# Patient Record
Sex: Female | Born: 1935
Health system: Southern US, Community
[De-identification: ages and names within clinical notes are randomized; demographics above are authoritative.]

## PROBLEM LIST (undated history)

## (undated) DIAGNOSIS — Z7901 Long term (current) use of anticoagulants: Secondary | ICD-10-CM

## (undated) DIAGNOSIS — L723 Sebaceous cyst: Secondary | ICD-10-CM

## (undated) DIAGNOSIS — M62838 Other muscle spasm: Secondary | ICD-10-CM

## (undated) DIAGNOSIS — R350 Frequency of micturition: Secondary | ICD-10-CM

## (undated) DIAGNOSIS — L989 Disorder of the skin and subcutaneous tissue, unspecified: Secondary | ICD-10-CM

## (undated) DIAGNOSIS — M791 Myalgia, unspecified site: Secondary | ICD-10-CM

## (undated) DIAGNOSIS — E119 Type 2 diabetes mellitus without complications: Secondary | ICD-10-CM

## (undated) DIAGNOSIS — Z5181 Encounter for therapeutic drug level monitoring: Secondary | ICD-10-CM

## (undated) DIAGNOSIS — F419 Anxiety disorder, unspecified: Secondary | ICD-10-CM

## (undated) DIAGNOSIS — K219 Gastro-esophageal reflux disease without esophagitis: Secondary | ICD-10-CM

## (undated) DIAGNOSIS — G4700 Insomnia, unspecified: Secondary | ICD-10-CM

## (undated) DIAGNOSIS — E538 Deficiency of other specified B group vitamins: Secondary | ICD-10-CM

## (undated) DIAGNOSIS — K5900 Constipation, unspecified: Secondary | ICD-10-CM

## (undated) DIAGNOSIS — I82409 Acute embolism and thrombosis of unspecified deep veins of unspecified lower extremity: Secondary | ICD-10-CM

## (undated) DIAGNOSIS — I10 Essential (primary) hypertension: Secondary | ICD-10-CM

## (undated) DIAGNOSIS — E559 Vitamin D deficiency, unspecified: Secondary | ICD-10-CM

## (undated) DIAGNOSIS — E785 Hyperlipidemia, unspecified: Secondary | ICD-10-CM

## (undated) HISTORY — DX: Other muscle spasm: M62.838

## (undated) HISTORY — DX: Sebaceous cyst: L72.3

## (undated) HISTORY — DX: Vitamin D deficiency, unspecified: E55.9

## (undated) HISTORY — DX: Deficiency of other specified B group vitamins: E53.8

## (undated) HISTORY — DX: Essential (primary) hypertension: I10

## (undated) HISTORY — DX: Constipation, unspecified: K59.00

## (undated) HISTORY — DX: Myalgia, unspecified site: M79.10

## (undated) HISTORY — DX: Disorder of the skin and subcutaneous tissue, unspecified: L98.9

## (undated) HISTORY — DX: Gastro-esophageal reflux disease without esophagitis: K21.9

## (undated) HISTORY — DX: Type 2 diabetes mellitus without complications: E11.9

## (undated) HISTORY — DX: Hyperlipidemia, unspecified: E78.5

## (undated) HISTORY — DX: Encounter for therapeutic drug level monitoring: Z51.81

## (undated) HISTORY — PX: ABDOMINAL HYSTERECTOMY: SHX81

## (undated) HISTORY — DX: Frequency of micturition: R35.0

## (undated) HISTORY — DX: Long term (current) use of anticoagulants: Z79.01

## (undated) HISTORY — DX: Acute embolism and thrombosis of unspecified deep veins of unspecified lower extremity: I82.409

## (undated) HISTORY — DX: Anxiety disorder, unspecified: F41.9

## (undated) HISTORY — DX: Insomnia, unspecified: G47.00

---

## 2003-09-22 IMAGING — CR DG CHEST 1V PORT
1 series · 1 of 1 positions shown · non-contrast
Comparison: none

REASON FOR EXAM: Chest pain
COMMENTS:  LMP: Post-Menopausal

PROCEDURE:     DXR - DXR PORTABLE CHEST SINGLE VIEW  - [DATE]  [DATE]
RESULT:          The lungs are clear.  The cardiac silhouette and visualized
bony skeleton are unremarkable.

[view not recorded]
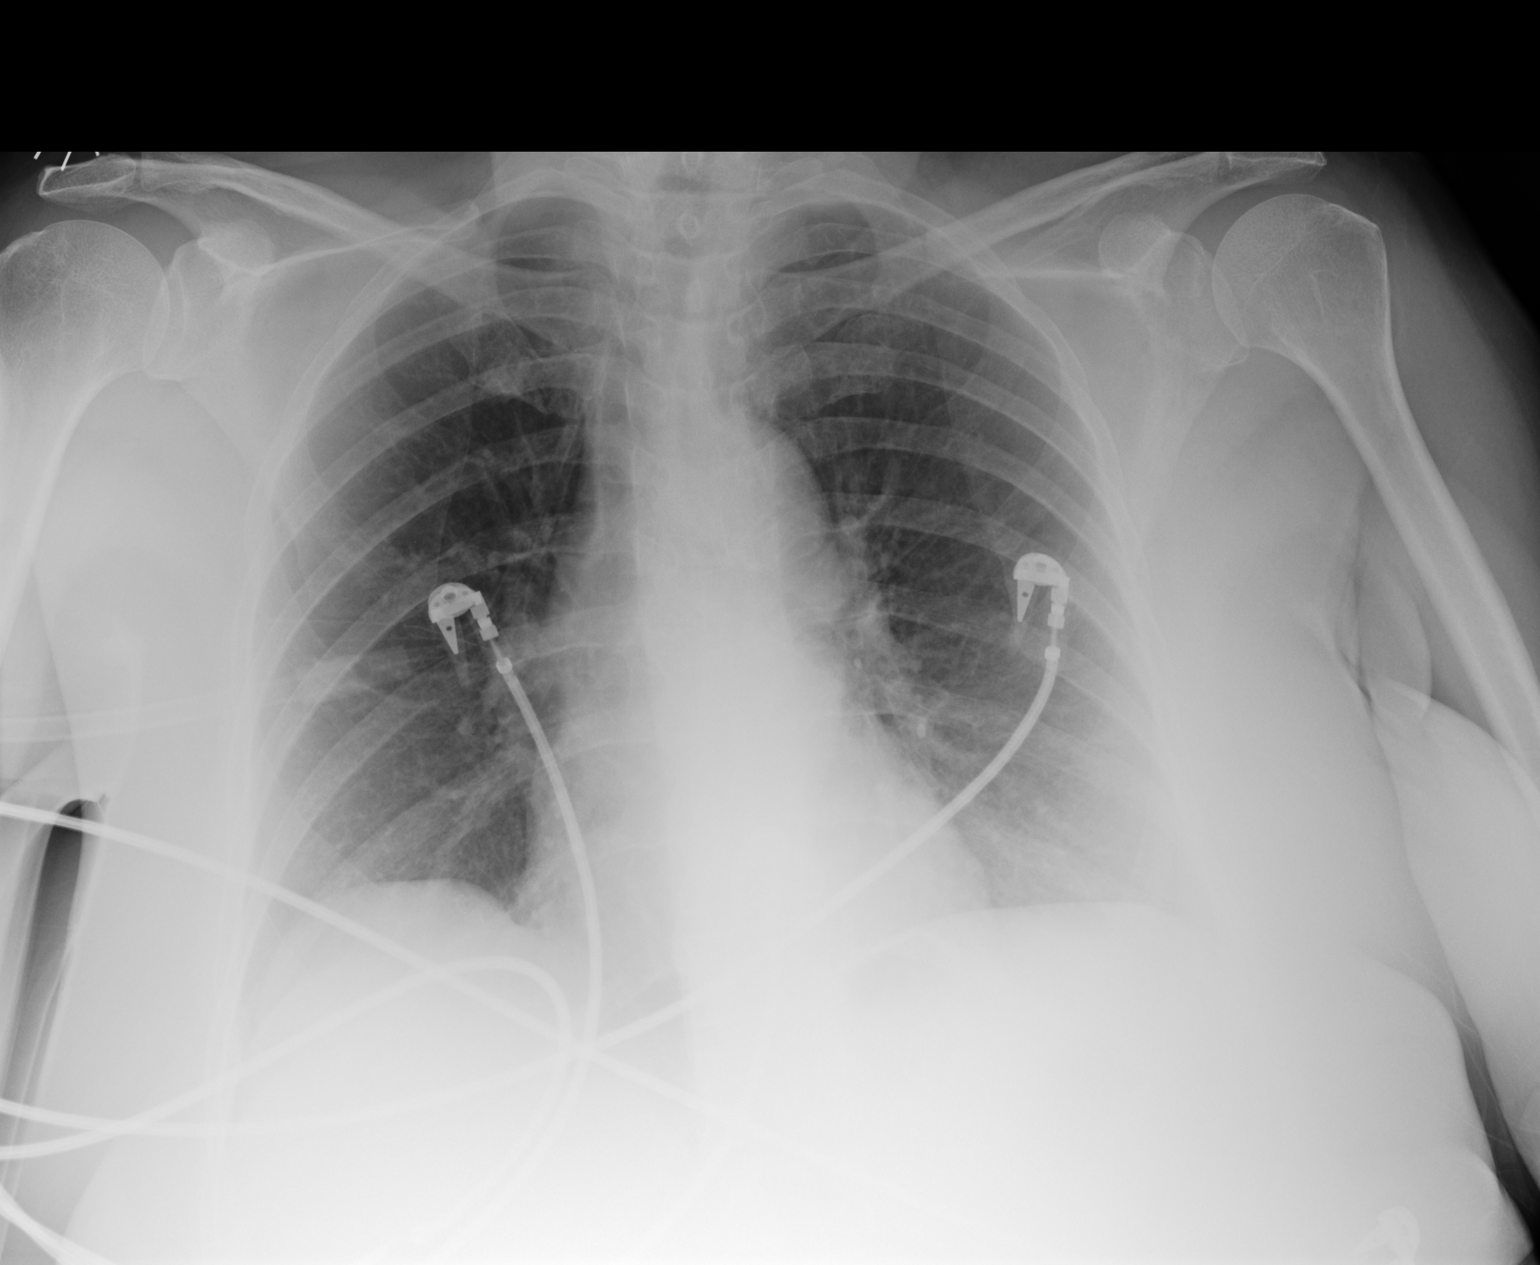

[1 of 1 positions shown; findings below may reference images not displayed]

IMPRESSION: Chest radiograph without evidence of acute
cardiopulmonary disease.

## 2004-03-31 DIAGNOSIS — I82409 Acute embolism and thrombosis of unspecified deep veins of unspecified lower extremity: Secondary | ICD-10-CM

## 2004-03-31 HISTORY — DX: Acute embolism and thrombosis of unspecified deep veins of unspecified lower extremity: I82.409

## 2004-09-02 ENCOUNTER — Ambulatory Visit: Payer: Self-pay | Admitting: Anesthesiology

## 2004-09-28 ENCOUNTER — Ambulatory Visit: Payer: Self-pay | Admitting: Anesthesiology

## 2005-02-11 ENCOUNTER — Ambulatory Visit: Payer: Self-pay | Admitting: Anesthesiology

## 2005-02-28 ENCOUNTER — Ambulatory Visit: Payer: Self-pay | Admitting: Anesthesiology

## 2005-05-31 ENCOUNTER — Other Ambulatory Visit: Payer: Self-pay

## 2005-05-31 ENCOUNTER — Emergency Department: Payer: Self-pay | Admitting: Emergency Medicine

## 2005-05-31 IMAGING — CT CT ABDOMEN AND PELVIS WITHOUT AND WITH CONTRAST
1 of 3 series · 12 of 32 positions shown, 18 images · non-contrast
Comparison: none

REASON FOR EXAM: LEFT-sided pain
COMMENTS:

[Series 4: delay · axial · delayed · 0.87mm/px · z∈[-496,-32]mm · 12 of 70 slices shown, 18 images]
[im 6/70  soft-tissue]
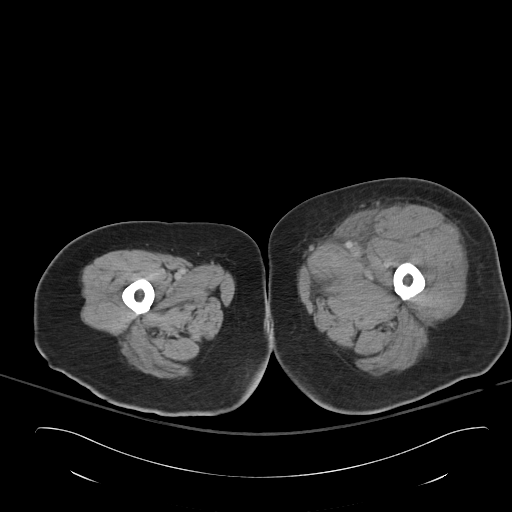
[im 6/70  bone]
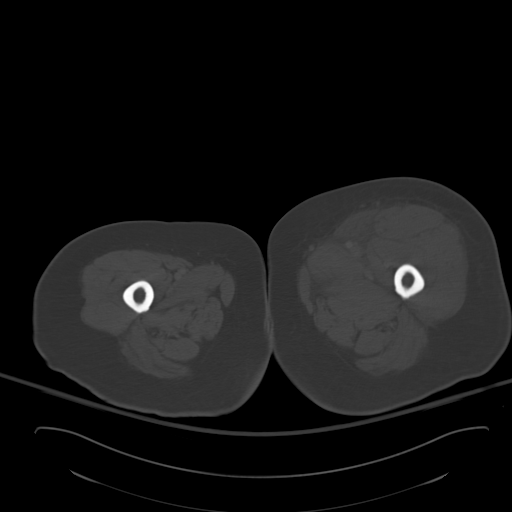
[im 11/70  soft-tissue]
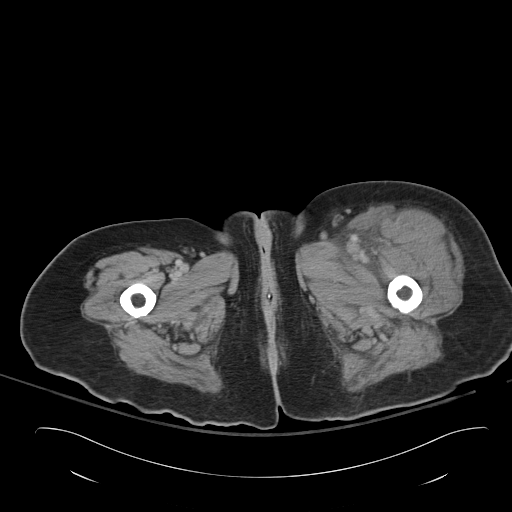
[im 16/70  soft-tissue]
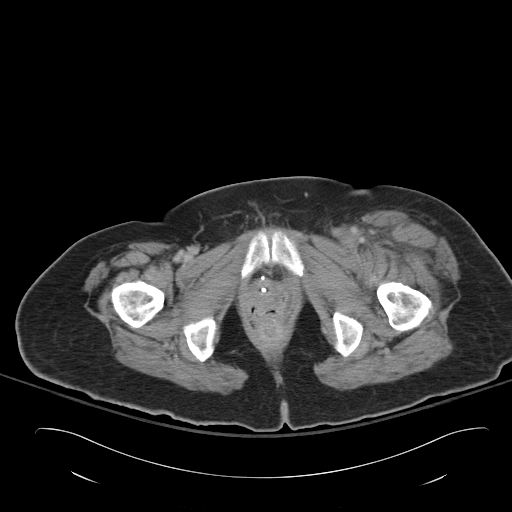
[im 22/70  soft-tissue]
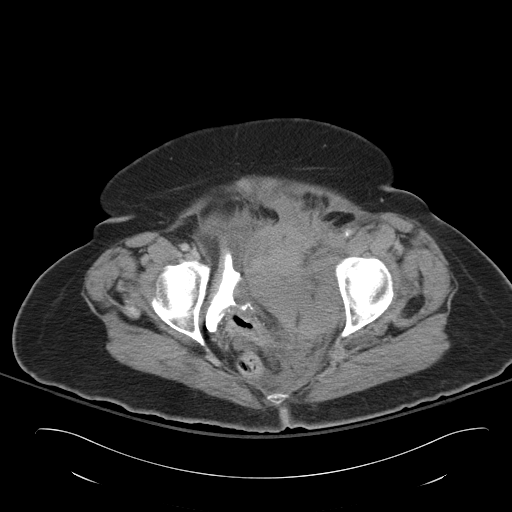
[im 27/70  soft-tissue]
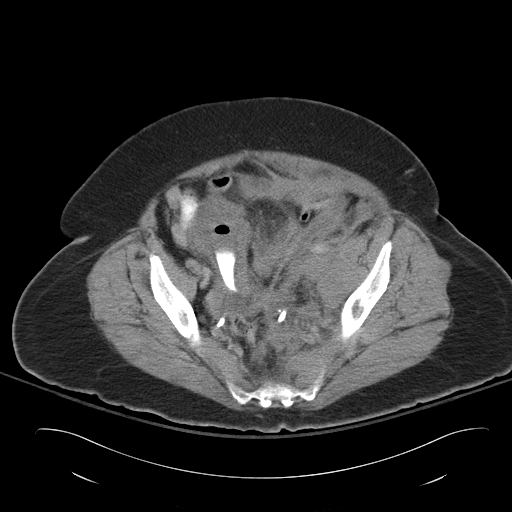
[im 32/70  soft-tissue]
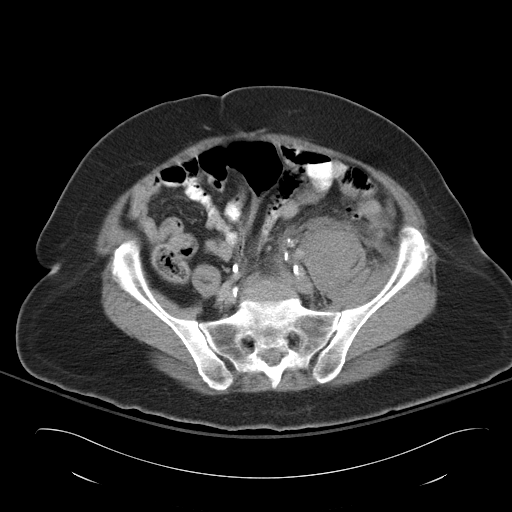
[im 38/70  soft-tissue]
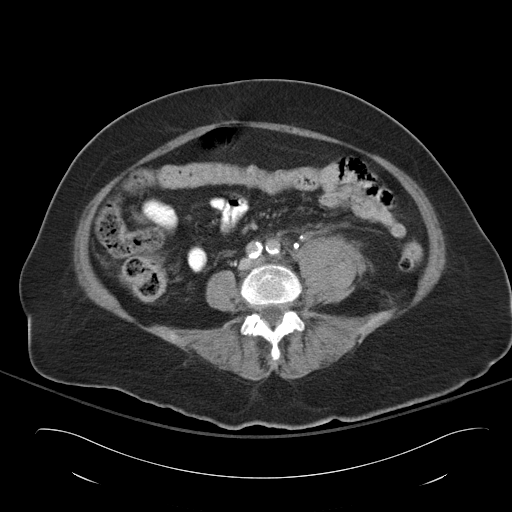
[im 43/70  soft-tissue]
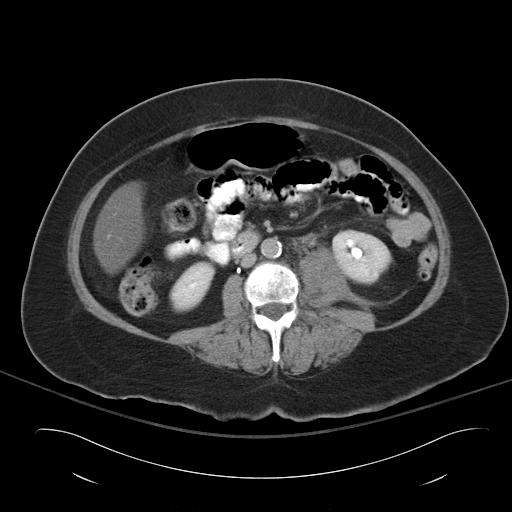
[im 48/70  soft-tissue]
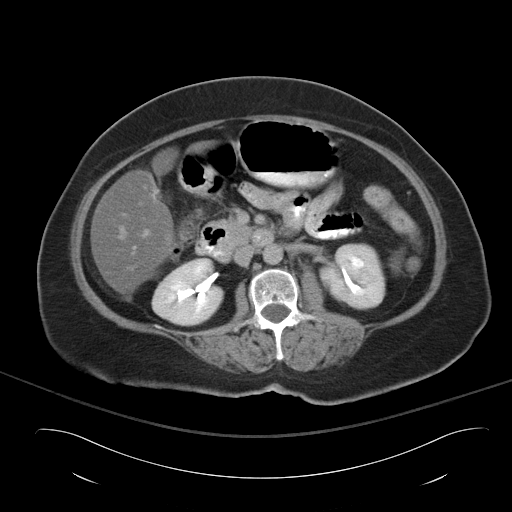
[im 48/70  lung]
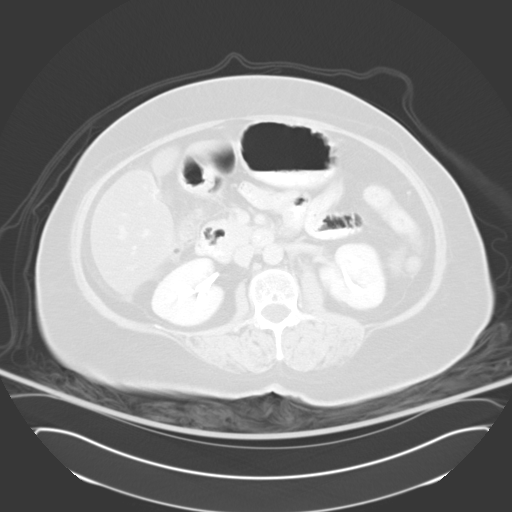
[im 48/70  bone]
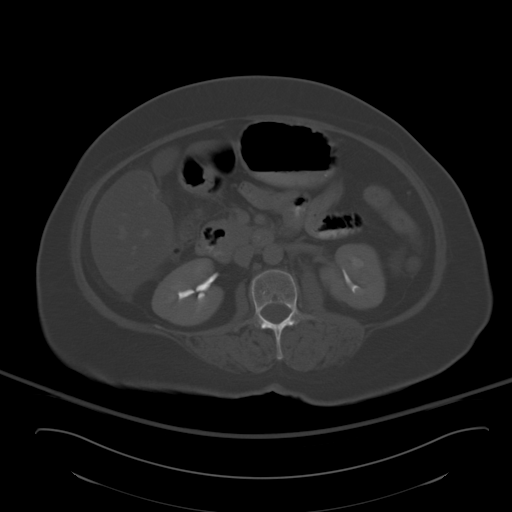
[im 54/70  soft-tissue]
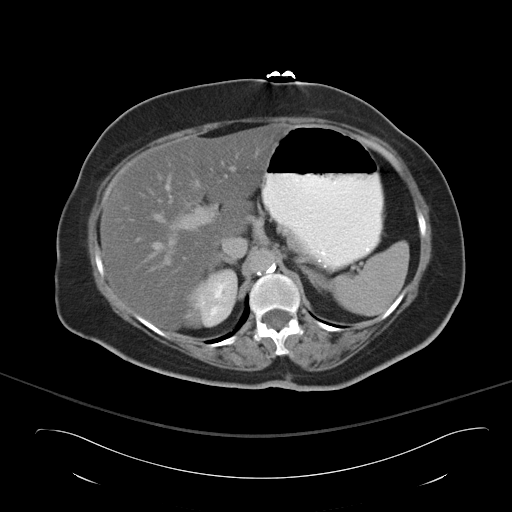
[im 54/70  lung]
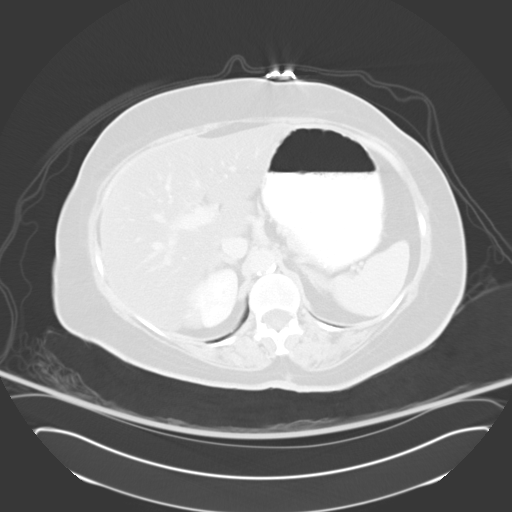
[im 59/70  soft-tissue]
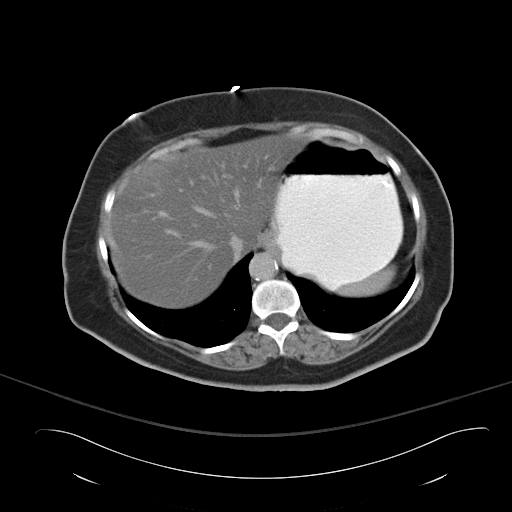
[im 59/70  lung]
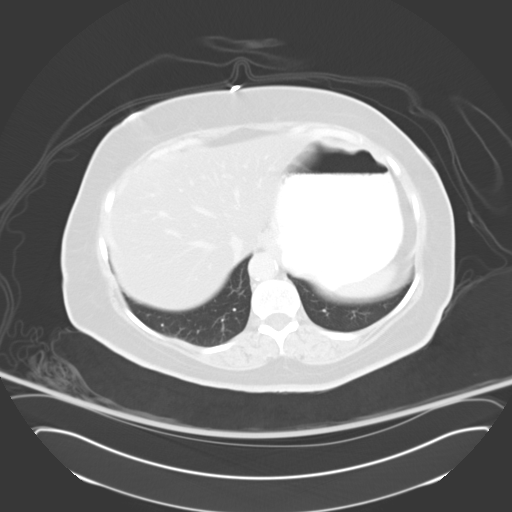
[im 64/70  soft-tissue]
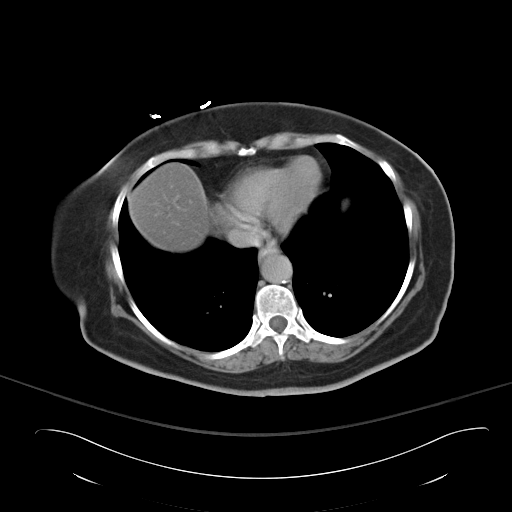
[im 64/70  lung]
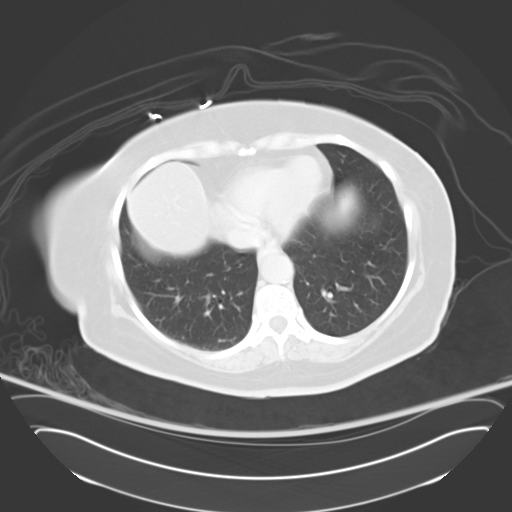

[12 of 32 positions shown; findings below may reference images not displayed]

PROCEDURE:     CT  - CT ABDOMEN / PELVIS  W/WO  - [DATE] [DATE]

RESULT:          Spiral 8 mm sections were obtained from the lung bases
through the upper thighs pre, immediate and delay intravenous administration
of 100 ml of [E5] and oral contrast.

Evaluation of the liver demonstrates a diffuse low-attenuating parenchymal
pattern.  Small, focal punctate areas of air project within the region of
the porta hepatis, extending out into the proximal biliary duct region,
primarily in the LEFT lobe.  These foci have an appearance of refluxed gas
into the bile ductal system.  This gas does not have the appearance of
portal venous gas, though correlation with the patient's clinical status is
recommended.  No liver masses are identified.  The spleen, adrenals,
pancreas, and kidneys are unremarkable.  There is diffuse enlargement of the
LEFT psoas muscle with surrounding stranding in the surrounding mesenteric
fat.  The diffuse enlargement extends into the pelvis and involves the
iliopsoas muscle.  A hyperdense mass is appreciated within the pelvis
measuring approximately 10.2 x 8.7 cm.  This extends into the anterior
pelvic wall on the LEFT.  There also appears to be extension into the LEFT
upper thigh.  Fluid and stranding is appreciated within the surrounding
mesenteric fat of the pelvis.  This mass causes compression and lateral
displacement of the urinary bladder as well as the bowel within the pelvis.
IMPRESSION: 1.     Findings which appear to be consistent with a retroperitoneal
hematoma involving the psoas, iliopsoas, and upper thigh.  The bulk of the
hematoma projects within the pelvis as described above.  Whether this is a
spontaneous hematoma versus hemorrhage within a mass is difficult to
ascertain from this study.  The origin of a mass may come from stromal
elements such as a sarcoma or possibly even from lower intraperitoneal
elements such as bowel, particularly considering the mass effect upon the
urinary bladder with subsequent extension of hemorrhage into the
retroperitoneal regions.  Surgical consultation is recommended.
2.     Findings which appear to be consistent with mass within the
intrahepatic biliary tree.  This does not have the appearance at this time
of portal venous gas, and again correlation with the patient's clinical
status is recommended.  The punctate areas of gas in the region of the porta
hepatis cannot be totally excluded to represent an element of free air as
described above.
3.     Hepatic steatosis.

## 2005-05-31 IMAGING — CT CT ABDOMEN AND PELVIS WITHOUT AND WITH CONTRAST
1 of 3 series · 14 of 32 positions shown, 18 images · non-contrast
Comparison: none

REASON FOR EXAM: LEFT-sided pain
COMMENTS:

[Series 2: stone · axial · 0.84mm/px · z∈[-440,-22]mm · 14 of 153 slices shown, 18 images]
[im 7/153  soft-tissue]
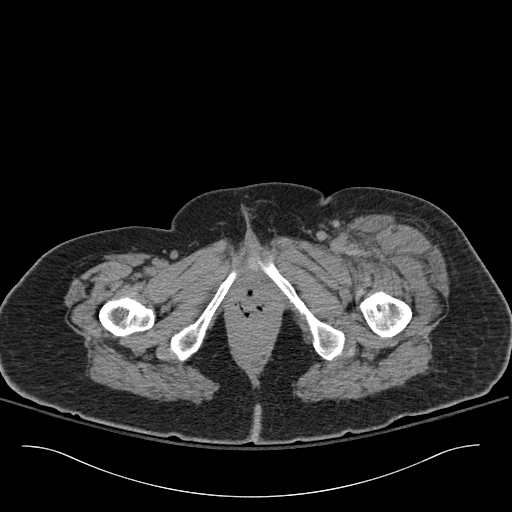
[im 7/153  bone]
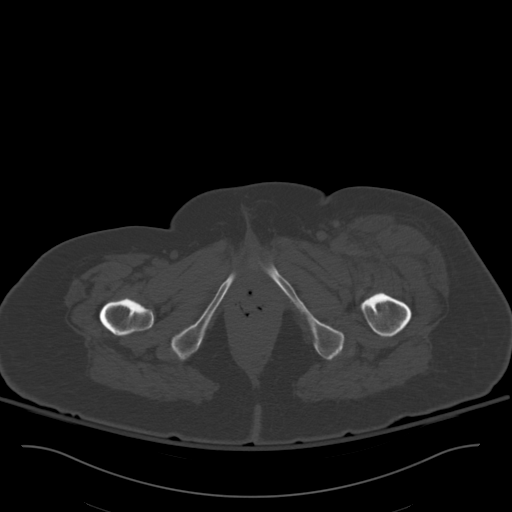
[im 20/153  soft-tissue]
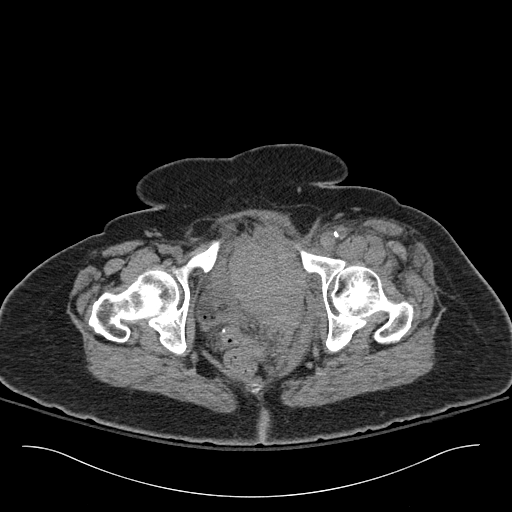
[im 32/153  soft-tissue]
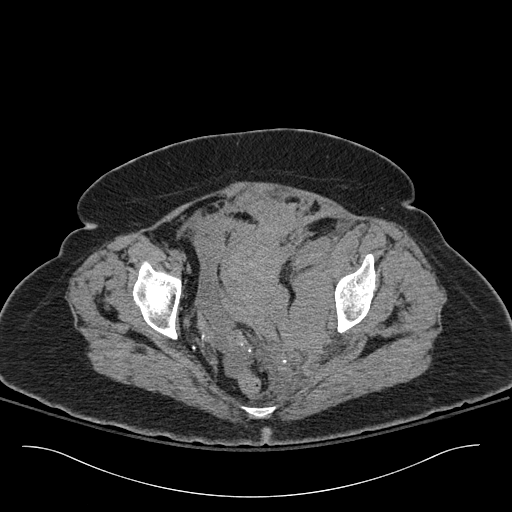
[im 45/153  soft-tissue]
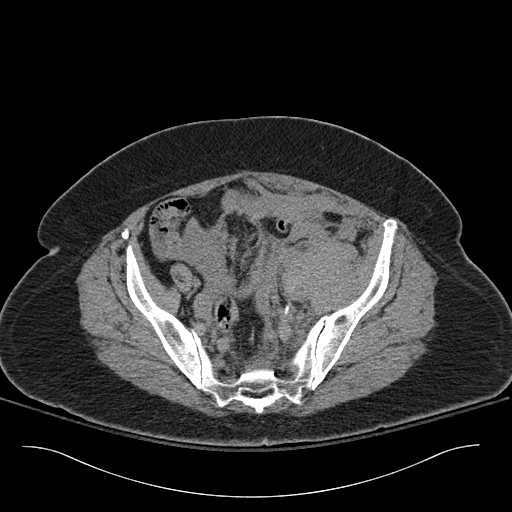
[im 58/153  soft-tissue]
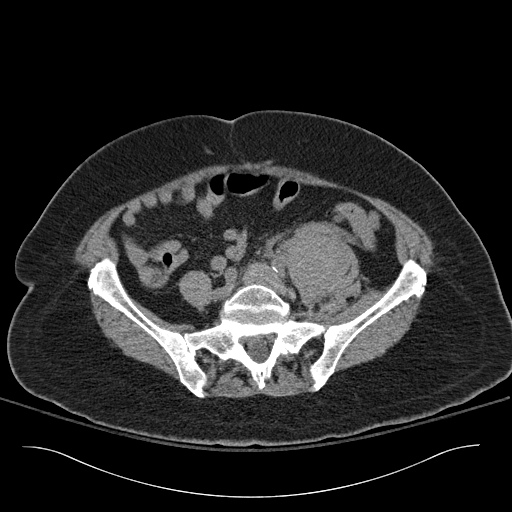
[im 70/153  soft-tissue]
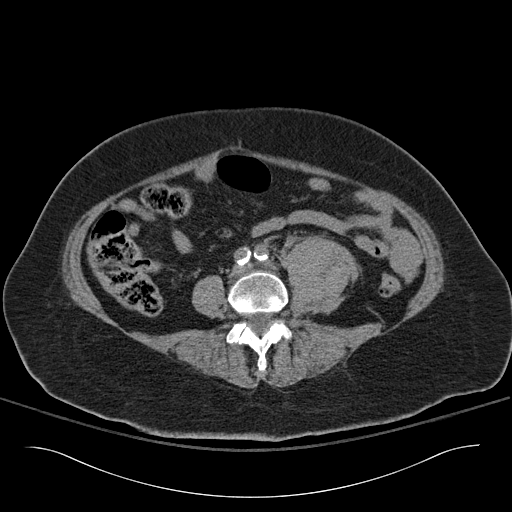
[im 83/153  soft-tissue]
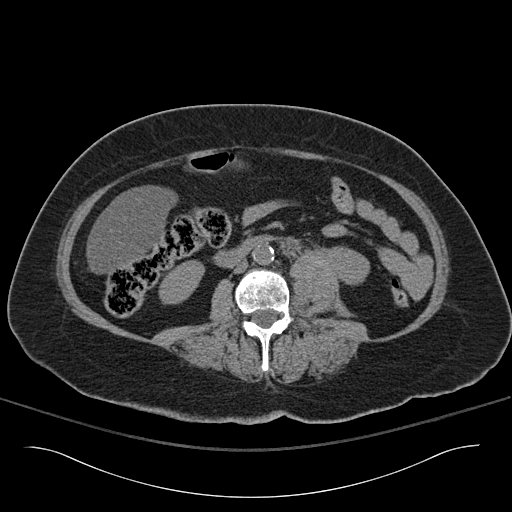
[im 96/153  soft-tissue]
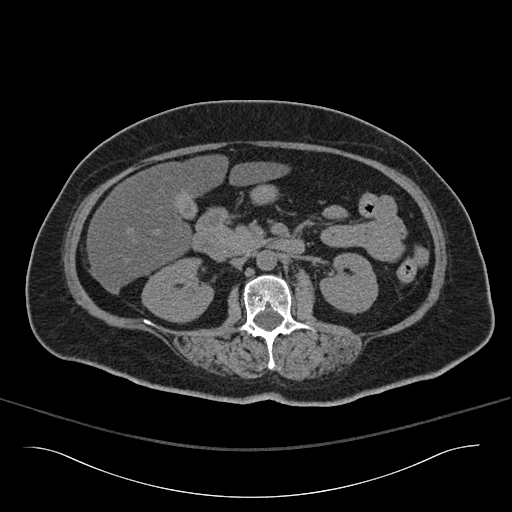
[im 108/153  soft-tissue]
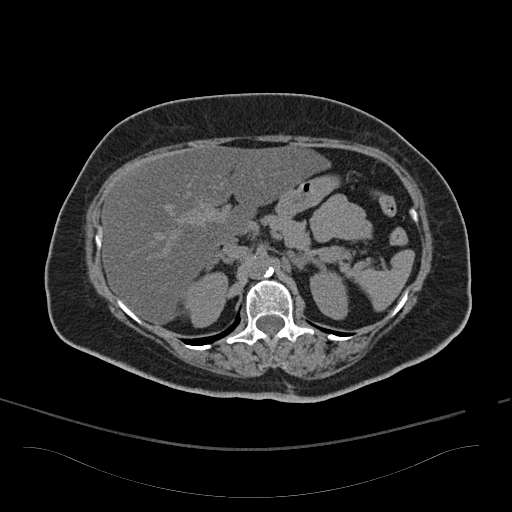
[im 108/153  bone]
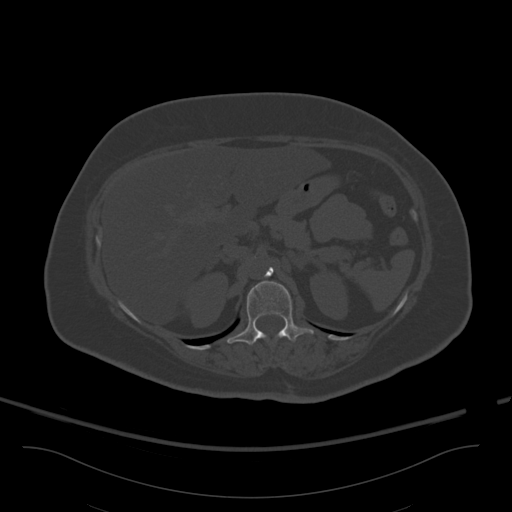
[im 121/153  soft-tissue]
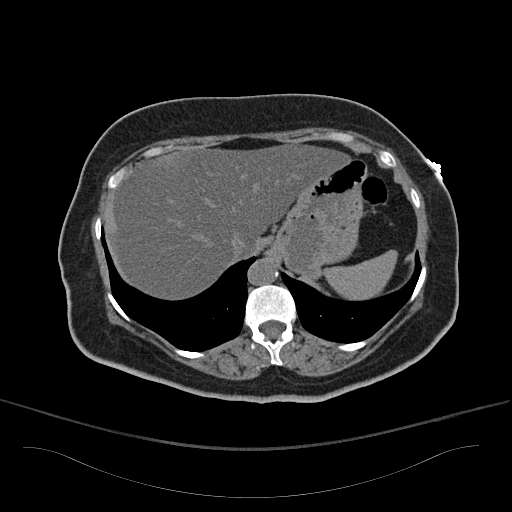
[im 127/153  lung]
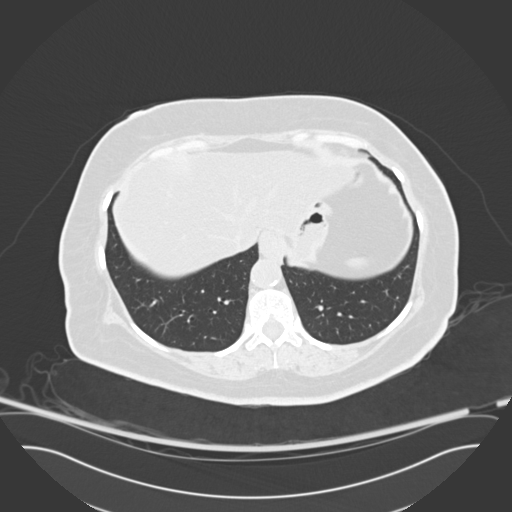
[im 134/153  soft-tissue]
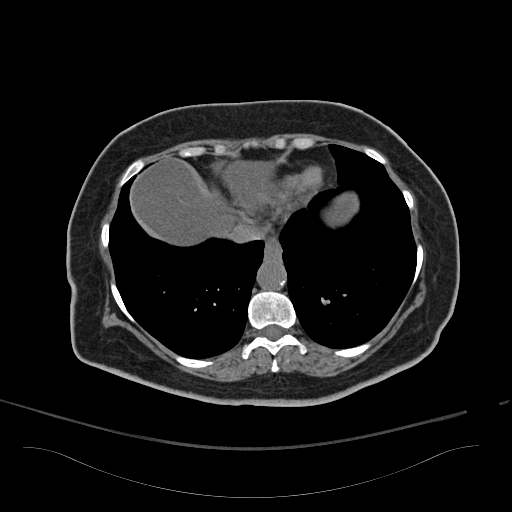
[im 134/153  lung]
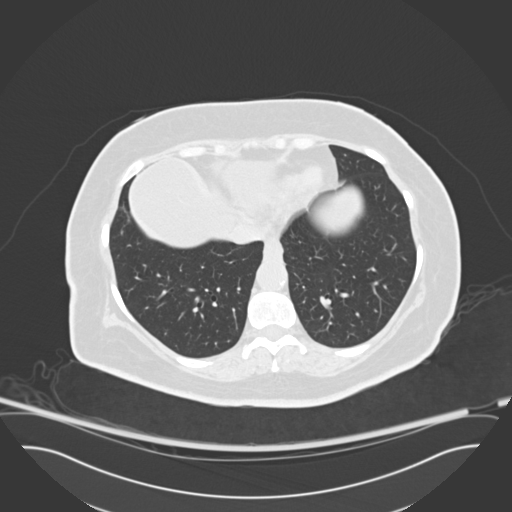
[im 140/153  lung]
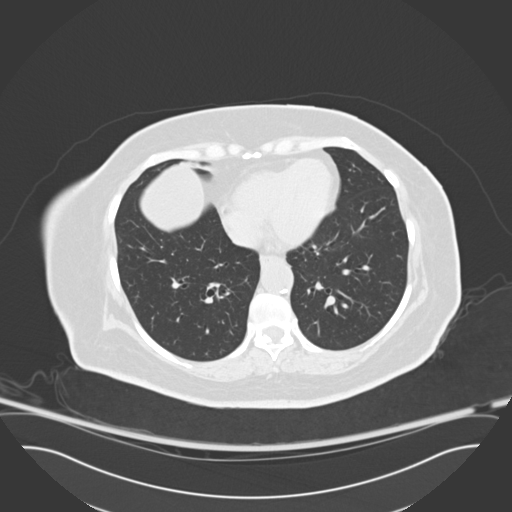
[im 146/153  soft-tissue]
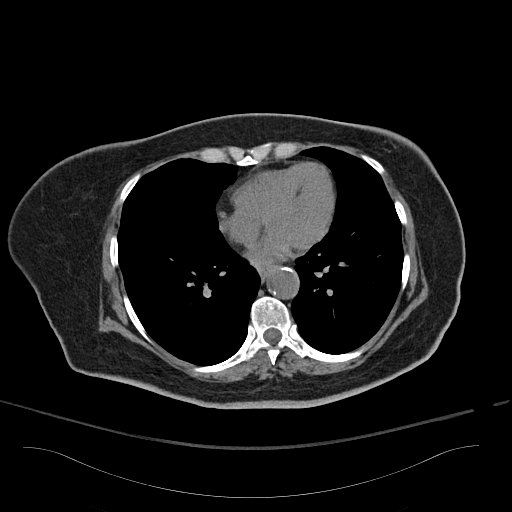
[im 146/153  lung]
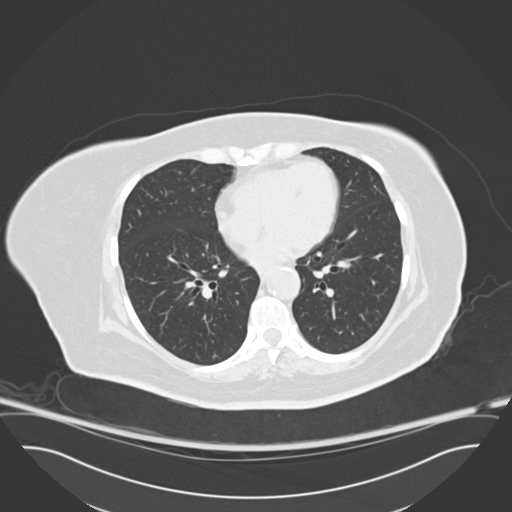

[14 of 32 positions shown; findings below may reference images not displayed]

PROCEDURE:     CT  - CT ABDOMEN / PELVIS  W/WO  - [DATE] [DATE]

RESULT:          Spiral 8 mm sections were obtained from the lung bases
through the upper thighs pre, immediate and delay intravenous administration
of 100 ml of [E5] and oral contrast.

Evaluation of the liver demonstrates a diffuse low-attenuating parenchymal
pattern.  Small, focal punctate areas of air project within the region of
the porta hepatis, extending out into the proximal biliary duct region,
primarily in the LEFT lobe.  These foci have an appearance of refluxed gas
into the bile ductal system.  This gas does not have the appearance of
portal venous gas, though correlation with the patient's clinical status is
recommended.  No liver masses are identified.  The spleen, adrenals,
pancreas, and kidneys are unremarkable.  There is diffuse enlargement of the
LEFT psoas muscle with surrounding stranding in the surrounding mesenteric
fat.  The diffuse enlargement extends into the pelvis and involves the
iliopsoas muscle.  A hyperdense mass is appreciated within the pelvis
measuring approximately 10.2 x 8.7 cm.  This extends into the anterior
pelvic wall on the LEFT.  There also appears to be extension into the LEFT
upper thigh.  Fluid and stranding is appreciated within the surrounding
mesenteric fat of the pelvis.  This mass causes compression and lateral
displacement of the urinary bladder as well as the bowel within the pelvis.
IMPRESSION: 1.     Findings which appear to be consistent with a retroperitoneal
hematoma involving the psoas, iliopsoas, and upper thigh.  The bulk of the
hematoma projects within the pelvis as described above.  Whether this is a
spontaneous hematoma versus hemorrhage within a mass is difficult to
ascertain from this study.  The origin of a mass may come from stromal
elements such as a sarcoma or possibly even from lower intraperitoneal
elements such as bowel, particularly considering the mass effect upon the
urinary bladder with subsequent extension of hemorrhage into the
retroperitoneal regions.  Surgical consultation is recommended.
2.     Findings which appear to be consistent with mass within the
intrahepatic biliary tree.  This does not have the appearance at this time
of portal venous gas, and again correlation with the patient's clinical
status is recommended.  The punctate areas of gas in the region of the porta
hepatis cannot be totally excluded to represent an element of free air as
described above.
3.     Hepatic steatosis.

## 2005-05-31 IMAGING — CR DG HIP COMPLETE 2+V*L*
1 series · 2 of 2 positions shown · non-contrast
Comparison: none

REASON FOR EXAM: Left sided hip pain
COMMENTS:  LMP: Post-Menopausal

PROCEDURE:     DXR - DXR HIP LEFT COMPLETE  - [DATE] [DATE]
RESULT:          There does not appear to be evidence of fracture,
dislocation, or malalignment.  There are mild degenerative changes
appreciated involving the LEFT femoroacetabular joint.

[Series 1: view not recorded · 0.17mm/px · 2 of 2 slices shown]
[im 1/2]
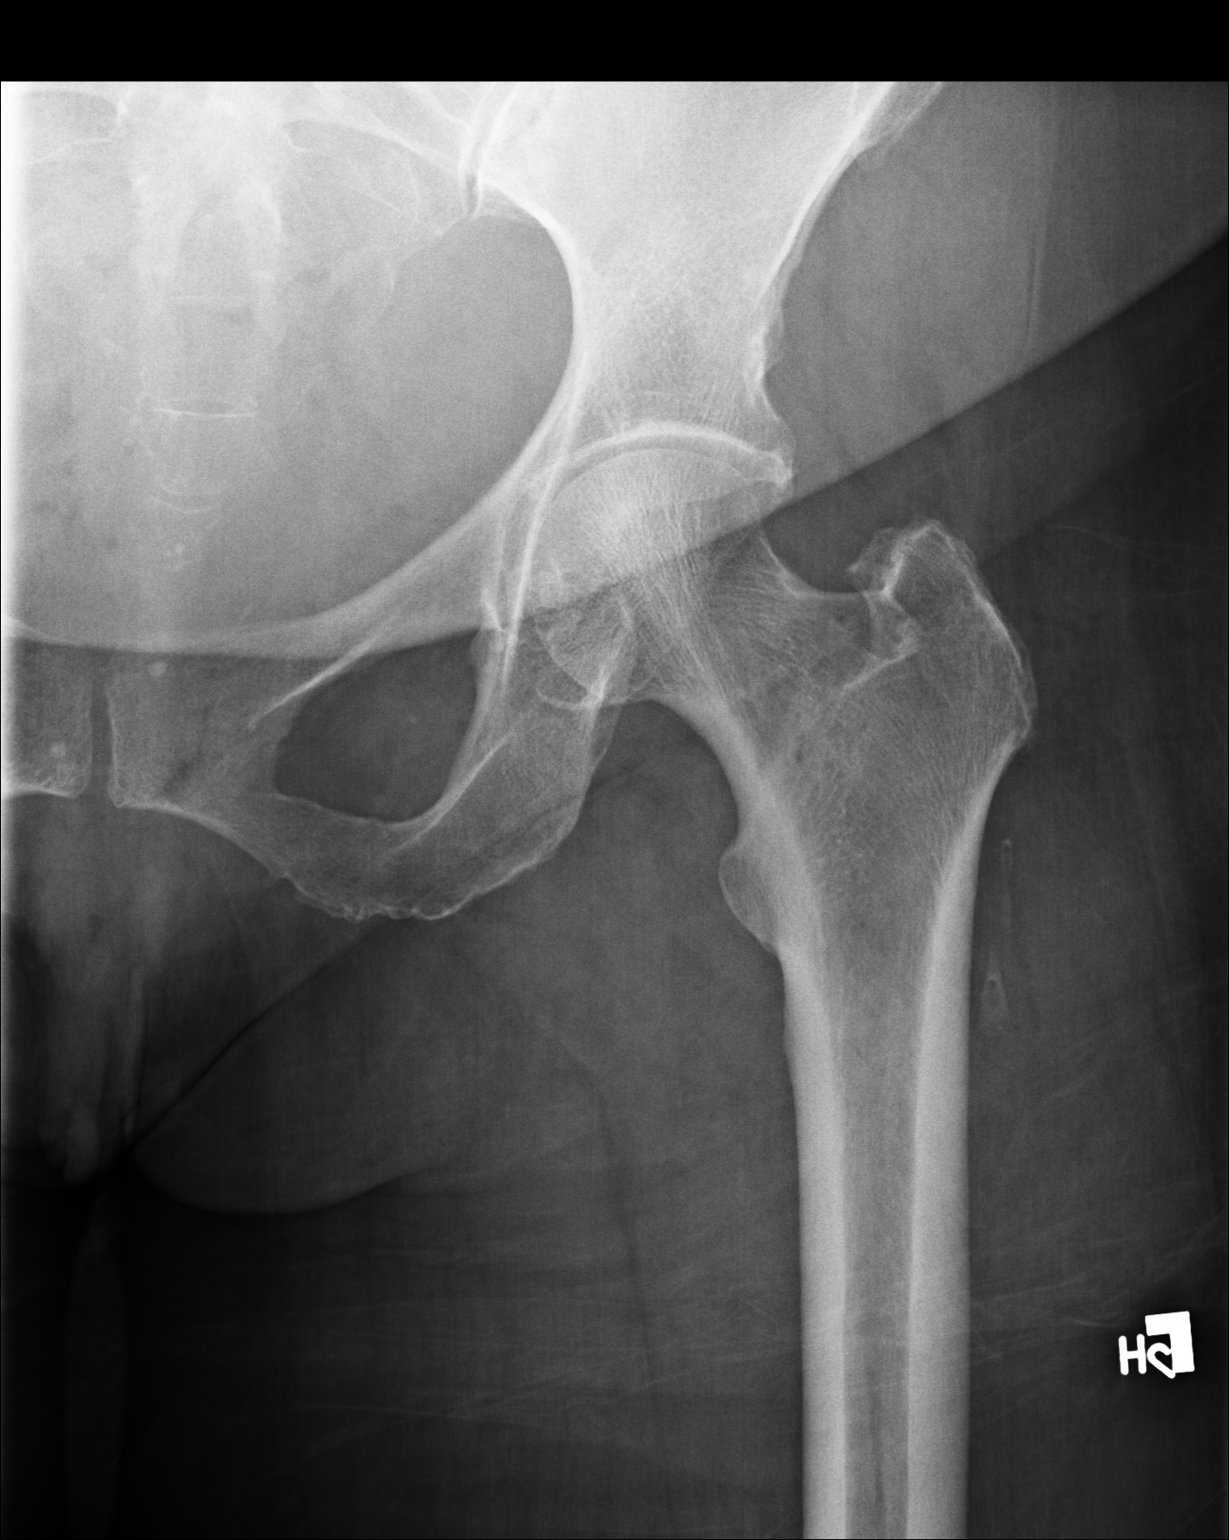
[im 2/2]
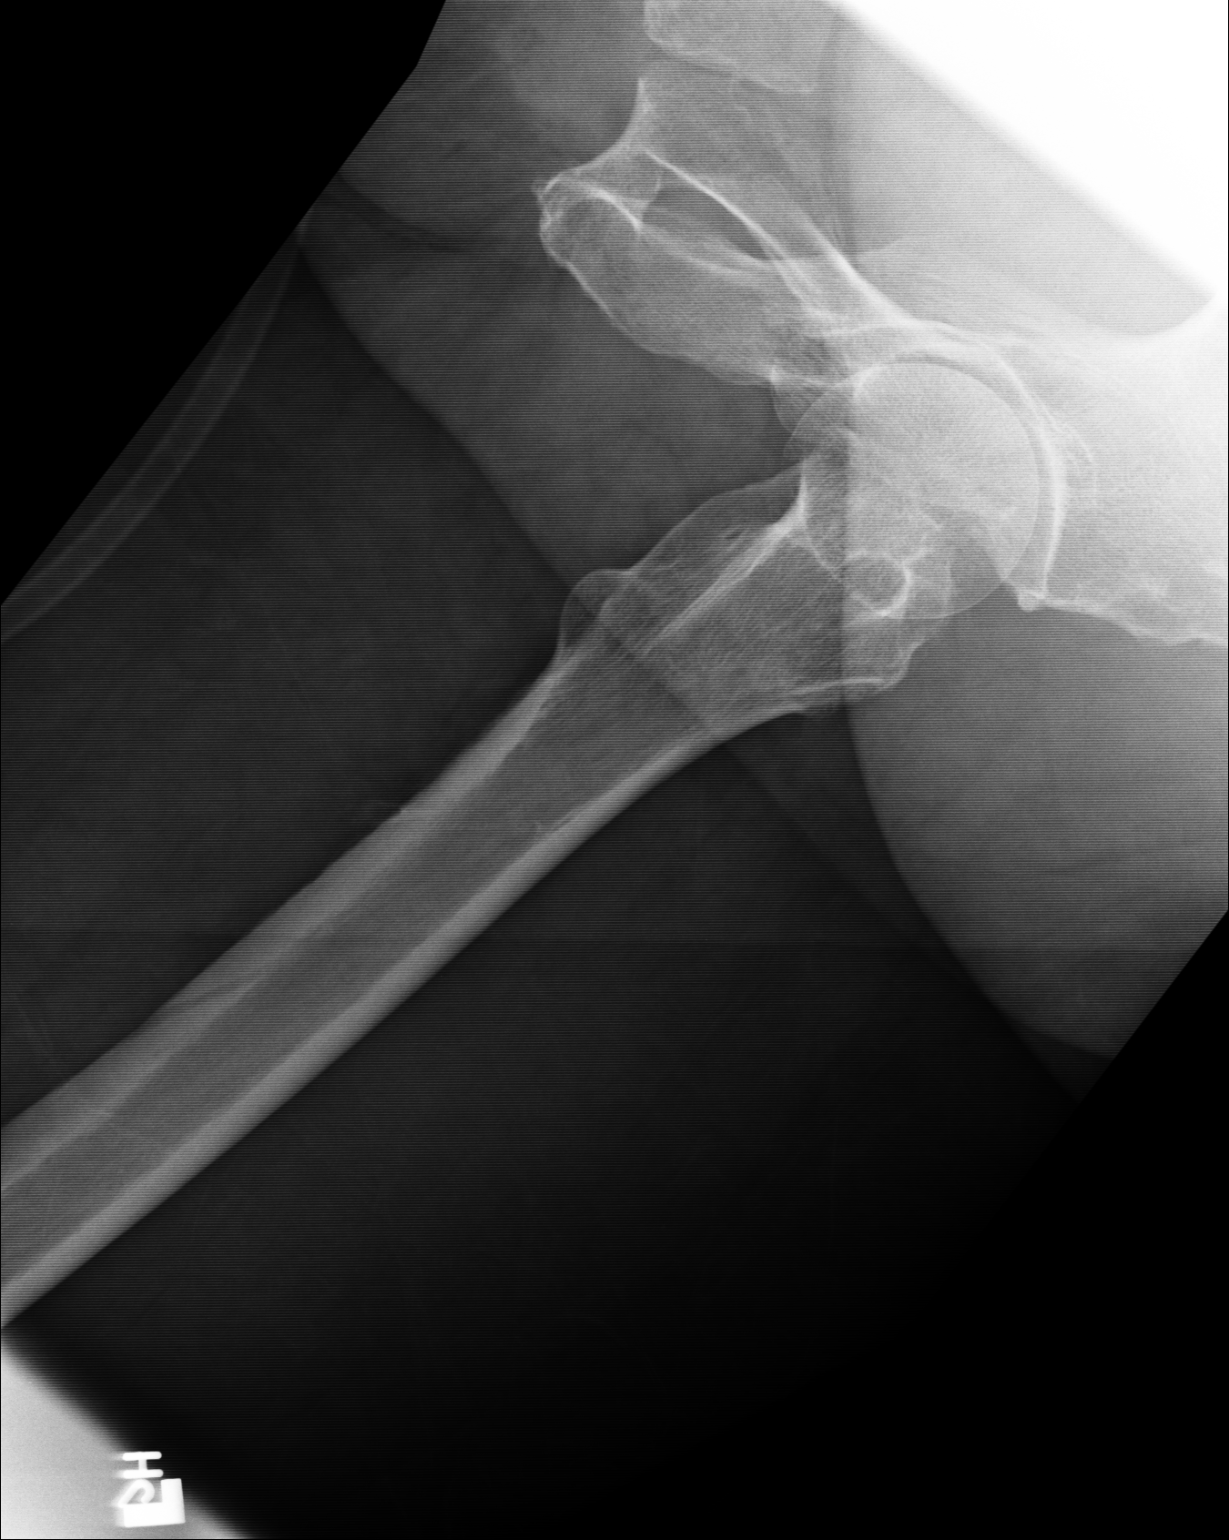

[2 of 2 positions shown; findings below may reference images not displayed]

IMPRESSION: No evidence of acute abnormalities.  If there are
persistent complaints of pain or persistent clinical concern, repeat
evaluation in 7-10 days is recommended, or further evaluation with hip
fracture protocol MRI.

## 2005-05-31 IMAGING — CR PELVIS - 1-2 VIEW
1 series · 1 of 1 positions shown · non-contrast
Comparison: none

REASON FOR EXAM: Pelvis pain
COMMENTS:  LMP: Post-Menopausal

PROCEDURE:     DXR - DXR PELVIS AP ONLY  - [DATE] [DATE]
RESULT:          The bones are osteopenic. Otherwise, there is no evidence
of fracture, dislocation, or malalignment.  Degenerative changes are
appreciated involving the L5-S1 disc space level.

[view not recorded]
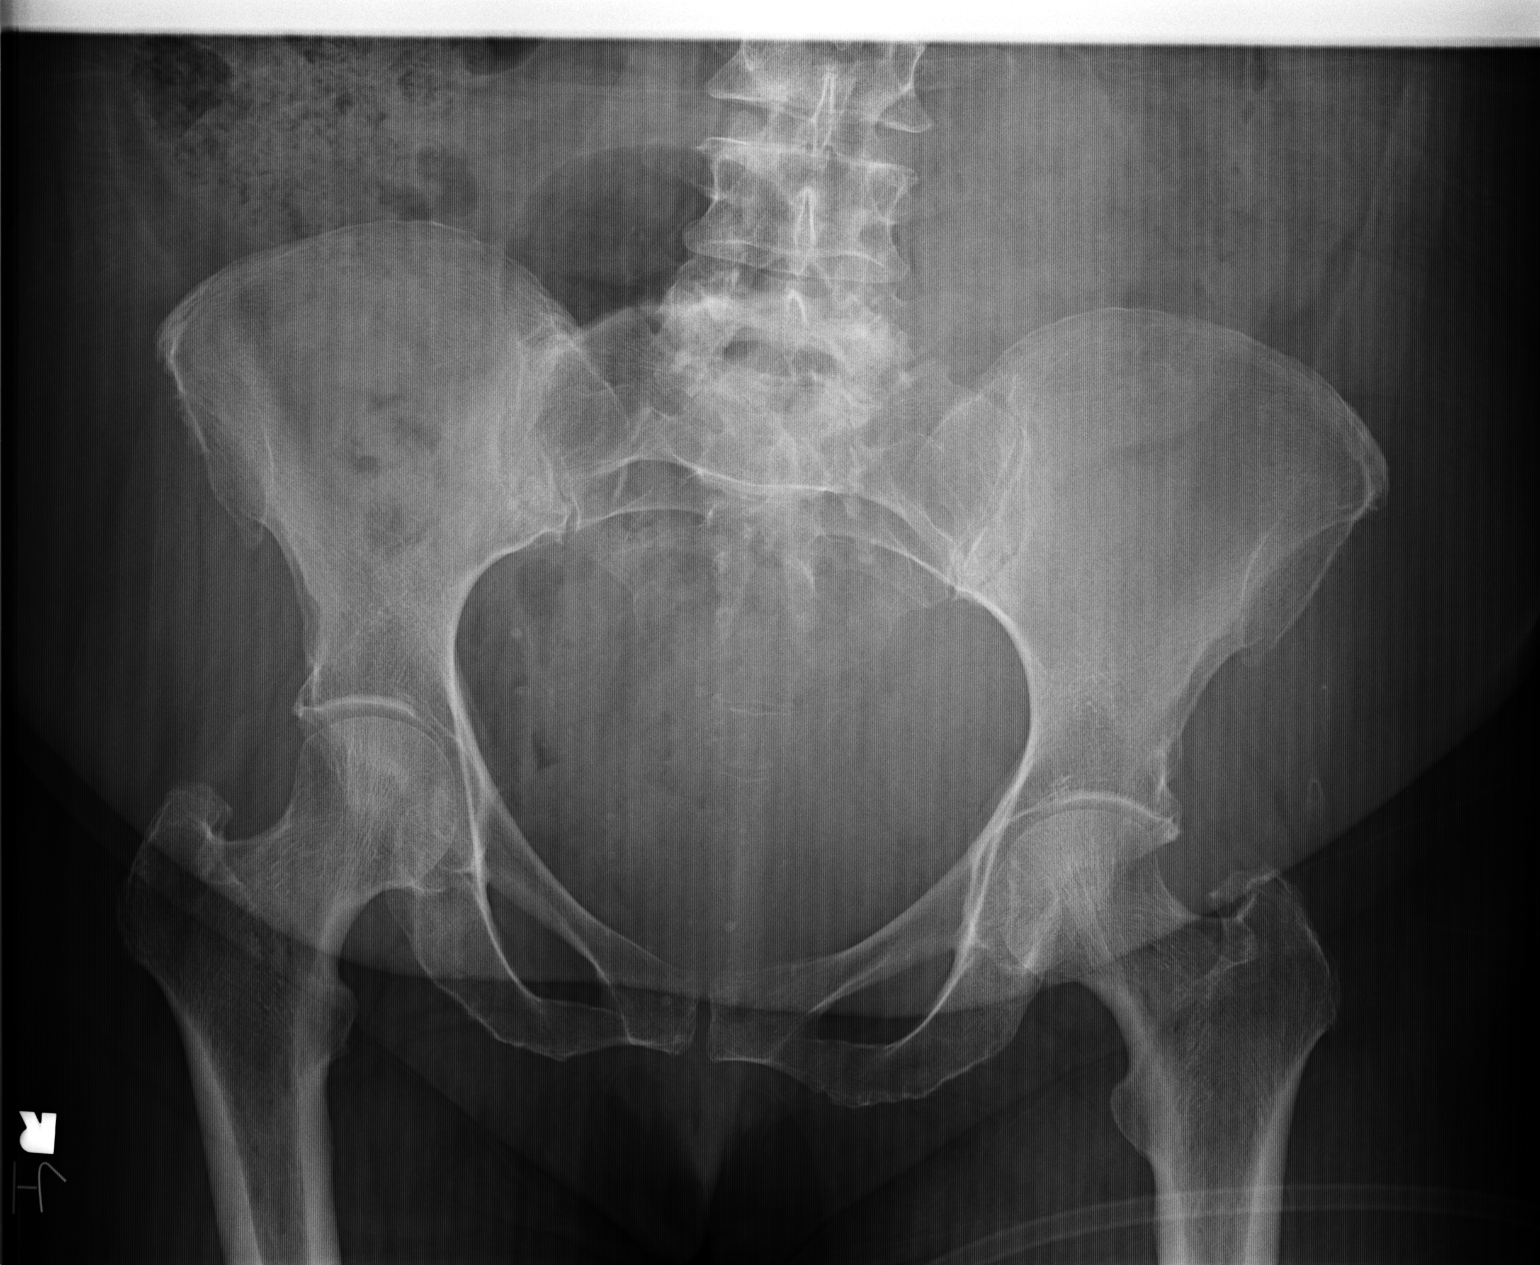

[1 of 1 positions shown; findings below may reference images not displayed]

IMPRESSION: Osteopenia without evidence of acute fracture or
dislocation.  If there are persistent complaints of pain or persistent
clinical concern, repeat evaluation in 7-10 days is recommended if
clinically warranted.

## 2005-05-31 IMAGING — US US EXTREM LOW VENOUS*L*
1 series · 17 of 24 positions shown · non-contrast
Comparison: none

REASON FOR EXAM: LEFT leg swelling
COMMENTS:  LMP: Post-Menopausal

[Series 1: us extrem low venous*left* · 17 of 35 slices shown]
[im 1/35]
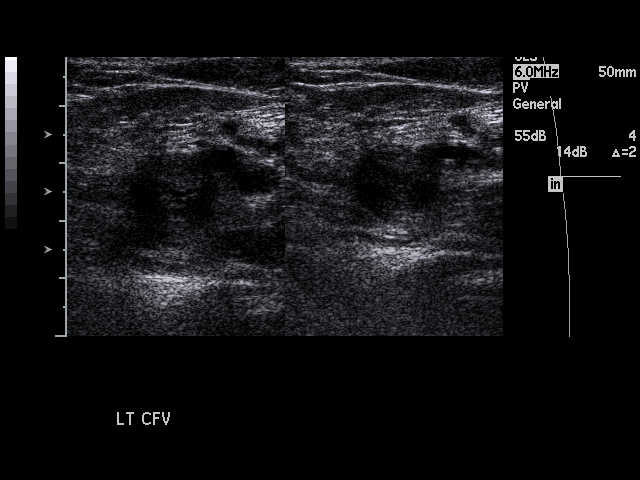
[im 3/35]
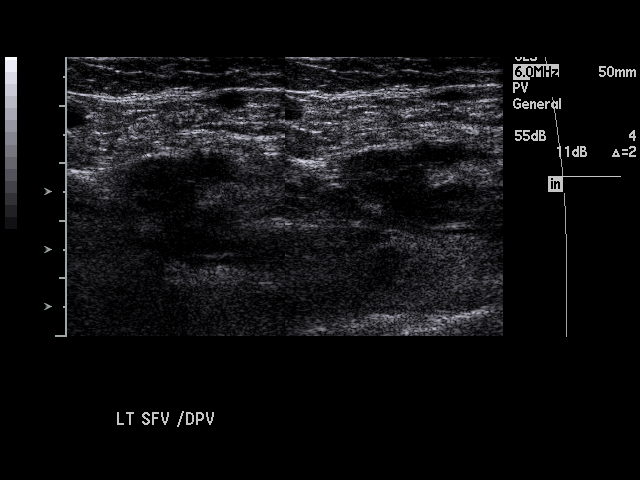
[im 5/35]
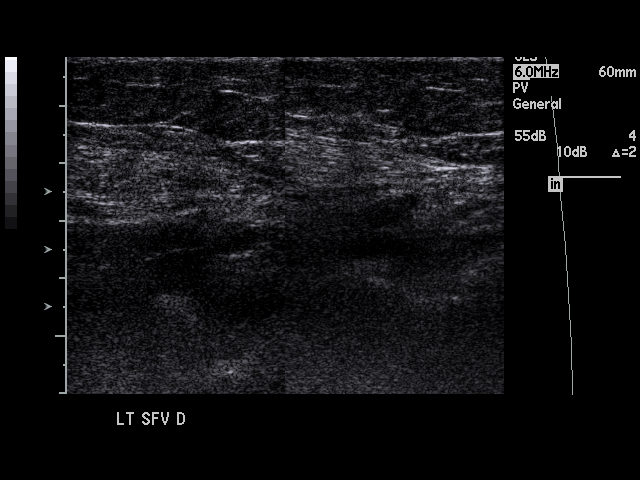
[im 6/35]
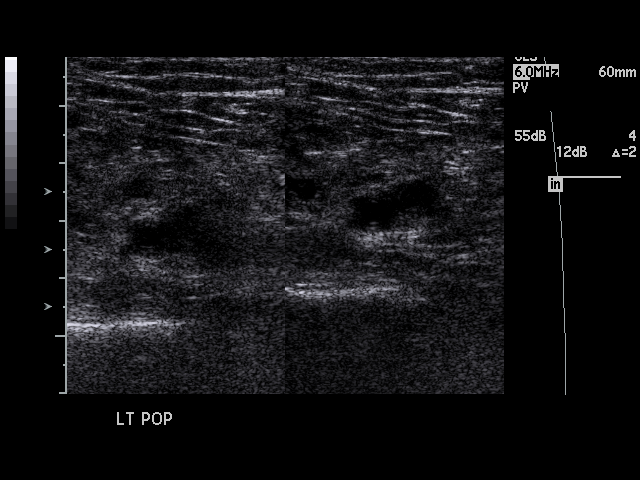
[im 9/35]
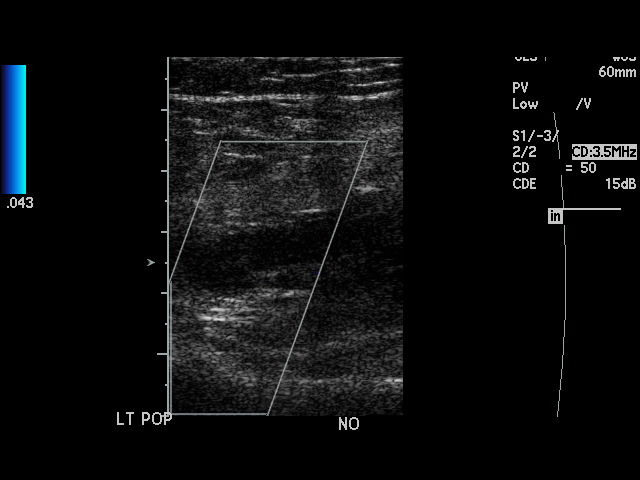
[im 11/35]
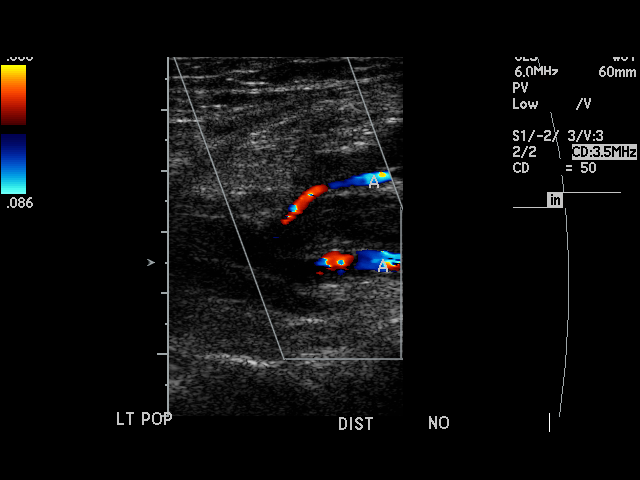
[im 14/35]
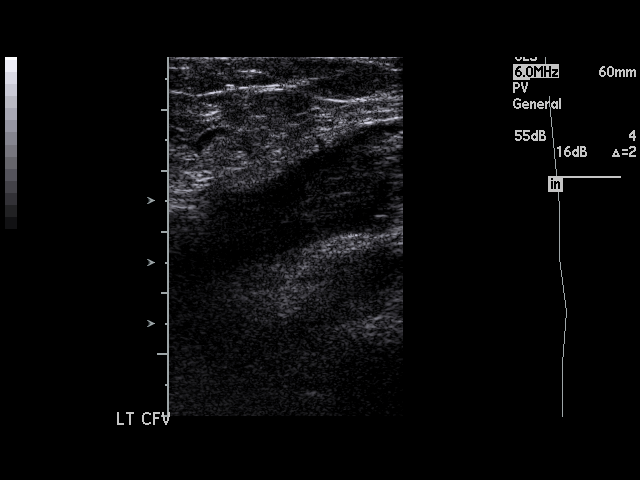
[im 15/35]
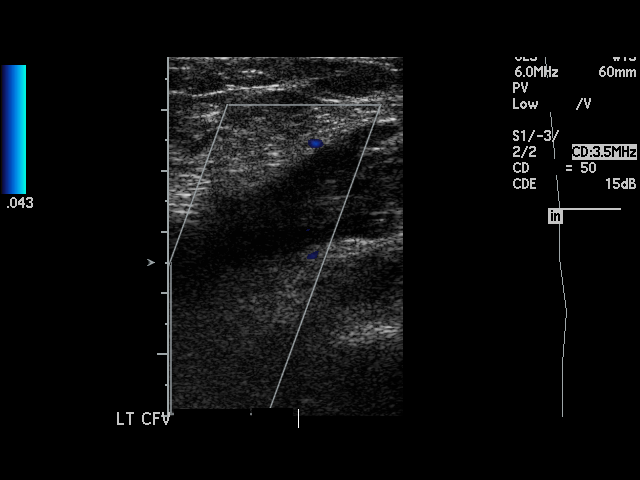
[im 18/35]
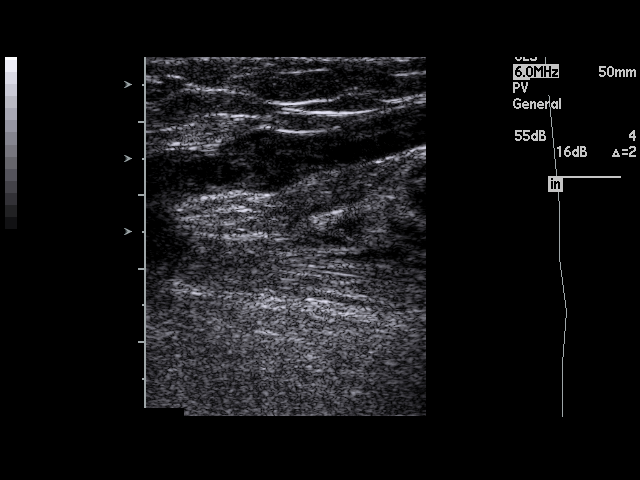
[im 20/35]
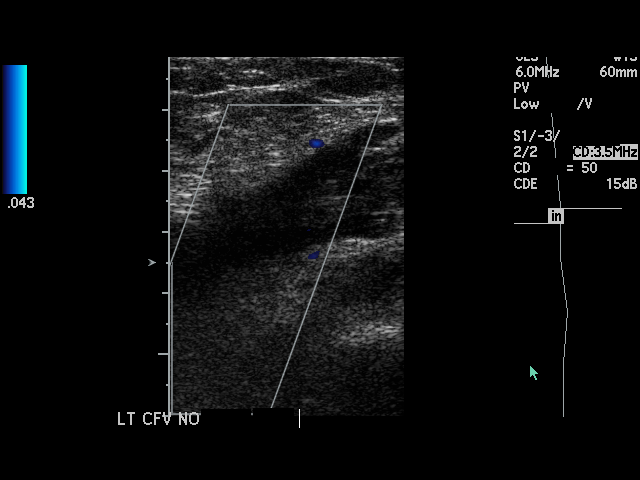
[im 21/35]
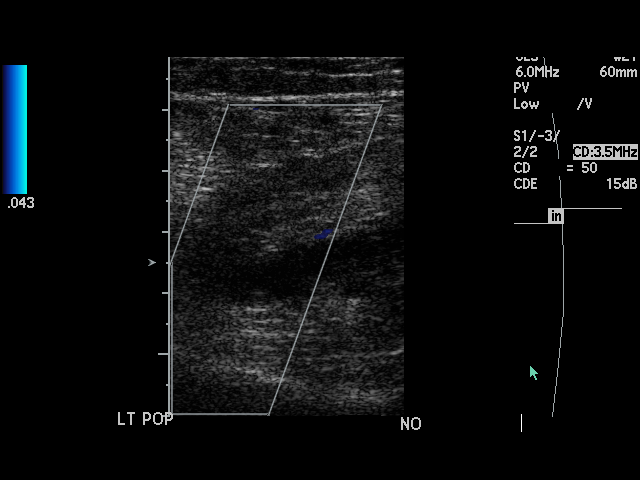
[im 24/35]
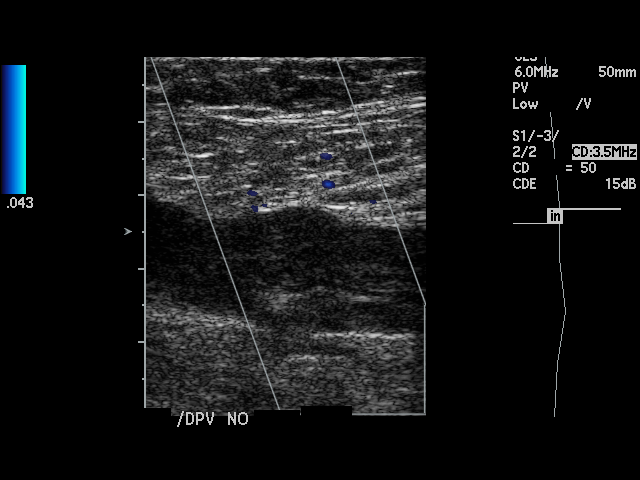
[im 26/35]
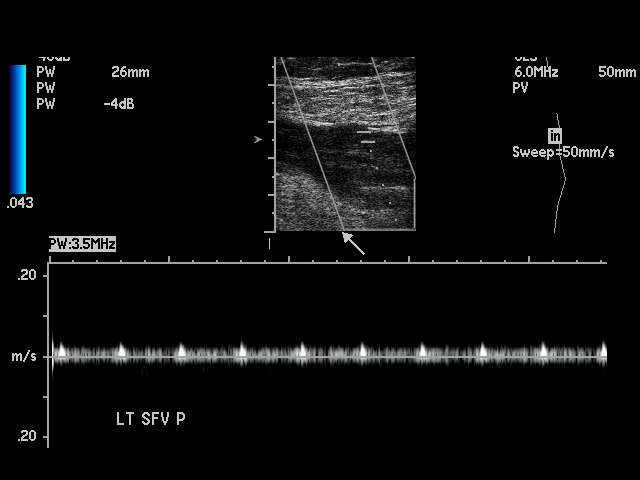
[im 29/35]
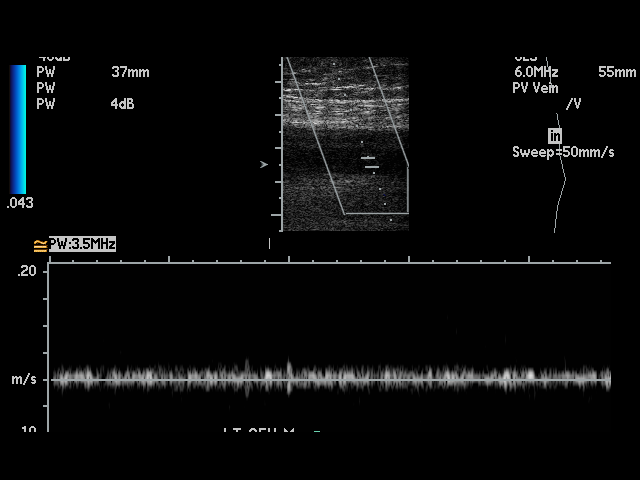
[im 30/35]
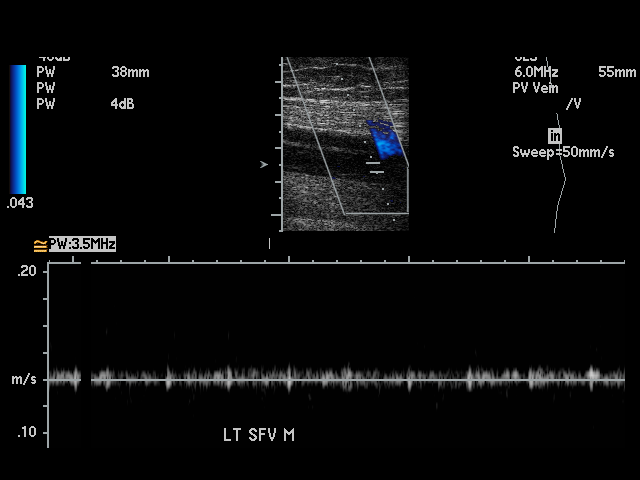
[im 32/35]
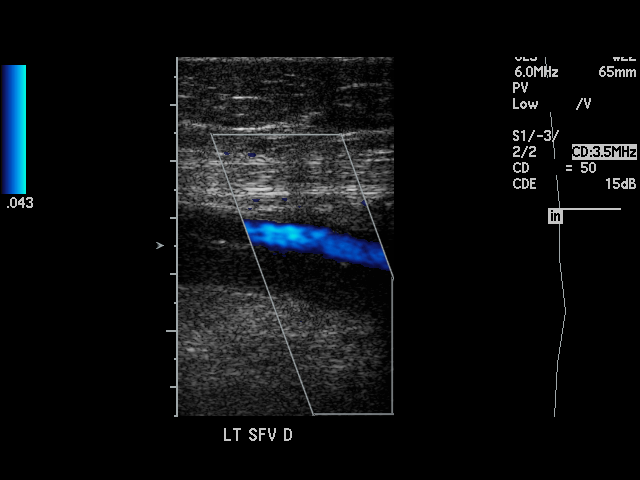
[im 35/35]
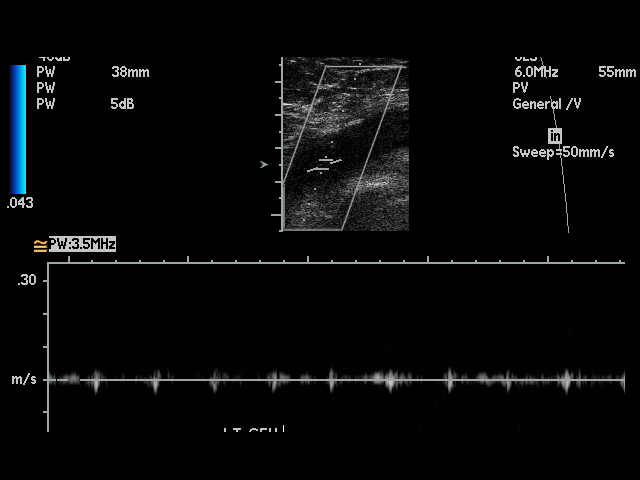

[17 of 24 positions shown; findings below may reference images not displayed]

PROCEDURE:     US  - US DOPPLER LOW EXTR LEFT  - [DATE] [DATE]

RESULT:          Duplex gray-scale, color-flow, and spectral waveform
imaging was performed of the deep venous structures of the LEFT lower
extremity.

Evaluation of the LEFT common femoral vein demonstrates extensive increased
echogenicity within the visualized portions of the vein, as well as evidence
of noncompressibility.  There does not appear to be significant color nor
gray-scale flow within the LEFT common femoral vein and no appreciable
waveforms.  There appears to be extension of the area of increased
echogenicity into the LEFT superficial femoral vein to the level of the
popliteal vein.  Minimal flow is demonstrated within the LEFT superficial
femoral vein, appreciated on the spectral waveform imaging.  There is
noncompressibility within the LEFT superficial femoral vein as well as the
LEFT popliteal vein.  The LEFT popliteal also demonstrates increased
echogenicity without evidence of compressibility.  There is absence of flow
within the LEFT popliteal vein.  The proximal portion of the LEFT greater
saphenous vein also demonstrates increased echogenicity as well as
noncompressibility.
IMPRESSION: 1.     Findings consistent with extensive occlusive thrombus within the LEFT
common femoral vein, extending to the LEFT superficial and LEFT popliteal
vein.  There is evidence of thrombus extending into the proximal portion of
the LEFT greater saphenous vein.
2.     Dr. HING CHUNG, of the emergency department, was informed of these
findings at the time of the initial interpretation.

## 2007-09-15 ENCOUNTER — Emergency Department: Payer: Self-pay | Admitting: Emergency Medicine

## 2010-07-12 ENCOUNTER — Emergency Department: Payer: Self-pay | Admitting: Emergency Medicine

## 2010-07-12 IMAGING — CR DG KNEE COMPLETE 4+V*L*
1 series · 5 of 5 positions shown · non-contrast
Comparison: none

REASON FOR EXAM: injury
COMMENTS:

PROCEDURE:     DXR - DXR KNEE LT COMP WITH OBLIQUES  - [DATE]  [DATE]
RESULT:     Comparison:  None

[Series 1: view not recorded · 0.17mm/px · 5 of 5 slices shown]
[im 1/5]
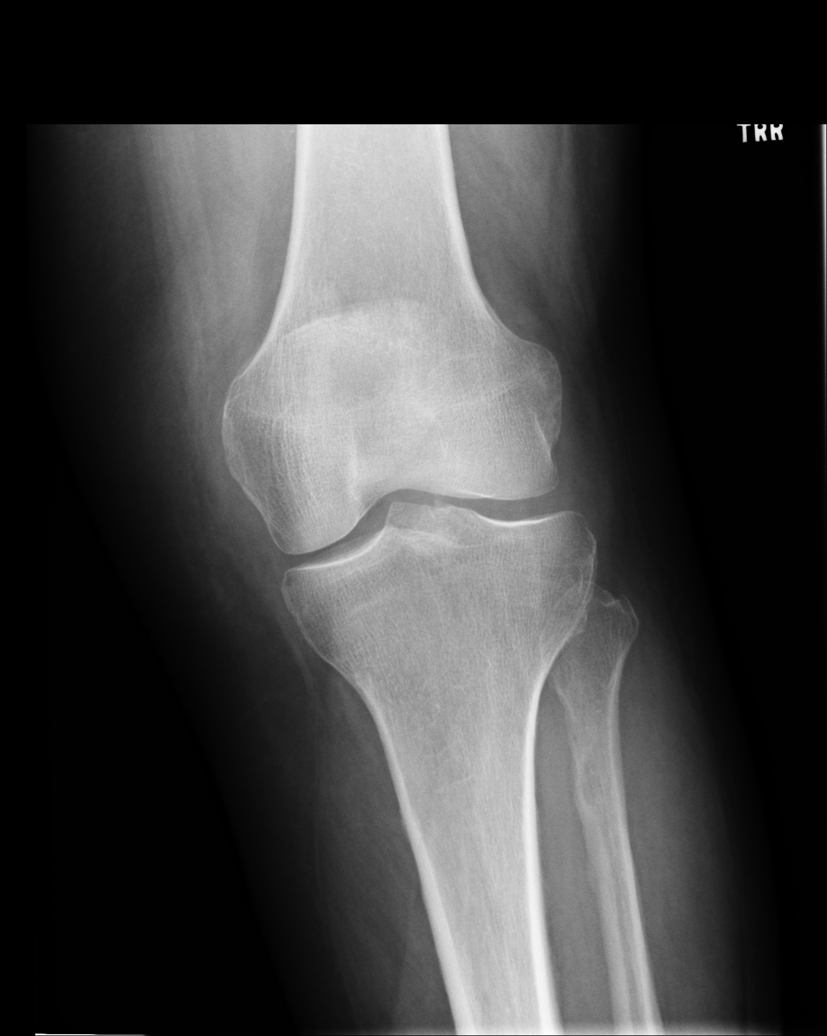
[im 2/5]
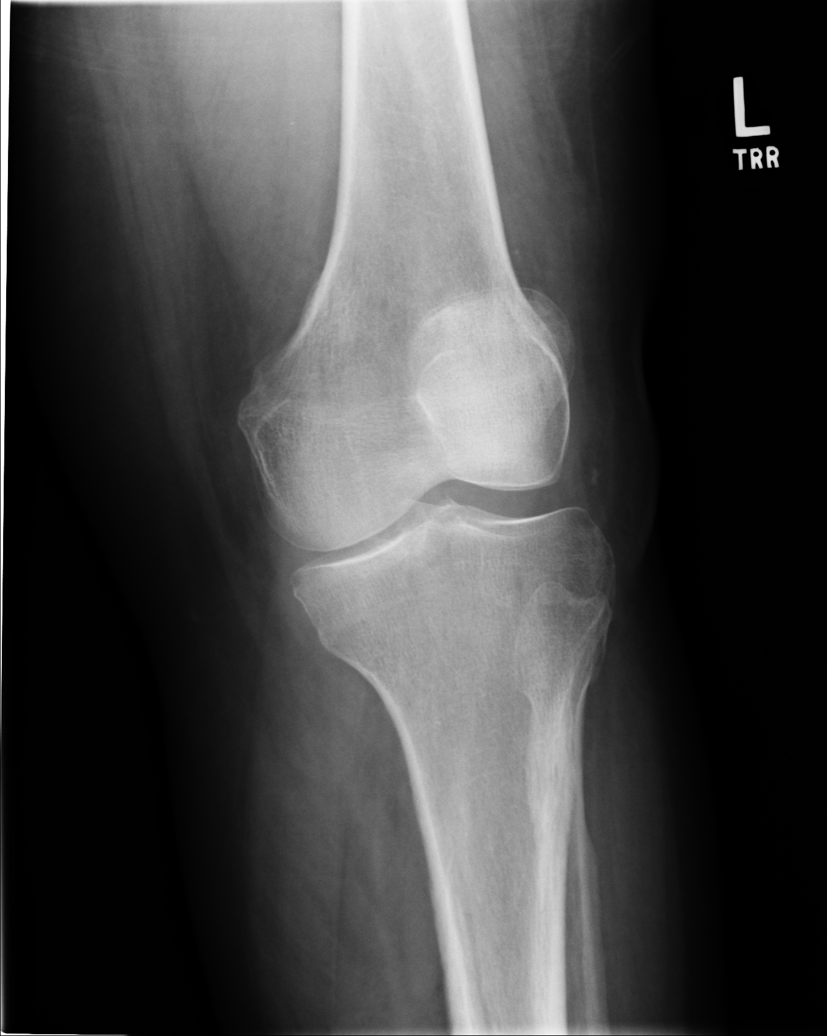
[im 3/5]
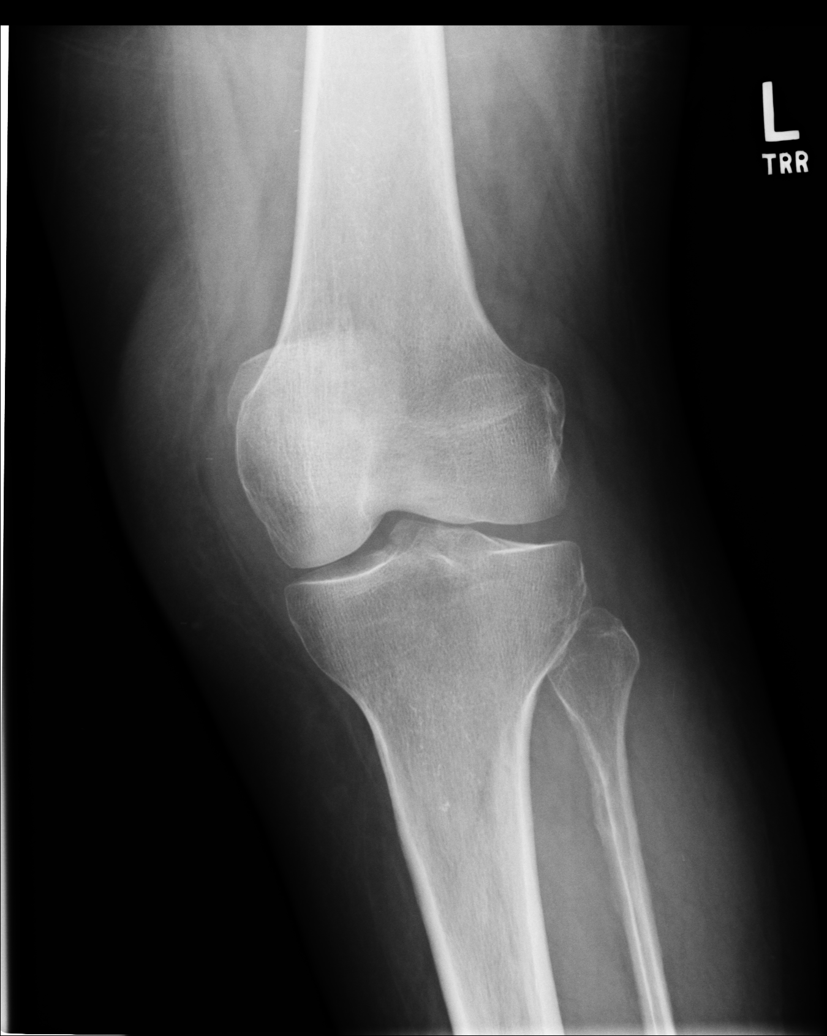
[im 4/5]
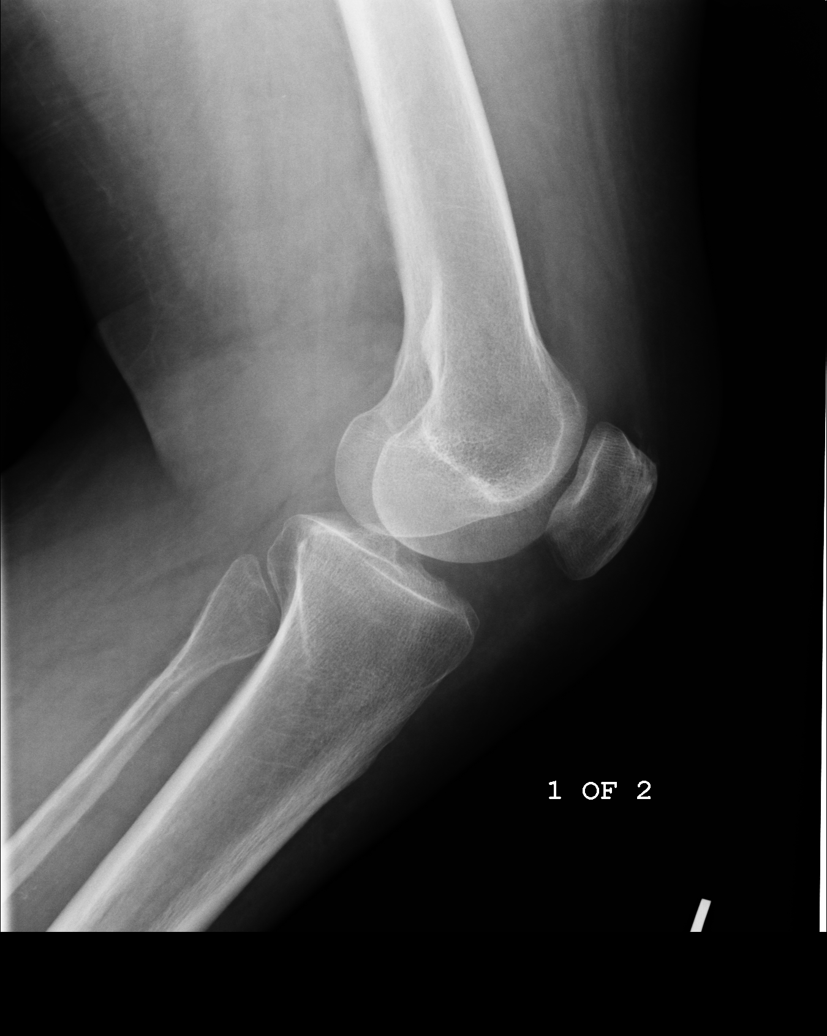
[im 5/5]
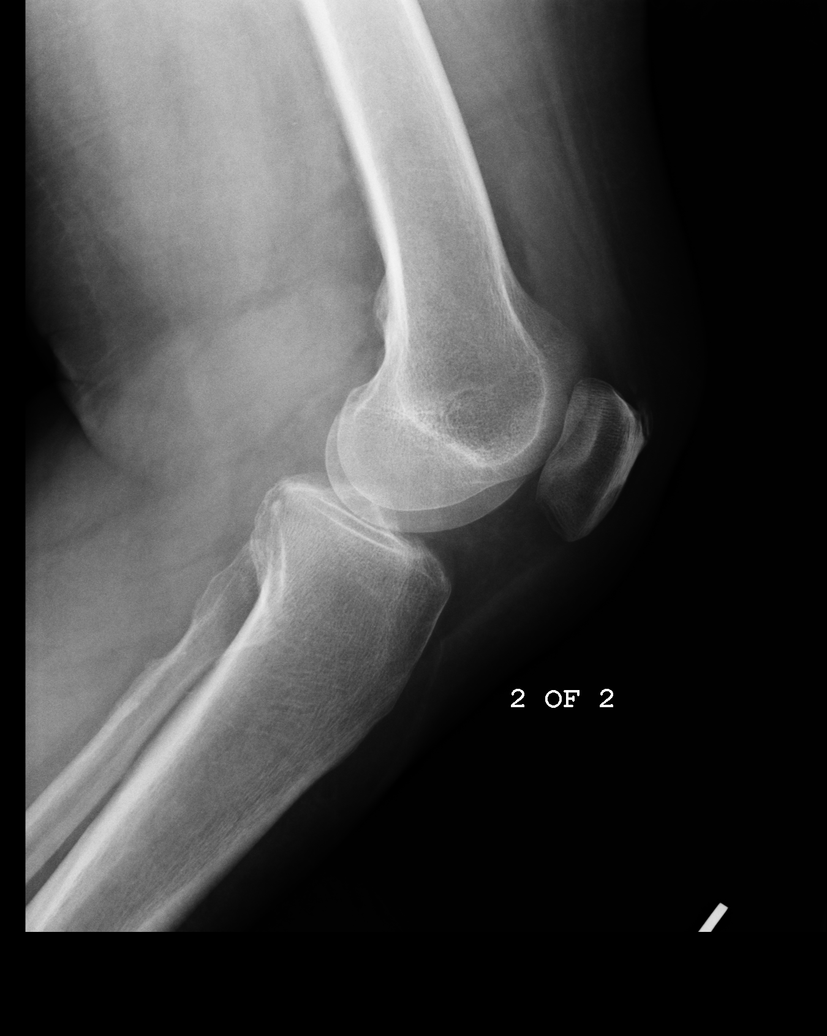

[5 of 5 positions shown; findings below may reference images not displayed]

FINDINGS: 5 views of the left knee demonstrates a nondisplaced fracture through the
tibial spine. There is a small joint effusion.
IMPRESSION: Please see above.

## 2010-08-06 ENCOUNTER — Ambulatory Visit: Payer: Self-pay | Admitting: Sports Medicine

## 2010-08-06 IMAGING — CR DG KNEE COMPLETE 4+V*L*
1 series · 5 of 5 positions shown · non-contrast
Comparison: none

REASON FOR EXAM: left knee pain 4V AP bil standing lat & obliques
COMMENTS:

[Series 1: view not recorded · 0.17mm/px · 5 of 5 slices shown]
[im 1/5]
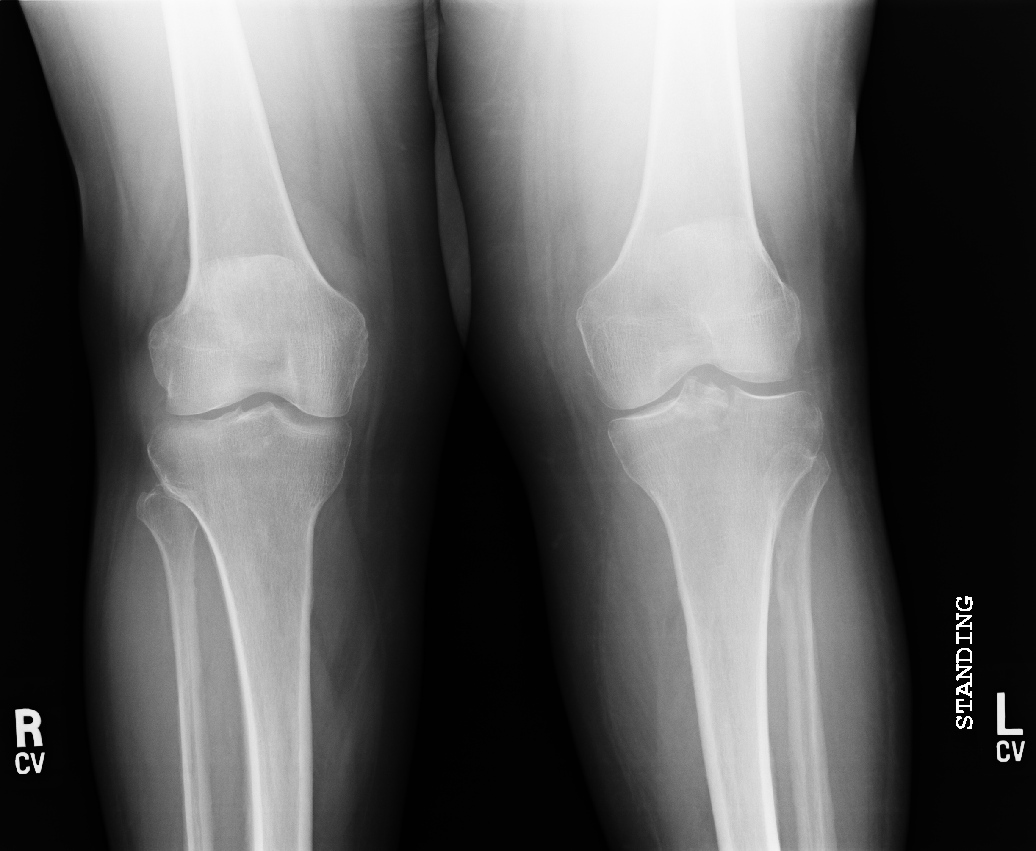
[im 2/5]
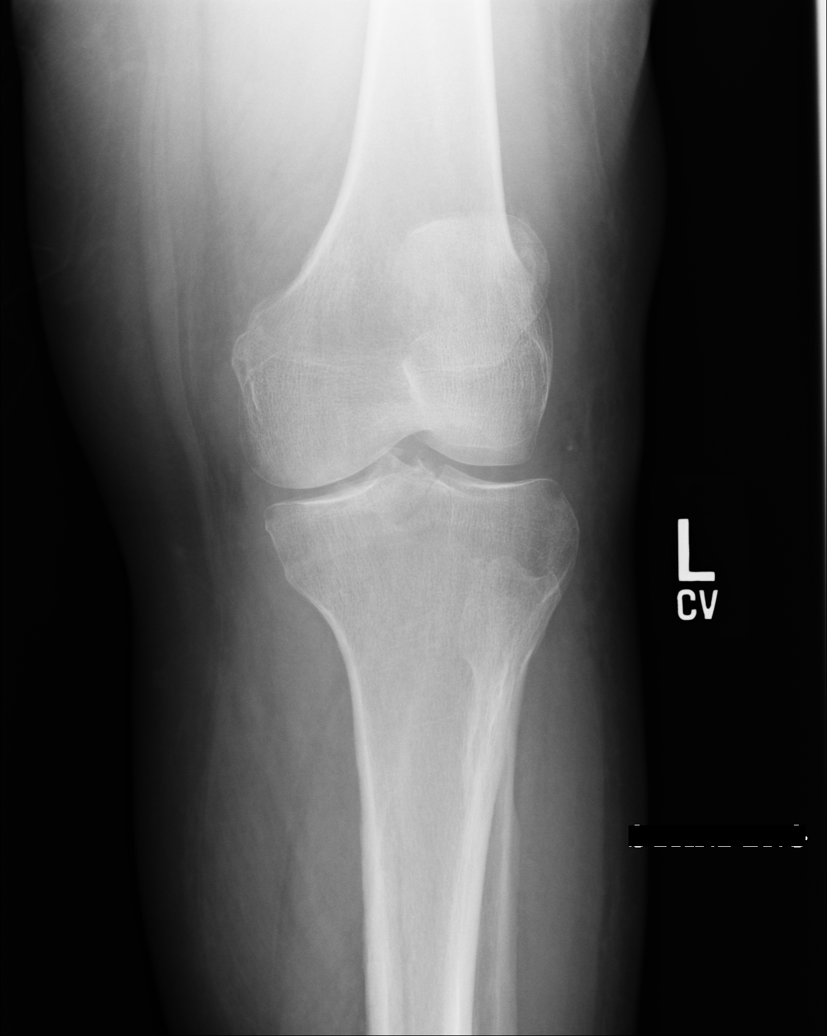
[im 3/5]
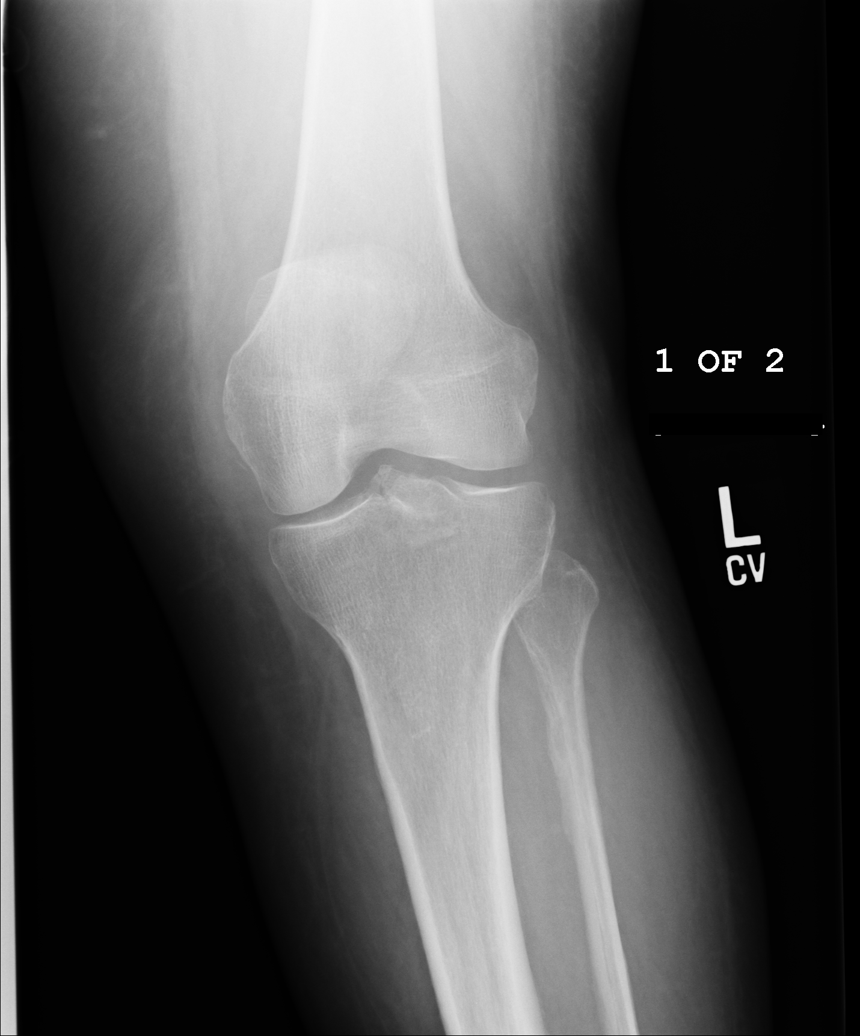
[im 4/5]
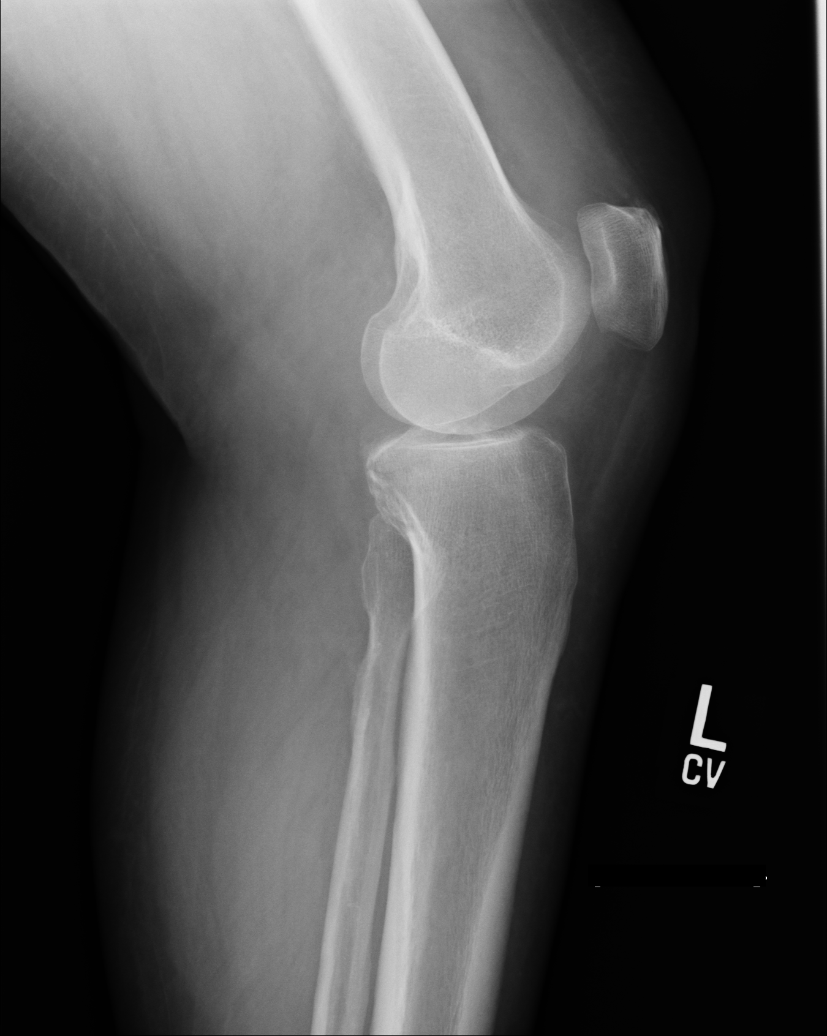
[im 5/5]
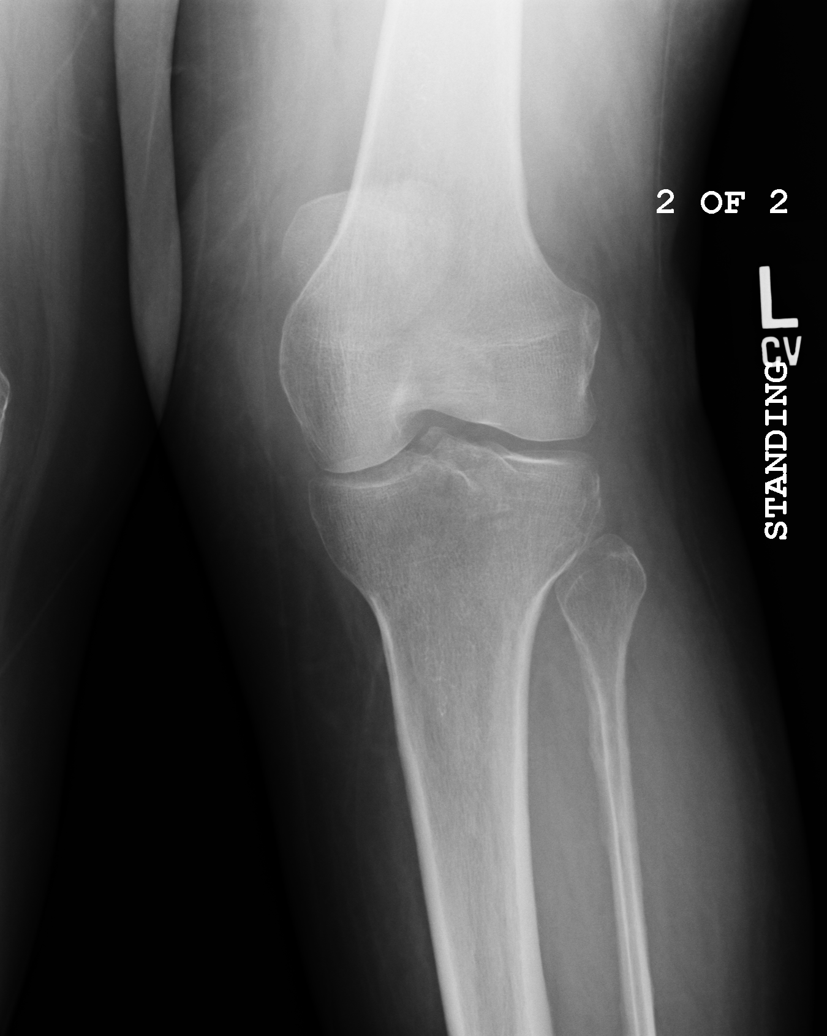

[5 of 5 positions shown; findings below may reference images not displayed]

PROCEDURE:     KDR - KDXR KNEE LT COMP WITH OBLIQUES  - [DATE]  [DATE]

RESULT:     Standing AP view of both knees was obtained. On the left there
is noted a fracture laterally at the end PORCH condylar area. The
intracondylar tubercles may be slightly depressed. This could be further
evaluated by CT. No fracture of the left femur is seen. The patella is
intact.
IMPRESSION: 1. Fracture of the proximal left tibia involving the intracondylar area.
Minimal displacement is noted by routine radiography but additional
evaluation by CT would likely be helpful.

## 2010-08-21 ENCOUNTER — Ambulatory Visit: Payer: Self-pay | Admitting: Sports Medicine

## 2010-08-21 IMAGING — CR DG KNEE COMPLETE 4+V*L*
1 series · 5 of 5 positions shown · non-contrast
Comparison: none

REASON FOR EXAM: knee pain, standing ap and lat, standing obliques
COMMENTS:

[Series 1: view not recorded · 0.17mm/px · 5 of 5 slices shown]
[im 1/5]
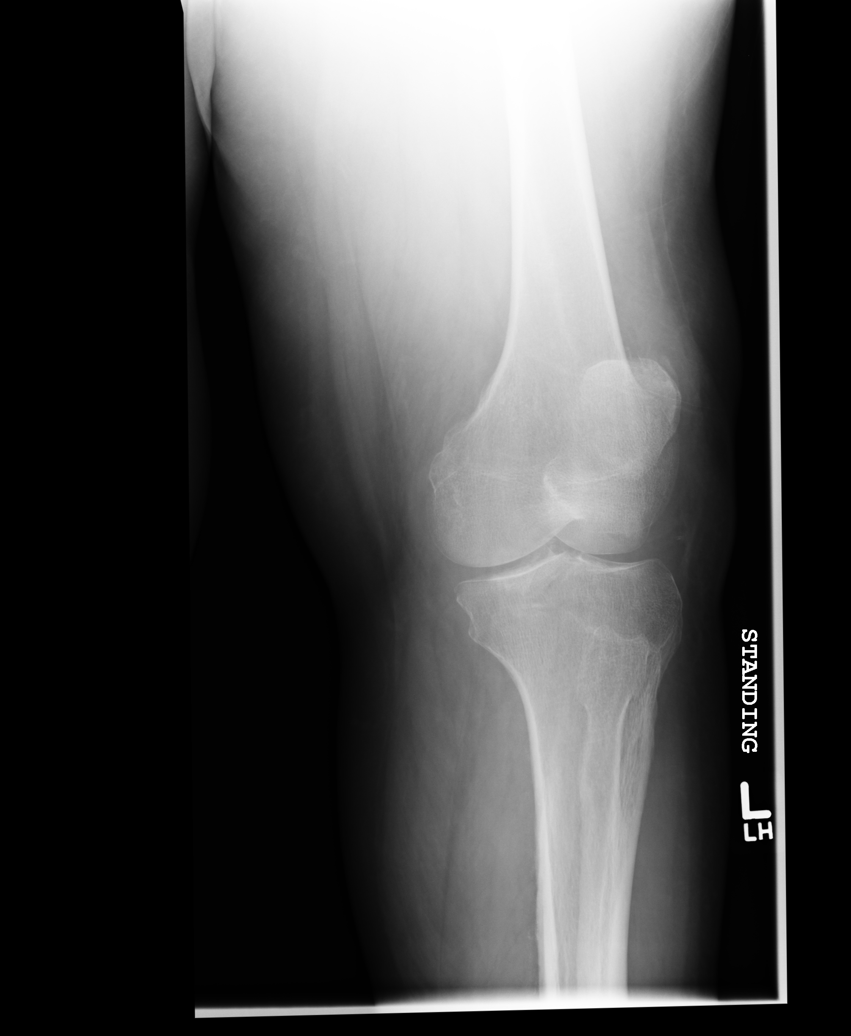
[im 2/5]
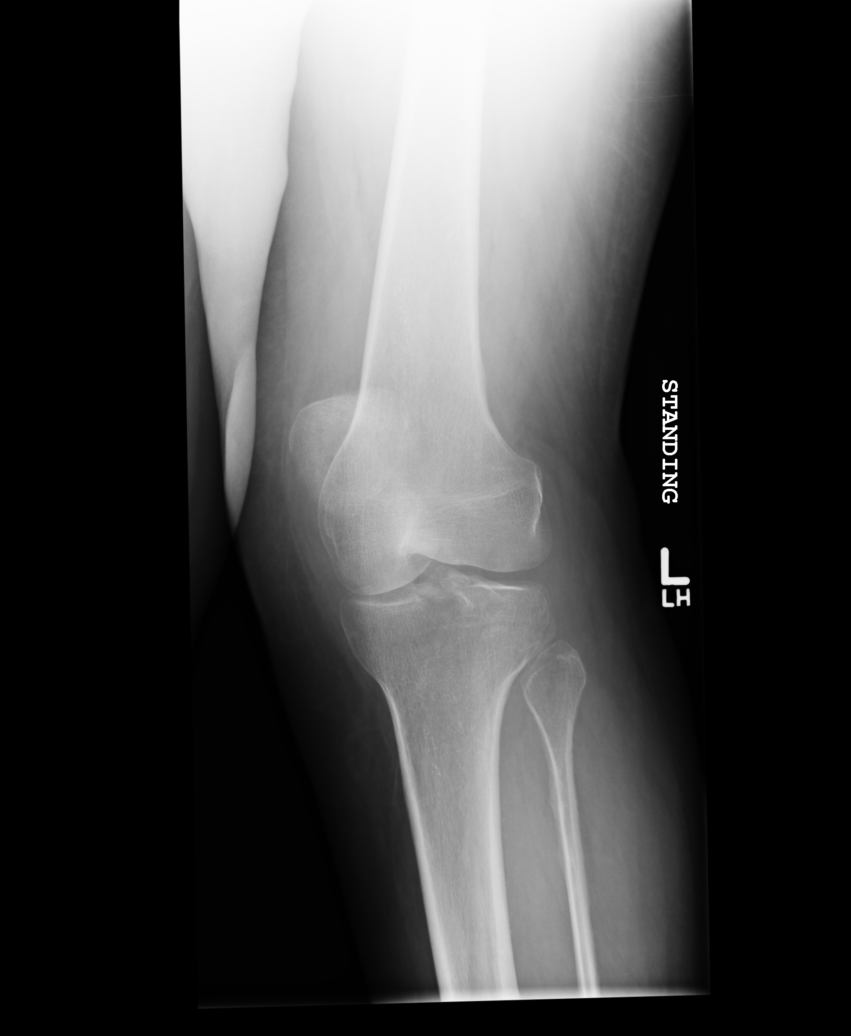
[im 3/5]
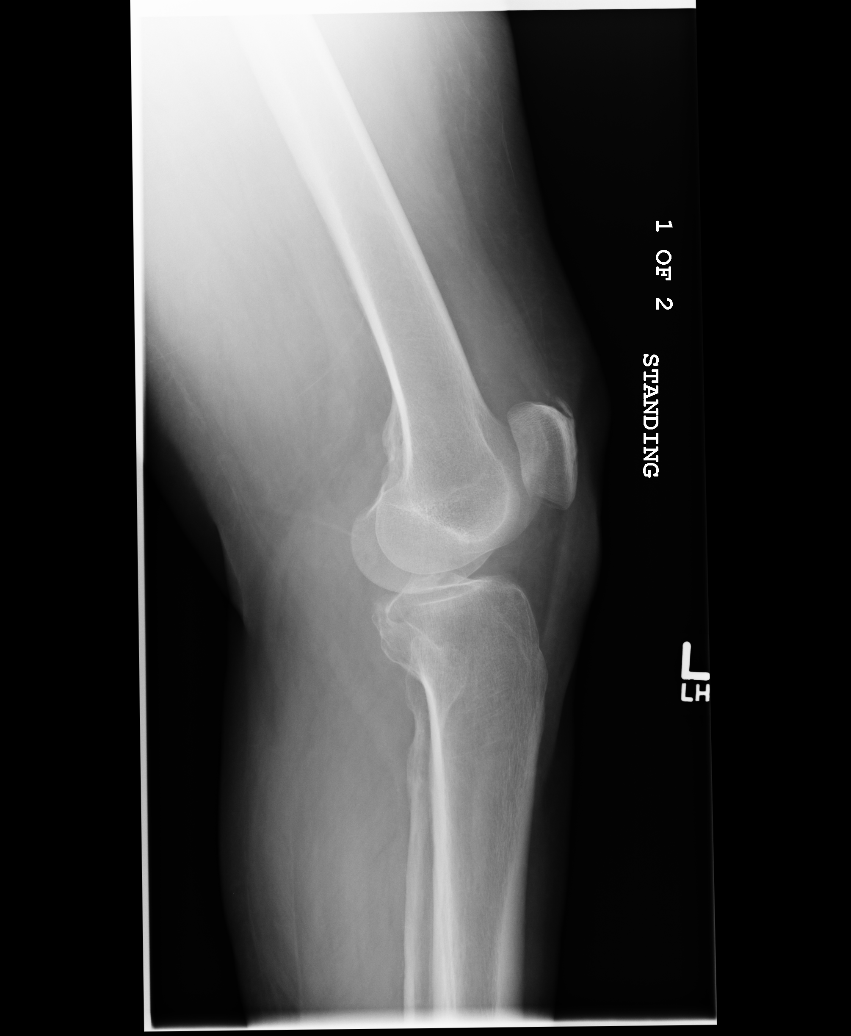
[im 4/5]
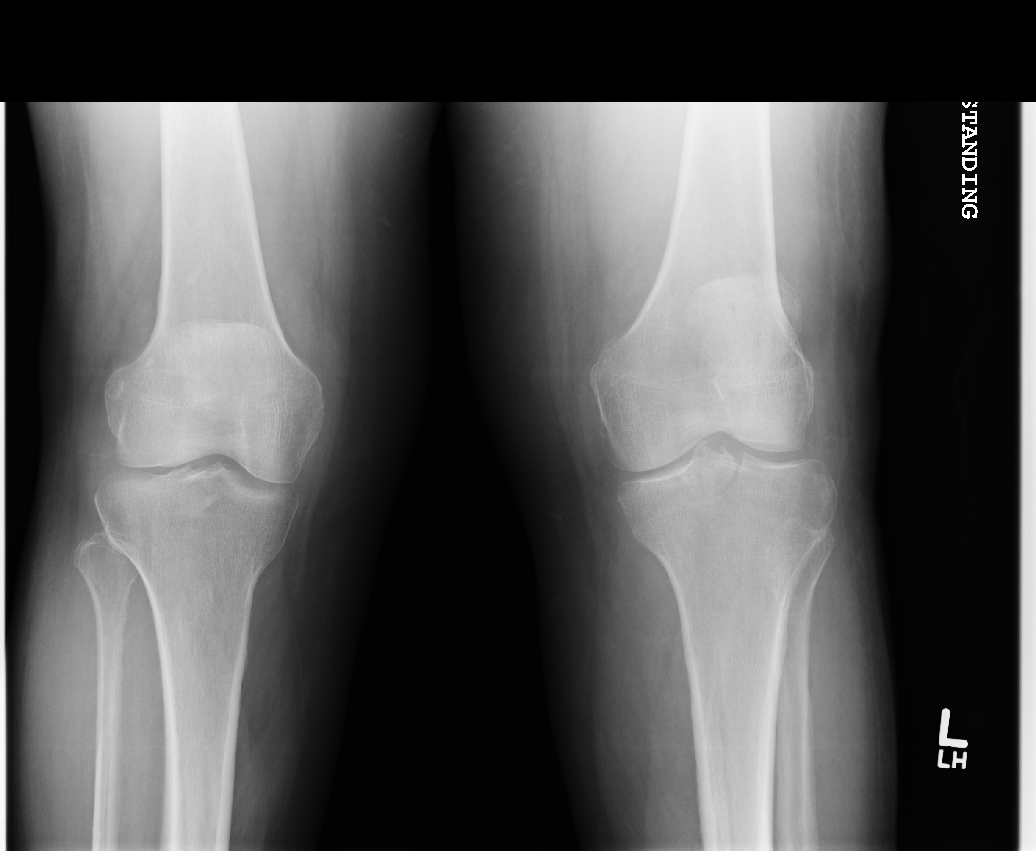
[im 5/5]
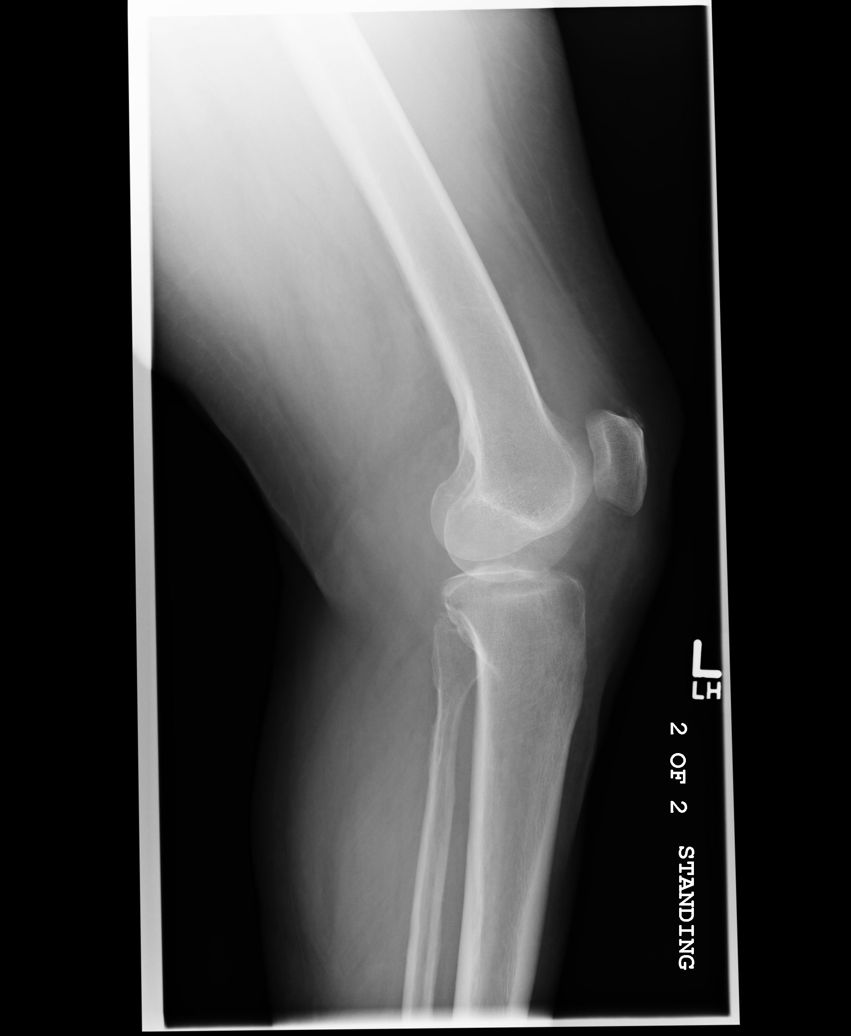

[5 of 5 positions shown; findings below may reference images not displayed]

PROCEDURE:     KDR - KDXR KNEE LT COMP WITH OBLIQUES  - [DATE]  [DATE]

RESULT:     AP, lateral and oblique views of the left knee were obtained
with the patient standing. In addition an AP view of the right knee was
performed. No significant interval changes are seen as compared to the prior
exam of [DATE]. There is again noted an intracondylar fracture of the
proximal left tibia. The fracture fragments remain minimally displaced. No
new fracture about the left knee is identified.
IMPRESSION: 1.     Fracture of the proximal left tibia with bony fracture components
appearing unchanged in position as compared to the prior exam of [DATE].
The fracture could be further evaluated by CT or, if internal derangement is
suspected, then MR would be recommended.

## 2013-09-22 ENCOUNTER — Ambulatory Visit: Payer: Self-pay | Admitting: General Surgery

## 2013-10-06 ENCOUNTER — Ambulatory Visit: Payer: Commercial Managed Care - HMO | Admitting: General Surgery

## 2013-10-26 ENCOUNTER — Ambulatory Visit: Payer: Commercial Managed Care - HMO | Admitting: General Surgery

## 2013-11-03 ENCOUNTER — Encounter: Payer: Self-pay | Admitting: *Deleted

## 2014-01-27 ENCOUNTER — Emergency Department: Payer: Self-pay | Admitting: Emergency Medicine

## 2014-01-27 LAB — CBC WITH DIFFERENTIAL/PLATELET
BASOS ABS: 0 10*3/uL (ref 0.0–0.1)
Basophil %: 0.2 %
EOS ABS: 0 10*3/uL (ref 0.0–0.7)
Eosinophil %: 0.2 %
HCT: 43.7 % (ref 35.0–47.0)
HGB: 14.4 g/dL (ref 12.0–16.0)
LYMPHS ABS: 1 10*3/uL (ref 1.0–3.6)
LYMPHS PCT: 7.1 %
MCH: 28.4 pg (ref 26.0–34.0)
MCHC: 32.9 g/dL (ref 32.0–36.0)
MCV: 86 fL (ref 80–100)
Monocyte #: 0.7 x10 3/mm (ref 0.2–0.9)
Monocyte %: 5 %
NEUTROS ABS: 12 10*3/uL — AB (ref 1.4–6.5)
Neutrophil %: 87.5 %
PLATELETS: 245 10*3/uL (ref 150–440)
RBC: 5.07 10*6/uL (ref 3.80–5.20)
RDW: 14.4 % (ref 11.5–14.5)
WBC: 13.7 10*3/uL — ABNORMAL HIGH (ref 3.6–11.0)

## 2014-01-27 LAB — URINALYSIS, COMPLETE
BILIRUBIN, UR: NEGATIVE
Blood: NEGATIVE
Glucose,UR: >=500 mg/dL (ref 0–75)
Leukocyte Esterase: NEGATIVE
Nitrite: NEGATIVE
Ph: 5 (ref 4.5–8.0)
Protein: 100
Specific Gravity: 1.024 (ref 1.003–1.030)
Squamous Epithelial: 2
WBC UR: 23 /HPF (ref 0–5)

## 2014-01-27 LAB — COMPREHENSIVE METABOLIC PANEL
ANION GAP: 13 (ref 7–16)
Albumin: 3.8 g/dL (ref 3.4–5.0)
Alkaline Phosphatase: 125 U/L — ABNORMAL HIGH
BUN: 10 mg/dL (ref 7–18)
Bilirubin,Total: 1.4 mg/dL — ABNORMAL HIGH (ref 0.2–1.0)
CHLORIDE: 97 mmol/L — AB (ref 98–107)
Calcium, Total: 9.2 mg/dL (ref 8.5–10.1)
Co2: 23 mmol/L (ref 21–32)
Creatinine: 0.66 mg/dL (ref 0.60–1.30)
EGFR (African American): >60
EGFR (Non-African Amer.): >60
Glucose: 241 mg/dL — ABNORMAL HIGH (ref 65–99)
Potassium: 4.1 mmol/L (ref 3.5–5.1)
SGOT(AST): 23 U/L (ref 15–37)
SGPT (ALT): 30 U/L
Sodium: 133 mmol/L — ABNORMAL LOW (ref 136–145)
TOTAL PROTEIN: 7.9 g/dL (ref 6.4–8.2)

## 2014-01-27 LAB — PROTIME-INR
INR: 1.4
PROTHROMBIN TIME: 16.5 s — AB (ref 11.5–14.7)

## 2014-01-27 LAB — TROPONIN I

## 2014-01-27 LAB — LIPASE, BLOOD: Lipase: 42 U/L — ABNORMAL LOW (ref 73–393)

## 2014-01-27 IMAGING — US ABDOMEN ULTRASOUND LIMITED
1 series · 14 of 25 positions shown · non-contrast
Comparison: CT [DATE].

CLINICAL DATA: Right upper quadrant pain.

EXAM:
US ABDOMEN LIMITED - RIGHT UPPER QUADRANT

[Series 1: abdomen ultrasound limited · 0.26mm/px · 14 of 36 slices shown]
[im 1/36]
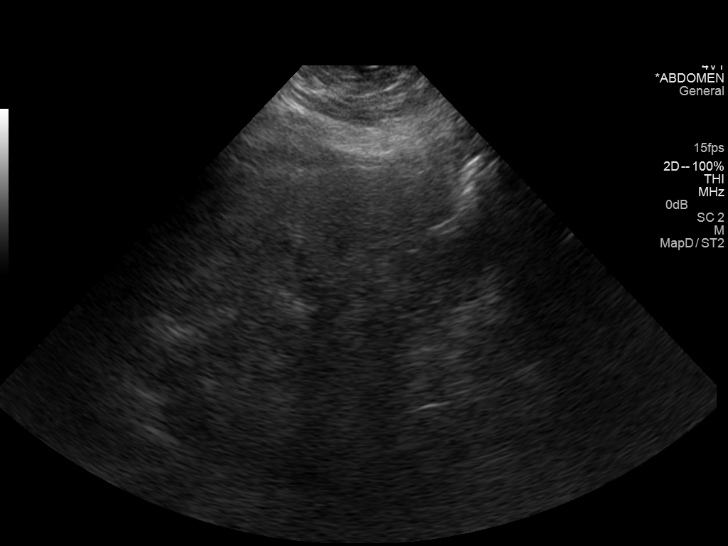
[im 3/36]
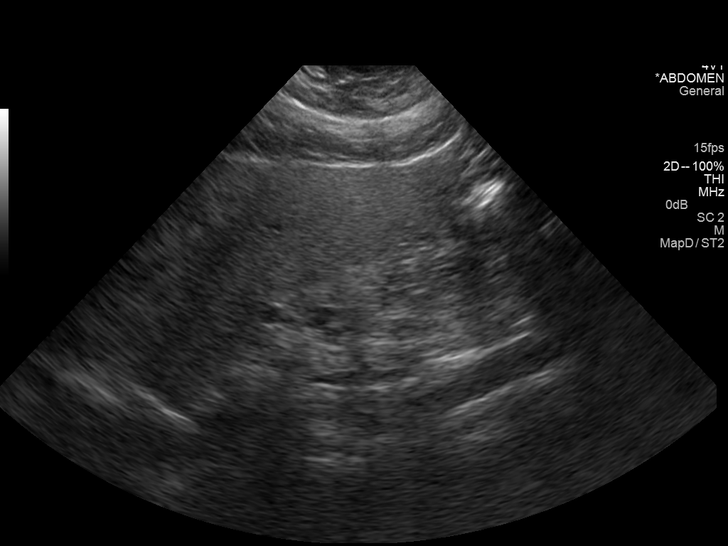
[im 6/36]
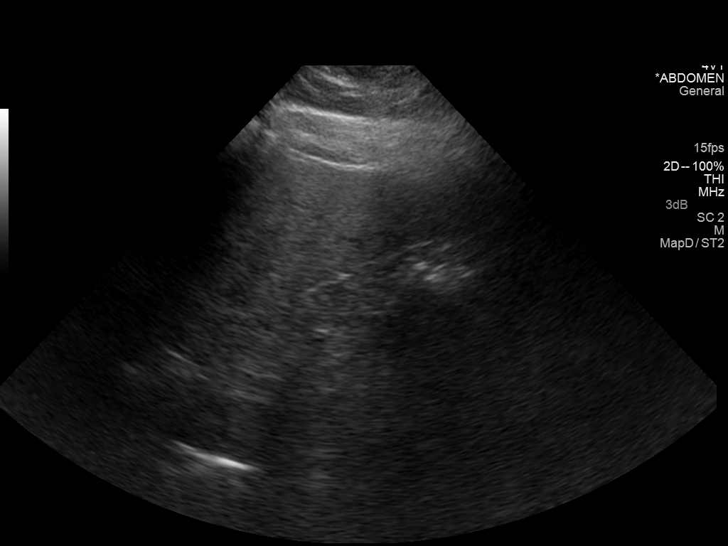
[im 9/36]
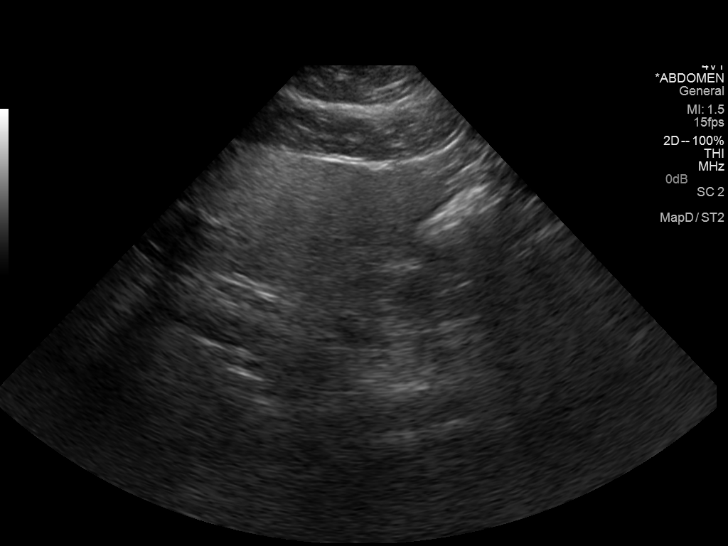
[im 12/36]
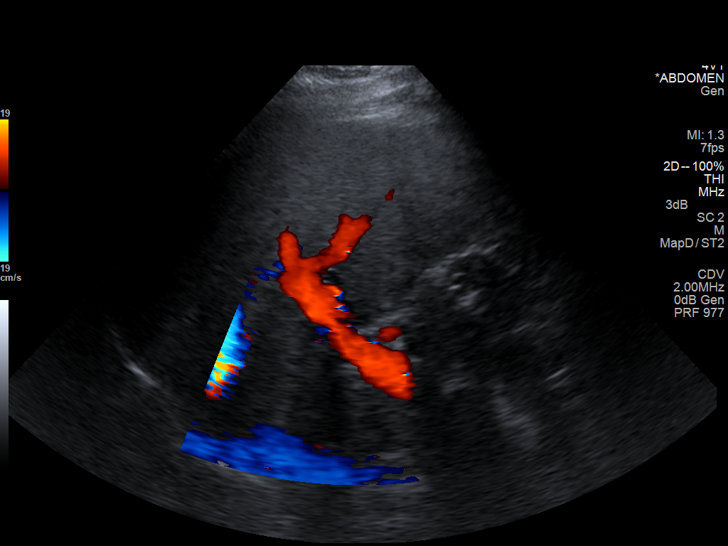
[im 14/36]
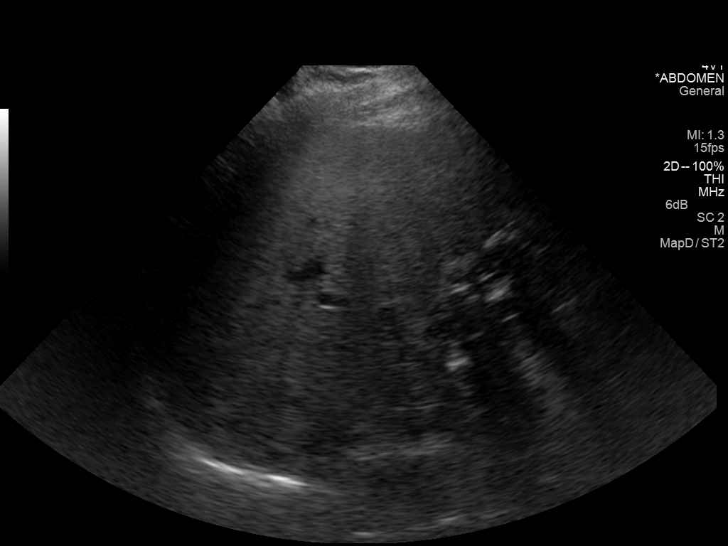
[im 17/36]
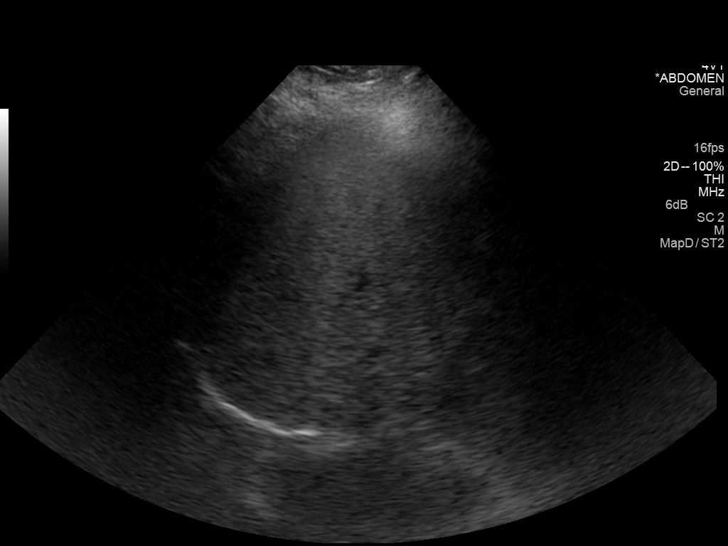
[im 19/36]
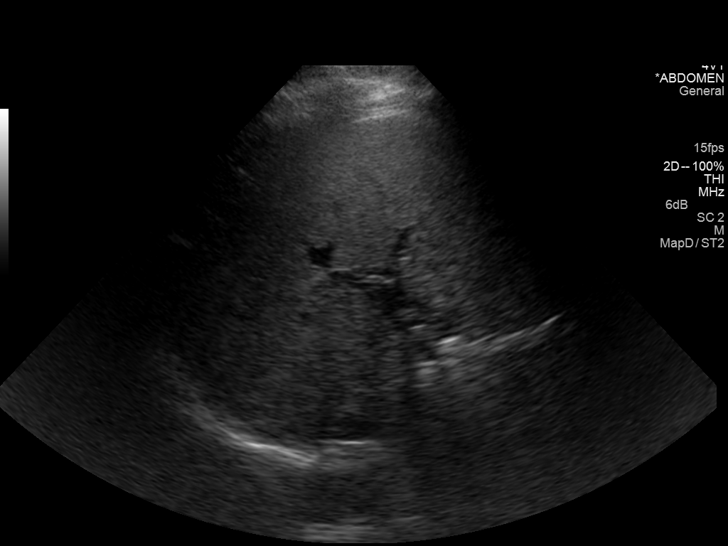
[im 22/36]
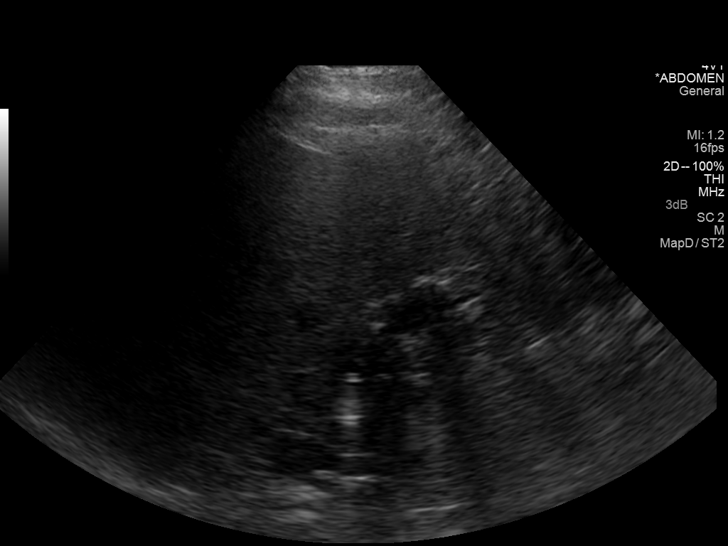
[im 24/36]
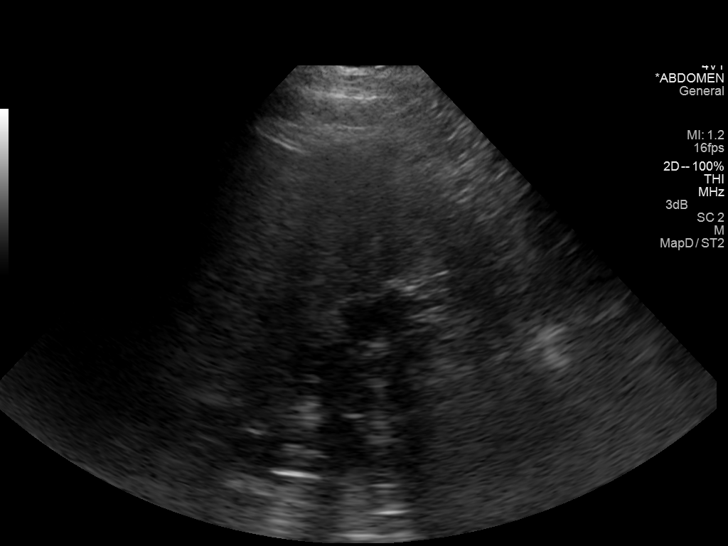
[im 27/36]
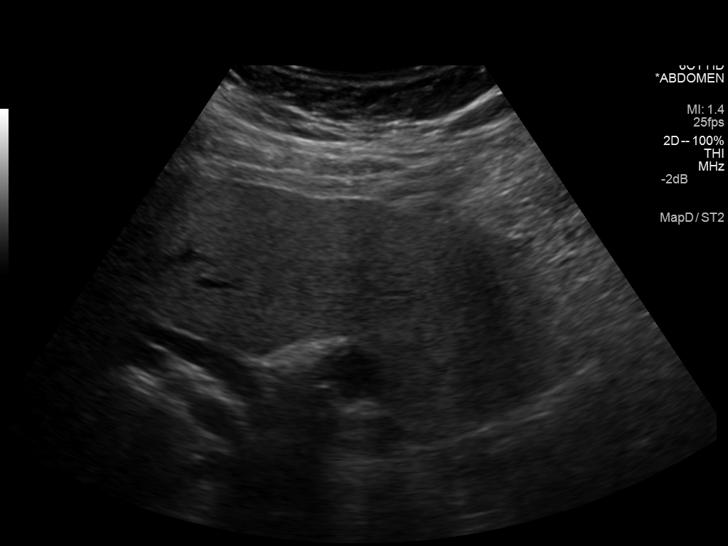
[im 30/36]
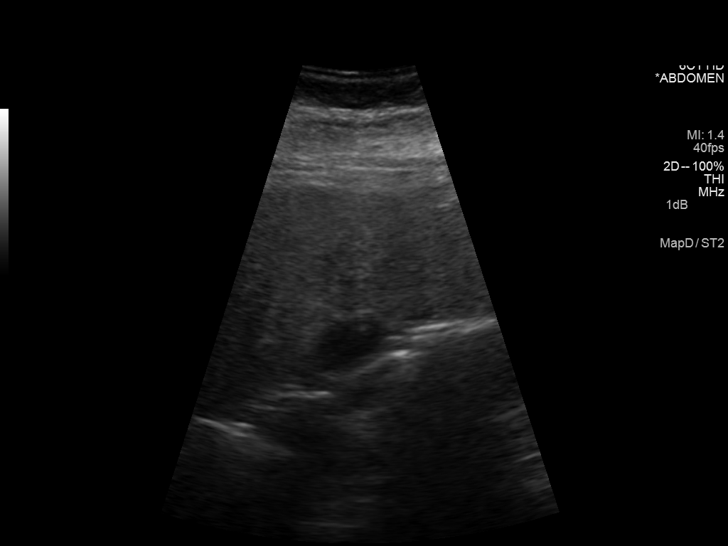
[im 33/36]
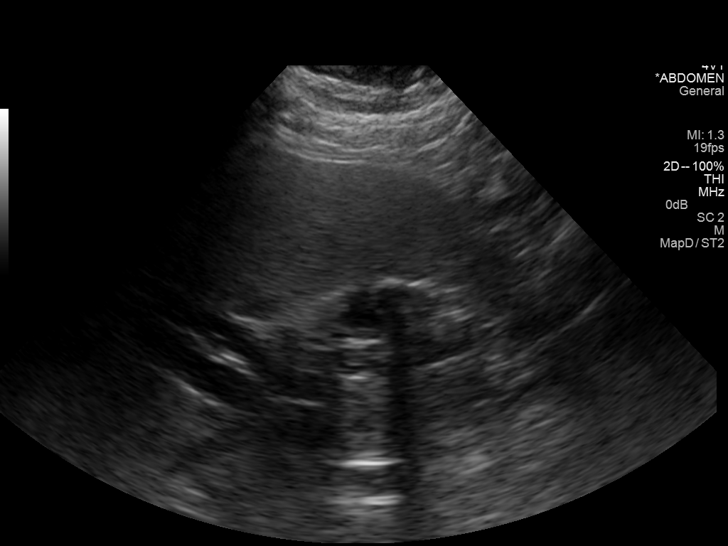
[im 36/36]
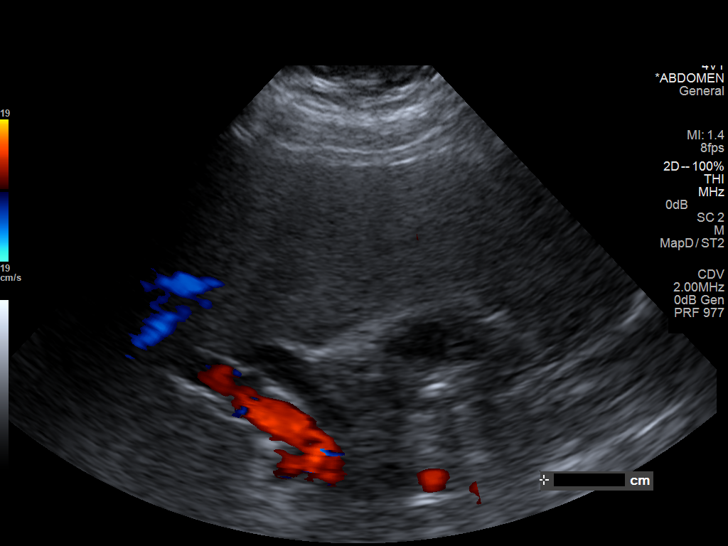

[14 of 25 positions shown; findings below may reference images not displayed]

FINDINGS: Gallbladder:

Gallbladder is contracted and difficult to evaluate. No
pericholecystic fluid collection noted. Negative Murphy sign.

Common bile duct:

Diameter: 7.9 mm

Liver:

Liver is very echogenic suggesting diffuse fatty infiltration.
IMPRESSION: 1. Gallbladder is contracted and difficult to evaluate. Mild
prominence of the common bile duct at 7.9 mm.

2.  Fatty infiltration of the liver.

## 2014-05-04 DIAGNOSIS — R748 Abnormal levels of other serum enzymes: Secondary | ICD-10-CM | POA: Diagnosis not present

## 2014-05-04 DIAGNOSIS — Z7901 Long term (current) use of anticoagulants: Secondary | ICD-10-CM | POA: Diagnosis not present

## 2014-05-04 DIAGNOSIS — E559 Vitamin D deficiency, unspecified: Secondary | ICD-10-CM | POA: Diagnosis not present

## 2014-05-04 DIAGNOSIS — I10 Essential (primary) hypertension: Secondary | ICD-10-CM | POA: Diagnosis not present

## 2014-05-04 DIAGNOSIS — E1165 Type 2 diabetes mellitus with hyperglycemia: Secondary | ICD-10-CM | POA: Diagnosis not present

## 2014-05-04 DIAGNOSIS — E538 Deficiency of other specified B group vitamins: Secondary | ICD-10-CM | POA: Diagnosis not present

## 2014-05-10 DIAGNOSIS — K651 Peritoneal abscess: Secondary | ICD-10-CM | POA: Diagnosis not present

## 2014-06-02 DIAGNOSIS — E1165 Type 2 diabetes mellitus with hyperglycemia: Secondary | ICD-10-CM | POA: Diagnosis not present

## 2014-06-02 DIAGNOSIS — I82409 Acute embolism and thrombosis of unspecified deep veins of unspecified lower extremity: Secondary | ICD-10-CM | POA: Diagnosis not present

## 2014-06-02 DIAGNOSIS — Z7901 Long term (current) use of anticoagulants: Secondary | ICD-10-CM | POA: Diagnosis not present

## 2014-06-21 DIAGNOSIS — I82409 Acute embolism and thrombosis of unspecified deep veins of unspecified lower extremity: Secondary | ICD-10-CM | POA: Diagnosis not present

## 2014-06-21 DIAGNOSIS — I10 Essential (primary) hypertension: Secondary | ICD-10-CM | POA: Diagnosis not present

## 2014-06-21 DIAGNOSIS — E1165 Type 2 diabetes mellitus with hyperglycemia: Secondary | ICD-10-CM | POA: Diagnosis not present

## 2014-06-21 DIAGNOSIS — Z7901 Long term (current) use of anticoagulants: Secondary | ICD-10-CM | POA: Diagnosis not present

## 2014-06-26 DIAGNOSIS — K651 Peritoneal abscess: Secondary | ICD-10-CM | POA: Diagnosis not present

## 2014-06-26 DIAGNOSIS — K5900 Constipation, unspecified: Secondary | ICD-10-CM | POA: Diagnosis not present

## 2014-07-21 DIAGNOSIS — I82409 Acute embolism and thrombosis of unspecified deep veins of unspecified lower extremity: Secondary | ICD-10-CM | POA: Diagnosis not present

## 2014-07-21 DIAGNOSIS — Z7901 Long term (current) use of anticoagulants: Secondary | ICD-10-CM | POA: Diagnosis not present

## 2014-07-21 DIAGNOSIS — K5900 Constipation, unspecified: Secondary | ICD-10-CM | POA: Diagnosis not present

## 2014-07-31 DIAGNOSIS — I82409 Acute embolism and thrombosis of unspecified deep veins of unspecified lower extremity: Secondary | ICD-10-CM | POA: Diagnosis not present

## 2014-07-31 DIAGNOSIS — Z7901 Long term (current) use of anticoagulants: Secondary | ICD-10-CM | POA: Diagnosis not present

## 2014-08-09 DIAGNOSIS — B029 Zoster without complications: Secondary | ICD-10-CM | POA: Diagnosis not present

## 2014-08-14 DIAGNOSIS — B029 Zoster without complications: Secondary | ICD-10-CM | POA: Diagnosis not present

## 2014-08-23 DIAGNOSIS — Z7901 Long term (current) use of anticoagulants: Secondary | ICD-10-CM | POA: Diagnosis not present

## 2014-08-23 DIAGNOSIS — B029 Zoster without complications: Secondary | ICD-10-CM | POA: Diagnosis not present

## 2014-09-05 ENCOUNTER — Telehealth: Payer: Self-pay | Admitting: Family Medicine

## 2014-09-05 NOTE — Telephone Encounter (Signed)
I attempted to contact pt to reschedule appt for 09/06/14. There was no answer. I left a voicemail message. We also needs to get new contact numbers for this pt as only one of the numbers on file is a working number. Thanks.

## 2014-09-06 ENCOUNTER — Ambulatory Visit: Payer: Self-pay | Admitting: Family Medicine

## 2014-09-06 ENCOUNTER — Encounter: Payer: Self-pay | Admitting: Unknown Physician Specialty

## 2014-09-06 ENCOUNTER — Telehealth: Payer: Self-pay | Admitting: Family Medicine

## 2014-09-06 ENCOUNTER — Ambulatory Visit (INDEPENDENT_AMBULATORY_CARE_PROVIDER_SITE_OTHER): Payer: Commercial Managed Care - HMO | Admitting: Unknown Physician Specialty

## 2014-09-06 VITALS — BP 126/76 | HR 118 | Temp 98.3°F | Ht 63.0 in | Wt 204.0 lb

## 2014-09-06 DIAGNOSIS — B029 Zoster without complications: Secondary | ICD-10-CM

## 2014-09-06 DIAGNOSIS — B0229 Other postherpetic nervous system involvement: Secondary | ICD-10-CM | POA: Diagnosis not present

## 2014-09-06 MED ORDER — LIDOCAINE HCL 4 % EX SOLN: CUTANEOUS | Status: DC | PRN

## 2014-09-06 NOTE — Progress Notes (Signed)
   BP 126/76 mmHg  Pulse 118  Temp(Src) 98.3 F (36.8 C) (Oral)  Ht 5\' 3"  (1.6 m)  Wt 204 lb (92.534 kg)  BMI 36.15 kg/m2  SpO2 99%   Subjective:    Patient ID: Daisy Watkins, female    DOB: February 19, 1933, 81 y.o.   MRN: [id number]  HPI: Daisy Watkins is a 81 y.o. female presenting on 09/06/2014 for Herpes Zoster   Diagnosed with shingles on 5/15 upper thorax and down right arm.  Still taking Gabapenting 300 mgs 2-3 times/day.  States it still hurts and "it don't help."  She also didn't take the "pain pill" as that did not help.  She is wondering if there is a topical treatment for it.   States Tylenol helps the best.    Relevant past medical, surgical, family and social history reviewed and updated as indicated. Interim medical history since our last visit reviewed. Allergies and medications reviewed and updated.  Review of Systems  Per HPI unless specifically indicated above     Objective:    BP 126/76 mmHg  Pulse 118  Temp(Src) 98.3 F (36.8 C) (Oral)  Ht 5\' 3"  (1.6 m)  Wt 204 lb (92.534 kg)  BMI 36.15 kg/m2  SpO2 99%  Wt Readings from Last 3 Encounters:  09/06/14 204 lb (92.534 kg)  08/23/14 202 lb (91.627 kg)  08/14/14 202 lb (91.627 kg)    Physical Exam   Scarring from shingles across right breast, under and down right arm    Assessment & Plan:   Problem List Items Addressed This Visit    None    Visit Diagnoses    Shingles    -  Primary    healing, but continues to struggle with pain    Post herpetic neuralgia        Pt does not want to increase her Gabapentin.  Sill try Tylenol 3-4 times/day.  Give rx for Lidoderm patches.  Discussed cost.         Follow up plan:  Will try Lidoderm solution as cost of patches is prohibitive.  Take Tylenol short term as above.

## 2014-09-06 NOTE — Telephone Encounter (Signed)
I attempted to contact the pt to reschedule the appointment for this afternoon. I called all numbers we have on file: I left a voicemail on the cell, the home # is no longer a working number, and the pt is no longer employed at the employer we have on file, the emergency contact information we have on file is also incorrect. I am cancelling the appt for today. Please advise if pt calls.

## 2014-09-06 NOTE — Patient Instructions (Signed)
Shingles Shingles (herpes zoster) is an infection that is caused by the same virus that causes chickenpox (varicella). The infection causes a painful skin rash and fluid-filled blisters, which eventually break open, crust over, and heal. It may occur in any area of the body, but it usually affects only one side of the body or face. The pain of shingles usually lasts about 1 month. However, some people with shingles may develop long-term (chronic) pain in the affected area of the body. Shingles often occurs many years after the person had chickenpox. It is more common:  In people older than 50 years.  In people with weakened immune systems, such as those with HIV, AIDS, or cancer.  In people taking medicines that weaken the immune system, such as transplant medicines.  In people under great stress. CAUSES  Shingles is caused by the varicella zoster virus (VZV), which also causes chickenpox. After a person is infected with the virus, it can remain in the person's body for years in an inactive state (dormant). To cause shingles, the virus reactivates and breaks out as an infection in a nerve root. The virus can be spread from person to person (contagious) through contact with open blisters of the shingles rash. It will only spread to people who have not had chickenpox. When these people are exposed to the virus, they may develop chickenpox. They will not develop shingles. Once the blisters scab over, the person is no longer contagious and cannot spread the virus to others. SIGNS AND SYMPTOMS  Shingles shows up in stages. The initial symptoms may be pain, itching, and tingling in an area of the skin. This pain is usually described as burning, stabbing, or throbbing.In a few days or weeks, a painful red rash will appear in the area where the pain, itching, and tingling were felt. The rash is usually on one side of the body in a band or belt-like pattern. Then, the rash usually turns into fluid-filled  blisters. They will scab over and dry up in approximately 2-3 weeks. Flu-like symptoms may also occur with the initial symptoms, the rash, or the blisters. These may include:  Fever.  Chills.  Headache.  Upset stomach. DIAGNOSIS  Your health care provider will perform a skin exam to diagnose shingles. Skin scrapings or fluid samples may also be taken from the blisters. This sample will be examined under a microscope or sent to a lab for further testing. TREATMENT  There is no specific cure for shingles. Your health care provider will likely prescribe medicines to help you manage the pain, recover faster, and avoid long-term problems. This may include antiviral drugs, anti-inflammatory drugs, and pain medicines. HOME CARE INSTRUCTIONS   Take a cool bath or apply cool compresses to the area of the rash or blisters as directed. This may help with the pain and itching.   Take medicines only as directed by your health care provider.   Rest as directed by your health care provider.  Keep your rash and blisters clean with mild soap and cool water or as directed by your health care provider.  Do not pick your blisters or scratch your rash. Apply an anti-itch cream or numbing creams to the affected area as directed by your health care provider.  Keep your shingles rash covered with a loose bandage (dressing).  Avoid skin contact with:  Babies.   Pregnant women.   Children with eczema.   Elderly people with transplants.   People with chronic illnesses, such as leukemia   or AIDS.   Wear loose-fitting clothing to help ease the pain of material rubbing against the rash.  Keep all follow-up visits as directed by your health care provider.If the area involved is on your face, you may receive a referral for a specialist, such as an eye doctor (ophthalmologist) or an ear, nose, and throat (ENT) doctor. Keeping all follow-up visits will help you avoid eye problems, chronic pain, or  disability.  SEEK IMMEDIATE MEDICAL CARE IF:   You have facial pain, pain around the eye area, or loss of feeling on one side of your face.  You have ear pain or ringing in your ear.  You have loss of taste.  Your pain is not relieved with prescribed medicines.   Your redness or swelling spreads.   You have more pain and swelling.  Your condition is worsening or has changed.   You have a fever. MAKE SURE YOU:  Understand these instructions.  Will watch your condition.  Will get help right away if you are not doing well or get worse. Document Released: 03/17/2005 Document Revised: 08/01/2013 Document Reviewed: 10/30/2011 [facility] Patient Information 2015 [place], [place]. This information is not intended to replace advice given to you by your health care provider. Make sure you discuss any questions you have with your health care provider. Postherpetic Neuralgia Postherpetic neuralgia (PHN) is nerve pain that occurs after a shingles infection. Shingles is a painful rash that appears on one side of the body, usually on your trunk or face. Shingles is caused by the varicella-zoster virus. This is the same virus that causes chickenpox. In people who have had chickenpox, the virus can resurface years later and cause shingles. You may have PHN if you continue to have pain for 3 months after your shingles rash has gone away. PHN appears in the same area where you had the shingles rash. For most people, PHN goes away within 1 year.  Getting a vaccination for shingles can prevent PHN. This vaccine is recommended for people older than 50. It may prevent shingles and may also lower your risk of PHN if you do get shingles. CAUSES PHN is caused by damage to your nerves from the varicella-zoster virus. This damage makes your nerves overly sensitive.  RISK FACTORS Aging is the biggest risk factor for developing PHN. Most people who get PHN are older than 71. Other risk factors  include:  Having very bad pain before your shingles rash starts.  Having a very bad rash.  Having shingles in the nerve that supplies your face and eye (trigeminal nerve). SIGNS AND SYMPTOMS Pain is the main symptom of PHN. The pain is often very bad and may be described as stabbing, burning, or feeling like an electric shock. The pain may come and go or may be there all the time. Pain may be triggered by light touches on the skin or changes in temperature. You may have itching along with the pain. DIAGNOSIS  Your health care provider may diagnose PHN based on your symptoms and your history of shingles. Lab studies and other diagnostic tests are usually not needed. TREATMENT  There is no cure for PHN. Treatment for PHN will focus on pain relief. Over-the-counter pain relievers do not usually relieve PHN pain. You may need to work with a pain specialist. Treatment may include:  Antidepressant medicines to help with pain and improve sleep.  Antiseizure medicines to relieve nerve pain.  Strong pain relievers (opioids).  A numbing patch worn on the skin (  lidocaine patch). HOME CARE INSTRUCTIONS It may take a long time to recover from PHN. Work closely with your health care provider, and have a good support system at home.   Take all medicines as directed by your health care provider.  Wear loose, comfortable clothing.  Cover sensitive areas with a dressing to reduce friction from clothing rubbing on the area.  If cold does not make your pain worse, try applying a cool compress or cooling gel pack to the area.  Talk to your health care provider if you feel depressed or desperate. Living with long-term pain can be depressing. SEEK MEDICAL CARE IF:  Your medicine is not helping.  You are struggling to manage your pain at home. Document Released: 06/07/2002 Document Revised: 08/01/2013 Document Reviewed: 03/08/2013 [facility] Patient Information 2015 [place], [place]. This information is  not intended to replace advice given to you by your health care provider. Make sure you discuss any questions you have with your health care provider.

## 2014-09-12 ENCOUNTER — Telehealth: Payer: Self-pay | Admitting: *Deleted

## 2014-09-12 NOTE — Telephone Encounter (Signed)
Patient called and needs a refill of her Lorazepam. Call patient when RS is ready for pick up.

## 2014-09-12 NOTE — Telephone Encounter (Signed)
Too early She was given 30+2 refills on March 23rd Please contact [facility] Per the [facility], there appears to be an outstanding refill from the March prescription There was no refill provided on the site in May It's too early to pick up anyway

## 2014-09-13 NOTE — Telephone Encounter (Signed)
Left message for patient to call back  

## 2014-09-13 NOTE — Telephone Encounter (Signed)
Patient called back and stated she did realize she had a refill.

## 2014-09-21 ENCOUNTER — Telehealth: Payer: Self-pay

## 2014-09-21 ENCOUNTER — Encounter: Payer: Self-pay | Admitting: Family Medicine

## 2014-09-21 ENCOUNTER — Telehealth: Payer: Self-pay | Admitting: Family Medicine

## 2014-09-21 ENCOUNTER — Ambulatory Visit (INDEPENDENT_AMBULATORY_CARE_PROVIDER_SITE_OTHER): Payer: Commercial Managed Care - HMO | Admitting: Family Medicine

## 2014-09-21 VITALS — BP 162/77 | HR 87 | Temp 97.6°F | Ht 63.5 in | Wt 199.8 lb

## 2014-09-21 DIAGNOSIS — E785 Hyperlipidemia, unspecified: Secondary | ICD-10-CM | POA: Diagnosis not present

## 2014-09-21 DIAGNOSIS — R05 Cough: Secondary | ICD-10-CM

## 2014-09-21 DIAGNOSIS — B0229 Other postherpetic nervous system involvement: Secondary | ICD-10-CM | POA: Diagnosis not present

## 2014-09-21 DIAGNOSIS — Z5181 Encounter for therapeutic drug level monitoring: Secondary | ICD-10-CM

## 2014-09-21 DIAGNOSIS — Z111 Encounter for screening for respiratory tuberculosis: Secondary | ICD-10-CM

## 2014-09-21 DIAGNOSIS — Z7901 Long term (current) use of anticoagulants: Secondary | ICD-10-CM | POA: Diagnosis not present

## 2014-09-21 DIAGNOSIS — R634 Abnormal weight loss: Secondary | ICD-10-CM

## 2014-09-21 DIAGNOSIS — IMO0001 Reserved for inherently not codable concepts without codable children: Secondary | ICD-10-CM

## 2014-09-21 DIAGNOSIS — I4892 Unspecified atrial flutter: Secondary | ICD-10-CM

## 2014-09-21 DIAGNOSIS — R059 Cough, unspecified: Secondary | ICD-10-CM

## 2014-09-21 DIAGNOSIS — Z794 Long term (current) use of insulin: Secondary | ICD-10-CM

## 2014-09-21 DIAGNOSIS — E1165 Type 2 diabetes mellitus with hyperglycemia: Secondary | ICD-10-CM

## 2014-09-21 DIAGNOSIS — I499 Cardiac arrhythmia, unspecified: Secondary | ICD-10-CM | POA: Diagnosis not present

## 2014-09-21 DIAGNOSIS — Z87891 Personal history of nicotine dependence: Secondary | ICD-10-CM

## 2014-09-21 DIAGNOSIS — I4891 Unspecified atrial fibrillation: Secondary | ICD-10-CM

## 2014-09-21 DIAGNOSIS — E782 Mixed hyperlipidemia: Secondary | ICD-10-CM | POA: Insufficient documentation

## 2014-09-21 HISTORY — DX: Long term (current) use of anticoagulants: Z79.01

## 2014-09-21 HISTORY — DX: Encounter for therapeutic drug level monitoring: Z51.81

## 2014-09-21 LAB — CBC WITH DIFFERENTIAL/PLATELET
Hematocrit: 43.1 % (ref 34.0–46.6)
Hemoglobin: 14.6 g/dL (ref 11.1–15.9)
Lymphocytes Absolute: 1.5 10*3/uL (ref 0.7–3.1)
Lymphs: 27 %
MCH: 29.9 pg (ref 26.6–33.0)
MCHC: 33.9 g/dL (ref 31.5–35.7)
MCV: 88 fL (ref 79–97)
MID (Absolute): 0.5 10*3/uL (ref 0.1–1.6)
MID: 9 %
NEUTROS PCT: 64 %
Neutrophils Absolute: 3.6 10*3/uL (ref 1.4–7.0)
PLATELETS: 244 10*3/uL (ref 150–379)
RBC: 4.89 x10E6/uL (ref 3.77–5.28)
RDW: 14.4 % (ref 12.3–15.4)
WBC: 5.6 10*3/uL (ref 3.4–10.8)

## 2014-09-21 LAB — COAGUCHEK XS/INR WAIVED
INR: 2.5 — ABNORMAL HIGH (ref 0.9–1.1)
Prothrombin Time: 30.5 s

## 2014-09-21 MED ORDER — CAPSAICIN 0.025 % EX CREA: TOPICAL_CREAM | CUTANEOUS | Status: DC

## 2014-09-21 NOTE — Assessment & Plan Note (Addendum)
Ongoing pain, now about 6 weeks since diagnosis; oxycodone did not give adequate refli; she does not like how gabapentin made her feel; she has taken that sporadically, does not need to be weaned since she not regular medicine; explained that's not how it was meant to be taken, was supposed to be weaned up and then weaned off; she's only had a single pill recently in last week so just stop; trial of Zostrix, use test patch; refer to pain clinic for management; vesicles have completely dried up, gone, healing lesions; she is not contagious and I told her from a shingles standpoint, I don't have a reason to keep her out of work for THIS problem (others, notwithstanding, though)

## 2014-09-21 NOTE — Progress Notes (Signed)
BP 162/77 mmHg  Pulse 87  Temp(Src) 97.6 F (36.4 C)  Ht 5' 3.5" (1.613 m)  Wt 199 lb 12.8 oz (90.629 kg)  BMI 34.83 kg/m2  SpO2 98%  LMP  (LMP Unknown)   Subjective:    Patient ID: Daisy Watkins, female    DOB: [birth year], 81 y.o.   MRN: [id number]  HPI: Daisy Watkins is a 81 y.o. female  Chief Complaint  Patient presents with  . Herpes Zoster  . Nasal Congestion  . Cough   She first broke out with shingles around Mother's Day Her pain level is not a 10 "but it's there", 8 out of 10 currently She is taking tylenol but no more oxycodone; last dose of oxycodone was probably a week or so ago, just wasn't helping When she lays down at night, really hurts; across the chest and under the armpit and across the back She took one gabapentin yesterday, but she thinks it makes her worse She is losing weight and feels sick to her stomach; thought it was the medicine; she is losing weight she says because she's not hungry She is having a cough and a little phlegm; sinuses are running and draining, something ain't right, has a headache and can't smell; I asked when these started; the cough started a week or more ago; the smell problem started a week or more ago She asked for TB screening which she needs for work Her boss told her to stay out of work, if she hurts, there is something going on; she asked for work note until July 8th She is on chronic anticoagulation; she had a DVT but also reports some sort of filter or procedure done and has been planning to see a vascular doctor for months now to see if okay to stop anticoagulation; her INR is drawn today  Relevant past medical, surgical, family and social history reviewed and updated as indicated. Interim medical history since our last visit reviewed. Allergies and medications reviewed and updated.  Review of Systems  Respiratory: Positive for cough. Negative for shortness of breath.   Cardiovascular: Negative for chest pain, palpitations  and leg swelling.  Gastrointestinal: Negative for abdominal pain.  Hematological: Does not bruise/bleed easily.   Per HPI unless specifically indicated above     Objective:    BP 162/77 mmHg  Pulse 87  Temp(Src) 97.6 F (36.4 C)  Ht 5' 3.5" (1.613 m)  Wt 199 lb 12.8 oz (90.629 kg)  BMI 34.83 kg/m2  SpO2 98%  LMP  (LMP Unknown)  Wt Readings from Last 3 Encounters:  09/21/14 199 lb 12.8 oz (90.629 kg)  09/06/14 204 lb (92.534 kg)  08/23/14 202 lb (91.627 kg)    Recheck BP 162/77 and pulse 87  Physical Exam  Constitutional: She appears well-developed and well-nourished. No distress.  HENT:  Mouth/Throat: No oropharyngeal exudate.  Eyes: No scleral icterus.  Neck: No JVD present.  Cardiovascular: An irregularly irregular rhythm present. Tachycardia present.   Pulmonary/Chest: Effort normal and breath sounds normal. No respiratory distress. She has no wheezes. She has no rales.  Abdominal: Soft. Bowel sounds are normal. She exhibits no distension. There is no tenderness.  Musculoskeletal: She exhibits no edema.  Neurological: She is alert.  Skin: Skin is warm and dry. Rash noted. No bruising noted. She is not diaphoretic. No cyanosis.  Resolving rash over the right side chest, axilla, upper back on the right side; no remaining vesicles  Psychiatric: She has a normal mood  and affect. Her behavior is normal. Judgment and thought content normal.   Diabetic Foot Form - Detailed   Diabetic Foot Exam - detailed  Diabetic Foot exam was performed with the following findings:  Yes 09/21/2014  1:41 PM  Visual Foot Exam completed.:  Yes  Is there a history of foot ulcer?:  No  Can the patient see the bottom of their feet?:  Yes  Are the shoes appropriate in style and fit?:  Yes  Are the toenails long?:  No  Are the toenails thick?:  No  Are the toenails ingrown?:  No    Pulse Foot Exam completed.:  Yes  Right Dorsalis Pedis:  Present Left Dorsalis Pedis:  Present  Sensory Foot  Exam Completed.:  Yes  Semmes-Weinstein Monofilament Test  R Site 1-Great Toe:  Pos L Site 1-Great Toe:  Pos  R Site 4:  Pos L Site 4:  Pos  R Site 5:  Pos L Site 5:  Pos    Comments:  Intact to vibration and monofilament     Lab Results  Component Value Date   INR 2.5* 09/21/2014   INR 1.4 01/27/2014  (several INR values missing as they were in [facility], [facility] which did not cross over to [facility])     Assessment & Plan:   Problem List Items Addressed This Visit      Nervous and Auditory   Postzoster neuralgia - Primary    Ongoing pain, now about 6 weeks since diagnosis; oxycodone did not give adequate refli; she does not like how gabapentin made her feel; she has taken that sporadically, does not need to be weaned since she not regular medicine; explained that's not how it was meant to be taken, was supposed to be weaned up and then weaned off; she's only had a single pill recently in last week so just stop; trial of Zostrix, use test patch; refer to pain clinic for management      Relevant Orders   Ambulatory referral to Pain Clinic     Other   Insulin long-term use    Noted; no adjustments made today, as A1C is pending      Relevant Orders   Hgb A1c w/o eAG   Monitoring for anticoagulant use    INR today was 2.5; no change to coumadin dose      Relevant Orders   Protime-INR   Diabetes mellitus type 2, uncontrolled, without complications    Reviewed last A1C; due for check today; foot exam by MD today      Relevant Orders   Hgb A1c w/o eAG   Hyperlipidemia    Last lipid panel showed she was not to goal; check today on 40 mg of atorvastatin; goal HDL over 50, goal LDL under 70 ideally, but at least under 100      Relevant Orders   Comprehensive metabolic panel   Lipid Panel w/o Chol/HDL Ratio    Other Visit Diagnoses    Cough        may be related to currently afib/flutter; however, previous smoker; will get chest CT; explained concern to patient since  she has had smoking hx    Relevant Orders    CBC With Differential/Platelet    CT Chest W Contrast    History of smoking for more than 10 years        chest CT    Relevant Orders    CT Chest W Contrast    Irregular heart rhythm  Relevant Orders    TSH    EKG 12-Lead (Completed)    Weight loss, abnormal        TSH today, as that could contribute to afib/flutter; hx of smoking, chest CT to r/o malignancy    Relevant Orders    Comprehensive metabolic panel    CBC With Differential/Platelet    TSH    CT Chest W Contrast    Medication monitoring encounter        Relevant Orders    Comprehensive metabolic panel    Atrial fibrillation and flutter        noted on EKG    Relevant Orders    Ambulatory referral to Cardiology    Tuberculosis screening        Relevant Orders    Quantiferon tb gold assay       Quick summary: Patient refused to go to the hospital today She says she'll go see the cardiologist tomorrow but she's hungry now and wants to leave Reasons to go to ER reviewed since she refuses to go now Start zostrix for shingles pain; test patch Multiple labs pending Refer to pain clinic  Follow up plan: Return in about 1 week (around 09/28/2014) for multiple issues.  Orders Placed This Encounter  Procedures  . CT Chest W Contrast    Standing Status: Future     Number of Occurrences:      Standing Expiration Date: 10/21/2014    Scheduling Instructions:     This is NOT low dose for just screening purposes; this is normal chest CT, recent weight loss, cough, hx of smoking; labs drawn September 21, 2014    Order Specific Question:  Reason for Exam (SYMPTOM  OR DIAGNOSIS REQUIRED)    Answer:  cough, hx of smoking    Order Specific Question:  Preferred imaging location?    Answer:  [facility]  . Protime-INR  . Hgb A1c w/o eAG  . Comprehensive metabolic panel    Order Specific Question:  Has the patient fasted?    Answer:  Yes  . Lipid Panel w/o Chol/HDL Ratio     Order Specific Question:  Has the patient fasted?    Answer:  Yes  . CBC With Differential/Platelet  . TSH  . Quantiferon tb gold assay  . CBC With Differential/Platelet  . CoaguChek XS/INR Waived  . Ambulatory referral to Pain Clinic    Referral Priority:  Medium    Referral Type:  Consultation    Referral Reason:  Specialty Services Required    Requested Specialty:  Pain Medicine    Number of Visits Requested:  1  . Ambulatory referral to Cardiology    Referral Priority:  Urgent    Referral Type:  Consultation    Referral Reason:  Specialty Services Required    Requested Specialty:  Cardiology    Number of Visits Requested:  1  . EKG 12-Lead

## 2014-09-21 NOTE — Patient Instructions (Addendum)
We will have you see a cardiologist as soon as they can get you in for your new diagnosis of atrial fibillation / flutter Continue current blood thinner, recheck INR in one month Do not take any more of the gabapentin Okay to return to work on Monday; if you choose to be out until July 8th, that is up to you but not excused by me We'll refer you to the pain clinic We'll get a chest CT with your history of smoking If you develop fast heart rate, feel weak and tired, develop chest pain, etc., call 911+

## 2014-09-21 NOTE — Telephone Encounter (Signed)
I reviewed the message; thank you, Tiffany, for trying to reach her ------------------------ I called the home number; not working but contains the same numbers as her mobile in different order I called the mobile number and reached her She says the home number is wrong She said she got a message from her son that we called but she was not told by her son to go to the ER I talked with her about the events earlier today, trying to get her seen, then trying to reach her, cardiologist's office wants her to go to the ER She does not want to go; I told her that my recommendation and the recommendation of the cardiologist, for her to go to the ER now She still does not want to go, and says that if she feels bad, she'll call the ambulance; she denies chest pain or fluttering ------------------------- [facility] staff, please get her in to see a heart doctor Friday; if there is no one nearby in the [facility] system who can see her, you have my okay to try outside the system to get her seen Friday for new onset atrial fibrillation/flutter Thank you, Dr. Sanda Klein

## 2014-09-21 NOTE — Assessment & Plan Note (Signed)
Noted; no adjustments made today, as A1C is pending

## 2014-09-21 NOTE — Telephone Encounter (Signed)
Pt's PCP office called requesting appt today for new onset afib/flutter. Spoke w/ Tiffany.  Advised her that Dr. Rockey Situ is the only provider in the office today and is double booked this afternoon; we don't have anywhere to squeeze a pt in tomorrow.  Advised her that I will have him review pt's EKG, but he is currently in w/ a new pt.  Discussed w/ her that I do not have a previous EKG to compare any changes to, that pt's HR is 83 and she is on coumadin. Recommended that she advise pt to proceed to ED if she is symptomatic, as they may be able to cardiovert her.  She verbalizes understanding and will call back if we can be of further assistance.

## 2014-09-21 NOTE — Telephone Encounter (Signed)
Attempted to contact pt, but no answer at either number provided. Spoke w/ pt's son Daisy Watkins.  Advised him that Daisy Faith, PA reviewed pt's EKG and that pt is no immediate danger, as she is on coumadin and her HR is WNL. He recommends that pt call back to schedule a new pt appt w/ our office at her convenience, that she does not need to proceed to the ED.  He verbalizes understanding and is appreciative of the call.

## 2014-09-21 NOTE — Assessment & Plan Note (Signed)
INR today was 2.5; no change to coumadin dose

## 2014-09-21 NOTE — Assessment & Plan Note (Signed)
Reviewed last A1C; due for check today; foot exam by MD today

## 2014-09-21 NOTE — Assessment & Plan Note (Signed)
Last lipid panel showed she was not to goal; check today on 40 mg of atorvastatin; goal HDL over 50, goal LDL under 70 ideally, but at least under 100

## 2014-09-23 ENCOUNTER — Other Ambulatory Visit: Payer: Self-pay | Admitting: Family Medicine

## 2014-09-25 ENCOUNTER — Ambulatory Visit
Admission: RE | Admit: 2014-09-25 | Discharge: 2014-09-25 | Disposition: A | Discharge status: Home / Self Care | Payer: Commercial Managed Care - HMO | Source: Ambulatory Visit | Attending: Family Medicine | Admitting: Family Medicine

## 2014-09-25 ENCOUNTER — Ambulatory Visit
Admission: EM | Admit: 2014-09-25 | Discharge: 2014-09-25 | Disposition: A | Discharge status: Home / Self Care | Payer: Commercial Managed Care - HMO | Source: Ambulatory Visit | Attending: Family Medicine | Admitting: Family Medicine

## 2014-09-25 ENCOUNTER — Telehealth: Payer: Self-pay

## 2014-09-25 DIAGNOSIS — Z87891 Personal history of nicotine dependence: Secondary | ICD-10-CM | POA: Insufficient documentation

## 2014-09-25 DIAGNOSIS — R634 Abnormal weight loss: Secondary | ICD-10-CM | POA: Diagnosis not present

## 2014-09-25 DIAGNOSIS — I2510 Atherosclerotic heart disease of native coronary artery without angina pectoris: Secondary | ICD-10-CM | POA: Diagnosis not present

## 2014-09-25 DIAGNOSIS — R05 Cough: Secondary | ICD-10-CM

## 2014-09-25 DIAGNOSIS — K760 Fatty (change of) liver, not elsewhere classified: Secondary | ICD-10-CM | POA: Diagnosis not present

## 2014-09-25 DIAGNOSIS — R059 Cough, unspecified: Secondary | ICD-10-CM

## 2014-09-25 DIAGNOSIS — J984 Other disorders of lung: Secondary | ICD-10-CM | POA: Diagnosis not present

## 2014-09-25 IMAGING — CT CT CHEST W/ CM
2 of 3 series · 15 of 36 positions shown, 18 images · IV contrast (omnipaque)
Comparison: None.

CLINICAL DATA: Three-week history of productive cough

EXAM:
CT CHEST WITH CONTRAST
TECHNIQUE: Multidetector CT imaging of the chest was performed during
intravenous contrast administration.
CONTRAST:  75mL OMNIPAQUE IOHEXOL 300 MG/ML  SOLN

[Series 2: soft tissue · axial · 0.68mm/px · z∈[-598,-343]mm · 12 of 61 slices shown, 15 images]
[im 5/61  mediastinal]
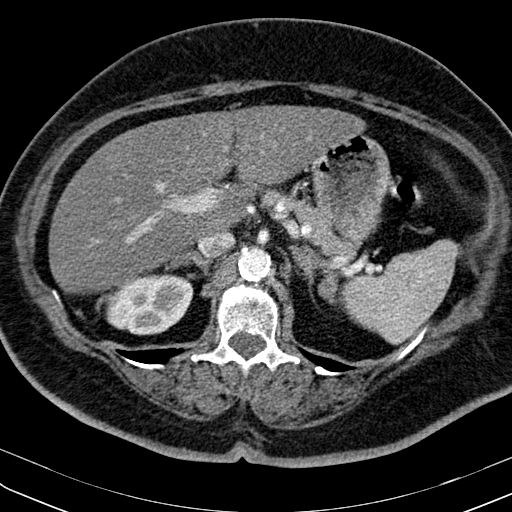
[im 5/61  lung]
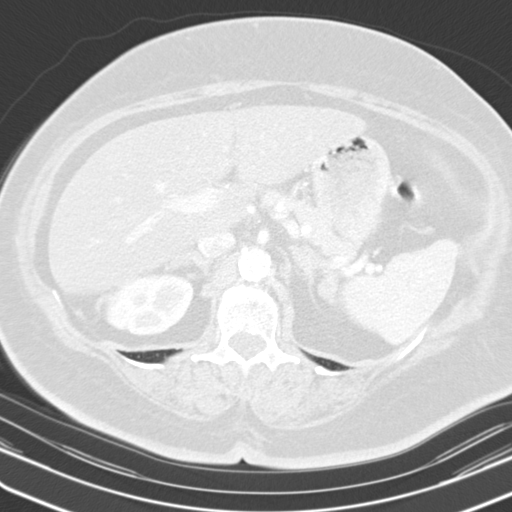
[im 9/61  lung]
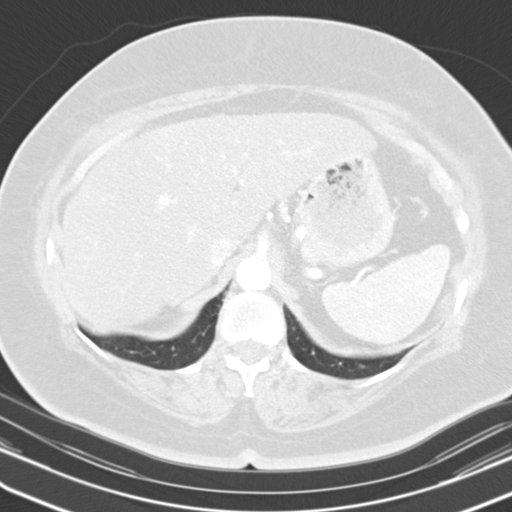
[im 14/61  lung]
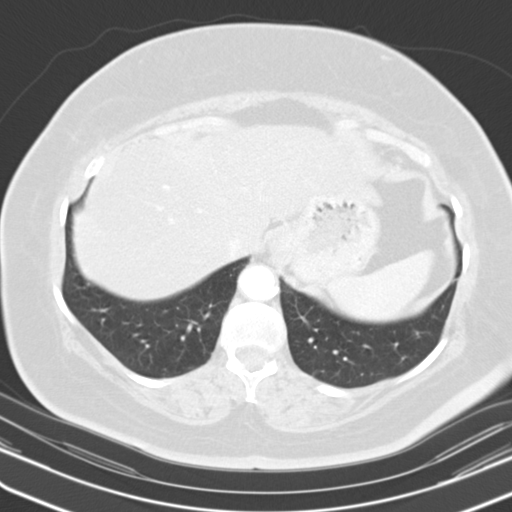
[im 18/61  lung]
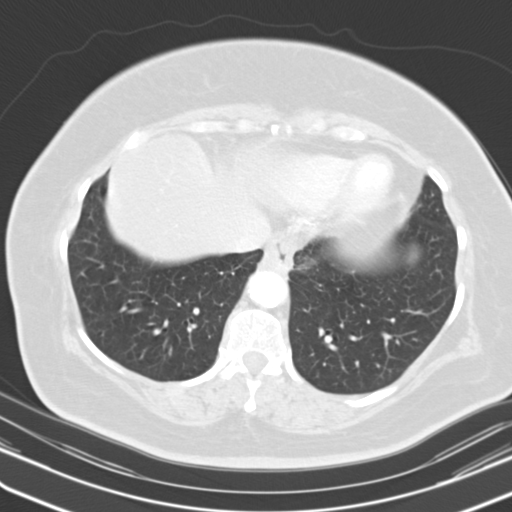
[im 23/61  mediastinal]
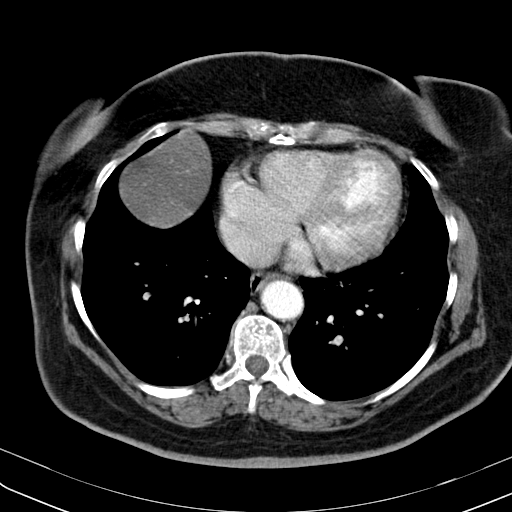
[im 23/61  lung]
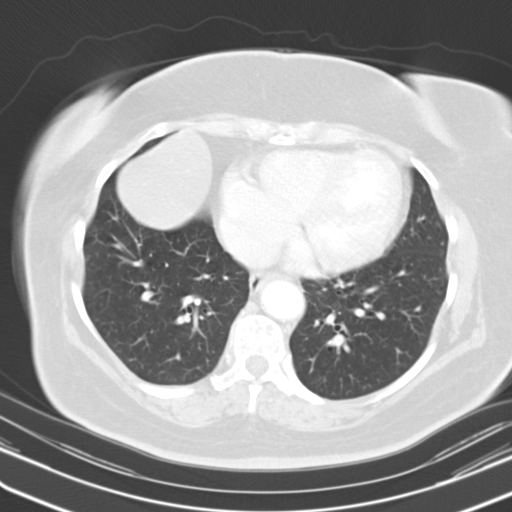
[im 27/61  lung]
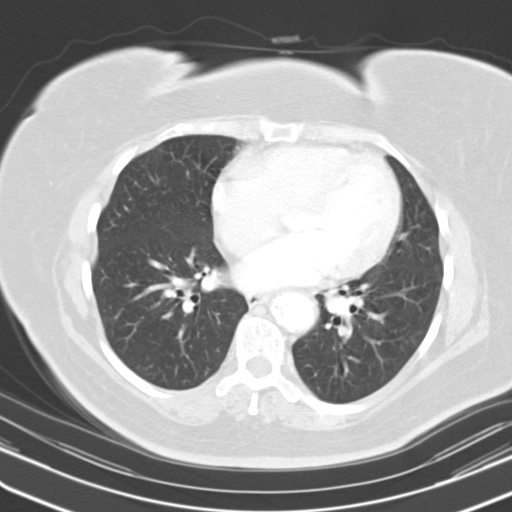
[im 34/61  lung]
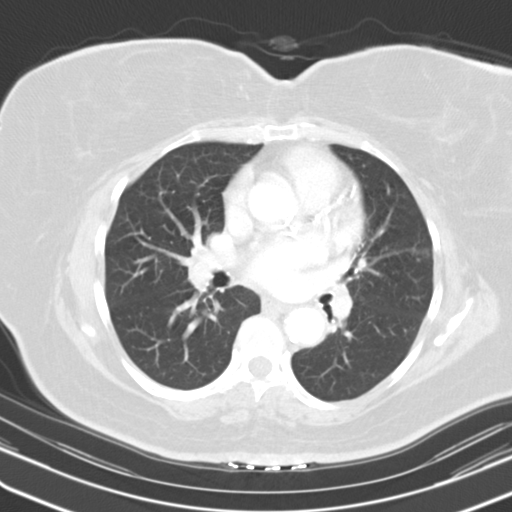
[im 38/61  lung]
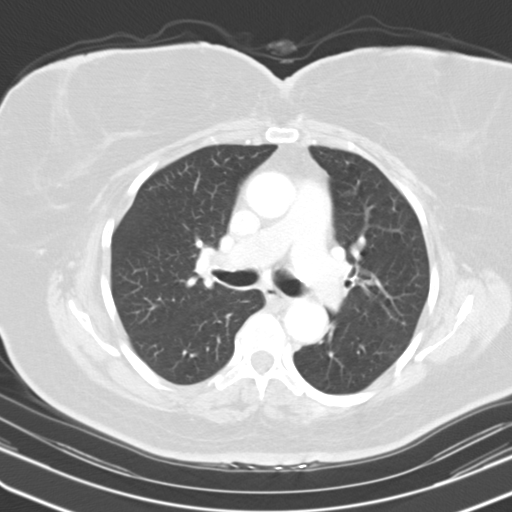
[im 43/61  mediastinal]
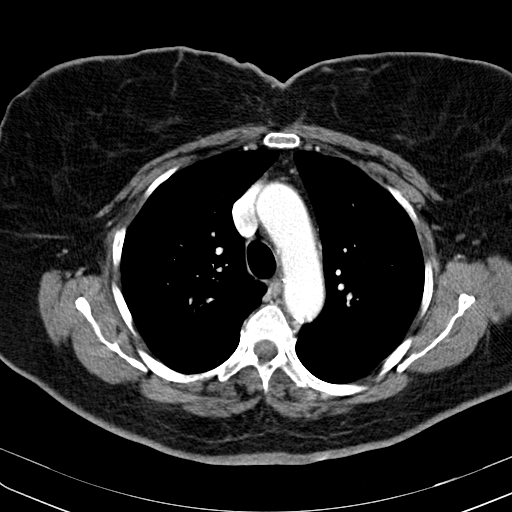
[im 43/61  lung]
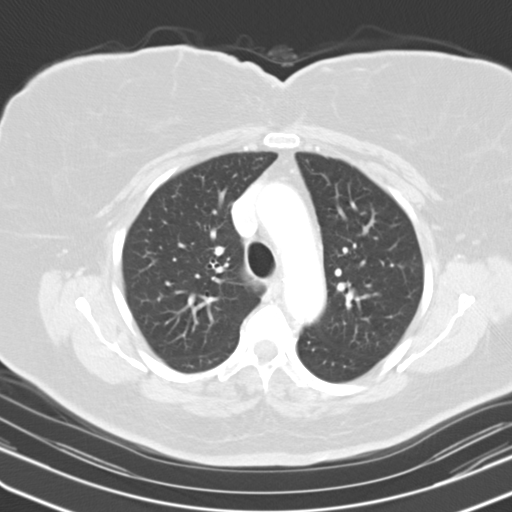
[im 47/61  lung]
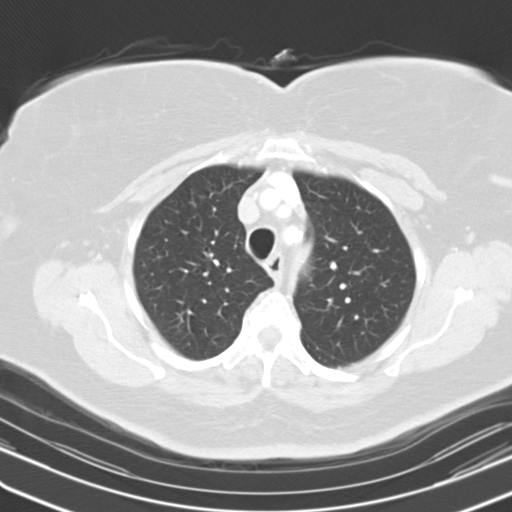
[im 52/61  lung]
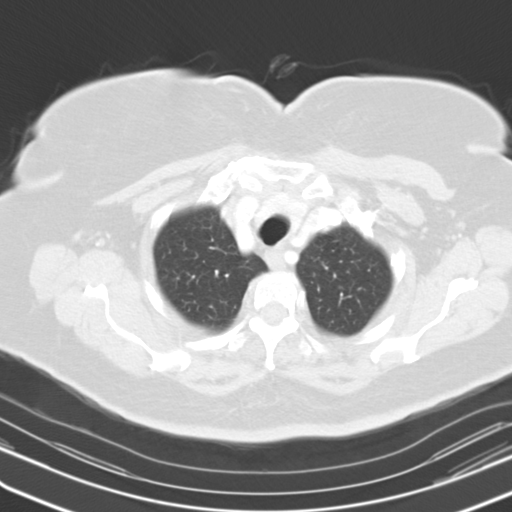
[im 56/61  lung]
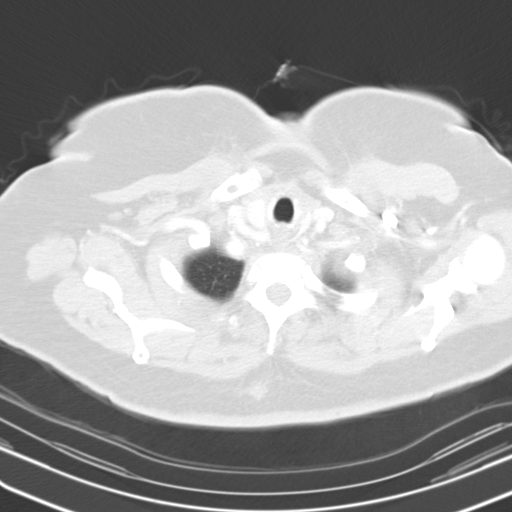

[Series 602: coronals · coronal · 0.68mm/px · 3 of 154 slices shown]
[im 31/154  lung]
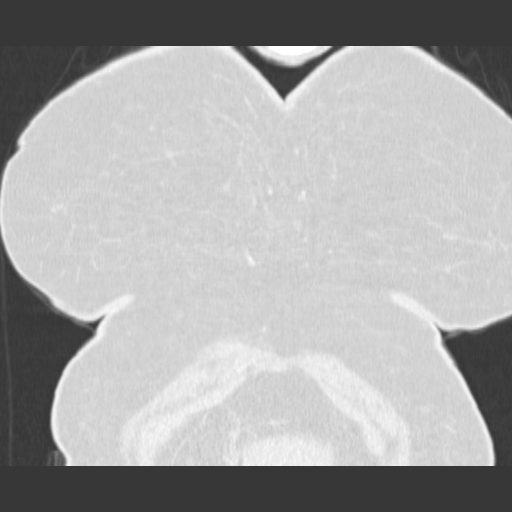
[im 62/154  lung]
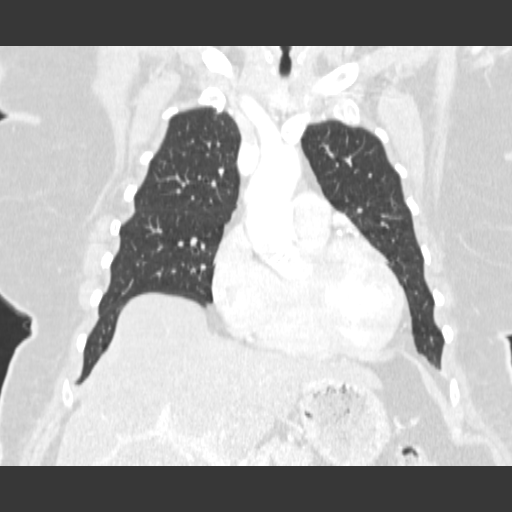
[im 92/154  lung]
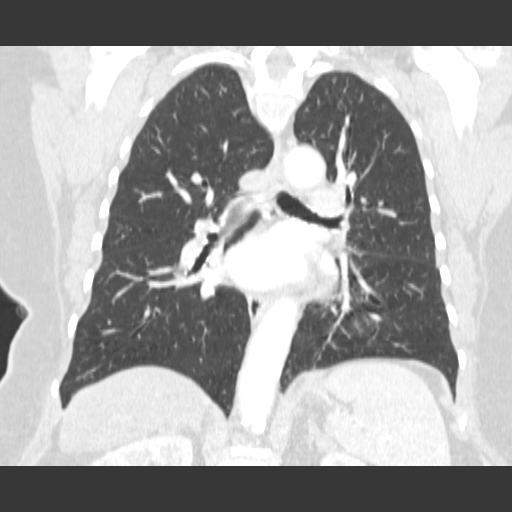

[15 of 36 positions shown; findings below may reference images not displayed]

FINDINGS: There are scattered areas of mild scarring in each lower lobe. There
is no edema or consolidation. No parenchymal lung mass is identified
on this study.

There is a small calcification in the right lobe of the thyroid. The
thyroid otherwise appears normal. There is atherosclerotic change in
the aorta but no aneurysm or dissection. No pulmonary embolus is
appreciable.

There is no appreciable thoracic adenopathy. There is extensive
coronary artery calcification. Pericardium is not thickened.

There is a small hiatal hernia.

In the visualized upper abdomen, there is hepatic steatosis. There
is a foramen of Morgagni hernia on the right which contains liver.
There is atherosclerotic change in the aorta. There is mild
bilateral adrenal hypertrophy. The tip of a filter in the inferior
vena cava is appreciable.

There are no blastic or lytic bone lesions.
IMPRESSION: Mild bibasilar lung scarring. No lung edema or consolidation. No
parenchymal lung mass or adenopathy. There is extensive coronary
artery calcification. There is mild bilateral adrenal hypertrophy.
There is a foramen of Morgagni hernia on the right which contains
liver. There is hepatic steatosis.

## 2014-09-25 MED ORDER — IOHEXOL 300 MG/ML  SOLN
75.0000 mL | Freq: Once | INTRAMUSCULAR | Status: AC | PRN
  Administered 2014-09-25: 75 mL via INTRAVENOUS

## 2014-09-25 NOTE — Telephone Encounter (Signed)
She wanted to let you know she has an appointment with cardio tomorrow at 11:30am.

## 2014-09-25 NOTE — Telephone Encounter (Signed)
I'm glad to hear that

## 2014-09-26 ENCOUNTER — Telehealth: Payer: Self-pay

## 2014-09-26 ENCOUNTER — Encounter: Payer: Self-pay | Admitting: Family Medicine

## 2014-09-26 DIAGNOSIS — E782 Mixed hyperlipidemia: Secondary | ICD-10-CM | POA: Diagnosis not present

## 2014-09-26 DIAGNOSIS — Z86718 Personal history of other venous thrombosis and embolism: Secondary | ICD-10-CM | POA: Insufficient documentation

## 2014-09-26 DIAGNOSIS — I10 Essential (primary) hypertension: Secondary | ICD-10-CM | POA: Diagnosis not present

## 2014-09-26 DIAGNOSIS — I4891 Unspecified atrial fibrillation: Secondary | ICD-10-CM | POA: Diagnosis not present

## 2014-09-26 DIAGNOSIS — K219 Gastro-esophageal reflux disease without esophagitis: Secondary | ICD-10-CM | POA: Diagnosis not present

## 2014-09-26 DIAGNOSIS — I4892 Unspecified atrial flutter: Secondary | ICD-10-CM | POA: Diagnosis not present

## 2014-09-26 NOTE — Telephone Encounter (Signed)
-----   Message from [name], MD sent at 09/26/2014  1:10 PM EDT ----- Regarding: Quantiferon gold assay I don't see results of quantiferon gold assay (TB screen); she needs this for work Please check on it, and if not done, offer to patient to come back for test Thank you, Dr. Sanda Klein ----- Message -----    From: SYSTEM    Sent: 09/26/2014  12:04 AM      To: [name], MD

## 2014-09-26 NOTE — Telephone Encounter (Signed)
I spoke with patient, she has appointment on Thursday, will get it drawn then.

## 2014-09-27 LAB — COMPREHENSIVE METABOLIC PANEL
A/G RATIO: 1.8 (ref 1.1–2.5)
ALK PHOS: 96 IU/L (ref 39–117)
ALT: 19 IU/L (ref 0–32)
AST: 17 IU/L (ref 0–40)
Albumin: 4.3 g/dL (ref 3.5–4.7)
BILIRUBIN TOTAL: 0.6 mg/dL (ref 0.0–1.2)
BUN / CREAT RATIO: 8 — AB (ref 11–26)
BUN: 6 mg/dL — ABNORMAL LOW (ref 8–27)
CO2: 25 mmol/L (ref 18–29)
CREATININE: 0.74 mg/dL (ref 0.57–1.00)
Calcium: 10.3 mg/dL (ref 8.7–10.3)
Chloride: 97 mmol/L (ref 97–108)
GFR calc Af Amer: 88 mL/min/{1.73_m2} (ref >59)
GFR, EST NON AFRICAN AMERICAN: 77 mL/min/{1.73_m2} (ref >59)
Globulin, Total: 2.4 g/dL (ref 1.5–4.5)
Glucose: 295 mg/dL — ABNORMAL HIGH (ref 65–99)
Potassium: 4.7 mmol/L (ref 3.5–5.2)
Sodium: 138 mmol/L (ref 134–144)
Total Protein: 6.7 g/dL (ref 6.0–8.5)

## 2014-09-27 LAB — LIPID PANEL W/O CHOL/HDL RATIO
Cholesterol, Total: 255 mg/dL — ABNORMAL HIGH (ref 100–199)
HDL: 52 mg/dL (ref >39)
LDL Calculated: 158 mg/dL — ABNORMAL HIGH (ref 0–99)
Triglycerides: 226 mg/dL — ABNORMAL HIGH (ref 0–149)
VLDL CHOLESTEROL CAL: 45 mg/dL — AB (ref 5–40)

## 2014-09-27 LAB — TSH: TSH: 0.937 u[IU]/mL (ref 0.450–4.500)

## 2014-09-27 LAB — HGB A1C W/O EAG: HEMOGLOBIN A1C: 8 % — AB (ref 4.8–5.6)

## 2014-09-28 ENCOUNTER — Other Ambulatory Visit: Payer: Self-pay | Admitting: Family Medicine

## 2014-09-28 ENCOUNTER — Encounter: Payer: Self-pay | Admitting: Family Medicine

## 2014-09-28 ENCOUNTER — Ambulatory Visit (INDEPENDENT_AMBULATORY_CARE_PROVIDER_SITE_OTHER): Payer: Commercial Managed Care - HMO | Admitting: Family Medicine

## 2014-09-28 VITALS — BP 189/76 | HR 76 | Temp 98.6°F | Wt 200.0 lb

## 2014-09-28 DIAGNOSIS — N3000 Acute cystitis without hematuria: Secondary | ICD-10-CM | POA: Insufficient documentation

## 2014-09-28 DIAGNOSIS — I700 Atherosclerosis of aorta: Secondary | ICD-10-CM | POA: Diagnosis not present

## 2014-09-28 DIAGNOSIS — K760 Fatty (change of) liver, not elsewhere classified: Secondary | ICD-10-CM | POA: Diagnosis not present

## 2014-09-28 DIAGNOSIS — R8290 Unspecified abnormal findings in urine: Secondary | ICD-10-CM | POA: Diagnosis not present

## 2014-09-28 DIAGNOSIS — Z7901 Long term (current) use of anticoagulants: Secondary | ICD-10-CM | POA: Diagnosis not present

## 2014-09-28 DIAGNOSIS — I10 Essential (primary) hypertension: Secondary | ICD-10-CM

## 2014-09-28 DIAGNOSIS — N3001 Acute cystitis with hematuria: Secondary | ICD-10-CM

## 2014-09-28 DIAGNOSIS — Z5181 Encounter for therapeutic drug level monitoring: Secondary | ICD-10-CM

## 2014-09-28 DIAGNOSIS — B0229 Other postherpetic nervous system involvement: Secondary | ICD-10-CM

## 2014-09-28 DIAGNOSIS — I4891 Unspecified atrial fibrillation: Secondary | ICD-10-CM | POA: Diagnosis not present

## 2014-09-28 DIAGNOSIS — R300 Dysuria: Secondary | ICD-10-CM | POA: Diagnosis not present

## 2014-09-28 DIAGNOSIS — I2510 Atherosclerotic heart disease of native coronary artery without angina pectoris: Secondary | ICD-10-CM | POA: Diagnosis not present

## 2014-09-28 DIAGNOSIS — E785 Hyperlipidemia, unspecified: Secondary | ICD-10-CM

## 2014-09-28 DIAGNOSIS — Z111 Encounter for screening for respiratory tuberculosis: Secondary | ICD-10-CM | POA: Diagnosis not present

## 2014-09-28 DIAGNOSIS — N390 Urinary tract infection, site not specified: Secondary | ICD-10-CM | POA: Insufficient documentation

## 2014-09-28 DIAGNOSIS — I480 Paroxysmal atrial fibrillation: Secondary | ICD-10-CM | POA: Insufficient documentation

## 2014-09-28 LAB — MICROSCOPIC EXAMINATION

## 2014-09-28 MED ORDER — NITROFURANTOIN MONOHYD MACRO 100 MG PO CAPS: 100.0000 mg | ORAL_CAPSULE | Freq: Two times a day (BID) | ORAL | Status: DC

## 2014-09-28 NOTE — Assessment & Plan Note (Signed)
Patient has issues with white coat hypertension; she has been reluctant in the past to increase medicines

## 2014-09-28 NOTE — Assessment & Plan Note (Signed)
Reviewed CT findings with her in detail, including the fatty liver; low fat diet, weight loss encouraged

## 2014-09-28 NOTE — Assessment & Plan Note (Addendum)
Work on weight loss, reduce saturated fats, statin; she declined offer to learn about vegetarian / vegan diets

## 2014-09-28 NOTE — Assessment & Plan Note (Signed)
New goal LDL is less than 70;  she declined offer to learn about vegetarian / vegan diets; continue statin, really limit saturated fats

## 2014-09-28 NOTE — Progress Notes (Signed)
BP 189/76 mmHg  Pulse 76  Temp(Src) 98.6 F (37 C)  Wt 200 lb (90.719 kg)  SpO2 98%  LMP  (LMP Unknown)   Subjective:    Patient ID: Daisy Watkins, female    DOB: Dec 16, 1932, 81 y.o.   MRN: [id number]  HPI: Daisy Watkins is a 81 y.o. female  Chief Complaint  Patient presents with  . Hypertension  . Urinary Tract Infection   She thinks she might have a bladder infection; having some itching; no discharge; no blood in the urine; strong smell; lower abdominal pain  Patient was here just last week and was found to be in atrial fibrillation/flutter; she did not want to go to the hospital; she saw Dr. Nehemiah Massed (cardiologist); goes for stress echo on July 14th, and sees him the same day after the test; she was still in afib when he saw her; she is on anticoagulation  She continues to have post-zoster neuralgia on the right side of the chest  She had her chest CT and we reviewed that in detail today  Relevant past medical, surgical, family and social history reviewed and updated as indicated. Interim medical history since our last visit reviewed. Allergies and medications reviewed and updated.  Review of Systems  Per HPI unless specifically indicated above     Objective:    BP 189/76 mmHg  Pulse 76  Temp(Src) 98.6 F (37 C)  Wt 200 lb (90.719 kg)  SpO2 98%  LMP  (LMP Unknown)  Wt Readings from Last 3 Encounters:  09/28/14 200 lb (90.719 kg)  09/21/14 199 lb 12.8 oz (90.629 kg)  09/06/14 204 lb (92.534 kg)    Physical Exam  Constitutional: She appears well-developed and well-nourished.  Weight up 1 pound since last visit  HENT:  Head: Normocephalic.  Eyes: EOM are normal. No scleral icterus.  Neck: No JVD present.  Cardiovascular: Normal rate.  An irregularly irregular rhythm present.  Pulmonary/Chest: Effort normal and breath sounds normal. No respiratory distress. She has no wheezes. She has no rales.  Abdominal: Soft. Normal appearance. She exhibits no distension.   Musculoskeletal:  No lower extremity edema  Neurological: She is alert. She has normal strength. She displays no tremor.  Skin: Skin is warm and dry. No bruising noted. No pallor.  Psychiatric: Her mood appears not anxious. Cognition and memory are not impaired. She does not exhibit a depressed mood.    Results for orders placed or performed in visit on 09/28/14  Microscopic Examination  Result Value Ref Range   WBC, UA 6-10 (A) 0 -  5 /hpf   RBC, UA 3-10 (A) 0 -  2 /hpf   Epithelial Cells (non renal) 0-10 0 - 10 /hpf   Bacteria, UA Moderate (A) None seen/Few  UA/M w/rflx Culture, Routine  Result Value Ref Range   Specific Gravity, UA 1.020 1.005 - 1.030   pH, UA 5.5 5.0 - 7.5   Color, UA Yellow Yellow   Appearance Ur Clear Clear   Leukocytes, UA Trace (A) Negative   Protein, UA Negative Negative/Trace   Glucose, UA 3+ (A) Negative   Ketones, UA 1+ (A) Negative   RBC, UA 1+ (A) Negative   Bilirubin, UA Negative Negative   Urobilinogen, Ur 1.0 0.2 - 1.0 mg/dL   Nitrite, UA Positive (A) Negative   Microscopic Examination See below:    Urinalysis Reflex Comment       Assessment & Plan:   Problem List Items Addressed This Visit  Cardiovascular and Mediastinum   Aortic atherosclerosis    Work on weight loss, reduce saturated fats, statin; she declined offer to learn about vegetarian / vegan diets      Coronary artery disease    Established with Dr. Nehemiah Massed; having stress test on July 14th; on coumadin; on statin; call 911 if needed;  she declined offer to learn about vegetarian / vegan diets      Benign hypertension    Patient has issues with white coat hypertension; she has been reluctant in the past to increase medicines      Atrial fibrillation    On anticoagulation; she has seen cardiologist and is going for stress testing and f/u visit soon; she knows reasons to call 911        Digestive   Fatty liver    Reviewed CT findings with her in detail, including  the fatty liver; low fat diet, weight loss encouraged        Nervous and Auditory   Postzoster neuralgia    Referral entered last week for pain clinic; will ask staff to check on that        Genitourinary   Acute cystitis - Primary     Other   Monitoring for anticoagulant use    Starting antibiotic; will recheck INR 5-6 days after this has been started and hold or adjust coumadin appropriately      Hyperlipidemia    New goal LDL is less than 70;  she declined offer to learn about vegetarian / vegan diets; continue statin, really limit saturated fats       Other Visit Diagnoses    Dysuria        Relevant Orders    UA/M w/rflx Culture, Routine (Completed)    Bad odor of urine        Relevant Orders    UA/M w/rflx Culture, Routine (Completed)       Orders Placed This Encounter  Procedures  . Microscopic Examination  . UA/M w/rflx Culture, Routine     Follow up plan: Return in about 5 days (around 10/03/2014) for for INR visit; 6 weeks with me.  An After Visit Summary was printed and given to the patient.

## 2014-09-28 NOTE — Assessment & Plan Note (Signed)
Starting antibiotic; will recheck INR 5-6 days after this has been started and hold or adjust coumadin appropriately

## 2014-09-28 NOTE — Assessment & Plan Note (Signed)
Recheck urine in 2-3 weeks; start macrobid

## 2014-09-28 NOTE — Patient Instructions (Signed)
Start the new antibiotic Recheck INR in 5-6 days since the antibiotic may throw off your INR Stay hydrated Really limit saturated fats Keep appointment with Dr. Drema Dallas Let me know when you are ready to scan your thyroid

## 2014-09-28 NOTE — Assessment & Plan Note (Signed)
On anticoagulation; she has seen cardiologist and is going for stress testing and f/u visit soon; she knows reasons to call 911

## 2014-09-28 NOTE — Assessment & Plan Note (Addendum)
Established with Dr. Nehemiah Massed; having stress test on July 14th; on coumadin; on statin; call 911 if needed;  she declined offer to learn about vegetarian / vegan diets

## 2014-09-28 NOTE — Assessment & Plan Note (Signed)
Referral entered last week for pain clinic; will ask staff to check on that

## 2014-09-29 ENCOUNTER — Ambulatory Visit: Payer: Commercial Managed Care - HMO | Admitting: Family Medicine

## 2014-10-02 LAB — QUANTIFERON IN TUBE
QFT TB AG MINUS NIL VALUE: 0 [IU]/mL
QUANTIFERON MITOGEN VALUE: 7.57 IU/mL
QUANTIFERON NIL VALUE: 0.04 [IU]/mL
QUANTIFERON TB AG VALUE: 0.04 IU/mL
QUANTIFERON TB GOLD: NEGATIVE

## 2014-10-02 LAB — URINE CULTURE, REFLEX

## 2014-10-02 LAB — QUANTIFERON TB GOLD ASSAY (BLOOD)

## 2014-10-03 LAB — UA/M W/RFLX CULTURE, ROUTINE

## 2014-10-04 ENCOUNTER — Other Ambulatory Visit (INDEPENDENT_AMBULATORY_CARE_PROVIDER_SITE_OTHER): Payer: Commercial Managed Care - HMO

## 2014-10-04 ENCOUNTER — Other Ambulatory Visit: Payer: Self-pay

## 2014-10-04 VITALS — BP 175/81 | HR 78 | Temp 98.7°F | Wt 199.0 lb

## 2014-10-04 DIAGNOSIS — Z7901 Long term (current) use of anticoagulants: Secondary | ICD-10-CM | POA: Diagnosis not present

## 2014-10-04 LAB — COAGUCHEK XS/INR WAIVED
INR: 3.8 — ABNORMAL HIGH (ref 0.9–1.1)
PROTHROMBIN TIME: 45.3 s

## 2014-10-04 NOTE — Telephone Encounter (Signed)
It's too early to refill [place] Controlled Substance Reporting system shows she had 30 pills filled on September 13, 2014 That needs to last at least 30 days She won't be able to fill that again before July 15th (next Thursday)

## 2014-10-04 NOTE — Patient Instructions (Addendum)
Hold today's dose. Resume the same dose tomorrow and recheck in 1 week. Call your cardiologist about your Blood Pressure.

## 2014-10-05 NOTE — Telephone Encounter (Signed)
Left message to call.

## 2014-10-06 NOTE — Telephone Encounter (Signed)
Patient notified

## 2014-10-10 ENCOUNTER — Encounter: Payer: Self-pay | Admitting: Family Medicine

## 2014-10-10 ENCOUNTER — Ambulatory Visit (INDEPENDENT_AMBULATORY_CARE_PROVIDER_SITE_OTHER): Payer: Commercial Managed Care - HMO | Admitting: Family Medicine

## 2014-10-10 VITALS — BP 147/72 | HR 82 | Temp 97.8°F

## 2014-10-10 DIAGNOSIS — I482 Chronic atrial fibrillation, unspecified: Secondary | ICD-10-CM

## 2014-10-10 LAB — COAGUCHEK XS/INR WAIVED
INR: 2.6 — AB (ref 0.9–1.1)
Prothrombin Time: 31.3 s

## 2014-10-10 NOTE — Progress Notes (Signed)
   BP 147/72 mmHg  Pulse 82  Temp(Src) 97.8 F (36.6 C)  SpO2 98%  LMP  (LMP Unknown)   Subjective:    Patient ID: Daisy Watkins, female    DOB: 05/03/1932, 81 y.o.   MRN: [id number]  HPI: Daisy Watkins is a 81 y.o. female  Chief Complaint  Patient presents with  . Atrial Fibrillation    PT/INR recheck   Any bruising or bleeding? No Current dose of warfarin: Antibiotics are done Last INR was over 3 and she held it for a day  Relevant past medical, surgical, family and social history reviewed and updated as indicated. Interim medical history since our last visit reviewed. Allergies and medications reviewed and updated.  Review of Systems  Per HPI unless specifically indicated above     Objective:    BP 147/72 mmHg  Pulse 82  Temp(Src) 97.8 F (36.6 C)  SpO2 98%  LMP  (LMP Unknown)  Wt Readings from Last 3 Encounters:  10/04/14 199 lb (90.266 kg)  09/28/14 200 lb (90.719 kg)  09/21/14 199 lb 12.8 oz (90.629 kg)    Physical Exam  Results for orders placed or performed in visit on 10/04/14  CoaguChek XS/INR Waived  Result Value Ref Range   INR 3.8 (H) 0.9 - 1.1   Prothrombin Time 45.3 sec      Assessment & Plan:   Problem List Items Addressed This Visit      Cardiovascular and Mediastinum   Atrial fibrillation - Primary (Chronic)   Relevant Orders   INR/PT       Follow up plan: No Follow-up on file.  --------------------------- This appeared in March of 2017; appears to be incomplete note Signing off without addition of any information

## 2014-10-10 NOTE — Patient Instructions (Addendum)
Go back to the previous dose and regimen of warfarin that you were on prior to your antibiotics. Next INR in one month.

## 2014-10-11 ENCOUNTER — Other Ambulatory Visit: Payer: Commercial Managed Care - HMO

## 2014-10-12 DIAGNOSIS — E782 Mixed hyperlipidemia: Secondary | ICD-10-CM | POA: Diagnosis not present

## 2014-10-12 DIAGNOSIS — I10 Essential (primary) hypertension: Secondary | ICD-10-CM | POA: Diagnosis not present

## 2014-10-12 DIAGNOSIS — I4891 Unspecified atrial fibrillation: Secondary | ICD-10-CM | POA: Diagnosis not present

## 2014-10-12 DIAGNOSIS — I480 Paroxysmal atrial fibrillation: Secondary | ICD-10-CM | POA: Diagnosis not present

## 2014-10-13 MED ORDER — LORAZEPAM 1 MG PO TABS: 1.0000 mg | ORAL_TABLET | Freq: Every evening | ORAL | Status: DC | PRN

## 2014-10-13 NOTE — Telephone Encounter (Signed)
-----   Message from [name], MD sent at 10/04/2014  8:57 PM EDT ----- Regarding: due for lorazepam refill July 15th Daisy Watkins

## 2014-10-17 ENCOUNTER — Ambulatory Visit: Payer: Commercial Managed Care - HMO | Admitting: Pain Medicine

## 2014-10-24 ENCOUNTER — Other Ambulatory Visit: Payer: Self-pay | Admitting: Family Medicine

## 2014-11-09 ENCOUNTER — Ambulatory Visit: Payer: Commercial Managed Care - HMO | Admitting: Family Medicine

## 2014-11-23 ENCOUNTER — Encounter: Payer: Self-pay | Admitting: Family Medicine

## 2014-11-23 ENCOUNTER — Ambulatory Visit (INDEPENDENT_AMBULATORY_CARE_PROVIDER_SITE_OTHER): Payer: Commercial Managed Care - HMO | Admitting: Family Medicine

## 2014-11-23 VITALS — BP 129/73 | HR 60 | Temp 97.8°F | Ht 62.5 in | Wt 200.0 lb

## 2014-11-23 DIAGNOSIS — E1165 Type 2 diabetes mellitus with hyperglycemia: Secondary | ICD-10-CM

## 2014-11-23 DIAGNOSIS — I480 Paroxysmal atrial fibrillation: Secondary | ICD-10-CM | POA: Diagnosis not present

## 2014-11-23 DIAGNOSIS — Z7901 Long term (current) use of anticoagulants: Secondary | ICD-10-CM | POA: Diagnosis not present

## 2014-11-23 DIAGNOSIS — Z5181 Encounter for therapeutic drug level monitoring: Secondary | ICD-10-CM

## 2014-11-23 DIAGNOSIS — F411 Generalized anxiety disorder: Secondary | ICD-10-CM | POA: Insufficient documentation

## 2014-11-23 DIAGNOSIS — I10 Essential (primary) hypertension: Secondary | ICD-10-CM

## 2014-11-23 DIAGNOSIS — IMO0001 Reserved for inherently not codable concepts without codable children: Secondary | ICD-10-CM

## 2014-11-23 LAB — COAGUCHEK XS/INR WAIVED
INR: 3.3 — ABNORMAL HIGH (ref 0.9–1.1)
PROTHROMBIN TIME: 39.2 s

## 2014-11-23 NOTE — Assessment & Plan Note (Signed)
Last A1C in June; not time for recheck; will do diabetic visit in one month on or after Sept 23rd

## 2014-11-23 NOTE — Patient Instructions (Addendum)
Please do call the heart doctor about getting your sleep study scheduled I'll recommend that you start to work with a counselor / therapist about your anxiety and attacks Do not increase your lorazepam Return on or after September 23rd for your next visit and we'll do your fasting labs as well and check your diabetes and cholesterol, etc.  NEW WARFARIN INSTRUCTIONS: Thursdays: 7 mg Fridays: 7 mg Saturdays: 7 mg Sundays: 7.5 mg Mondays: 7 mg Tuesdays: 7 mg Wednesdays: 7.5 mg  7 mg = one 5 mg pill plus two of the 1 mg pills 7.5 mg = one and one-half of the 5 mg pills RECHECK INR in 2 weeks, lab only

## 2014-11-23 NOTE — Progress Notes (Signed)
BP 129/73 mmHg  Pulse 60  Temp(Src) 97.8 F (36.6 C)  Ht 5' 2.5" (1.588 m)  Wt 200 lb (90.719 kg)  BMI 35.97 kg/m2  SpO2 99%  LMP  (LMP Unknown)   Subjective:    Patient ID: Daisy Watkins, female    DOB: September 20, 1932, 81 y.o.   MRN: [id number]  HPI: Daisy Watkins is a 81 y.o. female  Chief Complaint  Patient presents with  . Hypertension  chief complaint: for a check Coumadin and high blood pressure No bruising or bleeding; she takes 1.5 pills daily of the 5 mg pills = 7.5 daily No recent antibiotics; eating a consistent amount of greens She went to the heart doctor 2-3 weeks; he did not do any blood work; he will see her back at the first of the year; he did a stress test and echo and cardiac catheterization He said everything was good she says Blood pressure at heart doctor was 142/82 in late June 2016; had 24 hour holter done; had paroxysmal atrial fibrillation; anticoagulation appropriate in his note High cholesterol; he increased her lipitor to 20 mg daily; will increase in the future if tolerated; no muscle aches or abd pain since increase He also ordered a sleep study; she hasn't taken that Suffering from anxiety attacks; used to take lorazepam BID; she just takes lorazepam at bedtime now Relevant past medical, surgical, family and social history reviewed and updated as indicated. Interim medical history since our last visit reviewed. Allergies and medications reviewed and updated.  Review of Systems  Per HPI unless specifically indicated above     Objective:    BP 129/73 mmHg  Pulse 60  Temp(Src) 97.8 F (36.6 C)  Ht 5' 2.5" (1.588 m)  Wt 200 lb (90.719 kg)  BMI 35.97 kg/m2  SpO2 99%  LMP  (LMP Unknown)  Wt Readings from Last 3 Encounters:  11/23/14 200 lb (90.719 kg)  10/04/14 199 lb (90.266 kg)  09/28/14 200 lb (90.719 kg)    Today's Vitals   11/23/14 1038 11/23/14 1116  BP: 176/73 129/73  Pulse: 81 60  Temp: 97.8 F (36.6 C)   Height: 5' 2.5" (1.588  m)   Weight: 200 lb (90.719 kg)   SpO2: 99%    Physical Exam  Constitutional: She appears well-developed and well-nourished. No distress.  Eyes: EOM are normal.  Neck: No thyromegaly present.  Cardiovascular: Normal rate.  An irregularly irregular rhythm present.  Pulmonary/Chest: Effort normal and breath sounds normal.  Abdominal: She exhibits no distension.  Musculoskeletal: She exhibits no edema.  Skin: No bruising, no ecchymosis and no purpura noted. No pallor.  Psychiatric:  Good eye contact with examiner   Results for orders placed or performed in visit on 11/23/14  CoaguChek XS/INR Waived (STAT)  Result Value Ref Range   INR 3.3 (H) 0.9 - 1.1   Prothrombin Time 39.2 sec  INR today is 3.3, PT is 39.2     Assessment & Plan:   Problem List Items Addressed This Visit      Cardiovascular and Mediastinum   Benign hypertension (Chronic)    Recheck BP today; BP was controlled at cardiologist less than 2 months ago; limit salt      Atrial fibrillation (Chronic)    afib-flutter; seeing cardiologist; continue anticoagulation with INR goal 2-3; INR checked today      Relevant Orders   CoaguChek XS/INR Waived (STAT) (Completed)     Other   Monitoring for anticoagulant use (Chronic)  Adjust does down just slightly by 4.8% and recheck in 2 weeks      Relevant Orders   CoaguChek XS/INR Waived (STAT) (Completed)   Diabetes mellitus type 2, uncontrolled, without complications (Chronic)    Last A1C in June; not time for recheck; will do diabetic visit in one month on or after Sept 23rd      Anxiety, generalized - Primary    I am not going to increase her lorazepam; recommended counseling, list of therapists given         Follow up plan: Return in about 2 weeks (around 12/07/2014) for INR check only; on or after Sept 23rd for diabetes and fasting labs.

## 2014-11-23 NOTE — Assessment & Plan Note (Signed)
afib-flutter; seeing cardiologist; continue anticoagulation with INR goal 2-3; INR checked today

## 2014-11-23 NOTE — Assessment & Plan Note (Signed)
I am not going to increase her lorazepam; recommended counseling, list of therapists given

## 2014-11-23 NOTE — Assessment & Plan Note (Signed)
Recheck BP today; BP was controlled at cardiologist less than 2 months ago; limit salt

## 2014-11-23 NOTE — Assessment & Plan Note (Signed)
Adjust does down just slightly by 4.8% and recheck in 2 weeks

## 2014-12-06 ENCOUNTER — Ambulatory Visit: Payer: Commercial Managed Care - HMO | Admitting: Family Medicine

## 2014-12-07 ENCOUNTER — Ambulatory Visit: Payer: Commercial Managed Care - HMO | Admitting: Family Medicine

## 2014-12-18 ENCOUNTER — Other Ambulatory Visit: Payer: Self-pay | Admitting: Family Medicine

## 2014-12-25 ENCOUNTER — Encounter: Payer: Self-pay | Admitting: Family Medicine

## 2014-12-25 ENCOUNTER — Ambulatory Visit (INDEPENDENT_AMBULATORY_CARE_PROVIDER_SITE_OTHER): Payer: Commercial Managed Care - HMO | Admitting: Family Medicine

## 2014-12-25 VITALS — BP 163/83 | HR 68 | Temp 97.5°F | Wt 204.0 lb

## 2014-12-25 DIAGNOSIS — L298 Other pruritus: Secondary | ICD-10-CM | POA: Diagnosis not present

## 2014-12-25 DIAGNOSIS — I482 Chronic atrial fibrillation, unspecified: Secondary | ICD-10-CM

## 2014-12-25 DIAGNOSIS — R8271 Bacteriuria: Secondary | ICD-10-CM

## 2014-12-25 DIAGNOSIS — N390 Urinary tract infection, site not specified: Secondary | ICD-10-CM

## 2014-12-25 DIAGNOSIS — I480 Paroxysmal atrial fibrillation: Secondary | ICD-10-CM | POA: Diagnosis not present

## 2014-12-25 DIAGNOSIS — Z7901 Long term (current) use of anticoagulants: Secondary | ICD-10-CM

## 2014-12-25 DIAGNOSIS — I82403 Acute embolism and thrombosis of unspecified deep veins of lower extremity, bilateral: Secondary | ICD-10-CM

## 2014-12-25 DIAGNOSIS — N898 Other specified noninflammatory disorders of vagina: Secondary | ICD-10-CM

## 2014-12-25 DIAGNOSIS — Z5181 Encounter for therapeutic drug level monitoring: Secondary | ICD-10-CM

## 2014-12-25 MED ORDER — APIXABAN 5 MG PO TABS: 5.0000 mg | ORAL_TABLET | Freq: Two times a day (BID) | ORAL | Status: DC

## 2014-12-25 MED ORDER — DOXYCYCLINE HYCLATE 100 MG PO TABS: 100.0000 mg | ORAL_TABLET | Freq: Two times a day (BID) | ORAL | Status: DC

## 2014-12-25 NOTE — Patient Instructions (Addendum)
Take cranberry juice daily to prevent infections We'll get the urine culture STOP warfarin altogether (Coumadin) We'll start a new blood thinner called Eliquis; take one pill twice a day You do not need to have your labs drawn regularly on this new blood thinner Seek medical care immediately if you experience any serious bleeding Return to see me for diabetes and other health issues in three weeks; please come fasting

## 2014-12-25 NOTE — Progress Notes (Signed)
BP 163/83 mmHg  Pulse 68  Temp(Src) 97.5 F (36.4 C)  Wt 204 lb (92.534 kg)  SpO2 100%  LMP  (LMP Unknown)   Subjective:    Patient ID: Daisy Watkins, female    DOB: 1932/09/07, 81 y.o.   MRN: [id number]  HPI: Daisy Watkins is a 81 y.o. female  Chief Complaint  Patient presents with  . Atrial Fibrillation    PT/INR check, she states she takes it how ever she feels like it. Sometimes a whole pill, sometimes a half pill.  . Urinary Tract Infection    She is having some itching in the vaginal area, wonders if she has a UTI   She is taking her coumadin "like you told me" she says; she does not write it down, just remembers; when I asked her how much exactly, she waffled some; she took 1 yesterday, then just 1/2 of a pill the day before that, afraid to overtake it she says She had it adjusted a month ago and was supposed to come back in 2 weeks later but did not; since she hasn't been in, she's been taking less than the last instructions so she wouldn't go high again; she is unable to tell me how much exactly she has taken in the last week Half of one one day, then one and a half some days and one whole pill some days She is not really sure how many pills she has taken in the past week She tells me she can remember, but then isn't sure how many pills she has taken in the past week; maybe 7 whole pills total in the last week; the strength is... And she couldn't remember that either She thinks she has been taking 7 mg pills I looked at her med list and she has two prescriptions, one for 5 mg pills and then one for 1 mg pills (to use to make finer adjustments) She thinks that she is going to come off of the Coumadin; she has not yet seen the doctor she has been telling me she is going to see now (for months) She wants to come off of the Coumadin, then says she doesn't She has had one DVT in the left leg; she has a filter she says The heart doctor told her to take an aspirin instead of Coumadin,  but I cannot find any documentation of that  I got into [facility] through [facility] and did some digging with the patient here in the office today Hx of IVC filter placed after bilateral LE DVTs June 17, 2005; rescanning of the legs showed bilateral partial obstruction June 25, 2006 Had taken coumadin but then stopped for 6 months and bilateral clots came back Hx of left pelvic retroperitoneal hematoma Per [facility] note October 06, 2007, she needs life-long anticoagulation  She thinks she has a urinary tract infection; no burning with urination; just itching; no discharge;   Relevant past medical, surgical, family and social history reviewed and updated as indicated. Interim medical history since our last visit reviewed. Allergies and medications reviewed and updated.  Review of Systems  HENT: Negative for nosebleeds.   Gastrointestinal: Negative for blood in stool.  Genitourinary: Negative for hematuria.  Hematological: Does not bruise/bleed easily.  Per HPI unless specifically indicated above     Objective:    BP 163/83 mmHg  Pulse 68  Temp(Src) 97.5 F (36.4 C)  Wt 204 lb (92.534 kg)  SpO2 100%  LMP  (LMP Unknown)  Wt Readings from Last 3 Encounters:  12/25/14 204 lb (92.534 kg)  11/23/14 200 lb (90.719 kg)  10/04/14 199 lb (90.266 kg)    Physical Exam  Constitutional: She appears well-developed and well-nourished.  HENT:  Head: Normocephalic and atraumatic.  Mouth/Throat: Mucous membranes are normal.  Eyes: EOM are normal. No scleral icterus.  Cardiovascular: Normal rate.  A regularly irregular rhythm present.  Very consistent, 3 regular beats, then early beat  Pulmonary/Chest: Effort normal and breath sounds normal.  Abdominal: She exhibits no distension.  obese  Neurological: She displays no tremor. Gait normal.  Skin: No bruising and no ecchymosis noted.  Psychiatric: Her speech is normal and behavior is normal. Judgment and thought content normal. Her mood appears  anxious (slightly anxious, but good eye contact with examiner). She exhibits abnormal recent memory (she demonstrates uncertainty and frustration about her warfarin dosing).   Lab Results  Component Value Date   INR 1.0 12/25/2014   INR 3.3* 11/23/2014   INR 2.6* 10/10/2014   Urine dip: positive nitrates; trace LE Urine micro: moderate bacteria     Assessment & Plan:   Problem List Items Addressed This Visit      Cardiovascular and Mediastinum   Paroxysmal atrial fibrillation - Primary    She has seen cardiologist; I did not see his documentation about stopping warfarin and using aspirin; continue f/u with cardiologist per his/her recommendations      Relevant Medications   apixaban (ELIQUIS) 5 MG TABS tablet   DVT, lower extremity, recurrent    Requiring lifelong anticoagulation per [facility] notes; reviewed with patient; will start Eliquis today in place of warfarin      Relevant Medications   apixaban (ELIQUIS) 5 MG TABS tablet     Other   Monitoring for anticoagulant use (Chronic)    Patient has not demonstrated safe use of warfarin; she has reduced her dose on her own over the last few weeks so she wouldn't overdo it (in her words); her INR today is 1.0, subtherapeutic; I explained how very important it is for me to know how much medicine she takes, the doses of the pills, how many pills she takes, etc. to be able adjust her warfarin; if she is under 2, she increases the risk that she will reclot and/or stroke; if she is over 3, she's more likely to bleed, though there are no guarantees even if INR between 2 and 3; I discussed having her see another doctor to manage her warfarin versus using one of the new Xa inhibitors; she was initially reluctant to use one of the newer medicines, but does not want to go elsewhere; she agrees to start the new medicine which will not need routine INR monitoring; risk of bleeding and stroke discussed      Bacteria in urine    Culture pending;  moderate bacteria under microscope; start antibiotic       Other Visit Diagnoses    Itching in the vaginal area        urine checked today, consistent with possible bladder infection; Rx sent to pharmacy; let me know if not resolved, would do pelvic, wet mount; patient agrees    Relevant Orders    UA/M w/rflx Culture, Routine       Follow up plan: Return in about 3 weeks (around 01/15/2015) for fasting labs and visit with Dr. Sanda Klein.  Medications Discontinued During This Encounter  Medication Reason  . warfarin (COUMADIN) 1 MG tablet Discontinued by Barton Want  .  warfarin (COUMADIN) 5 MG tablet Discontinued by Itzelle Gains    Meds ordered this encounter  Medications  . doxycycline (VIBRA-TABS) 100 MG tablet    Sig: Take 1 tablet (100 mg total) by mouth 2 (two) times daily.    Dispense:  14 tablet    Refill:  0  . apixaban (ELIQUIS) 5 MG TABS tablet    Sig: Take 1 tablet (5 mg total) by mouth 2 (two) times daily. This replaces warfarin (Coumadin); do NOT take both    Dispense:  60 tablet    Refill:  2    Orders Placed This Encounter  Procedures  . Microscopic Examination  . INR/PT  . UA/M w/rflx Culture, Routine  . UA/M w/rflx Culture, Routine   An after-visit summary was printed and given to the patient at [place].  Please see the patient instructions which may contain other information and recommendations beyond what is mentioned above in the assessment and plan.  Face-to-face time with patient was more than 25 minutes, >50% time spent counseling and coordination of care

## 2014-12-26 DIAGNOSIS — R8271 Bacteriuria: Secondary | ICD-10-CM | POA: Insufficient documentation

## 2014-12-26 DIAGNOSIS — I82409 Acute embolism and thrombosis of unspecified deep veins of unspecified lower extremity: Secondary | ICD-10-CM | POA: Insufficient documentation

## 2014-12-26 LAB — PROTIME-INR
INR: 1 (ref 0.8–1.2)
Prothrombin Time: 12.6 s — ABNORMAL HIGH (ref 9.1–12.0)

## 2014-12-26 LAB — MICROSCOPIC EXAMINATION: RENAL EPITHEL UA: NONE SEEN /HPF

## 2014-12-26 NOTE — Assessment & Plan Note (Signed)
Culture pending; moderate bacteria under microscope; start antibiotic

## 2014-12-26 NOTE — Assessment & Plan Note (Signed)
Requiring lifelong anticoagulation per [facility] notes; reviewed with patient; will start Eliquis today in place of warfarin

## 2014-12-26 NOTE — Assessment & Plan Note (Signed)
Patient has not demonstrated safe use of warfarin; she has reduced her dose on her own over the last few weeks so she wouldn't overdo it (in her words); her INR today is 1.0, subtherapeutic; I explained how very important it is for me to know how much medicine she takes, the doses of the pills, how many pills she takes, etc. to be able adjust her warfarin; if she is under 2, she increases the risk that she will reclot and/or stroke; if she is over 3, she's more likely to bleed, though there are no guarantees even if INR between 2 and 3; I discussed having her see another doctor to manage her warfarin versus using one of the new Xa inhibitors; she was initially reluctant to use one of the newer medicines, but does not want to go elsewhere; she agrees to start the new medicine which will not need routine INR monitoring; risk of bleeding and stroke discussed

## 2014-12-26 NOTE — Assessment & Plan Note (Signed)
She has seen cardiologist; I did not see his documentation about stopping warfarin and using aspirin; continue f/u with cardiologist per his/her recommendations

## 2014-12-27 ENCOUNTER — Telehealth: Payer: Self-pay | Admitting: Family Medicine

## 2014-12-27 NOTE — Telephone Encounter (Signed)
Person I am trying to reach is not accepting calls at this time

## 2014-12-29 LAB — URINE CULTURE, REFLEX

## 2015-01-01 LAB — UA/M W/RFLX CULTURE, ROUTINE

## 2015-01-05 ENCOUNTER — Other Ambulatory Visit: Payer: Self-pay | Admitting: Family Medicine

## 2015-01-05 NOTE — Telephone Encounter (Signed)
K+ and creatinine June 2016 reviewed; Rx approved

## 2015-01-10 ENCOUNTER — Other Ambulatory Visit: Payer: Self-pay | Admitting: Family Medicine

## 2015-01-10 NOTE — Telephone Encounter (Signed)
Routing to provider  

## 2015-01-11 NOTE — Telephone Encounter (Signed)
[place] site reviewed; no red flags; Rx approved

## 2015-01-15 ENCOUNTER — Ambulatory Visit (INDEPENDENT_AMBULATORY_CARE_PROVIDER_SITE_OTHER): Payer: Commercial Managed Care - HMO | Admitting: Family Medicine

## 2015-01-15 ENCOUNTER — Encounter: Payer: Self-pay | Admitting: Family Medicine

## 2015-01-15 VITALS — BP 174/81 | HR 82 | Temp 98.7°F | Wt 205.0 lb

## 2015-01-15 DIAGNOSIS — N898 Other specified noninflammatory disorders of vagina: Secondary | ICD-10-CM

## 2015-01-15 DIAGNOSIS — N760 Acute vaginitis: Secondary | ICD-10-CM

## 2015-01-15 DIAGNOSIS — R350 Frequency of micturition: Secondary | ICD-10-CM

## 2015-01-15 DIAGNOSIS — L298 Other pruritus: Secondary | ICD-10-CM | POA: Diagnosis not present

## 2015-01-15 DIAGNOSIS — L659 Nonscarring hair loss, unspecified: Secondary | ICD-10-CM | POA: Diagnosis not present

## 2015-01-15 DIAGNOSIS — A499 Bacterial infection, unspecified: Secondary | ICD-10-CM | POA: Diagnosis not present

## 2015-01-15 DIAGNOSIS — B9689 Other specified bacterial agents as the cause of diseases classified elsewhere: Secondary | ICD-10-CM

## 2015-01-15 LAB — WET PREP FOR TRICH, YEAST, CLUE
CLUE CELL EXAM: POSITIVE — AB
Trichomonas Exam: NEGATIVE
YEAST EXAM: NEGATIVE

## 2015-01-15 LAB — UA/M W/RFLX CULTURE, ROUTINE
Bilirubin, UA: NEGATIVE
Ketones, UA: NEGATIVE
Leukocytes, UA: NEGATIVE
NITRITE UA: NEGATIVE
Protein, UA: NEGATIVE
RBC, UA: NEGATIVE
Specific Gravity, UA: 1.01 (ref 1.005–1.030)
UUROB: 0.2 mg/dL (ref 0.2–1.0)
pH, UA: 5 (ref 5.0–7.5)

## 2015-01-15 MED ORDER — RIVAROXABAN 20 MG PO TABS: 20.0000 mg | ORAL_TABLET | Freq: Every day | ORAL | Status: DC

## 2015-01-15 MED ORDER — METRONIDAZOLE 0.75 % VA GEL: 1.0000 | Freq: Two times a day (BID) | VAGINAL | Status: DC

## 2015-01-15 NOTE — Assessment & Plan Note (Signed)
May be telogen effluvium or could be secondary to eliquis; use up eliquis and then start the xarelto

## 2015-01-15 NOTE — Progress Notes (Signed)
BP 174/81 mmHg  Pulse 82  Temp(Src) 98.7 F (37.1 C)  Wt 205 lb (92.987 kg)  SpO2 98%  LMP  (LMP Unknown)   Subjective:    Patient ID: Daisy Watkins, female    DOB: [birth year], 81 y.o.   MRN: [id number]  HPI: Daisy Watkins is a 81 y.o. female  Chief Complaint  Patient presents with  . Vaginal Itching  . urine frequency  . Ear itching   She is having itching in her privates; urine today showed trace glucose; intense; no discharge; sore down there; has not tried anything for her symptoms; not sexually active; no sitz baths, no bubble baths; has had this before with her diabetes, and once before when pregnant; no burning with urination; no blood in the urine; no fevers;   She is on the new medicine; she has noticed hair loss from the new medicine; she takes two a day; no bleeding from the medicine; hair loss is just on the scalp; started the week after starting the new medicine; having some scalp itching; constipated, asked about MOM; no bleeding; no easy bruising; she wants to switch to another medicine that she only has to take once a day  She keeps an eye on her blood pressure and says it just comes up when she comes to the doctor  Relevant past medical, surgical, family and social history reviewed and updated as indicated. Interim medical history since our last visit reviewed. Allergies and medications reviewed and updated.  Review of Systems  Per HPI unless specifically indicated above     Objective:    BP 174/81 mmHg  Pulse 82  Temp(Src) 98.7 F (37.1 C)  Wt 205 lb (92.987 kg)  SpO2 98%  LMP  (LMP Unknown)  Wt Readings from Last 3 Encounters:  01/15/15 205 lb (92.987 kg)  12/25/14 204 lb (92.534 kg)  11/23/14 200 lb (90.719 kg)    Physical Exam  Constitutional: She appears well-developed and well-nourished.  Cardiovascular: Normal rate.   Pulmonary/Chest: Effort normal and breath sounds normal.  Abdominal: Soft. Normal appearance and bowel sounds are normal.  She exhibits no distension. There is no tenderness. There is no CVA tenderness.  Genitourinary: There is no rash, lesion or injury on the right labia. There is no rash, lesion or injury on the left labia. No erythema or bleeding in the vagina. Vaginal discharge (thin watery discharge at introitus) found.   Results for orders placed or performed in visit on 12/25/14  Microscopic Examination  Result Value Ref Range   WBC, UA 0-5 0 -  5 /hpf   RBC, UA 0-2 0 -  2 /hpf   Epithelial Cells (non renal) 0-10 0 - 10 /hpf   Renal Epithel, UA None seen None seen /hpf   Bacteria, UA Moderate (A) None seen/Few  INR/PT  Result Value Ref Range   INR 1.0 0.8 - 1.2   Prothrombin Time 12.6 (H) 9.1 - 12.0 sec  UA/M w/rflx Culture, Routine  Result Value Ref Range   Urine Culture, Routine Final report (A)    Urine Culture result 1 Comment (A)    ANTIMICROBIAL SUSCEPTIBILITY Comment   Urine Culture, Routine  Result Value Ref Range   Urine Culture result 1 Gram negative rods (A)       Assessment & Plan:   Problem List Items Addressed This Visit      Musculoskeletal and Integument   Vaginal itching    Don't scratch; does not appear to  be atrophic vaginitis; may be secondary to BV      Relevant Orders   WET PREP FOR [place], YEAST, CLUE   Hair loss    May be telogen effluvium or could be secondary to eliquis; use up eliquis and then start the xarelto       Other Visit Diagnoses    Bacterial vaginosis    -  Primary    wet mount collected, treated with metronidazole internally    Urine frequency        Relevant Orders    UA/M w/rflx Culture, Routine (STAT)    WET PREP FOR [place], YEAST, CLUE       Follow up plan: Return if symptoms worsen or fail to improve.  An after-visit summary was printed and given to the patient at [place].  Please see the patient instructions which may contain other information and recommendations beyond what is mentioned above in the assessment and plan.  BP higher  than ideal; she will monitor at home A-fib, anticoagulation; use up eliquis and then start xarelto

## 2015-01-15 NOTE — Assessment & Plan Note (Signed)
Don't scratch; does not appear to be atrophic vaginitis; may be secondary to BV

## 2015-01-15 NOTE — Patient Instructions (Signed)
When you are finished with the Eliquis (blood thinner), start the new blood thinner called Xarelto Talk with your pharmacist about exactly when to start the new blood thinner Use the new medicine internally twice a day for one week Do monitor your blood pressure at home and call if over 150 on top Limit salt intake and follow the DASH guidelines

## 2015-02-19 ENCOUNTER — Other Ambulatory Visit: Payer: Self-pay | Admitting: Family Medicine

## 2015-02-19 NOTE — Telephone Encounter (Signed)
rx

## 2015-02-20 NOTE — Telephone Encounter (Signed)
rx

## 2015-03-21 ENCOUNTER — Encounter: Payer: Self-pay | Admitting: Family Medicine

## 2015-03-21 ENCOUNTER — Ambulatory Visit (INDEPENDENT_AMBULATORY_CARE_PROVIDER_SITE_OTHER): Payer: Commercial Managed Care - HMO | Admitting: Family Medicine

## 2015-03-21 VITALS — BP 183/70 | HR 76 | Temp 97.5°F | Wt 202.0 lb

## 2015-03-21 DIAGNOSIS — Z23 Encounter for immunization: Secondary | ICD-10-CM | POA: Diagnosis not present

## 2015-03-21 DIAGNOSIS — I480 Paroxysmal atrial fibrillation: Secondary | ICD-10-CM | POA: Diagnosis not present

## 2015-03-21 DIAGNOSIS — K760 Fatty (change of) liver, not elsewhere classified: Secondary | ICD-10-CM | POA: Diagnosis not present

## 2015-03-21 DIAGNOSIS — E1165 Type 2 diabetes mellitus with hyperglycemia: Secondary | ICD-10-CM

## 2015-03-21 DIAGNOSIS — Z7901 Long term (current) use of anticoagulants: Secondary | ICD-10-CM | POA: Diagnosis not present

## 2015-03-21 DIAGNOSIS — R8271 Bacteriuria: Secondary | ICD-10-CM

## 2015-03-21 DIAGNOSIS — I10 Essential (primary) hypertension: Secondary | ICD-10-CM | POA: Diagnosis not present

## 2015-03-21 DIAGNOSIS — IMO0001 Reserved for inherently not codable concepts without codable children: Secondary | ICD-10-CM

## 2015-03-21 DIAGNOSIS — I700 Atherosclerosis of aorta: Secondary | ICD-10-CM

## 2015-03-21 DIAGNOSIS — L659 Nonscarring hair loss, unspecified: Secondary | ICD-10-CM | POA: Diagnosis not present

## 2015-03-21 DIAGNOSIS — E785 Hyperlipidemia, unspecified: Secondary | ICD-10-CM | POA: Diagnosis not present

## 2015-03-21 DIAGNOSIS — Z5181 Encounter for therapeutic drug level monitoring: Secondary | ICD-10-CM

## 2015-03-21 LAB — CBC WITH DIFFERENTIAL/PLATELET
HEMATOCRIT: 39.4 % (ref 34.0–46.6)
HEMOGLOBIN: 13.8 g/dL (ref 11.1–15.9)
Lymphocytes Absolute: 1.5 10*3/uL (ref 0.7–3.1)
Lymphs: 35 %
MCH: 30.4 pg (ref 26.6–33.0)
MCHC: 35 g/dL (ref 31.5–35.7)
MCV: 87 fL (ref 79–97)
MID (ABSOLUTE): 0.3 10*3/uL (ref 0.1–1.6)
MID: 8 %
Neutrophils Absolute: 2.4 10*3/uL (ref 1.4–7.0)
Neutrophils: 57 %
PLATELETS: 213 10*3/uL (ref 150–379)
RBC: 4.54 x10E6/uL (ref 3.77–5.28)
RDW: 13.7 % (ref 12.3–15.4)
WBC: 4.2 10*3/uL (ref 3.4–10.8)

## 2015-03-21 LAB — COAGUCHEK XS/INR WAIVED
INR: 1.9 — AB (ref 0.9–1.1)
PROTHROMBIN TIME: 23.4 s

## 2015-03-21 LAB — MICROALBUMIN, URINE WAIVED
Creatinine, Urine Waived: 100 mg/dL (ref 10–300)
Microalb, Ur Waived: 10 mg/L (ref 0–19)

## 2015-03-21 LAB — MICROSCOPIC EXAMINATION

## 2015-03-21 LAB — LIPID PANEL PICCOLO, WAIVED
Chol/HDL Ratio Piccolo,Waive: 4.7 mg/dL
Cholesterol Piccolo, Waived: 233 mg/dL — ABNORMAL HIGH (ref <200)
HDL CHOL PICCOLO, WAIVED: 50 mg/dL — AB (ref >59)
LDL CHOL CALC PICCOLO WAIVED: 117 mg/dL — AB (ref <100)
Triglycerides Piccolo,Waived: 328 mg/dL — ABNORMAL HIGH (ref <150)
VLDL CHOL CALC PICCOLO,WAIVE: 66 mg/dL — AB (ref <30)

## 2015-03-21 LAB — BAYER DCA HB A1C WAIVED: HB A1C (BAYER DCA - WAIVED): 7.3 % — ABNORMAL HIGH (ref <7.0)

## 2015-03-21 MED ORDER — WARFARIN SODIUM 6 MG PO TABS: 6.0000 mg | ORAL_TABLET | Freq: Every day | ORAL | Status: DC

## 2015-03-21 MED ORDER — AMLODIPINE BESYLATE 10 MG PO TABS: 10.0000 mg | ORAL_TABLET | Freq: Every day | ORAL | Status: DC

## 2015-03-21 MED ORDER — METFORMIN HCL 500 MG PO TABS: 500.0000 mg | ORAL_TABLET | Freq: Three times a day (TID) | ORAL | Status: DC

## 2015-03-21 NOTE — Patient Instructions (Addendum)
You received the flu shot today; it should protect you against the flu virus over the coming months; it will take about two weeks for antibodies to develop; do try to stay away from hospitals, nursing homes, and daycares during peak flu season; taking extra vitamin C daily during flu season may help you avoid getting sick Take warfarin 6 mg daily, return in two weeks for recheck We'll contact you with the other labs Increase your metformin to three times a day Decrease your insulin in half as we make this transition off of insulin completely per your wishes

## 2015-03-21 NOTE — Assessment & Plan Note (Addendum)
Check A1c today; goal under 7 ideally, but would settle for less than 7.5 to 8 for her, given her age and comorbidities; she wishes to stop the insulin, but I do not want to stop that cold [place]; she adjusted her own blood thinner, and I really don't want her to just stop her insulin on her own; will have her decrease it and check FSBS 3x a day and bring readings in to appt in two weeks; foot exam by MD today; patient to check feet night; patient encouraged to see eye doctor yearly

## 2015-03-21 NOTE — Assessment & Plan Note (Signed)
Check lipids today; back on warfarin; no chest pain; seeing cardiologist, goes to see him January 2017

## 2015-03-21 NOTE — Progress Notes (Signed)
BP 183/70 mmHg  Pulse 76  Temp(Src) 97.5 F (36.4 C)  Wt 202 lb (91.627 kg)  SpO2 97%  LMP  (LMP Unknown)   Subjective:    Patient ID: Daisy Watkins, female    DOB: 04/17/1932, 81 y.o.   MRN: [id number]  HPI: Daisy Watkins is a 81 y.o. female  Chief Complaint  Patient presents with  . Diabetes    She can not afford the Humulin, wants to be changed to something cheaper. She will call and schedule an eye exam after the 1st of the year.  . Hypertension  . Hyperlipidemia  . Atrial Fibrillation    She could not afford to Xarelto, so she stopped it and started back taking the Coumadin 5mg  on her own.   She cannot afford the Xarelto; she asked the druggist what she could do and so she started back on warfarin 5 mg daily two weeks ago; I asked more about the transition; she started the warfarin about a week after stopping the Xarelto; no bad bleeding, just a cost issue; she has atrial fibrillation and has a cardiologist; it does not sound like this switch between anticoagulants was discussed with him, either  She cannot afford the insulin, and wants to do the pill instead; she wants to come off of insulin completely  She has been off of the cholesterol pill; off for 2-3 months; she tries to watch a pretty good diet  Relevant past medical, surgical, family and social history reviewed and updated as indicated. Interim medical history since our last visit reviewed. Allergies and medications reviewed and updated.  Review of Systems  Respiratory: Negative for shortness of breath.   Cardiovascular: Negative for chest pain.  Neurological: Negative for numbness.  Per HPI unless specifically indicated above     Objective:    BP 183/70 mmHg  Pulse 76  Temp(Src) 97.5 F (36.4 C)  Wt 202 lb (91.627 kg)  SpO2 97%  LMP  (LMP Unknown)  Wt Readings from Last 3 Encounters:  03/21/15 202 lb (91.627 kg)  01/15/15 205 lb (92.987 kg)  12/25/14 204 lb (92.534 kg)    Physical Exam   Constitutional: She appears well-developed and well-nourished. No distress.  HENT:  Head: Normocephalic and atraumatic.  Eyes: EOM are normal. No scleral icterus.  Neck: No thyromegaly present.  Cardiovascular: Normal rate and normal heart sounds.  An irregularly irregular rhythm present.  No murmur heard. Pulmonary/Chest: Effort normal and breath sounds normal. No respiratory distress. She has no wheezes.  Abdominal: Soft. Bowel sounds are normal. She exhibits no distension.  Musculoskeletal: Normal range of motion. She exhibits no edema.  Neurological: She is alert. She exhibits normal muscle tone.  Skin: Skin is warm and dry. She is not diaphoretic. No pallor.  Psychiatric: She has a normal mood and affect. Her behavior is normal. Judgment and thought content normal.   Diabetic Foot Form - Detailed   Diabetic Foot Exam - detailed  Diabetic Foot exam was performed with the following findings:  Yes 03/21/2015  4:42 PM  Visual Foot Exam completed.:  Yes  Are the shoes appropriate in style and fit?:  Yes  Are the toenails long?:  No  Are the toenails thick?:  No  Are the toenails ingrown?:  No  Normal Range of Motion:  Yes    Pulse Foot Exam completed.:  Yes  Right Dorsalis Pedis:  Present Left Dorsalis Pedis:  Present  Sensory Foot Exam Completed.:  Yes  Semmes-Weinstein Monofilament  Test  R Site 1-Great Toe:  Pos L Site 1-Great Toe:  Pos  R Site 4:  Pos L Site 4:  Pos  R Site 5:  Pos L Site 5:  Pos          Assessment & Plan:   Problem List Items Addressed This Visit      Cardiovascular and Mediastinum   Aortic atherosclerosis (HCC) (Chronic)    Noted on chest CT June 2016; goal LDL less than 70; check fasting labs      Relevant Medications   warfarin (COUMADIN) 6 MG tablet   amLODipine (NORVASC) 10 MG tablet   Benign hypertension (Chronic)    Increase amlodipine from 5 mg daily to 10 mg daily; DASH guidelines; weight loss; recheck in 2 weeks      Relevant  Medications   warfarin (COUMADIN) 6 MG tablet   amLODipine (NORVASC) 10 MG tablet   Paroxysmal atrial fibrillation (HCC)    On warfarin (she took herself off of Xarelto without physician involvement); cautioned her strongly about doing this in the future, to always involve her provider(s) because of risks; keep f/u with Dr. Nehemiah Massed      Relevant Medications   warfarin (COUMADIN) 6 MG tablet   amLODipine (NORVASC) 10 MG tablet     Digestive   Fatty liver (Chronic)    Check liver enzymes today; modest weight loss and healthier eating would help      Relevant Orders   Comprehensive metabolic panel (Completed)     Musculoskeletal and Integument   Hair loss    Recheck TSH      Relevant Orders   TSH (Completed)     Other   Monitoring for anticoagulant use (Chronic)    Check PT/INR in-house today; 1.9 discussed with patient; I would like to simplify her reigmen as much as possible; we had her using 5 mg pills earlier and taking 7.5 and 5 mg, but I'd like to see if we could have her just take 6 mg every day and make there be less room for adjustment and variability; return in 2 weeks for recheck INR      Relevant Orders   CoaguChek XS/INR Waived (Completed)   Diabetes mellitus type 2, uncontrolled, without complications (HCC) - Primary (Chronic)    Check A1c today; goal under 7 ideally, but would settle for less than 7.5 to 8 for her, given her age and comorbidities; she wishes to stop the insulin, but I do not want to stop that cold [place]; she adjusted her own blood thinner, and I really don't want her to just stop her insulin on her own; will have her decrease it and check FSBS 3x a day and bring readings in to appt in two weeks; foot exam by MD today; patient to check feet night; patient encouraged to see eye doctor yearly      Relevant Medications   metFORMIN (GLUCOPHAGE) 500 MG tablet   Other Relevant Orders   Bayer DCA Hb A1c Waived (Completed)   Microalbumin, Urine Waived  (Completed)   Hyperlipidemia (Chronic)    Today's lipid panel is all on her own, not taking any statin for 2-3 months; I would love to get her back on the statin, obviously, but we'll see what it is today; goal LDL less than 70; goal HDL over 50; goal TG less than 150, and goal total less than 200; decrease saturated fats and work on modest weight loss      Relevant Medications  warfarin (COUMADIN) 6 MG tablet   amLODipine (NORVASC) 10 MG tablet   Other Relevant Orders   Lipid Panel [place], Waived (Completed)   Bacteria in urine    Check urine today; gets a few infections each year      Relevant Orders   UA/M w/rflx Culture, Routine (Completed)   Medication monitoring encounter   Relevant Orders   CBC With Differential/Platelet (Completed)   Comprehensive metabolic panel (Completed)    Other Visit Diagnoses    Needs flu shot           Follow up plan: Return in about 2 weeks (around 04/04/2015) for 30 minutes, INR check, diabetes.  An after-visit summary was printed and given to the patient at [place].  Please see the patient instructions which may contain other information and recommendations beyond what is mentioned above in the assessment and plan. Meds ordered this encounter  Medications  . DISCONTD: warfarin (COUMADIN) 5 MG tablet    Sig: Take 5 mg by mouth.  . warfarin (COUMADIN) 6 MG tablet    Sig: Take 1 tablet (6 mg total) by mouth daily.    Dispense:  30 tablet    Refill:  1    Pharmacist -- we're going to 6 mg daily  . amLODipine (NORVASC) 10 MG tablet    Sig: Take 1 tablet (10 mg total) by mouth daily.    Dispense:  30 tablet    Refill:  3    Pharmacist -- I'm increasing the dose  . metFORMIN (GLUCOPHAGE) 500 MG tablet    Sig: Take 1 tablet (500 mg total) by mouth 3 (three) times daily. Before meals    Dispense:  90 tablet    Refill:  3    New instructions   Medications Discontinued During This Encounter  Medication Reason  . atorvastatin (LIPITOR) 40  MG tablet Patient Preference  . doxycycline (VIBRA-TABS) 100 MG tablet Completed Course  . metroNIDAZOLE (METROGEL) 0.75 % vaginal gel Completed Course  . rivaroxaban (XARELTO) 20 MG TABS tablet Cost of medication  . warfarin (COUMADIN) 5 MG tablet Dose change  . Insulin Syringe-Needle U-100 (INSULIN SYRINGE 1CC/31GX5/16") 31G X 5/16" 1 ML MISC Cost of medication  . HUMULIN 70/30 (70-30) 100 UNIT/ML injection Patient Preference  . B-D INS SYRINGE 0.5CC/31GX5/16 31G X 5/16" 0.5 ML MISC Patient Preference  . amLODipine (NORVASC) 5 MG tablet Dose change  . metFORMIN (GLUCOPHAGE) 500 MG tablet Dose change   Face-to-face time with patient was more than 40 minutes, >50% time spent counseling and coordination of care

## 2015-03-21 NOTE — Assessment & Plan Note (Signed)
Check urine today; gets a few infections each year

## 2015-03-21 NOTE — Assessment & Plan Note (Addendum)
Today's lipid panel is all on her own, not taking any statin for 2-3 months; I would love to get her back on the statin, obviously, but we'll see what it is today; goal LDL less than 70; goal HDL over 50; goal TG less than 150, and goal total less than 200; decrease saturated fats and work on modest weight loss

## 2015-03-21 NOTE — Assessment & Plan Note (Signed)
Recheck TSH 

## 2015-03-21 NOTE — Assessment & Plan Note (Addendum)
Check PT/INR in-house today; 1.9 discussed with patient; I would like to simplify her reigmen as much as possible; we had her using 5 mg pills earlier and taking 7.5 and 5 mg, but I'd like to see if we could have her just take 6 mg every day and make there be less room for adjustment and variability; return in 2 weeks for recheck INR

## 2015-03-21 NOTE — Assessment & Plan Note (Addendum)
Check liver enzymes today; modest weight loss and healthier eating would help

## 2015-03-22 LAB — COMPREHENSIVE METABOLIC PANEL
A/G RATIO: 1.6 (ref 1.1–2.5)
ALK PHOS: 113 IU/L (ref 39–117)
ALT: 19 IU/L (ref 0–32)
AST: 14 IU/L (ref 0–40)
Albumin: 3.8 g/dL (ref 3.5–4.7)
BILIRUBIN TOTAL: 0.5 mg/dL (ref 0.0–1.2)
BUN/Creatinine Ratio: 14 (ref 11–26)
BUN: 10 mg/dL (ref 8–27)
CHLORIDE: 101 mmol/L (ref 96–106)
CO2: 25 mmol/L (ref 18–29)
Calcium: 9.6 mg/dL (ref 8.7–10.3)
Creatinine, Ser: 0.74 mg/dL (ref 0.57–1.00)
GFR calc Af Amer: 88 mL/min/{1.73_m2} (ref >59)
GFR calc non Af Amer: 77 mL/min/{1.73_m2} (ref >59)
Globulin, Total: 2.4 g/dL (ref 1.5–4.5)
Glucose: 232 mg/dL — ABNORMAL HIGH (ref 65–99)
POTASSIUM: 4.8 mmol/L (ref 3.5–5.2)
Sodium: 141 mmol/L (ref 134–144)
Total Protein: 6.2 g/dL (ref 6.0–8.5)

## 2015-03-22 LAB — TSH: TSH: 0.684 u[IU]/mL (ref 0.450–4.500)

## 2015-03-23 LAB — UA/M W/RFLX CULTURE, ROUTINE: Organism ID, Bacteria: NO GROWTH

## 2015-03-26 NOTE — Assessment & Plan Note (Signed)
Increase amlodipine from 5 mg daily to 10 mg daily; DASH guidelines; weight loss; recheck in 2 weeks

## 2015-03-26 NOTE — Assessment & Plan Note (Signed)
Noted on chest CT June 2016; goal LDL less than 70; check fasting labs

## 2015-03-26 NOTE — Assessment & Plan Note (Signed)
On warfarin (she took herself off of Xarelto without physician involvement); cautioned her strongly about doing this in the future, to always involve her provider(s) because of risks; keep f/u with Dr. Nehemiah Massed

## 2015-04-04 ENCOUNTER — Other Ambulatory Visit: Payer: Self-pay | Admitting: Family Medicine

## 2015-04-04 NOTE — Telephone Encounter (Signed)
Last Cr and K+ reviewed; Rx approved 

## 2015-04-05 ENCOUNTER — Ambulatory Visit: Payer: Commercial Managed Care - HMO | Admitting: Family Medicine

## 2015-04-09 ENCOUNTER — Other Ambulatory Visit: Payer: Self-pay | Admitting: Family Medicine

## 2015-04-10 NOTE — Telephone Encounter (Signed)
rx approved

## 2015-04-11 ENCOUNTER — Other Ambulatory Visit: Payer: Self-pay | Admitting: Family Medicine

## 2015-04-11 NOTE — Telephone Encounter (Signed)
I just approved 3 month supply yesterday; please resolve with pharmacy; thank you

## 2015-04-12 NOTE — Telephone Encounter (Signed)
This was a duplicate refill request. Pharmacy states that they got the rx this morning.

## 2015-04-16 ENCOUNTER — Ambulatory Visit (INDEPENDENT_AMBULATORY_CARE_PROVIDER_SITE_OTHER): Payer: Commercial Managed Care - HMO | Admitting: Family Medicine

## 2015-04-16 ENCOUNTER — Other Ambulatory Visit: Payer: Self-pay | Admitting: Family Medicine

## 2015-04-16 ENCOUNTER — Encounter: Payer: Self-pay | Admitting: Family Medicine

## 2015-04-16 VITALS — BP 170/78 | HR 87 | Temp 97.3°F | Ht 63.0 in | Wt 195.0 lb

## 2015-04-16 DIAGNOSIS — IMO0001 Reserved for inherently not codable concepts without codable children: Secondary | ICD-10-CM

## 2015-04-16 DIAGNOSIS — I480 Paroxysmal atrial fibrillation: Secondary | ICD-10-CM

## 2015-04-16 DIAGNOSIS — Z7901 Long term (current) use of anticoagulants: Secondary | ICD-10-CM

## 2015-04-16 DIAGNOSIS — I10 Essential (primary) hypertension: Secondary | ICD-10-CM | POA: Diagnosis not present

## 2015-04-16 DIAGNOSIS — L02212 Cutaneous abscess of back [any part, except buttock]: Secondary | ICD-10-CM | POA: Insufficient documentation

## 2015-04-16 DIAGNOSIS — E1165 Type 2 diabetes mellitus with hyperglycemia: Secondary | ICD-10-CM

## 2015-04-16 DIAGNOSIS — Z794 Long term (current) use of insulin: Secondary | ICD-10-CM | POA: Diagnosis not present

## 2015-04-16 DIAGNOSIS — Z5181 Encounter for therapeutic drug level monitoring: Secondary | ICD-10-CM

## 2015-04-16 MED ORDER — CEPHALEXIN 500 MG PO CAPS: 500.0000 mg | ORAL_CAPSULE | Freq: Four times a day (QID) | ORAL | Status: DC

## 2015-04-16 MED ORDER — WARFARIN SODIUM 1 MG PO TABS: ORAL_TABLET | ORAL | Status: DC

## 2015-04-16 MED ORDER — INSULIN NPH ISOPHANE & REGULAR (70-30) 100 UNIT/ML ~~LOC~~ SUSP: SUBCUTANEOUS | Status: DC

## 2015-04-16 MED ORDER — AMLODIPINE BESYLATE 5 MG PO TABS: 7.5000 mg | ORAL_TABLET | Freq: Every day | ORAL | Status: DC

## 2015-04-16 NOTE — Patient Instructions (Addendum)
Take 7.5 mg of the warfarin tonight (1 and 1/2 pills of the 5 mg strength) Start tomorrow, take 6 mg on Tuesdays, Wednesdays, Thursdays, Saturdays, and Sundays, and 7 mg on Fridays and Mondays Start the new antibiotics Please do eat yogurt daily or take a probiotic daily for the next month or two We want to replace the healthy germs in the gut If you notice foul, watery diarrhea in the next two months, schedule an appointment RIGHT AWAY Start back on the insulin, just at twenty units before your morning meal and ten units before your evening meal Continue the metformin Let me know when you are ready to see the surgeon Return in 2-1/2 weeks If you have any easy bruising or bleeding, please come in sooner for an INR check

## 2015-04-16 NOTE — Progress Notes (Signed)
BP 170/78 mmHg  Pulse 87  Temp(Src) 97.3 F (36.3 C)  Ht 5\' 3"  (1.6 m)  Wt 195 lb (88.451 kg)  BMI 34.55 kg/m2  SpO2 99%  LMP  (LMP Unknown)   Subjective:    Patient ID: Daisy Watkins, female    DOB: 01/22/1933, 82 y.o.   MRN: [id number]  HPI: Daisy Watkins is a 82 y.o. female  Chief Complaint  Patient presents with  . Hypertension    We increased her Amlodipine at her last appointment, but she said she thinks it is too much and she started feeling "blahh" on it so she stopped it.  . Atrial Fibrillation    INR Recheck  . Diabetes    She hasn't been checking her sugars but she doesn't think the Metformin is keeping her sugars under control. She's afraid she needs to go back on insulin  . Boil    She has a boil on her back she'd like you to look at. It came back up last week.   She needs to have her INR checked; she is on life-long anticoagulation for atrial fibrillation; she had been on previously for recurrent DVT, but now has a-fib as well; she has seen cardiologist  Her blood pressure is elevated; at the last visit, her amlodipine was increased from 5mg  to 10 mg daily; however, she says she felt "blah" and she stopped it completely; she is not taking any CCB now  Her last A1c was 7.3; she wanted to stop insulin at last visit, so we were decreasing the dose and taking her off of that at her request; now she thinks she needs to go back on it; she isn't checking her sugars at all; finished the insulin one week ago; feels like that it is high; having dry mouth; willing to go back on; she is urinating a lot at night;   She has a boil on her back ; she had an I&D of a similar lesion a few years ago; no fevers; draining   She has lost weight, 10 pounds over the last 3 months; her last TSH on March 21, 2015 was normal at 0.685; urinating a lot at night and off of insulin now   Relevant past medical, surgical, family and social history reviewed and updated as indicated. Interim  medical history since our last visit reviewed. Allergies and medications reviewed and updated.  Review of Systems  Per HPI unless specifically indicated above     Objective:    BP 170/78 mmHg  Pulse 87  Temp(Src) 97.3 F (36.3 C)  Ht 5\' 3"  (1.6 m)  Wt 195 lb (88.451 kg)  BMI 34.55 kg/m2  SpO2 99%  LMP  (LMP Unknown)  Wt Readings from Last 3 Encounters:  05/03/15 199 lb (90.266 kg)  04/16/15 195 lb (88.451 kg)  03/21/15 202 lb (91.627 kg)    Physical Exam  Constitutional: She appears well-developed and well-nourished.  Weight down 10 pounds over last 3 months  Eyes: EOM are normal. No scleral icterus.  Neck: No JVD present.  Cardiovascular: Normal rate.   Pulmonary/Chest: Effort normal and breath sounds normal.  Abdominal: Soft.  Musculoskeletal: Normal range of motion. She exhibits no edema.  Neurological: She is alert.  Skin: Skin is warm. Lesion (upper back, draining sebaceous cyst; foul odor) noted.  Psychiatric: She has a normal mood and affect.   INR 1.9     Assessment & Plan:   Problem List Items Addressed This Visit  Cardiovascular and Mediastinum   Benign hypertension (Chronic)    Not to goal; patient noncompliant with her medicines; URGED her to get back on medicine to bring her BP down; try to follow DASH guidleines      Relevant Medications   warfarin (COUMADIN) 1 MG tablet   Paroxysmal atrial fibrillation (HCC)    Continue anti-coagulation; goal INR 2 to 3      Relevant Medications   warfarin (COUMADIN) 1 MG tablet   Other Relevant Orders   CoaguChek XS/INR Waived (STAT) (Completed)     Other   Monitoring for anticoagulant use (Chronic)    Goal INR 2-3, recurrent DVTs and paroxysmal atrial fibrillation; adjust dose slightly, close f/u; see AVS      Diabetes mellitus type 2, uncontrolled, without complications (HCC) (Chronic)    Patient had wanted to stop insulin; adjust meds now at her request      Abscess of back - Primary    Same  spot as a few years ago; will start antibiotic just for a few days while awaiting culture results; cautioned about C diff, see AVS      Relevant Orders   Wound culture (Completed)    Other Visit Diagnoses    Long term (current) use of anticoagulants        Relevant Orders    CoaguChek XS/INR Waived (STAT) (Completed)       Follow up plan: Return in about 17 days (around 05/03/2015).  An after-visit summary was printed and given to the patient at [place].  Please see the patient instructions which may contain other information and recommendations beyond what is mentioned above in the assessment and plan.  Face-to-face time with patient was more than 25 minutes, >50% time spent counseling and coordination of care

## 2015-04-17 LAB — COAGUCHEK XS/INR WAIVED
INR: 1.9 — ABNORMAL HIGH (ref 0.9–1.1)
PROTHROMBIN TIME: 23.2 s

## 2015-04-19 ENCOUNTER — Telehealth: Payer: Self-pay

## 2015-04-19 LAB — WOUND CULTURE

## 2015-04-19 NOTE — Telephone Encounter (Signed)
-----   Message from [name], MD sent at 04/19/2015 11:51 AM EST ----- Please instruct patient to STOP the antibiotics; continue to keep area on her back clean and dry and covered

## 2015-04-19 NOTE — Telephone Encounter (Signed)
Called patient and no answer. Left message for patient to return our phone call.

## 2015-04-20 NOTE — Telephone Encounter (Signed)
Patient notified

## 2015-04-20 NOTE — Telephone Encounter (Signed)
Please call pt, she will be available by phone today.

## 2015-05-03 ENCOUNTER — Ambulatory Visit (INDEPENDENT_AMBULATORY_CARE_PROVIDER_SITE_OTHER): Payer: Commercial Managed Care - HMO | Admitting: Family Medicine

## 2015-05-03 ENCOUNTER — Encounter: Payer: Self-pay | Admitting: Family Medicine

## 2015-05-03 VITALS — BP 169/73 | HR 83 | Temp 97.3°F | Wt 199.0 lb

## 2015-05-03 DIAGNOSIS — E1165 Type 2 diabetes mellitus with hyperglycemia: Secondary | ICD-10-CM

## 2015-05-03 DIAGNOSIS — I10 Essential (primary) hypertension: Secondary | ICD-10-CM | POA: Diagnosis not present

## 2015-05-03 DIAGNOSIS — I480 Paroxysmal atrial fibrillation: Secondary | ICD-10-CM | POA: Diagnosis not present

## 2015-05-03 DIAGNOSIS — Z5181 Encounter for therapeutic drug level monitoring: Secondary | ICD-10-CM

## 2015-05-03 DIAGNOSIS — Z7901 Long term (current) use of anticoagulants: Secondary | ICD-10-CM | POA: Diagnosis not present

## 2015-05-03 DIAGNOSIS — E785 Hyperlipidemia, unspecified: Secondary | ICD-10-CM

## 2015-05-03 DIAGNOSIS — L02212 Cutaneous abscess of back [any part, except buttock]: Secondary | ICD-10-CM

## 2015-05-03 DIAGNOSIS — Z794 Long term (current) use of insulin: Secondary | ICD-10-CM

## 2015-05-03 DIAGNOSIS — IMO0001 Reserved for inherently not codable concepts without codable children: Secondary | ICD-10-CM

## 2015-05-03 LAB — COAGUCHEK XS/INR WAIVED
INR: 1.9 — ABNORMAL HIGH (ref 0.9–1.1)
Prothrombin Time: 22.5 s

## 2015-05-03 MED ORDER — ROSUVASTATIN CALCIUM 10 MG PO TABS: 10.0000 mg | ORAL_TABLET | Freq: Every day | ORAL | Status: DC

## 2015-05-03 MED ORDER — AMLODIPINE BESYLATE 10 MG PO TABS: 10.0000 mg | ORAL_TABLET | Freq: Every day | ORAL | Status: DC

## 2015-05-03 MED ORDER — INSULIN NPH ISOPHANE & REGULAR (70-30) 100 UNIT/ML ~~LOC~~ SUSP: SUBCUTANEOUS | Status: DC

## 2015-05-03 NOTE — Assessment & Plan Note (Signed)
Refer back to surgeon for evaluation and possible excision of area on upper back

## 2015-05-03 NOTE — Assessment & Plan Note (Signed)
Check INR 

## 2015-05-03 NOTE — Progress Notes (Signed)
BP 169/73 mmHg  Pulse 83  Temp(Src) 97.3 F (36.3 C)  Wt 199 lb (90.266 kg)  SpO2 96%  LMP  (LMP Unknown)   Subjective:    Patient ID: Daisy Watkins, female    DOB: [birth year], 82 y.o.   MRN: [id number]  HPI: Daisy Watkins is a 82 y.o. female  Chief Complaint  Patient presents with  . Diabetes    She states the Metformin is not doing any good, her sugars are still between 200-300. Makes her feel "not right" and soreness all over her body. She thinks she needs to stop it.  . Atrial Fibrillation    PT/INR check. Changed to 6mg  on Tu,Wed,Th,Sat, Sun. 7mg  on Mon, Fri  . URI    She states she still has sinus congestion and cough.   She asked to go off of insulin at prior visit, so she was taken off, then her sugars climbed; she was started back on insulin, but she says that her sugars are still high Last A1c was 7.3 on 03/21/15 Sugars range in the 200s over the last two weeks; she is taking 3 pills and twenty units in the morning and ten units in the evening She thinks the metformin is making her head feel funny; she does not want to take it any more and now she just wants to take insulin  She has been taking her warfarin, around 7 o'clock in the evening; she takes 1 mg+ 6 mg on Mondays and Fridays, and just 6 mg the rest of the week  She had an infection of her back, did not grow out significant bacteria and antibiotic was stopped  She has been having sinus issues; nose is running and congested, stopped up; headache behind the eyes; draining sinuses; everyone says it's the weather; no fevers; ears itch; no sore throat; no swollen glands  Suffers with anxiety and insomnia; using the lorazepam; we have tried in the past to decrease this and it caused him significant stress and difficulty  Relevant past medical, surgical, family and social history reviewed and updated as indicated. Interim medical history since our last visit reviewed. Allergies and medications reviewed and  updated.  Review of Systems Per HPI unless specifically indicated above     Objective:    BP 169/73 mmHg  Pulse 83  Temp(Src) 97.3 F (36.3 C)  Wt 199 lb (90.266 kg)  SpO2 96%  LMP  (LMP Unknown)  Wt Readings from Last 3 Encounters:  05/03/15 199 lb (90.266 kg)  04/16/15 195 lb (88.451 kg)  03/21/15 202 lb (91.627 kg)    Physical Exam  Constitutional: She appears well-developed and well-nourished.  Elderly female, no distres  Eyes: No scleral icterus.  Neck: No JVD present.  Cardiovascular: Normal rate.   Pulmonary/Chest: Effort normal and breath sounds normal.  Abdominal: She exhibits no distension.  Neurological: She is alert. She displays no tremor.  Skin: Lesion (site of previously infected, draining sebaceous cyst on the upper back, no active drainage; no erythema) noted. No pallor.  Psychiatric: She has a normal mood and affect.   Results for orders placed or performed in visit on 05/03/15  CoaguChek XS/INR Waived  Result Value Ref Range   INR 1.9 (H) 0.9 - 1.1   Prothrombin Time 22.5 sec      Assessment & Plan:   Problem List Items Addressed This Visit      Cardiovascular and Mediastinum   Benign hypertension (Chronic)    Not controlled; increase  CCB, try to follow DASH guidelines; discussed risk of stroke, reasons to call 911      Relevant Medications   amLODipine (NORVASC) 10 MG tablet   rosuvastatin (CRESTOR) 10 MG tablet   Paroxysmal atrial fibrillation (HCC)    See AVS for warfarin instructions; goal INR 2 to 3      Relevant Medications   amLODipine (NORVASC) 10 MG tablet   rosuvastatin (CRESTOR) 10 MG tablet     Other   Monitoring for anticoagulant use (Chronic)    Check INR      Relevant Orders   Protime-INR   Diabetes mellitus type 2, uncontrolled, without complications (HCC) - Primary (Chronic)    Foot exam today; next A1c due in march; increase the insulin gradually; she wants to come off of the metformin, believes it is causing her  side effects; I explained that it would be standard of care to have her on this, but we are a team and I respect that she wants to stop the metformin      Relevant Medications   insulin NPH-regular Human (NOVOLIN 70/30) (70-30) 100 UNIT/ML injection   rosuvastatin (CRESTOR) 10 MG tablet   Hyperlipidemia (Chronic)    Patient has just started back to taking atorvastatin, was having leg aches and all kinds of symptoms; we'll stop the atorvastatin and start 10 mg Crestor      Relevant Medications   amLODipine (NORVASC) 10 MG tablet   rosuvastatin (CRESTOR) 10 MG tablet   Abscess of back    Refer back to surgeon for evaluation and possible excision of area on upper back      Relevant Orders   Ambulatory referral to General Surgery      Follow up plan: Return in about 3 weeks (around 05/24/2015) for medicine changes, diabetes.  An after-visit summary was printed and given to the patient at [facility].  Please see the patient instructions which may contain other information and recommendations beyond what is mentioned above in the assessment and plan.  Face-to-face time with patient was more than 25 minutes, >50% time spent counseling and coordination of care

## 2015-05-03 NOTE — Patient Instructions (Addendum)
Increase your insulin as follows: Friday Feb 3rd, Saturday, and Sunday:  Twenty-two units in the morning and eleven units in the evening Monday Feb 6th, Tuesday, and Wednesday: Twenty-four units in the morning and twelve units in the evening Thursday Feb 9th and on:  Twenty-six units in the morning and thirteen units in the evening Call me with updates of your sugar readings on Monday February 13th Check sugars three times a day before meals Increase the amlodipine from 7.5 mg daily to 10 mg daily New coumadin / warfarin instructions: 7 mg on Mondays, Wednesdays, and Fridays 6 mg on Tuesdays, Thursdays, Saturdays, and Sundays Stop the metformin at your request Stop the atorvastatin Start Crestor (rosuvastatin) 10 mg at bedtime cholesterol Try to limit saturated fats in your diet (bologna, hot dogs, barbeque, cheeseburgers, hamburgers, steak, bacon, sausage, cheese, etc.) and get more fresh fruits, vegetables, and whole grains

## 2015-05-03 NOTE — Assessment & Plan Note (Signed)
Patient has just started back to taking atorvastatin, was having leg aches and all kinds of symptoms; we'll stop the atorvastatin and start 10 mg Crestor

## 2015-05-03 NOTE — Assessment & Plan Note (Signed)
Foot exam today; next A1c due in march; increase the insulin gradually; she wants to come off of the metformin, believes it is causing her side effects; I explained that it would be standard of care to have her on this, but we are a team and I respect that she wants to stop the metformin

## 2015-05-04 NOTE — Assessment & Plan Note (Signed)
Same spot as a few years ago; will start antibiotic just for a few days while awaiting culture results; cautioned about C diff, see AVS

## 2015-05-04 NOTE — Assessment & Plan Note (Signed)
Not to goal; patient noncompliant with her medicines; URGED her to get back on medicine to bring her BP down; try to follow DASH guidleines

## 2015-05-04 NOTE — Assessment & Plan Note (Signed)
Patient had wanted to stop insulin; adjust meds now at her request

## 2015-05-04 NOTE — Assessment & Plan Note (Signed)
Continue anti-coagulation; goal INR 2 to 3

## 2015-05-04 NOTE — Assessment & Plan Note (Signed)
Goal INR 2-3, recurrent DVTs and paroxysmal atrial fibrillation; adjust dose slightly, close f/u; see AVS

## 2015-05-11 ENCOUNTER — Other Ambulatory Visit: Payer: Self-pay | Admitting: Family Medicine

## 2015-05-11 NOTE — Telephone Encounter (Signed)
rx approved

## 2015-05-13 NOTE — Assessment & Plan Note (Signed)
Not controlled; increase CCB, try to follow DASH guidelines; discussed risk of stroke, reasons to call 911

## 2015-05-13 NOTE — Assessment & Plan Note (Signed)
See AVS for warfarin instructions; goal INR 2 to 3

## 2015-05-14 ENCOUNTER — Telehealth: Payer: Self-pay

## 2015-05-14 DIAGNOSIS — E1165 Type 2 diabetes mellitus with hyperglycemia: Principal | ICD-10-CM

## 2015-05-14 DIAGNOSIS — Z794 Long term (current) use of insulin: Principal | ICD-10-CM

## 2015-05-14 DIAGNOSIS — IMO0001 Reserved for inherently not codable concepts without codable children: Secondary | ICD-10-CM

## 2015-05-14 NOTE — Telephone Encounter (Signed)
Patient says you explained to her monitor blood sugar. She's been trying not to take insulin but she says she wants to go back on it.  Her blood sugars have been in 200's.

## 2015-05-15 MED ORDER — INSULIN NPH ISOPHANE & REGULAR (70-30) 100 UNIT/ML ~~LOC~~ SUSP: SUBCUTANEOUS | Status: DC

## 2015-05-15 NOTE — Telephone Encounter (Signed)
I spoke with patient She is indeed using her insulin, message below is wrong she says; she IS taking her insulin She is using 26 units in the morning and 13 in the evenings --> let's increase to 28 and 15 Sugars are all in the 200s She has a head cold; plain coricidin is okay if something absolutely needed, but rest and hydration and vit C and green tea

## 2015-05-21 ENCOUNTER — Encounter: Payer: Self-pay | Admitting: Family Medicine

## 2015-05-21 ENCOUNTER — Ambulatory Visit (INDEPENDENT_AMBULATORY_CARE_PROVIDER_SITE_OTHER): Payer: Commercial Managed Care - HMO | Admitting: Family Medicine

## 2015-05-21 VITALS — BP 162/77 | HR 85 | Temp 98.9°F | Ht 63.0 in | Wt 193.6 lb

## 2015-05-21 DIAGNOSIS — I10 Essential (primary) hypertension: Secondary | ICD-10-CM | POA: Diagnosis not present

## 2015-05-21 DIAGNOSIS — E1165 Type 2 diabetes mellitus with hyperglycemia: Secondary | ICD-10-CM | POA: Diagnosis not present

## 2015-05-21 DIAGNOSIS — Z794 Long term (current) use of insulin: Secondary | ICD-10-CM | POA: Diagnosis not present

## 2015-05-21 DIAGNOSIS — Z5181 Encounter for therapeutic drug level monitoring: Secondary | ICD-10-CM

## 2015-05-21 DIAGNOSIS — I480 Paroxysmal atrial fibrillation: Secondary | ICD-10-CM | POA: Diagnosis not present

## 2015-05-21 DIAGNOSIS — IMO0001 Reserved for inherently not codable concepts without codable children: Secondary | ICD-10-CM

## 2015-05-21 DIAGNOSIS — Z7901 Long term (current) use of anticoagulants: Secondary | ICD-10-CM | POA: Diagnosis not present

## 2015-05-21 LAB — COAGUCHEK XS/INR WAIVED
INR: 1.9 — ABNORMAL HIGH (ref 0.9–1.1)
PROTHROMBIN TIME: 23.4 s

## 2015-05-21 MED ORDER — INSULIN NPH ISOPHANE & REGULAR (70-30) 100 UNIT/ML ~~LOC~~ SUSP: SUBCUTANEOUS | Status: DC

## 2015-05-21 MED ORDER — WARFARIN SODIUM 1 MG PO TABS: ORAL_TABLET | ORAL | Status: DC

## 2015-05-21 NOTE — Progress Notes (Signed)
BP 162/77 mmHg  Pulse 85  Temp(Src) 98.9 F (37.2 C)  Ht 5\' 3"  (1.6 m)  Wt 193 lb 9.6 oz (87.816 kg)  BMI 34.30 kg/m2  SpO2 97%  LMP  (LMP Unknown)   Subjective:    Patient ID: Daisy Watkins, female    DOB: 06/24/1932, 82 y.o.   MRN: [id number]  HPI: Daisy Watkins is a 82 y.o. female  Chief Complaint  Patient presents with  . Follow-up  . Medication Management    Insulin pen is not working. blood sugars are still up. (NOVOLIN)   Type 2 diabetes Her sugars are high; 200 and 300s; dry mouth, urinating at night a lot, getting better Taking the medicine Used to take 45 units in the morning, 32 in the evening; then stopped her insulin; did not want to take insulin at all; now wants to take it again; most recenlty taking 32 units in the AM and 16 in the evening  Also due for INR; recent slight increase in dose; no bleeding, no problems  Relevant past medical, surgical history reviewed Allergies and medications reviewed and updated.  Review of Systems Per HPI unless specifically indicated above     Objective:    BP 162/77 mmHg  Pulse 85  Temp(Src) 98.9 F (37.2 C)  Ht 5\' 3"  (1.6 m)  Wt 193 lb 9.6 oz (87.816 kg)  BMI 34.30 kg/m2  SpO2 97%  LMP  (LMP Unknown)  Wt Readings from Last 3 Encounters:  05/21/15 193 lb 9.6 oz (87.816 kg)  05/03/15 199 lb (90.266 kg)  04/16/15 195 lb (88.451 kg)    Physical Exam  Constitutional: She appears well-developed and well-nourished. No distress.  Weight down 5+ pounds since last visit  Cardiovascular: Normal rate.   Pulmonary/Chest: Effort normal.  Musculoskeletal: She exhibits no edema.  Skin: Skin is warm. No pallor.  Psychiatric: She has a normal mood and affect.     Results for orders placed or performed in visit on 05/21/15  CoaguChek XS/INR Waived  Result Value Ref Range   INR 1.9 (H) 0.9 - 1.1   Prothrombin Time 23.4 sec      Assessment & Plan:   Problem List Items Addressed This Visit      Cardiovascular and  Mediastinum   Benign hypertension (Chronic)    Not controlled; increase amlodipine to 10 mg; recheck in 2 weeks      Relevant Medications   amLODipine (NORVASC) 10 MG tablet   warfarin (COUMADIN) 1 MG tablet   Paroxysmal atrial fibrillation (HCC)    INR slightly sub-therapeutic Take 7 mg on Mondays, Wednesdays, and Fridays Take 6 mg all other days Recheck in 2 weeks      Relevant Medications   amLODipine (NORVASC) 10 MG tablet   warfarin (COUMADIN) 1 MG tablet     Other   Monitoring for anticoagulant use (Chronic)    Check INR today, 1.9 INR slightly sub-therapeutic Take 7 mg on Mondays, Wednesdays, and Fridays Take 6 mg all other days Recheck in 2 weeks      Relevant Orders   Protime-INR   CoaguChek XS/INR Waived (Completed)   Diabetes mellitus type 2, uncontrolled, without complications (HCC) - Primary (Chronic)    Increase your insulin as directed to 36 units in the morning and 18 units in the evening Then increase again after 3 days by 4 units in the morning (to 40 units) and 2 units in the evening (to 20 units) if your sugars are not  under 200 Call me next week with an update      Relevant Medications   insulin NPH-regular Human (NOVOLIN 70/30) (70-30) 100 UNIT/ML injection      Follow up plan: Return in about 2 weeks (around 06/04/2015) for INR check.  Meds ordered this encounter  Medications  . DISCONTD: amLODipine (NORVASC) 5 MG tablet    Sig: Take 5 mg by mouth.  Marland Kitchen amLODipine (NORVASC) 10 MG tablet    Sig: 10 mg.  . insulin NPH-regular Human (NOVOLIN 70/30) (70-30) 100 UNIT/ML injection    Sig: Increase as directed to thirty-six units SQ every morning with breakfast and eighteen units before evening meal or as directed    Dispense:  2 vial    Refill:  1    New instructions  . warfarin (COUMADIN) 1 MG tablet    Sig: Take one PO on Mondays, Wed, and Friday along with a 6 mg pill to equal 7 mg those days, OR as otherwise directed    Dispense:  16 tablet     Refill:  2   An after-visit summary was printed and given to the patient at [facility].  Please see the patient instructions which may contain other information and recommendations beyond what is mentioned above in the assessment and plan.  Orders Placed This Encounter  Procedures  . Protime-INR  . CoaguChek XS/INR [employer]

## 2015-05-21 NOTE — Patient Instructions (Signed)
GINA-- patient needs note saying she was here  Take 7 mg on Mondays, Wednesdays, and Fridays Take 6 mg all other days Increase your insulin as directed to 36 units in the morning and 18 units in the evening Then increase again after 3 days by 4 units in the morning (to 40 units) and 2 units in the evening (to 20 units) if your sugars are not under 200 Call me next week with an update Return in 2 weeks for INR check and glucose

## 2015-05-21 NOTE — Assessment & Plan Note (Addendum)
Check INR today, 1.9 INR slightly sub-therapeutic Take 7 mg on Mondays, Wednesdays, and Fridays Take 6 mg all other days Recheck in 2 weeks

## 2015-05-24 ENCOUNTER — Ambulatory Visit: Payer: Commercial Managed Care - HMO | Admitting: Surgery

## 2015-05-28 DIAGNOSIS — I480 Paroxysmal atrial fibrillation: Secondary | ICD-10-CM | POA: Diagnosis not present

## 2015-05-28 DIAGNOSIS — E782 Mixed hyperlipidemia: Secondary | ICD-10-CM | POA: Diagnosis not present

## 2015-05-28 DIAGNOSIS — I10 Essential (primary) hypertension: Secondary | ICD-10-CM | POA: Diagnosis not present

## 2015-06-02 NOTE — Assessment & Plan Note (Signed)
Not controlled; increase amlodipine to 10 mg; recheck in 2 weeks

## 2015-06-02 NOTE — Assessment & Plan Note (Signed)
Increase your insulin as directed to 36 units in the morning and 18 units in the evening Then increase again after 3 days by 4 units in the morning (to 40 units) and 2 units in the evening (to 20 units) if your sugars are not under 200 Call me next week with an update

## 2015-06-02 NOTE — Assessment & Plan Note (Signed)
INR slightly sub-therapeutic Take 7 mg on Mondays, Wednesdays, and Fridays Take 6 mg all other days Recheck in 2 weeks

## 2015-06-04 ENCOUNTER — Ambulatory Visit: Payer: Commercial Managed Care - HMO | Admitting: Family Medicine

## 2015-06-05 ENCOUNTER — Ambulatory Visit: Payer: Self-pay | Admitting: Surgery

## 2015-06-07 ENCOUNTER — Encounter: Payer: Self-pay | Admitting: Surgery

## 2015-06-07 ENCOUNTER — Ambulatory Visit (INDEPENDENT_AMBULATORY_CARE_PROVIDER_SITE_OTHER): Payer: Commercial Managed Care - HMO | Admitting: Surgery

## 2015-06-07 VITALS — BP 137/79 | HR 76 | Temp 98.8°F | Ht 63.0 in | Wt 200.0 lb

## 2015-06-07 DIAGNOSIS — L723 Sebaceous cyst: Secondary | ICD-10-CM | POA: Diagnosis not present

## 2015-06-07 NOTE — Patient Instructions (Signed)
As we discussed today, we will not be able to do remove this cyst on your back because your risk for stroke without coumadin far outweighs the possibility that this will get infected again.  It is not currently infected and this is not the source of your Right Shoulder Pain that you are experiencing. You will need to see your PCP for this shoulder pain.  Please call our office if this becomes extremely red, inflamed, or begins to drain and we would be glad to see you to talk about antibiotics or lancing this area to allow it to drain.

## 2015-06-07 NOTE — Progress Notes (Signed)
Daisy Watkins is an 82 y.o. female.   Chief Complaint: Sebaceous cyst HPI: This patient with a sebaceous cyst on her back which has been infected in the past. She has diabetes and is chronically anticoagulated for atrial fibrillation. He has had this sebaceous cyst for at least a year and it was drained by Dr. Sanda Klein last year. Is not required antibiotics it is not been infected for a year but she states she has right shoulder pain that she attributes to this sebaceous cyst  Past Medical History  Diagnosis Date  . Diabetes mellitus without complication ([place])   . Hyperlipidemia   . Hypertension   . Anxiety   . GERD (gastroesophageal reflux disease)   . Insomnia   . Vitamin D deficiency disease   . Vitamin B12 deficiency   . DVT (deep venous thrombosis) ([place]) 2006    chronic anticoagulation    Past Surgical History  Procedure Laterality Date  . Abdominal hysterectomy      Family History  Problem Relation Age of Onset  . Diabetes Maternal Grandmother   . Cancer Daughter     breast  . Heart disease Son   . Heart disease Son    Social History:  reports that she quit smoking about 38 years ago. Her smoking use included Cigarettes. She has a 15 pack-year smoking history. She has never used smokeless tobacco. She reports that she does not drink alcohol or use illicit drugs.  Allergies:  Allergies  Allergen Reactions  . Sulfur      (Not in a hospital admission)   Review of Systems:   Review of Systems  Constitutional: Negative.   HENT: Negative.   Eyes: Negative.   Respiratory: Negative.   Cardiovascular: Negative.   Gastrointestinal: Negative.   Musculoskeletal: Positive for joint pain.       Left shoulder pain  Skin: Negative.   Neurological: Negative.     Physical Exam:  Physical Exam  Constitutional: She is well-developed, well-nourished, and in no distress. No distress.  HENT:  Head: Normocephalic and atraumatic.  Eyes: Right eye exhibits no discharge. Left  eye exhibits no discharge. No scleral icterus.  Neck: Normal range of motion.  Cardiovascular: Normal rate.   Irregular rate and rhythm  Musculoskeletal: Normal range of motion. She exhibits no edema.  Lymphadenopathy:    She has no cervical adenopathy.  Skin: She is not diaphoretic.  Noninfected sebaceous cyst with scar present from prior I&D in the mid back  Vitals reviewed.   There were no vitals taken for this visit.    No results found for this or any previous visit (from the past 48 hour(s)). No results found.   Assessment/Plan Right shoulder pain completely unrelated to this noninfected sebaceous cyst of the mid back in a different dermatome then the shoulder. With the patient's atrial fibrillation as well as her need for chronic anticoagulation and the lack of infection or symptoms from this sebaceous cyst I would not recommend any sort of surgical intervention for this unless it becomes infected which time I&D and antibiotics are appropriate. Her risk of stroke is much higher than the risk of ectopic a patient from an infected sebaceous cyst  Florene Glen, MD, FACS

## 2015-06-11 ENCOUNTER — Ambulatory Visit (INDEPENDENT_AMBULATORY_CARE_PROVIDER_SITE_OTHER): Payer: Commercial Managed Care - HMO | Admitting: Family Medicine

## 2015-06-11 ENCOUNTER — Encounter: Payer: Self-pay | Admitting: Family Medicine

## 2015-06-11 VITALS — BP 156/80 | HR 82 | Temp 97.0°F | Wt 198.0 lb

## 2015-06-11 DIAGNOSIS — L723 Sebaceous cyst: Secondary | ICD-10-CM

## 2015-06-11 DIAGNOSIS — R350 Frequency of micturition: Secondary | ICD-10-CM | POA: Diagnosis not present

## 2015-06-11 DIAGNOSIS — Z7901 Long term (current) use of anticoagulants: Secondary | ICD-10-CM | POA: Diagnosis not present

## 2015-06-11 DIAGNOSIS — I480 Paroxysmal atrial fibrillation: Secondary | ICD-10-CM | POA: Diagnosis not present

## 2015-06-11 DIAGNOSIS — I4891 Unspecified atrial fibrillation: Secondary | ICD-10-CM | POA: Diagnosis not present

## 2015-06-11 DIAGNOSIS — Z5181 Encounter for therapeutic drug level monitoring: Secondary | ICD-10-CM

## 2015-06-11 HISTORY — DX: Sebaceous cyst: L72.3

## 2015-06-11 LAB — UA/M W/RFLX CULTURE, ROUTINE
BILIRUBIN UA: NEGATIVE
Ketones, UA: NEGATIVE
LEUKOCYTES UA: NEGATIVE
Nitrite, UA: NEGATIVE
PH UA: 5 (ref 5.0–7.5)
Protein, UA: NEGATIVE
Specific Gravity, UA: 1.02 (ref 1.005–1.030)
UUROB: 0.2 mg/dL (ref 0.2–1.0)

## 2015-06-11 LAB — COAGUCHEK XS/INR WAIVED
INR: 2.4 — ABNORMAL HIGH (ref 0.9–1.1)
Prothrombin Time: 28.5 s

## 2015-06-11 LAB — MICROSCOPIC EXAMINATION

## 2015-06-11 NOTE — Assessment & Plan Note (Signed)
Upper back; appreciate evaluation and management recommendation by surgeon; she'll contact him for treatment if recurs

## 2015-06-11 NOTE — Assessment & Plan Note (Signed)
Continue coumadin at same dose; INR therapeutic today; so glad her cardiologist visit went well and she received a good report

## 2015-06-11 NOTE — Progress Notes (Signed)
BP 156/80 mmHg  Pulse 82  Temp(Src) 97 F (36.1 C)  Wt 198 lb (89.812 kg)  SpO2 97%  LMP  (LMP Unknown)   Subjective:    Patient ID: Daisy Watkins, female    DOB: Jun 18, 1932, 82 y.o.   MRN: [id number]  HPI: Daisy GWIAZDOWSKI is a 82 y.o. female  Chief Complaint  Patient presents with  . Atrial Fibrillation    she takes 7mg , Tu., Wed, Friday and takes 6mg  on the other days.  . Urinary Tract Infection    she is having some itching and burning and frequency  . Other    Patient will call and get eye exam scheduled    She saw her heart doctor; BP and cholesterol were good; Dr. Alveria Apley note reviewed from Feb 27th; BP was 128/78; her BP runs up here at this office  She also saw the surgeon; no surgery for the sebaceous cyst on her back planned right now; risk of bleeding, on coumadin; see note (reviewed earlier)  She thinks she has a urinary tract infection; burning and itching; going on for a few weeks; no blood in the urine; little strong smell; urinary frequency, more at night; no fevers; having external itching and irritation; she declined exam today  Coumadin management; INR today is 2.4; no nosebleeds or blood in urine or stool; current instructions: Take 7 mg on Mondays, Wednesdays, and Fridays Take 6 mg all other days I clarified with what was written above in chief complaint; she says my instructions are correct, not the Tu, Wed, Friday as noted at the top  Relevant past medical, surgical history reviewed Interim medical history since our last visit reviewed. Allergies and medications reviewed and updated.  Review of Systems  Respiratory: Negative for shortness of breath.   Cardiovascular: Negative for chest pain.  Genitourinary: Positive for dysuria and frequency.  Per HPI unless specifically indicated above     Objective:    BP 156/80 mmHg  Pulse 82  Temp(Src) 97 F (36.1 C)  Wt 198 lb (89.812 kg)  SpO2 97%  LMP  (LMP Unknown)  Wt Readings from Last 3  Encounters:  06/11/15 198 lb (89.812 kg)  06/07/15 200 lb (90.719 kg)  05/21/15 193 lb 9.6 oz (87.816 kg)    Physical Exam  Constitutional: She appears well-developed and well-nourished.  Cardiovascular: Normal rate.  An irregularly irregular rhythm present.  Pulmonary/Chest: Effort normal and breath sounds normal.  Abdominal: She exhibits no distension. There is no tenderness.  Skin:  Site of sebaceous cyst on upper back is not draining, no erythema  Psychiatric: She has a normal mood and affect.   Results for orders placed or performed in visit on 06/11/15  Microscopic Examination  Result Value Ref Range   WBC, UA 0-5 0 -  5 /hpf   RBC, UA 0-2 0 -  2 /hpf   Epithelial Cells (non renal) 0-10 0 - 10 /hpf   Bacteria, UA Few None seen/Few  CoaguChek XS/INR Waived  Result Value Ref Range   INR 2.4 (H) 0.9 - 1.1   Prothrombin Time 28.5 sec  UA/M w/rflx Culture, Routine (STAT)  Result Value Ref Range   Specific Gravity, UA 1.020 1.005 - 1.030   pH, UA 5.0 5.0 - 7.5   Color, UA Yellow Yellow   Appearance Ur Cloudy (A) Clear   Leukocytes, UA Negative Negative   Protein, UA Negative Negative/Trace   Glucose, UA 3+ (A) Negative   Ketones, UA Negative Negative  RBC, UA Trace (A) Negative   Bilirubin, UA Negative Negative   Urobilinogen, Ur 0.2 0.2 - 1.0 mg/dL   Nitrite, UA Negative Negative   Microscopic Examination See below:       Assessment & Plan:   Problem List Items Addressed This Visit      Cardiovascular and Mediastinum   Paroxysmal atrial fibrillation (HCC)    Continue coumadin at same dose; INR therapeutic today; so glad her cardiologist visit went well and she received a good report        Musculoskeletal and Integument   Sebaceous cyst    Upper back; appreciate evaluation and management recommendation by surgeon; she'll contact him for treatment if recurs        Other   Monitoring for anticoagulant use (Chronic)    Goal INR 2 to 3; today's INR is 2.4;  continue same dose, recheck INR in one month       Other Visit Diagnoses    Atrial fibrillation, unspecified    -  Primary    Relevant Orders    CoaguChek XS/INR Waived (Completed)    Urinary frequency        check urine today    Relevant Orders    UA/M w/rflx Culture, Routine (STAT) (Completed)       Follow up plan: Return in about 1 month (around 07/12/2015) for visit and INR check at [facility].  An after-visit summary was printed and given to the patient at [place].  Please see the patient instructions which may contain other information and recommendations beyond what is mentioned above in the assessment and plan.

## 2015-06-11 NOTE — Patient Instructions (Addendum)
You can pick up some SARNA HC at the drugstore or ask your pharmacist for something similar for irritation We'll see what your urine shows Coumadin / warfarin instructions: Take 7 mg on Mondays, Wednesdays, and Fridays Take 6 mg all other days Recheck INR in one month at [facility]

## 2015-06-11 NOTE — Assessment & Plan Note (Signed)
Goal INR 2 to 3; today's INR is 2.4; continue same dose, recheck INR in one month

## 2015-06-29 ENCOUNTER — Telehealth: Payer: Self-pay | Admitting: Unknown Physician Specialty

## 2015-06-29 DIAGNOSIS — E1165 Type 2 diabetes mellitus with hyperglycemia: Principal | ICD-10-CM

## 2015-06-29 DIAGNOSIS — Z794 Long term (current) use of insulin: Principal | ICD-10-CM

## 2015-06-29 DIAGNOSIS — IMO0001 Reserved for inherently not codable concepts without codable children: Secondary | ICD-10-CM

## 2015-06-29 MED ORDER — INSULIN NPH ISOPHANE & REGULAR (70-30) 100 UNIT/ML ~~LOC~~ SUSP: SUBCUTANEOUS | Status: DC

## 2015-06-29 NOTE — Telephone Encounter (Signed)
Routing to provider. Patient was seeing Dr. Sanda Klein but has an appointment with [place] 07/06/15.

## 2015-06-29 NOTE — Telephone Encounter (Signed)
Pt called and stated that she is out of insulin and would like it to be called in to [email or web address]

## 2015-07-02 ENCOUNTER — Other Ambulatory Visit: Payer: Self-pay

## 2015-07-02 DIAGNOSIS — Z794 Long term (current) use of insulin: Principal | ICD-10-CM

## 2015-07-02 DIAGNOSIS — IMO0001 Reserved for inherently not codable concepts without codable children: Secondary | ICD-10-CM

## 2015-07-02 DIAGNOSIS — E1165 Type 2 diabetes mellitus with hyperglycemia: Principal | ICD-10-CM

## 2015-07-02 MED ORDER — INSULIN NPH ISOPHANE & REGULAR (70-30) 100 UNIT/ML ~~LOC~~ SUSP: SUBCUTANEOUS | Status: DC

## 2015-07-02 NOTE — Telephone Encounter (Signed)
Pharmacy sent a fax requesting a refill for this patient's insulin. They state that the patient said she was told to take 45 units in the morning and 30 in the evening. When this was sent on Friday, the rx said no print so it never got to the pharmacy or printed.

## 2015-07-06 ENCOUNTER — Ambulatory Visit: Payer: Commercial Managed Care - HMO | Admitting: Unknown Physician Specialty

## 2015-07-06 ENCOUNTER — Other Ambulatory Visit: Payer: Self-pay | Admitting: Family Medicine

## 2015-07-06 NOTE — Telephone Encounter (Signed)
Patient seeing [facility] provider on 07/16/15 rx sent

## 2015-07-16 ENCOUNTER — Encounter: Payer: Self-pay | Admitting: Unknown Physician Specialty

## 2015-07-16 ENCOUNTER — Ambulatory Visit (INDEPENDENT_AMBULATORY_CARE_PROVIDER_SITE_OTHER): Payer: Commercial Managed Care - HMO | Admitting: Unknown Physician Specialty

## 2015-07-16 VITALS — BP 144/84 | HR 76 | Temp 98.3°F | Ht 63.0 in | Wt 203.8 lb

## 2015-07-16 DIAGNOSIS — E785 Hyperlipidemia, unspecified: Secondary | ICD-10-CM

## 2015-07-16 DIAGNOSIS — E1165 Type 2 diabetes mellitus with hyperglycemia: Secondary | ICD-10-CM

## 2015-07-16 DIAGNOSIS — Z794 Long term (current) use of insulin: Secondary | ICD-10-CM

## 2015-07-16 DIAGNOSIS — I10 Essential (primary) hypertension: Secondary | ICD-10-CM | POA: Diagnosis not present

## 2015-07-16 DIAGNOSIS — F411 Generalized anxiety disorder: Secondary | ICD-10-CM | POA: Diagnosis not present

## 2015-07-16 DIAGNOSIS — Z5181 Encounter for therapeutic drug level monitoring: Secondary | ICD-10-CM

## 2015-07-16 DIAGNOSIS — Z7901 Long term (current) use of anticoagulants: Secondary | ICD-10-CM

## 2015-07-16 DIAGNOSIS — IMO0001 Reserved for inherently not codable concepts without codable children: Secondary | ICD-10-CM

## 2015-07-16 LAB — LIPID PANEL PICCOLO, WAIVED
CHOL/HDL RATIO PICCOLO,WAIVE: 4 mg/dL
Cholesterol Piccolo, Waived: 256 mg/dL — ABNORMAL HIGH (ref <200)
HDL CHOL PICCOLO, WAIVED: 65 mg/dL (ref >59)
LDL Chol Calc Piccolo Waived: 136 mg/dL — ABNORMAL HIGH (ref <100)
TRIGLYCERIDES PICCOLO,WAIVED: 280 mg/dL — AB (ref <150)
VLDL CHOL CALC PICCOLO,WAIVE: 56 mg/dL — AB (ref <30)

## 2015-07-16 LAB — BAYER DCA HB A1C WAIVED: HB A1C: 9 % — AB (ref <7.0)

## 2015-07-16 LAB — COAGUCHEK XS/INR WAIVED
INR: 2.1 — ABNORMAL HIGH (ref 0.9–1.1)
Prothrombin Time: 25.6 s

## 2015-07-16 MED ORDER — ROSUVASTATIN CALCIUM 20 MG PO TABS: 20.0000 mg | ORAL_TABLET | Freq: Every day | ORAL | Status: DC

## 2015-07-16 MED ORDER — LORAZEPAM 1 MG PO TABS: 1.0000 mg | ORAL_TABLET | Freq: Every day | ORAL | Status: DC

## 2015-07-16 NOTE — Assessment & Plan Note (Signed)
Stable on recheck.  Continue present medications

## 2015-07-16 NOTE — Patient Instructions (Addendum)
titrating evening insulin up 2 units every time AM blood sugar is 12o  Stop Magesium and stop allergy medications

## 2015-07-16 NOTE — Assessment & Plan Note (Signed)
Refill Lorazepam

## 2015-07-16 NOTE — Assessment & Plan Note (Addendum)
Hgb A1C is 9.0%.  Pt is unable to tell me why it has increased so much.  Will begin titrating evening insulin up 2 units every time AM blood sugar is 120

## 2015-07-16 NOTE — Assessment & Plan Note (Signed)
INR 2.1.  Continue present medicationg.

## 2015-07-16 NOTE — Progress Notes (Signed)
BP 144/84 mmHg  Pulse 76  Temp(Src) 98.3 F (36.8 C)  Ht 5\' 3"  (1.6 m)  Wt 203 lb 12.8 oz (92.443 kg)  BMI 36.11 kg/m2  SpO2 97%  LMP  (LMP Unknown)   Subjective:    Patient ID: Daisy Watkins, female    DOB: 1932-08-30, 82 y.o.   MRN: [id number]  HPI: Daisy Watkins is a 82 y.o. female  Chief Complaint  Patient presents with  . Diabetes    pt states she knows she is due for eye exam  . Hyperlipidemia  . Hypertension   Diabetes:  Takes 45 units of NPH in the AM and 30 mg in the evening Using medications without difficulties No hypoglycemic episodes No hyperglycemic episodes: if she doesn't eat the right things Feet problems: none Blood Sugars averaging: 144 eye exam within last year  Hypertension:  States it is high when she comes to the doctor.  Typically high here.   Using medications without difficulty Average home BP: SBP in the 140s  Using medication without problems or lightheadedness No chest pain with exertion or shortness of breath No Edema  Elevated Cholesterol: Using medications without problems No Muscle aches Diet compliance: Eats well Exercise: still works with [employer].    Coumadin/A-fib:  Takes 7 mg 3 days a week and 6 mg the other days..   Anxiety Takes Lorazepam nightly.  Needs a refill   Relevant past medical, surgical, family and social history reviewed and updated as indicated. Interim medical history since our last visit reviewed. Allergies and medications reviewed and updated.  Review of Systems  Constitutional: Negative.   HENT: Negative.   Eyes: Negative.   Respiratory: Negative.   Cardiovascular: Negative.   Gastrointestinal: Negative.   Endocrine: Negative.   Genitourinary: Negative.   Musculoskeletal: Negative.   Skin: Negative.   Allergic/Immunologic: Negative.   Neurological: Negative.   Hematological: Negative.   Psychiatric/Behavioral: Negative.     Per HPI unless specifically indicated above     Objective:     BP 144/84 mmHg  Pulse 76  Temp(Src) 98.3 F (36.8 C)  Ht 5\' 3"  (1.6 m)  Wt 203 lb 12.8 oz (92.443 kg)  BMI 36.11 kg/m2  SpO2 97%  LMP  (LMP Unknown)  Wt Readings from Last 3 Encounters:  07/16/15 203 lb 12.8 oz (92.443 kg)  06/11/15 198 lb (89.812 kg)  06/07/15 200 lb (90.719 kg)    Physical Exam  Constitutional: She is oriented to person, place, and time. She appears well-developed and well-nourished. No distress.  HENT:  Head: Normocephalic and atraumatic.  Eyes: Conjunctivae and lids are normal. Right eye exhibits no discharge. Left eye exhibits no discharge. No scleral icterus.  Neck: Normal range of motion. Neck supple. No JVD present. Carotid bruit is not present.  Cardiovascular: An irregular rhythm present.  Pulmonary/Chest: Effort normal and breath sounds normal.  Abdominal: Normal appearance. There is no splenomegaly or hepatomegaly.  Musculoskeletal: Normal range of motion.  Neurological: She is alert and oriented to person, place, and time.  Skin: Skin is warm, dry and intact. No rash noted. No pallor.  Psychiatric: She has a normal mood and affect. Her behavior is normal. Judgment and thought content normal.      Assessment & Plan:   Problem List Items Addressed This Visit      Unprioritized   Anxiety, generalized    Refill Lorazepam      Benign hypertension (Chronic)    Stable on recheck.  Continue present medications      Relevant Medications   rosuvastatin (CRESTOR) 20 MG tablet   Other Relevant Orders   Comprehensive metabolic panel   Diabetes mellitus type 2, uncontrolled, without complications (HCC) - Primary (Chronic)    Hgb A1C is 9.0%.  Pt is unable to tell me why it has increased so much.  Will begin titrating evening insulin up 2 units every time AM blood sugar is 120      Relevant Medications   rosuvastatin (CRESTOR) 20 MG tablet   Other Relevant Orders   Bayer DCA Hb A1c Waived   Comprehensive metabolic panel   Hyperlipidemia  (Chronic)    LDL is 135.  Increase Crestor to 20 mg.        Relevant Medications   rosuvastatin (CRESTOR) 20 MG tablet   Other Relevant Orders   Lipid Panel [place], Waived   Monitoring for anticoagulant use (Chronic)    INR 2.1.  Continue present medicationg.        Relevant Orders   CoaguChek XS/INR Waived       Follow up plan: Return in about 4 weeks (around 08/13/2015) for coumadin and evaluated BS logs.

## 2015-07-16 NOTE — Assessment & Plan Note (Signed)
LDL is 135.  Increase Crestor to 20 mg.

## 2015-07-17 LAB — COMPREHENSIVE METABOLIC PANEL
ALBUMIN: 4.2 g/dL (ref 3.5–4.7)
ALK PHOS: 109 IU/L (ref 39–117)
ALT: 18 IU/L (ref 0–32)
AST: 18 IU/L (ref 0–40)
Albumin/Globulin Ratio: 1.9 (ref 1.2–2.2)
BUN / CREAT RATIO: 16 (ref 12–28)
BUN: 11 mg/dL (ref 8–27)
Bilirubin Total: 0.5 mg/dL (ref 0.0–1.2)
CALCIUM: 9.8 mg/dL (ref 8.7–10.3)
CO2: 24 mmol/L (ref 18–29)
CREATININE: 0.67 mg/dL (ref 0.57–1.00)
Chloride: 100 mmol/L (ref 96–106)
GFR, EST AFRICAN AMERICAN: 96 mL/min/{1.73_m2} (ref >59)
GFR, EST NON AFRICAN AMERICAN: 83 mL/min/{1.73_m2} (ref >59)
GLOBULIN, TOTAL: 2.2 g/dL (ref 1.5–4.5)
GLUCOSE: 272 mg/dL — AB (ref 65–99)
Potassium: 4.9 mmol/L (ref 3.5–5.2)
SODIUM: 141 mmol/L (ref 134–144)
TOTAL PROTEIN: 6.4 g/dL (ref 6.0–8.5)

## 2015-07-18 ENCOUNTER — Other Ambulatory Visit: Payer: Self-pay | Admitting: Family Medicine

## 2015-08-13 ENCOUNTER — Ambulatory Visit: Payer: 59 | Admitting: Unknown Physician Specialty

## 2015-08-17 ENCOUNTER — Ambulatory Visit (INDEPENDENT_AMBULATORY_CARE_PROVIDER_SITE_OTHER): Payer: 59 | Admitting: Unknown Physician Specialty

## 2015-08-17 ENCOUNTER — Encounter: Payer: Self-pay | Admitting: Unknown Physician Specialty

## 2015-08-17 ENCOUNTER — Other Ambulatory Visit: Payer: Self-pay | Admitting: Family Medicine

## 2015-08-17 VITALS — BP 154/75 | HR 80 | Temp 97.6°F | Ht 62.8 in | Wt 202.4 lb

## 2015-08-17 DIAGNOSIS — IMO0001 Reserved for inherently not codable concepts without codable children: Secondary | ICD-10-CM

## 2015-08-17 DIAGNOSIS — E1165 Type 2 diabetes mellitus with hyperglycemia: Secondary | ICD-10-CM

## 2015-08-17 DIAGNOSIS — I480 Paroxysmal atrial fibrillation: Secondary | ICD-10-CM | POA: Diagnosis not present

## 2015-08-17 DIAGNOSIS — Z794 Long term (current) use of insulin: Secondary | ICD-10-CM

## 2015-08-17 DIAGNOSIS — K5900 Constipation, unspecified: Secondary | ICD-10-CM | POA: Diagnosis not present

## 2015-08-17 DIAGNOSIS — Z7901 Long term (current) use of anticoagulants: Secondary | ICD-10-CM | POA: Diagnosis not present

## 2015-08-17 DIAGNOSIS — I10 Essential (primary) hypertension: Secondary | ICD-10-CM | POA: Diagnosis not present

## 2015-08-17 DIAGNOSIS — F411 Generalized anxiety disorder: Secondary | ICD-10-CM

## 2015-08-17 DIAGNOSIS — Z5181 Encounter for therapeutic drug level monitoring: Secondary | ICD-10-CM

## 2015-08-17 HISTORY — DX: Constipation, unspecified: K59.00

## 2015-08-17 LAB — COAGUCHEK XS/INR WAIVED
INR: 1.9 — AB (ref 0.9–1.1)
PROTHROMBIN TIME: 23 s

## 2015-08-17 MED ORDER — LORAZEPAM 1 MG PO TABS: 1.0000 mg | ORAL_TABLET | Freq: Every day | ORAL | Status: DC

## 2015-08-17 NOTE — Assessment & Plan Note (Signed)
INR is 1.9.  Typically to goal.  Will the same dose and recheck next month

## 2015-08-17 NOTE — Assessment & Plan Note (Signed)
Recommended Miralax

## 2015-08-17 NOTE — Progress Notes (Signed)
BP 154/75 mmHg  Pulse 80  Temp(Src) 97.6 F (36.4 C)  Ht 5' 2.8" (1.595 m)  Wt 202 lb 6.4 oz (91.808 kg)  BMI 36.09 kg/m2  SpO2 97%  LMP  (LMP Unknown)   Subjective:    Patient ID: Daisy Watkins, female    DOB: 04-03-1932, 82 y.o.   MRN: [id number]  HPI: Daisy Watkins is a 82 y.o. female  Chief Complaint  Patient presents with  . Diabetes  . Atrial Fibrillation    PT/INR   Diabetes:  DM under poor control.  Last visit we gave her instructions to titrate up insulin  for elevated morning blood sugar.  Her blood sugars in the morning have been in the 90's so she has not increased.  Blood sugar higher in the evening Takes 45 units 70/30 in the AM and 30 mg in the evening Using medications without difficulties No hypoglycemic episodes No hyperglycemic episodes: if she doesn't eat the right things Feet problems: none eye exam within last year  Hypertension:  States it is high when she comes to the doctor.  Typically high here.   Using medications without difficulty Average home BP: SBP in the 140s  Using medication without problems or lightheadedness No chest pain with exertion or shortness of breath No Edema   Coumadin/A-fib:  Takes 7 mg 3 days a week and 6 mg the other days..   Anxiety Takes Lorazepam nightly.  Needs a refill.  Has Panic attacks   Relevant past medical, surgical, family and social history reviewed and updated as indicated. Interim medical history since our last visit reviewed. Allergies and medications reviewed and updated.  Review of Systems  Constitutional: Negative.   HENT: Negative.   Eyes: Negative.   Respiratory: Negative.   Cardiovascular: Negative.   Gastrointestinal: Negative.        Constipation and gas  Endocrine: Negative.   Genitourinary: Negative.   Musculoskeletal: Negative.   Skin: Negative.   Allergic/Immunologic: Negative.   Neurological: Negative.   Hematological: Negative.   Psychiatric/Behavioral: Negative.     Per  HPI unless specifically indicated above     Objective:    BP 154/75 mmHg  Pulse 80  Temp(Src) 97.6 F (36.4 C)  Ht 5' 2.8" (1.595 m)  Wt 202 lb 6.4 oz (91.808 kg)  BMI 36.09 kg/m2  SpO2 97%  LMP  (LMP Unknown)  Wt Readings from Last 3 Encounters:  08/17/15 202 lb 6.4 oz (91.808 kg)  07/16/15 203 lb 12.8 oz (92.443 kg)  06/11/15 198 lb (89.812 kg)    Physical Exam  Constitutional: She is oriented to person, place, and time. She appears well-developed and well-nourished. No distress.  HENT:  Head: Normocephalic and atraumatic.  Eyes: Conjunctivae and lids are normal. Right eye exhibits no discharge. Left eye exhibits no discharge. No scleral icterus.  Neck: Normal range of motion. Neck supple. No JVD present. Carotid bruit is not present.  Cardiovascular: An irregular rhythm present.  Pulmonary/Chest: Effort normal and breath sounds normal.  Abdominal: Normal appearance. There is no splenomegaly or hepatomegaly.  Musculoskeletal: Normal range of motion.  Neurological: She is alert and oriented to person, place, and time.  Skin: Skin is warm, dry and intact. No rash noted. No pallor.  Psychiatric: She has a normal mood and affect. Her behavior is normal. Judgment and thought content normal.      Assessment & Plan:   Problem List Items Addressed This Visit      Unprioritized  Anxiety, generalized    Takes Ativan nightly.  Wants something to take during the day but is refusing a SSRI at this time but states we can talk about it later      Benign hypertension (Chronic)   Constipation    Recommended Miralax      Diabetes mellitus type 2, uncontrolled, without complications (HCC) (Chronic)    Blood sugar is good in the AM and really "trying" to eat better.  She is on 70/30 insulin using vials      Monitoring for anticoagulant use (Chronic)    INR is 1.9.  Typically to goal.  Will the same dose and recheck next month      Paroxysmal atrial fibrillation ([place]) - Primary    Relevant Orders   CoaguChek XS/INR Waived       Follow up plan: Return in about 1 month (around 09/17/2015).

## 2015-08-17 NOTE — Assessment & Plan Note (Signed)
Takes Ativan nightly.  Wants something to take during the day but is refusing a SSRI at this time but states we can talk about it later

## 2015-08-17 NOTE — Assessment & Plan Note (Signed)
Blood sugar is good in the AM and really "trying" to eat better.  She is on 70/30 insulin using vials

## 2015-08-24 ENCOUNTER — Other Ambulatory Visit: Payer: Self-pay | Admitting: Unknown Physician Specialty

## 2015-08-24 ENCOUNTER — Other Ambulatory Visit: Payer: Self-pay | Admitting: Family Medicine

## 2015-08-31 ENCOUNTER — Other Ambulatory Visit: Payer: Self-pay | Admitting: Family Medicine

## 2015-08-31 ENCOUNTER — Other Ambulatory Visit: Payer: Self-pay | Admitting: Unknown Physician Specialty

## 2015-09-08 ENCOUNTER — Other Ambulatory Visit: Payer: Self-pay | Admitting: Family Medicine

## 2015-09-14 ENCOUNTER — Other Ambulatory Visit: Payer: Self-pay | Admitting: Family Medicine

## 2015-09-14 NOTE — Telephone Encounter (Signed)
Due to recheck INR unless she is getting checks elsewhere

## 2015-09-17 ENCOUNTER — Ambulatory Visit: Payer: 59 | Admitting: Unknown Physician Specialty

## 2015-09-19 ENCOUNTER — Encounter: Payer: Self-pay | Admitting: Unknown Physician Specialty

## 2015-09-19 ENCOUNTER — Ambulatory Visit (INDEPENDENT_AMBULATORY_CARE_PROVIDER_SITE_OTHER): Payer: Commercial Managed Care - HMO | Admitting: Unknown Physician Specialty

## 2015-09-19 VITALS — BP 150/73 | HR 85 | Temp 98.3°F | Ht 62.6 in | Wt 208.4 lb

## 2015-09-19 DIAGNOSIS — E1165 Type 2 diabetes mellitus with hyperglycemia: Secondary | ICD-10-CM

## 2015-09-19 DIAGNOSIS — F322 Major depressive disorder, single episode, severe without psychotic features: Secondary | ICD-10-CM

## 2015-09-19 DIAGNOSIS — Z794 Long term (current) use of insulin: Secondary | ICD-10-CM | POA: Diagnosis not present

## 2015-09-19 DIAGNOSIS — F329 Major depressive disorder, single episode, unspecified: Secondary | ICD-10-CM | POA: Insufficient documentation

## 2015-09-19 DIAGNOSIS — IMO0001 Reserved for inherently not codable concepts without codable children: Secondary | ICD-10-CM

## 2015-09-19 DIAGNOSIS — I480 Paroxysmal atrial fibrillation: Secondary | ICD-10-CM

## 2015-09-19 LAB — COAGUCHEK XS/INR WAIVED
INR: 2.1 — AB (ref 0.9–1.1)
Prothrombin Time: 25.3 s

## 2015-09-19 MED ORDER — SERTRALINE HCL 50 MG PO TABS: 50.0000 mg | ORAL_TABLET | Freq: Every day | ORAL | Status: DC

## 2015-09-19 NOTE — Progress Notes (Signed)
BP 150/73 mmHg  Pulse 85  Temp(Src) 98.3 F (36.8 C)  Ht 5' 2.6" (1.59 m)  Wt 208 lb 6.4 oz (94.53 kg)  BMI 37.39 kg/m2  SpO2 98%  LMP  (LMP Unknown)   Subjective:    Patient ID: Daisy Watkins, female    DOB: 1932/09/24, 82 y.o.   MRN: [id number]  HPI: Daisy Watkins is a 82 y.o. female  Chief Complaint  Patient presents with  . Atrial Fibrillation    PT/INR   Coumadin Taking 7 mg 3 days/week and 6 mg the other days.  No problems with bruising or blood in urine.    Diabetes Taking 45 units in the AM and 32 in the Evening of 70/30 BS has ranged from 99-136.  Generally lower in the mornings.   Last Hgb A1C was 9 and pt unable to identify why that is.    Depression/anxiety States she is having a problem with this.  She takes Lorazepam every night had has for quite some time.  She would like something during the day for her nerves Depression screen [facility] 2/9 09/19/2015 09/21/2014  Decreased Interest 3 1  Down, Depressed, Hopeless 3 1  PHQ - 2 Score 6 2  Altered sleeping 3 -  Tired, decreased energy 3 -  Change in appetite 3 -  Feeling bad or failure about yourself  2 -  Trouble concentrating 0 -  Moving slowly or fidgety/restless 0 -  Suicidal thoughts 0 -  PHQ-9 Score 17 -  Difficult doing work/chores Very difficult -       Relevant past medical, surgical, family and social history reviewed and updated as indicated. Interim medical history since our last visit reviewed. Allergies and medications reviewed and updated.  Review of Systems  Per HPI unless specifically indicated above     Objective:    BP 150/73 mmHg  Pulse 85  Temp(Src) 98.3 F (36.8 C)  Ht 5' 2.6" (1.59 m)  Wt 208 lb 6.4 oz (94.53 kg)  BMI 37.39 kg/m2  SpO2 98%  LMP  (LMP Unknown)  Wt Readings from Last 3 Encounters:  09/19/15 208 lb 6.4 oz (94.53 kg)  08/17/15 202 lb 6.4 oz (91.808 kg)  07/16/15 203 lb 12.8 oz (92.443 kg)    Physical Exam  Constitutional: She is oriented to person,  place, and time. She appears well-developed and well-nourished. No distress.  HENT:  Head: Normocephalic and atraumatic.  Eyes: Conjunctivae and lids are normal. Right eye exhibits no discharge. Left eye exhibits no discharge. No scleral icterus.  Cardiovascular: Normal rate.   Pulmonary/Chest: Effort normal.  Abdominal: Normal appearance. There is no splenomegaly or hepatomegaly.  Musculoskeletal: Normal range of motion.  Neurological: She is alert and oriented to person, place, and time.  Skin: Skin is intact. No rash noted. No pallor.  Psychiatric: She has a normal mood and affect. Her behavior is normal. Judgment and thought content normal.    Results for orders placed or performed in visit on 08/17/15  CoaguChek XS/INR Waived  Result Value Ref Range   INR 1.9 (H) 0.9 - 1.1   Prothrombin Time 23.0 sec      Assessment & Plan:   Problem List Items Addressed This Visit      Unprioritized   Diabetes mellitus type 2, uncontrolled, without complications (HCC) (Chronic)    BS readings seems to be good continue with present dose.  Check Hgb A1 C in 2 months      Major  depression ([place])    Start Sertraline 50 mg daily      Relevant Medications   sertraline (ZOLOFT) 50 MG tablet   Paroxysmal atrial fibrillation (HCC) - Primary   Relevant Orders   CoaguChek XS/INR Waived       Hgb A1C is 3.1.  Continue present dose.    Follow up plan: Return in about 4 weeks (around 10/17/2015).

## 2015-09-19 NOTE — Assessment & Plan Note (Addendum)
BS readings seems to be good continue with present dose.  Check Hgb A1 C in 2 months

## 2015-09-19 NOTE — Assessment & Plan Note (Signed)
Start Sertraline 50 mg daily 

## 2015-10-17 ENCOUNTER — Ambulatory Visit: Payer: Commercial Managed Care - HMO | Admitting: Unknown Physician Specialty

## 2015-10-19 ENCOUNTER — Other Ambulatory Visit: Payer: Self-pay | Admitting: Unknown Physician Specialty

## 2015-10-22 ENCOUNTER — Ambulatory Visit (INDEPENDENT_AMBULATORY_CARE_PROVIDER_SITE_OTHER): Payer: Commercial Managed Care - HMO | Admitting: Unknown Physician Specialty

## 2015-10-22 ENCOUNTER — Telehealth: Payer: Self-pay

## 2015-10-22 ENCOUNTER — Encounter: Payer: Self-pay | Admitting: Unknown Physician Specialty

## 2015-10-22 VITALS — BP 160/62 | Temp 98.2°F | Ht 63.6 in | Wt 208.6 lb

## 2015-10-22 DIAGNOSIS — Z0000 Encounter for general adult medical examination without abnormal findings: Secondary | ICD-10-CM | POA: Diagnosis not present

## 2015-10-22 DIAGNOSIS — I480 Paroxysmal atrial fibrillation: Secondary | ICD-10-CM

## 2015-10-22 LAB — COAGUCHEK XS/INR WAIVED
INR: 1.7 — ABNORMAL HIGH (ref 0.9–1.1)
Prothrombin Time: 20.8 s

## 2015-10-22 MED ORDER — INSULIN NPH ISOPHANE & REGULAR (70-30) 100 UNIT/ML ~~LOC~~ SUSP: SUBCUTANEOUS | 12 refills | Status: DC

## 2015-10-22 NOTE — Progress Notes (Signed)
   BP (!) 160/62 (BP Location: Left Arm, Patient Position: Sitting, Cuff Size: Large)   Temp 98.2 F (36.8 C)   Ht 5' 3.6" (1.615 m) Comment: pt had shoes on  Wt 208 lb 9.6 oz (94.6 kg) Comment: pt had shoes on  LMP  (LMP Unknown)   SpO2 95%   BMI 36.26 kg/m    Subjective:    Patient ID: Daisy Watkins, female    DOB: Apr 23, 1932, 82 y.o.   MRN: [id number]  HPI: Daisy Watkins is a 82 y.o. female  Chief Complaint  Patient presents with  . Atrial Fibrillation  . Labs Only    pt states she needs a TB test   Coumadin Taking 7 mg 3 days/week and 6 mg the other days.  No problems with bruising or blood in urine.  No change for quite some time.    Relevant past medical, surgical, family and social history reviewed and updated as indicated. Interim medical history since our last visit reviewed. Allergies and medications reviewed and updated.  Review of Systems  Per HPI unless specifically indicated above     Objective:    BP (!) 160/62 (BP Location: Left Arm, Patient Position: Sitting, Cuff Size: Large)   Temp 98.2 F (36.8 C)   Ht 5' 3.6" (1.615 m) Comment: pt had shoes on  Wt 208 lb 9.6 oz (94.6 kg) Comment: pt had shoes on  LMP  (LMP Unknown)   SpO2 95%   BMI 36.26 kg/m   Wt Readings from Last 3 Encounters:  10/22/15 208 lb 9.6 oz (94.6 kg)  09/19/15 208 lb 6.4 oz (94.5 kg)  08/17/15 202 lb 6.4 oz (91.8 kg)    Physical Exam  Constitutional: She is oriented to person, place, and time. She appears well-developed and well-nourished. No distress.  HENT:  Head: Normocephalic and atraumatic.  Eyes: Conjunctivae and lids are normal. Right eye exhibits no discharge. Left eye exhibits no discharge. No scleral icterus.  Cardiovascular: Normal rate.   Pulmonary/Chest: Effort normal.  Abdominal: Normal appearance. There is no splenomegaly or hepatomegaly.  Musculoskeletal: Normal range of motion.  Neurological: She is alert and oriented to person, place, and time.  Skin: Skin is  intact. No rash noted. No pallor.  Psychiatric: She has a normal mood and affect. Her behavior is normal. Judgment and thought content normal.   +-++++  Assessment & Plan:   Problem List Items Addressed This Visit      Unprioritized   Paroxysmal atrial fibrillation (HCC) - Primary    Increase Coumadin to 7 mg 4 days/week.  And 6 mg the other days      Relevant Orders   CoaguChek XS/INR Waived    Other Visit Diagnoses    Routine general medical examination at a health care facility       Relevant Orders   Quantiferon tb gold assay (blood)       Follow up plan: Return in about 4 weeks (around 11/19/2015) for INR and DM check.

## 2015-10-22 NOTE — Telephone Encounter (Signed)
Pharmacy sent a fax about patient's Novolin rx. So I called and spoke to them. They state that the rx says Human and it should not. They need [place] to change the rx to say Novolin 70/30. [facility].

## 2015-10-22 NOTE — Assessment & Plan Note (Signed)
Increase Coumadin to 7 mg 4 days/week.  And 6 mg the other days

## 2015-10-22 NOTE — Telephone Encounter (Signed)
Malachy Mood said OK to give pharmacy verbal for novolin so I did.

## 2015-10-27 LAB — QUANTIFERON IN TUBE
QFT TB AG MINUS NIL VALUE: 0 [IU]/mL
QUANTIFERON MITOGEN VALUE: 2.98 IU/mL
QUANTIFERON TB AG VALUE: 0.04 [IU]/mL
QUANTIFERON TB GOLD: NEGATIVE
Quantiferon Nil Value: 0.04 IU/mL

## 2015-10-27 LAB — QUANTIFERON TB GOLD ASSAY (BLOOD)

## 2015-11-02 ENCOUNTER — Other Ambulatory Visit: Payer: Self-pay | Admitting: Family Medicine

## 2015-11-19 ENCOUNTER — Other Ambulatory Visit: Payer: Self-pay | Admitting: Unknown Physician Specialty

## 2015-11-19 ENCOUNTER — Ambulatory Visit: Payer: Commercial Managed Care - HMO | Admitting: Unknown Physician Specialty

## 2015-11-19 NOTE — Telephone Encounter (Signed)
Your patient 

## 2015-11-23 ENCOUNTER — Ambulatory Visit (INDEPENDENT_AMBULATORY_CARE_PROVIDER_SITE_OTHER): Payer: Commercial Managed Care - HMO | Admitting: Unknown Physician Specialty

## 2015-11-23 ENCOUNTER — Encounter: Payer: Self-pay | Admitting: Unknown Physician Specialty

## 2015-11-23 ENCOUNTER — Other Ambulatory Visit: Payer: Self-pay

## 2015-11-23 VITALS — BP 131/73 | HR 81 | Temp 97.8°F | Ht 64.0 in | Wt 209.2 lb

## 2015-11-23 DIAGNOSIS — Z794 Long term (current) use of insulin: Secondary | ICD-10-CM

## 2015-11-23 DIAGNOSIS — I10 Essential (primary) hypertension: Secondary | ICD-10-CM

## 2015-11-23 DIAGNOSIS — Z7901 Long term (current) use of anticoagulants: Secondary | ICD-10-CM

## 2015-11-23 DIAGNOSIS — E1165 Type 2 diabetes mellitus with hyperglycemia: Secondary | ICD-10-CM

## 2015-11-23 DIAGNOSIS — J320 Chronic maxillary sinusitis: Secondary | ICD-10-CM

## 2015-11-23 DIAGNOSIS — J309 Allergic rhinitis, unspecified: Secondary | ICD-10-CM | POA: Diagnosis not present

## 2015-11-23 DIAGNOSIS — I480 Paroxysmal atrial fibrillation: Secondary | ICD-10-CM | POA: Diagnosis not present

## 2015-11-23 DIAGNOSIS — IMO0001 Reserved for inherently not codable concepts without codable children: Secondary | ICD-10-CM

## 2015-11-23 DIAGNOSIS — Z5181 Encounter for therapeutic drug level monitoring: Secondary | ICD-10-CM

## 2015-11-23 LAB — HEMOGLOBIN A1C: Hemoglobin A1C: 8.4

## 2015-11-23 LAB — COAGUCHEK XS/INR WAIVED
INR: 1.8 — AB (ref 0.9–1.1)
Prothrombin Time: 21.2 s

## 2015-11-23 LAB — BAYER DCA HB A1C WAIVED: HB A1C (BAYER DCA - WAIVED): 8.4 % — ABNORMAL HIGH (ref <7.0)

## 2015-11-23 MED ORDER — INSULIN NPH ISOPHANE & REGULAR (70-30) 100 UNIT/ML ~~LOC~~ SUSP: SUBCUTANEOUS | 12 refills | Status: DC

## 2015-11-23 MED ORDER — CEFUROXIME AXETIL 250 MG PO TABS: 250.0000 mg | ORAL_TABLET | Freq: Two times a day (BID) | ORAL | 0 refills | Status: DC

## 2015-11-23 NOTE — Assessment & Plan Note (Signed)
Stable, continue present medications.   

## 2015-11-23 NOTE — Assessment & Plan Note (Addendum)
Hgb A1C 8.4 poor control but improved.  Will write for more vials so she does not have to decrease the amount of insulin she takes

## 2015-11-23 NOTE — Assessment & Plan Note (Signed)
Increase Coumadin to 7 mg 4 days/week and 6 mg 3 days/week

## 2015-11-23 NOTE — Assessment & Plan Note (Signed)
Use Flonase daily.  rx for Cefixime

## 2015-11-23 NOTE — Progress Notes (Signed)
BP 131/73 (BP Location: Left Arm, Cuff Size: Large)   Pulse 81   Temp 97.8 F (36.6 C)   Ht 5\' 4"  (1.626 m)   Wt 209 lb 3.2 oz (94.9 kg)   LMP  (LMP Unknown)   SpO2 96%   BMI 35.91 kg/m    Subjective:    Patient ID: Daisy Watkins, female    DOB: 12-28-1932, 82 y.o.   MRN: [id number]  HPI: Daisy Watkins is a 82 y.o. female  Chief Complaint  Patient presents with  . Diabetes    pt states she is going to make eye exam appointment   . Atrial Fibrillation    PR/INR check  . URI    pt states she has had sinus pressure and congestion. Pt states her ears have also been itching and all of these symptoms have been going on for a while.    Diabetes: Using medications without difficulties.  Takes 45 u of 70/30 in the AM and 30 u in the Evening.  Pt states she has not increased her insulin since last seen as discussed.  b It seems she is decreasing the dose to make her bottles last No hypoglycemic episodes No hyperglycemic episodes Feet problems: Notes they itch Blood Sugars averaging: 178 this AM eye exam within last year: plans to get it done Last Hgb A1C: 9.0 4 months ago  Hypertension  Using medications without difficulty Average home BPs sometimes and varies  Using medication without problems or lightheadedness No chest pain with exertion or shortness of breath No Edema  Elevated Cholesterol Using medications without problems No Muscle aches  Diet: Tries to eat more vegetables and fruits Exercise:Walks sometimes  Sinusitis: Patient presents with sinuses bothering her for a month.  Ears congested and having trouble smelling.  Clear drainage.  She is taking Flonase when she thinks about it.    INR Taking 7 mg MWF and 6 mg four days a week.     HPI Relevant past medical, surgical, family and social history reviewed and updated as indicated. Interim medical history since our last visit reviewed. Allergies and medications reviewed and updated.  Review of Systems  Per  HPI unless specifically indicated above     Objective:    BP 131/73 (BP Location: Left Arm, Cuff Size: Large)   Pulse 81   Temp 97.8 F (36.6 C)   Ht 5\' 4"  (1.626 m)   Wt 209 lb 3.2 oz (94.9 kg)   LMP  (LMP Unknown)   SpO2 96%   BMI 35.91 kg/m   Wt Readings from Last 3 Encounters:  11/23/15 209 lb 3.2 oz (94.9 kg)  10/22/15 208 lb 9.6 oz (94.6 kg)  09/19/15 208 lb 6.4 oz (94.5 kg)    Physical Exam  Constitutional: She is oriented to person, place, and time. She appears well-developed and well-nourished. No distress.  HENT:  Head: Normocephalic and atraumatic.  Eyes: Conjunctivae and lids are normal. Right eye exhibits no discharge. Left eye exhibits no discharge. No scleral icterus.  Neck: Normal range of motion. Neck supple. No JVD present. Carotid bruit is not present.  Cardiovascular: Normal rate, regular rhythm and normal heart sounds.   Pulmonary/Chest: Effort normal and breath sounds normal.  Abdominal: Normal appearance. There is no splenomegaly or hepatomegaly.  Musculoskeletal: Normal range of motion.  Neurological: She is alert and oriented to person, place, and time.  Skin: Skin is warm, dry and intact. No rash noted. No pallor.  Psychiatric: She  has a normal mood and affect. Her behavior is normal. Judgment and thought content normal.    Results for orders placed or performed in visit on 10/22/15  CoaguChek XS/INR Waived  Result Value Ref Range   INR 1.7 (H) 0.9 - 1.1   Prothrombin Time 20.8 sec  Quantiferon tb gold assay (blood)  Result Value Ref Range   QUANTIFERON INCUBATION Comment   QuantiFERON In Tube  Result Value Ref Range   QUANTIFERON TB GOLD Negative Negative   QUANTIFERON CRITERIA Comment    QUANTIFERON TB AG VALUE 0.04 IU/mL   Quantiferon Nil Value 0.04 IU/mL   QUANTIFERON MITOGEN VALUE 2.98 IU/mL   QFT TB AG MINUS NIL VALUE 0.00 IU/mL   Interpretation: Comment       Assessment & Plan:   Problem List Items Addressed This Visit       Unprioritized   Allergic rhinitis    Use Flonase daily.  rx for Cefixime      Benign hypertension (Chronic)    Stable, continue present medications.        Diabetes mellitus type 2, uncontrolled, without complications (HCC) (Chronic)    Hgb A1C 8.4 poor control but improved.  Will write for more vials so she does not have to decrease the amount of insulin she takes      Relevant Medications   insulin NPH-regular Human (NOVOLIN 70/30) (70-30) 100 UNIT/ML injection   Other Relevant Orders   Microalbumin, Urine Waived   Bayer DCA Hb A1c Waived   Comprehensive metabolic panel   Monitoring for anticoagulant use (Chronic)    Increase Coumadin to 7 mg 4 days/week and 6 mg 3 days/week      Paroxysmal atrial fibrillation (HCC) - Primary   Relevant Orders   CoaguChek XS/INR Waived    Other Visit Diagnoses    Essential hypertension       Chronic maxillary sinusitis       Trial of antibiotics.     Relevant Medications   cefUROXime (CEFTIN) 250 MG tablet       Follow up plan: Return in about 4 weeks (around 12/21/2015).

## 2015-12-11 ENCOUNTER — Other Ambulatory Visit: Payer: Self-pay

## 2015-12-11 ENCOUNTER — Telehealth: Payer: Self-pay | Admitting: Family Medicine

## 2015-12-11 NOTE — Telephone Encounter (Signed)
Pt requesting refill on Lorazepam.  Pt states pharmacy was supposed to send request yesterday and now she is out and can't sleep.  Please call pt to let her know the status.

## 2015-12-11 NOTE — Telephone Encounter (Signed)
Refill request was just routed to provider.

## 2015-12-12 MED ORDER — LORAZEPAM 1 MG PO TABS: 1.0000 mg | ORAL_TABLET | Freq: Every day | ORAL | 0 refills | Status: DC

## 2015-12-12 NOTE — Telephone Encounter (Signed)
Called and left patient a voicemail letting her know that medication was called in.

## 2015-12-12 NOTE — Telephone Encounter (Signed)
OK to call in

## 2015-12-18 ENCOUNTER — Other Ambulatory Visit: Payer: Self-pay | Admitting: Unknown Physician Specialty

## 2015-12-18 NOTE — Telephone Encounter (Signed)
Your patient 

## 2015-12-21 ENCOUNTER — Ambulatory Visit: Payer: Commercial Managed Care - HMO | Admitting: Unknown Physician Specialty

## 2015-12-25 ENCOUNTER — Ambulatory Visit (INDEPENDENT_AMBULATORY_CARE_PROVIDER_SITE_OTHER): Payer: Commercial Managed Care - HMO | Admitting: Unknown Physician Specialty

## 2015-12-25 ENCOUNTER — Encounter: Payer: Self-pay | Admitting: Unknown Physician Specialty

## 2015-12-25 VITALS — BP 123/74 | HR 90 | Temp 98.3°F | Ht 63.1 in | Wt 208.8 lb

## 2015-12-25 DIAGNOSIS — Z794 Long term (current) use of insulin: Secondary | ICD-10-CM | POA: Diagnosis not present

## 2015-12-25 DIAGNOSIS — I480 Paroxysmal atrial fibrillation: Secondary | ICD-10-CM

## 2015-12-25 DIAGNOSIS — E1165 Type 2 diabetes mellitus with hyperglycemia: Secondary | ICD-10-CM | POA: Diagnosis not present

## 2015-12-25 DIAGNOSIS — IMO0001 Reserved for inherently not codable concepts without codable children: Secondary | ICD-10-CM

## 2015-12-25 DIAGNOSIS — Z23 Encounter for immunization: Secondary | ICD-10-CM | POA: Diagnosis not present

## 2015-12-25 LAB — COAGUCHEK XS/INR WAIVED
INR: 2.3 — AB (ref 0.9–1.1)
PROTHROMBIN TIME: 27.7 s

## 2015-12-25 NOTE — Patient Instructions (Addendum)

## 2015-12-25 NOTE — Assessment & Plan Note (Signed)
INR is 2.3.  Continue present dose

## 2015-12-25 NOTE — Progress Notes (Signed)
   BP 123/74 (BP Location: Left Arm, Patient Position: Sitting, Cuff Size: Large)   Pulse 90   Temp 98.3 F (36.8 C)   Ht 5' 3.1" (1.603 m)   Wt 208 lb 12.8 oz (94.7 kg)   LMP  (LMP Unknown)   SpO2 97%   BMI 36.87 kg/m    Subjective:    Patient ID: Daisy Watkins, female    DOB: Oct 25, 1932, 82 y.o.   MRN: [id number]  HPI: DENISS FREUNDLICH is a 82 y.o. female  Chief Complaint  Patient presents with  . Atrial Fibrillation    PT/INR   Coumadin For a-fib.  Taking 7 mg 3 days/week and 6 mg the other days.  No problems with bruising or blood in urine.  No change for quite some time.    Diabetes States she takes 30 units 70/30 in the evening and 45 in the morning.  She states her blood sugar does not go above 150.  Not running out of insulin    Relevant past medical, surgical, family and social history reviewed and updated as indicated. Interim medical history since our last visit reviewed. Allergies and medications reviewed and updated.  Review of Systems  Per HPI unless specifically indicated above     Objective:    BP 123/74 (BP Location: Left Arm, Patient Position: Sitting, Cuff Size: Large)   Pulse 90   Temp 98.3 F (36.8 C)   Ht 5' 3.1" (1.603 m)   Wt 208 lb 12.8 oz (94.7 kg)   LMP  (LMP Unknown)   SpO2 97%   BMI 36.87 kg/m   Wt Readings from Last 3 Encounters:  12/25/15 208 lb 12.8 oz (94.7 kg)  11/23/15 209 lb 3.2 oz (94.9 kg)  10/22/15 208 lb 9.6 oz (94.6 kg)    Physical Exam  Constitutional: She is oriented to person, place, and time. She appears well-developed and well-nourished. No distress.  HENT:  Head: Normocephalic and atraumatic.  Eyes: Conjunctivae and lids are normal. Right eye exhibits no discharge. Left eye exhibits no discharge. No scleral icterus.  Cardiovascular: Normal rate.   Pulmonary/Chest: Effort normal.  Abdominal: Normal appearance. There is no splenomegaly or hepatomegaly.  Musculoskeletal: Normal range of motion.  Neurological: She is  alert and oriented to person, place, and time.  Skin: Skin is intact. No rash noted. No pallor.  Psychiatric: She has a normal mood and affect. Her behavior is normal. Judgment and thought content normal.   +-++++  Assessment & Plan:   Problem List Items Addressed This Visit      Unprioritized   Diabetes mellitus type 2, uncontrolled, without complications ([place]) (Chronic)    Seems to be doing better with improved numbers at home      Paroxysmal atrial fibrillation (HCC)    INR is 2.3.  Continue present dose      Relevant Orders   CoaguChek XS/INR Waived (Completed)    Other Visit Diagnoses    Need for influenza vaccination    -  Primary   Relevant Orders   Flu vaccine HIGH DOSE PF (Completed)       Follow up plan: Return in about 4 weeks (around 01/22/2016).

## 2015-12-25 NOTE — Assessment & Plan Note (Signed)
Seems to be doing better with improved numbers at home

## 2016-01-01 ENCOUNTER — Other Ambulatory Visit: Payer: Self-pay | Admitting: Unknown Physician Specialty

## 2016-01-04 ENCOUNTER — Other Ambulatory Visit: Payer: Self-pay

## 2016-01-04 MED ORDER — LORAZEPAM 1 MG PO TABS: 1.0000 mg | ORAL_TABLET | Freq: Every day | ORAL | 0 refills | Status: DC

## 2016-01-04 NOTE — Telephone Encounter (Signed)
Pharmacy states on refill request that this is a refill request to be filled on 01/10/16.

## 2016-01-09 ENCOUNTER — Other Ambulatory Visit: Payer: Self-pay | Admitting: Family Medicine

## 2016-01-10 ENCOUNTER — Other Ambulatory Visit: Payer: Self-pay

## 2016-01-11 ENCOUNTER — Ambulatory Visit (INDEPENDENT_AMBULATORY_CARE_PROVIDER_SITE_OTHER): Payer: Commercial Managed Care - HMO | Admitting: Unknown Physician Specialty

## 2016-01-11 ENCOUNTER — Encounter: Payer: Self-pay | Admitting: Unknown Physician Specialty

## 2016-01-11 VITALS — BP 147/76 | HR 96 | Temp 98.6°F | Ht 64.0 in | Wt 206.2 lb

## 2016-01-11 DIAGNOSIS — J00 Acute nasopharyngitis [common cold]: Secondary | ICD-10-CM

## 2016-01-11 DIAGNOSIS — R05 Cough: Secondary | ICD-10-CM

## 2016-01-11 DIAGNOSIS — R059 Cough, unspecified: Secondary | ICD-10-CM

## 2016-01-11 LAB — VERITOR FLU A/B WAIVED
INFLUENZA B: NEGATIVE
Influenza A: NEGATIVE

## 2016-01-11 MED ORDER — WARFARIN SODIUM 1 MG PO TABS: 1.0000 mg | ORAL_TABLET | Freq: Every day | ORAL | 0 refills | Status: DC

## 2016-01-11 MED ORDER — FLUTICASONE PROPIONATE 50 MCG/ACT NA SUSP: 2.0000 | Freq: Every day | NASAL | 12 refills | Status: DC

## 2016-01-11 MED ORDER — GUAIFENESIN-CODEINE 100-10 MG/5ML PO SOLN: 10.0000 mL | Freq: Three times a day (TID) | ORAL | 0 refills | Status: DC | PRN

## 2016-01-11 NOTE — Progress Notes (Signed)
BP (!) 147/76 (BP Location: Left Arm, Patient Position: Sitting, Cuff Size: Large)   Pulse 96   Temp 98.6 F (37 C)   Ht 5\' 4"  (1.626 m)   Wt 206 lb 3.2 oz (93.5 kg)   LMP  (LMP Unknown)   SpO2 97%   BMI 35.39 kg/m    Subjective:    Patient ID: Daisy Watkins, female    DOB: [birth year], 82 y.o.   MRN: [id number]  HPI: VIOLAR WHINNERY is a 82 y.o. female  Chief Complaint  Patient presents with  . URI    pt states she has been sneezing, coughing, nasal congestion, ear pain, and little yellow phlegm. States she started feeling bad last week, but states it got worse Wednesday    URI   This is a new problem. The current episode started in the past 7 days. The problem has been gradually worsening. There has been no fever. Associated symptoms include congestion, coughing, rhinorrhea and sneezing. She has tried nothing for the symptoms.    Relevant past medical, surgical, family and social history reviewed and updated as indicated. Interim medical history since our last visit reviewed. Allergies and medications reviewed and updated.  Review of Systems  HENT: Positive for congestion, rhinorrhea and sneezing.   Respiratory: Positive for cough.     Per HPI unless specifically indicated above     Objective:    BP (!) 147/76 (BP Location: Left Arm, Patient Position: Sitting, Cuff Size: Large)   Pulse 96   Temp 98.6 F (37 C)   Ht 5\' 4"  (1.626 m)   Wt 206 lb 3.2 oz (93.5 kg)   LMP  (LMP Unknown)   SpO2 97%   BMI 35.39 kg/m   Wt Readings from Last 3 Encounters:  01/11/16 206 lb 3.2 oz (93.5 kg)  12/25/15 208 lb 12.8 oz (94.7 kg)  11/23/15 209 lb 3.2 oz (94.9 kg)    Physical Exam  Constitutional: She is oriented to person, place, and time. She appears well-developed and well-nourished. No distress.  HENT:  Head: Normocephalic and atraumatic.  Right Ear: Tympanic membrane and ear canal normal.  Left Ear: Tympanic membrane and ear canal normal.  Nose: Rhinorrhea present. Right  sinus exhibits no maxillary sinus tenderness and no frontal sinus tenderness. Left sinus exhibits no maxillary sinus tenderness and no frontal sinus tenderness.  Mouth/Throat: Mucous membranes are normal. Posterior oropharyngeal erythema present.  Eyes: Conjunctivae and lids are normal. Right eye exhibits no discharge. Left eye exhibits no discharge. No scleral icterus.  Cardiovascular: Normal rate and regular rhythm.   Pulmonary/Chest: Effort normal and breath sounds normal. No respiratory distress.  Abdominal: Normal appearance. There is no splenomegaly or hepatomegaly.  Musculoskeletal: Normal range of motion.  Neurological: She is alert and oriented to person, place, and time.  Skin: Skin is intact. No rash noted. No pallor.  Psychiatric: She has a normal mood and affect. Her behavior is normal. Judgment and thought content normal.    Results for orders placed or performed in visit on 12/25/15  CoaguChek XS/INR Waived  Result Value Ref Range   INR 2.3 (H) 0.9 - 1.1   Prothrombin Time 27.7 sec      Assessment & Plan:   Problem List Items Addressed This Visit    None    Visit Diagnoses    Cough    -  Primary   Relevant Orders   Veritor Flu A/B Waived   Acute nasopharyngitis  Rx for codeine cough meds and Flonase Pt would like an antibiotic but symptoms seem viral.  Pt high risk of antibiotics but on Coumadin  Follow up plan: Return if symptoms worsen or fail to improve.

## 2016-01-19 ENCOUNTER — Other Ambulatory Visit: Payer: Self-pay | Admitting: Unknown Physician Specialty

## 2016-01-25 ENCOUNTER — Encounter: Payer: Self-pay | Admitting: Unknown Physician Specialty

## 2016-01-25 ENCOUNTER — Ambulatory Visit (INDEPENDENT_AMBULATORY_CARE_PROVIDER_SITE_OTHER): Payer: Commercial Managed Care - HMO | Admitting: Unknown Physician Specialty

## 2016-01-25 VITALS — BP 123/73 | HR 80 | Temp 97.3°F | Wt 207.2 lb

## 2016-01-25 DIAGNOSIS — J0100 Acute maxillary sinusitis, unspecified: Secondary | ICD-10-CM | POA: Diagnosis not present

## 2016-01-25 DIAGNOSIS — I480 Paroxysmal atrial fibrillation: Secondary | ICD-10-CM

## 2016-01-25 LAB — COAGUCHEK XS/INR WAIVED
INR: 1.9 — AB (ref 0.9–1.1)
PROTHROMBIN TIME: 22.9 s

## 2016-01-25 MED ORDER — CEFUROXIME AXETIL 250 MG PO TABS: 250.0000 mg | ORAL_TABLET | Freq: Two times a day (BID) | ORAL | 0 refills | Status: DC

## 2016-01-25 NOTE — Progress Notes (Signed)
BP 123/73 (BP Location: Left Arm, Cuff Size: Normal)   Pulse 80   Temp 97.3 F (36.3 C)   Wt 207 lb 3.2 oz (94 kg)   LMP  (LMP Unknown)   SpO2 96%   BMI 35.57 kg/m    Subjective:    Patient ID: Daisy Watkins, female    DOB: 1933/02/03, 82 y.o.   MRN: [id number]  HPI: Daisy Watkins is a 82 y.o. female  Chief Complaint  Patient presents with  . Atrial Fibrillation  . URI    pt states she has had stuffy nose and ear ache for a few weeks now    Coumadin Taking 7 mg 3 days/week and 6 mg the other days. No problems with bruising or blood in urine.  No change for quite some time.   Relevant past medical, surgical, family and social history reviewed and updated as indicated. Interim medical history since our last visit reviewed. Allergies and medications reviewed and updated.  URI   This is a new problem. The current episode started more than 1 month ago. The problem has been unchanged. There has been no fever. Associated symptoms include congestion, coughing, ear pain and sinus pain. Pertinent negatives include no abdominal pain or sore throat. She has tried nothing for the symptoms. The treatment provided mild relief.  Never did get the antibioitic we called in previously  Review of Systems  HENT: Positive for congestion and ear pain. Negative for sore throat.   Respiratory: Positive for cough.   Gastrointestinal: Negative for abdominal pain.    Per HPI unless specifically indicated above     Objective:    BP 123/73 (BP Location: Left Arm, Cuff Size: Normal)   Pulse 80   Temp 97.3 F (36.3 C)   Wt 207 lb 3.2 oz (94 kg)   LMP  (LMP Unknown)   SpO2 96%   BMI 35.57 kg/m   Wt Readings from Last 3 Encounters:  01/25/16 207 lb 3.2 oz (94 kg)  01/11/16 206 lb 3.2 oz (93.5 kg)  12/25/15 208 lb 12.8 oz (94.7 kg)    Physical Exam  Constitutional: She is oriented to person, place, and time. She appears well-developed and well-nourished. No distress.  HENT:  Head:  Normocephalic and atraumatic.  Right Ear: Tympanic membrane and ear canal normal.  Left Ear: Tympanic membrane and ear canal normal.  Nose: No rhinorrhea. Right sinus exhibits maxillary sinus tenderness. Right sinus exhibits no frontal sinus tenderness. Left sinus exhibits maxillary sinus tenderness. Left sinus exhibits no frontal sinus tenderness.  Eyes: Conjunctivae and lids are normal. Right eye exhibits no discharge. Left eye exhibits no discharge. No scleral icterus.  Cardiovascular: Normal rate and regular rhythm.   Pulmonary/Chest: Effort normal and breath sounds normal. No respiratory distress.  Abdominal: Normal appearance. There is no splenomegaly or hepatomegaly.  Musculoskeletal: Normal range of motion.  Neurological: She is alert and oriented to person, place, and time.  Skin: Skin is intact. No rash noted. No pallor.  Psychiatric: She has a normal mood and affect. Her behavior is normal. Judgment and thought content normal.    Results for orders placed or performed in visit on 01/11/16  Veritor Flu A/B Waived  Result Value Ref Range   Influenza A Negative Negative   Influenza B Negative Negative      Assessment & Plan:   Problem List Items Addressed This Visit      Unprioritized   Paroxysmal atrial fibrillation ([place]) - Primary  Relevant Orders   CoaguChek XS/INR Waived    Other Visit Diagnoses    Acute non-recurrent maxillary sinusitis       rx for Ceftin 250 BID    Relevant Medications   cefUROXime (CEFTIN) 250 MG tablet       Follow up plan: Return in about 4 weeks (around 02/22/2016) for chronic illness visit.  Need 30 minutes.

## 2016-02-11 ENCOUNTER — Telehealth: Payer: Self-pay

## 2016-02-11 MED ORDER — LORAZEPAM 1 MG PO TABS: 1.0000 mg | ORAL_TABLET | Freq: Every day | ORAL | 0 refills | Status: DC

## 2016-02-11 NOTE — Telephone Encounter (Signed)
Pharmacy requests refill on lorazepam.

## 2016-02-20 ENCOUNTER — Encounter: Payer: Self-pay | Admitting: Unknown Physician Specialty

## 2016-02-20 ENCOUNTER — Ambulatory Visit (INDEPENDENT_AMBULATORY_CARE_PROVIDER_SITE_OTHER): Payer: Commercial Managed Care - HMO | Admitting: Unknown Physician Specialty

## 2016-02-20 VITALS — BP 146/76 | HR 94 | Temp 98.0°F | Ht 63.2 in | Wt 208.2 lb

## 2016-02-20 DIAGNOSIS — L723 Sebaceous cyst: Secondary | ICD-10-CM | POA: Diagnosis not present

## 2016-02-20 DIAGNOSIS — I480 Paroxysmal atrial fibrillation: Secondary | ICD-10-CM | POA: Diagnosis not present

## 2016-02-20 DIAGNOSIS — I10 Essential (primary) hypertension: Secondary | ICD-10-CM

## 2016-02-20 DIAGNOSIS — Z794 Long term (current) use of insulin: Secondary | ICD-10-CM

## 2016-02-20 DIAGNOSIS — E1165 Type 2 diabetes mellitus with hyperglycemia: Secondary | ICD-10-CM

## 2016-02-20 DIAGNOSIS — E7800 Pure hypercholesterolemia, unspecified: Secondary | ICD-10-CM

## 2016-02-20 DIAGNOSIS — IMO0001 Reserved for inherently not codable concepts without codable children: Secondary | ICD-10-CM

## 2016-02-20 MED ORDER — CLINDAMYCIN HCL 300 MG PO CAPS: 300.0000 mg | ORAL_CAPSULE | Freq: Three times a day (TID) | ORAL | 0 refills | Status: DC

## 2016-02-20 MED ORDER — METFORMIN HCL 500 MG PO TABS: 250.0000 mg | ORAL_TABLET | Freq: Three times a day (TID) | ORAL | 1 refills | Status: DC

## 2016-02-20 NOTE — Assessment & Plan Note (Signed)
Hgb A1C is 8.7.  States she will try Metformin again and take 1/2 pill with meals.

## 2016-02-20 NOTE — Progress Notes (Signed)
BP (!) 146/76 (BP Location: Left Arm, Patient Position: Sitting, Cuff Size: Large)   Pulse 94   Temp 98 F (36.7 C)   Ht 5' 3.2" (1.605 m) Comment: pt had shoes on  Wt 208 lb 3.2 oz (94.4 kg) Comment: pt had shoes on  LMP  (LMP Unknown)   SpO2 98%   BMI 36.65 kg/m    Subjective:    Patient ID: Daisy Watkins, female    DOB: 14-Nov-1932, 82 y.o.   MRN: [id number]  HPI: Daisy Watkins is a 82 y.o. female  Chief Complaint  Patient presents with  . Diabetes  . Hypertension  . Atrial Fibrillation  . Cyst    pt states she has a cyst on her back that has been there for a while, ever since she had shingles, but has gotten worse over the last week   Cyst Pt with infected and painful cyst. This has been getting worse for over 1 week now.    Diabetes: Using medications without difficulties.  Taking 35 u of insulin in the evening and 45 in the AM No hypoglycemic episodes No hyperglycemic episodes Feet problems: Blood Sugars averaging:94 at times 234 after eating.  So a large range.  96-136 in the AM eye exam within last year Last Hgb A1C:  Hypertension  Using medications without difficulty Average home BPs   Using medication without problems or lightheadedness No chest pain with exertion or shortness of breath No Edema  Elevated Cholesterol Using medications without problems No Muscle aches  Diet: Tries to eat right Exercise:walks  Relevant past medical, surgical, family and social history reviewed and updated as indicated. Interim medical history since our last visit reviewed. Allergies and medications reviewed and updated.  Review of Systems  Per HPI unless specifically indicated above     Objective:    BP (!) 146/76 (BP Location: Left Arm, Patient Position: Sitting, Cuff Size: Large)   Pulse 94   Temp 98 F (36.7 C)   Ht 5' 3.2" (1.605 m) Comment: pt had shoes on  Wt 208 lb 3.2 oz (94.4 kg) Comment: pt had shoes on  LMP  (LMP Unknown)   SpO2 98%   BMI 36.65  kg/m   Wt Readings from Last 3 Encounters:  02/20/16 208 lb 3.2 oz (94.4 kg)  01/25/16 207 lb 3.2 oz (94 kg)  01/11/16 206 lb 3.2 oz (93.5 kg)    Physical Exam  Constitutional: She is oriented to person, place, and time. She appears well-developed and well-nourished. No distress.  HENT:  Head: Normocephalic and atraumatic.  Eyes: Conjunctivae and lids are normal. Right eye exhibits no discharge. Left eye exhibits no discharge. No scleral icterus.  Neck: Normal range of motion. Neck supple. No JVD present. Carotid bruit is not present.  Cardiovascular: Normal rate, regular rhythm and normal heart sounds.   Pulmonary/Chest: Effort normal and breath sounds normal.  Abdominal: Normal appearance. There is no splenomegaly or hepatomegaly.  Musculoskeletal: Normal range of motion.  Neurological: She is alert and oriented to person, place, and time.  Skin: Skin is warm, dry and intact. No rash noted. No pallor.  Large sebaceous infected cyst with surrounding erythema Cyst cleaned with betadine and alcohol.  Incision made with #11 blade.  Able to express a large amount of pustular material.  Pt ed on infection and to RTC with any drainage or erythema.   Psychiatric: She has a normal mood and affect. Her behavior is normal. Judgment and thought content  normal.    Results for orders placed or performed in visit on 01/25/16  CoaguChek XS/INR Waived  Result Value Ref Range   INR 1.9 (H) 0.9 - 1.1   Prothrombin Time 22.9 sec      Assessment & Plan:   Problem List Items Addressed This Visit      Unprioritized   Benign hypertension (Chronic)    High today but in lots of pain at the time of taking her BP due to infected cyst      Relevant Orders   Comprehensive metabolic panel   Diabetes mellitus type 2, uncontrolled, without complications ([facility]) (Chronic)    Hgb A1C is 8.7.  States she will try Metformin again and take 1/2 pill with meals.        Relevant Medications   metFORMIN  (GLUCOPHAGE) 500 MG tablet   Other Relevant Orders   Bayer DCA Hb A1c Waived   Hyperlipidemia (Chronic)   Relevant Orders   Lipid Panel w/o Chol/HDL Ratio   Paroxysmal atrial fibrillation (HCC) - Primary    INR 2.3 and at goal      Relevant Orders   CoaguChek XS/INR Waived   Sebaceous cyst    I&D.  Rx for Clindamyacin      Relevant Medications   clindamycin (CLEOCIN) 300 MG capsule   Other Relevant Orders   WOUND CULTURE       Follow up plan: Return in about 4 weeks (around 03/19/2016) for INR and DM and Htn.

## 2016-02-20 NOTE — Assessment & Plan Note (Signed)
I&D.  Rx for Clindamyacin

## 2016-02-20 NOTE — Assessment & Plan Note (Signed)
High today but in lots of pain at the time of taking her BP due to infected cyst

## 2016-02-20 NOTE — Assessment & Plan Note (Signed)
INR 2.3 and at goal

## 2016-02-21 LAB — BAYER DCA HB A1C WAIVED: HB A1C: 8.7 % — AB (ref <7.0)

## 2016-02-21 LAB — COMPREHENSIVE METABOLIC PANEL
ALK PHOS: 120 IU/L — AB (ref 39–117)
ALT: 23 IU/L (ref 0–32)
AST: 22 IU/L (ref 0–40)
Albumin/Globulin Ratio: 2.1 (ref 1.2–2.2)
Albumin: 4.2 g/dL (ref 3.5–4.7)
BUN/Creatinine Ratio: 14 (ref 12–28)
BUN: 10 mg/dL (ref 8–27)
Bilirubin Total: 0.4 mg/dL (ref 0.0–1.2)
CALCIUM: 9.4 mg/dL (ref 8.7–10.3)
CO2: 25 mmol/L (ref 18–29)
CREATININE: 0.69 mg/dL (ref 0.57–1.00)
Chloride: 103 mmol/L (ref 96–106)
GFR calc Af Amer: 94 mL/min/{1.73_m2} (ref >59)
GFR calc non Af Amer: 82 mL/min/{1.73_m2} (ref >59)
GLOBULIN, TOTAL: 2 g/dL (ref 1.5–4.5)
GLUCOSE: 196 mg/dL — AB (ref 65–99)
Potassium: 4.7 mmol/L (ref 3.5–5.2)
SODIUM: 143 mmol/L (ref 134–144)
Total Protein: 6.2 g/dL (ref 6.0–8.5)

## 2016-02-21 LAB — LIPID PANEL W/O CHOL/HDL RATIO
CHOLESTEROL TOTAL: 224 mg/dL — AB (ref 100–199)
HDL: 54 mg/dL (ref >39)
LDL Calculated: 126 mg/dL — ABNORMAL HIGH (ref 0–99)
TRIGLYCERIDES: 218 mg/dL — AB (ref 0–149)
VLDL CHOLESTEROL CAL: 44 mg/dL — AB (ref 5–40)

## 2016-02-21 LAB — COAGUCHEK XS/INR WAIVED
INR: 2.3 — AB (ref 0.9–1.1)
PROTHROMBIN TIME: 28 s

## 2016-02-22 LAB — WOUND CULTURE: ORGANISM ID, BACTERIA: NONE SEEN

## 2016-02-25 ENCOUNTER — Other Ambulatory Visit: Payer: Self-pay | Admitting: Unknown Physician Specialty

## 2016-03-03 ENCOUNTER — Ambulatory Visit (INDEPENDENT_AMBULATORY_CARE_PROVIDER_SITE_OTHER): Payer: Commercial Managed Care - HMO | Admitting: Family Medicine

## 2016-03-03 ENCOUNTER — Encounter: Payer: Self-pay | Admitting: Family Medicine

## 2016-03-03 VITALS — BP 177/73 | HR 66 | Temp 97.5°F | Wt 208.0 lb

## 2016-03-03 DIAGNOSIS — S0502XA Injury of conjunctiva and corneal abrasion without foreign body, left eye, initial encounter: Secondary | ICD-10-CM | POA: Diagnosis not present

## 2016-03-03 MED ORDER — ERYTHROMYCIN 5 MG/GM OP OINT: 1.0000 "application " | TOPICAL_OINTMENT | Freq: Every day | OPHTHALMIC | 0 refills | Status: DC

## 2016-03-03 NOTE — Progress Notes (Signed)
   BP (!) 177/73   Pulse 66   Temp 97.5 F (36.4 C)   Wt 208 lb (94.3 kg)   LMP  (LMP Unknown)   SpO2 95%   BMI 36.61 kg/m    Subjective:    Patient ID: Daisy Watkins, female    DOB: 1932/09/05, 82 y.o.   MRN: [id number]  HPI: MARCELENE DUANE is a 82 y.o. female  Chief Complaint  Patient presents with  . Eye Pain    left eye x 1 day. Red, painful, draining, hurts to open her eye. Feels like something is in it.   Patient presents with 1 day history of red, painful, draining left eye. States she feels like there is something in it but has flushed it out with saline drops with no relief. Vision is intact, and minimal photophobia. No N/V, HA, dizziness. No hx of eye issues. Does not wear contacts, wears glasses daily.  Relevant past medical, surgical, family and social history reviewed and updated as indicated. Interim medical history since our last visit reviewed. Allergies and medications reviewed and updated.  Review of Systems  Constitutional: Negative.   HENT: Negative.   Eyes: Positive for photophobia, pain, discharge and redness.  Respiratory: Negative.   Cardiovascular: Negative.   Gastrointestinal: Negative.   Genitourinary: Negative.   Musculoskeletal: Negative.   Skin: Negative.   Neurological: Negative.   Psychiatric/Behavioral: Negative.     Per HPI unless specifically indicated above     Objective:    BP (!) 177/73   Pulse 66   Temp 97.5 F (36.4 C)   Wt 208 lb (94.3 kg)   LMP  (LMP Unknown)   SpO2 95%   BMI 36.61 kg/m   Wt Readings from Last 3 Encounters:  03/03/16 208 lb (94.3 kg)  02/20/16 208 lb 3.2 oz (94.4 kg)  01/25/16 207 lb 3.2 oz (94 kg)    Physical Exam  Constitutional: She is oriented to person, place, and time. She appears well-developed and well-nourished. No distress.  HENT:  Head: Atraumatic.  Left Ear: External ear normal.  Nose: Nose normal.  Eyes: EOM are normal. Pupils are equal, round, and reactive to light. Left eye exhibits  discharge (clear discharge present).  Conjunctiva injected, left eye  Neck: Normal range of motion. Neck supple.  Cardiovascular: Normal rate.   Pulmonary/Chest: No respiratory distress.  Musculoskeletal: Normal range of motion.  Neurological: She is alert and oriented to person, place, and time.  Skin: Skin is warm and dry.  Psychiatric: She has a normal mood and affect. Her behavior is normal.  Nursing note and vitals reviewed.   Visual acuity:  20/50 for right eye ; 20/70 for left eye - with glasses  Fluorescein stain reveals left inferior eye abrasion.       Assessment & Plan:   Problem List Items Addressed This Visit    None    Visit Diagnoses    Abrasion of left cornea, initial encounter    -  Primary   Will treat with erythromycin ointment. Follow up if worsening or no improvement       Follow up plan: Return if symptoms worsen or fail to improve.

## 2016-03-07 ENCOUNTER — Other Ambulatory Visit: Payer: Self-pay | Admitting: Unknown Physician Specialty

## 2016-03-07 NOTE — Telephone Encounter (Signed)
Your patient 

## 2016-03-11 ENCOUNTER — Other Ambulatory Visit: Payer: Self-pay | Admitting: Unknown Physician Specialty

## 2016-03-19 ENCOUNTER — Other Ambulatory Visit: Payer: Self-pay | Admitting: Unknown Physician Specialty

## 2016-03-19 ENCOUNTER — Ambulatory Visit: Payer: Commercial Managed Care - HMO | Admitting: Unknown Physician Specialty

## 2016-03-28 ENCOUNTER — Ambulatory Visit: Payer: Commercial Managed Care - HMO | Admitting: Unknown Physician Specialty

## 2016-04-02 ENCOUNTER — Encounter: Payer: Self-pay | Admitting: Unknown Physician Specialty

## 2016-04-02 ENCOUNTER — Ambulatory Visit (INDEPENDENT_AMBULATORY_CARE_PROVIDER_SITE_OTHER): Payer: Medicare HMO | Admitting: Unknown Physician Specialty

## 2016-04-02 VITALS — BP 148/71 | HR 86 | Temp 97.4°F | Wt 208.0 lb

## 2016-04-02 DIAGNOSIS — E1165 Type 2 diabetes mellitus with hyperglycemia: Secondary | ICD-10-CM | POA: Diagnosis not present

## 2016-04-02 DIAGNOSIS — IMO0001 Reserved for inherently not codable concepts without codable children: Secondary | ICD-10-CM

## 2016-04-02 DIAGNOSIS — I480 Paroxysmal atrial fibrillation: Secondary | ICD-10-CM | POA: Diagnosis not present

## 2016-04-02 DIAGNOSIS — Z794 Long term (current) use of insulin: Secondary | ICD-10-CM

## 2016-04-02 LAB — COAGUCHEK XS/INR WAIVED
INR: 2.4 — ABNORMAL HIGH (ref 0.9–1.1)
Prothrombin Time: 28.4 s

## 2016-04-02 NOTE — Assessment & Plan Note (Signed)
Intolerant to Metformin.  Needs affordable options.  Will check Hgb A1C next month

## 2016-04-02 NOTE — Assessment & Plan Note (Signed)
INR is 2.4.  Continue present coumadin dose

## 2016-04-02 NOTE — Progress Notes (Signed)
BP (!) 148/71 (BP Location: Left Arm, Cuff Size: Large)   Pulse 86   Temp 97.4 F (36.3 C)   Wt 208 lb (94.3 kg)   LMP  (LMP Unknown)   SpO2 97%   BMI 36.61 kg/m    Subjective:    Patient ID: Daisy Watkins, female    DOB: June 01, 1932, 83 y.o.   MRN: [id number]  HPI: Daisy Watkins is a 82 y.o. female  Chief Complaint  Patient presents with  . Atrial Fibrillation    pt states she takes 7 mg of warfarin on M, W and F and 6 mg of warfarin on Tu, Th, Sa, and Su.   Marland Kitchen Hypertension    4 week f/up  . Diabetes    pt states she is schedueling eye exam soon   Coumadin Taking 7 mg 3 days/week and 6 mg the other days. No problems with bruising or blood in urine. No change for quite some time.  Relevant past medical, surgical, family and social history reviewed and updated as indicated. Interim medical history since our last visit reviewed. Allergies and medications reviewed and updated.  Diabetes BS runs from 144-200 and unable to tolerate Metformin that we started last visit.  No change in insulin 70/30 which is 35 u in the evening and 45 in the morning.    Relevant past medical, surgical, family and social history reviewed and updated as indicated. Interim medical history since our last visit reviewed. Allergies and medications reviewed and updated.  Review of Systems  Per HPI unless specifically indicated above     Objective:    BP (!) 148/71 (BP Location: Left Arm, Cuff Size: Large)   Pulse 86   Temp 97.4 F (36.3 C)   Wt 208 lb (94.3 kg)   LMP  (LMP Unknown)   SpO2 97%   BMI 36.61 kg/m   Wt Readings from Last 3 Encounters:  04/02/16 208 lb (94.3 kg)  03/03/16 208 lb (94.3 kg)  02/20/16 208 lb 3.2 oz (94.4 kg)    Physical Exam  Constitutional: She is oriented to person, place, and time. She appears well-developed and well-nourished. No distress.  HENT:  Head: Normocephalic and atraumatic.  Eyes: Conjunctivae and lids are normal. Right eye exhibits no discharge.  Left eye exhibits no discharge. No scleral icterus.  Neck: Normal range of motion. Neck supple. No JVD present. Carotid bruit is not present.  Pulmonary/Chest: Effort normal.  Abdominal: Normal appearance. There is no splenomegaly or hepatomegaly.  Musculoskeletal: Normal range of motion.  Neurological: She is alert and oriented to person, place, and time.  Skin: Skin is warm, dry and intact. No rash noted. No pallor.  Psychiatric: She has a normal mood and affect. Her behavior is normal. Judgment and thought content normal.    Results for orders placed or performed in visit on 02/20/16  WOUND CULTURE  Result Value Ref Range   Gram Stain Result Final report    Result 1 Comment    RESULT 2 No organisms seen    Aerobic Bacterial Culture Final report    Result 1 Comment   CoaguChek XS/INR Waived  Result Value Ref Range   INR 2.3 (H) 0.9 - 1.1   Prothrombin Time 28.0 sec  Comprehensive metabolic panel  Result Value Ref Range   Glucose 196 (H) 65 - 99 mg/dL   BUN 10 8 - 27 mg/dL   Creatinine, Ser 0.69 0.57 - 1.00 mg/dL   GFR calc non Af  Amer 82 >59 mL/min/1.73   GFR calc Af Amer 94 >59 mL/min/1.73   BUN/Creatinine Ratio 14 12 - 28   Sodium 143 134 - 144 mmol/L   Potassium 4.7 3.5 - 5.2 mmol/L   Chloride 103 96 - 106 mmol/L   CO2 25 18 - 29 mmol/L   Calcium 9.4 8.7 - 10.3 mg/dL   Total Protein 6.2 6.0 - 8.5 g/dL   Albumin 4.2 3.5 - 4.7 g/dL   Globulin, Total 2.0 1.5 - 4.5 g/dL   Albumin/Globulin Ratio 2.1 1.2 - 2.2   Bilirubin Total 0.4 0.0 - 1.2 mg/dL   Alkaline Phosphatase 120 (H) 39 - 117 IU/L   AST 22 0 - 40 IU/L   ALT 23 0 - 32 IU/L  Bayer DCA Hb A1c Waived  Result Value Ref Range   Bayer DCA Hb A1c Waived 8.7 (H) <7.0 %  Lipid Panel w/o Chol/HDL Ratio  Result Value Ref Range   Cholesterol, Total 224 (H) 100 - 199 mg/dL   Triglycerides 218 (H) 0 - 149 mg/dL   HDL 54 >39 mg/dL   VLDL Cholesterol Cal 44 (H) 5 - 40 mg/dL   LDL Calculated 126 (H) 0 - 99 mg/dL        Assessment & Plan:   Problem List Items Addressed This Visit      Unprioritized   Diabetes mellitus type 2, uncontrolled, without complications ([facility]) (Chronic)    Intolerant to Metformin.  Needs affordable options.  Will check Hgb A1C next month      Paroxysmal atrial fibrillation ([facility]) - Primary    INR is 2.4.  Continue present coumadin dose      Relevant Orders   CoaguChek XS/INR Waived       Follow up plan: Return in about 4 weeks (around 04/30/2016) for DM and INR.

## 2016-04-30 ENCOUNTER — Ambulatory Visit: Payer: Commercial Managed Care - HMO | Admitting: Unknown Physician Specialty

## 2016-05-02 ENCOUNTER — Other Ambulatory Visit: Payer: Self-pay | Admitting: Unknown Physician Specialty

## 2016-05-06 ENCOUNTER — Ambulatory Visit (INDEPENDENT_AMBULATORY_CARE_PROVIDER_SITE_OTHER): Payer: Medicare HMO | Admitting: Unknown Physician Specialty

## 2016-05-06 ENCOUNTER — Encounter: Payer: Self-pay | Admitting: Unknown Physician Specialty

## 2016-05-06 VITALS — BP 150/80 | HR 87 | Temp 97.6°F | Wt 208.0 lb

## 2016-05-06 DIAGNOSIS — I480 Paroxysmal atrial fibrillation: Secondary | ICD-10-CM

## 2016-05-06 DIAGNOSIS — IMO0001 Reserved for inherently not codable concepts without codable children: Secondary | ICD-10-CM

## 2016-05-06 DIAGNOSIS — L723 Sebaceous cyst: Secondary | ICD-10-CM

## 2016-05-06 DIAGNOSIS — Z7901 Long term (current) use of anticoagulants: Secondary | ICD-10-CM | POA: Diagnosis not present

## 2016-05-06 DIAGNOSIS — Z794 Long term (current) use of insulin: Secondary | ICD-10-CM | POA: Diagnosis not present

## 2016-05-06 DIAGNOSIS — Z5181 Encounter for therapeutic drug level monitoring: Secondary | ICD-10-CM

## 2016-05-06 DIAGNOSIS — E1165 Type 2 diabetes mellitus with hyperglycemia: Secondary | ICD-10-CM | POA: Diagnosis not present

## 2016-05-06 LAB — BAYER DCA HB A1C WAIVED: HB A1C: 8 % — AB (ref <7.0)

## 2016-05-06 LAB — COAGUCHEK XS/INR WAIVED
INR: 2.1 — AB (ref 0.9–1.1)
Prothrombin Time: 25 s

## 2016-05-06 MED ORDER — DOXYCYCLINE HYCLATE 100 MG PO TABS: 100.0000 mg | ORAL_TABLET | Freq: Two times a day (BID) | ORAL | 0 refills | Status: DC

## 2016-05-06 NOTE — Assessment & Plan Note (Signed)
Rx for Doxycycline.  Consider second opinion from surgery

## 2016-05-06 NOTE — Assessment & Plan Note (Signed)
INR 2.1 and at goal.  Continue present dose.

## 2016-05-06 NOTE — Progress Notes (Signed)
BP (!) 150/80 (BP Location: Left Arm, Cuff Size: Normal)   Pulse 87   Temp 97.6 F (36.4 C)   Wt 208 lb (94.3 kg)   LMP  (LMP Unknown)   SpO2 97%   BMI 36.61 kg/m    Subjective:    Patient ID: Daisy Watkins, female    DOB: 27-Oct-1932, 83 y.o.   MRN: [id number]  HPI: Daisy Watkins is a 83 y.o. female  Chief Complaint  Patient presents with  . Atrial Fibrillation    pt statets she takes 7 mg of warfarin 3 days per week, 6 mg of warfarin the other 4 days  . Diabetes    pt states she is going to get eye exam in the next month or so, states she just found her glasses   . Cyst    pt states that the cyst on her back came back and burst, states she believes it is infected now because it is red   Coumadin Taking 7 mg 3 days/week and 6 mg the other days. No problems with bruising or blood in urine or stool. No change for quite some time.   Diabetes BS runs from 144-200 and unable to tolerate Metformin that we started last visit.  No change in insulin 70/30 which is 30 u in the evening and 45 in the morning.   Last Hgb A1C was 8.8.  Blood sugar is around 136  Cyst We lanced her sebaceous cyst on back about 6 months ago.  It burst on it's own and now red.  She has consulted with surgery in the past but they did not want to treat it.     Hypertension  High today and feels it is always high while here.  States at home it is 127/70.  Her back is bothering her today.  No swelling or SOB.    Constipation Takes Miralax prn and wants to know if she can "keep on taking it"  Relevant past medical, surgical, family and social history reviewed and updated as indicated. Interim medical history since our last visit reviewed. Allergies and medications reviewed and updated.   Review of Systems  Per HPI unless specifically indicated above     Objective:    BP (!) 150/80 (BP Location: Left Arm, Cuff Size: Normal)   Pulse 87   Temp 97.6 F (36.4 C)   Wt 208 lb (94.3 kg)   LMP  (LMP  Unknown)   SpO2 97%   BMI 36.61 kg/m   Wt Readings from Last 3 Encounters:  05/06/16 208 lb (94.3 kg)  04/02/16 208 lb (94.3 kg)  03/03/16 208 lb (94.3 kg)    Physical Exam  Constitutional: She is oriented to person, place, and time. She appears well-developed and well-nourished. No distress.  HENT:  Head: Normocephalic and atraumatic.  Eyes: Conjunctivae and lids are normal. Right eye exhibits no discharge. Left eye exhibits no discharge. No scleral icterus.  Neck: Normal range of motion. Neck supple. No JVD present. Carotid bruit is not present.  Cardiovascular: Normal rate, regular rhythm and normal heart sounds.   Pulmonary/Chest: Effort normal and breath sounds normal.  Abdominal: Normal appearance. There is no splenomegaly or hepatomegaly.  Musculoskeletal: Normal range of motion.  Neurological: She is alert and oriented to person, place, and time.  Skin: Skin is warm, dry and intact. No rash noted. No pallor.  Sebaceous cyst upper part of back draining but erythemetous and tender.    Psychiatric: She has  a normal mood and affect. Her behavior is normal. Judgment and thought content normal.    Results for orders placed or performed in visit on 04/02/16  CoaguChek XS/INR Waived  Result Value Ref Range   INR 2.4 (H) 0.9 - 1.1   Prothrombin Time 28.4 sec      Assessment & Plan:   Problem List Items Addressed This Visit      Unprioritized   Diabetes mellitus type 2, uncontrolled, without complications ([place]) - Primary (Chronic)    Improved from 8.8 to 8.0.  Increase evening insulin to 35 units.        Relevant Orders   Comprehensive metabolic panel   Bayer DCA Hb A1c Waived   Monitoring for anticoagulant use (Chronic)    INR 2.1 and at goal.  Continue present dose.        Paroxysmal atrial fibrillation (HCC)   Relevant Orders   CoaguChek XS/INR Waived   Sebaceous cyst    Rx for Doxycycline.  Consider second opinion from surgery          Follow up  plan: Return in about 4 weeks (around 06/03/2016).

## 2016-05-06 NOTE — Assessment & Plan Note (Signed)
Improved from 8.8 to 8.0.  Increase evening insulin to 35 units.

## 2016-05-07 LAB — COMPREHENSIVE METABOLIC PANEL
A/G RATIO: 1.9 (ref 1.2–2.2)
ALBUMIN: 4.3 g/dL (ref 3.5–4.7)
ALK PHOS: 126 IU/L — AB (ref 39–117)
ALT: 23 IU/L (ref 0–32)
AST: 21 IU/L (ref 0–40)
BILIRUBIN TOTAL: 0.5 mg/dL (ref 0.0–1.2)
BUN / CREAT RATIO: 14 (ref 12–28)
BUN: 11 mg/dL (ref 8–27)
CHLORIDE: 97 mmol/L (ref 96–106)
CO2: 24 mmol/L (ref 18–29)
CREATININE: 0.8 mg/dL (ref 0.57–1.00)
Calcium: 10.2 mg/dL (ref 8.7–10.3)
GFR calc Af Amer: 80 mL/min/{1.73_m2} (ref >59)
GFR calc non Af Amer: 69 mL/min/{1.73_m2} (ref >59)
GLOBULIN, TOTAL: 2.3 g/dL (ref 1.5–4.5)
Glucose: 283 mg/dL — ABNORMAL HIGH (ref 65–99)
POTASSIUM: 5.4 mmol/L — AB (ref 3.5–5.2)
SODIUM: 137 mmol/L (ref 134–144)
Total Protein: 6.6 g/dL (ref 6.0–8.5)

## 2016-05-21 ENCOUNTER — Ambulatory Visit (INDEPENDENT_AMBULATORY_CARE_PROVIDER_SITE_OTHER): Payer: Medicare HMO | Admitting: Family Medicine

## 2016-05-21 ENCOUNTER — Encounter: Payer: Self-pay | Admitting: Family Medicine

## 2016-05-21 VITALS — BP 154/72 | HR 76 | Temp 98.4°F | Resp 17 | Ht 63.2 in | Wt 210.0 lb

## 2016-05-21 DIAGNOSIS — B373 Candidiasis of vulva and vagina: Secondary | ICD-10-CM | POA: Diagnosis not present

## 2016-05-21 DIAGNOSIS — R300 Dysuria: Secondary | ICD-10-CM | POA: Diagnosis not present

## 2016-05-21 DIAGNOSIS — N760 Acute vaginitis: Secondary | ICD-10-CM | POA: Diagnosis not present

## 2016-05-21 DIAGNOSIS — B3731 Acute candidiasis of vulva and vagina: Secondary | ICD-10-CM

## 2016-05-21 DIAGNOSIS — B9689 Other specified bacterial agents as the cause of diseases classified elsewhere: Secondary | ICD-10-CM

## 2016-05-21 DIAGNOSIS — I480 Paroxysmal atrial fibrillation: Secondary | ICD-10-CM | POA: Diagnosis not present

## 2016-05-21 LAB — UA/M W/RFLX CULTURE, ROUTINE
BILIRUBIN UA: NEGATIVE
KETONES UA: NEGATIVE
LEUKOCYTES UA: NEGATIVE
NITRITE UA: NEGATIVE
Protein, UA: NEGATIVE
RBC UA: NEGATIVE
SPEC GRAV UA: 1.015 (ref 1.005–1.030)
Urobilinogen, Ur: 0.2 mg/dL (ref 0.2–1.0)
pH, UA: 5 (ref 5.0–7.5)

## 2016-05-21 LAB — MICROSCOPIC EXAMINATION
RBC, UA: NONE SEEN /hpf (ref 0–?)
WBC, UA: NONE SEEN /hpf (ref 0–?)

## 2016-05-21 LAB — WET PREP FOR TRICH, YEAST, CLUE
CLUE CELL EXAM: POSITIVE — AB
Trichomonas Exam: NEGATIVE
Yeast Exam: POSITIVE — AB

## 2016-05-21 MED ORDER — METRONIDAZOLE 500 MG PO TABS
500.0000 mg | ORAL_TABLET | Freq: Two times a day (BID) | ORAL | 0 refills | Status: DC
Start: 2016-05-21 — End: 2016-06-18

## 2016-05-21 MED ORDER — FLUCONAZOLE 150 MG PO TABS: 150.0000 mg | ORAL_TABLET | Freq: Once | ORAL | 0 refills | Status: AC

## 2016-05-21 NOTE — Assessment & Plan Note (Signed)
Due for INR 3/6, but will have her come back for INR in 1 week to check levels during antibiotic use. Discussed being hypervigilant for any bleeding or bruising difficulties.

## 2016-05-21 NOTE — Patient Instructions (Signed)
Follow up in 1 week for INR

## 2016-05-21 NOTE — Progress Notes (Signed)
BP (!) 154/72 (BP Location: Left Arm, Patient Position: Sitting, Cuff Size: Normal)   Pulse 76   Temp 98.4 F (36.9 C) (Oral)   Resp 17   Ht 5' 3.2" (1.605 m)   Wt 210 lb (95.3 kg)   LMP  (LMP Unknown)   SpO2 99%   BMI 36.96 kg/m    Subjective:    Patient ID: Daisy Watkins, female    DOB: Feb 02, 1933, 83 y.o.   MRN: [id number]  HPI: Daisy Watkins is a 83 y.o. female  Chief Complaint  Patient presents with  . Dysuria    2-3 days  . Vaginal Itching    onset 1 week   2 weeks of vaginal itching and irritation, as well as an odor. Now having 3 days of dysuria as well. Denies discharge. Has tried vaseline with no relief. Denies pelvic pain, back pain, fever, chills, N/V.   Past Medical History:  Diagnosis Date  . Anxiety   . Diabetes mellitus without complication ([place])   . DVT (deep venous thrombosis) ([place]) 2006   chronic anticoagulation  . GERD (gastroesophageal reflux disease)   . Hyperlipidemia   . Hypertension   . Insomnia   . Vitamin B12 deficiency   . Vitamin D deficiency disease    Social History   Social History  . Marital status: Divorced    Spouse name: N/A  . Number of children: N/A  . Years of education: N/A   Occupational History  . Not on file.   Social History Main Topics  . Smoking status: Former Smoker    Packs/day: 0.50    Years: 30.00    Types: Cigarettes    Quit date: 03/31/1977  . Smokeless tobacco: Never Used  . Alcohol use No     Comment: rare  . Drug use: No  . Sexual activity: No   Other Topics Concern  . Not on file   Social History Narrative  . No narrative on file    Relevant past medical, surgical, family and social history reviewed and updated as indicated. Interim medical history since our last visit reviewed. Allergies and medications reviewed and updated.  Review of Systems  Constitutional: Negative.   HENT: Negative.   Eyes: Negative.   Respiratory: Negative.   Cardiovascular: Negative.   Gastrointestinal:  Negative.   Genitourinary:       Vaginal itching  Musculoskeletal: Negative.   Skin: Negative.   Neurological: Negative.   Psychiatric/Behavioral: Negative.     Per HPI unless specifically indicated above     Objective:    BP (!) 154/72 (BP Location: Left Arm, Patient Position: Sitting, Cuff Size: Normal)   Pulse 76   Temp 98.4 F (36.9 C) (Oral)   Resp 17   Ht 5' 3.2" (1.605 m)   Wt 210 lb (95.3 kg)   LMP  (LMP Unknown)   SpO2 99%   BMI 36.96 kg/m   Wt Readings from Last 3 Encounters:  05/21/16 210 lb (95.3 kg)  05/06/16 208 lb (94.3 kg)  04/02/16 208 lb (94.3 kg)    Physical Exam  Constitutional: She is oriented to person, place, and time. She appears well-developed and well-nourished. No distress.  HENT:  Head: Atraumatic.  Eyes: Conjunctivae are normal. Pupils are equal, round, and reactive to light. No scleral icterus.  Neck: Normal range of motion. Neck supple.  Cardiovascular: Normal rate and normal heart sounds.   Pulmonary/Chest: Effort normal and breath sounds normal. No respiratory distress.  Genitourinary:  There is erythema in the vagina. Vaginal discharge found.  Musculoskeletal: Normal range of motion.  Neurological: She is alert and oriented to person, place, and time.  Skin: Skin is warm and dry.  Psychiatric: She has a normal mood and affect. Her behavior is normal.  Nursing note and vitals reviewed.     Assessment & Plan:   Problem List Items Addressed This Visit      Cardiovascular and Mediastinum   Paroxysmal atrial fibrillation (HCC)    Due for INR 3/6, but will have her come back for INR in 1 week to check levels during antibiotic use. Discussed being hypervigilant for any bleeding or bruising difficulties.        Other Visit Diagnoses    BV (bacterial vaginosis)    -  Primary   Flagyl sent, discussed probiotic, eating lots of yogurt, and only using gentle cleansers and warm water for hygiene.    Relevant Medications   fluconazole  (DIFLUCAN) 150 MG tablet   metroNIDAZOLE (FLAGYL) 500 MG tablet   Other Relevant Orders   UA/M w/rflx Culture, Routine (STAT)   WET PREP FOR TRICH, YEAST, CLUE   Vaginal candidiasis       1 tablet of diflucan sent, discussed to use monistat cream OTC if still having some discomfort.    Relevant Medications   fluconazole (DIFLUCAN) 150 MG tablet   metroNIDAZOLE (FLAGYL) 500 MG tablet       Follow up plan: Return in about 1 week (around 05/28/2016) for INR.

## 2016-05-28 ENCOUNTER — Encounter: Payer: Self-pay | Admitting: Family Medicine

## 2016-05-28 ENCOUNTER — Ambulatory Visit: Payer: Medicare HMO | Admitting: Family Medicine

## 2016-05-28 ENCOUNTER — Ambulatory Visit (INDEPENDENT_AMBULATORY_CARE_PROVIDER_SITE_OTHER): Payer: Medicare HMO | Admitting: Family Medicine

## 2016-05-28 VITALS — BP 166/67 | HR 63 | Temp 97.8°F | Wt 208.0 lb

## 2016-05-28 DIAGNOSIS — Z5181 Encounter for therapeutic drug level monitoring: Secondary | ICD-10-CM

## 2016-05-28 DIAGNOSIS — I480 Paroxysmal atrial fibrillation: Secondary | ICD-10-CM | POA: Diagnosis not present

## 2016-05-28 DIAGNOSIS — Z7901 Long term (current) use of anticoagulants: Secondary | ICD-10-CM

## 2016-05-28 LAB — COAGUCHEK XS/INR WAIVED
INR: 2.6 — AB (ref 0.9–1.1)
Prothrombin Time: 30.7 s

## 2016-05-28 NOTE — Progress Notes (Signed)
BP (!) 166/67   Pulse 63   Temp 97.8 F (36.6 C)   Wt 208 lb (94.3 kg)   LMP  (LMP Unknown)   SpO2 97%   BMI 36.61 kg/m    Subjective:    Patient ID: Daisy Watkins, female    DOB: 08/25/1932, 83 y.o.   MRN: [id number]  HPI: Daisy Watkins is a 83 y.o. female  Chief Complaint  Patient presents with  . Anticoagulation    She takes 7mg  on Tu, Thur, Sat, Sun. 6mg  on M,W,F. She has been on antibiotics for 1 week.   Patient presents for 1 week follow up INR as she is on flagyl right now. Taking 7 mg 4 days a week and 6 mg 3 days a week on her coumadin. No bleeding or bruising issues. Diet has been stable. Today is last day of the medication.   Relevant past medical, surgical, family and social history reviewed and updated as indicated. Interim medical history since our last visit reviewed. Allergies and medications reviewed and updated.  Review of Systems  Constitutional: Negative.   HENT: Negative.   Respiratory: Negative.   Cardiovascular: Negative.   Gastrointestinal: Negative.   Genitourinary: Negative.   Musculoskeletal: Negative.   Skin: Negative.   Neurological: Negative.   Psychiatric/Behavioral: Negative.     Per HPI unless specifically indicated above     Objective:    BP (!) 166/67   Pulse 63   Temp 97.8 F (36.6 C)   Wt 208 lb (94.3 kg)   LMP  (LMP Unknown)   SpO2 97%   BMI 36.61 kg/m   Wt Readings from Last 3 Encounters:  05/28/16 208 lb (94.3 kg)  05/21/16 210 lb (95.3 kg)  05/06/16 208 lb (94.3 kg)    Physical Exam  Constitutional: She is oriented to person, place, and time. She appears well-developed and well-nourished.  HENT:  Head: Atraumatic.  Eyes: Conjunctivae are normal. Pupils are equal, round, and reactive to light.  Neck: Normal range of motion. Neck supple.  Cardiovascular: Normal rate, regular rhythm and normal heart sounds.   Pulmonary/Chest: Effort normal. No respiratory distress.  Musculoskeletal: Normal range of motion.    Neurological: She is alert and oriented to person, place, and time.  Skin: Skin is warm and dry.  Psychiatric: She has a normal mood and affect. Her behavior is normal.  Nursing note and vitals reviewed.   Results for orders placed or performed in visit on 05/21/16  WET PREP FOR [place], YEAST, CLUE  Result Value Ref Range   Trichomonas Exam Negative Negative   Yeast Exam Positive (A) Negative   Clue Cell Exam Positive (A) Negative  Microscopic Examination  Result Value Ref Range   WBC, UA None seen 0 - 5 /hpf   RBC, UA None seen 0 - 2 /hpf   Epithelial Cells (non renal) 0-10 0 - 10 /hpf   Bacteria, UA Few (A) None seen/Few  UA/M w/rflx Culture, Routine (STAT)  Result Value Ref Range   Specific Gravity, UA 1.015 1.005 - 1.030   pH, UA 5.0 5.0 - 7.5   Color, UA Yellow Yellow   Appearance Ur Cloudy (A) Clear   Leukocytes, UA Negative Negative   Protein, UA Negative Negative/Trace   Glucose, UA Trace (A) Negative   Ketones, UA Negative Negative   RBC, UA Negative Negative   Bilirubin, UA Negative Negative   Urobilinogen, Ur 0.2 0.2 - 1.0 mg/dL   Nitrite, UA Negative Negative  Microscopic Examination See below:       Assessment & Plan:   Problem List Items Addressed This Visit      Cardiovascular and Mediastinum   Paroxysmal atrial fibrillation (HCC) - Primary    INR today is 2.6. Continue current dose, follow up as scheduled in 2 weeks for repeat INR      Relevant Orders   CoaguChek XS/INR Waived     Other   Monitoring for anticoagulant use (Chronic)   Relevant Orders   CoaguChek XS/INR Waived       Follow up plan: Return for as scheduled.

## 2016-05-28 NOTE — Assessment & Plan Note (Signed)
INR today is 2.6. Continue current dose, follow up as scheduled in 2 weeks for repeat INR

## 2016-05-28 NOTE — Patient Instructions (Signed)
Follow up as scheduled.  

## 2016-06-04 ENCOUNTER — Ambulatory Visit: Payer: Medicare HMO | Admitting: Family Medicine

## 2016-06-06 ENCOUNTER — Ambulatory Visit: Payer: Medicare HMO | Admitting: Family Medicine

## 2016-06-10 ENCOUNTER — Ambulatory Visit: Payer: Medicare HMO | Admitting: Family Medicine

## 2016-06-12 ENCOUNTER — Other Ambulatory Visit: Payer: Self-pay | Admitting: Unknown Physician Specialty

## 2016-06-13 ENCOUNTER — Ambulatory Visit: Payer: Medicare HMO | Admitting: Family Medicine

## 2016-06-13 ENCOUNTER — Telehealth: Payer: Self-pay | Admitting: Family Medicine

## 2016-06-13 ENCOUNTER — Encounter: Payer: Self-pay | Admitting: Family Medicine

## 2016-06-13 ENCOUNTER — Telehealth: Payer: Self-pay

## 2016-06-13 ENCOUNTER — Encounter: Payer: Self-pay | Admitting: Unknown Physician Specialty

## 2016-06-13 MED ORDER — LORAZEPAM 1 MG PO TABS: 1.0000 mg | ORAL_TABLET | Freq: Every evening | ORAL | 0 refills | Status: DC | PRN

## 2016-06-13 MED ORDER — INSULIN NPH ISOPHANE & REGULAR (70-30) 100 UNIT/ML ~~LOC~~ SUSP: SUBCUTANEOUS | 12 refills | Status: DC

## 2016-06-13 NOTE — Telephone Encounter (Signed)
She has had 10 cancellations since 03/19/16 for her Pt/INR. She needs a final warning letter please.

## 2016-06-13 NOTE — Telephone Encounter (Signed)
Discussed multiple cancelled appts for INR check the past week or so - states weather was the reason for some and today she was not feeling well. Plans to call to schedule an OV next week. Also states she needs a refill on insulin and ativan. Both sent to [facility].

## 2016-06-13 NOTE — Telephone Encounter (Signed)
Tried calling pt to discuss multiple cancelled appts, did not leave in VM given generic answering machine. Will try back later.

## 2016-06-13 NOTE — Telephone Encounter (Signed)
Letter mailed

## 2016-06-18 ENCOUNTER — Ambulatory Visit (INDEPENDENT_AMBULATORY_CARE_PROVIDER_SITE_OTHER): Payer: Medicare HMO | Admitting: Family Medicine

## 2016-06-18 ENCOUNTER — Encounter: Payer: Self-pay | Admitting: Family Medicine

## 2016-06-18 VITALS — BP 186/71 | HR 68 | Temp 98.4°F | Wt 207.0 lb

## 2016-06-18 DIAGNOSIS — J069 Acute upper respiratory infection, unspecified: Secondary | ICD-10-CM | POA: Diagnosis not present

## 2016-06-18 DIAGNOSIS — B9789 Other viral agents as the cause of diseases classified elsewhere: Secondary | ICD-10-CM

## 2016-06-18 DIAGNOSIS — I480 Paroxysmal atrial fibrillation: Secondary | ICD-10-CM | POA: Diagnosis not present

## 2016-06-18 LAB — COAGUCHEK XS/INR WAIVED
INR: 1.6 — ABNORMAL HIGH (ref 0.9–1.1)
Prothrombin Time: 19.8 s

## 2016-06-18 MED ORDER — WARFARIN SODIUM 6 MG PO TABS: 6.0000 mg | ORAL_TABLET | Freq: Every day | ORAL | 1 refills | Status: DC

## 2016-06-18 MED ORDER — FLUTICASONE PROPIONATE 50 MCG/ACT NA SUSP: 2.0000 | Freq: Every day | NASAL | 12 refills | Status: DC

## 2016-06-18 MED ORDER — GUAIFENESIN ER 600 MG PO TB12: 600.0000 mg | ORAL_TABLET | Freq: Two times a day (BID) | ORAL | 0 refills | Status: DC

## 2016-06-18 NOTE — Progress Notes (Signed)
BP (!) 186/71   Pulse 68   Temp 98.4 F (36.9 C)   Wt 207 lb (93.9 kg)   LMP  (LMP Unknown)   SpO2 97%   BMI 36.44 kg/m    Subjective:    Patient ID: Daisy Watkins, female    DOB: [birth year], 83 y.o.   MRN: [id number]  HPI: Daisy Watkins is a 83 y.o. female  Chief Complaint  Patient presents with  . Anticoagulation    6mg  on Mon, Wed, Fri and 7mg  on Tu, Thur, Sat, Sun.   Patient presents for 2 week INR recheck. Taking 7 mg M W F, and 6 mg T Th Sa Su. No missed doses. No new medications, bleeding or bruising issues, but does state that she hasn't been eating much the past few weeks - especially vegetables. Previous INR stable at 2.6.   Also c/o sinus pressure and congestion x 2 days. No fever, chills, aches, but is having some HAs off and on. Out of flonase, not currently taking anything for sxs.   Past Medical History:  Diagnosis Date  . Anxiety   . Diabetes mellitus without complication ([place])   . DVT (deep venous thrombosis) ([place]) 2006   chronic anticoagulation  . GERD (gastroesophageal reflux disease)   . Hyperlipidemia   . Hypertension   . Insomnia   . Vitamin B12 deficiency   . Vitamin D deficiency disease    Social History   Social History  . Marital status: Divorced    Spouse name: N/A  . Number of children: N/A  . Years of education: N/A   Occupational History  . Not on file.   Social History Main Topics  . Smoking status: Former Smoker    Packs/day: 0.50    Years: 30.00    Types: Cigarettes    Quit date: 03/31/1977  . Smokeless tobacco: Never Used  . Alcohol use No     Comment: rare  . Drug use: No  . Sexual activity: No   Other Topics Concern  . Not on file   Social History Narrative  . No narrative on file    Relevant past medical, surgical, family and social history reviewed and updated as indicated. Interim medical history since our last visit reviewed. Allergies and medications reviewed and updated.  Review of Systems    Constitutional: Negative.   HENT: Positive for congestion and sinus pressure.   Respiratory: Negative.   Cardiovascular: Negative.   Gastrointestinal: Negative.   Genitourinary: Negative.   Musculoskeletal: Negative.   Neurological: Positive for headaches.  Hematological: Does not bruise/bleed easily.  Psychiatric/Behavioral: Negative.     Per HPI unless specifically indicated above     Objective:    BP (!) 186/71   Pulse 68   Temp 98.4 F (36.9 C)   Wt 207 lb (93.9 kg)   LMP  (LMP Unknown)   SpO2 97%   BMI 36.44 kg/m   Wt Readings from Last 3 Encounters:  06/18/16 207 lb (93.9 kg)  05/28/16 208 lb (94.3 kg)  05/21/16 210 lb (95.3 kg)    Physical Exam  Constitutional: She is oriented to person, place, and time. She appears well-developed and well-nourished. No distress.  HENT:  Head: Atraumatic.  Right Ear: External ear normal.  Left Ear: External ear normal.  Mouth/Throat: Oropharynx is clear and moist. No oropharyngeal exudate.  Rhinorrhea present  Eyes: Conjunctivae are normal. Pupils are equal, round, and reactive to light.  Neck: Normal range of motion.  Neck supple.  Cardiovascular: Normal rate.   Pulmonary/Chest: Effort normal and breath sounds normal. No respiratory distress.  Musculoskeletal: Normal range of motion.  Lymphadenopathy:    She has no cervical adenopathy.  Neurological: She is alert and oriented to person, place, and time.  Skin: Skin is warm and dry.  Psychiatric: She has a normal mood and affect. Her behavior is normal.  Nursing note and vitals reviewed.   Results for orders placed or performed in visit on 05/28/16  CoaguChek XS/INR Waived  Result Value Ref Range   INR 2.6 (H) 0.9 - 1.1   Prothrombin Time 30.7 sec      Assessment & Plan:   Problem List Items Addressed This Visit      Cardiovascular and Mediastinum   Paroxysmal atrial fibrillation (HCC) - Primary    INR today not at goal, went from 2.6 - 1.6 over the last 2  weeks. Encouraged pt to return to baseline diet, especially with regard to greens. Start 7 mg 4 days per week and 6 mg 3 days per week. Recheck in 2 weeks.       Relevant Medications   warfarin (COUMADIN) 6 MG tablet   Other Relevant Orders   CoaguChek XS/INR Waived    Other Visit Diagnoses    Viral URI       Start plain mucinex, zyrtec, sinus rinses. Rest, tylenol prn. F/u if worsening or no improvement       Follow up plan: Return in about 2 weeks (around 07/02/2016) for INR.

## 2016-06-18 NOTE — Patient Instructions (Signed)
4 days per week on 7 mg, 3 days per week on 6 mg

## 2016-06-18 NOTE — Assessment & Plan Note (Signed)
INR today not at goal, went from 2.6 - 1.6 over the last 2 weeks. Encouraged pt to return to baseline diet, especially with regard to greens. Start 7 mg 4 days per week and 6 mg 3 days per week. Recheck in 2 weeks.

## 2016-07-04 ENCOUNTER — Encounter: Payer: Self-pay | Admitting: Unknown Physician Specialty

## 2016-07-04 ENCOUNTER — Ambulatory Visit (INDEPENDENT_AMBULATORY_CARE_PROVIDER_SITE_OTHER): Payer: Medicare HMO | Admitting: Unknown Physician Specialty

## 2016-07-04 VITALS — BP 168/74 | HR 57 | Temp 97.9°F | Wt 206.6 lb

## 2016-07-04 DIAGNOSIS — I480 Paroxysmal atrial fibrillation: Secondary | ICD-10-CM | POA: Diagnosis not present

## 2016-07-04 DIAGNOSIS — Z5181 Encounter for therapeutic drug level monitoring: Secondary | ICD-10-CM | POA: Diagnosis not present

## 2016-07-04 LAB — COAGUCHEK XS/INR WAIVED
INR: 1.5 — ABNORMAL HIGH (ref 0.9–1.1)
Prothrombin Time: 18 s

## 2016-07-04 MED ORDER — WARFARIN SODIUM 1 MG PO TABS: 1.0000 mg | ORAL_TABLET | Freq: Once | ORAL | 0 refills | Status: DC

## 2016-07-04 NOTE — Assessment & Plan Note (Addendum)
Increase Coumadin from 7mg  every day of the week

## 2016-07-04 NOTE — Progress Notes (Signed)
   BP (!) 168/74 (BP Location: Left Arm, Patient Position: Sitting, Cuff Size: Large)   Pulse (!) 57   Temp 97.9 F (36.6 C)   Wt 206 lb 9.6 oz (93.7 kg)   LMP  (LMP Unknown)   SpO2 97%   BMI 36.37 kg/m    Subjective:    Patient ID: Daisy Watkins, female    DOB: 20-Feb-1933, 83 y.o.   MRN: [id number]  HPI: Daisy Watkins is a 83 y.o. female  Chief Complaint  Patient presents with  . Atrial Fibrillation    PT/INR- pt states she is taking 7 mg of warfarin 4 days per week ad 6 mg 3 days per week   Pt is here for a 2 week INR check.  Last INR was 1.6 and Coumadin was increased.  Currently  taking Coumadin as above.  No new bruising or bleeding. Diet without changes. No new medications.        Reviewed past notes from another provider   Relevant past medical, surgical, family and social history reviewed and updated as indicated. Interim medical history since our last visit reviewed. Allergies and medications reviewed and updated.  Review of Systems  Per HPI unless specifically indicated above     Objective:    BP (!) 168/74 (BP Location: Left Arm, Patient Position: Sitting, Cuff Size: Large)   Pulse (!) 57   Temp 97.9 F (36.6 C)   Wt 206 lb 9.6 oz (93.7 kg)   LMP  (LMP Unknown)   SpO2 97%   BMI 36.37 kg/m   Wt Readings from Last 3 Encounters:  07/04/16 206 lb 9.6 oz (93.7 kg)  06/18/16 207 lb (93.9 kg)  05/28/16 208 lb (94.3 kg)    Physical Exam  Constitutional: She is oriented to person, place, and time. She appears well-developed and well-nourished. No distress.  HENT:  Head: Normocephalic and atraumatic.  Eyes: Conjunctivae and lids are normal. Right eye exhibits no discharge. Left eye exhibits no discharge. No scleral icterus.  Neck: Normal range of motion. Neck supple. No JVD present. Carotid bruit is not present.  Cardiovascular: Normal rate, regular rhythm and normal heart sounds.   Pulmonary/Chest: Effort normal and breath sounds normal.  Abdominal: Normal  appearance. There is no splenomegaly or hepatomegaly.  Musculoskeletal: Normal range of motion.  Neurological: She is alert and oriented to person, place, and time.  Skin: Skin is warm, dry and intact. No rash noted. No pallor.  Psychiatric: She has a normal mood and affect. Her behavior is normal. Judgment and thought content normal.    Results for orders placed or performed in visit on 06/18/16  CoaguChek XS/INR Waived  Result Value Ref Range   INR 1.6 (H) 0.9 - 1.1   Prothrombin Time 19.8 sec      Assessment & Plan:   Problem List Items Addressed This Visit      Unprioritized   Medication monitoring encounter    Increase Coumadin from 7mg  every day of the week      Paroxysmal atrial fibrillation ([place]) - Primary   Relevant Medications   warfarin (COUMADIN) 1 MG tablet   Other Relevant Orders   CoaguChek XS/INR Waived      Written instructions given  Follow up plan: Return in about 2 weeks (around 07/18/2016).

## 2016-07-04 NOTE — Patient Instructions (Signed)
Take 7 mg Coumadin every day

## 2016-07-15 ENCOUNTER — Other Ambulatory Visit: Payer: Self-pay | Admitting: Family Medicine

## 2016-07-18 ENCOUNTER — Encounter: Payer: Self-pay | Admitting: Unknown Physician Specialty

## 2016-07-18 ENCOUNTER — Ambulatory Visit (INDEPENDENT_AMBULATORY_CARE_PROVIDER_SITE_OTHER): Payer: Medicare HMO | Admitting: Unknown Physician Specialty

## 2016-07-18 VITALS — BP 142/84 | HR 57 | Temp 97.9°F | Wt 206.4 lb

## 2016-07-18 DIAGNOSIS — J301 Allergic rhinitis due to pollen: Secondary | ICD-10-CM

## 2016-07-18 DIAGNOSIS — Z7901 Long term (current) use of anticoagulants: Secondary | ICD-10-CM | POA: Diagnosis not present

## 2016-07-18 DIAGNOSIS — Z5181 Encounter for therapeutic drug level monitoring: Secondary | ICD-10-CM

## 2016-07-18 DIAGNOSIS — I480 Paroxysmal atrial fibrillation: Secondary | ICD-10-CM | POA: Diagnosis not present

## 2016-07-18 LAB — COAGUCHEK XS/INR WAIVED
INR: 2.1 — AB (ref 0.9–1.1)
Prothrombin Time: 24.8 s

## 2016-07-18 MED ORDER — MONTELUKAST SODIUM 10 MG PO TABS: 10.0000 mg | ORAL_TABLET | Freq: Every day | ORAL | 3 refills | Status: DC

## 2016-07-18 NOTE — Assessment & Plan Note (Addendum)
Worsenting.  Taking Loratadine.  She is afraid to take a nasal spray.  Start Singulair daily.

## 2016-07-18 NOTE — Assessment & Plan Note (Signed)
At goal with INR of 2.1.  Continue present dose

## 2016-07-18 NOTE — Progress Notes (Signed)
BP (!) 142/84   Pulse (!) 57   Temp 97.9 F (36.6 C)   Wt 206 lb 6.4 oz (93.6 kg)   LMP  (LMP Unknown)   SpO2 96%   BMI 36.33 kg/m    Subjective:    Patient ID: Daisy Watkins, female    DOB: [birth year], 83 y.o.   MRN: [id number]  HPI: CRISTIN PENAFLOR is a 83 y.o. female  Chief Complaint  Patient presents with  . Atrial Fibrillation    pt taking 7 mg of warfarin daily   Coumadin Last visit increased her coumadin to 7 mg daily Pt is here for a 1 week INR check.  Last INR was 1.5.     No new bruising or bleeding.  Diet without changes.  No new medications.       Allergic rhinitis Pt having trouble with allergies.  Can't smell and ears itch.  Flonase causes a severe itch.  She is taking a "blue and white pill" (Loratadine?) which is helping "a little bit"    Relevant past medical, surgical, family and social history reviewed and updated as indicated. Interim medical history since our last visit reviewed. Allergies and medications reviewed and updated.  Review of Systems  Per HPI unless specifically indicated above     Objective:    BP (!) 142/84   Pulse (!) 57   Temp 97.9 F (36.6 C)   Wt 206 lb 6.4 oz (93.6 kg)   LMP  (LMP Unknown)   SpO2 96%   BMI 36.33 kg/m   Wt Readings from Last 3 Encounters:  07/18/16 206 lb 6.4 oz (93.6 kg)  07/04/16 206 lb 9.6 oz (93.7 kg)  06/18/16 207 lb (93.9 kg)    Physical Exam  Constitutional: She is oriented to person, place, and time. She appears well-developed and well-nourished. No distress.  HENT:  Head: Normocephalic and atraumatic.  Right Ear: Tympanic membrane and ear canal normal.  Left Ear: Tympanic membrane and ear canal normal.  Nose: Rhinorrhea present. Right sinus exhibits no maxillary sinus tenderness and no frontal sinus tenderness. Left sinus exhibits no maxillary sinus tenderness and no frontal sinus tenderness.  Mouth/Throat: Mucous membranes are normal. Posterior oropharyngeal erythema present.  Eyes:  Conjunctivae and lids are normal. Right eye exhibits no discharge. Left eye exhibits no discharge. No scleral icterus.  Cardiovascular: Normal rate and regular rhythm.   Pulmonary/Chest: Effort normal and breath sounds normal. No respiratory distress.  Abdominal: Normal appearance. There is no splenomegaly or hepatomegaly.  Musculoskeletal: Normal range of motion.  Neurological: She is alert and oriented to person, place, and time.  Skin: Skin is intact. No rash noted. No pallor.  Psychiatric: She has a normal mood and affect. Her behavior is normal. Judgment and thought content normal.    Results for orders placed or performed in visit on 07/04/16  CoaguChek XS/INR Waived  Result Value Ref Range   INR 1.5 (H) 0.9 - 1.1   Prothrombin Time 18.0 sec      Assessment & Plan:   Problem List Items Addressed This Visit      Unprioritized   Allergic rhinitis    Worsenting.  Taking Loratadine.  She is afraid to take a nasal spray.  Start Singulair daily.        Monitoring for anticoagulant use (Chronic)    At goal with INR of 2.1.  Continue present dose      Paroxysmal atrial fibrillation ([place]) - Primary   Relevant Orders  CoaguChek XS/INR Waived       Follow up plan: Return in about 4 weeks (around 08/15/2016).

## 2016-08-13 ENCOUNTER — Other Ambulatory Visit: Payer: Self-pay | Admitting: Unknown Physician Specialty

## 2016-08-15 ENCOUNTER — Ambulatory Visit (INDEPENDENT_AMBULATORY_CARE_PROVIDER_SITE_OTHER): Payer: Medicare HMO | Admitting: Family Medicine

## 2016-08-15 ENCOUNTER — Encounter: Payer: Self-pay | Admitting: Family Medicine

## 2016-08-15 VITALS — BP 153/68 | HR 64 | Temp 98.0°F | Ht 63.0 in | Wt 207.2 lb

## 2016-08-15 DIAGNOSIS — L723 Sebaceous cyst: Secondary | ICD-10-CM

## 2016-08-15 DIAGNOSIS — L089 Local infection of the skin and subcutaneous tissue, unspecified: Secondary | ICD-10-CM | POA: Diagnosis not present

## 2016-08-15 MED ORDER — CLINDAMYCIN HCL 150 MG PO CAPS: 150.0000 mg | ORAL_CAPSULE | Freq: Three times a day (TID) | ORAL | 0 refills | Status: DC

## 2016-08-15 NOTE — Progress Notes (Signed)
BP (!) 153/68 (BP Location: Right Arm, Patient Position: Sitting, Cuff Size: Normal)   Pulse 64   Temp 98 F (36.7 C)   Ht 5\' 3"  (1.6 m)   Wt 207 lb 3.2 oz (94 kg)   LMP  (LMP Unknown)   SpO2 100%   BMI 36.70 kg/m    Subjective:    Patient ID: Daisy Watkins, female    DOB: 07-29-1932, 83 y.o.   MRN: [id number]  HPI: FRANCESA EUGENIO is a 83 y.o. female  Chief Complaint  Patient presents with  . Cyst    On back. Been there a long time patient states. Was lanced here once before. Was sent to general surgery, they left it alone. Patient states it's became bigger.   Patient presents today with a painful, inflamed cyst on back that she states has been present for years. Flared like this previously and had it lanced with good relief. Current flare was first noticed a week or two ago and has persistently gotten worse. Area has not drained. Denies fever, chills, sweats, N/V. Has not been putting anything on the area.   Relevant past medical, surgical, family and social history reviewed and updated as indicated. Interim medical history since our last visit reviewed. Allergies and medications reviewed and updated.  Review of Systems  Constitutional: Negative.   HENT: Negative.   Respiratory: Negative.   Cardiovascular: Negative.   Gastrointestinal: Negative.   Genitourinary: Negative.   Musculoskeletal: Negative.   Skin: Positive for wound.  Neurological: Negative.   Psychiatric/Behavioral: Negative.    Per HPI unless specifically indicated above     Objective:    BP (!) 153/68 (BP Location: Right Arm, Patient Position: Sitting, Cuff Size: Normal)   Pulse 64   Temp 98 F (36.7 C)   Ht 5\' 3"  (1.6 m)   Wt 207 lb 3.2 oz (94 kg)   LMP  (LMP Unknown)   SpO2 100%   BMI 36.70 kg/m   Wt Readings from Last 3 Encounters:  08/20/16 209 lb (94.8 kg)  08/15/16 207 lb 3.2 oz (94 kg)  07/18/16 206 lb 6.4 oz (93.6 kg)    Physical Exam  Constitutional: She is oriented to person, place,  and time. She appears well-developed and well-nourished. No distress.  HENT:  Head: Atraumatic.  Eyes: Conjunctivae are normal. Pupils are equal, round, and reactive to light.  Neck: Normal range of motion. Neck supple.  Cardiovascular: Normal rate and normal heart sounds.   Pulmonary/Chest: Effort normal and breath sounds normal. No respiratory distress.  Musculoskeletal: Normal range of motion.  Neurological: She is alert and oriented to person, place, and time.  Skin: Skin is warm and dry.  5-6 cm inflamed, erythematous cyst on mid center back with some surrounding warmth and erythema.   Psychiatric: She has a normal mood and affect. Her behavior is normal.  Nursing note and vitals reviewed.  Procedure: I and D of inflamed sebaceous cyst mid-back Procedure discussed at length with patient, including risks of bleeding d/t coumadin. Pt's questions answered in full and she is agreeable to proceeding.  Area was infiltrated with 5 cc of 1% lidocaine with epi and prepped with betadine. Small incision made over center of cyst. Drained in full using sterile gauze pads and pressure. Bleeding easily controlled with pressure. Flushed with saline and dressed with neosporin and pressure dressing. Wound care discussed. Procedure was well tolerated with no immediate complications.   Results for orders placed or performed in visit  on 07/18/16  CoaguChek XS/INR Waived  Result Value Ref Range   INR 2.1 (H) 0.9 - 1.1   Prothrombin Time 24.8 sec      Assessment & Plan:   Problem List Items Addressed This Visit    None    Visit Diagnoses    Infected sebaceous cyst    -  Primary   Performed I and D today without complication. Discussed wound care. Start clindamycin. F/u for wound check early next week.       Follow up plan: Return in about 4 days (around 08/19/2016) for Wound check.

## 2016-08-19 ENCOUNTER — Other Ambulatory Visit: Payer: Self-pay | Admitting: Family Medicine

## 2016-08-20 ENCOUNTER — Encounter: Payer: Self-pay | Admitting: Family Medicine

## 2016-08-20 ENCOUNTER — Ambulatory Visit (INDEPENDENT_AMBULATORY_CARE_PROVIDER_SITE_OTHER): Payer: Medicare HMO | Admitting: Family Medicine

## 2016-08-20 VITALS — BP 142/80 | HR 59 | Temp 97.4°F | Wt 209.0 lb

## 2016-08-20 DIAGNOSIS — I82403 Acute embolism and thrombosis of unspecified deep veins of lower extremity, bilateral: Secondary | ICD-10-CM | POA: Diagnosis not present

## 2016-08-20 DIAGNOSIS — I480 Paroxysmal atrial fibrillation: Secondary | ICD-10-CM | POA: Diagnosis not present

## 2016-08-20 DIAGNOSIS — E1165 Type 2 diabetes mellitus with hyperglycemia: Secondary | ICD-10-CM

## 2016-08-20 DIAGNOSIS — I10 Essential (primary) hypertension: Secondary | ICD-10-CM | POA: Diagnosis not present

## 2016-08-20 DIAGNOSIS — Z794 Long term (current) use of insulin: Secondary | ICD-10-CM

## 2016-08-20 DIAGNOSIS — IMO0001 Reserved for inherently not codable concepts without codable children: Secondary | ICD-10-CM

## 2016-08-20 DIAGNOSIS — L723 Sebaceous cyst: Secondary | ICD-10-CM

## 2016-08-20 LAB — BAYER DCA HB A1C WAIVED: HB A1C: 7.6 % — AB (ref <7.0)

## 2016-08-20 LAB — COAGUCHEK XS/INR WAIVED
INR: 2.3 — AB (ref 0.9–1.1)
Prothrombin Time: 27.5 s

## 2016-08-20 MED ORDER — HYDROCHLOROTHIAZIDE 25 MG PO TABS: 25.0000 mg | ORAL_TABLET | Freq: Every day | ORAL | 3 refills | Status: DC

## 2016-08-20 MED ORDER — AMLODIPINE BESYLATE 10 MG PO TABS: 10.0000 mg | ORAL_TABLET | Freq: Every day | ORAL | 3 refills | Status: DC

## 2016-08-20 MED ORDER — SERTRALINE HCL 50 MG PO TABS: 50.0000 mg | ORAL_TABLET | Freq: Every day | ORAL | 3 refills | Status: DC

## 2016-08-20 MED ORDER — RAMIPRIL 10 MG PO CAPS: 10.0000 mg | ORAL_CAPSULE | Freq: Every day | ORAL | 3 refills | Status: DC

## 2016-08-20 MED ORDER — WARFARIN SODIUM 6 MG PO TABS: 6.0000 mg | ORAL_TABLET | Freq: Every day | ORAL | 3 refills | Status: DC

## 2016-08-20 NOTE — Assessment & Plan Note (Signed)
Diabetes doing well hemoglobin A1c down to 7.6 and first time under 8 and over a year. Patient doing well with diabetes will continue current care and treatment.

## 2016-08-20 NOTE — Assessment & Plan Note (Signed)
The current medical regimen is effective;  continue present plan and medications.  

## 2016-08-20 NOTE — Assessment & Plan Note (Signed)
Diastolic slightly elevated but otherwise okay blood pressure.

## 2016-08-20 NOTE — Assessment & Plan Note (Signed)
healing

## 2016-08-20 NOTE — Progress Notes (Signed)
   BP (!) 142/80 (BP Location: Left Arm)   Pulse (!) 59   Temp 97.4 F (36.3 C) (Oral)   Wt 209 lb (94.8 kg)   LMP  (LMP Unknown)   SpO2 98%   BMI 37.02 kg/m    Subjective:    Patient ID: Daisy Watkins, female    DOB: 12-07-1932, 83 y.o.   MRN: [id number]  HPI: Daisy Watkins is a 83 y.o. female  Chief Complaint  Patient presents with  . Coagulation Disorder  . Cyst  Patient all in all doing well takes warfarin 7 mg a day without problems no bleeding bruising issues no further clots wear support hose on her left leg. Cyst on back isn't draining anymore was unable to take antibiotics he was given because of upset stomach. Reviewed and cyst healing well without antibiotics will continue without antibiotics hot compresses as able  Relevant past medical, surgical, family and social history reviewed and updated as indicated. Interim medical history since our last visit reviewed. Allergies and medications reviewed and updated.  Review of Systems  Constitutional: Negative.   Respiratory: Negative.   Cardiovascular: Negative.     Per HPI unless specifically indicated above     Objective:    BP (!) 142/80 (BP Location: Left Arm)   Pulse (!) 59   Temp 97.4 F (36.3 C) (Oral)   Wt 209 lb (94.8 kg)   LMP  (LMP Unknown)   SpO2 98%   BMI 37.02 kg/m   Wt Readings from Last 3 Encounters:  08/20/16 209 lb (94.8 kg)  08/15/16 207 lb 3.2 oz (94 kg)  07/18/16 206 lb 6.4 oz (93.6 kg)    Physical Exam  Constitutional: She is oriented to person, place, and time. She appears well-developed and well-nourished.  HENT:  Head: Normocephalic and atraumatic.  Eyes: Conjunctivae and EOM are normal.  Neck: Normal range of motion.  Cardiovascular: Normal rate, regular rhythm and normal heart sounds.   Pulmonary/Chest: Effort normal and breath sounds normal.  Musculoskeletal: Normal range of motion.  Neurological: She is alert and oriented to person, place, and time.  Skin: No erythema.    Sebaceous cyst healing well.  Psychiatric: She has a normal mood and affect. Her behavior is normal. Judgment and thought content normal.    Results for orders placed or performed in visit on 07/18/16  CoaguChek XS/INR Waived  Result Value Ref Range   INR 2.1 (H) 0.9 - 1.1   Prothrombin Time 24.8 sec      Assessment & Plan:   Problem List Items Addressed This Visit      Cardiovascular and Mediastinum   Benign hypertension (Chronic)    Diastolic slightly elevated but otherwise okay blood pressure.      Paroxysmal atrial fibrillation ([facility]) - Primary   Relevant Orders   CoaguChek XS/INR Waived     Endocrine   Diabetes mellitus type 2, uncontrolled, without complications ([facility]) (Chronic)   Relevant Orders   Bayer DCA Hb A1c Waived       Follow up plan: No Follow-up on file.

## 2016-08-22 NOTE — Patient Instructions (Signed)
Follow up early next week for wound check

## 2016-08-29 ENCOUNTER — Encounter: Payer: Self-pay | Admitting: Family Medicine

## 2016-08-29 ENCOUNTER — Ambulatory Visit (INDEPENDENT_AMBULATORY_CARE_PROVIDER_SITE_OTHER): Payer: Medicare HMO | Admitting: Family Medicine

## 2016-08-29 VITALS — BP 166/64 | HR 70 | Temp 97.9°F | Wt 206.6 lb

## 2016-08-29 DIAGNOSIS — L723 Sebaceous cyst: Secondary | ICD-10-CM | POA: Diagnosis not present

## 2016-08-29 MED ORDER — AMOXICILLIN-POT CLAVULANATE 875-125 MG PO TABS: 1.0000 | ORAL_TABLET | Freq: Two times a day (BID) | ORAL | 0 refills | Status: DC

## 2016-08-29 NOTE — Progress Notes (Signed)
BP (!) 166/64 (BP Location: Left Arm, Patient Position: Sitting, Cuff Size: Large)   Pulse 70   Temp 97.9 F (36.6 C)   Wt 206 lb 9.6 oz (93.7 kg)   LMP  (LMP Unknown)   SpO2 97%   BMI 36.60 kg/m    Subjective:    Patient ID: Daisy Watkins, female    DOB: 07/06/1932, 83 y.o.   MRN: [id number]  HPI: Daisy Watkins is a 83 y.o. female  Chief Complaint  Patient presents with  . Cyst   Patient presents today for wound check on upper back cyst. Took 1 day of clindamycin after initial I and D but stopped d/t side effects. Cyst has refilled and been draining intermittently. Pt c/o pain and pressure in the area as well. Denies fever, chills, N/V. Is on chronic coumadin therapy.   Relevant past medical, surgical, family and social history reviewed and updated as indicated. Interim medical history since our last visit reviewed. Allergies and medications reviewed and updated.  Review of Systems  Constitutional: Negative.   Respiratory: Negative.   Cardiovascular: Negative.   Gastrointestinal: Negative.   Genitourinary: Negative.   Musculoskeletal: Negative.   Skin: Positive for wound.  Neurological: Negative.   Psychiatric/Behavioral: Negative.    Per HPI unless specifically indicated above     Objective:    BP (!) 166/64 (BP Location: Left Arm, Patient Position: Sitting, Cuff Size: Large)   Pulse 70   Temp 97.9 F (36.6 C)   Wt 206 lb 9.6 oz (93.7 kg)   LMP  (LMP Unknown)   SpO2 97%   BMI 36.60 kg/m   Wt Readings from Last 3 Encounters:  08/29/16 206 lb 9.6 oz (93.7 kg)  08/20/16 209 lb (94.8 kg)  08/15/16 207 lb 3.2 oz (94 kg)    Physical Exam  Constitutional: She is oriented to person, place, and time. She appears well-developed and well-nourished. No distress.  HENT:  Head: Atraumatic.  Eyes: Conjunctivae are normal. Pupils are equal, round, and reactive to light.  Neck: Normal range of motion. Neck supple.  Cardiovascular: Normal rate and normal heart sounds.     Pulmonary/Chest: Effort normal and breath sounds normal. No respiratory distress.  Musculoskeletal: Normal range of motion.  Neurological: She is alert and oriented to person, place, and time.  Skin: Skin is warm and dry.  Inflamed ebaceous cyst mid-upper back with multiple extensions circumferentially   Psychiatric: She has a normal mood and affect. Her behavior is normal.  Nursing note and vitals reviewed.   Results for orders placed or performed in visit on 08/20/16  CoaguChek XS/INR Waived  Result Value Ref Range   INR 2.3 (H) 0.9 - 1.1   Prothrombin Time 27.5 sec  Bayer DCA Hb A1c Waived  Result Value Ref Range   Bayer DCA Hb A1c Waived 7.6 (H) <7.0 %      Assessment & Plan:   Problem List Items Addressed This Visit      Musculoskeletal and Integument   Sebaceous cyst - Primary    Area drained today using gauze and pressure. Area thoroughly cleaned and pressure dressing applied. Augmentin sent. Wound care discussed. F/u in 5 days      Relevant Orders   Ambulatory referral to General Surgery    Patient ready to have area fully excised. Given chronic coumadin therapy as well as seemingly somewhat extensive involvement branching out from main cyst, will refer to general surgery for full excision procedure.   Follow  up plan: Return in about 5 days (around 09/03/2016) for Wound recheck.

## 2016-08-29 NOTE — Assessment & Plan Note (Signed)
Area drained today using gauze and pressure. Area thoroughly cleaned and pressure dressing applied. Augmentin sent. Wound care discussed. F/u in 5 days

## 2016-09-03 ENCOUNTER — Telehealth: Payer: Self-pay | Admitting: Unknown Physician Specialty

## 2016-09-03 ENCOUNTER — Ambulatory Visit: Payer: Medicare HMO | Admitting: Unknown Physician Specialty

## 2016-09-03 NOTE — Telephone Encounter (Signed)
Called pt to schedule Annual Wellness Visit with Nurse Health Advisor for 6/6:  - knb

## 2016-09-05 ENCOUNTER — Ambulatory Visit (INDEPENDENT_AMBULATORY_CARE_PROVIDER_SITE_OTHER): Payer: Medicare HMO | Admitting: Unknown Physician Specialty

## 2016-09-05 ENCOUNTER — Encounter: Payer: Self-pay | Admitting: Unknown Physician Specialty

## 2016-09-05 ENCOUNTER — Ambulatory Visit (INDEPENDENT_AMBULATORY_CARE_PROVIDER_SITE_OTHER): Payer: Medicare HMO

## 2016-09-05 VITALS — BP 196/69 | HR 85 | Temp 98.4°F | Ht 63.0 in | Wt 205.8 lb

## 2016-09-05 DIAGNOSIS — Z0000 Encounter for general adult medical examination without abnormal findings: Secondary | ICD-10-CM

## 2016-09-05 DIAGNOSIS — I10 Essential (primary) hypertension: Secondary | ICD-10-CM | POA: Diagnosis not present

## 2016-09-05 DIAGNOSIS — L989 Disorder of the skin and subcutaneous tissue, unspecified: Secondary | ICD-10-CM | POA: Insufficient documentation

## 2016-09-05 HISTORY — DX: Disorder of the skin and subcutaneous tissue, unspecified: L98.9

## 2016-09-05 NOTE — Assessment & Plan Note (Signed)
No drainage or erythema today.  No need for further intervention.  Waiting for a surgical consult

## 2016-09-05 NOTE — Progress Notes (Signed)
Dictation #1 [id number]  [id number]   BP (!) 197/75 (BP Location: Left Arm, Cuff Size: Normal)   Pulse 85   Temp 98.4 F (36.9 C)   Wt 205 lb 12.8 oz (93.4 kg)   LMP  (LMP Unknown)   SpO2 98%   BMI 36.46 kg/m    Subjective:    Patient ID: Daisy Watkins, female    DOB: [birth year], 83 y.o.   MRN: [id number]  HPI: Daisy Watkins is a 83 y.o. female  Chief Complaint  Patient presents with  . Wound Check    pt seeing surgery soon to remove cyst   Pt has a cyst on back.  See last note.  Needs to be rechecked.  This has stopped draining and no pain.  Awaiting a surgical consult  BP high today.  States she did not take Ramipril.    Relevant past medical, surgical, family and social history reviewed and updated as indicated. Interim medical history since our last visit reviewed. Allergies and medications reviewed and updated.  Review of Systems  Per HPI unless specifically indicated above     Objective:    BP (!) 197/75 (BP Location: Left Arm, Cuff Size: Normal)   Pulse 85   Temp 98.4 F (36.9 C)   Wt 205 lb 12.8 oz (93.4 kg)   LMP  (LMP Unknown)   SpO2 98%   BMI 36.46 kg/m   Wt Readings from Last 3 Encounters:  09/05/16 205 lb 12.8 oz (93.4 kg)  08/29/16 206 lb 9.6 oz (93.7 kg)  08/20/16 209 lb (94.8 kg)    Physical Exam  Constitutional: She is oriented to person, place, and time. She appears well-developed and well-nourished. No distress.  HENT:  Head: Normocephalic and atraumatic.  Eyes: Conjunctivae and lids are normal. Right eye exhibits no discharge. Left eye exhibits no discharge. No scleral icterus.  Cardiovascular: Normal rate.   Pulmonary/Chest: Effort normal.  Abdominal: Normal appearance. There is no splenomegaly or hepatomegaly.  Musculoskeletal: Normal range of motion.  Neurological: She is alert and oriented to person, place, and time.  Skin: Skin is intact.  Back lesion without swelling, erythema, or drainage  Psychiatric: She has a normal  mood and affect. Her behavior is normal. Judgment and thought content normal.    Results for orders placed or performed in visit on 08/20/16  CoaguChek XS/INR Waived  Result Value Ref Range   INR 2.3 (H) 0.9 - 1.1   Prothrombin Time 27.5 sec  Bayer DCA Hb A1c Waived  Result Value Ref Range   Bayer DCA Hb A1c Waived 7.6 (H) <7.0 %      Assessment & Plan:   Problem List Items Addressed This Visit      Unprioritized   Benign hypertension (Chronic)    High today.  Known white coat and did not take Ramipril.  Instructed to take as soon as possible.  No symptomatic signs of high blood pressure      Skin lesion of back    No drainage or erythema today.  No need for further intervention.  Waiting for a surgical consult          Follow up plan: Return in about 3 weeks (around 09/26/2016) for INR.

## 2016-09-05 NOTE — Progress Notes (Signed)
Subjective:   Daisy Watkins is a 83 y.o. female who presents for Medicare Annual (Subsequent) preventive examination.  Review of Systems:   Cardiac Risk Factors include: diabetes mellitus;hypertension;dyslipidemia;advanced age (>44men, >35 women)     Objective:     Vitals: BP (!) 196/69 (BP Location: Left Arm, Patient Position: Sitting)   Pulse 85   Temp 98.4 F (36.9 C)   Ht 5\' 3"  (1.6 m)   Wt 205 lb 12.8 oz (93.4 kg)   LMP  (LMP Unknown)   BMI 36.46 kg/m   Body mass index is 36.46 kg/m.   Tobacco History  Smoking Status  . Former Smoker  . Packs/day: 0.50  . Years: 30.00  . Types: Cigarettes  . Quit date: 03/31/1977  Smokeless Tobacco  . Never Used     Counseling given: Not Answered   Past Medical History:  Diagnosis Date  . Anxiety   . Diabetes mellitus without complication ([place])   . DVT (deep venous thrombosis) ([place]) 2006   chronic anticoagulation  . GERD (gastroesophageal reflux disease)   . Hyperlipidemia   . Hypertension   . Insomnia   . Vitamin B12 deficiency   . Vitamin D deficiency disease    Past Surgical History:  Procedure Laterality Date  . ABDOMINAL HYSTERECTOMY     Family History  Problem Relation Age of Onset  . Diabetes Maternal Grandmother   . Cancer Daughter        breast  . Heart disease Son   . Heart disease Son    History  Sexual Activity  . Sexual activity: No    Outpatient Encounter Prescriptions as of 09/05/2016  Medication Sig  . amLODipine (NORVASC) 10 MG tablet Take 1 tablet (10 mg total) by mouth daily.  Marland Kitchen erythromycin [facility]) ophthalmic ointment Place 1 application into the left eye at bedtime.  Marland Kitchen glucose blood test strip 1 each by Other route as needed for other. Check sugars TID, diagnosis is E11.65. LON-99.  . hydrochlorothiazide (HYDRODIURIL) 25 MG tablet Take 1 tablet (25 mg total) by mouth daily.  . insulin NPH-regular Human (NOVOLIN 70/30) (70-30) 100 UNIT/ML injection INCREASE AS DIRECTED TO 45 UNITS  SUBCUTANEOUSLY EVERY MORNING WITH BREAKFAST AND 30 UNITS BEFORE EVENING MEAL OR AS DIRECTED  . Insulin Syringe-Needle U-100 (B-D INS SYRINGE 0.5CC/31GX5/16) 31G X 5/16" 0.5 ML MISC Inject 1 Units as directed 2 (two) times daily.  Marland Kitchen LORazepam (ATIVAN) 1 MG tablet TAKE ONE TABLET BY MOUTH AT BEDTIME AS NEEDED FOR ANXIETY  . magnesium oxide (MAG-OX) 400 MG tablet Take by mouth.  . montelukast (SINGULAIR) 10 MG tablet Take 1 tablet (10 mg total) by mouth at bedtime.  Marland Kitchen omeprazole (PRILOSEC) 40 MG capsule TAKE ONE (1) CAPSULE EACH DAY  . ramipril (ALTACE) 10 MG capsule Take 1 capsule (10 mg total) by mouth daily.  . sertraline (ZOLOFT) 50 MG tablet Take 1 tablet (50 mg total) by mouth daily.  Marland Kitchen warfarin (COUMADIN) 1 MG tablet TAKE ONE TABLET BY MOUTH AT 6 P.M.  . warfarin (COUMADIN) 6 MG tablet Take 1 tablet (6 mg total) by mouth daily.  . [DISCONTINUED] amoxicillin-clavulanate (AUGMENTIN) 875-125 MG tablet Take 1 tablet by mouth 2 (two) times daily.   No facility-administered encounter medications on file as of 09/05/2016.     Activities of Daily Living In your present state of health, do you have any difficulty performing the following activities: 09/05/2016 09/05/2016  Hearing? N N  Vision? N Y  Difficulty concentrating or making  decisions? N N  Walking or climbing stairs? N N  Dressing or bathing? N N  Doing errands, shopping? N N  Preparing Food and eating ? N -  Using the Toilet? N -  In the past six months, have you accidently leaked urine? N -  Do you have problems with loss of bowel control? N -  Managing your Medications? N -  Managing your Finances? N -  Housekeeping or managing your Housekeeping? N -  Some recent data might be hidden    Patient Care Team: Kathrine Haddock, NP as PCP - General (Nurse Practitioner) Christene Lye, MD (General Surgery) Corey Skains, MD as Consulting Physician (Cardiology)    Assessment:     Exercise Activities and Dietary  recommendations Current Exercise Habits: Home exercise routine, Type of exercise: walking, Time (Minutes): 30, Frequency (Times/Week): 3, Weekly Exercise (Minutes/Week): 90, Intensity: Mild, Exercise limited by: None identified  Goals    . Increase water intake          Recommend drinking at least 3-4 glasses of water a day       Fall Risk Fall Risk  09/05/2016 09/05/2016 08/20/2016 04/02/2016 03/21/2015  Falls in the past year? Yes Yes Yes No No  Number falls in past yr: 1 1 1  - -  Injury with Fall? No Yes - - -  Follow up - - Falls evaluation completed - -   Depression Screen PHQ 2/9 Scores 09/05/2016 09/05/2016 04/02/2016 09/19/2015  PHQ - 2 Score 0 0 1 6  PHQ- 9 Score - 3 - 17     Cognitive Function     6CIT Screen 09/05/2016  What Year? 0 points  What month? 0 points  What time? 0 points  Count back from 20 0 points  Months in reverse 0 points  Repeat phrase 0 points  Total Score 0    Immunization History  Administered Date(s) Administered  . Influenza, High Dose Seasonal PF 12/25/2015  . Influenza,inj,Quad PF,36+ Mos 03/21/2015  . Influenza-Unspecified 12/22/2013   Screening Tests Health Maintenance  Topic Date Due  . FOOT EXAM  03/20/2016  . TETANUS/TDAP  11/22/2016 (Originally 09/15/1951)  . PNA vac Low Risk Adult (1 of 2 - PCV13) 11/22/2016 (Originally 09/16/1999)  . INFLUENZA VACCINE  10/29/2016  . HEMOGLOBIN A1C  02/20/2017  . OPHTHALMOLOGY EXAM  09/05/2017  . DEXA SCAN  Completed      Plan:  I have personally reviewed and addressed the Medicare Annual Wellness questionnaire and have noted the following in the patient's chart:  A. Medical and social history B. Use of alcohol, tobacco or illicit drugs  C. Current medications and supplements D. Functional ability and status E.  Nutritional status F.  Physical activity G. Advance directives H. List of other physicians I.  Hospitalizations, surgeries, and ER visits in previous 12 months J.  [place]  such as hearing and vision if needed, cognitive and depression L. Referrals and appointments   In addition, I have reviewed and discussed with patient certain preventive protocols, quality metrics, and best practice recommendations. A written personalized care plan for preventive services as well as general preventive health recommendations were provided to patient.   Signed,  Tyler Aas, LPN Nurse Health Advisor   MD Recommendations: Needs diabetic foot exam at next visit

## 2016-09-05 NOTE — Patient Instructions (Addendum)
Daisy Watkins , Thank you for taking time to come for your Medicare Wellness Visit. I appreciate your ongoing commitment to your health goals. Please review the following plan we discussed and let me know if I can assist you in the future.   Screening recommendations/referrals: Colonoscopy: no longer required Mammogram: no longer required Bone Density: done Recommended yearly ophthalmology/optometry visit for glaucoma screening and checkup Recommended yearly dental visit for hygiene and checkup  Vaccinations: Influenza vaccine: up to date, due 11/2016 Pneumococcal vaccine: prevnar 13 due- declined Tdap vaccine: due, check with your insurance company for coverage  Shingles vaccine: due, check with your insurance company for coverage   Advanced directives: Advance directive discussed with you today. I have provided a copy for you to complete at home and have notarized. Once this is complete please bring a copy in to our office so we can scan it into your chart.  Conditions/risks identified: Recommend drinking at least 3-4 glasses of water a day   Next appointment: Follow up in one year for your annual wellness exam.    Preventive Care 65 Years and Older, Female Preventive care refers to lifestyle choices and visits with your health care provider that can promote health and wellness. What does preventive care include?  A yearly physical exam. This is also called an annual well check.  Dental exams once or twice a year.  Routine eye exams. Ask your health care provider how often you should have your eyes checked.  Personal lifestyle choices, including:  Daily care of your teeth and gums.  Regular physical activity.  Eating a healthy diet.  Avoiding tobacco and drug use.  Limiting alcohol use.  Practicing safe sex.  Taking low-dose aspirin every day.  Taking vitamin and mineral supplements as recommended by your health care provider. What happens during an annual well  check? The services and screenings done by your health care provider during your annual well check will depend on your age, overall health, lifestyle risk factors, and family history of disease. Counseling  Your health care provider may ask you questions about your:  Alcohol use.  Tobacco use.  Drug use.  Emotional well-being.  Home and relationship well-being.  Sexual activity.  Eating habits.  History of falls.  Memory and ability to understand (cognition).  Work and work Statistician.  Reproductive health. Screening  You may have the following tests or measurements:  Height, weight, and BMI.  Blood pressure.  Lipid and cholesterol levels. These may be checked every 5 years, or more frequently if you are over 73 years old.  Skin check.  Lung cancer screening. You may have this screening every year starting at age 2 if you have a 30-pack-year history of smoking and currently smoke or have quit within the past 15 years.  Fecal occult blood test (FOBT) of the stool. You may have this test every year starting at age 45.  Flexible sigmoidoscopy or colonoscopy. You may have a sigmoidoscopy every 5 years or a colonoscopy every 10 years starting at age 27.  Hepatitis C blood test.  Hepatitis B blood test.  Sexually transmitted disease (STD) testing.  Diabetes screening. This is done by checking your blood sugar (glucose) after you have not eaten for a while (fasting). You may have this done every 1-3 years.  Bone density scan. This is done to screen for osteoporosis. You may have this done starting at age 70.  Mammogram. This may be done every 1-2 years. Talk to your health care  provider about how often you should have regular mammograms. Talk with your health care provider about your test results, treatment options, and if necessary, the need for more tests. Vaccines  Your health care provider may recommend certain vaccines, such as:  Influenza vaccine. This is  recommended every year.  Tetanus, diphtheria, and acellular pertussis (Tdap, Td) vaccine. You may need a Td booster every 10 years.  Zoster vaccine. You may need this after age 49.  Pneumococcal 13-valent conjugate (PCV13) vaccine. One dose is recommended after age 42.  Pneumococcal polysaccharide (PPSV23) vaccine. One dose is recommended after age 76. Talk to your health care provider about which screenings and vaccines you need and how often you need them. This information is not intended to replace advice given to you by your health care provider. Make sure you discuss any questions you have with your health care provider. Document Released: 04/13/2015 Document Revised: 12/05/2015 Document Reviewed: 01/16/2015 Elsevier Interactive Patient Education  2017 [place] Prevention in the Home Falls can cause injuries. They can happen to people of all ages. There are many things you can do to make your home safe and to help prevent falls. What can I do on the outside of my home?  Regularly fix the edges of walkways and driveways and fix any cracks.  Remove anything that might make you trip as you walk through a door, such as a raised step or threshold.  Trim any bushes or trees on the path to your home.  Use bright outdoor lighting.  Clear any walking paths of anything that might make someone trip, such as rocks or tools.  Regularly check to see if handrails are loose or broken. Make sure that both sides of any steps have handrails.  Any raised decks and porches should have guardrails on the edges.  Have any leaves, snow, or ice cleared regularly.  Use sand or salt on walking paths during winter.  Clean up any spills in your garage right away. This includes oil or grease spills. What can I do in the bathroom?  Use night lights.  Install grab bars by the toilet and in the tub and shower. Do not use towel bars as grab bars.  Use non-skid mats or decals in the tub or  shower.  If you need to sit down in the shower, use a plastic, non-slip stool.  Keep the floor dry. Clean up any water that spills on the floor as soon as it happens.  Remove soap buildup in the tub or shower regularly.  Attach bath mats securely with double-sided non-slip rug tape.  Do not have throw rugs and other things on the floor that can make you trip. What can I do in the bedroom?  Use night lights.  Make sure that you have a light by your bed that is easy to reach.  Do not use any sheets or blankets that are too big for your bed. They should not hang down onto the floor.  Have a firm chair that has side arms. You can use this for support while you get dressed.  Do not have throw rugs and other things on the floor that can make you trip. What can I do in the kitchen?  Clean up any spills right away.  Avoid walking on wet floors.  Keep items that you use a lot in easy-to-reach places.  If you need to reach something above you, use a strong step stool that has a grab bar.  Keep electrical cords out of the way.  Do not use floor polish or wax that makes floors slippery. If you must use wax, use non-skid floor wax.  Do not have throw rugs and other things on the floor that can make you trip. What can I do with my stairs?  Do not leave any items on the stairs.  Make sure that there are handrails on both sides of the stairs and use them. Fix handrails that are broken or loose. Make sure that handrails are as long as the stairways.  Check any carpeting to make sure that it is firmly attached to the stairs. Fix any carpet that is loose or worn.  Avoid having throw rugs at the top or bottom of the stairs. If you do have throw rugs, attach them to the floor with carpet tape.  Make sure that you have a light switch at the top of the stairs and the bottom of the stairs. If you do not have them, ask someone to add them for you. What else can I do to help prevent  falls?  Wear shoes that:  Do not have high heels.  Have rubber bottoms.  Are comfortable and fit you well.  Are closed at the toe. Do not wear sandals.  If you use a stepladder:  Make sure that it is fully opened. Do not climb a closed stepladder.  Make sure that both sides of the stepladder are locked into place.  Ask someone to hold it for you, if possible.  Clearly mark and make sure that you can see:  Any grab bars or handrails.  First and last steps.  Where the edge of each step is.  Use tools that help you move around (mobility aids) if they are needed. These include:  Canes.  Walkers.  Scooters.  Crutches.  Turn on the lights when you go into a dark area. Replace any light bulbs as soon as they burn out.  Set up your furniture so you have a clear path. Avoid moving your furniture around.  If any of your floors are uneven, fix them.  If there are any pets around you, be aware of where they are.  Review your medicines with your doctor. Some medicines can make you feel dizzy. This can increase your chance of falling. Ask your doctor what other things that you can do to help prevent falls. This information is not intended to replace advice given to you by your health care provider. Make sure you discuss any questions you have with your health care provider. Document Released: 01/11/2009 Document Revised: 08/23/2015 Document Reviewed: 04/21/2014 Elsevier Interactive Patient Education  2017 [employer].

## 2016-09-05 NOTE — Assessment & Plan Note (Signed)
High today.  Known white coat and did not take Ramipril.  Instructed to take as soon as possible.  No symptomatic signs of high blood pressure

## 2016-09-09 ENCOUNTER — Telehealth: Payer: Self-pay | Admitting: Unknown Physician Specialty

## 2016-09-09 NOTE — Telephone Encounter (Signed)
Patient states that Daisy Watkins was going to give her some form or paper last week but she cannot locate it.  She would like for you to call her.  Thanks  [phone]

## 2016-09-09 NOTE — Telephone Encounter (Signed)
Called and spoke to patient. She stated that she was given a five wishes form at her Wellness visit on Friday but states she cannot find the five wishes packet. She asked if she could have another one and I told the patient that I would put one up front for her to come pick up. Patient stated that she would probably come by tomorrow.

## 2016-09-11 ENCOUNTER — Encounter: Payer: Self-pay | Admitting: *Deleted

## 2016-09-17 ENCOUNTER — Encounter: Payer: Self-pay | Admitting: General Surgery

## 2016-09-17 ENCOUNTER — Ambulatory Visit (INDEPENDENT_AMBULATORY_CARE_PROVIDER_SITE_OTHER): Payer: Medicare HMO | Admitting: General Surgery

## 2016-09-17 VITALS — BP 148/70 | HR 76 | Resp 14 | Ht 60.0 in | Wt 204.0 lb

## 2016-09-17 DIAGNOSIS — L723 Sebaceous cyst: Secondary | ICD-10-CM

## 2016-09-17 NOTE — Patient Instructions (Addendum)
Patient to return in three weeks for back  cyst excision . Stop her coumadin five days before procedure.

## 2016-09-17 NOTE — Progress Notes (Signed)
Patient ID: Daisy Watkins, female   DOB: Aug 15, 1932, 83 y.o.   MRN: [id number]  Chief Complaint  Patient presents with  . Cyst    HPI Daisy Watkins is a 83 y.o. female here today for a evaluation of a recurrent back cyst. Patient states she noticed this area five years ago. The area has drained off and on.  HPI  Past Medical History:  Diagnosis Date  . Anxiety   . Diabetes mellitus without complication ([place])   . DVT (deep venous thrombosis) ([place]) 2006   chronic anticoagulation  . GERD (gastroesophageal reflux disease)   . Hyperlipidemia   . Hypertension   . Insomnia   . Vitamin B12 deficiency   . Vitamin D deficiency disease     Past Surgical History:  Procedure Laterality Date  . ABDOMINAL HYSTERECTOMY      Family History  Problem Relation Age of Onset  . Diabetes Maternal Grandmother   . Cancer Daughter        breast  . Heart disease Son   . Heart disease Son     Social History Social History  Substance Use Topics  . Smoking status: Former Smoker    Packs/day: 0.50    Years: 30.00    Types: Cigarettes    Quit date: 03/31/1977  . Smokeless tobacco: Never Used  . Alcohol use No     Comment: rare    Allergies  Allergen Reactions  . Clindamycin/Lincomycin Nausea Only  . Flonase [Fluticasone Propionate] Other (See Comments)    headaches  . Sulfur   . Doxycycline Hyclate Palpitations    Current Outpatient Prescriptions  Medication Sig Dispense Refill  . amLODipine (NORVASC) 10 MG tablet Take 1 tablet (10 mg total) by mouth daily. 30 tablet 3  . erythromycin (ROMYCIN) ophthalmic ointment Place 1 application into the left eye at bedtime. 3.5 g 0  . glucose blood test strip 1 each by Other route as needed for other. Check sugars TID, diagnosis is E11.65. LON-99.    . hydrochlorothiazide (HYDRODIURIL) 25 MG tablet Take 1 tablet (25 mg total) by mouth daily. 30 tablet 3  . insulin NPH-regular Human (NOVOLIN 70/30) (70-30) 100 UNIT/ML injection INCREASE AS DIRECTED  TO 45 UNITS SUBCUTANEOUSLY EVERY MORNING WITH BREAKFAST AND 30 UNITS BEFORE EVENING MEAL OR AS DIRECTED 40 mL 12  . Insulin Syringe-Needle U-100 (B-D INS SYRINGE 0.5CC/31GX5/16) 31G X 5/16" 0.5 ML MISC Inject 1 Units as directed 2 (two) times daily. 60 each 12  . LORazepam (ATIVAN) 1 MG tablet TAKE ONE TABLET BY MOUTH AT BEDTIME AS NEEDED FOR ANXIETY 30 tablet 2  . magnesium oxide (MAG-OX) 400 MG tablet Take by mouth.    . montelukast (SINGULAIR) 10 MG tablet Take 1 tablet (10 mg total) by mouth at bedtime. 30 tablet 3  . omeprazole (PRILOSEC) 40 MG capsule TAKE ONE (1) CAPSULE EACH DAY 30 capsule 12  . ramipril (ALTACE) 10 MG capsule Take 1 capsule (10 mg total) by mouth daily. 30 capsule 3  . sertraline (ZOLOFT) 50 MG tablet Take 1 tablet (50 mg total) by mouth daily. 30 tablet 3  . warfarin (COUMADIN) 1 MG tablet TAKE ONE TABLET BY MOUTH AT 6 P.M. 30 tablet 3  . warfarin (COUMADIN) 6 MG tablet Take 1 tablet (6 mg total) by mouth daily. 30 tablet 3   No current facility-administered medications for this visit.     Review of Systems Review of Systems  Constitutional: Negative.   Respiratory: Negative.  Cardiovascular: Negative.     Blood pressure (!) 148/70, pulse 76, resp. rate 14, height 5' (1.524 m), weight 204 lb (92.5 kg).  Physical Exam Physical Exam  Constitutional: She is oriented to person, place, and time. She appears well-developed and well-nourished.  Eyes: Conjunctivae are normal. No scleral icterus.  Neck: Neck supple.  Cardiovascular: Normal rate, regular rhythm and normal heart sounds.   Pulmonary/Chest: Effort normal and breath sounds normal.  Lymphadenopathy:    She has no cervical adenopathy.  Neurological: She is alert and oriented to person, place, and time.  Skin: Skin is warm and dry.       Data Reviewed Notes reviewed.  Assessment    Skin cyst on upper back, with history of recurring infection. Most recent drainage was 2 weeks ago. She did complete  1 week of antibiotic     Plan    Patient to return in three weeks for back  cyst excision . Stop her coumadin five days before procedure and stay off for 2 days after procedure.  HPI, Physical Exam, Assessment and Plan have been scribed under the direction and in the presence of Mckinley Jewel, MD  Gaspar Cola, CMA     I have completed the exam and reviewed the above documentation for accuracy and completeness.  I agree with the above.  Haematologist has been used and any errors in dictation or transcription are unintentional.  Montrae Braithwaite G. Jamal Collin, M.D., F.A.C.S.   Junie Panning G 09/17/2016, 1:20 PM

## 2016-09-30 ENCOUNTER — Encounter: Payer: Self-pay | Admitting: Unknown Physician Specialty

## 2016-09-30 ENCOUNTER — Ambulatory Visit (INDEPENDENT_AMBULATORY_CARE_PROVIDER_SITE_OTHER): Payer: Medicare HMO | Admitting: Unknown Physician Specialty

## 2016-09-30 ENCOUNTER — Telehealth: Payer: Self-pay | Admitting: Unknown Physician Specialty

## 2016-09-30 VITALS — BP 162/73 | HR 56 | Temp 98.7°F | Wt 207.4 lb

## 2016-09-30 DIAGNOSIS — Z66 Do not resuscitate: Secondary | ICD-10-CM | POA: Diagnosis not present

## 2016-09-30 DIAGNOSIS — Z7189 Other specified counseling: Secondary | ICD-10-CM | POA: Diagnosis not present

## 2016-09-30 DIAGNOSIS — I480 Paroxysmal atrial fibrillation: Secondary | ICD-10-CM | POA: Diagnosis not present

## 2016-09-30 LAB — COAGUCHEK XS/INR WAIVED
INR: 3.1 — ABNORMAL HIGH (ref 0.9–1.1)
PROTHROMBIN TIME: 37.6 s

## 2016-09-30 NOTE — Progress Notes (Signed)
   BP (!) 162/73 (BP Location: Left Arm, Cuff Size: Normal)   Pulse (!) 56   Temp 98.7 F (37.1 C)   Wt 207 lb 6.4 oz (94.1 kg)   LMP  (LMP Unknown)   SpO2 96%   BMI 40.51 kg/m    Subjective:    Patient ID: Daisy Watkins, female    DOB: 06-02-1932, 83 y.o.   MRN: [id number]  HPI: Daisy Watkins is a 83 y.o. female  Chief Complaint  Patient presents with  . Atrial Fibrillation    PT/INR- pt taking 7 mg of warfarin daily...states she is going to have to stop some her medicaitons for 6 days due to an upcoming surgery.   Coumadin She is taking 7 mg  Pt is here for a 1 week INR check. Last INR was  2.3  No new bruising or bleeding.  Diet without changes.  No new medications.   Relevant past medical, surgical, family and social history reviewed and updated as indicated. Interim medical history since our last visit reviewed. Allergies and medications reviewed and updated.  Review of Systems  Per HPI unless specifically indicated above     Objective:    BP (!) 162/73 (BP Location: Left Arm, Cuff Size: Normal)   Pulse (!) 56   Temp 98.7 F (37.1 C)   Wt 207 lb 6.4 oz (94.1 kg)   LMP  (LMP Unknown)   SpO2 96%   BMI 40.51 kg/m   Wt Readings from Last 3 Encounters:  09/30/16 207 lb 6.4 oz (94.1 kg)  09/17/16 204 lb (92.5 kg)  09/05/16 205 lb 12.8 oz (93.4 kg)    Physical Exam  Constitutional: She is oriented to person, place, and time. She appears well-developed and well-nourished. No distress.  HENT:  Head: Normocephalic and atraumatic.  Eyes: Conjunctivae and lids are normal. Right eye exhibits no discharge. Left eye exhibits no discharge. No scleral icterus.  Neck: Normal range of motion. Neck supple. No JVD present. Carotid bruit is not present.  Cardiovascular: Normal rate, regular rhythm and normal heart sounds.   Pulmonary/Chest: Effort normal and breath sounds normal.  Abdominal: Normal appearance. There is no splenomegaly or hepatomegaly.    Musculoskeletal: Normal range of motion.  Neurological: She is alert and oriented to person, place, and time.  Skin: Skin is warm, dry and intact. No rash noted. No pallor.  Psychiatric: She has a normal mood and affect. Her behavior is normal. Judgment and thought content normal.    Results for orders placed or performed in visit on 08/20/16  CoaguChek XS/INR Waived  Result Value Ref Range   INR 2.3 (H) 0.9 - 1.1   Prothrombin Time 27.5 sec  Bayer DCA Hb A1c Waived  Result Value Ref Range   Bayer DCA Hb A1c Waived 7.6 (H) <7.0 %      Assessment & Plan:   Problem List Items Addressed This Visit      Unprioritized   Advanced care planning/counseling discussion    Filling out 5 wishes.  Youngest daughter is health care proxy      DNR (do not resuscitate)    Patient wishes not to be resuscitated.        Paroxysmal atrial fibrillation (HCC) - Primary   Relevant Orders   CoaguChek XS/INR Waived       Follow up plan: Return for 1 month for physical, INR and chronic disease.  Will need extra time

## 2016-09-30 NOTE — Assessment & Plan Note (Signed)
Patient wishes not to be resuscitated.

## 2016-09-30 NOTE — Assessment & Plan Note (Signed)
Filling out 5 wishes.  Youngest daughter is health care proxy

## 2016-09-30 NOTE — Telephone Encounter (Signed)
LM to discuss Community Resource Referral left c/b # [phone]

## 2016-10-09 ENCOUNTER — Encounter: Payer: Self-pay | Admitting: General Surgery

## 2016-10-09 ENCOUNTER — Ambulatory Visit (INDEPENDENT_AMBULATORY_CARE_PROVIDER_SITE_OTHER): Payer: Medicare HMO | Admitting: General Surgery

## 2016-10-09 VITALS — BP 150/76 | HR 7 | Resp 12 | Ht 64.0 in | Wt 209.0 lb

## 2016-10-09 DIAGNOSIS — L720 Epidermal cyst: Secondary | ICD-10-CM | POA: Diagnosis not present

## 2016-10-09 DIAGNOSIS — L723 Sebaceous cyst: Secondary | ICD-10-CM | POA: Diagnosis not present

## 2016-10-09 NOTE — Patient Instructions (Signed)
You may resume coumadin on Sunday, 10-12-16, as long as you are not bleeding.   Return to the office for a nurse check in 10-14 days.

## 2016-10-09 NOTE — Progress Notes (Signed)
Patient ID: Daisy Watkins, female   DOB: [birth year], 83 y.o.   MRN: [id number]  Chief Complaint  Patient presents with  . Follow-up    HPI Daisy Watkins is a 83 y.o. female here today for a excision skin cyst on back.  HPI  Past Medical History:  Diagnosis Date  . Anxiety   . Diabetes mellitus without complication ([place])   . DVT (deep venous thrombosis) ([place]) 2006   chronic anticoagulation  . GERD (gastroesophageal reflux disease)   . Hyperlipidemia   . Hypertension   . Insomnia   . Vitamin B12 deficiency   . Vitamin D deficiency disease     Past Surgical History:  Procedure Laterality Date  . ABDOMINAL HYSTERECTOMY      Family History  Problem Relation Age of Onset  . Diabetes Maternal Grandmother   . Cancer Daughter        breast  . Heart disease Son   . Heart disease Son     Social History Social History  Substance Use Topics  . Smoking status: Former Smoker    Packs/day: 0.50    Years: 30.00    Types: Cigarettes    Quit date: 03/31/1977  . Smokeless tobacco: Never Used  . Alcohol use No     Comment: rare    Allergies  Allergen Reactions  . Clindamycin/Lincomycin Nausea Only  . Flonase [Fluticasone Propionate] Other (See Comments)    headaches  . Sulfur   . Doxycycline Hyclate Palpitations    Current Outpatient Prescriptions  Medication Sig Dispense Refill  . amLODipine (NORVASC) 10 MG tablet Take 1 tablet (10 mg total) by mouth daily. 30 tablet 3  . erythromycin (ROMYCIN) ophthalmic ointment Place 1 application into the left eye at bedtime. 3.5 g 0  . glucose blood test strip 1 each by Other route as needed for other. Check sugars TID, diagnosis is E11.65. LON-99.    . hydrochlorothiazide (HYDRODIURIL) 25 MG tablet Take 1 tablet (25 mg total) by mouth daily. 30 tablet 3  . insulin NPH-regular Human (NOVOLIN 70/30) (70-30) 100 UNIT/ML injection INCREASE AS DIRECTED TO 45 UNITS SUBCUTANEOUSLY EVERY MORNING WITH BREAKFAST AND 30 UNITS BEFORE EVENING MEAL  OR AS DIRECTED 40 mL 12  . Insulin Syringe-Needle U-100 (B-D INS SYRINGE 0.5CC/31GX5/16) 31G X 5/16" 0.5 ML MISC Inject 1 Units as directed 2 (two) times daily. 60 each 12  . LORazepam (ATIVAN) 1 MG tablet TAKE ONE TABLET BY MOUTH AT BEDTIME AS NEEDED FOR ANXIETY 30 tablet 2  . magnesium oxide (MAG-OX) 400 MG tablet Take by mouth.    . montelukast (SINGULAIR) 10 MG tablet Take 1 tablet (10 mg total) by mouth at bedtime. 30 tablet 3  . omeprazole (PRILOSEC) 40 MG capsule TAKE ONE (1) CAPSULE EACH DAY 30 capsule 12  . ramipril (ALTACE) 10 MG capsule Take 1 capsule (10 mg total) by mouth daily. 30 capsule 3  . sertraline (ZOLOFT) 50 MG tablet Take 1 tablet (50 mg total) by mouth daily. 30 tablet 3  . warfarin (COUMADIN) 1 MG tablet TAKE ONE TABLET BY MOUTH AT 6 P.M. 30 tablet 3  . warfarin (COUMADIN) 6 MG tablet Take 1 tablet (6 mg total) by mouth daily. 30 tablet 3   No current facility-administered medications for this visit.     Review of Systems Review of Systems  Constitutional: Negative.   Respiratory: Negative.   Cardiovascular: Negative.     Blood pressure (!) 150/76, pulse (!) 7, resp. rate 12, height  5\' 4"  (1.626 m), weight 209 lb (94.8 kg).  Physical Exam Physical Exam  Constitutional: She is oriented to person, place, and time. She appears well-developed and well-nourished.  Neurological: She is alert and oriented to person, place, and time.  Skin: Skin is warm and dry.   The previous inflamed cyst of the upper back shows complete resolution of infection. There is a 2 cm residual cyst palpated.   Data Reviewed Prior notes reviewed   Assessment    Skin cyst with recent infection     Plan    Procedure: cyst excision, upper back  Anesthetic: 1% xylocaine mixed with 0.5% marcaine, 10 ml  Prep: chloroprep  Description: after local anesthetic block, area was prepped and draped with sterile towels. A 2.5 cm vertical elliptical incision was made. The skin with the  underlying cyst was excised from the subcutaneous tissue. 2 bleeders required suture ligature with 3-0 vicryl. Other bleeders were cauterized. Excised tissue was sent to pathology. Wound was closed with 3 interrupted horizontal mattress sutures of 3-0 nylon. Neosporin ointment was applied to the wound. Dressed with Telfa gauze and Tegaderm.  Procedure was well tolerated, no immediate problems encountered.  Pt advised on wound care.  Rx given for Tramadol 50 mg #10.   Pt to return in 10-14 days for suture removal and wound assessment.   HPI, Physical Exam, Assessment and Plan have been scribed under the direction and in the presence of Mckinley Jewel, MD  Gaspar Cola, CMA    I have completed the exam and reviewed the above documentation for accuracy and completeness.  I agree with the above.  Haematologist has been used and any errors in dictation or transcription are unintentional.  Frazier Balfour G. Jamal Collin, M.D., F.A.C.S.     Junie Panning G 10/10/2016, 8:52 AM

## 2016-10-13 ENCOUNTER — Other Ambulatory Visit: Payer: Self-pay | Admitting: Unknown Physician Specialty

## 2016-10-13 DIAGNOSIS — E119 Type 2 diabetes mellitus without complications: Secondary | ICD-10-CM | POA: Diagnosis not present

## 2016-10-13 DIAGNOSIS — H2513 Age-related nuclear cataract, bilateral: Secondary | ICD-10-CM | POA: Diagnosis not present

## 2016-10-15 ENCOUNTER — Telehealth: Payer: Self-pay | Admitting: *Deleted

## 2016-10-15 NOTE — Telephone Encounter (Signed)
-----   Message from Christene Lye, MD sent at 10/14/2016  4:07 PM EDT ----- Benign cyst. Please notify pt.

## 2016-10-15 NOTE — Telephone Encounter (Signed)
Notified patient as instructed, patient pleased. Discussed follow-up appointments, patient agrees  

## 2016-10-22 ENCOUNTER — Ambulatory Visit (INDEPENDENT_AMBULATORY_CARE_PROVIDER_SITE_OTHER): Payer: Medicare HMO | Admitting: *Deleted

## 2016-10-22 DIAGNOSIS — L723 Sebaceous cyst: Secondary | ICD-10-CM

## 2016-10-22 NOTE — Progress Notes (Signed)
Patient came in today for a wound check.  The wound is clean, with no signs of infection noted. The sutures were removed and steri strips applied.  

## 2016-10-22 NOTE — Patient Instructions (Signed)
Return as scheduled 

## 2016-10-24 ENCOUNTER — Other Ambulatory Visit: Payer: Self-pay

## 2016-10-24 NOTE — Patient Outreach (Signed)
[facility] [facility]) Care Management  10/24/2016  Daisy Watkins [birth year] [id number]   Telephone Screen  Referral Date: 10/23/16 Referral Source: MD office(Daisy Julian Hy, NP) Referral Reason: "patient could benefit from help form a [facility] SW with medication assistance, low income housing, food stamps, assistance with clothing and paying for glasses" Insurance: [employer]   Outreach attempt # 1 to patient. Spoke with patient and screening completed.   Social: Patient reports that she is currently residing in the home of her son( [name],  [address]). She states she has bene staying there for at least the past six months. Patient reports that hs is trying to find a low income living apartment so she can moe out. She voices that she is independent with all ADLs/IADLs. She drives herself to medical appts. She denies any recent falls. DME in the home include cbg meter and BP machine. patient reports she needs assistance with obtaining food stamps, glasses and dentures.    Conditions: Patient has PMH of anxiety, DM, DVT, GERD, HLD, HTN, Insomnia and vitamin D and B12 deficiency. Patient states she has been unable to check her blood sugars for about a week now as she is out of test strips.She voices some issues/concerns with hypo/hyperglycemic events. Patient states she can tell and feels funny when her cbg gets too high or low. She states when she was able to check her blood sugars it would range in the 300's after meals. Most recent A1C was 7.6(May 2018). Patient has BP machine and is checking BP in the home and monitoring for BP issues,. She report she has had issues with recurrent cyst development on her back. She had cyst removed recently and went to see surgeon earlier this week to have staples removed.    Medications: Patient voices taking "about 6-7 meds." She reports that she is unable to afford her insulins as they are too expensive.    Appointments:  Patient saw PCP earlier this month as has appt on 10/31/16.   Advance Directives: Per MD notes patient has completed [facility] and delegated youngest dtr as Daisy Watkins POA. She also has DNR in the home.    Consent: [facility] services reviewed and discussed with patient. She gave verbal consent for services. Patient already has [facility] SW and Pharmacy referral pending.    Plan: RN CM will notify [facility] administrative assistant of case status.  RN CM will send referral to [facility] RN for further in home eval/assessment of care needs and management of chronic conditions.  Daisy Montgomery, RN,BSN,CCM [place] Management Telephonic Care Management Coordinator Direct Phone: [phone] Toll Free: [phone] Fax: [phone]

## 2016-10-24 NOTE — Addendum Note (Signed)
Addended by: Kelly Splinter on: 10/24/2016 10:29 AM   Modules accepted: Orders

## 2016-10-27 ENCOUNTER — Other Ambulatory Visit: Payer: Self-pay | Admitting: *Deleted

## 2016-10-27 NOTE — Patient Outreach (Signed)
[facility]) Care Management  10/27/2016  ANYAE GRIFFITH Jul 06, 1932 [id number]  Referral received from patient's providers office to assist with community resource needs related to financial assistance and housing. Phone call to patient, voicemail message left for a return call.   [name] [facility] Care Management [phone]

## 2016-10-28 ENCOUNTER — Other Ambulatory Visit: Payer: Self-pay | Admitting: *Deleted

## 2016-10-28 NOTE — Patient Outreach (Signed)
[facility]) Care Management  10/28/2016  NIMISHA RATHEL [birth year] [id number]   Referral received from patient's providers office to assist with community resource needs related to financial assistance and housing. Second phone call to patient, voicemail message left for a return call.    [name] [facility] Care Management [phone]

## 2016-10-28 NOTE — Patient Outreach (Signed)
[facility]) Care Management  10/28/2016  LELAR FAREWELL 07-31-1932 [id number]   Return phone call from patient. Per patient, she is currently residing with her son, but is wanting to move into affordable housing.   She has been in contact with [facility] in the past, however has not followed up with them in years. Patient further discussed  having difficulty affording her medicines and is having to re-use her insulin needles. This social worker explained [facility] care management services and recommended that a home visit be scheduled to complete a full assessment. Patient was not at home at the time of the call. This social worker agreed to contact her on 10/29/16 to schedule a home visit. This Education officer, museum also discussed referral being made to the [facility] pharmacist regarding patient's need for resources related to insulin needles.   [name] [facility] Care Management [phone]

## 2016-10-29 ENCOUNTER — Other Ambulatory Visit: Payer: Self-pay | Admitting: *Deleted

## 2016-10-29 NOTE — Patient Outreach (Signed)
[place] [facility]) Care Management  10/29/2016  JALEEYA MCNELLY 1932-11-15 [id number]   Phone call to patient to schedule initial home visit. Home visit scheduled for 11/05/16 at 12:00pm.  Patient confirmed new address of [place], [place].   [name] [facility] Care Management [phone]

## 2016-10-29 NOTE — Patient Outreach (Signed)
Successful telephone encounter to Daisy Watkins, 83 year old female - following up on referral received from [facility] telephonic RN CM 10/24/16 for Community CM services, MD referral Kathrine Haddock NP) 10/23/16.   Spoke with pt, HIPAA identifiers provided, discussed purpose of call - follow up on referral.   RN CM inquired of pt if obtained strips for glucometer yet to which  reports did not, try to get some today.  Pt reports on sugars, sometimes 300 after eating, depending on what I eat.   Pt reports had cyst on back removed 10/09/16, doing good.  Pt confirmed to see Kathrine Haddock 10/31/16, no problems with transportation, still drives.   RN CM discussed with pt doing a home visit to assess needs to which pt agreed.    Plan:  As discussed with pt, plan to follow up again next week for initial home visit.    Zara Chess.   [place] Care Management  [phone]

## 2016-10-29 NOTE — Patient Outreach (Signed)
Received a call from pt whom RN CM recently spoke with, pt  reports currently staying at son's home- address provided: [address] [place] [address] - RN CM to do initial home visit next week.   Zara Chess.   [place] Care Management  [phone]

## 2016-10-30 ENCOUNTER — Other Ambulatory Visit: Payer: Self-pay | Admitting: *Deleted

## 2016-10-30 NOTE — Patient Outreach (Addendum)
[facility]) Care Management  10/30/2016  Daisy Watkins 1932/11/17 [id number]   Phone call from [name] resident Director of [facility] to discuss application process. Per patient, she is interested in low income housing for the elderly. [name] discussed the application process, and the fact that there is a wait list. However she will email a application for patient to get started on as it is extensive. [name] also suggested obtaining applications from [employer] and [facility]. She also suggested additional independent living apartments such as the [place] apartments and [facility].  Plan: This social worker to address housing options during  initial home visit scheduled for 11/05/16.   [name] [facility] Care Management [phone]

## 2016-10-31 ENCOUNTER — Other Ambulatory Visit: Payer: Self-pay | Admitting: Pharmacist

## 2016-10-31 ENCOUNTER — Encounter: Payer: Medicare HMO | Admitting: Unknown Physician Specialty

## 2016-10-31 NOTE — Patient Outreach (Addendum)
[place] [facility]) Care Management  [place]   10/31/2016  MERICA PRELL [birth year] [id number]  83 year old female referred to [place] Management by Kathrine Haddock, NP.  [facility] pharmacy to address concerns regarding medication cost assistance with insulin and syringes.  PMHx includes, but not limited to, atrial fibrillation on chronic anticoagulation, CAD, DVT, HTN, HLD, GERD, type 2 diabetes, vitamin D and vitamin B-12 deficiencies, depression, and anxiety.  Noted patient recently had back cyst excision in which warfarin was held prior to procedure.     Unsuccessful telephone outreach call to patient today.  Left HIPAA compliant voicemail.    Plan: Call patient again next week regarding medication cost assistance needs.   Ralene Bathe, PharmD, [place] [phone]   Addendum:  Received incoming call from patient.  HIPAA identifiers verified. Patient agreeable to medication reconciliation telephonically.    Subjective:  Patient states she has a hard time paying for all of her medications.   She is not sure if she is in the coverage gap.  She estimates that she receives ~$900 / month from social security.    Objective:  Hemoglobin A1c = 7.6 (08/20/2016) SCr = 0.80 (05/06/16), estimated CrCl ~64 ml/min (using wt 94.1kg, rounded SCr 1 for age)   Medications Reviewed Today    Reviewed by Christene Lye, MD (Physician) on 10/10/16 at [id number]  Med List Status: <None>  Medication Order Taking? Sig Documenting Provider Last Dose Status Informant  amLODipine (NORVASC) 10 MG tablet [id number] Yes Take 1 tablet (10 mg total) by mouth daily. Guadalupe Maple, MD Taking Active   erythromycin [facility]) ophthalmic ointment [id number] Yes Place 1 application into the left eye at bedtime. Volney American, [place] Taking Active   glucose blood test strip [id number] Yes 1 each by Other route as needed for other. Check sugars TID, diagnosis  is E11.65. LON-99. [name], MD Taking Active   hydrochlorothiazide (HYDRODIURIL) 25 MG tablet [id number] Yes Take 1 tablet (25 mg total) by mouth daily. Guadalupe Maple, MD Taking Active   insulin NPH-regular Human (NOVOLIN 70/30) (70-30) 100 UNIT/ML injection [id number] Yes INCREASE AS DIRECTED TO 45 UNITS SUBCUTANEOUSLY EVERY MORNING WITH BREAKFAST AND 30 UNITS BEFORE EVENING MEAL OR AS DIRECTED Volney American, PA-C Taking Active   Insulin Syringe-Needle U-100 (B-D INS SYRINGE 0.5CC/31GX5/16) 31G X 5/16" 0.5 ML MISC [id number] Yes Inject 1 Units as directed 2 (two) times daily. Kathrine Haddock, NP Taking Active   LORazepam (ATIVAN) 1 MG tablet [id number] Yes TAKE ONE TABLET BY MOUTH AT BEDTIME AS NEEDED FOR ANXIETY Kathrine Haddock, NP Taking Active   magnesium oxide (MAG-OX) 400 MG tablet [id number] Yes Take by mouth. [provider] Taking Active   montelukast (SINGULAIR) 10 MG tablet [id number] Yes Take 1 tablet (10 mg total) by mouth at bedtime. Kathrine Haddock, NP Taking Active   omeprazole (PRILOSEC) 40 MG capsule [id number] Yes TAKE ONE (1) CAPSULE EACH DAY Crissman, Jeannette How, MD Taking Active   ramipril (ALTACE) 10 MG capsule [id number] Yes Take 1 capsule (10 mg total) by mouth daily. Guadalupe Maple, MD Taking Active   sertraline (ZOLOFT) 50 MG tablet [id number] Yes Take 1 tablet (50 mg total) by mouth daily. Guadalupe Maple, MD Taking Active   warfarin (COUMADIN) 1 MG tablet [id number] Yes TAKE ONE TABLET BY MOUTH AT 6 P.M. Park Liter P, DO Taking Active   warfarin (COUMADIN) 6 MG tablet [id number] Yes Take 1 tablet (6  mg total) by mouth daily. Guadalupe Maple, MD Taking Active           Assessment:   Drugs sorted by system:  Neurologic/Psychologic: Lorazepam  Cardiovascular: Amlodipine, hydrochlorothiazide, ramipril  Pulmonary/Allergy: Montelukast  Gastrointestinal: Omeprazole  Endocrine:Novolin 70/30  Topical: Erythromycin  ointment  Vitamins/Minerals:Magnesium  Miscellaneous:Warfarin  Medication cost assistance:   Based on patient's income estimate, she meets criteria for Extra Help / LIS application.  Completed application online with patient.  Patient will receive a letter in the mail in 2-4 weeks from the [place] office with application approval or denial.     [employer] for Novolin 70/30:   Requires Medicare Part D patients spend >$1000 on prescription medications in the current calendar year.  Per patient's pharmacy, TROOP = [phone].  Patient needs to spend an additional $234.75 before eligible to apply.    Patient meets income requirement for PAP   Based on co-pays from pharmacy, patient is not in coverage gap yet with insurance.  Her insulin is $47 / month and syringes are $2/ month.     Reviewed [employer] diabetes program with patient and provided phone number to call regarding her test strips, lancets, alcohol swaps, and meter.     Reviewed [employer] OTC catalogue with patient and provided phone number to call to order medications or request catalogue.    Other issues noted:   Patient states she stopped her sertraline on her own and that her provider is aware.  I removed sertraline from patient's medication list.   Plan: Route note to patient's PCP, Kathrine Haddock, NP.  Follow-up with patient in 2-4 weeks regarding her Extra Help / LIS application.  Patient aware she can also call me before then with any questions.    Ralene Bathe, PharmD, [place] [phone]

## 2016-10-31 NOTE — Addendum Note (Signed)
Addended by: Ralene Bathe E on: 10/31/2016 01:51 PM   Modules accepted: Orders

## 2016-11-03 ENCOUNTER — Ambulatory Visit: Payer: Self-pay | Admitting: Pharmacist

## 2016-11-05 ENCOUNTER — Other Ambulatory Visit: Payer: Self-pay | Admitting: *Deleted

## 2016-11-05 ENCOUNTER — Encounter: Payer: Self-pay | Admitting: *Deleted

## 2016-11-05 NOTE — Patient Outreach (Signed)
[place] [facility] [facility]) Care Management  [facility] Social Work  11/05/2016  Daisy Watkins July 17, 1932 [id number]  Subjective:  Patient is a 83 year old female. Per patient she has a eye refraction  scheduled for 11/17/16 to justify the need for cataract surgery.   Patient currently living with her son and daughter in law, for the last 2 years, however would like to live on her own. Low income apartment list reviewed and discussed. Patient also needs a mattress and box spring.  Current mattress visibly old and worn. Referral to the [place] discussed. Patient also discussed needing clothes, [employer] suggested for inexpensive clothing.  Objective:   Encounter Medications:  Outpatient Encounter Prescriptions as of 11/05/2016  Medication Sig Note  . amLODipine (NORVASC) 10 MG tablet Take 1 tablet (10 mg total) by mouth daily.   . Cholecalciferol (VITAMIN D3) 1000 units CAPS Take by mouth. Daily   . Cyanocobalamin (VITAMIN B-12) 5000 MCG SUBL Place under the tongue. Daily   . erythromycin [facility]) ophthalmic ointment Place 1 application into the left eye at bedtime.   . fluticasone (FLONASE) 50 MCG/ACT nasal spray Place into both nostrils daily.   Marland Kitchen glucose blood test strip 1 each by Other route as needed for other. Check sugars TID, diagnosis is E11.65. LON-99. 10/31/2016: Checks once daily  . hydrochlorothiazide (HYDRODIURIL) 25 MG tablet Take 1 tablet (25 mg total) by mouth daily.   . insulin NPH-regular Human (NOVOLIN 70/30) (70-30) 100 UNIT/ML injection INCREASE AS DIRECTED TO 45 UNITS SUBCUTANEOUSLY EVERY MORNING WITH BREAKFAST AND 30 UNITS BEFORE EVENING MEAL OR AS DIRECTED   . Insulin Syringe-Needle U-100 (B-D INS SYRINGE 0.5CC/31GX5/16) 31G X 5/16" 0.5 ML MISC Inject 1 Units as directed 2 (two) times daily.   Marland Kitchen LORazepam (ATIVAN) 1 MG tablet TAKE 1 TABLET AT BEDTIME AS NEEDED FOR ANXIETY 11/05/2016: As needed.   . magnesium oxide (MAG-OX) 400 MG tablet Take by  mouth daily.    . montelukast (SINGULAIR) 10 MG tablet Take 1 tablet (10 mg total) by mouth at bedtime. (Patient not taking: Reported on 11/05/2016)   . omeprazole (PRILOSEC) 40 MG capsule TAKE ONE (1) CAPSULE EACH DAY   . ramipril (ALTACE) 10 MG capsule Take 1 capsule (10 mg total) by mouth daily.   . vitamin C (ASCORBIC ACID) 500 MG tablet Take 500 mg by mouth daily.   Marland Kitchen warfarin (COUMADIN) 1 MG tablet TAKE ONE TABLET BY MOUTH AT 6 P.M.   . warfarin (COUMADIN) 6 MG tablet Take 1 tablet (6 mg total) by mouth daily.    No facility-administered encounter medications on file as of 11/05/2016.     Functional Status:  In your present state of health, do you have any difficulty performing the following activities: 11/05/2016 09/05/2016  Hearing? Y N  Comment per pt a little  -  Vision? Y N  Comment to have cataract removed this month- left eye  -  Difficulty concentrating or making decisions? N N  Walking or climbing stairs? N N  Dressing or bathing? N N  Doing errands, shopping? N N  Preparing Food and eating ? Y N  Using the Toilet? N N  In the past six months, have you accidently leaked urine? N N  Do you have problems with loss of bowel control? N N  Managing your Medications? N N  Managing your Finances? N N  Housekeeping or managing your Housekeeping? N N  Some recent data might be hidden  Fall/Depression Screening:  PHQ 2/9 Scores 11/05/2016 10/24/2016 09/05/2016 09/05/2016 04/02/2016 09/19/2015 09/21/2014  PHQ - 2 Score 0 0 0 0 1 6 2   PHQ- 9 Score - - - 3 - 17 -    Assessment:  Patient very pleasant and engaging. Discussed her limited income and need to identify affordable housing. Per patient, she is not in any harm, however just wants her own space. Patient agrees to referral to the [facility] to address need for mattress.  Patient also plans to visit the [employer] to purchase affordable clothing. Patient has affordable Housing list and plans to go through the list  for housing options. Per patient, she has a car and will be able to pick up applications as needed. Patient's eye refraction appointment discussed, patient is prepared to pay the $35.00 fee along with her co-pay to have this done, however per patient will not be able to afford anything over that price. Per patient, she will be sure to discuss her financial responsibility before any procedure takes place. Plan: This Education officer, museum will follow up with patient within 1 month to discuss housing options.   [name] [facility] Care Management [phone]

## 2016-11-05 NOTE — Patient Outreach (Signed)
[facility]) Care Management   11/05/2016  Daisy Watkins 12-29-1932 [id number]  Daisy Watkins is an 83 y.o. female  Subjective:  Pt reports did get glucometer strips, been checking sugars once a day, today sugar  189 ac.    Pt reports saw PCP last month, to follow up again 11/13/16.  Pt reports on cyst surgically  Removed on back 2-3 weeks ago, sore at times, no complaints today.    Pt reports has Advanced Directive papers (Living will, health care power of attorney) filled out, just needs to be notarized.  Objective:   Vitals:   11/05/16 1132  BP: (!) 148/68  Pulse: 67  Resp: 20    ROS  Physical Exam  Constitutional: She is oriented to person, place, and time. She appears well-developed and well-nourished.  Cardiovascular: Normal rate, regular rhythm and normal heart sounds.   Respiratory: Effort normal and breath sounds normal.  GI: Soft.  Musculoskeletal: Normal range of motion. She exhibits no edema.  Neurological: She is alert and oriented to person, place, and time.  Skin: Skin is warm and dry.  Psychiatric: She has a normal mood and affect. Her behavior is normal. Judgment and thought content normal.    Encounter Medications:   Outpatient Encounter Prescriptions as of 11/05/2016  Medication Sig Note  . amLODipine (NORVASC) 10 MG tablet Take 1 tablet (10 mg total) by mouth daily.   . Cholecalciferol (VITAMIN D3) 1000 units CAPS Take by mouth. Daily   . Cyanocobalamin (VITAMIN B-12) 5000 MCG SUBL Place under the tongue. Daily   . erythromycin [facility]) ophthalmic ointment Place 1 application into the left eye at bedtime.   . fluticasone (FLONASE) 50 MCG/ACT nasal spray Place into both nostrils daily.   Marland Kitchen glucose blood test strip 1 each by Other route as needed for other. Check sugars TID, diagnosis is E11.65. LON-99. 10/31/2016: Checks once daily  . hydrochlorothiazide (HYDRODIURIL) 25 MG tablet Take 1 tablet (25 mg total) by mouth daily.   . insulin  NPH-regular Human (NOVOLIN 70/30) (70-30) 100 UNIT/ML injection INCREASE AS DIRECTED TO 45 UNITS SUBCUTANEOUSLY EVERY MORNING WITH BREAKFAST AND 30 UNITS BEFORE EVENING MEAL OR AS DIRECTED   . Insulin Syringe-Needle U-100 (B-D INS SYRINGE 0.5CC/31GX5/16) 31G X 5/16" 0.5 ML MISC Inject 1 Units as directed 2 (two) times daily.   Marland Kitchen LORazepam (ATIVAN) 1 MG tablet TAKE 1 TABLET AT BEDTIME AS NEEDED FOR ANXIETY 11/05/2016: As needed.   Marland Kitchen omeprazole (PRILOSEC) 40 MG capsule TAKE ONE (1) CAPSULE EACH DAY   . ramipril (ALTACE) 10 MG capsule Take 1 capsule (10 mg total) by mouth daily.   . vitamin C (ASCORBIC ACID) 500 MG tablet Take 500 mg by mouth daily.   Marland Kitchen warfarin (COUMADIN) 1 MG tablet TAKE ONE TABLET BY MOUTH AT 6 P.M.   . warfarin (COUMADIN) 6 MG tablet Take 1 tablet (6 mg total) by mouth daily.   . magnesium oxide (MAG-OX) 400 MG tablet Take by mouth daily.    . montelukast (SINGULAIR) 10 MG tablet Take 1 tablet (10 mg total) by mouth at bedtime. (Patient not taking: Reported on 11/05/2016)    No facility-administered encounter medications on file as of 11/05/2016.     Functional Status:   In your present state of health, do you have any difficulty performing the following activities: 11/05/2016 09/05/2016  Hearing? Y N  Comment per pt a little  -  Vision? Y N  Comment to have cataract removed this month-  left eye  -  Difficulty concentrating or making decisions? N N  Walking or climbing stairs? N N  Dressing or bathing? N N  Doing errands, shopping? N N  Preparing Food and eating ? Y N  Using the Toilet? N N  In the past six months, have you accidently leaked urine? N N  Do you have problems with loss of bowel control? N N  Managing your Medications? N N  Managing your Finances? N N  Housekeeping or managing your Housekeeping? N N  Some recent data might be hidden    Fall/Depression Screening:    Fall Risk  11/05/2016 10/24/2016 09/05/2016  Falls in the past year? Yes No Yes  Number falls in  past yr: 1 - 1  Injury with Fall? No - No  Follow up - - -   PHQ 2/9 Scores 11/05/2016 10/24/2016 09/05/2016 09/05/2016 04/02/2016 09/19/2015 09/21/2014  PHQ - 2 Score 0 0 0 0 1 6 2   PHQ- 9 Score - - - 3 - 17 -    Assessment:  Pleasant 83 year old female, currently staying with son/daughter in law, wants to get her    Own place.   [facility] LCSW Chrystal arrived during home visit.     Lungs clear, no complaints of pain, sob, chest pain, no edema noted.    Diabetes: review of pt's glucometer readings- 3 recent sugars: am 189 today and  yesterday 147, 11/03/16-         211 at night.  Emmi information (Diabetes diet, Diabetes- controlling blood sugar) provided/reviewed.   Hypertension:  BP today 148/68 (pt took am medications approximately 3.5 hours earlier).  Pill planner          Provided.                 Plan:  As discussed with pt, plan to continue to provide Community CM services, follow up again next                Month- home visit.             Plan to send Kathrine Haddock NP  by in basket pt's 11/05/16 home visit encounter.    THN CM Care Plan Problem One     Most Recent Value  Care Plan Problem One  Diabetes- elevated sugars/self management   Role Documenting the Problem One  Care Management Coordinator  Care Plan for Problem One  Active  THN Long Term Goal   Pt would see a one point decrease in her A1C in the next 65 days   THN Long Term Goal Start Date  11/05/16  Interventions for Problem One Long Term Goal  Reviewed with pt recent A1C -7.6, importance of exercise, diet, medication in managing her diabetes.   [facility]N CM Short Term Goal #1   Pt would have better adherence with diabetic diet in the next 30 days   THN CM Short Term Goal #1 Start Date  11/05/16  Interventions for Short Term Goal #1  Provided/reviewed with pt Emmi Diabetes diet- portion sizes, small frequent meals.  also discussed including protein with all of her meals/snacks.   [facility] CM Short Term Goal #2   Pt would check sugars daily for  the next 30 days, bring glucometer results to upcoming PCP visit.   THN CM Short Term Goal #2 Start Date  11/05/16  Interventions for Short Term Goal #2  Discussed with pt importance of checking sugars daily as review of  glucometer, started back  checking 3 days ago, bring results to upcoming PCP visit.      Zara Chess.   [place] Care Management  [phone]

## 2016-11-06 NOTE — Patient Outreach (Signed)
Request received from Chrystal Land, LCSW to mail patient personal care resources.  Information mailed today. 

## 2016-11-12 ENCOUNTER — Ambulatory Visit (INDEPENDENT_AMBULATORY_CARE_PROVIDER_SITE_OTHER): Payer: Medicare HMO | Admitting: Unknown Physician Specialty

## 2016-11-12 ENCOUNTER — Encounter: Payer: Self-pay | Admitting: Unknown Physician Specialty

## 2016-11-12 VITALS — BP 137/74 | HR 76 | Temp 98.1°F | Ht 63.8 in | Wt 204.2 lb

## 2016-11-12 DIAGNOSIS — E7800 Pure hypercholesterolemia, unspecified: Secondary | ICD-10-CM | POA: Diagnosis not present

## 2016-11-12 DIAGNOSIS — F5101 Primary insomnia: Secondary | ICD-10-CM | POA: Diagnosis not present

## 2016-11-12 DIAGNOSIS — I480 Paroxysmal atrial fibrillation: Secondary | ICD-10-CM | POA: Diagnosis not present

## 2016-11-12 DIAGNOSIS — Z794 Long term (current) use of insulin: Secondary | ICD-10-CM | POA: Diagnosis not present

## 2016-11-12 DIAGNOSIS — I10 Essential (primary) hypertension: Secondary | ICD-10-CM | POA: Diagnosis not present

## 2016-11-12 DIAGNOSIS — Z5181 Encounter for therapeutic drug level monitoring: Secondary | ICD-10-CM

## 2016-11-12 DIAGNOSIS — R350 Frequency of micturition: Secondary | ICD-10-CM | POA: Insufficient documentation

## 2016-11-12 DIAGNOSIS — Z7901 Long term (current) use of anticoagulants: Secondary | ICD-10-CM

## 2016-11-12 DIAGNOSIS — E1165 Type 2 diabetes mellitus with hyperglycemia: Secondary | ICD-10-CM

## 2016-11-12 DIAGNOSIS — I2510 Atherosclerotic heart disease of native coronary artery without angina pectoris: Secondary | ICD-10-CM

## 2016-11-12 DIAGNOSIS — IMO0001 Reserved for inherently not codable concepts without codable children: Secondary | ICD-10-CM

## 2016-11-12 DIAGNOSIS — Z0000 Encounter for general adult medical examination without abnormal findings: Secondary | ICD-10-CM

## 2016-11-12 DIAGNOSIS — G4700 Insomnia, unspecified: Secondary | ICD-10-CM | POA: Insufficient documentation

## 2016-11-12 HISTORY — DX: Frequency of micturition: R35.0

## 2016-11-12 NOTE — Assessment & Plan Note (Signed)
Takes Ativan nightly.  Consider sleep study.  Discussed decreasing dependence on that medication.  She did cut back from 3/day

## 2016-11-12 NOTE — Assessment & Plan Note (Signed)
Stable, continue present medications.   

## 2016-11-12 NOTE — Assessment & Plan Note (Signed)
Hgb A1C is 8.3%   Not to goal but don't want below 7% for this older patient.  Increasing insulin increases risk of falls and on coumadin.  She will work on diet

## 2016-11-12 NOTE — Assessment & Plan Note (Addendum)
Not sure what her statin is.  She will call to update

## 2016-11-12 NOTE — Progress Notes (Signed)
BP 137/74   Pulse 76   Temp 98.1 F (36.7 C)   Ht 5' 3.8" (1.621 m)   Wt 204 lb 3.2 oz (92.6 kg)   LMP  (LMP Unknown)   SpO2 96%   BMI 35.27 kg/m    Subjective:    Patient ID: Daisy Watkins, female    DOB: 30-Nov-1932, 83 y.o.   MRN: [id number]  HPI: Daisy Watkins is a 83 y.o. female  Chief Complaint  Patient presents with  . Annual Exam    pt had wellness exam 09/05/16 with [facility]  . Diabetes  . Hypertension   Coumadin She is taking 7 mg  Pt is here for a 1week INR check. Last INR was  2.3 No new bruising or bleeding.  Diet without changes.  No new medications.  Relevant past medical, surgical, family and social history reviewed and updated as indicated. Interim medical history since our last visit reviewed. Allergies and medications reviewed and updated.  Diabetes: Using medications without difficulties.  Takes 45 u in the AM and 30 in the evening No hypoglycemic episodes No hyperglycemic episodes Feet problems: Blood Sugars averaging: 102-146 eye exam within last year Last Hgb A1C:8.0  Hypertension  Using medications without difficulty Average home BPs Not checking   Using medication without problems or lightheadedness No chest pain with exertion or shortness of breath No Edema  Elevated Cholesterol Using medications without problems No Muscle aches  Diet: Exercise: States she eats well and walks  Insomnia Takes on Lorazepam QHS to "rest and sleep."    Past Surgical History:  Procedure Laterality Date  . ABDOMINAL HYSTERECTOMY     Past Medical History:  Diagnosis Date  . Anxiety   . Diabetes mellitus without complication ([place])   . DVT (deep venous thrombosis) ([place]) 2006   chronic anticoagulation  . GERD (gastroesophageal reflux disease)   . Hyperlipidemia   . Hypertension   . Insomnia   . Vitamin B12 deficiency   . Vitamin D deficiency disease    Family History  Problem Relation Age of Onset  . Diabetes Maternal Grandmother   .  Hypertension Mother   . Cancer Daughter        breast  . Heart disease Son   . Heart disease Son    Social History   Social History  . Marital status: Divorced    Spouse name: N/A  . Number of children: N/A  . Years of education: N/A   Occupational History  . Not on file.   Social History Main Topics  . Smoking status: Former Smoker    Packs/day: 0.50    Years: 30.00    Types: Cigarettes    Quit date: 03/31/1977  . Smokeless tobacco: Never Used  . Alcohol use No     Comment: rare  . Drug use: No  . Sexual activity: No   Other Topics Concern  . Not on file   Social History Narrative  . No narrative on file     Review of Systems  Constitutional: Negative.   HENT:       Ears itching  Eyes: Negative.   Respiratory: Negative.   Cardiovascular: Negative.   Gastrointestinal: Negative.   Endocrine: Negative.   Genitourinary: Positive for frequency.  Musculoskeletal: Negative.   Skin:       Cyst removed from back recently  Allergic/Immunologic: Negative.   Neurological: Negative.   Hematological: Negative.   Psychiatric/Behavioral: Negative.     Per HPI unless specifically  indicated above     Objective:    BP 137/74   Pulse 76   Temp 98.1 F (36.7 C)   Ht 5' 3.8" (1.621 m)   Wt 204 lb 3.2 oz (92.6 kg)   LMP  (LMP Unknown)   SpO2 96%   BMI 35.27 kg/m   Wt Readings from Last 3 Encounters:  11/12/16 204 lb 3.2 oz (92.6 kg)  11/05/16 209 lb (94.8 kg)  10/09/16 209 lb (94.8 kg)    Physical Exam  Constitutional: She is oriented to person, place, and time. She appears well-developed and well-nourished.  HENT:  Head: Normocephalic and atraumatic.  Eyes: Pupils are equal, round, and reactive to light. Right eye exhibits no discharge. Left eye exhibits no discharge. No scleral icterus.  Neck: Normal range of motion. Neck supple. Carotid bruit is not present. No thyromegaly present.  Cardiovascular: Normal rate and normal heart sounds.  An irregular rhythm  present. Exam reveals no gallop and no friction rub.   No murmur heard. Pulmonary/Chest: Effort normal and breath sounds normal. No respiratory distress. She has no wheezes. She has no rales.  Abdominal: Soft. Bowel sounds are normal. There is no tenderness. There is no rebound.  Genitourinary: No breast swelling, tenderness or discharge.  Musculoskeletal: Normal range of motion.  Lymphadenopathy:    She has no cervical adenopathy.  Neurological: She is alert and oriented to person, place, and time.  Skin: Skin is warm, dry and intact. No rash noted.  Psychiatric: She has a normal mood and affect. Her speech is normal and behavior is normal. Judgment and thought content normal. Cognition and memory are normal.    Results for orders placed or performed in visit on 09/30/16  CoaguChek XS/INR Waived  Result Value Ref Range   INR 3.1 (H) 0.9 - 1.1   Prothrombin Time 37.6 sec      Assessment & Plan:   Problem List Items Addressed This Visit      Unprioritized   Benign hypertension (Chronic)    Stable, continue present medications.        Relevant Orders   Comprehensive metabolic panel   Coronary artery disease (Chronic)    Check lipid panel for goal LDL of less than 70      Diabetes mellitus type 2, uncontrolled, without complications (HCC) - Primary (Chronic)    Hgb A1C is 8.3%   Not to goal but don't want below 7% for this older patient.  Increasing insulin increases risk of falls and on coumadin.  She will work on diet      Relevant Orders   Bayer DCA Hb A1c Waived   Hyperlipidemia (Chronic)    Not sure what her statin is.  She will call to update       Relevant Orders   Lipid Panel w/o Chol/HDL Ratio   Insomnia    Takes Ativan nightly.  Consider sleep study.  Discussed decreasing dependence on that medication.  She did cut back from 3/day      Monitoring for anticoagulant use (Chronic)    INR is 2.2.  Continue present dose      Paroxysmal atrial fibrillation  (HCC)   Relevant Orders   CoaguChek XS/INR Waived   Urinary frequency   Relevant Orders   UA/M w/rflx Culture, Routine    Other Visit Diagnoses    Annual physical exam           Follow up plan: Return in about 4 weeks (around 12/10/2016) for 4  weeks for INR and 3 months for DM.

## 2016-11-12 NOTE — Assessment & Plan Note (Signed)
Check lipid panel for goal LDL of less than 70

## 2016-11-12 NOTE — Assessment & Plan Note (Signed)
INR is 2.2.  Continue present dose

## 2016-11-13 LAB — COMPREHENSIVE METABOLIC PANEL
A/G RATIO: 2 (ref 1.2–2.2)
ALK PHOS: 110 IU/L (ref 39–117)
ALT: 20 IU/L (ref 0–32)
AST: 19 IU/L (ref 0–40)
Albumin: 4.3 g/dL (ref 3.5–4.7)
BILIRUBIN TOTAL: 0.6 mg/dL (ref 0.0–1.2)
BUN/Creatinine Ratio: 13 (ref 12–28)
BUN: 9 mg/dL (ref 8–27)
CHLORIDE: 101 mmol/L (ref 96–106)
CO2: 25 mmol/L (ref 20–29)
Calcium: 9.6 mg/dL (ref 8.7–10.3)
Creatinine, Ser: 0.68 mg/dL (ref 0.57–1.00)
GFR calc non Af Amer: 82 mL/min/{1.73_m2} (ref >59)
GFR, EST AFRICAN AMERICAN: 94 mL/min/{1.73_m2} (ref >59)
Globulin, Total: 2.2 g/dL (ref 1.5–4.5)
Glucose: 154 mg/dL — ABNORMAL HIGH (ref 65–99)
POTASSIUM: 4.3 mmol/L (ref 3.5–5.2)
Sodium: 140 mmol/L (ref 134–144)
TOTAL PROTEIN: 6.5 g/dL (ref 6.0–8.5)

## 2016-11-13 LAB — LIPID PANEL W/O CHOL/HDL RATIO
Cholesterol, Total: 265 mg/dL — ABNORMAL HIGH (ref 100–199)
HDL: 54 mg/dL (ref >39)
LDL Calculated: 168 mg/dL — ABNORMAL HIGH (ref 0–99)
Triglycerides: 215 mg/dL — ABNORMAL HIGH (ref 0–149)
VLDL Cholesterol Cal: 43 mg/dL — ABNORMAL HIGH (ref 5–40)

## 2016-11-13 LAB — COAGUCHEK XS/INR WAIVED
INR: 2.2 — ABNORMAL HIGH (ref 0.9–1.1)
Prothrombin Time: 26.3 s

## 2016-11-13 LAB — BAYER DCA HB A1C WAIVED: HB A1C (BAYER DCA - WAIVED): 8.3 % — ABNORMAL HIGH (ref <7.0)

## 2016-11-17 ENCOUNTER — Other Ambulatory Visit: Payer: Self-pay | Admitting: Unknown Physician Specialty

## 2016-11-17 LAB — MICROSCOPIC EXAMINATION: BACTERIA UA: NONE SEEN

## 2016-11-17 LAB — UA/M W/RFLX CULTURE, ROUTINE
Bilirubin, UA: NEGATIVE
Ketones, UA: NEGATIVE
NITRITE UA: NEGATIVE
PH UA: 6 (ref 5.0–7.5)
PROTEIN UA: NEGATIVE
RBC UA: NEGATIVE
Specific Gravity, UA: 1.01 (ref 1.005–1.030)
UUROB: 4 mg/dL — AB (ref 0.2–1.0)

## 2016-11-17 LAB — URINE CULTURE, REFLEX

## 2016-11-17 MED ORDER — ATORVASTATIN CALCIUM 20 MG PO TABS: 20.0000 mg | ORAL_TABLET | Freq: Every day | ORAL | 3 refills | Status: DC

## 2016-11-25 ENCOUNTER — Telehealth: Payer: Self-pay | Admitting: Pharmacist

## 2016-11-25 ENCOUNTER — Ambulatory Visit: Payer: Self-pay | Admitting: Pharmacist

## 2016-11-25 NOTE — Patient Outreach (Signed)
[facility]) Care Management  11/25/2016  Daisy Watkins 1932-06-03 [id number]   83 year old female referred to [facility] by Kathrine Haddock, NP.  [facility] pharmacy services requested for medication cost assistance with insulin and syringes.    During telephonic visit on 10/31/2016, I assisted [name] with applying for Extra Help / LIS.  She had not met [employer] yet for PAP.    Unsuccessful outreach attempt to patient this morning.  I left a HIPAA compliant voicemail.   Plan Call patient again next week regarding medication assistance.   Ralene Bathe, PharmD, [place] [phone]    Addendum: I received a phone-call back from [name] and returned her call.  HIPAA identifiers verified.  [name] states she has not received a letter yet from social security regarding her Extra Help application.  She reports her pharmacist told her her Bureau had not changed much since early August.   Plan: I will follow-up with patient at the end of next week in regards to Extra Help application.   Ralene Bathe, PharmD, [place] [phone]

## 2016-12-02 ENCOUNTER — Ambulatory Visit: Payer: Self-pay | Admitting: *Deleted

## 2016-12-04 ENCOUNTER — Other Ambulatory Visit: Payer: Self-pay | Admitting: *Deleted

## 2016-12-04 ENCOUNTER — Other Ambulatory Visit: Payer: Self-pay | Admitting: Licensed Clinical Social Worker

## 2016-12-04 NOTE — Patient Outreach (Addendum)
Unsuccessful phone encounter to Daisy Watkins, 83 year old female as this RN CM was informed by Daisy Augusta LCSW that pt needed  to cancel  home visit with RN CM next week/has MD appointment.  Call today was an attempt to reschedule visit.   HIPAA compliant voice message left with contact name and number.    Plan:  If no response to voice message left, plan to follow up again next week telephonically.     Daisy Watkins.   [facility]  [phone]

## 2016-12-04 NOTE — Patient Outreach (Signed)
[facility]) Care Management  12/04/2016  Daisy Watkins 1932-06-05 [id number]  Assessment- CSW completed outreach call to patient to follow up on social work needs. CSW was able to reach patient successfully. HIPPA verifications provided. CSW introduced self and reason for call. CSW explained that this CSW is providing coverage for CSW [facility] and she wanted to follow up on social work needs. Patient reports having all the community resource information that she needs. CSW completed review of available housing resources. Patient reports that she is not currently working on moving at this time and will do so "in my own time." She confirms that she is living with her son and daughter in law and laughed stating "they don't want me to leave but I want my own space." CSW Chrystal Land placed referral to [employer] in order to assist patient with getting a new mattress and box spring. Patient confirmed that she did hear back from them and received paperwork that she needs to complete in order to gain furniture. She shares that she has not had time to complete paperwork yet but feels comfortable doing it herself. CSW reviewed clothing resources within the area. Patient reports that she will pursue this as well when she has the time. Patient declined needing any further social work assistance at this time and is agreeable to social work discharge.   Patient reports that she needs to cancel scheduled home visit for 12/08/16 with [facility]. She reports that she has a physician appointment and will not be able to do home visit and questioned if CSW could inform RNCM. CSW agreeable to do this. CSW informed patient that RNCM will be in touch to reschedule home visit.  CSW sent in basket message to [place] and Pharmacist notifying them of social work discharge.  Plan-CSW will complete social work discharge at this time. CSW will update PCP.  Eula Fried, BSW,  MSW, [email or web address] Phone: [phone] Fax: [phone]

## 2016-12-05 ENCOUNTER — Telehealth: Payer: Self-pay | Admitting: Pharmacist

## 2016-12-05 ENCOUNTER — Ambulatory Visit: Payer: Self-pay | Admitting: Pharmacist

## 2016-12-05 NOTE — Patient Outreach (Signed)
[facility]) Care Management  12/05/2016  Daisy Watkins [birth year] [id number]  Successful telephone call to [name] today to follow up on her Extra Help application. HIPAA identifiers verified. She is staying with a son in [place] currently but has her mail forwarded to another son in [place].  [name] reports she has not received a letter from social security regarding Extra Help application yet at either address.     I checked patient's enrollment on Medicare.gov website using [name]'s Medicare A/B card information however was unable to access her account.  I recommended to [name] that she either call social security or go to a local social security office to inquire about her application and to verify her Medicare A/B card information, specifically her zipcode on file.  We also discussed another way she may find out if she is approved is to check her prescription co-pay amounts as Extra Help co-pays should be $8.35 brand and $3.35 generic.  [name] will need to refill her insulin next week and will check her co-pay at that time.   Plan: I will follow-up with [name] in 2 weeks regarding her Extra Help application.   Ralene Bathe, PharmD, [place] [phone]

## 2016-12-08 ENCOUNTER — Ambulatory Visit: Payer: Self-pay | Admitting: *Deleted

## 2016-12-10 ENCOUNTER — Other Ambulatory Visit: Payer: Self-pay | Admitting: Unknown Physician Specialty

## 2016-12-11 NOTE — Telephone Encounter (Signed)
Per refills, she should not be due until 9/17. Will wait for Malachy Mood to return on Monday

## 2016-12-12 ENCOUNTER — Ambulatory Visit: Payer: Self-pay | Admitting: *Deleted

## 2016-12-15 ENCOUNTER — Ambulatory Visit: Payer: Self-pay | Admitting: *Deleted

## 2016-12-15 ENCOUNTER — Other Ambulatory Visit: Payer: Self-pay | Admitting: *Deleted

## 2016-12-15 ENCOUNTER — Ambulatory Visit: Payer: Medicare HMO | Admitting: Unknown Physician Specialty
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
## 2016-12-16 ENCOUNTER — Ambulatory Visit (INDEPENDENT_AMBULATORY_CARE_PROVIDER_SITE_OTHER): Payer: Medicare HMO | Admitting: Unknown Physician Specialty

## 2016-12-16 ENCOUNTER — Encounter: Payer: Self-pay | Admitting: Unknown Physician Specialty

## 2016-12-16 VITALS — BP 125/76 | HR 60 | Temp 98.2°F | Wt 202.6 lb

## 2016-12-16 DIAGNOSIS — Z7901 Long term (current) use of anticoagulants: Secondary | ICD-10-CM

## 2016-12-16 DIAGNOSIS — I480 Paroxysmal atrial fibrillation: Secondary | ICD-10-CM | POA: Diagnosis not present

## 2016-12-16 DIAGNOSIS — M7521 Bicipital tendinitis, right shoulder: Secondary | ICD-10-CM | POA: Diagnosis not present

## 2016-12-16 DIAGNOSIS — Z5181 Encounter for therapeutic drug level monitoring: Secondary | ICD-10-CM | POA: Diagnosis not present

## 2016-12-16 HISTORY — DX: Encounter for therapeutic drug level monitoring: Z51.81

## 2016-12-16 LAB — COAGUCHEK XS/INR WAIVED
INR: 2.6 — AB (ref 0.9–1.1)
Prothrombin Time: 31.2 s

## 2016-12-16 NOTE — Patient Instructions (Addendum)
Biceps Tendon Tendinitis (Distal) Rehab Ask your health care provider which exercises are safe for you. Do exercises exactly as told by your health care provider and adjust them as directed. It is normal to feel mild stretching, pulling, tightness, or discomfort as you do these exercises, but you should stop right away if you feel sudden pain or your pain gets worse.Do not begin these exercises until told by your health care provider. Stretching and range of motion exercises These exercises warm up your muscles and joints and improve the movement and flexibility of your arm. These exercises can also help to relieve pain and stiffness. Exercise A: Supination, active-assisted 1. Stand or sit with your left / right elbow bent to an "L" shape (90 degrees). 2. Rotate your palm up until you cannot rotate it anymore. Then, use your other hand to help rotate your left / right forearm more. 3. Hold this position for __________ seconds. 4. Slowly return to the starting position. Repeat __________ times. Complete this exercise __________ times a day. Exercise B: Pronation, active-assisted  1. Stand or sit with your left / right elbow bent to an "L" shape (90 degrees). 2. Rotate your left / right palm down until you cannot rotate it anymore. Then, use your other hand to help rotate your left / right forearm more. 3. Hold this position for __________ seconds. 4. Slowly return to the starting position. Repeat __________ times. Complete this exercise __________ times a day. Strengthening exercises These exercises build strength and endurance in your arm and shoulder. Endurance is the ability to use your muscles for a long time, even after your muscles get tired. Exercise C: Supination  1. Sit with your left / right forearm supported on a table. Your elbow should be at waist height. 2. Rest your hand over the edge of the table, palm-down. 3. Gently grasp a lightweight hammer near the head. As this exercise  gets easier for you, try holding the hammer farther down the handle. 4. Without moving your elbow, slowly rotate your palm up so it faces the ceiling. 5. Hold this position for__________ seconds. 6. Slowly return to the starting position. Repeat __________ times. Complete this exercise __________ times a day. Exercise D: Pronation  1. Sit with your left / right forearm supported on a table. Your elbow should be at waist height. 2. Rest your hand over the edge of the table, palm-up. 3. Gently grasp a lightweight hammer near the head. As this exercise gets easier for you, try holding the hammer farther down the handle. 4. Without moving your elbow, slowly rotate your palm down. 5. Hold this position for __________ seconds. 6. Slowly return to the starting position. Repeat __________ times. Complete this exercise __________ times a day. Exercise E: Elbow flexion, supinated  1. Sit on a stable chair without armrests, or stand. 2. If directed, hold a __________ weight in your left / right hand, or hold an exercise band with both hands. Your palms should face up toward the ceiling at the starting position. 3. Bend your left / right elbow and move your hand up toward your shoulder. Keep your other arm straight down, in the starting position. 4. Slowly return to the starting position. Repeat __________ times. Complete this exercise __________ times a day. Exercise F: Elbow extension  1. Lie on your back. 2. Hold a __________ weight in your left / right hand. 3. Bend your left / right elbow to an "L" shape (90 degrees) so your elbow is   pointed up to the ceiling and the weight is overhead. 4. Straighten your elbow, raising your hand toward the ceiling. Use your other hand to support your left / right upper arm and to keep it still. 5. Slowly return to the starting position. Repeat __________ times. Complete this exercise __________ times a day. This information is not intended to replace advice  given to you by your health care provider. Make sure you discuss any questions you have with your health care provider. Document Released: 03/17/2005 Document Revised: 11/22/2015 Document Reviewed: 02/23/2015 Elsevier Interactive Patient Education  2018 [employer].  Biceps Tendon Tendinitis (Distal) Distal biceps tendon tendinitis is inflammation of the distal biceps tendon. The distal biceps tendon is a strong cord of tissue that connects the biceps muscle, on the front of the upper arm, to a bone (radius) in the elbow. Distal biceps tendon tendinitis can interfere with the ability to bend the elbow and turn the hand palm-up (supination). This condition is usually caused by overusing the elbow joint and the biceps muscle, and it usually heals within 6 weeks. Distal biceps tendon tendinitis may include a grade 1 or grade 2 strain of the tendon. A grade 1 strain is mild, and it involves a slight pull of the tendon without any stretching or noticeable tearing of the tendon. There is usually no loss of biceps muscle strength. A grade 2 strain is moderate, and it involves a small tear in the tendon. The tendon is stretched, and biceps muscle strength is usually decreased. What are the causes? This condition may be caused by:  A sudden increase in the frequency or intensity of activity that involves the elbow and the biceps muscle.  Overuse of the biceps muscle. This can happen when you do the same movements over and over, such as: ? Supination. ? Forceful straightening (hyperextension) of the elbow. ? Bending of the elbow.  A direct, forceful hit or injury (trauma) to the elbow. This is rare.  What increases the risk? The following factors may make you more likely to develop this condition:  Playing contact sports.  Playing sports that involve throwing and overhead movements, including racket sports, gymnastics, weight lifting, or bodybuilding.  Doing physical labor.  Having poor strength  and flexibility of the arm and shoulder.  Having injured other parts of the elbow.  What are the signs or symptoms? Symptoms of this condition may include:  Pain and inflammation in the front of the elbow. Pain may get worse during certain movements, such as: ? Supination. ? Bending the elbow. ? Lifting or carrying objects. ? Throwing.  A feeling of warmth in the front of the elbow.  A crackling sound (crepitation) when you move or touch the elbow or the upper arm.  In some cases, symptoms may return (recur) after treatment, and they may be long-lasting (chronic). How is this diagnosed? This condition is diagnosed based on your symptoms, your medical history, and a physical exam. You may have tests, including X-rays or MRIs. Your health care provider may test your range of motion by having you do arm movements. How is this treated? This condition is treated by resting and icing the injured area, and by doing physical therapy exercises. Depending on the severity of your condition, treatment may also include:  Medicines to help relieve pain and inflammation.  Ultrasound therapy. This is the application of sound waves to the injured area.  Follow these instructions at home: Managing pain, stiffness, and swelling  If directed, put  ice on the injured area: ? Put ice in a plastic bag. ? Place a towel between your skin and the bag. ? Leave the ice on for 20 minutes, 2-3 times a day.  Move your fingers often to avoid stiffness and to lessen swelling.  Raise (elevate) the injured area above the level of your heart while you are sitting or lying down.  If directed, apply heat to the affected area before you exercise. Use the heat source that your health care provider recommends, such as a moist heat pack or a heating pad. ? Place a towel between your skin and the heat source. ? Leave the heat on for 20-30 minutes. ? Remove the heat if your skin turns bright red. This is especially  important if you are unable to feel pain, heat, or cold. You may have a greater risk of getting burned. Activity  Return to your normal activities as told by your health care provider. Ask your health care provider what activities are safe for you.  Do not lift anything that is heavier than 10 lb (4.5 kg) until your health care provider tells you that it is safe.  Avoid activities that cause pain or make your condition worse.  Do exercises as told by your health care provider. General instructions  Take over-the-counter and prescription medicines only as told by your health care provider.  Do not drive or operate heavy machinery while taking prescription pain medicines.  Keep all follow-up visits as told by your health care provider. This is important. How is this prevented?  Warm up and stretch before being active.  Cool down and stretch after being active.  Give your body time to rest between periods of activity.  Make sure to use equipment that fits you.  Be safe and responsible while being active to avoid falls.  Do at least 150 minutes of moderate-intensity exercise each week, such as brisk walking or water aerobics.  Maintain physical fitness, including: ? Strength. ? Flexibility. ? Cardiovascular fitness. ? Endurance. Contact a health care provider if:  You have symptoms that get worse or do not get better after 2 weeks of treatment.  You develop new symptoms. Get help right away if:  You develop severe pain. This information is not intended to replace advice given to you by your health care provider. Make sure you discuss any questions you have with your health care provider. Document Released: 03/17/2005 Document Revised: 11/22/2015 Document Reviewed: 02/23/2015 Elsevier Interactive Patient Education  [employer].

## 2016-12-16 NOTE — Progress Notes (Signed)
BP 125/76 (BP Location: Left Arm, Cuff Size: Normal)   Pulse 60   Temp 98.2 F (36.8 C)   Wt 202 lb 9.6 oz (91.9 kg)   LMP  (LMP Unknown)   SpO2 96%   BMI 34.99 kg/m    Subjective:    Patient ID: Daisy Watkins, female    DOB: [birth year], 83 y.o.   MRN: [id number]  HPI: Daisy Watkins is a 83 y.o. female  Chief Complaint  Patient presents with  . Atrial Fibrillation    PT/ INR- pt taking 7 mg of warfarin daily    Coumadin Currently taking 7 mg but took 6 mg last night Last INR was at goal No bruising or bleeding No changes in diet No new medications  Constipation Bothers her and relieved by Ex lax and MOM.  Miralax makes it worse  Right elbow pain Pt with complaints of lateral elbow pain that radiates to shoulder.  Stated about a month ago when cyst taken off her right upper back   Social History   Social History  . Marital status: Divorced    Spouse name: N/A  . Number of children: N/A  . Years of education: N/A   Occupational History  . Not on file.   Social History Main Topics  . Smoking status: Former Smoker    Packs/day: 0.50    Years: 30.00    Types: Cigarettes    Quit date: 03/31/1977  . Smokeless tobacco: Never Used  . Alcohol use No     Comment: rare  . Drug use: No  . Sexual activity: No   Other Topics Concern  . Not on file   Social History Narrative  . No narrative on file   Family History  Problem Relation Age of Onset  . Diabetes Maternal Grandmother   . Hypertension Mother   . Cancer Daughter        breast  . Heart disease Son   . Heart disease Son      Relevant past medical, surgical, family and social history reviewed and updated as indicated. Interim medical history since our last visit reviewed. Allergies and medications reviewed and updated.  Review of Systems  Per HPI unless specifically indicated above     Objective:    BP 125/76 (BP Location: Left Arm, Cuff Size: Normal)   Pulse 60   Temp 98.2 F (36.8 C)    Wt 202 lb 9.6 oz (91.9 kg)   LMP  (LMP Unknown)   SpO2 96%   BMI 34.99 kg/m   Wt Readings from Last 3 Encounters:  12/16/16 202 lb 9.6 oz (91.9 kg)  11/12/16 204 lb 3.2 oz (92.6 kg)  11/05/16 209 lb (94.8 kg)    Physical Exam  Constitutional: She is oriented to person, place, and time. She appears well-developed and well-nourished. No distress.  HENT:  Head: Normocephalic and atraumatic.  Eyes: Conjunctivae and lids are normal. Right eye exhibits no discharge. Left eye exhibits no discharge. No scleral icterus.  Cardiovascular: Normal rate.  An irregularly irregular rhythm present.  Pulmonary/Chest: Effort normal.  Abdominal: Normal appearance. There is no splenomegaly or hepatomegaly.  Musculoskeletal: Normal range of motion.       Right elbow: She exhibits normal range of motion, no swelling, no effusion, no deformity and no laceration. Tenderness found.  Tender biceps insertion  Neurological: She is alert and oriented to person, place, and time.  Skin: Skin is intact. No rash noted. No pallor.  Psychiatric:  She has a normal mood and affect. Her behavior is normal. Judgment and thought content normal.    Results for orders placed or performed in visit on 11/12/16  Microscopic Examination  Result Value Ref Range   WBC, UA 0-5 0 - 5 /hpf   RBC, UA 0-2 0 - 2 /hpf   Epithelial Cells (non renal) 0-10 0 - 10 /hpf   Renal Epithel, UA 0-10 (A) None seen /hpf   Bacteria, UA None seen None seen/Few  Urine Culture, Reflex  Result Value Ref Range   Urine Culture, Routine Final report (A)    Organism ID, Bacteria Comment (A)    Antimicrobial Susceptibility Comment   Comprehensive metabolic panel  Result Value Ref Range   Glucose 154 (H) 65 - 99 mg/dL   BUN 9 8 - 27 mg/dL   Creatinine, Ser 0.68 0.57 - 1.00 mg/dL   GFR calc non Af Amer 82 >59 mL/min/1.73   GFR calc Af Amer 94 >59 mL/min/1.73   BUN/Creatinine Ratio 13 12 - 28   Sodium 140 134 - 144 mmol/L   Potassium 4.3 3.5 - 5.2  mmol/L   Chloride 101 96 - 106 mmol/L   CO2 25 20 - 29 mmol/L   Calcium 9.6 8.7 - 10.3 mg/dL   Total Protein 6.5 6.0 - 8.5 g/dL   Albumin 4.3 3.5 - 4.7 g/dL   Globulin, Total 2.2 1.5 - 4.5 g/dL   Albumin/Globulin Ratio 2.0 1.2 - 2.2   Bilirubin Total 0.6 0.0 - 1.2 mg/dL   Alkaline Phosphatase 110 39 - 117 IU/L   AST 19 0 - 40 IU/L   ALT 20 0 - 32 IU/L  Bayer DCA Hb A1c Waived  Result Value Ref Range   Bayer DCA Hb A1c Waived 8.3 (H) <7.0 %  Lipid Panel w/o Chol/HDL Ratio  Result Value Ref Range   Cholesterol, Total 265 (H) 100 - 199 mg/dL   Triglycerides 215 (H) 0 - 149 mg/dL   HDL 54 >39 mg/dL   VLDL Cholesterol Cal 43 (H) 5 - 40 mg/dL   LDL Calculated 168 (H) 0 - 99 mg/dL  CoaguChek XS/INR Waived  Result Value Ref Range   INR 2.2 (H) 0.9 - 1.1   Prothrombin Time 26.3 sec  UA/M w/rflx Culture, Routine  Result Value Ref Range   Specific Gravity, UA 1.010 1.005 - 1.030   pH, UA 6.0 5.0 - 7.5   Color, UA Orange Yellow   Appearance Ur Cloudy (A) Clear   Leukocytes, UA 1+ (A) Negative   Protein, UA Negative Negative/Trace   Glucose, UA Trace (A) Negative   Ketones, UA Negative Negative   RBC, UA Negative Negative   Bilirubin, UA Negative Negative   Urobilinogen, Ur 4.0 (H) 0.2 - 1.0 mg/dL   Nitrite, UA Negative Negative   Microscopic Examination See below:    Urinalysis Reflex Comment       Assessment & Plan:   Problem List Items Addressed This Visit      Unprioritized   Encounter for monitoring coumadin therapy   Monitoring for anticoagulant use (Chronic)    Pt just took 6 mg last night.  Would like to cut back.  INR is 2.6. Will cut back to 6 mg      Paroxysmal atrial fibrillation (HCC) - Primary   Relevant Orders   CoaguChek XS/INR Waived    Other Visit Diagnoses    Biceps tendinitis of right upper extremity       Information  and exercises given.       Follow up plan: Return in about 4 weeks (around 01/13/2017).

## 2016-12-16 NOTE — Assessment & Plan Note (Signed)
Pt just took 6 mg last night.  Would like to cut back.  INR is 2.6. Will cut back to 6 mg

## 2016-12-19 ENCOUNTER — Ambulatory Visit: Payer: Self-pay | Admitting: Pharmacist

## 2016-12-19 ENCOUNTER — Other Ambulatory Visit: Payer: Self-pay | Admitting: Pharmacist

## 2016-12-19 NOTE — Patient Outreach (Addendum)
[facility]) Care Management  12/19/2016  LASHAWNDRA LAMPKINS [birth year] [id number]  Unsuccessful telephone encounter with Ms. Kolar today.  I left a HIPAA compliant voicemail on her home phone.    Plan: I will follow-up with Ms. Levee next week regarding medication assistance.   Ralene Bathe, PharmD, [place] [phone]    ADDENDUM: I received an incoming phone-call from Ms. Zaeda. HIPAA identifiers verified.  Ms. Llamas states she received a letter from social security and has been approved for Extra Help / LIS.  She was able to pick up her insulin and reports the co-pay was $8.35.  She reports she is able to afford this co-pay now and does not have any further medication issues or concerns.   Plan: [facility] will close patient case at this time.  I am happy to assist in the future as needed.   Ralene Bathe, PharmD, [place] [phone]

## 2016-12-22 ENCOUNTER — Ambulatory Visit: Payer: Self-pay | Admitting: *Deleted

## 2016-12-25 ENCOUNTER — Other Ambulatory Visit: Payer: Self-pay | Admitting: Family Medicine

## 2016-12-29 ENCOUNTER — Other Ambulatory Visit: Payer: Self-pay | Admitting: *Deleted

## 2016-12-29 NOTE — Patient Outreach (Signed)
[facility]) Care Management  12/29/2016  CHARDONNAY HOLZMANN [birth year] [id number]   Phone call from patient stating that she has not heard from the [facility] regarding her mattress request.    Plan; This Education officer, museum will research the request and will return her call.    [name] [facility] Care Management [phone]

## 2016-12-30 ENCOUNTER — Other Ambulatory Visit: Payer: Self-pay | Admitting: Family Medicine

## 2016-12-30 ENCOUNTER — Other Ambulatory Visit: Payer: Self-pay | Admitting: *Deleted

## 2016-12-30 NOTE — Patient Outreach (Signed)
[facility]) Care Management   12/30/2016  Daisy Watkins 1932-05-02 [id number]  Daisy Watkins is an 83 y.o. female  Subjective:  Pt reports continues to check sugars, did not check this am/out of strips/last  Night was 147.  Pt reports to get new strips today.  Pt reports been doing good with diabetic  Diet, lost 9 lbs in a month, walking indoors for exercise.  Pt reports did not take her Coumadin Last night, ran out of the medication, son to get it today, feels better when not taking the  Medication/knows she needs it.   Pt reports to follow up for lab work 10/20, PCP 10/22- get flu  Shot then.    Objective:  Vitals:   12/30/16 1423  BP: 130/70  Pulse: 72  Resp: 20  SpO2: 98%    ROS  Physical Exam  Constitutional: She is oriented to person, place, and time. She appears well-developed and well-nourished.  Cardiovascular: Normal rate.   Irregular HR- hx of Atrial fib.   Respiratory: Effort normal and breath sounds normal.  GI: Soft. Bowel sounds are normal.  Musculoskeletal: Normal range of motion. She exhibits no edema.  Neurological: She is alert and oriented to person, place, and time.  Skin: Skin is warm and dry.  Psychiatric: She has a normal mood and affect. Her behavior is normal. Judgment and thought content normal.    Encounter Medications:   Outpatient Encounter Prescriptions as of 12/30/2016  Medication Sig Note  . amLODipine (NORVASC) 10 MG tablet Take 1 tablet (10 mg total) by mouth daily.   Marland Kitchen atorvastatin (LIPITOR) 20 MG tablet Take 1 tablet (20 mg total) by mouth daily.   . Cholecalciferol (VITAMIN D3) 1000 units CAPS Take by mouth. Daily 12/30/2016: Per pt takes one to twice a week.   . Cyanocobalamin (VITAMIN B-12) 5000 MCG SUBL Place under the tongue. Daily   . erythromycin [facility]) ophthalmic ointment Place 1 application into the left eye at bedtime.   . fluticasone (FLONASE) 50 MCG/ACT nasal spray Place into both nostrils daily.   Marland Kitchen  glucose blood test strip 1 each by Other route as needed for other. Check sugars TID, diagnosis is E11.65. LON-99. 10/31/2016: Checks once daily  . hydrochlorothiazide (HYDRODIURIL) 25 MG tablet Take 1 tablet (25 mg total) by mouth daily.   . insulin NPH-regular Human (NOVOLIN 70/30) (70-30) 100 UNIT/ML injection INCREASE AS DIRECTED TO 45 UNITS SUBCUTANEOUSLY EVERY MORNING WITH BREAKFAST AND 30 UNITS BEFORE EVENING MEAL OR AS DIRECTED   . Insulin Syringe-Needle U-100 (B-D INS SYRINGE 0.5CC/31GX5/16) 31G X 5/16" 0.5 ML MISC Inject 1 Units as directed 2 (two) times daily.   Marland Kitchen LORazepam (ATIVAN) 1 MG tablet TAKE 1 TABLET AT BEDTIME AS NEEDED FOR ANXIETY.   . magnesium oxide (MAG-OX) 400 MG tablet Take by mouth daily.    . montelukast (SINGULAIR) 10 MG tablet Take 1 tablet (10 mg total) by mouth at bedtime.   Marland Kitchen omeprazole (PRILOSEC) 40 MG capsule TAKE ONE (1) CAPSULE EACH DAY   . ramipril (ALTACE) 10 MG capsule Take 1 capsule (10 mg total) by mouth daily.   . vitamin C (ASCORBIC ACID) 500 MG tablet Take 500 mg by mouth daily.   Marland Kitchen warfarin (COUMADIN) 6 MG tablet Take 1 tablet (6 mg total) by mouth daily.   Marland Kitchen warfarin (COUMADIN) 1 MG tablet TAKE ONE TABLET BY MOUTH AT 6 P.M. (Patient not taking: Reported on 12/30/2016)    No facility-administered encounter medications on  file as of 12/30/2016.     Functional Status:   In your present state of health, do you have any difficulty performing the following activities: 11/05/2016 09/05/2016  Hearing? Y N  Comment per pt a little  -  Vision? Y N  Comment to have cataract removed this month- left eye  -  Difficulty concentrating or making decisions? N N  Walking or climbing stairs? N N  Dressing or bathing? N N  Doing errands, shopping? N N  Preparing Food and eating ? Y N  Using the Toilet? N N  In the past six months, have you accidently leaked urine? N N  Do you have problems with loss of bowel control? N N  Managing your Medications? N N  Managing your  Finances? N N  Housekeeping or managing your Housekeeping? N N  Some recent data might be hidden    Fall/Depression Screening:    Fall Risk  11/05/2016 10/24/2016 09/05/2016  Falls in the past year? Yes No Yes  Number falls in past yr: 1 - 1  Injury with Fall? No - No  Follow up - - -   PHQ 2/9 Scores 11/05/2016 10/24/2016 09/05/2016 09/05/2016 04/02/2016 09/19/2015 09/21/2014  PHQ - 2 Score 0 0 0 0 '1 6 2  '$ PHQ- 9 Score - - - 3 - 17 -    Assessment:  Pleasant 83 year old woman, residing with son in his home.      Lungs clear, no sob, edema or pain.  DM:   pt's last A1C obtained 11/12/16 (view in Epic) was 8.3 up from 7.6    View of pt's sugar    Via glucometer- ranges am 67- 219, pm 148-350.   Averages 14 day- 185,30 day- 187.  Medication issue:  Pt did not take Coumadin last night, out of medication.  Per pt, son to pick   Up today- reinforced adherence.   Plan:  As discussed with pt, plan to follow up again next month- home visit.    THN CM Care Plan Problem One     Most Recent Value  Care Plan Problem One  Diabetes- elevated sugars/self management   Role Documenting the Problem One  Care Management Coordinator  Care Plan for Problem One  Active  THN Long Term Goal   Pt would see a one point decrease in her A1Cin the next 65 days   THN Long Term Goal Start Date  11/05/16  THN CM Short Term Goal #1   Pt would have better adherence with diabetic diet in the next 30 days   THN CM Short Term Goal #1 Start Date  11/05/16  [facility] CM Short Term Goal #1 Met Date  12/30/16  THN CM Short Term Goal #2   Pt would check sugars daily for next 30 days,bring glucometer results to upcoming PCP visit   THN CM Short Term Goal #2 Start Date  11/05/16    [facility] CM Care Plan Problem Two     Most Recent Value  Care Plan Problem Two  Diabetes- overweight    Role Documenting the Problem Two  Care Management Coordinator  Care Plan for Problem Two  Active  Interventions for Problem Two Long Term Goal   Discussed with pt  benefit of weight loss in managing diabetes   THN Long Term Goal  Pt would loose 5 lbs within the next 35 days   THN Long Term Goal Start Date  12/30/16  [facility] CM Short Term Goal #  1   Pt would do small frequent meals/include protein for the next 30 days   THN CM Short Term Goal #1 Start Date  12/30/16  Interventions for Short Term Goal #2   Discussed with pt  benefiit of having small frequent meals that include protein   THN CM Short Term Goal #2   Pt would do daily short intervals of exercise 5 days a week for the next 30 days   THN CM Short Term Goal #2 Start Date  12/30/16  Interventions for Short Term Goal #2  Commended pt on loosing 9 lbs last month, encouraging to increase daily exercises (short intervals.)      Zara Chess.   [place] Care Management  [phone]

## 2016-12-30 NOTE — Telephone Encounter (Signed)
Your patient 

## 2016-12-31 ENCOUNTER — Other Ambulatory Visit: Payer: Self-pay | Admitting: *Deleted

## 2016-12-31 NOTE — Patient Outreach (Signed)
[facility]) Care Management  12/31/2016  Daisy Watkins 1932-08-07 [id number]   Phone call to the [facility] to follow up on referral for a new mattress for patient. Voicemail message left for a return call.   [name] [facility] Care Management [phone]

## 2017-01-02 ENCOUNTER — Other Ambulatory Visit: Payer: Self-pay | Admitting: Family Medicine

## 2017-01-02 DIAGNOSIS — I10 Essential (primary) hypertension: Secondary | ICD-10-CM

## 2017-01-06 ENCOUNTER — Other Ambulatory Visit: Payer: Self-pay | Admitting: *Deleted

## 2017-01-06 NOTE — Patient Outreach (Signed)
[name]) Care Management  01/06/2017  Daisy Watkins 07/04/1932 [id number]   Phone call to patient to inform her that the [facility] was contacted and they do not have the request for the mattress that she needs. The paperwork will have to be completed again.  Home visit scheduled for 01/08/17 at 11:00am to complete the referral forms.     [name] [facility] Care Management [phone]

## 2017-01-06 NOTE — Patient Outreach (Signed)
[facility] [facility]) Care Management  01/06/2017  Daisy Watkins 1933-03-05 [id number]   Return phone call from the [place] stating that they did not have the request forms for the mattress for patient. They will need to be re-faxed. This Education officer, museum does not have a copy of the forms and will need to contact patient to get them completed again.    [name] [facility] Care Management [phone]

## 2017-01-08 ENCOUNTER — Ambulatory Visit: Payer: Self-pay | Admitting: *Deleted

## 2017-01-13 ENCOUNTER — Other Ambulatory Visit: Payer: Self-pay | Admitting: *Deleted

## 2017-01-13 NOTE — Patient Outreach (Signed)
[name]) Care Management  [facility] Social Work  01/13/2017  Daisy Watkins 11-11-1932 [id number]  Subjective:  Patient is a 83 year old female.  Patient referred to this social worker initial to assist with housing resources. Housing resources previoiusly provied, patient has plans to move, however not a this time. . Patient however does need assistance with getting a new mattress. Paper work completed to request the mattress through the [place] and will be faxed to them for review.  Objective:   Encounter Medications:  Outpatient Encounter Prescriptions as of 01/13/2017  Medication Sig Note  . amLODipine (NORVASC) 10 MG tablet Take 1 tablet (10 mg total) by mouth daily.   Marland Kitchen atorvastatin (LIPITOR) 20 MG tablet Take 1 tablet (20 mg total) by mouth daily.   . Cholecalciferol (VITAMIN D3) 1000 units CAPS Take by mouth. Daily 12/30/2016: Per pt takes one to twice a week.   . Cyanocobalamin (VITAMIN B-12) 5000 MCG SUBL Place under the tongue. Daily   . erythromycin [facility]) ophthalmic ointment Place 1 application into the left eye at bedtime.   . fluticasone (FLONASE) 50 MCG/ACT nasal spray Place into both nostrils daily.   Marland Kitchen glucose blood test strip 1 each by Other route as needed for other. Check sugars TID, diagnosis is E11.65. LON-99. 10/31/2016: Checks once daily  . hydrochlorothiazide (HYDRODIURIL) 25 MG tablet Take 1 tablet (25 mg total) by mouth daily.   . insulin NPH-regular Human (NOVOLIN 70/30) (70-30) 100 UNIT/ML injection INCREASE AS DIRECTED TO 45 UNITS SUBCUTANEOUSLY EVERY MORNING WITH BREAKFAST AND 30 UNITS BEFORE EVENING MEAL OR AS DIRECTED   . Insulin Syringe-Needle U-100 (B-D INS SYRINGE 0.5CC/31GX5/16) 31G X 5/16" 0.5 ML MISC Inject 1 Units as directed 2 (two) times daily.   Marland Kitchen LORazepam (ATIVAN) 1 MG tablet TAKE 1 TABLET AT BEDTIME AS NEEDED FOR ANXIETY.   . magnesium oxide (MAG-OX) 400 MG tablet Take by mouth daily.    .  montelukast (SINGULAIR) 10 MG tablet Take 1 tablet (10 mg total) by mouth at bedtime.   Marland Kitchen omeprazole (PRILOSEC) 40 MG capsule TAKE ONE (1) CAPSULE EACH DAY   . ramipril (ALTACE) 10 MG capsule Take 1 capsule (10 mg total) by mouth daily.   . vitamin C (ASCORBIC ACID) 500 MG tablet Take 500 mg by mouth daily.   Marland Kitchen warfarin (COUMADIN) 1 MG tablet TAKE ONE TABLET BY MOUTH AT 6 P.M.   . warfarin (COUMADIN) 6 MG tablet Take 1 tablet (6 mg total) by mouth daily.    No facility-administered encounter medications on file as of 01/13/2017.     Functional Status:  In your present state of health, do you have any difficulty performing the following activities: 11/05/2016 09/05/2016  Hearing? Y N  Comment per pt a little  -  Vision? Y N  Comment to have cataract removed this month- left eye  -  Difficulty concentrating or making decisions? N N  Walking or climbing stairs? N N  Dressing or bathing? N N  Doing errands, shopping? N N  Preparing Food and eating ? Y N  Using the Toilet? N N  In the past six months, have you accidently leaked urine? N N  Do you have problems with loss of bowel control? N N  Managing your Medications? N N  Managing your Finances? N N  Housekeeping or managing your Housekeeping? N N  Some recent data might be hidden    Fall/Depression Screening:  PHQ 2/9 Scores  11/05/2016 10/24/2016 09/05/2016 09/05/2016 04/02/2016 09/19/2015 09/21/2014  PHQ - 2 Score 0 0 0 0 1 6 2   PHQ- 9 Score - - - 3 - 17 -    Assessment: Pleasant visit with patient today who continues to reside with her son and daughter in law. Per patient, she e plans to move soon but for now needs a new mattress. Paperwork completed, the furniture ministry will follow up with patient once referral is processed.  Plan: This Education officer, museum will follow up with patient within 1 month regarding furniture needs.    [name] [facility] Care Management [phone]

## 2017-01-14 ENCOUNTER — Other Ambulatory Visit: Payer: Self-pay | Admitting: *Deleted

## 2017-01-15 ENCOUNTER — Other Ambulatory Visit: Payer: Self-pay | Admitting: Unknown Physician Specialty

## 2017-01-16 ENCOUNTER — Other Ambulatory Visit: Payer: Self-pay | Admitting: Unknown Physician Specialty

## 2017-01-19 ENCOUNTER — Ambulatory Visit: Payer: Medicare HMO | Admitting: Unknown Physician Specialty

## 2017-01-23 ENCOUNTER — Encounter: Payer: Self-pay | Admitting: Family Medicine

## 2017-01-23 ENCOUNTER — Ambulatory Visit (INDEPENDENT_AMBULATORY_CARE_PROVIDER_SITE_OTHER): Payer: Medicare HMO | Admitting: Family Medicine

## 2017-01-23 VITALS — BP 137/84 | HR 64 | Wt 203.0 lb

## 2017-01-23 DIAGNOSIS — I10 Essential (primary) hypertension: Secondary | ICD-10-CM

## 2017-01-23 DIAGNOSIS — I480 Paroxysmal atrial fibrillation: Secondary | ICD-10-CM | POA: Diagnosis not present

## 2017-01-23 DIAGNOSIS — Z23 Encounter for immunization: Secondary | ICD-10-CM | POA: Diagnosis not present

## 2017-01-23 LAB — COAGUCHEK XS/INR WAIVED
INR: 2.1 — AB (ref 0.9–1.1)
PROTHROMBIN TIME: 25.2 s

## 2017-01-23 MED ORDER — RAMIPRIL 10 MG PO CAPS: 10.0000 mg | ORAL_CAPSULE | Freq: Every day | ORAL | 10 refills | Status: DC

## 2017-01-23 MED ORDER — WARFARIN SODIUM 1 MG PO TABS: ORAL_TABLET | ORAL | 3 refills | Status: DC

## 2017-01-23 MED ORDER — HYDROCHLOROTHIAZIDE 25 MG PO TABS: 25.0000 mg | ORAL_TABLET | Freq: Every day | ORAL | 10 refills | Status: DC

## 2017-01-23 MED ORDER — AMLODIPINE BESYLATE 10 MG PO TABS: 10.0000 mg | ORAL_TABLET | Freq: Every day | ORAL | 10 refills | Status: DC

## 2017-01-23 MED ORDER — WARFARIN SODIUM 6 MG PO TABS: 6.0000 mg | ORAL_TABLET | Freq: Every day | ORAL | 3 refills | Status: DC

## 2017-01-23 NOTE — Progress Notes (Signed)
   BP 137/84   Pulse 64   Wt 203 lb (92.1 kg)   LMP  (LMP Unknown)   SpO2 98%   BMI 35.96 kg/m    Subjective:    Patient ID: Daisy Watkins, female    DOB: [birth year], 83 y.o.   MRN: [id number]  HPI: Daisy Watkins is a 83 y.o. female  Follow-up INR. Patient taking warfarin without problems no bleeding bruising issues. Blood pressure also showing good control. His been trying to lose weight and doing better with diabetes to get better control of her glucose. Is doing hemoglobin A1c next month.  Relevant past medical, surgical, family and social history reviewed and updated as indicated. Interim medical history since our last visit reviewed. Allergies and medications reviewed and updated.  Review of Systems  Constitutional: Negative.   Respiratory: Negative.   Cardiovascular: Negative.     Per HPI unless specifically indicated above     Objective:    BP 137/84   Pulse 64   Wt 203 lb (92.1 kg)   LMP  (LMP Unknown)   SpO2 98%   BMI 35.96 kg/m   Wt Readings from Last 3 Encounters:  01/23/17 203 lb (92.1 kg)  12/30/16 206 lb (93.4 kg)  12/16/16 202 lb 9.6 oz (91.9 kg)    Physical Exam  Constitutional: She is oriented to person, place, and time. She appears well-developed and well-nourished.  HENT:  Head: Normocephalic and atraumatic.  Eyes: Conjunctivae and EOM are normal.  Neck: Normal range of motion.  Cardiovascular: Normal rate, regular rhythm and normal heart sounds.   Pulmonary/Chest: Effort normal and breath sounds normal.  Musculoskeletal: Normal range of motion.  Neurological: She is alert and oriented to person, place, and time.  Skin: No erythema.  Psychiatric: She has a normal mood and affect. Her behavior is normal. Judgment and thought content normal.    Results for orders placed or performed in visit on 12/16/16  CoaguChek XS/INR Waived  Result Value Ref Range   INR 2.6 (H) 0.9 - 1.1   Prothrombin Time 31.2 sec      Assessment & Plan:   Problem  List Items Addressed This Visit      Cardiovascular and Mediastinum   Benign hypertension (Chronic)   Relevant Medications   amLODipine (NORVASC) 10 MG tablet   hydrochlorothiazide (HYDRODIURIL) 25 MG tablet   ramipril (ALTACE) 10 MG capsule   warfarin (COUMADIN) 6 MG tablet   warfarin (COUMADIN) 1 MG tablet   Paroxysmal atrial fibrillation (HCC)   Relevant Medications   amLODipine (NORVASC) 10 MG tablet   hydrochlorothiazide (HYDRODIURIL) 25 MG tablet   ramipril (ALTACE) 10 MG capsule   warfarin (COUMADIN) 6 MG tablet   warfarin (COUMADIN) 1 MG tablet   Other Relevant Orders   CoaguChek XS/INR Waived    Other Visit Diagnoses    Needs flu shot    -  Primary   Relevant Orders   Flu vaccine HIGH DOSE PF (Fluzone High dose) (Completed)       Follow up plan: Return in about 4 weeks (around 02/20/2017) for Hemoglobin A1c, PT INR.

## 2017-01-27 ENCOUNTER — Other Ambulatory Visit: Payer: Self-pay | Admitting: *Deleted

## 2017-01-27 NOTE — Patient Outreach (Signed)
[facility]) Care Management  01/27/2017  Daisy Watkins 08-Mar-1933 [id number]   Phone call from the [facility], spoke with [name] confirming that she hs received patient's referral and will be happy to assist with a mattress for patient, however is requesting that she make arrangements to pick up the items up due to their delivery schedule being well into December. Per [name], she will call patient to see if a pick up date and time can be arranged.   [name] [facility] Care Management [phone]

## 2017-01-28 ENCOUNTER — Other Ambulatory Visit: Payer: Self-pay | Admitting: *Deleted

## 2017-01-28 NOTE — Patient Outreach (Signed)
[facility]) Care Management  01/28/2017  Daisy Watkins May 17, 1932 [id number]   Phone call to patient to follow up on referral from the [facility]. Per patient, she has received a call from them notifying her that they had a lamp and mattress available for her. Per patient, she will call them tomorrow to make arrangements to pick the items up.   Plan: This Education officer, museum will follow up with patient within 1 week regarding receipt of the items requested.    [name] [facility] Care Management [phone]

## 2017-01-29 ENCOUNTER — Other Ambulatory Visit: Payer: Self-pay | Admitting: *Deleted

## 2017-01-29 NOTE — Patient Outreach (Signed)
Unsuccessful telephone encounter to Daisy Watkins, 83 year old female as this RN CM received an in basket from [place] that pt called,wanted to cancel tomorrow's home visit,requested RN CM call her.  This RN CM provided pt with [facility] CM services- assisting pt with better self management of diabetes.   HIPAA compliant voice message left with contact name and number.    Plan:  If no response to voice message left, plan to follow up again next week telephonically.    Zara Chess.   [facility]  [phone]

## 2017-01-30 ENCOUNTER — Encounter: Payer: Self-pay | Admitting: *Deleted

## 2017-01-30 ENCOUNTER — Ambulatory Visit: Payer: Self-pay | Admitting: *Deleted

## 2017-01-30 NOTE — Telephone Encounter (Signed)
This encounter was created in error - please disregard.

## 2017-02-03 ENCOUNTER — Other Ambulatory Visit: Payer: Self-pay | Admitting: *Deleted

## 2017-02-03 ENCOUNTER — Encounter: Payer: Self-pay | Admitting: *Deleted

## 2017-02-03 NOTE — Patient Outreach (Signed)
Unsuccessful telephone encounter to Daisy Watkins, 83 year old female- schedule home visit.  HIPAA compliant voice message left with contact name and number, request to return call.      Plan:  If no response, plan to follow up again tomorrow telephonically  Zara Chess.   [facility]  [phone]  Addendum:   Late entry for return phone call from pt 01/30/17 at 11:25 am-  Pt requested a call  next week to schedule home visit.

## 2017-02-10 ENCOUNTER — Other Ambulatory Visit: Payer: Self-pay | Admitting: *Deleted

## 2017-02-10 NOTE — Patient Outreach (Signed)
9:01 am - unsuccessful telephone encounter to Daisy Watkins, 83 year old female - schedule home visit.  HIPAA compliant voice message left with contact name and number.   9:04 am - received a return call from pt to voice message left by RN CM.  Spoke with pt, HIPAA identifiers provided, discussed purpose of call- schedule home visit.   Pt reports did not loose 5 lbs(goal) but did loose 3 lbs, continues to have small frequent meals.    Plan: As discussed with pt, plan to follow up again later this week- home visit.   Zara Chess.   [facility]  [phone]

## 2017-02-13 ENCOUNTER — Other Ambulatory Visit: Payer: Self-pay | Admitting: *Deleted

## 2017-02-13 NOTE — Patient Outreach (Signed)
[facility]) Care Management   02/13/2017  Daisy Watkins 09-17-1932 [id number]  Daisy Watkins is an 83 y.o. female  Subjective: Pt reports been loosing weight, total of 3 lbs, yesterday was 185.5 lbs, weighs 1-2 times a week, feeling good.  Pt reports having three small meals and night time snack.  Pt reports sugars are good, one morning was 84.  Pt reports planning to stay at son's house Until January, got a call about furniture to be delivered, will store at son's home until Daisy Watkins  Moves.    Objective:   Vitals:   02/13/17 1338  BP: 130/60  Pulse: 64  Resp: 20  SpO2: 96%    ROS  Physical Exam  Constitutional: Daisy Watkins is oriented to person, place, and time. Daisy Watkins appears well-developed and well-nourished.  Cardiovascular: Normal rate.  Slightly irregular   Respiratory: Effort normal and breath sounds normal.  GI: Soft.  Musculoskeletal: Normal range of motion. Daisy Watkins exhibits no edema.  Neurological: Daisy Watkins is alert and oriented to person, place, and time.  Skin: Skin is warm and dry.  Psychiatric: Daisy Watkins has a normal mood and affect. Her behavior is normal. Judgment and thought content normal.    Encounter Medications:   Outpatient Encounter Medications as of 02/13/2017  Medication Sig Note  . amLODipine (NORVASC) 10 MG tablet Take 1 tablet (10 mg total) by mouth daily.   Marland Kitchen atorvastatin (LIPITOR) 20 MG tablet Take 1 tablet (20 mg total) by mouth daily.   . Cholecalciferol (VITAMIN D3) 1000 units CAPS Take by mouth. Daily 12/30/2016: Per pt takes one to twice a week.   . Cyanocobalamin (VITAMIN B-12) 5000 MCG SUBL Place under the tongue. Daily   . fluticasone (FLONASE) 50 MCG/ACT nasal spray Place into both nostrils daily.   . hydrochlorothiazide (HYDRODIURIL) 25 MG tablet Take 1 tablet (25 mg total) by mouth daily.   . insulin NPH-regular Human (NOVOLIN 70/30) (70-30) 100 UNIT/ML injection INCREASE AS DIRECTED TO 45 UNITS SUBCUTANEOUSLY EVERY MORNING WITH BREAKFAST AND 30  UNITS BEFORE EVENING MEAL OR AS DIRECTED   . Insulin Syringe-Needle U-100 (B-D INS SYRINGE 0.5CC/31GX5/16) 31G X 5/16" 0.5 ML MISC Inject 1 Units as directed 2 (two) times daily.   Marland Kitchen LORazepam (ATIVAN) 1 MG tablet TAKE 1 TABLET AT BEDTIME AS NEEDED FOR ANXIETY   . magnesium oxide (MAG-OX) 400 MG tablet Take by mouth daily.    . montelukast (SINGULAIR) 10 MG tablet Take 1 tablet (10 mg total) by mouth at bedtime.   Marland Kitchen omeprazole (PRILOSEC) 40 MG capsule TAKE ONE (1) CAPSULE EACH DAY   . ramipril (ALTACE) 10 MG capsule Take 1 capsule (10 mg total) by mouth daily.   . vitamin C (ASCORBIC ACID) 500 MG tablet Take 500 mg by mouth daily.   Marland Kitchen warfarin (COUMADIN) 1 MG tablet TAKE ONE TABLET BY MOUTH AT 6 P.M.   . warfarin (COUMADIN) 6 MG tablet Take 1 tablet (6 mg total) by mouth daily.    No facility-administered encounter medications on file as of 02/13/2017.     Functional Status:   In your present state of health, do you have any difficulty performing the following activities: 11/05/2016 09/05/2016  Hearing? Y N  Comment per pt a little  -  Vision? Y N  Comment to have cataract removed this month- left eye  -  Difficulty concentrating or making decisions? N N  Walking or climbing stairs? N N  Dressing or bathing? N N  Doing errands, shopping?  N N  Preparing Food and eating ? Y N  Using the Toilet? N N  In the past six months, have you accidently leaked urine? N N  Do you have problems with loss of bowel control? N N  Managing your Medications? N N  Managing your Finances? N N  Housekeeping or managing your Housekeeping? N N  Some recent data might be hidden    Fall/Depression Screening:    Fall Risk  01/23/2017 11/05/2016 10/24/2016  Falls in the past year? No Yes No  Number falls in past yr: - 1 -  Injury with Fall? - No -  Follow up - - -   PHQ 2/9 Scores 01/23/2017 11/05/2016 10/24/2016 09/05/2016 09/05/2016 04/02/2016 09/19/2015  PHQ - 2 Score 0 0 0 0 0 1 6  PHQ- 9 Score - - - - 3 - 17     Assessment:  Pleasant 83 year old female, resides with son/wife.  History includes but not limited To DM, benign Hypertension,DVT, CAD, acid reflux, Hyperlipidemia.   DM:  Review of pt's glucometer readings- sugars range 93-231, mostly in 100's, Averages 14 day-           163, 30 day - 150.    Weight loss:  Per pt reports a 3 lb. Weight loss, exercising taking walks.  Per pt having 3 small            Meals a day with a night time snack, protein included most times.   Plan:  As discussed with pt, plan to discharge from [facility] CM services, goals met.             Plan to inform Chrystal [facility] LCSW of case closure.            Plan to inform  Kathrine Haddock NP (PCP) of discharge, send update letter/route 11/16                Home visit.    [facility] CM Care Plan Problem Two     Most Recent Value  [facility] CM Short Term Goal #1   Re established- pt would continue to do small frequent meals /include protein for the next 3 days   [facility] CM Short Term Goal #1 Start Date  02/10/17  [facility] CM Short Term Goal #1 Met Date   02/13/17

## 2017-02-13 NOTE — Patient Outreach (Signed)
[facility]) Care Management  02/13/2017  CHASTA DESHPANDE 1932/09/24 [id number]   Phone call to patient to confirm that she has received the mattress form the [employer]. Patient did not answer, voicemail message left requesting a return call.   [name] [facility] [phone]

## 2017-02-14 ENCOUNTER — Encounter: Payer: Self-pay | Admitting: *Deleted

## 2017-02-16 ENCOUNTER — Other Ambulatory Visit: Payer: Self-pay | Admitting: *Deleted

## 2017-02-16 NOTE — Patient Outreach (Signed)
[facility]) Care Management  02/16/2017  Daisy Watkins 19-Jul-1932 [id number]   Late Entry-return call from patient confirming that she has made contact with the [employer] and they will delivery her furniture (mattress and lamp) next week. Patient very appreciative of referral and verbalized no additional social work Data processing manager needs   Plan: patient to be closed to [place] management at this timee.    [name] [facility] [phone]

## 2017-02-20 ENCOUNTER — Other Ambulatory Visit: Payer: Self-pay | Admitting: Unknown Physician Specialty

## 2017-02-23 ENCOUNTER — Encounter: Payer: Self-pay | Admitting: Unknown Physician Specialty

## 2017-02-23 ENCOUNTER — Ambulatory Visit: Payer: Medicare HMO | Admitting: Unknown Physician Specialty

## 2017-02-23 VITALS — BP 158/70 | HR 58 | Temp 98.2°F | Wt 205.4 lb

## 2017-02-23 DIAGNOSIS — E7800 Pure hypercholesterolemia, unspecified: Secondary | ICD-10-CM

## 2017-02-23 DIAGNOSIS — F5101 Primary insomnia: Secondary | ICD-10-CM | POA: Diagnosis not present

## 2017-02-23 DIAGNOSIS — I482 Chronic atrial fibrillation, unspecified: Secondary | ICD-10-CM

## 2017-02-23 DIAGNOSIS — Z5181 Encounter for therapeutic drug level monitoring: Secondary | ICD-10-CM | POA: Diagnosis not present

## 2017-02-23 DIAGNOSIS — M791 Myalgia, unspecified site: Secondary | ICD-10-CM | POA: Diagnosis not present

## 2017-02-23 DIAGNOSIS — I10 Essential (primary) hypertension: Secondary | ICD-10-CM

## 2017-02-23 DIAGNOSIS — I480 Paroxysmal atrial fibrillation: Secondary | ICD-10-CM | POA: Diagnosis not present

## 2017-02-23 DIAGNOSIS — Z7901 Long term (current) use of anticoagulants: Secondary | ICD-10-CM

## 2017-02-23 DIAGNOSIS — E1165 Type 2 diabetes mellitus with hyperglycemia: Secondary | ICD-10-CM | POA: Diagnosis not present

## 2017-02-23 DIAGNOSIS — IMO0001 Reserved for inherently not codable concepts without codable children: Secondary | ICD-10-CM

## 2017-02-23 HISTORY — DX: Myalgia, unspecified site: M79.10

## 2017-02-23 LAB — COAGUCHEK XS/INR WAIVED
INR: 2.3 — ABNORMAL HIGH (ref 0.9–1.1)
Prothrombin Time: 27.1 s

## 2017-02-23 LAB — BAYER DCA HB A1C WAIVED: HB A1C (BAYER DCA - WAIVED): 8.6 % — ABNORMAL HIGH (ref <7.0)

## 2017-02-23 MED ORDER — GLIPIZIDE ER 5 MG PO TB24: 5.0000 mg | ORAL_TABLET | Freq: Every day | ORAL | 3 refills | Status: DC

## 2017-02-23 NOTE — Assessment & Plan Note (Signed)
Hgb A1C is 8.6%.  Unable to tolerated Metformin.  Pt refuses based on hypoglycemia to go up on insulin. Unable to afford any thing more expensive.  Add glipizide just once during the day.

## 2017-02-23 NOTE — Assessment & Plan Note (Signed)
Taking Lorazepam at night.  This seems to be the lowest tolerated dose at this time

## 2017-02-23 NOTE — Progress Notes (Signed)
BP (!) 158/70 (BP Location: Left Arm, Cuff Size: Normal)   Pulse (!) 58   Temp 98.2 F (36.8 C) (Oral)   Wt 205 lb 6.4 oz (93.2 kg)   LMP  (LMP Unknown)   SpO2 96%   BMI 35.81 kg/m    Subjective:    Patient ID: Daisy Watkins, female    DOB: December 28, 1932, 83 y.o.   MRN: [id number]  HPI: Daisy Watkins is a 83 y.o. female  Chief Complaint  Patient presents with  . Atrial Fibrillation  . Diabetes  . Depression  . Hypertension   Diabetes: Using medications without difficulties.  Taking 45U of 30/30 in the AM and 30 in the PM No hypoglycemic episodes States one morning it was down to 20 but managed it well No hyperglycemic episodes Feet problems: none Blood Sugars averaging: 118-134 eye exam within last year Last Hgb A1C: 8.3%  Hypertension  Using medications without difficulty Average home BPs Not checking   Using medication without problems or lightheadedness No chest pain with exertion or shortness of breath No Edema  Elevated Cholesterol Using medications without problems No Muscle aches  Diet: Exercise:  Essentially sendentary  Afib On coumadin and here for monitoring  Insomnia Pt takes it once QHS.  She has taken it for 8 years.  We changed from TID to QHS and switched from 2 to 1 mg.    Relevant past medical, surgical, family and social history reviewed and updated as indicated. Interim medical history since our last visit reviewed. Allergies and medications reviewed and updated.  Review of Systems  Constitutional: Negative.   HENT: Negative.   Respiratory: Negative.   Cardiovascular: Negative.   Musculoskeletal:       Generalized pain.  Tylenol not working  Neurological: Negative.     Per HPI unless specifically indicated above     Objective:    BP (!) 158/70 (BP Location: Left Arm, Cuff Size: Normal)   Pulse (!) 58   Temp 98.2 F (36.8 C) (Oral)   Wt 205 lb 6.4 oz (93.2 kg)   LMP  (LMP Unknown)   SpO2 96%   BMI 35.81 kg/m   Wt Readings  from Last 3 Encounters:  02/23/17 205 lb 6.4 oz (93.2 kg)  02/13/17 185 lb 8 oz (84.1 kg)  01/23/17 203 lb (92.1 kg)    Physical Exam  Constitutional: She is oriented to person, place, and time. She appears well-developed and well-nourished. No distress.  HENT:  Head: Normocephalic and atraumatic.  Eyes: Conjunctivae and lids are normal. Right eye exhibits no discharge. Left eye exhibits no discharge. No scleral icterus.  Neck: Normal range of motion. Neck supple. No JVD present. Carotid bruit is not present.  Cardiovascular: Normal heart sounds. An irregularly irregular rhythm present.  Pulmonary/Chest: Effort normal and breath sounds normal.  Abdominal: Normal appearance. There is no splenomegaly or hepatomegaly.  Musculoskeletal: Normal range of motion.  Neurological: She is alert and oriented to person, place, and time.  Skin: Skin is warm, dry and intact. No rash noted. No pallor.  Psychiatric: She has a normal mood and affect. Her behavior is normal. Judgment and thought content normal.    Results for orders placed or performed in visit on 01/23/17  CoaguChek XS/INR Waived  Result Value Ref Range   INR 2.1 (H) 0.9 - 1.1   Prothrombin Time 25.2 sec      Assessment & Plan:   Problem List Items Addressed This Visit  Unprioritized   Benign hypertension (Chronic)    Known white coat hypertension      Chronic a-fib (HCC) - Primary   Diabetes mellitus type 2, uncontrolled, without complications (HCC) (Chronic)    Hgb A1C is 8.6%.  Unable to tolerated Metformin.  Pt refuses based on hypoglycemia to go up on insulin. Unable to afford any thing more expensive.  Add glipizide just once during the day.        Relevant Medications   glipiZIDE (GLUCOTROL XL) 5 MG 24 hr tablet   Other Relevant Orders   Comprehensive metabolic panel   Bayer DCA Hb A1c Waived   Hyperlipidemia (Chronic)   Relevant Orders   Lipid Panel w/o Chol/HDL Ratio   Insomnia    Taking Lorazepam at  night.  This seems to be the lowest tolerated dose at this time      Monitoring for anticoagulant use (Chronic)    INR 2.3.  Continue present dose      Relevant Orders   CBC with Differential/Platelet   Myalgia    Will try OTC Aleve.  Renal functions are normal.  Consider prescription NSAID          Follow up plan: Return in about 4 weeks (around 03/23/2017). F/u on pain, glucose, and INR

## 2017-02-23 NOTE — Assessment & Plan Note (Signed)
Will try OTC Aleve.  Renal functions are normal.  Consider prescription NSAID

## 2017-02-23 NOTE — Assessment & Plan Note (Signed)
INR 2.3.  Continue present dose

## 2017-02-23 NOTE — Assessment & Plan Note (Signed)
Known white coat hypertension

## 2017-02-24 LAB — COMPREHENSIVE METABOLIC PANEL
ALT: 20 IU/L (ref 0–32)
AST: 17 IU/L (ref 0–40)
Albumin/Globulin Ratio: 1.7 (ref 1.2–2.2)
Albumin: 4.1 g/dL (ref 3.5–4.7)
Alkaline Phosphatase: 106 IU/L (ref 39–117)
BUN/Creatinine Ratio: 16 (ref 12–28)
BUN: 13 mg/dL (ref 8–27)
Bilirubin Total: 0.4 mg/dL (ref 0.0–1.2)
CALCIUM: 10.1 mg/dL (ref 8.7–10.3)
CO2: 24 mmol/L (ref 20–29)
CREATININE: 0.8 mg/dL (ref 0.57–1.00)
Chloride: 100 mmol/L (ref 96–106)
GFR calc Af Amer: 79 mL/min/{1.73_m2} (ref >59)
GFR, EST NON AFRICAN AMERICAN: 69 mL/min/{1.73_m2} (ref >59)
Globulin, Total: 2.4 g/dL (ref 1.5–4.5)
Glucose: 242 mg/dL — ABNORMAL HIGH (ref 65–99)
Potassium: 4.9 mmol/L (ref 3.5–5.2)
Sodium: 140 mmol/L (ref 134–144)
Total Protein: 6.5 g/dL (ref 6.0–8.5)

## 2017-02-24 LAB — CBC WITH DIFFERENTIAL/PLATELET
Basophils Absolute: 0 10*3/uL (ref 0.0–0.2)
Basos: 0 %
EOS (ABSOLUTE): 0 10*3/uL (ref 0.0–0.4)
EOS: 1 %
Hematocrit: 43.1 % (ref 34.0–46.6)
Hemoglobin: 13.7 g/dL (ref 11.1–15.9)
IMMATURE GRANS (ABS): 0 10*3/uL (ref 0.0–0.1)
IMMATURE GRANULOCYTES: 0 %
LYMPHS: 28 %
Lymphocytes Absolute: 1.6 10*3/uL (ref 0.7–3.1)
MCH: 28.7 pg (ref 26.6–33.0)
MCHC: 31.8 g/dL (ref 31.5–35.7)
MCV: 90 fL (ref 79–97)
MONOS ABS: 0.4 10*3/uL (ref 0.1–0.9)
Monocytes: 7 %
NEUTROS PCT: 64 %
Neutrophils Absolute: 3.7 10*3/uL (ref 1.4–7.0)
PLATELETS: 237 10*3/uL (ref 150–379)
RBC: 4.78 x10E6/uL (ref 3.77–5.28)
RDW: 14.6 % (ref 12.3–15.4)
WBC: 5.8 10*3/uL (ref 3.4–10.8)

## 2017-02-24 LAB — LIPID PANEL W/O CHOL/HDL RATIO
Cholesterol, Total: 271 mg/dL — ABNORMAL HIGH (ref 100–199)
HDL: 61 mg/dL (ref >39)
LDL CALC: 169 mg/dL — AB (ref 0–99)
TRIGLYCERIDES: 204 mg/dL — AB (ref 0–149)
VLDL CHOLESTEROL CAL: 41 mg/dL — AB (ref 5–40)

## 2017-03-30 ENCOUNTER — Ambulatory Visit: Payer: Medicare HMO | Admitting: Unknown Physician Specialty

## 2017-04-03 ENCOUNTER — Encounter: Payer: Self-pay | Admitting: Unknown Physician Specialty

## 2017-04-03 ENCOUNTER — Ambulatory Visit: Payer: Medicare HMO | Admitting: Unknown Physician Specialty

## 2017-04-03 VITALS — BP 174/70 | HR 91 | Temp 97.8°F | Wt 209.0 lb

## 2017-04-03 DIAGNOSIS — J0100 Acute maxillary sinusitis, unspecified: Secondary | ICD-10-CM

## 2017-04-03 DIAGNOSIS — I10 Essential (primary) hypertension: Secondary | ICD-10-CM

## 2017-04-03 DIAGNOSIS — I482 Chronic atrial fibrillation, unspecified: Secondary | ICD-10-CM

## 2017-04-03 DIAGNOSIS — Z7901 Long term (current) use of anticoagulants: Secondary | ICD-10-CM | POA: Diagnosis not present

## 2017-04-03 DIAGNOSIS — Z5181 Encounter for therapeutic drug level monitoring: Secondary | ICD-10-CM | POA: Diagnosis not present

## 2017-04-03 LAB — COAGUCHEK XS/INR WAIVED
INR: 1.9 — ABNORMAL HIGH (ref 0.9–1.1)
Prothrombin Time: 22.4 s

## 2017-04-03 MED ORDER — CEPHALEXIN 500 MG PO CAPS: 500.0000 mg | ORAL_CAPSULE | Freq: Three times a day (TID) | ORAL | 0 refills | Status: DC

## 2017-04-03 NOTE — Assessment & Plan Note (Signed)
INR 1.9.  Due to high BP I am reluctant to increase her Warfarin.  Will recheck in 1 month

## 2017-04-03 NOTE — Progress Notes (Signed)
BP (!) 174/70 (BP Location: Right Arm, Cuff Size: Normal)   Pulse 91   Temp 97.8 F (36.6 C) (Oral)   Wt 209 lb (94.8 kg)   LMP  (LMP Unknown)   SpO2 99%   BMI 36.44 kg/m    Subjective:    Patient ID: Daisy Watkins, female    DOB: 1933-02-20, 84 y.o.   MRN: [id number]  HPI: Daisy Watkins is a 84 y.o. female  Chief Complaint  Patient presents with  . Atrial Fibrillation    PT/ INR- pt states she is taking 1 mg of warfarin daily  . Hyperglycemia  . Pain   Coumadin Taking 6 mg/day.  No bruising or blood in urine or stool.    Hyperglycemia Increased Glipizide last visit.  Tolerating well but no improvement in blood sugars.  Refusing insulin and intolerant to Metformin.    Myofacial pain Not taking Aleve or anything else such as discussed last visit  Sinus For over a month.  Gives her headaches.  Coughing and sometimes productive  Hypertension Using medications without difficulty but stopped Amlodipine due to something she heard on TV Average home BPs 120's/70's at home   No problems or lightheadedness No chest pain with exertion or shortness of breath No Edema      Relevant past medical, surgical, family and social history reviewed and updated as indicated. Interim medical history since our last visit reviewed. Allergies and medications reviewed and updated.  Review of Systems  Per HPI unless specifically indicated above     Objective:    BP (!) 174/70 (BP Location: Right Arm, Cuff Size: Normal)   Pulse 91   Temp 97.8 F (36.6 C) (Oral)   Wt 209 lb (94.8 kg)   LMP  (LMP Unknown)   SpO2 99%   BMI 36.44 kg/m   Wt Readings from Last 3 Encounters:  04/03/17 209 lb (94.8 kg)  02/23/17 205 lb 6.4 oz (93.2 kg)  02/13/17 185 lb 8 oz (84.1 kg)    Physical Exam  Constitutional: She is oriented to person, place, and time. She appears well-developed and well-nourished. No distress.  HENT:  Head: Normocephalic and atraumatic.  Right Ear: Tympanic membrane and  ear canal normal.  Left Ear: Tympanic membrane and ear canal normal.  Nose: No rhinorrhea. Right sinus exhibits maxillary sinus tenderness. Right sinus exhibits no frontal sinus tenderness. Left sinus exhibits maxillary sinus tenderness. Left sinus exhibits no frontal sinus tenderness.  Eyes: Conjunctivae and lids are normal. Right eye exhibits no discharge. Left eye exhibits no discharge. No scleral icterus.  Cardiovascular: An irregularly irregular rhythm present.  Pulmonary/Chest: Effort normal and breath sounds normal. No respiratory distress.  Abdominal: Normal appearance. There is no splenomegaly or hepatomegaly.  Musculoskeletal: Normal range of motion.  Neurological: She is alert and oriented to person, place, and time.  Skin: Skin is intact. No rash noted. No pallor.  Psychiatric: She has a normal mood and affect. Her behavior is normal. Judgment and thought content normal.    Results for orders placed or performed in visit on 02/23/17  CoaguChek XS/INR Waived  Result Value Ref Range   INR 2.3 (H) 0.9 - 1.1   Prothrombin Time 27.1 sec  CBC with Differential/Platelet  Result Value Ref Range   WBC 5.8 3.4 - 10.8 x10E3/uL   RBC 4.78 3.77 - 5.28 x10E6/uL   Hemoglobin 13.7 11.1 - 15.9 g/dL   Hematocrit 43.1 34.0 - 46.6 %   MCV 90 79 -  97 fL   MCH 28.7 26.6 - 33.0 pg   MCHC 31.8 31.5 - 35.7 g/dL   RDW 14.6 12.3 - 15.4 %   Platelets 237 150 - 379 x10E3/uL   Neutrophils 64 Not Estab. %   Lymphs 28 Not Estab. %   Monocytes 7 Not Estab. %   Eos 1 Not Estab. %   Basos 0 Not Estab. %   Neutrophils Absolute 3.7 1.4 - 7.0 x10E3/uL   Lymphocytes Absolute 1.6 0.7 - 3.1 x10E3/uL   Monocytes Absolute 0.4 0.1 - 0.9 x10E3/uL   EOS (ABSOLUTE) 0.0 0.0 - 0.4 x10E3/uL   Basophils Absolute 0.0 0.0 - 0.2 x10E3/uL   Immature Granulocytes 0 Not Estab. %   Immature Grans (Abs) 0.0 0.0 - 0.1 x10E3/uL  Comprehensive metabolic panel  Result Value Ref Range   Glucose 242 (H) 65 - 99 mg/dL   BUN  13 8 - 27 mg/dL   Creatinine, Ser 0.80 0.57 - 1.00 mg/dL   GFR calc non Af Amer 69 >59 mL/min/1.73   GFR calc Af Amer 79 >59 mL/min/1.73   BUN/Creatinine Ratio 16 12 - 28   Sodium 140 134 - 144 mmol/L   Potassium 4.9 3.5 - 5.2 mmol/L   Chloride 100 96 - 106 mmol/L   CO2 24 20 - 29 mmol/L   Calcium 10.1 8.7 - 10.3 mg/dL   Total Protein 6.5 6.0 - 8.5 g/dL   Albumin 4.1 3.5 - 4.7 g/dL   Globulin, Total 2.4 1.5 - 4.5 g/dL   Albumin/Globulin Ratio 1.7 1.2 - 2.2   Bilirubin Total 0.4 0.0 - 1.2 mg/dL   Alkaline Phosphatase 106 39 - 117 IU/L   AST 17 0 - 40 IU/L   ALT 20 0 - 32 IU/L  Bayer DCA Hb A1c Waived  Result Value Ref Range   Bayer DCA Hb A1c Waived 8.6 (H) <7.0 %  Lipid Panel w/o Chol/HDL Ratio  Result Value Ref Range   Cholesterol, Total 271 (H) 100 - 199 mg/dL   Triglycerides 204 (H) 0 - 149 mg/dL   HDL 61 >39 mg/dL   VLDL Cholesterol Cal 41 (H) 5 - 40 mg/dL   LDL Calculated 169 (H) 0 - 99 mg/dL      Assessment & Plan:   Problem List Items Addressed This Visit      Unprioritized   Benign hypertension (Chronic)    Restart Amlodipine which she stopped after hearing it might contain something      Monitoring for anticoagulant use (Chronic)    INR 1.9.  Due to high BP I am reluctant to increase her Warfarin.  Will recheck in 1 month       Other Visit Diagnoses    Chronic atrial fibrillation ([place])    -  Primary   Relevant Orders   CoaguChek XS/INR Waived   Acute non-recurrent maxillary sinusitis       Rx for Keflex.  Risk while on coumadin discussed with pt   Relevant Medications   cephALEXin (KEFLEX) 500 MG capsule       Follow up plan: Return in about 4 weeks (around 05/01/2017).

## 2017-04-03 NOTE — Assessment & Plan Note (Signed)
Restart Amlodipine which she stopped after hearing it might contain something

## 2017-05-01 ENCOUNTER — Ambulatory Visit: Payer: Medicare HMO | Admitting: Unknown Physician Specialty

## 2017-05-01 ENCOUNTER — Encounter: Payer: Self-pay | Admitting: Unknown Physician Specialty

## 2017-05-01 VITALS — BP 120/79 | HR 64 | Temp 98.6°F | Wt 207.7 lb

## 2017-05-01 DIAGNOSIS — I10 Essential (primary) hypertension: Secondary | ICD-10-CM

## 2017-05-01 DIAGNOSIS — M7541 Impingement syndrome of right shoulder: Secondary | ICD-10-CM | POA: Diagnosis not present

## 2017-05-01 DIAGNOSIS — M7540 Impingement syndrome of unspecified shoulder: Secondary | ICD-10-CM | POA: Insufficient documentation

## 2017-05-01 DIAGNOSIS — I82403 Acute embolism and thrombosis of unspecified deep veins of lower extremity, bilateral: Secondary | ICD-10-CM | POA: Diagnosis not present

## 2017-05-01 DIAGNOSIS — M25819 Other specified joint disorders, unspecified shoulder: Secondary | ICD-10-CM | POA: Insufficient documentation

## 2017-05-01 DIAGNOSIS — I482 Chronic atrial fibrillation, unspecified: Secondary | ICD-10-CM

## 2017-05-01 LAB — COAGUCHEK XS/INR WAIVED
INR: 2.1 — AB (ref 0.9–1.1)
Prothrombin Time: 24.7 s

## 2017-05-01 MED ORDER — MELOXICAM 15 MG PO TABS: 15.0000 mg | ORAL_TABLET | Freq: Every day | ORAL | 1 refills | Status: DC

## 2017-05-01 MED ORDER — HYDROCHLOROTHIAZIDE 25 MG PO TABS: 25.0000 mg | ORAL_TABLET | Freq: Every day | ORAL | 10 refills | Status: DC

## 2017-05-01 NOTE — Assessment & Plan Note (Signed)
INR 2.1.  Continue present dose

## 2017-05-01 NOTE — Assessment & Plan Note (Signed)
Will schedule for steroid injection.  Rx for a limited script of Mobic.

## 2017-05-01 NOTE — Progress Notes (Signed)
BP (!) 188/81   Pulse 78   Temp 98.6 F (37 C) (Oral)   Wt 207 lb 11.2 oz (94.2 kg)   LMP  (LMP Unknown)   SpO2 99%   BMI 36.22 kg/m    Subjective:    Patient ID: Daisy Watkins, female    DOB: 12-Sep-1932, 84 y.o.   MRN: [id number]  HPI: MALARIE TAPPEN is a 84 y.o. female  Chief Complaint  Patient presents with  . Atrial Fibrillation    PT/ INR- pt taking 6 mg of warfarin daily   Coumadin Taking 6 mg/day.  No bruising, blood in urine or stool, rectal bleeding, or nose bleed.  Diet is consistent   Hypertension Did start Amlodipine but is not taking HCTZ States she is nervous when she comes in but not checking outside the office  No problems or lightheadedness No chest pain with exertion or shortness of breath No Edema  Right shoulder pain Having a lot of right shoulder pain.  Wonders what she can take for it  Cyst Recurrent sebaceous cyst removed by surgery. Has come back and "burst."   Relevant past medical, surgical, family and social history reviewed and updated as indicated. Interim medical history since our last visit reviewed. Allergies and medications reviewed and updated.  Review of Systems  Per HPI unless specifically indicated above     Objective:    BP (!) 188/81   Pulse 78   Temp 98.6 F (37 C) (Oral)   Wt 207 lb 11.2 oz (94.2 kg)   LMP  (LMP Unknown)   SpO2 99%   BMI 36.22 kg/m   Wt Readings from Last 3 Encounters:  05/01/17 207 lb 11.2 oz (94.2 kg)  04/03/17 209 lb (94.8 kg)  02/23/17 205 lb 6.4 oz (93.2 kg)    Physical Exam  Constitutional: She is oriented to person, place, and time. She appears well-developed and well-nourished. No distress.  HENT:  Head: Normocephalic and atraumatic.  Eyes: Conjunctivae and lids are normal. Right eye exhibits no discharge. Left eye exhibits no discharge. No scleral icterus.  Neck: Normal range of motion. Neck supple. No JVD present. Carotid bruit is not present.  Cardiovascular: Normal rate, regular  rhythm and normal heart sounds.  Pulmonary/Chest: Effort normal and breath sounds normal.  Abdominal: Normal appearance. There is no splenomegaly or hepatomegaly.  Musculoskeletal: Normal range of motion.       Right shoulder: She exhibits tenderness. She exhibits no bony tenderness.  Positive impingement   Neurological: She is alert and oriented to person, place, and time.  Skin: Skin is warm, dry and intact. No rash noted. No pallor.  Cyst area looks clean and dry without swelling  Psychiatric: She has a normal mood and affect. Her behavior is normal. Judgment and thought content normal.    Results for orders placed or performed in visit on 04/03/17  CoaguChek XS/INR Waived  Result Value Ref Range   INR 1.9 (H) 0.9 - 1.1   Prothrombin Time 22.4 sec      Assessment & Plan:   Problem List Items Addressed This Visit      Unprioritized   Benign hypertension (Chronic)    Hypertension very poor control.  Restart HCTZ.        Relevant Medications   hydrochlorothiazide (HYDRODIURIL) 25 MG tablet   DVT, lower extremity, recurrent (HCC)    INR 2.1.  Continue present dose      Relevant Medications   hydrochlorothiazide (HYDRODIURIL) 25 MG  tablet   Shoulder impingement    Will schedule for steroid injection.  Rx for a limited script of Mobic.         Other Visit Diagnoses    Chronic atrial fibrillation ([place])    -  Primary   Relevant Medications   hydrochlorothiazide (HYDRODIURIL) 25 MG tablet   Other Relevant Orders   CoaguChek XS/INR Waived      [facility] LPN reviewed her medications with her  Follow up plan: Return for 4 weeks for coumadin and shoulder inj before then.

## 2017-05-01 NOTE — Assessment & Plan Note (Signed)
Hypertension very poor control.  Restart HCTZ.

## 2017-05-22 ENCOUNTER — Encounter: Payer: Self-pay | Admitting: Unknown Physician Specialty

## 2017-05-22 ENCOUNTER — Ambulatory Visit: Payer: Medicare HMO | Admitting: Unknown Physician Specialty

## 2017-05-22 DIAGNOSIS — M62838 Other muscle spasm: Secondary | ICD-10-CM | POA: Insufficient documentation

## 2017-05-22 HISTORY — DX: Other muscle spasm: M62.838

## 2017-05-22 MED ORDER — BACLOFEN 10 MG PO TABS
10.0000 mg | ORAL_TABLET | Freq: Three times a day (TID) | ORAL | 0 refills | Status: DC
Start: 2017-05-22 — End: 2019-01-21

## 2017-05-22 NOTE — Progress Notes (Signed)
BP (!) 144/73 (BP Location: Left Arm, Cuff Size: Normal)   Pulse 81   Temp 98.2 F (36.8 C) (Oral)   Wt 206 lb 12.8 oz (93.8 kg)   LMP  (LMP Unknown)   SpO2 98%   BMI 36.06 kg/m    Subjective:    Patient ID: Daisy Watkins, female    DOB: Aug 09, 1932, 84 y.o.   MRN: [id number]  HPI: Daisy Watkins is a 84 y.o. female  Chief Complaint  Patient presents with  . Shoulder Injection    right shoulder   Shoulder Pain   The pain is present in the neck and right shoulder. This is a new problem. There has been no history of extremity trauma. Episode frequency: comes and goes.  Better today. The quality of the pain is described as aching. Pertinent negatives include no fever, inability to bear weight, itching, joint locking, joint swelling, limited range of motion, numbness, stiffness or tingling. Exacerbated by: Not sure. She has tried nothing for the symptoms.   Area of pain is under cystic area where she had surgery.  No reoccurrence of that noted as without swelling or redness  Relevant past medical, surgical, family and social history reviewed and updated as indicated. Interim medical history since our last visit reviewed. Allergies and medications reviewed and updated.  Review of Systems  Constitutional: Negative for fever.  Musculoskeletal: Negative for stiffness.  Skin: Negative for itching.  Neurological: Negative for tingling and numbness.    Per HPI unless specifically indicated above     Objective:    BP (!) 144/73 (BP Location: Left Arm, Cuff Size: Normal)   Pulse 81   Temp 98.2 F (36.8 C) (Oral)   Wt 206 lb 12.8 oz (93.8 kg)   LMP  (LMP Unknown)   SpO2 98%   BMI 36.06 kg/m   Wt Readings from Last 3 Encounters:  05/22/17 206 lb 12.8 oz (93.8 kg)  05/01/17 207 lb 11.2 oz (94.2 kg)  04/03/17 209 lb (94.8 kg)    Physical Exam  Constitutional: She is oriented to person, place, and time. She appears well-developed and well-nourished. No distress.  HENT:  Head:  Normocephalic and atraumatic.  Eyes: Conjunctivae and lids are normal. Right eye exhibits no discharge. Left eye exhibits no discharge. No scleral icterus.  Neck: Normal range of motion. Neck supple. No JVD present. Carotid bruit is not present.  Cardiovascular: Normal rate, regular rhythm and normal heart sounds.  Pulmonary/Chest: Effort normal and breath sounds normal.  Abdominal: Normal appearance. There is no splenomegaly or hepatomegaly.  Musculoskeletal:       Right shoulder: She exhibits normal range of motion, no tenderness, no bony tenderness, no swelling, no effusion, no crepitus and normal strength.  Neurological: She is alert and oriented to person, place, and time.  Skin: Skin is warm, dry and intact. No rash noted. No pallor.  Psychiatric: She has a normal mood and affect. Her behavior is normal. Judgment and thought content normal.    Results for orders placed or performed in visit on 05/01/17  CoaguChek XS/INR Waived  Result Value Ref Range   INR 2.1 (H) 0.9 - 1.1   Prothrombin Time 24.7 sec      Assessment & Plan:   Problem List Items Addressed This Visit      Unprioritized   Trapezius muscle spasm    New problem.  Intermittent muscle spasm.  Discuss heat.  Tylenol.  Rx for Baclofen to take prn  Follow up plan: Return for INR.

## 2017-05-22 NOTE — Assessment & Plan Note (Signed)
New problem.  Intermittent muscle spasm.  Discuss heat.  Tylenol.  Rx for Baclofen to take prn

## 2017-05-29 ENCOUNTER — Other Ambulatory Visit: Payer: Self-pay | Admitting: Family Medicine

## 2017-05-29 DIAGNOSIS — I480 Paroxysmal atrial fibrillation: Secondary | ICD-10-CM

## 2017-06-05 ENCOUNTER — Ambulatory Visit: Payer: Medicare HMO | Admitting: Unknown Physician Specialty

## 2017-06-16 ENCOUNTER — Ambulatory Visit: Payer: Medicare HMO | Admitting: Unknown Physician Specialty

## 2017-06-18 ENCOUNTER — Other Ambulatory Visit: Payer: Self-pay | Admitting: Unknown Physician Specialty

## 2017-06-18 NOTE — Telephone Encounter (Signed)
Lorazepam refill Last OV: 02/13/17 Last Refill:02/20/17 #30 2 RF [facility] [address] PCP: Kathrine Haddock NP

## 2017-06-19 ENCOUNTER — Telehealth: Payer: Self-pay | Admitting: Unknown Physician Specialty

## 2017-06-19 ENCOUNTER — Ambulatory Visit: Payer: Commercial Managed Care - HMO | Admitting: Family

## 2017-06-19 NOTE — Telephone Encounter (Signed)
Medication already refilled. Just sending to close.

## 2017-06-19 NOTE — Telephone Encounter (Signed)
Medication already refilled 06/18/16. Thanks.

## 2017-06-19 NOTE — Telephone Encounter (Signed)
Per Tiffany Cheek:  Patient came into office for an appointment with pcp. Saw that appointment was scheduled at another practice by [facility]. Unaware if patient sees a provider at the [facility] office; informed patient that appointment was scheduled at [facility] office rather than [facility] office. Patient became very upset. Looked on the schedule for availability for today which was also booked. Attempted to try and keep patient at front desk to get assistance from office manager due to providers being in room with patients. Patient was very upset and continued to walk away and return back to desk in regards to appt mishap. Patient was very upset with office staff at the moment. Explained to patient that we work with individuals at the [facility] that help make appointments and expressed apologies on behalf of [facility] and office as a whole. Patient stated she would rather call back in regards to rescheduling her appointment. Continued to express apologies and ask about getting manager to assist as patient walked out of office.     Contacted patient to apologize and discuss scheduling error.  [facility] requesting the patient call back.  Will attempt to contact patient again on Monday to get her rescheduled for INR.--K Javier Glazier

## 2017-06-22 ENCOUNTER — Encounter: Payer: Self-pay | Admitting: Family Medicine

## 2017-06-22 ENCOUNTER — Ambulatory Visit: Payer: Medicare HMO | Admitting: Family Medicine

## 2017-06-22 VITALS — BP 169/77 | HR 73 | Temp 98.1°F | Wt 208.0 lb

## 2017-06-22 DIAGNOSIS — I482 Chronic atrial fibrillation, unspecified: Secondary | ICD-10-CM

## 2017-06-22 LAB — COAGUCHEK XS/INR WAIVED
INR: 2.9 — ABNORMAL HIGH (ref 0.9–1.1)
PROTHROMBIN TIME: 34.6 s

## 2017-06-22 NOTE — Telephone Encounter (Signed)
-----   Message from Donne Anon sent at 06/19/2017  3:59 PM EDT ----- Regarding: Appointment Scheduling Patient came into office for an appointment with pcp. Saw that appointment was scheduled at another practice by [facility]. Unaware if patient sees a provider at the [facility] office; informed patient that appointment was scheduled at [facility] office rather than [facility] office. Patient became very upset. Looked on the schedule for availability for today which was also booked. Attempted to try and keep patient at front desk to get assistance from office manager due to providers being in room with patients. Patient was very upset and continued to walk away and return back to desk in regards to appt mishap. Patient was very upset with office staff at the moment. Explained to patient that we work with individuals at the [facility] that help make appointments and expressed apologies on behalf of [place] and office as a whole. Patient stated she would rather call back in regards to rescheduling her appointment. Continued to express apologies and ask about getting manager to assist as patient walked out of office.

## 2017-06-22 NOTE — Progress Notes (Signed)
   BP (!) 169/77 (BP Location: Left Arm, Patient Position: Sitting, Cuff Size: Normal)   Pulse 73   Temp 98.1 F (36.7 C) (Oral)   Wt 208 lb (94.3 kg)   LMP  (LMP Unknown)   SpO2 97%   BMI 36.27 kg/m    Subjective:    Patient ID: Daisy Watkins, female    DOB: [birth year], 84 y.o.   MRN: [id number]  HPI: Daisy Watkins is a 84 y.o. female  Chief Complaint  Patient presents with  . Coagulation Disorder   Pt here for INR f/u today. Taking 6 mg about 3 days a week, 4 mg the other days. States she adjusts her dose by "how she feels that day".  Previous INR almost 2 months ago was 2.1. Denies any new medicines, dietary changes, bleeding or bruising issues, CP, SOB.   Relevant past medical, surgical, family and social history reviewed and updated as indicated. Interim medical history since our last visit reviewed. Allergies and medications reviewed and updated.  Review of Systems  Per HPI unless specifically indicated above     Objective:    BP (!) 169/77 (BP Location: Left Arm, Patient Position: Sitting, Cuff Size: Normal)   Pulse 73   Temp 98.1 F (36.7 C) (Oral)   Wt 208 lb (94.3 kg)   LMP  (LMP Unknown)   SpO2 97%   BMI 36.27 kg/m   Wt Readings from Last 3 Encounters:  06/22/17 208 lb (94.3 kg)  05/22/17 206 lb 12.8 oz (93.8 kg)  05/01/17 207 lb 11.2 oz (94.2 kg)    Physical Exam  Constitutional: She is oriented to person, place, and time. She appears well-developed and well-nourished. No distress.  HENT:  Head: Atraumatic.  Eyes: Pupils are equal, round, and reactive to light. Conjunctivae are normal.  Neck: Normal range of motion. Neck supple.  Cardiovascular: Normal rate.  Pulmonary/Chest: Effort normal and breath sounds normal.  Musculoskeletal: Normal range of motion.  Neurological: She is alert and oriented to person, place, and time.  Skin: Skin is warm and dry.  Psychiatric: She has a normal mood and affect. Her behavior is normal.  Nursing note and vitals  reviewed.   Results for orders placed or performed in visit on 06/22/17  CoaguChek XS/INR Waived  Result Value Ref Range   INR 2.9 (H) 0.9 - 1.1   Prothrombin Time 34.6 sec      Assessment & Plan:   Problem List Items Addressed This Visit    None    Visit Diagnoses    Chronic atrial fibrillation ([place])    -  Primary   INR at goal today at 2.9, continue current regimen   Relevant Orders   CoaguChek XS/INR Waived (Completed)       Follow up plan: Return in about 1 month (around 07/20/2017) for INR.

## 2017-06-22 NOTE — Telephone Encounter (Signed)
LMOM for patient to call back to reschedule appointment for INR.

## 2017-06-24 NOTE — Patient Instructions (Signed)
Follow up in 1 month   

## 2017-06-27 ENCOUNTER — Other Ambulatory Visit: Payer: Self-pay | Admitting: Family Medicine

## 2017-06-29 NOTE — Telephone Encounter (Signed)
Refill for Novolin 70-30  Med expired on 06/13/17  Provider:  Kathrine Haddock, NP  LOV:  04/03/17  Last HA1C 02/23/17 was 8.6  Please review.

## 2017-07-13 ENCOUNTER — Encounter: Payer: Self-pay | Admitting: Unknown Physician Specialty

## 2017-07-15 ENCOUNTER — Ambulatory Visit (INDEPENDENT_AMBULATORY_CARE_PROVIDER_SITE_OTHER): Payer: Medicare HMO | Admitting: Unknown Physician Specialty

## 2017-07-15 ENCOUNTER — Encounter: Payer: Self-pay | Admitting: Unknown Physician Specialty

## 2017-07-15 VITALS — BP 162/72 | HR 66 | Temp 99.8°F | Ht 63.5 in | Wt 206.4 lb

## 2017-07-15 DIAGNOSIS — G8929 Other chronic pain: Secondary | ICD-10-CM

## 2017-07-15 DIAGNOSIS — Z7901 Long term (current) use of anticoagulants: Secondary | ICD-10-CM

## 2017-07-15 DIAGNOSIS — E1165 Type 2 diabetes mellitus with hyperglycemia: Secondary | ICD-10-CM

## 2017-07-15 DIAGNOSIS — I482 Chronic atrial fibrillation, unspecified: Secondary | ICD-10-CM

## 2017-07-15 DIAGNOSIS — IMO0001 Reserved for inherently not codable concepts without codable children: Secondary | ICD-10-CM

## 2017-07-15 DIAGNOSIS — I10 Essential (primary) hypertension: Secondary | ICD-10-CM | POA: Diagnosis not present

## 2017-07-15 DIAGNOSIS — M62838 Other muscle spasm: Secondary | ICD-10-CM | POA: Diagnosis not present

## 2017-07-15 DIAGNOSIS — M25511 Pain in right shoulder: Secondary | ICD-10-CM

## 2017-07-15 DIAGNOSIS — Z5181 Encounter for therapeutic drug level monitoring: Secondary | ICD-10-CM

## 2017-07-15 LAB — COAGUCHEK XS/INR WAIVED
INR: 2.6 — ABNORMAL HIGH (ref 0.9–1.1)
PROTHROMBIN TIME: 31.3 s

## 2017-07-15 LAB — BAYER DCA HB A1C WAIVED: HB A1C (BAYER DCA - WAIVED): 8.1 % — ABNORMAL HIGH (ref <7.0)

## 2017-07-15 MED ORDER — FLUTICASONE PROPIONATE 50 MCG/ACT NA SUSP: 2.0000 | Freq: Every day | NASAL | 12 refills | Status: DC

## 2017-07-15 MED ORDER — GLIPIZIDE ER 10 MG PO TB24: 10.0000 mg | ORAL_TABLET | Freq: Every day | ORAL | 1 refills | Status: DC

## 2017-07-15 NOTE — Assessment & Plan Note (Signed)
INR 2.6.  This is at goal.  Recheck 1 month

## 2017-07-15 NOTE — Assessment & Plan Note (Signed)
States she did not take all her medications.  Will work on compliance

## 2017-07-15 NOTE — Assessment & Plan Note (Signed)
Hgb AiC is improved.  Pt states she thinks she can continue to work on this.  Increase Glipizide to 10 mg

## 2017-07-15 NOTE — Assessment & Plan Note (Addendum)
Spasm and tenderness throughout right trapezius muscle but particularly over old surgery site.  Will get chest x-ray.  ? Needs CT scan

## 2017-07-15 NOTE — Progress Notes (Signed)
BP (!) 162/72 (BP Location: Left Arm, Cuff Size: Normal)   Pulse 66   Temp 99.8 F (37.7 C) (Oral)   Ht 5' 3.5" (1.613 m)   Wt 206 lb 6.4 oz (93.6 kg)   LMP  (LMP Unknown)   SpO2 96%   BMI 35.99 kg/m    Subjective:    Patient ID: Daisy Watkins, female    DOB: [birth year], 84 y.o.   MRN: [id number]  HPI: Daisy Watkins is a 84 y.o. female  Chief Complaint  Patient presents with  . Atrial Fibrillation    PT/ INR- pt states she is taking 7 mg of warfarin daily  . Pain    pt states she has a pinched nerve or something in her right shoulder and arm area that she would like checked   Anti-coagulant Pt states she is now taking Warfarin 7 mg daily.  She has been taking this for about a week now.  No nose bleeds or blood in her urine but notices she is urinating and itching a lot.  Wonders if she has a UTI.  No bruising, new medications, or dietary changes.    Hypertension Using medications without difficulty.  High today.  Pt thinks it's related to pain due to pinched nerve Average home BPs  136/70   No problems or lightheadedness No chest pain with exertion or shortness of breath No Edema  Shoulder pain Pt with right shoulder and neck pain.  I diagnosed her with an trapezial muscle spasm on 2/22 and given Baclofen.  This did not help. However, great deal of pain around a scar right upper back where a lipoma was removed.  States it started hurting after falling through steps  Diabetes: Using medications without difficulties.  Was put on Glipizide on 11/26 and states she is taking it.  Takes 45 u  Of 70/30 in the AM and 35 in the evening No hypoglycemic episodes No hyperglycemic episodes Feet problems: none Blood Sugars averaging:164 Last Hgb A1C: 8.6  Hypertension  Using medications without difficulty Average home BPs   Using medication without problems or lightheadedness No chest pain with exertion or shortness of breath No Edema  Elevated Cholesterol Using medications  without problems No Muscle aches  Diet: Exercise: Still works  Relevant past medical, surgical, family and social history reviewed and updated as indicated. Interim medical history since our last visit reviewed. Allergies and medications reviewed and updated.  Review of Systems  Per HPI unless specifically indicated above     Objective:    BP (!) 162/72 (BP Location: Left Arm, Cuff Size: Normal)   Pulse 66   Temp 99.8 F (37.7 C) (Oral)   Ht 5' 3.5" (1.613 m)   Wt 206 lb 6.4 oz (93.6 kg)   LMP  (LMP Unknown)   SpO2 96%   BMI 35.99 kg/m   Wt Readings from Last 3 Encounters:  07/15/17 206 lb 6.4 oz (93.6 kg)  06/22/17 208 lb (94.3 kg)  05/22/17 206 lb 12.8 oz (93.8 kg)    Physical Exam  Constitutional: She is oriented to person, place, and time. She appears well-developed and well-nourished. No distress.  HENT:  Head: Normocephalic and atraumatic.  Eyes: Conjunctivae and lids are normal. Right eye exhibits no discharge. Left eye exhibits no discharge. No scleral icterus.  Neck: Normal range of motion. Neck supple. No JVD present. Carotid bruit is not present.  Cardiovascular: Normal rate, regular rhythm and normal heart sounds.  Pulmonary/Chest: Effort normal  and breath sounds normal.  Abdominal: Normal appearance. There is no splenomegaly or hepatomegaly.  Musculoskeletal: Normal range of motion.  Neurological: She is alert and oriented to person, place, and time.  Skin: Skin is warm, dry and intact. No rash noted. No pallor.  Psychiatric: She has a normal mood and affect. Her behavior is normal. Judgment and thought content normal.    Results for orders placed or performed in visit on 06/22/17  CoaguChek XS/INR Waived  Result Value Ref Range   INR 2.9 (H) 0.9 - 1.1   Prothrombin Time 34.6 sec      Assessment & Plan:   Problem List Items Addressed This Visit      Unprioritized   Benign hypertension (Chronic)    States she did not take all her medications.  Will  work on compliance      Diabetes mellitus type 2, uncontrolled, without complications (HCC) (Chronic)    Hgb AiC is improved.  Pt states she thinks she can continue to work on this.  Increase Glipizide to 10 mg      Relevant Medications   glipiZIDE (GLIPIZIDE XL) 10 MG 24 hr tablet   Other Relevant Orders   Comprehensive metabolic panel   Bayer DCA Hb A1c Waived   Encounter for monitoring Coumadin therapy    INR 2.6.  This is at goal.  Recheck 1 month      Trapezius muscle spasm    Spasm and tenderness throughout right trapezius muscle but particularly over old surgery site.  Will get chest x-ray.  ? Needs CT scan       Other Visit Diagnoses    Chronic atrial fibrillation ([place])    -  Primary   Relevant Orders   CoaguChek XS/INR Waived   Chronic right shoulder pain       Relevant Orders   DG Chest 2 View       Follow up plan: Return in about 1 month (around 08/12/2017).

## 2017-07-16 LAB — COMPREHENSIVE METABOLIC PANEL
ALBUMIN: 4 g/dL (ref 3.5–4.7)
ALT: 24 IU/L (ref 0–32)
AST: 21 IU/L (ref 0–40)
Albumin/Globulin Ratio: 1.9 (ref 1.2–2.2)
Alkaline Phosphatase: 108 IU/L (ref 39–117)
BILIRUBIN TOTAL: 0.5 mg/dL (ref 0.0–1.2)
BUN / CREAT RATIO: 16 (ref 12–28)
BUN: 11 mg/dL (ref 8–27)
CO2: 25 mmol/L (ref 20–29)
Calcium: 9.8 mg/dL (ref 8.7–10.3)
Chloride: 98 mmol/L (ref 96–106)
Creatinine, Ser: 0.7 mg/dL (ref 0.57–1.00)
GFR calc non Af Amer: 81 mL/min/{1.73_m2} (ref >59)
GFR, EST AFRICAN AMERICAN: 93 mL/min/{1.73_m2} (ref >59)
GLUCOSE: 279 mg/dL — AB (ref 65–99)
Globulin, Total: 2.1 g/dL (ref 1.5–4.5)
Potassium: 4.8 mmol/L (ref 3.5–5.2)
Sodium: 138 mmol/L (ref 134–144)
Total Protein: 6.1 g/dL (ref 6.0–8.5)

## 2017-07-22 ENCOUNTER — Ambulatory Visit: Payer: Medicare HMO | Admitting: Unknown Physician Specialty

## 2017-08-14 ENCOUNTER — Encounter: Payer: Self-pay | Admitting: Unknown Physician Specialty

## 2017-08-14 ENCOUNTER — Ambulatory Visit: Payer: Medicare HMO | Admitting: Unknown Physician Specialty

## 2017-08-14 VITALS — BP 177/69 | HR 79 | Temp 98.6°F | Ht 63.5 in | Wt 207.6 lb

## 2017-08-14 DIAGNOSIS — R350 Frequency of micturition: Secondary | ICD-10-CM

## 2017-08-14 DIAGNOSIS — I480 Paroxysmal atrial fibrillation: Secondary | ICD-10-CM | POA: Diagnosis not present

## 2017-08-14 DIAGNOSIS — M62838 Other muscle spasm: Secondary | ICD-10-CM | POA: Diagnosis not present

## 2017-08-14 DIAGNOSIS — I482 Chronic atrial fibrillation, unspecified: Secondary | ICD-10-CM

## 2017-08-14 DIAGNOSIS — Z5181 Encounter for therapeutic drug level monitoring: Secondary | ICD-10-CM

## 2017-08-14 DIAGNOSIS — Z7901 Long term (current) use of anticoagulants: Secondary | ICD-10-CM | POA: Diagnosis not present

## 2017-08-14 DIAGNOSIS — F411 Generalized anxiety disorder: Secondary | ICD-10-CM | POA: Diagnosis not present

## 2017-08-14 LAB — UA/M W/RFLX CULTURE, ROUTINE
Bilirubin, UA: NEGATIVE
KETONES UA: NEGATIVE
LEUKOCYTES UA: NEGATIVE
Nitrite, UA: NEGATIVE
Protein, UA: NEGATIVE
RBC UA: NEGATIVE
SPEC GRAV UA: 1.01 (ref 1.005–1.030)
Urobilinogen, Ur: 1 mg/dL (ref 0.2–1.0)
pH, UA: 5 (ref 5.0–7.5)

## 2017-08-14 LAB — COAGUCHEK XS/INR WAIVED
INR: 1.9 — AB (ref 0.9–1.1)
PROTHROMBIN TIME: 22.3 s

## 2017-08-14 MED ORDER — DICLOFENAC SODIUM 1 % TD GEL: 4.0000 g | Freq: Four times a day (QID) | TRANSDERMAL | 3 refills | Status: DC

## 2017-08-14 MED ORDER — WARFARIN SODIUM 6 MG PO TABS: 6.0000 mg | ORAL_TABLET | Freq: Every day | ORAL | 2 refills | Status: DC

## 2017-08-14 MED ORDER — INSULIN NPH ISOPHANE & REGULAR (70-30) 100 UNIT/ML ~~LOC~~ SUSP: SUBCUTANEOUS | 11 refills | Status: DC

## 2017-08-14 NOTE — Assessment & Plan Note (Signed)
Takes nightly Lorazepam.  Will call when she is out

## 2017-08-14 NOTE — Progress Notes (Signed)
BP (!) 177/69   Pulse 79   Temp 98.6 F (37 C) (Oral)   Ht 5' 3.5" (1.613 m)   Wt 207 lb 9.6 oz (94.2 kg)   LMP  (LMP Unknown)   SpO2 97%   BMI 36.20 kg/m    Subjective:    Patient ID: Daisy Watkins, female    DOB: [birth year], 84 y.o.   MRN: [id number]  HPI: Daisy Watkins is a 84 y.o. female  Chief Complaint  Patient presents with  . Atrial Fibrillation    pt taking 7 mg of warfarin daily   Coumadin/A-fib Pt is here to f/u on her Coumadin therapy.  She is currently taking 7 mg (6mg +1mg ) daily.   No nose bleeds, blood in urine, or vaginal bleeding.  Eats occasional greens.  No recent antibiotics.     Hypertension Using medications without difficulty.  Very high today.  States it is high every time she comes.   Average home BPs SBP 130s   No problems or lightheadedness No chest pain with exertion or shortness of breath No Edema  Anxiety Takes Lorazepam nightly due to difficulty sleeping.    Urinary frequency Long standing.  Would like to get her urine checked  Shoulder pain Right shoulder.  Evaluated in the past and determined to be related to muscle spasm and not a rotator cuff.  She is wondering if there is an ointment that might work    Need for insulin refill - DM check done 06/2017.  Due for another check in July    Relevant past medical, surgical, family and social history reviewed and updated as indicated. Interim medical history since our last visit reviewed. Allergies and medications reviewed and updated.  Review of Systems  Per HPI unless specifically indicated above     Objective:    BP (!) 177/69   Pulse 79   Temp 98.6 F (37 C) (Oral)   Ht 5' 3.5" (1.613 m)   Wt 207 lb 9.6 oz (94.2 kg)   LMP  (LMP Unknown)   SpO2 97%   BMI 36.20 kg/m   Wt Readings from Last 3 Encounters:  08/14/17 207 lb 9.6 oz (94.2 kg)  07/15/17 206 lb 6.4 oz (93.6 kg)  06/22/17 208 lb (94.3 kg)    Physical Exam  Constitutional: She is oriented to person, place, and  time. She appears well-developed and well-nourished. No distress.  HENT:  Head: Normocephalic and atraumatic.  Eyes: Conjunctivae and lids are normal. Right eye exhibits no discharge. Left eye exhibits no discharge. No scleral icterus.  Neck: Normal range of motion. Neck supple. No JVD present. Carotid bruit is not present.  Cardiovascular: An irregularly irregular rhythm present.  Pulmonary/Chest: Effort normal and breath sounds normal.  Abdominal: Normal appearance. There is no splenomegaly or hepatomegaly.  Musculoskeletal: Normal range of motion.  Neurological: She is alert and oriented to person, place, and time.  Skin: Skin is warm, dry and intact. No rash noted. No pallor.  Psychiatric: She has a normal mood and affect. Her behavior is normal. Judgment and thought content normal.    Results for orders placed or performed in visit on 07/15/17  CoaguChek XS/INR Waived  Result Value Ref Range   INR 2.6 (H) 0.9 - 1.1   Prothrombin Time 31.3 sec  Comprehensive metabolic panel  Result Value Ref Range   Glucose 279 (H) 65 - 99 mg/dL   BUN 11 8 - 27 mg/dL   Creatinine, Ser 0.70 0.57 -  1.00 mg/dL   GFR calc non Af Amer 81 >59 mL/min/1.73   GFR calc Af Amer 93 >59 mL/min/1.73   BUN/Creatinine Ratio 16 12 - 28   Sodium 138 134 - 144 mmol/L   Potassium 4.8 3.5 - 5.2 mmol/L   Chloride 98 96 - 106 mmol/L   CO2 25 20 - 29 mmol/L   Calcium 9.8 8.7 - 10.3 mg/dL   Total Protein 6.1 6.0 - 8.5 g/dL   Albumin 4.0 3.5 - 4.7 g/dL   Globulin, Total 2.1 1.5 - 4.5 g/dL   Albumin/Globulin Ratio 1.9 1.2 - 2.2   Bilirubin Total 0.5 0.0 - 1.2 mg/dL   Alkaline Phosphatase 108 39 - 117 IU/L   AST 21 0 - 40 IU/L   ALT 24 0 - 32 IU/L  Bayer DCA Hb A1c Waived  Result Value Ref Range   Bayer DCA Hb A1c Waived 8.1 (H) <7.0 %      Assessment & Plan:   Problem List Items Addressed This Visit      Unprioritized   Anxiety, generalized    Takes nightly Lorazepam.  Will call when she is out       Monitoring for anticoagulant use (Chronic)    INR is 1.9.  Not ideal at 2-3, but this varies widely and I feel it's safest to continue with present dose      Trapezius muscle spasm    Try Voltaren gel.  Reassess next visit for rotator cuff.       Urinary frequency    Urine is normal.  Encouraged good blood sugar control      Relevant Orders   UA/M w/rflx Culture, Routine    Other Visit Diagnoses    Chronic atrial fibrillation ([place])    -  Primary   Relevant Medications   warfarin (COUMADIN) 6 MG tablet   Other Relevant Orders   CoaguChek XS/INR Waived   Paroxysmal atrial fibrillation (HCC)       Relevant Medications   warfarin (COUMADIN) 6 MG tablet       Follow up plan: Return in about 1 month (around 09/11/2017).

## 2017-08-14 NOTE — Assessment & Plan Note (Signed)
Try Voltaren gel.  Reassess next visit for rotator cuff.

## 2017-08-14 NOTE — Assessment & Plan Note (Signed)
INR is 1.9.  Not ideal at 2-3, but this varies widely and I feel it's safest to continue with present dose

## 2017-08-14 NOTE — Assessment & Plan Note (Signed)
Urine is normal.  Encouraged good blood sugar control

## 2017-08-19 ENCOUNTER — Other Ambulatory Visit: Payer: Self-pay | Admitting: Unknown Physician Specialty

## 2017-08-20 ENCOUNTER — Other Ambulatory Visit: Payer: Self-pay

## 2017-08-20 NOTE — Telephone Encounter (Signed)
Message forwarded to Provider.

## 2017-08-20 NOTE — Telephone Encounter (Addendum)
lorazepam refill Last OV: 08/14/17 Last Refill:06/18/17 #30 tab 1 RF [facility] [address]. PCP: Kathrine Haddock NP

## 2017-08-20 NOTE — Telephone Encounter (Signed)
Pt is calling checking on refill request. She is out of the medication.

## 2017-08-20 NOTE — Telephone Encounter (Signed)
Last OV: 08/14/2017

## 2017-08-21 MED ORDER — LORAZEPAM 1 MG PO TABS: 1.0000 mg | ORAL_TABLET | Freq: Every evening | ORAL | 1 refills | Status: DC | PRN

## 2017-09-11 ENCOUNTER — Ambulatory Visit: Payer: Medicare HMO | Admitting: Family Medicine

## 2017-09-11 ENCOUNTER — Ambulatory Visit: Payer: Medicare HMO

## 2017-09-16 ENCOUNTER — Other Ambulatory Visit: Payer: Self-pay | Admitting: Family Medicine

## 2017-09-16 ENCOUNTER — Encounter: Payer: Self-pay | Admitting: Unknown Physician Specialty

## 2017-09-16 DIAGNOSIS — I480 Paroxysmal atrial fibrillation: Secondary | ICD-10-CM

## 2017-09-18 ENCOUNTER — Other Ambulatory Visit: Payer: Self-pay

## 2017-09-18 DIAGNOSIS — I480 Paroxysmal atrial fibrillation: Secondary | ICD-10-CM

## 2017-09-18 MED ORDER — WARFARIN SODIUM 1 MG PO TABS: ORAL_TABLET | ORAL | 3 refills | Status: DC

## 2017-09-18 NOTE — Telephone Encounter (Signed)
Warfarin (coumadin) refill Last Refill:01/23/17 # 30 3 RF Last OV: 08/14/17 PCP: Kathrine Haddock NP [facility]

## 2017-10-09 ENCOUNTER — Other Ambulatory Visit: Payer: Self-pay | Admitting: Unknown Physician Specialty

## 2017-10-09 ENCOUNTER — Ambulatory Visit: Payer: Medicare HMO

## 2017-10-09 NOTE — Telephone Encounter (Signed)
BD insulin syringes refill Last Refill:08/27/15 # 60 12 RF Last OV: 08/14/17 PCP: Kathrine Haddock NP [facility]

## 2017-10-12 ENCOUNTER — Ambulatory Visit (INDEPENDENT_AMBULATORY_CARE_PROVIDER_SITE_OTHER): Payer: Medicare HMO

## 2017-10-12 ENCOUNTER — Ambulatory Visit: Payer: Medicare HMO | Admitting: Family Medicine

## 2017-10-12 VITALS — BP 136/72 | HR 80 | Temp 98.1°F | Resp 16 | Ht 63.0 in | Wt 210.0 lb

## 2017-10-12 DIAGNOSIS — Z0000 Encounter for general adult medical examination without abnormal findings: Secondary | ICD-10-CM

## 2017-10-12 NOTE — Patient Instructions (Signed)
Daisy Watkins , Thank you for taking time to come for your Medicare Wellness Visit. I appreciate your ongoing commitment to your health goals. Please review the following plan we discussed and let me know if I can assist you in the future.   Screening recommendations/referrals: Colonoscopy: no longer required Mammogram: no longer required Bone Density: completed 2012, no loner required Recommended yearly ophthalmology/optometry visit for glaucoma screening and checkup Recommended yearly dental visit for hygiene and checkup  Vaccinations: Influenza vaccine: due 11/2017 Pneumococcal vaccine: completed by another office.  Tdap vaccine: due, check with your insurance company for coverage  Shingles vaccine: shingrix eligible, check with your insurance company for coverage     Advanced directives: Advance directive discussed with you today. I have provided a copy for you to complete at home and have notarized. Once this is complete please bring a copy in to our office so we can scan it into your chart.  Conditions/risks identified:  Recommend drinking at least 6-8 glasses of water a day   Next appointment: Follow up on 10/15/2017 at 3:45pm with Dr.Johnson. Follow up in one year for your annual wellness exam.    Preventive Care 65 Years and Older, Female Preventive care refers to lifestyle choices and visits with your health care provider that can promote health and wellness. What does preventive care include?  A yearly physical exam. This is also called an annual well check.  Dental exams once or twice a year.  Routine eye exams. Ask your health care provider how often you should have your eyes checked.  Personal lifestyle choices, including:  Daily care of your teeth and gums.  Regular physical activity.  Eating a healthy diet.  Avoiding tobacco and drug use.  Limiting alcohol use.  Practicing safe sex.  Taking low-dose aspirin every day.  Taking vitamin and mineral supplements  as recommended by your health care provider. What happens during an annual well check? The services and screenings done by your health care provider during your annual well check will depend on your age, overall health, lifestyle risk factors, and family history of disease. Counseling  Your health care provider may ask you questions about your:  Alcohol use.  Tobacco use.  Drug use.  Emotional well-being.  Home and relationship well-being.  Sexual activity.  Eating habits.  History of falls.  Memory and ability to understand (cognition).  Work and work Statistician.  Reproductive health. Screening  You may have the following tests or measurements:  Height, weight, and BMI.  Blood pressure.  Lipid and cholesterol levels. These may be checked every 5 years, or more frequently if you are over 66 years old.  Skin check.  Lung cancer screening. You may have this screening every year starting at age 85 if you have a 30-pack-year history of smoking and currently smoke or have quit within the past 15 years.  Fecal occult blood test (FOBT) of the stool. You may have this test every year starting at age 55.  Flexible sigmoidoscopy or colonoscopy. You may have a sigmoidoscopy every 5 years or a colonoscopy every 10 years starting at age 62.  Hepatitis C blood test.  Hepatitis B blood test.  Sexually transmitted disease (STD) testing.  Diabetes screening. This is done by checking your blood sugar (glucose) after you have not eaten for a while (fasting). You may have this done every 1-3 years.  Bone density scan. This is done to screen for osteoporosis. You may have this done starting at age 61.  Mammogram. This may be done every 1-2 years. Talk to your health care provider about how often you should have regular mammograms. Talk with your health care provider about your test results, treatment options, and if necessary, the need for more tests. Vaccines  Your health care  provider may recommend certain vaccines, such as:  Influenza vaccine. This is recommended every year.  Tetanus, diphtheria, and acellular pertussis (Tdap, Td) vaccine. You may need a Td booster every 10 years.  Zoster vaccine. You may need this after age 38.  Pneumococcal 13-valent conjugate (PCV13) vaccine. One dose is recommended after age 91.  Pneumococcal polysaccharide (PPSV23) vaccine. One dose is recommended after age 29. Talk to your health care provider about which screenings and vaccines you need and how often you need them. This information is not intended to replace advice given to you by your health care provider. Make sure you discuss any questions you have with your health care provider. Document Released: 04/13/2015 Document Revised: 12/05/2015 Document Reviewed: 01/16/2015 Elsevier Interactive Patient Education  2017 [employer] Prevention in the Home Falls can cause injuries. They can happen to people of all ages. There are many things you can do to make your home safe and to help prevent falls. What can I do on the outside of my home?  Regularly fix the edges of walkways and driveways and fix any cracks.  Remove anything that might make you trip as you walk through a door, such as a raised step or threshold.  Trim any bushes or trees on the path to your home.  Use bright outdoor lighting.  Clear any walking paths of anything that might make someone trip, such as rocks or tools.  Regularly check to see if handrails are loose or broken. Make sure that both sides of any steps have handrails.  Any raised decks and porches should have guardrails on the edges.  Have any leaves, snow, or ice cleared regularly.  Use sand or salt on walking paths during winter.  Clean up any spills in your garage right away. This includes oil or grease spills. What can I do in the bathroom?  Use night lights.  Install grab bars by the toilet and in the tub and shower. Do  not use towel bars as grab bars.  Use non-skid mats or decals in the tub or shower.  If you need to sit down in the shower, use a plastic, non-slip stool.  Keep the floor dry. Clean up any water that spills on the floor as soon as it happens.  Remove soap buildup in the tub or shower regularly.  Attach bath mats securely with double-sided non-slip rug tape.  Do not have throw rugs and other things on the floor that can make you trip. What can I do in the bedroom?  Use night lights.  Make sure that you have a light by your bed that is easy to reach.  Do not use any sheets or blankets that are too big for your bed. They should not hang down onto the floor.  Have a firm chair that has side arms. You can use this for support while you get dressed.  Do not have throw rugs and other things on the floor that can make you trip. What can I do in the kitchen?  Clean up any spills right away.  Avoid walking on wet floors.  Keep items that you use a lot in easy-to-reach places.  If you need to reach  something above you, use a strong step stool that has a grab bar.  Keep electrical cords out of the way.  Do not use floor polish or wax that makes floors slippery. If you must use wax, use non-skid floor wax.  Do not have throw rugs and other things on the floor that can make you trip. What can I do with my stairs?  Do not leave any items on the stairs.  Make sure that there are handrails on both sides of the stairs and use them. Fix handrails that are broken or loose. Make sure that handrails are as long as the stairways.  Check any carpeting to make sure that it is firmly attached to the stairs. Fix any carpet that is loose or worn.  Avoid having throw rugs at the top or bottom of the stairs. If you do have throw rugs, attach them to the floor with carpet tape.  Make sure that you have a light switch at the top of the stairs and the bottom of the stairs. If you do not have them,  ask someone to add them for you. What else can I do to help prevent falls?  Wear shoes that:  Do not have high heels.  Have rubber bottoms.  Are comfortable and fit you well.  Are closed at the toe. Do not wear sandals.  If you use a stepladder:  Make sure that it is fully opened. Do not climb a closed stepladder.  Make sure that both sides of the stepladder are locked into place.  Ask someone to hold it for you, if possible.  Clearly mark and make sure that you can see:  Any grab bars or handrails.  First and last steps.  Where the edge of each step is.  Use tools that help you move around (mobility aids) if they are needed. These include:  Canes.  Walkers.  Scooters.  Crutches.  Turn on the lights when you go into a dark area. Replace any light bulbs as soon as they burn out.  Set up your furniture so you have a clear path. Avoid moving your furniture around.  If any of your floors are uneven, fix them.  If there are any pets around you, be aware of where they are.  Review your medicines with your doctor. Some medicines can make you feel dizzy. This can increase your chance of falling. Ask your doctor what other things that you can do to help prevent falls. This information is not intended to replace advice given to you by your health care provider. Make sure you discuss any questions you have with your health care provider. Document Released: 01/11/2009 Document Revised: 08/23/2015 Document Reviewed: 04/21/2014 Elsevier Interactive Patient Education  2017 [employer].

## 2017-10-12 NOTE — Progress Notes (Signed)
Subjective:   Daisy Watkins is a 84 y.o. female who presents for Medicare Annual (Subsequent) preventive examination.  Review of Systems:   Cardiac Risk Factors include: advanced age (>30men, >76 women);obesity (BMI >30kg/m2);hypertension;dyslipidemia;diabetes mellitus     Objective:     Vitals: BP 136/72 (BP Location: Left Arm, Patient Position: Sitting)   Pulse 80   Temp 98.1 F (36.7 C) (Temporal)   Resp 16   Ht 5\' 3"  (1.6 m)   Wt 210 lb (95.3 kg)   LMP  (LMP Unknown)   BMI 37.20 kg/m   Body mass index is 37.2 kg/m.  Advanced Directives 10/12/2017 11/05/2016 10/24/2016 09/05/2016  Does Patient Have a Medical Advance Directive? No No Yes No  Type of Advance Directive - Public librarian -  Does patient want to make changes to medical advance directive? - No - Patient declined No - Patient declined -  Copy of [place] in Chart? - No - copy requested No - copy requested -  Would patient like information on creating a medical advance directive? Yes (MAU/Ambulatory/Procedural Areas - Information given) - - Yes (MAU/Ambulatory/Procedural Areas - Information given)    Tobacco Social History   Tobacco Use  Smoking Status Former Smoker  . Packs/day: 0.50  . Years: 30.00  . Pack years: 15.00  . Types: Cigarettes  . Last attempt to quit: 03/31/1977  . Years since quitting: 40.5  Smokeless Tobacco Never Used     Counseling given: Not Answered   Clinical Intake:  Pre-visit preparation completed: Yes  Pain : No/denies pain     Nutritional Status: BMI > 30  Obese Nutritional Risks: None Diabetes: Yes CBG done?: No Did pt. bring in CBG monitor from home?: No  How often do you need to have someone help you when you read instructions, pamphlets, or other written materials from your doctor or pharmacy?: 1 - Never What is the last grade level you completed in school?: GED  Interpreter Needed?: No  Information entered by :: Tiffany Hill,LPN     Past Medical History:  Diagnosis Date  . Anxiety   . Diabetes mellitus without complication ([place])   . DVT (deep venous thrombosis) ([place]) 2006   chronic anticoagulation  . GERD (gastroesophageal reflux disease)   . Hyperlipidemia   . Hypertension   . Insomnia   . Vitamin B12 deficiency   . Vitamin D deficiency disease    Past Surgical History:  Procedure Laterality Date  . ABDOMINAL HYSTERECTOMY     Family History  Problem Relation Age of Onset  . Diabetes Maternal Grandmother   . Hypertension Mother   . Cancer Daughter        breast  . Heart disease Son   . Heart disease Son    Social History   Socioeconomic History  . Marital status: Divorced    Spouse name: Not on file  . Number of children: Not on file  . Years of education: Not on file  . Highest education level: GED or equivalent  Occupational History  . Not on file  Social Needs  . Financial resource strain: Somewhat hard  . Food insecurity:    Worry: Sometimes true    Inability: Sometimes true  . Transportation needs:    Medical: No    Non-medical: No  Tobacco Use  . Smoking status: Former Smoker    Packs/day: 0.50    Years: 30.00    Pack years: 15.00    Types:  Cigarettes    Last attempt to quit: 03/31/1977    Years since quitting: 40.5  . Smokeless tobacco: Never Used  Substance and Sexual Activity  . Alcohol use: No    Alcohol/week: 0.0 oz    Comment: rare  . Drug use: No  . Sexual activity: Never  Lifestyle  . Physical activity:    Days per week: 0 days    Minutes per session: 0 min  . Stress: Not at all  Relationships  . Social connections:    Talks on phone: More than three times a week    Gets together: More than three times a week    Attends religious service: More than 4 times per year    Active member of club or organization: No    Attends meetings of clubs or organizations: Never    Relationship status: Divorced  Other Topics Concern  . Not on file  Social History Narrative   . Not on file    Outpatient Encounter Medications as of 10/12/2017  Medication Sig  . acetaminophen (TYLENOL) 500 MG tablet Take 500 mg every 6 (six) hours as needed by mouth. As needed  . amLODipine (NORVASC) 10 MG tablet Take 1 tablet (10 mg total) by mouth daily.  Marland Kitchen atorvastatin (LIPITOR) 20 MG tablet Take 1 tablet (20 mg total) by mouth daily.  . baclofen (LIORESAL) 10 MG tablet Take 1 tablet (10 mg total) by mouth 3 (three) times daily.  . BD INSULIN SYRINGE U/F 31G X 5/16" 0.5 ML MISC USE DAILY AS DIRECTED FOR DIABETES  . Cholecalciferol (VITAMIN D3) 1000 units CAPS Take by mouth. Daily  . Cyanocobalamin (VITAMIN B-12) 5000 MCG SUBL Place under the tongue. Daily  . fluticasone (FLONASE) 50 MCG/ACT nasal spray Place 2 sprays into both nostrils daily.  Marland Kitchen glipiZIDE (GLIPIZIDE XL) 10 MG 24 hr tablet Take 1 tablet (10 mg total) by mouth daily with breakfast.  . hydrochlorothiazide (HYDRODIURIL) 25 MG tablet Take 1 tablet (25 mg total) by mouth daily.  . insulin NPH-regular Human (NOVOLIN 70/30) (70-30) 100 UNIT/ML injection INCREASE AS DIRECTED TO 45 UNITS SUBCUTANEOUSLY EVERY MORNING WITH BREAKFAST AND 30 UNITS BEFORE EVENING MEAL OR AS DIRECTED  . LORazepam (ATIVAN) 1 MG tablet Take 1 tablet (1 mg total) by mouth at bedtime as needed for anxiety.  . magnesium oxide (MAG-OX) 400 MG tablet Take by mouth daily.   . meloxicam (MOBIC) 15 MG tablet Take 1 tablet (15 mg total) by mouth daily.  . montelukast (SINGULAIR) 10 MG tablet Take 1 tablet (10 mg total) by mouth at bedtime.  Marland Kitchen omeprazole (PRILOSEC) 40 MG capsule TAKE ONE (1) CAPSULE EACH DAY  . ramipril (ALTACE) 10 MG capsule Take 1 capsule (10 mg total) by mouth daily.  . vitamin C (ASCORBIC ACID) 500 MG tablet Take 500 mg by mouth daily.  Marland Kitchen warfarin (COUMADIN) 1 MG tablet TAKE ONE TABLET BY MOUTH AT 6 P.M.  . warfarin (COUMADIN) 6 MG tablet Take 1 tablet (6 mg total) by mouth daily.  . diclofenac sodium (VOLTAREN) 1 % GEL Apply 4 g  topically 4 (four) times daily. (Patient not taking: Reported on 10/12/2017)  . warfarin (COUMADIN) 1 MG tablet TAKE ONE TABLET BY MOUTH AT 6 P.M. (Patient not taking: Reported on 10/12/2017)   No facility-administered encounter medications on file as of 10/12/2017.     Activities of Daily Living In your present state of health, do you have any difficulty performing the following activities: 10/12/2017 11/05/2016  Hearing?  N Y  Comment - per pt a little   Vision? Y Y  Comment cataracts  to have cataract removed this month- left eye   Difficulty concentrating or making decisions? N N  Walking or climbing stairs? Y N  Dressing or bathing? N N  Doing errands, shopping? N N  Preparing Food and eating ? N Y  Using the Toilet? N N  In the past six months, have you accidently leaked urine? N N  Do you have problems with loss of bowel control? N N  Managing your Medications? N N  Managing your Finances? N N  Housekeeping or managing your Housekeeping? N N  Some recent data might be hidden    Patient Care Team: Kathrine Haddock, NP as PCP - General (Nurse Practitioner) Christene Lye, MD (General Surgery) Corey Skains, MD as Consulting Physician (Cardiology) Volney American, PA-C as Physician Assistant (Family Medicine)    Assessment:   This is a routine wellness examination for Noraa.  Exercise Activities and Dietary recommendations Current Exercise Habits: Home exercise routine, Type of exercise: walking, Time (Minutes): 30, Frequency (Times/Week): 1, Weekly Exercise (Minutes/Week): 30, Exercise limited by: None identified  Goals    . DIET - INCREASE WATER INTAKE     Recommend drinking at least 6-8 glasses of water a day        Fall Risk Fall Risk  10/12/2017 02/23/2017 01/23/2017 11/05/2016 10/24/2016  Falls in the past year? No No No Yes No  Number falls in past yr: - - - 1 -  Injury with Fall? - - - No -  Follow up - - - - -   Is the patient's home free of loose  throw rugs in walkways, pet beds, electrical cords, etc?   yes      Grab bars in the bathroom? yes      Handrails on the stairs?   no stairs       Adequate lighting?   yes  Timed Get Up and Go performed: Completed in 8 seconds with no use of assistive devices, steady gait. No intervention needed at this time.   Depression Screen PHQ 2/9 Scores 10/12/2017 02/23/2017 01/23/2017 11/05/2016  PHQ - 2 Score 0 2 0 0  PHQ- 9 Score - 8 - -     Cognitive Function     6CIT Screen 10/12/2017 09/05/2016  What Year? 0 points 0 points  What month? 0 points 0 points  What time? 0 points 0 points  Count back from 20 0 points 0 points  Months in reverse 0 points 0 points  Repeat phrase 2 points 0 points  Total Score 2 0    Immunization History  Administered Date(s) Administered  . Influenza, High Dose Seasonal PF 12/25/2015, 01/23/2017  . Influenza,inj,Quad PF,6+ Mos 03/21/2015  . Influenza-Unspecified 12/22/2013    Qualifies for Shingles Vaccine? Yes, discussed shingrix vaccine   Screening Tests Health Maintenance  Topic Date Due  . TETANUS/TDAP  09/15/1953  . PNA vac Low Risk Adult (1 of 2 - PCV13) 09/16/1999  . FOOT EXAM  03/20/2016  . OPHTHALMOLOGY EXAM  09/05/2017  . INFLUENZA VACCINE  10/29/2017  . HEMOGLOBIN A1C  01/14/2018  . DEXA SCAN  Completed    pneumo vaccines completed with previous physician unsure of dates.     Cancer Screenings: Lung: Low Dose CT Chest recommended if Age 11-80 years, 30 pack-year currently smoking OR have quit w/in 15years. Patient does not qualify. Breast:  Up to  date on Mammogram? Not indicated  Up to date of Bone Density/Dexa? Completed 03/31/2010  Colorectal: not indicated   Additional Screenings:  Hepatitis C Screening: not indicated      Plan:    I have personally reviewed and addressed the Medicare Annual Wellness questionnaire and have noted the following in the patient's chart:  A. Medical and social history B. Use of alcohol, tobacco  or illicit drugs  C. Current medications and supplements D. Functional ability and status E.  Nutritional status F.  Physical activity G. Advance directives H. List of other physicians I.  Hospitalizations, surgeries, and ER visits in previous 12 months J.  [place] such as hearing and vision if needed, cognitive and depression L. Referrals and appointments   In addition, I have reviewed and discussed with patient certain preventive protocols, quality metrics, and best practice recommendations. A written personalized care plan for preventive services as well as general preventive health recommendations were provided to patient.   Signed,  Tyler Aas, LPN Nurse Health Advisor   Nurse Notes: due for diabetic foot exam, has office visit on 10/15/2017 with Dr.Johnson, no CPE scheduled currently.     Discussed diabetic eye exam- she will schedule this appointment with Dr.Woodard and have them send results once completed

## 2017-10-15 ENCOUNTER — Ambulatory Visit: Payer: Medicare HMO | Admitting: Family Medicine

## 2017-10-19 ENCOUNTER — Other Ambulatory Visit: Payer: Self-pay | Admitting: Unknown Physician Specialty

## 2017-10-21 NOTE — Telephone Encounter (Signed)
Ativan 1 mg refill request  Has appt on 10/23/17 at 1:15 with Dr. Wynetta Emery  Last refill:  08/21/17   #30  1 refill  [place], [place]

## 2017-10-23 ENCOUNTER — Ambulatory Visit: Payer: Medicare HMO | Admitting: Family Medicine

## 2017-10-26 ENCOUNTER — Ambulatory Visit: Payer: Medicare HMO | Admitting: Family Medicine

## 2017-10-26 ENCOUNTER — Encounter: Payer: Self-pay | Admitting: Family Medicine

## 2017-10-26 VITALS — BP 156/79 | HR 74 | Temp 97.8°F | Ht 63.5 in | Wt 208.2 lb

## 2017-10-26 DIAGNOSIS — E1165 Type 2 diabetes mellitus with hyperglycemia: Secondary | ICD-10-CM

## 2017-10-26 DIAGNOSIS — I482 Chronic atrial fibrillation, unspecified: Secondary | ICD-10-CM

## 2017-10-26 DIAGNOSIS — I480 Paroxysmal atrial fibrillation: Secondary | ICD-10-CM

## 2017-10-26 DIAGNOSIS — R8281 Pyuria: Secondary | ICD-10-CM

## 2017-10-26 DIAGNOSIS — I10 Essential (primary) hypertension: Secondary | ICD-10-CM | POA: Diagnosis not present

## 2017-10-26 DIAGNOSIS — F411 Generalized anxiety disorder: Secondary | ICD-10-CM | POA: Diagnosis not present

## 2017-10-26 DIAGNOSIS — E7800 Pure hypercholesterolemia, unspecified: Secondary | ICD-10-CM | POA: Diagnosis not present

## 2017-10-26 DIAGNOSIS — I2510 Atherosclerotic heart disease of native coronary artery without angina pectoris: Secondary | ICD-10-CM

## 2017-10-26 DIAGNOSIS — I700 Atherosclerosis of aorta: Secondary | ICD-10-CM

## 2017-10-26 DIAGNOSIS — K219 Gastro-esophageal reflux disease without esophagitis: Secondary | ICD-10-CM

## 2017-10-26 DIAGNOSIS — IMO0001 Reserved for inherently not codable concepts without codable children: Secondary | ICD-10-CM

## 2017-10-26 DIAGNOSIS — F322 Major depressive disorder, single episode, severe without psychotic features: Secondary | ICD-10-CM | POA: Diagnosis not present

## 2017-10-26 DIAGNOSIS — N390 Urinary tract infection, site not specified: Secondary | ICD-10-CM | POA: Diagnosis not present

## 2017-10-26 DIAGNOSIS — K760 Fatty (change of) liver, not elsewhere classified: Secondary | ICD-10-CM | POA: Diagnosis not present

## 2017-10-26 LAB — MICROALBUMIN, URINE WAIVED
Creatinine, Urine Waived: 100 mg/dL (ref 10–300)
MICROALB, UR WAIVED: 30 mg/L — AB (ref 0–19)
Microalb/Creat Ratio: <30 mg/g (ref <30)

## 2017-10-26 LAB — COAGUCHEK XS/INR WAIVED
INR: 2.9 — AB (ref 0.9–1.1)
PROTHROMBIN TIME: 35.2 s

## 2017-10-26 LAB — UA/M W/RFLX CULTURE, ROUTINE
BILIRUBIN UA: NEGATIVE
KETONES UA: NEGATIVE
Nitrite, UA: NEGATIVE
Protein, UA: NEGATIVE
RBC UA: NEGATIVE
Specific Gravity, UA: 1.015 (ref 1.005–1.030)
Urobilinogen, Ur: 0.2 mg/dL (ref 0.2–1.0)
pH, UA: 6.5 (ref 5.0–7.5)

## 2017-10-26 LAB — MICROSCOPIC EXAMINATION

## 2017-10-26 LAB — BAYER DCA HB A1C WAIVED: HB A1C (BAYER DCA - WAIVED): 9.1 % — ABNORMAL HIGH (ref <7.0)

## 2017-10-26 MED ORDER — METFORMIN HCL 500 MG PO TABS: 1000.0000 mg | ORAL_TABLET | Freq: Two times a day (BID) | ORAL | 3 refills | Status: DC

## 2017-10-26 MED ORDER — LORAZEPAM 1 MG PO TABS: 1.0000 mg | ORAL_TABLET | Freq: Every evening | ORAL | 0 refills | Status: AC | PRN

## 2017-10-26 MED ORDER — WARFARIN SODIUM 1 MG PO TABS: ORAL_TABLET | ORAL | 1 refills | Status: DC

## 2017-10-26 MED ORDER — HYDROCHLOROTHIAZIDE 25 MG PO TABS: 25.0000 mg | ORAL_TABLET | Freq: Every day | ORAL | 1 refills | Status: DC

## 2017-10-26 MED ORDER — MONTELUKAST SODIUM 10 MG PO TABS: 10.0000 mg | ORAL_TABLET | Freq: Every day | ORAL | 3 refills | Status: DC

## 2017-10-26 MED ORDER — RAMIPRIL 10 MG PO CAPS: 10.0000 mg | ORAL_CAPSULE | Freq: Every day | ORAL | 1 refills | Status: DC

## 2017-10-26 MED ORDER — AMLODIPINE BESYLATE 5 MG PO TABS: 5.0000 mg | ORAL_TABLET | Freq: Every day | ORAL | 3 refills | Status: DC

## 2017-10-26 MED ORDER — WARFARIN SODIUM 6 MG PO TABS: 6.0000 mg | ORAL_TABLET | Freq: Every day | ORAL | 1 refills | Status: DC

## 2017-10-26 MED ORDER — INSULIN NPH ISOPHANE & REGULAR (70-30) 100 UNIT/ML ~~LOC~~ SUSP: SUBCUTANEOUS | 11 refills | Status: DC

## 2017-10-26 MED ORDER — MELOXICAM 15 MG PO TABS: 15.0000 mg | ORAL_TABLET | Freq: Every day | ORAL | 3 refills | Status: DC

## 2017-10-26 MED ORDER — OMEPRAZOLE 40 MG PO CPDR
DELAYED_RELEASE_CAPSULE | ORAL | 3 refills | Status: DC
Start: 2017-10-26 — End: 2018-06-25

## 2017-10-26 NOTE — Assessment & Plan Note (Signed)
Not tolerating atorvastatin. Stop. If cholesterol really high, will start crestor.

## 2017-10-26 NOTE — Progress Notes (Signed)
BP (!) 156/79 (BP Location: Left Arm, Patient Position: Sitting, Cuff Size: Normal)   Pulse 74   Temp 97.8 F (36.6 C)   Ht 5' 3.5" (1.613 m)   Wt 208 lb 4 oz (94.5 kg)   LMP  (LMP Unknown)   SpO2 99%   BMI 36.31 kg/m    Subjective:    Patient ID: Daisy Watkins, female    DOB: [birth year], 84 y.o.   MRN: [id number]  HPI: Daisy Watkins is a 84 y.o. female  Chief Complaint  Patient presents with  . PTINR  . Diabetes    45 units in the morning and 30 units in the evening  . Hypertension  . Hyperlipidemia   Coumadin Management.  The expected duration of coumadin treatment is lifelong The reason for anticoagulation is  A. Fib.  Present Coumadin dose: 7mg  daily Goal: 2.0-3.0  Excessive bruising: no Nose bleeding: no Rectal bleeding: no Prolonged menstrual cycles: N/A Eating diet with consistent amounts of foods containing Vitamin K:yes Any recent antibiotic use? no  HYPERTENSION / HYPERLIPIDEMIA Satisfied with current treatment? no Duration of hypertension: chronic BP monitoring frequency: not checking BP medication side effects: yes- was sluggish on the amlodipne Past BP meds: amlodipine, ramipril, hctz Duration of hyperlipidemia: chronic Cholesterol medication side effects: yes- myalgia, doing better off of it Cholesterol supplements: none Past cholesterol medications: atorvastatin Medication compliance: poor compliance Aspirin: no Recent stressors: no Recurrent headaches: no Visual changes: no Palpitations: no Dyspnea: no Chest pain: no Lower extremity edema: no Dizzy/lightheaded: no  DIABETES Hypoglycemic episodes:no Polydipsia/polyuria: yes- polyuria Visual disturbance: no Chest pain: no Paresthesias: no Glucose Monitoring: yes  Accucheck frequency: Daily  Fasting glucose: 160s in the AM and high after she eats Taking Insulin?: yes Blood Pressure Monitoring: not checking Retinal Examination: has appointment scheduled Foot Exam: Done today Diabetic  Education: Completed Pneumovax: Given today Influenza: Not up to Date Aspirin: no  DEPRESSION Mood status: controlled Satisfied with current treatment?: yes Symptom severity: mild  Duration of current treatment : chronic Side effects: no Medication compliance: excellent compliance Psychotherapy/counseling: no  Depressed mood: yes Anxious mood: yes Anhedonia: no Significant weight loss or gain: no Insomnia: yes hard to fall asleep Fatigue: yes Feelings of worthlessness or guilt: no Impaired concentration/indecisiveness: no Suicidal ideations: no Hopelessness: no Crying spells: no Depression screen [facility] 2/9 10/26/2017 10/26/2017 10/12/2017 02/23/2017 01/23/2017  Decreased Interest 0 0 0 1 0  Down, Depressed, Hopeless 0 0 0 1 0  PHQ - 2 Score 0 0 0 2 0  Altered sleeping 3 3 - 3 -  Tired, decreased energy 3 3 - 2 -  Change in appetite 2 2 - 1 -  Feeling bad or failure about yourself  0 0 - 0 -  Trouble concentrating 0 0 - 0 -  Moving slowly or fidgety/restless 0 0 - 0 -  Suicidal thoughts 0 0 - 0 -  PHQ-9 Score 8 8 - 8 -  Difficult doing work/chores Somewhat difficult Somewhat difficult - - -   GAD 7 : Generalized Anxiety Score 10/26/2017 10/26/2017  Nervous, Anxious, on Edge 0 0  Control/stop worrying 1 1  Worry too much - different things 1 1  Trouble relaxing 1 1  Restless 0 0  Easily annoyed or irritable 0 0  Afraid - awful might happen 0 0  Total GAD 7 Score 3 3  Anxiety Difficulty Somewhat difficult -    Relevant past medical, surgical, family and social history reviewed and  updated as indicated. Interim medical history since our last visit reviewed. Allergies and medications reviewed and updated.  Review of Systems  Constitutional: Negative.   Respiratory: Negative.   Cardiovascular: Negative.   Musculoskeletal: Negative.   Skin: Negative.   Psychiatric/Behavioral: Negative.    Per HPI unless specifically indicated above     Objective:    BP (!) 156/79  (BP Location: Left Arm, Patient Position: Sitting, Cuff Size: Normal)   Pulse 74   Temp 97.8 F (36.6 C)   Ht 5' 3.5" (1.613 m)   Wt 208 lb 4 oz (94.5 kg)   LMP  (LMP Unknown)   SpO2 99%   BMI 36.31 kg/m   Wt Readings from Last 3 Encounters:  10/26/17 208 lb 4 oz (94.5 kg)  10/12/17 210 lb (95.3 kg)  08/14/17 207 lb 9.6 oz (94.2 kg)    Physical Exam  Constitutional: She is oriented to person, place, and time. She appears well-developed and well-nourished. No distress.  HENT:  Head: Normocephalic and atraumatic.  Right Ear: Hearing normal.  Left Ear: Hearing normal.  Nose: Nose normal.  Eyes: Conjunctivae and lids are normal. Right eye exhibits no discharge. Left eye exhibits no discharge. No scleral icterus.  Cardiovascular: Normal rate, regular rhythm, normal heart sounds and intact distal pulses. Exam reveals no gallop and no friction rub.  No murmur heard. Pulmonary/Chest: Effort normal and breath sounds normal. No stridor. No respiratory distress. She has no wheezes. She has no rales. She exhibits no tenderness.  Musculoskeletal: Normal range of motion.  Neurological: She is alert and oriented to person, place, and time.  Skin: Skin is warm, dry and intact. Capillary refill takes less than 2 seconds. No rash noted. She is not diaphoretic. No erythema. No pallor.  Psychiatric: She has a normal mood and affect. Her speech is normal and behavior is normal. Judgment and thought content normal. Cognition and memory are normal.  Nursing note and vitals reviewed.   Results for orders placed or performed in visit on 08/14/17  CoaguChek XS/INR Waived  Result Value Ref Range   INR 1.9 (H) 0.9 - 1.1   Prothrombin Time 22.3 sec  UA/M w/rflx Culture, Routine  Result Value Ref Range   Specific Gravity, UA 1.010 1.005 - 1.030   pH, UA 5.0 5.0 - 7.5   Color, UA Yellow Yellow   Appearance Ur Clear Clear   Leukocytes, UA Negative Negative   Protein, UA Negative Negative/Trace    Glucose, UA 3+ (A) Negative   Ketones, UA Negative Negative   RBC, UA Negative Negative   Bilirubin, UA Negative Negative   Urobilinogen, Ur 1.0 0.2 - 1.0 mg/dL   Nitrite, UA Negative Negative      Assessment & Plan:   Problem List Items Addressed This Visit      Cardiovascular and Mediastinum   Aortic atherosclerosis (HCC) - Primary (Chronic)    Will keep BP and cholesterol and sugars under good control. Call with any concerns.       Relevant Medications   amLODipine (NORVASC) 5 MG tablet   hydrochlorothiazide (HYDRODIURIL) 25 MG tablet   ramipril (ALTACE) 10 MG capsule   warfarin (COUMADIN) 1 MG tablet   warfarin (COUMADIN) 6 MG tablet   Other Relevant Orders   CBC with Differential/Platelet   Comprehensive metabolic panel   TSH   UA/M w/rflx Culture, Routine   Coronary artery disease (Chronic)    Will keep BP and cholesterol and sugars under good control. Call with  any concerns.       Relevant Medications   amLODipine (NORVASC) 5 MG tablet   hydrochlorothiazide (HYDRODIURIL) 25 MG tablet   ramipril (ALTACE) 10 MG capsule   warfarin (COUMADIN) 1 MG tablet   warfarin (COUMADIN) 6 MG tablet   Benign hypertension (Chronic)    Not under great control. Stopped amlodipine as it made her dizzy- will restart it at 5mg  and recheck 1 month. Call with any concerns.       Relevant Medications   amLODipine (NORVASC) 5 MG tablet   hydrochlorothiazide (HYDRODIURIL) 25 MG tablet   ramipril (ALTACE) 10 MG capsule   warfarin (COUMADIN) 1 MG tablet   warfarin (COUMADIN) 6 MG tablet   Other Relevant Orders   CBC with Differential/Platelet   Comprehensive metabolic panel   Microalbumin, Urine Waived   TSH   UA/M w/rflx Culture, Routine   Chronic a-fib (HCC)    INR 2.9- continue 7mg  daily and recheck 33month.       Relevant Medications   amLODipine (NORVASC) 5 MG tablet   hydrochlorothiazide (HYDRODIURIL) 25 MG tablet   ramipril (ALTACE) 10 MG capsule   warfarin (COUMADIN) 1  MG tablet   warfarin (COUMADIN) 6 MG tablet   Other Relevant Orders   CBC with Differential/Platelet   CoaguChek XS/INR Waived   Comprehensive metabolic panel   TSH   UA/M w/rflx Culture, Routine     Digestive   Fatty liver (Chronic)    Rechecking levels today. Await results. Call with any concerns.       Relevant Orders   CBC with Differential/Platelet   Comprehensive metabolic panel   TSH   UA/M w/rflx Culture, Routine   Acid reflux    Under good control on current regimen. Continue current regimen. Continue to monitor. Call with any concerns.      Relevant Medications   omeprazole (PRILOSEC) 40 MG capsule   Other Relevant Orders   CBC with Differential/Platelet   Comprehensive metabolic panel   TSH   UA/M w/rflx Culture, Routine     Endocrine   Diabetes mellitus type 2, uncontrolled, without complications (HCC) (Chronic)    Not under good control with A1c of 9.1- will restart metformin and recheck 1 month for tolerance.       Relevant Medications   insulin NPH-regular Human (NOVOLIN 70/30) (70-30) 100 UNIT/ML injection   ramipril (ALTACE) 10 MG capsule   metFORMIN (GLUCOPHAGE) 500 MG tablet   Other Relevant Orders   Bayer DCA Hb A1c Waived   CBC with Differential/Platelet   Comprehensive metabolic panel   Microalbumin, Urine Waived   TSH   UA/M w/rflx Culture, Routine   Pneumococcal conjugate vaccine 13-valent     Other   Hyperlipidemia (Chronic)    Not tolerating atorvastatin. Stop. If cholesterol really high, will start crestor.       Relevant Medications   amLODipine (NORVASC) 5 MG tablet   hydrochlorothiazide (HYDRODIURIL) 25 MG tablet   ramipril (ALTACE) 10 MG capsule   warfarin (COUMADIN) 1 MG tablet   warfarin (COUMADIN) 6 MG tablet   Other Relevant Orders   CBC with Differential/Platelet   Comprehensive metabolic panel   Lipid Panel w/o Chol/HDL Ratio   TSH   UA/M w/rflx Culture, Routine   Anxiety, generalized    Stable on current regimen.  Continue to monitor. Call with any concerns. Refills of lorazepam given for 2 months.       Relevant Medications   LORazepam (ATIVAN) 1 MG tablet (Start on 11/20/2017)  LORazepam (ATIVAN) 1 MG tablet (Start on 12/20/2017)   Other Relevant Orders   CBC with Differential/Platelet   Comprehensive metabolic panel   TSH   UA/M w/rflx Culture, Routine   Major depression    Stable on current regimen. Continue to monitor. Call with any concerns. Refills of lorazepam given for 2 months.       Relevant Medications   LORazepam (ATIVAN) 1 MG tablet (Start on 11/20/2017)   LORazepam (ATIVAN) 1 MG tablet (Start on 12/20/2017)   Other Relevant Orders   CBC with Differential/Platelet   Comprehensive metabolic panel   TSH   UA/M w/rflx Culture, Routine    Other Visit Diagnoses    Paroxysmal atrial fibrillation (HCC)       Relevant Medications   amLODipine (NORVASC) 5 MG tablet   hydrochlorothiazide (HYDRODIURIL) 25 MG tablet   ramipril (ALTACE) 10 MG capsule   warfarin (COUMADIN) 1 MG tablet   warfarin (COUMADIN) 6 MG tablet   Pyuria       Await culture.    Relevant Orders   Urine Culture       Follow up plan: Return in about 1 month (around 11/23/2017) for INR, follow up BP and DM tolerance, ?cholesterol.

## 2017-10-26 NOTE — Assessment & Plan Note (Addendum)
Not under good control with A1c of 9.1- will restart metformin and recheck 1 month for tolerance.

## 2017-10-26 NOTE — Assessment & Plan Note (Signed)
Rechecking levels today. Await results. Call with any concerns.  

## 2017-10-26 NOTE — Assessment & Plan Note (Signed)
Stable on current regimen. Continue to monitor. Call with any concerns. Refills of lorazepam given for 2 months.

## 2017-10-26 NOTE — Assessment & Plan Note (Signed)
Will keep BP and cholesterol and sugars under good control. Call with any concerns.

## 2017-10-26 NOTE — Assessment & Plan Note (Signed)
Not under great control. Stopped amlodipine as it made her dizzy- will restart it at 5mg  and recheck 1 month. Call with any concerns.

## 2017-10-26 NOTE — Assessment & Plan Note (Signed)
Under good control on current regimen. Continue current regimen. Continue to monitor. Call with any concerns.   

## 2017-10-26 NOTE — Assessment & Plan Note (Signed)
INR 2.9- continue 7mg  daily and recheck 106month.

## 2017-10-27 ENCOUNTER — Encounter: Payer: Self-pay | Admitting: Family Medicine

## 2017-10-27 LAB — COMPREHENSIVE METABOLIC PANEL
ALT: 25 IU/L (ref 0–32)
AST: 22 IU/L (ref 0–40)
Albumin/Globulin Ratio: 1.9 (ref 1.2–2.2)
Albumin: 4.1 g/dL (ref 3.5–4.7)
Alkaline Phosphatase: 114 IU/L (ref 39–117)
BUN/Creatinine Ratio: 14 (ref 12–28)
BUN: 11 mg/dL (ref 8–27)
Bilirubin Total: 0.5 mg/dL (ref 0.0–1.2)
CALCIUM: 9.6 mg/dL (ref 8.7–10.3)
CO2: 25 mmol/L (ref 20–29)
CREATININE: 0.81 mg/dL (ref 0.57–1.00)
Chloride: 97 mmol/L (ref 96–106)
GFR calc Af Amer: 78 mL/min/{1.73_m2} (ref >59)
GFR, EST NON AFRICAN AMERICAN: 67 mL/min/{1.73_m2} (ref >59)
Globulin, Total: 2.2 g/dL (ref 1.5–4.5)
Glucose: 194 mg/dL — ABNORMAL HIGH (ref 65–99)
Potassium: 4.8 mmol/L (ref 3.5–5.2)
Sodium: 138 mmol/L (ref 134–144)
Total Protein: 6.3 g/dL (ref 6.0–8.5)

## 2017-10-27 LAB — CBC WITH DIFFERENTIAL/PLATELET
Basophils Absolute: 0 10*3/uL (ref 0.0–0.2)
Basos: 0 %
EOS (ABSOLUTE): 0.1 10*3/uL (ref 0.0–0.4)
EOS: 1 %
HEMATOCRIT: 41.6 % (ref 34.0–46.6)
Hemoglobin: 13.6 g/dL (ref 11.1–15.9)
IMMATURE GRANULOCYTES: 0 %
Immature Grans (Abs): 0 10*3/uL (ref 0.0–0.1)
Lymphocytes Absolute: 1.9 10*3/uL (ref 0.7–3.1)
Lymphs: 30 %
MCH: 28.6 pg (ref 26.6–33.0)
MCHC: 32.7 g/dL (ref 31.5–35.7)
MCV: 88 fL (ref 79–97)
MONOS ABS: 0.6 10*3/uL (ref 0.1–0.9)
Monocytes: 10 %
NEUTROS PCT: 59 %
Neutrophils Absolute: 3.7 10*3/uL (ref 1.4–7.0)
PLATELETS: 256 10*3/uL (ref 150–450)
RBC: 4.75 x10E6/uL (ref 3.77–5.28)
RDW: 14.5 % (ref 12.3–15.4)
WBC: 6.4 10*3/uL (ref 3.4–10.8)

## 2017-10-27 LAB — LIPID PANEL W/O CHOL/HDL RATIO
Cholesterol, Total: 233 mg/dL — ABNORMAL HIGH (ref 100–199)
HDL: 50 mg/dL (ref >39)
LDL CALC: 127 mg/dL — AB (ref 0–99)
TRIGLYCERIDES: 282 mg/dL — AB (ref 0–149)
VLDL Cholesterol Cal: 56 mg/dL — ABNORMAL HIGH (ref 5–40)

## 2017-10-27 LAB — TSH: TSH: 1.57 u[IU]/mL (ref 0.450–4.500)

## 2017-10-29 ENCOUNTER — Telehealth: Payer: Self-pay | Admitting: Family Medicine

## 2017-10-29 LAB — URINE CULTURE

## 2017-10-29 MED ORDER — NITROFURANTOIN MONOHYD MACRO 100 MG PO CAPS: 100.0000 mg | ORAL_CAPSULE | Freq: Two times a day (BID) | ORAL | 0 refills | Status: DC

## 2017-10-29 NOTE — Telephone Encounter (Signed)
Please let her know that she has a UTI. I've sent an antibiotic to her pharmacy. Thanks!

## 2017-10-29 NOTE — Telephone Encounter (Signed)
Called to notify patient. VM box was full on H number, called work line and left message to have pt return call.

## 2017-10-29 NOTE — Telephone Encounter (Signed)
Pt called and I let her know she has a UTI and medication at the pharmacy. Patient verbalized that she understood and would pick it up

## 2017-11-26 ENCOUNTER — Ambulatory Visit: Payer: Medicare HMO | Admitting: Family Medicine

## 2017-12-15 ENCOUNTER — Encounter: Payer: Self-pay | Admitting: Family Medicine

## 2017-12-15 ENCOUNTER — Ambulatory Visit (INDEPENDENT_AMBULATORY_CARE_PROVIDER_SITE_OTHER): Payer: Medicare HMO | Admitting: Family Medicine

## 2017-12-15 VITALS — BP 128/74 | HR 74 | Temp 98.2°F | Ht 63.5 in | Wt 207.6 lb

## 2017-12-15 DIAGNOSIS — R350 Frequency of micturition: Secondary | ICD-10-CM

## 2017-12-15 DIAGNOSIS — E1165 Type 2 diabetes mellitus with hyperglycemia: Secondary | ICD-10-CM

## 2017-12-15 DIAGNOSIS — IMO0001 Reserved for inherently not codable concepts without codable children: Secondary | ICD-10-CM

## 2017-12-15 DIAGNOSIS — I482 Chronic atrial fibrillation, unspecified: Secondary | ICD-10-CM

## 2017-12-15 DIAGNOSIS — Z23 Encounter for immunization: Secondary | ICD-10-CM | POA: Diagnosis not present

## 2017-12-15 DIAGNOSIS — F5101 Primary insomnia: Secondary | ICD-10-CM

## 2017-12-15 DIAGNOSIS — I10 Essential (primary) hypertension: Secondary | ICD-10-CM | POA: Diagnosis not present

## 2017-12-15 DIAGNOSIS — E7800 Pure hypercholesterolemia, unspecified: Secondary | ICD-10-CM | POA: Diagnosis not present

## 2017-12-15 LAB — UA/M W/RFLX CULTURE, ROUTINE
Bilirubin, UA: NEGATIVE
LEUKOCYTES UA: NEGATIVE
Nitrite, UA: POSITIVE — AB
PROTEIN UA: NEGATIVE
SPEC GRAV UA: 1.02 (ref 1.005–1.030)
Urobilinogen, Ur: 1 mg/dL (ref 0.2–1.0)
pH, UA: 5.5 (ref 5.0–7.5)

## 2017-12-15 LAB — COAGUCHEK XS/INR WAIVED
INR: 2.2 — AB (ref 0.9–1.1)
Prothrombin Time: 26.5 s

## 2017-12-15 LAB — MICROSCOPIC EXAMINATION

## 2017-12-15 MED ORDER — AMLODIPINE BESYLATE 5 MG PO TABS: 5.0000 mg | ORAL_TABLET | Freq: Every day | ORAL | 1 refills | Status: DC

## 2017-12-15 NOTE — Progress Notes (Signed)
BP 128/74 (BP Location: Left Arm, Cuff Size: Normal)   Pulse 74   Temp 98.2 F (36.8 C) (Oral)   Ht 5' 3.5" (1.613 m)   Wt 207 lb 9.6 oz (94.2 kg)   LMP  (LMP Unknown)   SpO2 100%   BMI 36.20 kg/m    Subjective:    Patient ID: Daisy Watkins, female    DOB: [birth year], 84 y.o.   MRN: [id number]  HPI: Daisy Watkins is a 84 y.o. female  Chief Complaint  Patient presents with  . Diabetes    pt states she is going to make an appointment with Dr. Ellin Mayhew today for eye exam  . Atrial Fibrillation    pt states she is taking 7 mg of warfarin daily  . Urinary Tract Infection    pt states she has been having urinary frequency    DIABETES- last A1c was 9.1 10/26/17- restarted on metformin last visit. Doing well. No side effects Hypoglycemic episodes:no Polydipsia/polyuria: no Visual disturbance: no Chest pain: no Paresthesias: no Glucose Monitoring: no Taking Insulin?: yes Blood Pressure Monitoring: not checking Retinal Examination: Not up to Date Foot Exam: Up to Date Diabetic Education: Completed Pneumovax: Refused Influenza: Refused Aspirin: yes  Coumadin Management.  The expected duration of coumadin treatment is lifelong The reason for anticoagulation is  A. Fib.  Present Coumadin dose: 7 mg daily Goal: 2.0-3.0  Excessive bruising: no Nose bleeding: no Rectal bleeding: no Prolonged menstrual cycles: N/A Eating diet with consistent amounts of foods containing Vitamin K:yes Any recent antibiotic use? no  HYPERTENSION / HYPERLIPIDEMIA- restarted on amlodipine last visit due to dizziness. Was not tolerating her atorvastatin- stopped last month, rechecking today. Satisfied with current treatment? yes Duration of hypertension: chronic BP monitoring frequency: not checking BP medication side effects: no Past BP meds: amlodipine Duration of hyperlipidemia: chronic Cholesterol medication side effects: yes Cholesterol supplements: none Past cholesterol medications:  atorvastain (lipitor) Medication compliance: excellent compliance Aspirin: yes Recent stressors: no Recurrent headaches: no Visual changes: no Palpitations: no Dyspnea: no Chest pain: no Lower extremity edema: no Dizzy/lightheaded: no  URINARY SYMPTOMS Duration: 1 week Dysuria: no Urinary frequency: yes Urgency: yes Small volume voids: no Symptom severity: moderate Urinary incontinence: no Foul odor: yes Hematuria: no Abdominal pain: no Back pain: yes Suprapubic pain/pressure: yes Flank pain: yes Fever:  no Vomiting: no Relief with cranberry juice: no Relief with pyridium: no Status: stable Previous urinary tract infection: yes Recurrent urinary tract infection: no Treatments attempted: increasing fluids    Relevant past medical, surgical, family and social history reviewed and updated as indicated. Interim medical history since our last visit reviewed. Allergies and medications reviewed and updated.  Review of Systems  Constitutional: Negative.   Respiratory: Negative.   Cardiovascular: Negative.   Skin: Negative.   Neurological: Negative.   Psychiatric/Behavioral: Positive for sleep disturbance. Negative for agitation, behavioral problems, confusion, decreased concentration, dysphoric mood, hallucinations, self-injury and suicidal ideas. The patient is nervous/anxious. The patient is not hyperactive.     Per HPI unless specifically indicated above     Objective:    BP 128/74 (BP Location: Left Arm, Cuff Size: Normal)   Pulse 74   Temp 98.2 F (36.8 C) (Oral)   Ht 5' 3.5" (1.613 m)   Wt 207 lb 9.6 oz (94.2 kg)   LMP  (LMP Unknown)   SpO2 100%   BMI 36.20 kg/m   Wt Readings from Last 3 Encounters:  12/15/17 207 lb 9.6 oz (94.2 kg)  10/26/17 208 lb 4 oz (94.5 kg)  10/12/17 210 lb (95.3 kg)    Physical Exam  Constitutional: She is oriented to person, place, and time. She appears well-developed and well-nourished. No distress.  HENT:  Head:  Normocephalic and atraumatic.  Right Ear: Hearing normal.  Left Ear: Hearing normal.  Nose: Nose normal.  Eyes: Conjunctivae and lids are normal. Right eye exhibits no discharge. Left eye exhibits no discharge. No scleral icterus.  Cardiovascular: Normal rate, regular rhythm, normal heart sounds and intact distal pulses. Exam reveals no gallop and no friction rub.  No murmur heard. Pulmonary/Chest: Effort normal and breath sounds normal. No stridor. No respiratory distress. She has no wheezes. She has no rales. She exhibits no tenderness.  Musculoskeletal: Normal range of motion.  Neurological: She is alert and oriented to person, place, and time.  Skin: Skin is warm, dry and intact. Capillary refill takes less than 2 seconds. No rash noted. She is not diaphoretic. No erythema. No pallor.  Psychiatric: She has a normal mood and affect. Her speech is normal and behavior is normal. Judgment and thought content normal. Cognition and memory are normal.    Results for orders placed or performed in visit on 10/26/17  Urine Culture  Result Value Ref Range   Urine Culture, Routine Final report (A)    Organism ID, Bacteria Enterococcus faecalis (A)    Antimicrobial Susceptibility Comment   Microscopic Examination  Result Value Ref Range   WBC, UA 0-5 0 - 5 /hpf   RBC, UA 0-2 0 - 2 /hpf   Epithelial Cells (non renal) 0-10 0 - 10 /hpf   Renal Epithel, UA 0-10 (A) None seen /hpf   Bacteria, UA Moderate (A) None seen/Few  Bayer DCA Hb A1c Waived  Result Value Ref Range   HB A1C (BAYER DCA - WAIVED) 9.1 (H) <7.0 %  CBC with Differential/Platelet  Result Value Ref Range   WBC 6.4 3.4 - 10.8 x10E3/uL   RBC 4.75 3.77 - 5.28 x10E6/uL   Hemoglobin 13.6 11.1 - 15.9 g/dL   Hematocrit 41.6 34.0 - 46.6 %   MCV 88 79 - 97 fL   MCH 28.6 26.6 - 33.0 pg   MCHC 32.7 31.5 - 35.7 g/dL   RDW 14.5 12.3 - 15.4 %   Platelets 256 150 - 450 x10E3/uL   Neutrophils 59 Not Estab. %   Lymphs 30 Not Estab. %    Monocytes 10 Not Estab. %   Eos 1 Not Estab. %   Basos 0 Not Estab. %   Neutrophils Absolute 3.7 1.4 - 7.0 x10E3/uL   Lymphocytes Absolute 1.9 0.7 - 3.1 x10E3/uL   Monocytes Absolute 0.6 0.1 - 0.9 x10E3/uL   EOS (ABSOLUTE) 0.1 0.0 - 0.4 x10E3/uL   Basophils Absolute 0.0 0.0 - 0.2 x10E3/uL   Immature Granulocytes 0 Not Estab. %   Immature Grans (Abs) 0.0 0.0 - 0.1 x10E3/uL  CoaguChek XS/INR Waived  Result Value Ref Range   INR 2.9 (H) 0.9 - 1.1   Prothrombin Time 35.2 sec  Comprehensive metabolic panel  Result Value Ref Range   Glucose 194 (H) 65 - 99 mg/dL   BUN 11 8 - 27 mg/dL   Creatinine, Ser 0.81 0.57 - 1.00 mg/dL   GFR calc non Af Amer 67 >59 mL/min/1.73   GFR calc Af Amer 78 >59 mL/min/1.73   BUN/Creatinine Ratio 14 12 - 28   Sodium 138 134 - 144 mmol/L   Potassium 4.8 3.5 - 5.2  mmol/L   Chloride 97 96 - 106 mmol/L   CO2 25 20 - 29 mmol/L   Calcium 9.6 8.7 - 10.3 mg/dL   Total Protein 6.3 6.0 - 8.5 g/dL   Albumin 4.1 3.5 - 4.7 g/dL   Globulin, Total 2.2 1.5 - 4.5 g/dL   Albumin/Globulin Ratio 1.9 1.2 - 2.2   Bilirubin Total 0.5 0.0 - 1.2 mg/dL   Alkaline Phosphatase 114 39 - 117 IU/L   AST 22 0 - 40 IU/L   ALT 25 0 - 32 IU/L  Lipid Panel w/o Chol/HDL Ratio  Result Value Ref Range   Cholesterol, Total 233 (H) 100 - 199 mg/dL   Triglycerides 282 (H) 0 - 149 mg/dL   HDL 50 >39 mg/dL   VLDL Cholesterol Cal 56 (H) 5 - 40 mg/dL   LDL Calculated 127 (H) 0 - 99 mg/dL  Microalbumin, Urine Waived  Result Value Ref Range   Microalb, Ur Waived 30 (H) 0 - 19 mg/L   Creatinine, Urine Waived 100 10 - 300 mg/dL   Microalb/Creat Ratio <30 <30 mg/g  TSH  Result Value Ref Range   TSH 1.570 0.450 - 4.500 uIU/mL  UA/M w/rflx Culture, Routine  Result Value Ref Range   Specific Gravity, UA 1.015 1.005 - 1.030   pH, UA 6.5 5.0 - 7.5   Color, UA Yellow Yellow   Appearance Ur Cloudy (A) Clear   Leukocytes, UA Trace (A) Negative   Protein, UA Negative Negative/Trace   Glucose, UA  2+ (A) Negative   Ketones, UA Negative Negative   RBC, UA Negative Negative   Bilirubin, UA Negative Negative   Urobilinogen, Ur 0.2 0.2 - 1.0 mg/dL   Nitrite, UA Negative Negative   Microscopic Examination See below:       Assessment & Plan:   Problem List Items Addressed This Visit      Cardiovascular and Mediastinum   Benign hypertension (Chronic)    Better on recheck. Continue current regimen. Continue to monitor. Call with any concerns.       Relevant Medications   amLODipine (NORVASC) 5 MG tablet   Other Relevant Orders   Comprehensive metabolic panel   Chronic a-fib (HCC) - Primary    Stable with INR of 2.2. Continue current regimen. Continue to monitor. Recheck 1 month.       Relevant Medications   amLODipine (NORVASC) 5 MG tablet   Other Relevant Orders   CoaguChek XS/INR Waived   Comprehensive metabolic panel     Endocrine   Diabetes mellitus type 2, uncontrolled, without complications (HCC) (Chronic)    Tolerating her metformin well. Rechecking CMP today. If kidney function doing well, will increase to 1 tab BID. Follow up 1 month.       Relevant Orders   Comprehensive metabolic panel     Other   Hyperlipidemia (Chronic)    Off statin due to side effects. Rechecking today. If elevated, will start crestor. Call with any concerns.       Relevant Medications   amLODipine (NORVASC) 5 MG tablet   Other Relevant Orders   Lipid Panel w/o Chol/HDL Ratio   Comprehensive metabolic panel   Insomnia    Stable on current regimen. Continue current regimen. Refill of lorazepam at pharmacy for 1 more month. Follow up 1 month.        Other Visit Diagnoses    Need for influenza vaccination       Flu shot given today.   Relevant Orders  Flu vaccine HIGH DOSE PF (Completed)   Urinary frequency       + nitrites, but 3+ glucose- likely the cause of the frequency. Will await culture. Call with any concerns.    Relevant Orders   UA/M w/rflx Culture, Routine   Urine  Culture       Follow up plan: Return 4-6 weeks, for INR/DM follow up, Insomnia/Depression follow up with Jolene.

## 2017-12-15 NOTE — Assessment & Plan Note (Addendum)
Stable with INR of 2.2. Continue current regimen. Continue to monitor. Recheck 1 month.

## 2017-12-15 NOTE — Assessment & Plan Note (Signed)
Tolerating her metformin well. Rechecking CMP today. If kidney function doing well, will increase to 1 tab BID. Follow up 1 month.

## 2017-12-15 NOTE — Assessment & Plan Note (Addendum)
Stable on current regimen. Continue current regimen. Refill of lorazepam at pharmacy for 1 more month. Follow up 1 month.

## 2017-12-15 NOTE — Assessment & Plan Note (Signed)
Off statin due to side effects. Rechecking today. If elevated, will start crestor. Call with any concerns.

## 2017-12-15 NOTE — Assessment & Plan Note (Signed)
Better on recheck. Continue current regimen. Continue to monitor. Call with any concerns.  

## 2017-12-16 LAB — COMPREHENSIVE METABOLIC PANEL
A/G RATIO: 2 (ref 1.2–2.2)
ALT: 24 IU/L (ref 0–32)
AST: 19 IU/L (ref 0–40)
Albumin: 4 g/dL (ref 3.5–4.7)
Alkaline Phosphatase: 106 IU/L (ref 39–117)
BILIRUBIN TOTAL: 0.6 mg/dL (ref 0.0–1.2)
BUN / CREAT RATIO: 17 (ref 12–28)
BUN: 15 mg/dL (ref 8–27)
CHLORIDE: 99 mmol/L (ref 96–106)
CO2: 24 mmol/L (ref 20–29)
Calcium: 9.7 mg/dL (ref 8.7–10.3)
Creatinine, Ser: 0.86 mg/dL (ref 0.57–1.00)
GFR calc non Af Amer: 63 mL/min/{1.73_m2} (ref >59)
GFR, EST AFRICAN AMERICAN: 72 mL/min/{1.73_m2} (ref >59)
GLOBULIN, TOTAL: 2 g/dL (ref 1.5–4.5)
Glucose: 318 mg/dL — ABNORMAL HIGH (ref 65–99)
POTASSIUM: 4.5 mmol/L (ref 3.5–5.2)
SODIUM: 136 mmol/L (ref 134–144)
TOTAL PROTEIN: 6 g/dL (ref 6.0–8.5)

## 2017-12-16 LAB — LIPID PANEL W/O CHOL/HDL RATIO
CHOLESTEROL TOTAL: 241 mg/dL — AB (ref 100–199)
HDL: 53 mg/dL (ref >39)
LDL Calculated: 144 mg/dL — ABNORMAL HIGH (ref 0–99)
TRIGLYCERIDES: 218 mg/dL — AB (ref 0–149)
VLDL Cholesterol Cal: 44 mg/dL — ABNORMAL HIGH (ref 5–40)

## 2017-12-18 ENCOUNTER — Telehealth: Payer: Self-pay | Admitting: Family Medicine

## 2017-12-18 ENCOUNTER — Other Ambulatory Visit: Payer: Self-pay | Admitting: Family Medicine

## 2017-12-18 LAB — URINE CULTURE

## 2017-12-18 MED ORDER — NITROFURANTOIN MONOHYD MACRO 100 MG PO CAPS: 100.0000 mg | ORAL_CAPSULE | Freq: Two times a day (BID) | ORAL | 0 refills | Status: DC

## 2017-12-18 NOTE — Telephone Encounter (Signed)
Please let her know that she does have a UTI, so i've sent an antibiotic to her pharmacy. Thanks!

## 2017-12-18 NOTE — Telephone Encounter (Signed)
Spoke with patient.

## 2017-12-18 NOTE — Telephone Encounter (Signed)
Lorazepam refill Last refill 10/26/17  #30  0 Refills PCP Megan Johnson DO LOV 12/15/17 Pharmacy [place] with this pharmacy  Last RX  10/26/17 They did not receive last reorder sent by [facility] at last office visit 12/15/17 for 2nd refill to start 12/20/17

## 2017-12-18 NOTE — Telephone Encounter (Signed)
Message relayed to patient. Verbalized understanding and denied questions.   

## 2017-12-18 NOTE — Telephone Encounter (Signed)
Called pt advised prescription should be ready 12/20/2017

## 2017-12-18 NOTE — Telephone Encounter (Signed)
Left message on machine for pt to return call to the office.  

## 2017-12-18 NOTE — Telephone Encounter (Signed)
Copied from [place] [phone]. Topic: Quick Communication - See Telephone Encounter >> Dec 18, 2017 10:59 AM Blase Mess A wrote: CRM for notification. See Telephone encounter for: 12/18/17. Patient is returnind Jada's call.  She will wait for her call [phone] (M)

## 2017-12-21 ENCOUNTER — Other Ambulatory Visit: Payer: Self-pay | Admitting: Family Medicine

## 2018-01-20 ENCOUNTER — Other Ambulatory Visit: Payer: Self-pay

## 2018-01-20 ENCOUNTER — Encounter: Payer: Self-pay | Admitting: Nurse Practitioner

## 2018-01-20 ENCOUNTER — Ambulatory Visit: Payer: Medicare HMO | Admitting: Nurse Practitioner

## 2018-01-20 VITALS — BP 138/78 | HR 57 | Temp 97.9°F | Ht 63.5 in | Wt 206.0 lb

## 2018-01-20 DIAGNOSIS — I700 Atherosclerosis of aorta: Secondary | ICD-10-CM | POA: Diagnosis not present

## 2018-01-20 DIAGNOSIS — F5101 Primary insomnia: Secondary | ICD-10-CM

## 2018-01-20 DIAGNOSIS — IMO0001 Reserved for inherently not codable concepts without codable children: Secondary | ICD-10-CM

## 2018-01-20 DIAGNOSIS — F322 Major depressive disorder, single episode, severe without psychotic features: Secondary | ICD-10-CM

## 2018-01-20 DIAGNOSIS — I482 Chronic atrial fibrillation, unspecified: Secondary | ICD-10-CM

## 2018-01-20 DIAGNOSIS — E1165 Type 2 diabetes mellitus with hyperglycemia: Secondary | ICD-10-CM | POA: Diagnosis not present

## 2018-01-20 DIAGNOSIS — H2513 Age-related nuclear cataract, bilateral: Secondary | ICD-10-CM | POA: Diagnosis not present

## 2018-01-20 DIAGNOSIS — E119 Type 2 diabetes mellitus without complications: Secondary | ICD-10-CM | POA: Diagnosis not present

## 2018-01-20 LAB — COAGUCHEK XS/INR WAIVED
INR: 2.7 — AB (ref 0.9–1.1)
Prothrombin Time: 32.6 s

## 2018-01-20 MED ORDER — LORAZEPAM 1 MG PO TABS: 1.0000 mg | ORAL_TABLET | ORAL | 1 refills | Status: DC | PRN

## 2018-01-20 NOTE — Assessment & Plan Note (Signed)
Chronic, ongoing.  Previous INR 2.2 and today 2.7.  Is currently taking 7MG  Coumadin daily, will continue this and recheck in one month.

## 2018-01-20 NOTE — Assessment & Plan Note (Signed)
Chronic, ongoing.  Will obtain A1C at next visit, as not obtained with INR today.  GFR 72 and CRT 0.86 in September 2019, if A1C elevated at next visit and tolerating Metformin then increase to one tablet BID.

## 2018-01-20 NOTE — Patient Instructions (Signed)
Lorazepam tablets What is this medicine? LORAZEPAM (lor A ze pam) is a benzodiazepine. It is used to treat anxiety. This medicine may be used for other purposes; ask your health care provider or pharmacist if you have questions. COMMON BRAND NAME(S): Ativan What should I tell my health care provider before I take this medicine? They need to know if you have any of these conditions: -glaucoma -history of drug or alcohol abuse problem -kidney disease -liver disease -lung or breathing disease, like asthma -mental illness -myasthenia gravis -Parkinson's disease -suicidal thoughts, plans, or attempt; a previous suicide attempt by you or a family member -an unusual or allergic reaction to lorazepam, other medicines, foods, dyes, or preservatives -pregnant or trying to get pregnant -breast-feeding How should I use this medicine? Take this medicine by mouth with a glass of water. Follow the directions on the prescription label. Take your medicine at regular intervals. Do not take it more often than directed. Do not stop taking except on your doctor's advice. A special MedGuide will be given to you by the pharmacist with each prescription and refill. Be sure to read this information carefully each time. Talk to your pediatrician regarding the use of this medicine in children. While this drug may be used in children as young as 12 years for selected conditions, precautions do apply. Overdosage: If you think you have taken too much of this medicine contact a poison control center or emergency room at once. NOTE: This medicine is only for you. Do not share this medicine with others. What if I miss a dose? If you miss a dose, take it as soon as you can. If it is almost time for your next dose, take only that dose. Do not take double or extra doses. What may interact with this medicine? Do not take this medicine with any of the following medications: -narcotic medicines for cough -sodium  oxybate This medicine may also interact with the following medications: -alcohol -antihistamines for allergy, cough and cold -certain medicines for anxiety or sleep -certain medicines for depression, like amitriptyline, fluoxetine, sertraline -certain medicines for seizures like carbamazepine, phenobarbital, phenytoin, primidone -general anesthetics like lidocaine, pramoxine, tetracaine -MAOIs like Carbex, Eldepryl, Marplan, Nardil, and Parnate -medicines that relax muscles for surgery -narcotic medicines for pain -phenothiazines like chlorpromazine, mesoridazine, prochlorperazine, thioridazine This list may not describe all possible interactions. Give your health care provider a list of all the medicines, herbs, non-prescription drugs, or dietary supplements you use. Also tell them if you smoke, drink alcohol, or use illegal drugs. Some items may interact with your medicine. What should I watch for while using this medicine? Tell your doctor or health care professional if your symptoms do not start to get better or if they get worse. Do not stop taking except on your doctor's advice. You may develop a severe reaction. Your doctor will tell you how much medicine to take. You may get drowsy or dizzy. Do not drive, use machinery, or do anything that needs mental alertness until you know how this medicine affects you. To reduce the risk of dizzy and fainting spells, do not stand or sit up quickly, especially if you are an older patient. Alcohol may increase dizziness and drowsiness. Avoid alcoholic drinks. If you are taking another medicine that also causes drowsiness, you may have more side effects. Give your health care provider a list of all medicines you use. Your doctor will tell you how much medicine to take. Do not take more medicine than directed. Call  emergency for help if you have problems breathing or unusual sleepiness. What side effects may I notice from receiving this medicine? Side  effects that you should report to your doctor or health care professional as soon as possible: -allergic reactions like skin rash, itching or hives, swelling of the face, lips, or tongue -breathing problems -confusion -loss of balance or coordination -signs and symptoms of low blood pressure like dizziness; feeling faint or lightheaded, falls; unusually weak or tired -suicidal thoughts or other mood changes Side effects that usually do not require medical attention (report to your doctor or health care professional if they continue or are bothersome): -dizziness -headache -nausea, vomiting -tiredness This list may not describe all possible side effects. Call your doctor for medical advice about side effects. You may report side effects to FDA at 1-800-FDA-1088. Where should I keep my medicine? Keep out of the reach of children. This medicine can be abused. Keep your medicine in a safe place to protect it from theft. Do not share this medicine with anyone. Selling or giving away this medicine is dangerous and against the law. This medicine may cause accidental overdose and death if taken by other adults, children, or pets. Mix any unused medicine with a substance like cat litter or coffee grounds. Then throw the medicine away in a sealed container like a sealed bag or a coffee can with a lid. Do not use the medicine after the expiration date. Store at room temperature between 20 and 25 degrees C (68 and 77 degrees F). Protect from light. Keep container tightly closed. NOTE: This sheet is a summary. It may not cover all possible information. If you have questions about this medicine, talk to your doctor, pharmacist, or health care provider.  2018 Elsevier/Gold Standard (2014-12-14 15:54:27)

## 2018-01-20 NOTE — Assessment & Plan Note (Signed)
Chronic, stable on current med regimen.  Continue Lorazepam and follow-up visits every three months for refills.  Pt is aware of risks of benzo medication use to include increased sedation, respiratory suppression, falls, and  dependence.  Pt would like to continue treatment as benefit determined to outweigh risk.

## 2018-01-20 NOTE — Progress Notes (Signed)
BP 138/78   Pulse (!) 57   Temp 97.9 F (36.6 C) (Oral)   Ht 5' 3.5" (1.613 m)   Wt 206 lb (93.4 kg)   LMP  (LMP Unknown)   SpO2 97%   BMI 35.92 kg/m    Subjective:    Patient ID: Daisy Watkins, female    DOB: 11/10/1932, 84 y.o.   MRN: [id number]  HPI: Daisy Watkins is a 84 y.o. female presents for f/u visit for INR, T2DM, depression, and Insomnia  Chief Complaint  Patient presents with  . Coagulation Disorder    4 w f/u  . Diabetes  . Depression  . Insomnia    ATRIAL FIBRILLATION Continues on chronic Coumadin use. Atrial fibrillation status: controlled Satisfied with current treatment: yes  Medication side effects:  no Medication compliance: excellent compliance Etiology of atrial fibrillation:  Palpitations:  no Chest pain:  no Dyspnea on exertion:  no Orthopnea:  no Syncope:  no Edema:  no Ventricular rate control: Ramipril Anti-coagulation: warfarin   DIABETES Hypoglycemic episodes:no Polydipsia/polyuria: no Visual disturbance: no Chest pain: no Paresthesias: no Glucose Monitoring: yes  Accucheck frequency: Daily  Fasting glucose: 130-150 range  Post prandial:  Evening:  Before meals: Taking Insulin?: yes  Long acting insulin:  Short acting insulin: NPH 70/30 Blood Pressure Monitoring: not checking Retinal Examination: Up to Date Foot Exam: Up to Date Diabetic Education: Completed Pneumovax: Up to Date Influenza: Up to Date Aspirin: no   Coumadin Management.  The expected duration of coumadin treatment is lifelong The reason for anticoagulation is  A. Fib.  Present Coumadin dose: Goal: 2.0-3.0 The patient does not have an INR goal. Excessive bruising: no Nose bleeding: no Rectal bleeding: no Prolonged menstrual cycles: N/A Eating diet with consistent amounts of foods containing Vitamin K:no Any recent antibiotic use? no   INSOMNIA/DEPRESSION: Reports she takes Lorazepam 1MG  at night before bed when she needs it, takes it about 2-3  times a week.  Has been on this long term on review of records and last filled in July 2019.  Does not wish to try maintenance medication, such as Sertraline or Buspar, at this time.  Reports the Lorazepam has "always worked for me".  Denies any recent falls or injuries.  Reports her mood is at her baseline, not worse or better.  States she sometimes has trouble falling asleep, which Lorazepam helps, and that a majority of her mood issues are due to poor sleep.  Relevant past medical, surgical, family and social history reviewed and updated as indicated. Interim medical history since our last visit reviewed. Allergies and medications reviewed and updated.  Review of Systems  Constitutional: Negative for activity change, appetite change, fatigue and fever.  Respiratory: Negative for cough, chest tightness and shortness of breath.   Cardiovascular: Negative for chest pain, palpitations and leg swelling.  Gastrointestinal: Negative for abdominal distention, abdominal pain, constipation, diarrhea, nausea and vomiting.  Endocrine: Negative for cold intolerance, heat intolerance, polydipsia, polyphagia and polyuria.  Neurological: Negative for dizziness, tremors, weakness, numbness and headaches.  Psychiatric/Behavioral: Negative for behavioral problems, confusion and decreased concentration. The patient is not nervous/anxious.        Difficulty falling asleep, but sleeps well once asleep     Per HPI unless specifically indicated above     Objective:    BP 138/78   Pulse (!) 57   Temp 97.9 F (36.6 C) (Oral)   Ht 5' 3.5" (1.613 m)   Wt 206 lb (93.4  kg)   LMP  (LMP Unknown)   SpO2 97%   BMI 35.92 kg/m   Wt Readings from Last 3 Encounters:  01/20/18 206 lb (93.4 kg)  12/15/17 207 lb 9.6 oz (94.2 kg)  10/26/17 208 lb 4 oz (94.5 kg)    Physical Exam  Constitutional: She is oriented to person, place, and time. She appears well-developed and well-nourished.  HENT:  Head: Normocephalic and  atraumatic.  Eyes: Pupils are equal, round, and reactive to light. EOM are normal.  Neck: Normal range of motion. Neck supple.  Cardiovascular: Normal rate, regular rhythm and normal heart sounds.  Pulmonary/Chest: Effort normal and breath sounds normal.  Abdominal: Soft. Bowel sounds are normal.  Neurological: She is alert and oriented to person, place, and time.  Skin: Skin is warm and dry.  Psychiatric: She has a normal mood and affect. Her behavior is normal. Judgment and thought content normal.    Results for orders placed or performed in visit on 12/15/17  Urine Culture  Result Value Ref Range   Urine Culture, Routine Final report (A)    Organism ID, Bacteria Proteus mirabilis (A)    ORGANISM ID, BACTERIA Comment (A)    Antimicrobial Susceptibility Comment   Microscopic Examination  Result Value Ref Range   WBC, UA 0-5 0 - 5 /hpf   RBC, UA 0-2 0 - 2 /hpf   Epithelial Cells (non renal) 0-10 0 - 10 /hpf   Renal Epithel, UA 0-10 (A) None seen /hpf   Bacteria, UA Many (A) None seen/Few  CoaguChek XS/INR Waived  Result Value Ref Range   INR 2.2 (H) 0.9 - 1.1   Prothrombin Time 26.5 sec  Lipid Panel w/o Chol/HDL Ratio  Result Value Ref Range   Cholesterol, Total 241 (H) 100 - 199 mg/dL   Triglycerides 218 (H) 0 - 149 mg/dL   HDL 53 >39 mg/dL   VLDL Cholesterol Cal 44 (H) 5 - 40 mg/dL   LDL Calculated 144 (H) 0 - 99 mg/dL  Comprehensive metabolic panel  Result Value Ref Range   Glucose 318 (H) 65 - 99 mg/dL   BUN 15 8 - 27 mg/dL   Creatinine, Ser 0.86 0.57 - 1.00 mg/dL   GFR calc non Af Amer 63 >59 mL/min/1.73   GFR calc Af Amer 72 >59 mL/min/1.73   BUN/Creatinine Ratio 17 12 - 28   Sodium 136 134 - 144 mmol/L   Potassium 4.5 3.5 - 5.2 mmol/L   Chloride 99 96 - 106 mmol/L   CO2 24 20 - 29 mmol/L   Calcium 9.7 8.7 - 10.3 mg/dL   Total Protein 6.0 6.0 - 8.5 g/dL   Albumin 4.0 3.5 - 4.7 g/dL   Globulin, Total 2.0 1.5 - 4.5 g/dL   Albumin/Globulin Ratio 2.0 1.2 - 2.2    Bilirubin Total 0.6 0.0 - 1.2 mg/dL   Alkaline Phosphatase 106 39 - 117 IU/L   AST 19 0 - 40 IU/L   ALT 24 0 - 32 IU/L  UA/M w/rflx Culture, Routine  Result Value Ref Range   Specific Gravity, UA 1.020 1.005 - 1.030   pH, UA 5.5 5.0 - 7.5   Color, UA Yellow Yellow   Appearance Ur Turbid (A) Clear   Leukocytes, UA Negative Negative   Protein, UA Negative Negative/Trace   Glucose, UA 3+ (A) Negative   Ketones, UA Trace (A) Negative   RBC, UA Trace (A) Negative   Bilirubin, UA Negative Negative   Urobilinogen, Ur 1.0  0.2 - 1.0 mg/dL   Nitrite, UA Positive (A) Negative   Microscopic Examination See below:       Assessment & Plan:   Problem List Items Addressed This Visit      Cardiovascular and Mediastinum   Aortic atherosclerosis ([place]) - Primary (Chronic)   Relevant Orders   CoaguChek XS/INR Waived   Chronic a-fib    Chronic, ongoing.  Previous INR 2.2 and today 2.7.  Is currently taking 7MG  Coumadin daily, will continue this and recheck in one month.        Endocrine   Diabetes mellitus type 2, uncontrolled, without complications (HCC) (Chronic)    Chronic, ongoing.  Will obtain A1C at next visit, as not obtained with INR today.  GFR 72 and CRT 0.86 in September 2019, if A1C elevated at next visit and tolerating Metformin then increase to one tablet BID.        Other   Major depression    Chronic, stable on current med regimen.  Continue Lorazepam and follow-up visits every three months for refills.  Pt is aware of risks of benzo medication use to include increased sedation, respiratory suppression, falls, and  dependence.  Pt would like to continue treatment as benefit determined to outweigh risk.        Relevant Medications   LORazepam (ATIVAN) 1 MG tablet   Insomnia    Chronic, stable on current regimen.  Continue Lorazepam and continue three month follow-up for refills.          Follow up plan: Return in about 1 month (around 02/20/2018).

## 2018-01-20 NOTE — Assessment & Plan Note (Signed)
Chronic, stable on current regimen.  Continue Lorazepam and continue three month follow-up for refills.

## 2018-01-27 ENCOUNTER — Other Ambulatory Visit: Payer: Self-pay | Admitting: Unknown Physician Specialty

## 2018-01-27 ENCOUNTER — Other Ambulatory Visit: Payer: Self-pay | Admitting: Family Medicine

## 2018-01-27 DIAGNOSIS — I480 Paroxysmal atrial fibrillation: Secondary | ICD-10-CM

## 2018-01-27 DIAGNOSIS — I10 Essential (primary) hypertension: Secondary | ICD-10-CM

## 2018-01-27 NOTE — Telephone Encounter (Signed)
Requested medication (s) are due for refill today -yes  Requested medication (s) are on the active medication list -yes  Future visit scheduled -yes  Last refill: medication noted discontinued- but at appointment- dose listed as 7mg  and noted to continue. If this is the case- patient will need this medication.  Notes to clinic: see above  Requested Prescriptions  Pending Prescriptions Disp Refills   warfarin (COUMADIN) 1 MG tablet [Pharmacy Med Name: WARFARIN SODIUM 1 MG TABLET] 30 tablet 1    Sig: TAKE ONE TABLET BY MOUTH AT 6 P.M.     Hematology:  Anticoagulants - warfarin Failed - 01/27/2018  4:09 PM      Failed - If the patient is managed by Coumadin Clinic - route to their Pool. If not, forward to the provider.      Failed - INR in normal range and within 30 days    INR  Date Value Ref Range Status  01/20/2018 2.7 (H) 0.9 - 1.1 Final  01/27/2014 1.4  Final    Comment:    INR reference interval applies to patients on anticoagulant therapy. A single INR therapeutic range for coumarins is not optimal for all indications; however, the suggested range for most indications is 2.0 - 3.0. Exceptions to the INR Reference Range may include: Prosthetic heart valves, acute myocardial infarction, prevention of myocardial infarction, and combinations of aspirin and anticoagulant. The need for a higher or lower target INR must be assessed individually. Reference: The Pharmacology and Management of the Vitamin K  antagonists: the seventh ACCP Conference on Antithrombotic and Thrombolytic Therapy. WUJWJ.1914 Sept:126 (3suppl): N9146842. A HCT value >55% may artifactually increase the PT.  In one study,  the increase was an average of 25%. Reference:  "Effect on Routine and Special Coagulation Testing Values of Citrate Anticoagulant Adjustment in Patients with High HCT Values." American Journal of Clinical Pathology 2006;126:400-405.          Passed - Valid encounter within last 3  months    Recent Outpatient Visits          1 week ago Aortic atherosclerosis ([place])   [place], [place] T, NP   1 month ago Chronic a-fib [facility])   [place], Megan P, DO   3 months ago Aortic atherosclerosis [facility])   [place], Megan P, DO   5 months ago Chronic atrial fibrillation ([place])   [facility] Kathrine Haddock, NP   6 months ago Chronic atrial fibrillation [facility])   [place] Kathrine Haddock, NP      Future Appointments            In 3 weeks Wynetta Emery, Barb Merino, DO [employer], [facility]   In 2 months Johnson, Megan P, DO [place], [facility]   In 8 months  [employer], [facility]            Requested Prescriptions  Pending Prescriptions Disp Refills   warfarin (COUMADIN) 1 MG tablet [Pharmacy Med Name: WARFARIN SODIUM 1 MG TABLET] 30 tablet 1    Sig: TAKE ONE TABLET BY MOUTH AT 6 P.M.     Hematology:  Anticoagulants - warfarin Failed - 01/27/2018  4:09 PM      Failed - If the patient is managed by Coumadin Clinic - route to their Pool. If not, forward to the provider.      Failed - INR in normal range and within 30 days    INR  Date Value Ref Range Status  01/20/2018 2.7 (H) 0.9 - 1.1 Final  01/27/2014 1.4  Final    Comment:    INR reference interval applies to patients on anticoagulant therapy. A single INR therapeutic range for coumarins is not optimal for all indications; however, the suggested range for most indications is 2.0 - 3.0. Exceptions to the INR Reference Range may include: Prosthetic heart valves, acute myocardial infarction, prevention of myocardial infarction, and combinations of aspirin and anticoagulant. The need for a higher or lower target INR must be assessed individually. Reference: The Pharmacology and Management of the Vitamin K  antagonists: the seventh ACCP Conference on Antithrombotic and Thrombolytic Therapy. WCBJS.2831  Sept:126 (3suppl): N9146842. A HCT value >55% may artifactually increase the PT.  In one study,  the increase was an average of 25%. Reference:  "Effect on Routine and Special Coagulation Testing Values of Citrate Anticoagulant Adjustment in Patients with High HCT Values." American Journal of Clinical Pathology 2006;126:400-405.          Passed - Valid encounter within last 3 months    Recent Outpatient Visits          1 week ago Aortic atherosclerosis ([place])   [place], [place] T, NP   1 month ago Chronic a-fib [facility])   [place], Megan P, DO   3 months ago Aortic atherosclerosis [facility])   [place], Megan P, DO   5 months ago Chronic atrial fibrillation [facility])   [facility] Kathrine Haddock, NP   6 months ago Chronic atrial fibrillation [facility])   [facility] Kathrine Haddock, NP      Future Appointments            In 3 weeks Wynetta Emery, Barb Merino, DO [employer], [place]   In 2 months Wynetta Emery, Barb Merino, DO [employer], [place]   In 8 months  [employer], [place]

## 2018-02-23 ENCOUNTER — Ambulatory Visit: Payer: Medicare HMO | Admitting: Family Medicine

## 2018-03-01 ENCOUNTER — Other Ambulatory Visit: Payer: Self-pay | Admitting: Nurse Practitioner

## 2018-03-01 NOTE — Telephone Encounter (Signed)
Requested medication (s) are due for refill today: yes  Requested medication (s) are on the active medication list: yes    Last refill: 01/20/18  #20  0 refills  Future visit scheduled yes  03/18/18  Dr. Wynetta Emery  Notes to clinic:not delegated  Requested Prescriptions  Pending Prescriptions Disp Refills   LORazepam (ATIVAN) 1 MG tablet [Pharmacy Med Name: LORAZEPAM 1 MG TABLET] 20 tablet 0    Sig: Take 1 tablet (1 mg total) by mouth as needed for anxiety (take one tablet at bedtime as needed).     Not Delegated - Psychiatry:  Anxiolytics/Hypnotics Failed - 03/01/2018  4:19 PM      Failed - This refill cannot be delegated      Failed - Urine Drug Screen completed in last 360 days.      Passed - Valid encounter within last 6 months    Recent Outpatient Visits          1 month ago Aortic atherosclerosis ([place])   [place], [place] T, NP   2 months ago Chronic a-fib [facility])   [place], Megan P, DO   4 months ago Aortic atherosclerosis [facility])   [place], Megan P, DO   6 months ago Chronic atrial fibrillation [facility])   [place] Kathrine Haddock, NP   7 months ago Chronic atrial fibrillation [facility])   [facility] Kathrine Haddock, NP      Future Appointments            In 2 weeks Wynetta Emery, Barb Merino, DO [employer], [place]   In 1 month Johnson, Barb Merino, DO [employer], [place]   In 7 months  [employer], [place]

## 2018-03-18 ENCOUNTER — Encounter: Payer: Self-pay | Admitting: Family Medicine

## 2018-03-18 ENCOUNTER — Ambulatory Visit: Payer: Medicare HMO | Admitting: Family Medicine

## 2018-03-18 VITALS — BP 183/67 | HR 96 | Temp 98.1°F | Ht 63.5 in | Wt 207.2 lb

## 2018-03-18 DIAGNOSIS — I482 Chronic atrial fibrillation, unspecified: Secondary | ICD-10-CM

## 2018-03-18 DIAGNOSIS — I480 Paroxysmal atrial fibrillation: Secondary | ICD-10-CM | POA: Diagnosis not present

## 2018-03-18 DIAGNOSIS — I10 Essential (primary) hypertension: Secondary | ICD-10-CM | POA: Diagnosis not present

## 2018-03-18 LAB — COAGUCHEK XS/INR WAIVED
INR: 2.9 — ABNORMAL HIGH (ref 0.9–1.1)
PROTHROMBIN TIME: 35.1 s

## 2018-03-18 MED ORDER — GLIPIZIDE ER 10 MG PO TB24: 10.0000 mg | ORAL_TABLET | Freq: Every day | ORAL | 1 refills | Status: DC

## 2018-03-18 MED ORDER — WARFARIN SODIUM 6 MG PO TABS: 6.0000 mg | ORAL_TABLET | Freq: Every day | ORAL | 1 refills | Status: DC

## 2018-03-18 MED ORDER — WARFARIN SODIUM 1 MG PO TABS: ORAL_TABLET | ORAL | 1 refills | Status: DC

## 2018-03-18 MED ORDER — HYDROCHLOROTHIAZIDE 25 MG PO TABS: 25.0000 mg | ORAL_TABLET | Freq: Every day | ORAL | 1 refills | Status: DC

## 2018-03-18 NOTE — Progress Notes (Signed)
   BP (!) 183/67   Pulse 96   Temp 98.1 F (36.7 C) (Oral)   Ht 5' 3.5" (1.613 m)   Wt 207 lb 3.2 oz (94 kg)   LMP  (LMP Unknown)   SpO2 97%   BMI 36.13 kg/m    Subjective:    Patient ID: Daisy Watkins, female    DOB: Aug 11, 1932, 84 y.o.   MRN: [id number]  CC: Coumadin management  HPI: This patient is a 84 y.o. female who presents for coumadin management. The expected duration of coumadin treatment is lifelong The reason for anticoagulation is  A. Fib.  Present Coumadin dose: 7mg  daily Goal: 2.0-3.0  Excessive bruising: no Nose bleeding: no Rectal bleeding: no Prolonged menstrual cycles: N/A Eating diet with consistent amounts of foods containing Vitamin K:yes Any recent antibiotic use? no  Relevant past medical, surgical, family and social history reviewed and updated as indicated. Interim medical history since our last visit reviewed. Allergies and medications reviewed and updated.  ROS: Per HPI unless specifically indicated above     Objective:    BP (!) 183/67   Pulse 96   Temp 98.1 F (36.7 C) (Oral)   Ht 5' 3.5" (1.613 m)   Wt 207 lb 3.2 oz (94 kg)   LMP  (LMP Unknown)   SpO2 97%   BMI 36.13 kg/m   Wt Readings from Last 3 Encounters:  03/18/18 207 lb 3.2 oz (94 kg)  01/20/18 206 lb (93.4 kg)  12/15/17 207 lb 9.6 oz (94.2 kg)     General: Well appearing, well nourished in no distress.  Normal mood and affect. Skin: No excessive bruising or rash  Last INR: 2.9 Last PT: 35     Last CBC:  Lab Results  Component Value Date   WBC 6.4 10/26/2017   HGB 13.6 10/26/2017   HCT 41.6 10/26/2017   MCV 88 10/26/2017   PLT 256 10/26/2017    Results for orders placed or performed in visit on 01/20/18  CoaguChek XS/INR Waived  Result Value Ref Range   INR 2.7 (H) 0.9 - 1.1   Prothrombin Time 32.6 sec       Assessment:     ICD-10-CM   1. Chronic a-fib I48.20 CoaguChek XS/INR Waived  2. Benign hypertension I10 hydrochlorothiazide (HYDRODIURIL) 25 MG  tablet  3. Paroxysmal atrial fibrillation (HCC) I48.0 warfarin (COUMADIN) 1 MG tablet    warfarin (COUMADIN) 6 MG tablet    Plan:   Discussed current plan face-to-face with patient. For coumadin dosing, elected to continue current dose. Will plan to recheck INR in 1 month.

## 2018-03-22 MED ORDER — LORAZEPAM 1 MG PO TABS: 1.0000 mg | ORAL_TABLET | ORAL | 0 refills | Status: DC | PRN

## 2018-03-22 NOTE — Addendum Note (Signed)
Addended by: Valerie Roys on: 03/22/2018 02:08 PM   Modules accepted: Orders

## 2018-03-22 NOTE — Telephone Encounter (Signed)
Called [facility]. [facility] pharmacy was [facility] until [facility] closed down and [facility] does not have this prescription on the patient's file since it has not been filled there. Last written to [facility] 03/02/18 for #20, no refills.

## 2018-03-22 NOTE — Telephone Encounter (Signed)
New Rx sent to [facility].  

## 2018-03-22 NOTE — Telephone Encounter (Signed)
Patient called in and stated  [id number] Daisy Watkins, [place] - [place] [phone] (Phone) [phone] (Fax)    doesn't have her lorazapem and she has ran out completly

## 2018-03-22 NOTE — Telephone Encounter (Signed)
Please confirm that they did not get the script Dr. Wynetta Emery sent in on 12/3 and if this is the case I am happy to re-send it

## 2018-04-09 ENCOUNTER — Other Ambulatory Visit: Payer: Self-pay | Admitting: Family Medicine

## 2018-04-09 NOTE — Telephone Encounter (Signed)
Requested medication (s) are due for refill today: yes  Requested medication (s) are on the active medication list: yes  Last refill:  03/22/2018  Future visit scheduled: yes  Notes to clinic:  Not delegated    Requested Prescriptions  Pending Prescriptions Disp Refills   LORazepam (ATIVAN) 1 MG tablet [Pharmacy Med Name: LORAZEPAM 1MG  TABLETS] 20 tablet     Sig: TAKE 1 TABLET(1 MG) BY MOUTH AT BEDTIME AS NEEDED FOR ANXIETY     Not Delegated - Psychiatry:  Anxiolytics/Hypnotics Failed - 04/09/2018  1:36 PM      Failed - This refill cannot be delegated      Failed - Urine Drug Screen completed in last 360 days.      Passed - Valid encounter within last 6 months    Recent Outpatient Visits          3 weeks ago Chronic a-fib   [place], DO   2 months ago Aortic atherosclerosis ([place])   [place], Henrine Screws T, NP   3 months ago Chronic a-fib [facility])   [place], Megan P, DO   5 months ago Aortic atherosclerosis [facility])   [place], Megan P, DO   7 months ago Chronic atrial fibrillation [facility])   [place], Hartly, NP      Future Appointments            In 2 months Wynetta Emery, Barb Merino, DO [place], [place]   In 6 months  [employer], [place]

## 2018-04-09 NOTE — Telephone Encounter (Signed)
Needs follow up appointment in January. Cannot have refill of lorazepam until that appointment.

## 2018-04-12 NOTE — Telephone Encounter (Signed)
Please advise 

## 2018-04-12 NOTE — Telephone Encounter (Signed)
Patient would like a refill of her LORazepam (ATIVAN) 1 MG tablet medication until her appt on 04/22/2018 because she is out of medication and she cannot sleep.  Please send the prescription to her preferred pharmacy [facility] on [address]

## 2018-04-19 ENCOUNTER — Encounter: Payer: Self-pay | Admitting: Family Medicine

## 2018-04-22 ENCOUNTER — Ambulatory Visit: Payer: Medicare HMO | Admitting: Family Medicine

## 2018-04-22 ENCOUNTER — Encounter: Payer: Self-pay | Admitting: Family Medicine

## 2018-04-22 VITALS — BP 190/68 | HR 65 | Temp 97.9°F | Ht 63.0 in | Wt 205.8 lb

## 2018-04-22 DIAGNOSIS — F5101 Primary insomnia: Secondary | ICD-10-CM

## 2018-04-22 DIAGNOSIS — I10 Essential (primary) hypertension: Secondary | ICD-10-CM

## 2018-04-22 DIAGNOSIS — I700 Atherosclerosis of aorta: Secondary | ICD-10-CM | POA: Diagnosis not present

## 2018-04-22 DIAGNOSIS — I82403 Acute embolism and thrombosis of unspecified deep veins of lower extremity, bilateral: Secondary | ICD-10-CM

## 2018-04-22 DIAGNOSIS — E1165 Type 2 diabetes mellitus with hyperglycemia: Secondary | ICD-10-CM

## 2018-04-22 DIAGNOSIS — J069 Acute upper respiratory infection, unspecified: Secondary | ICD-10-CM | POA: Diagnosis not present

## 2018-04-22 DIAGNOSIS — I480 Paroxysmal atrial fibrillation: Secondary | ICD-10-CM | POA: Diagnosis not present

## 2018-04-22 DIAGNOSIS — IMO0001 Reserved for inherently not codable concepts without codable children: Secondary | ICD-10-CM

## 2018-04-22 DIAGNOSIS — I482 Chronic atrial fibrillation, unspecified: Secondary | ICD-10-CM | POA: Diagnosis not present

## 2018-04-22 LAB — COAGUCHEK XS/INR WAIVED
INR: 2.9 — ABNORMAL HIGH (ref 0.9–1.1)
Prothrombin Time: 34.3 s

## 2018-04-22 LAB — BAYER DCA HB A1C WAIVED: HB A1C (BAYER DCA - WAIVED): 7.6 % — ABNORMAL HIGH (ref <7.0)

## 2018-04-22 MED ORDER — LORAZEPAM 1 MG PO TABS: 1.0000 mg | ORAL_TABLET | Freq: Every evening | ORAL | 0 refills | Status: AC | PRN

## 2018-04-22 MED ORDER — AMLODIPINE BESYLATE 10 MG PO TABS: 10.0000 mg | ORAL_TABLET | Freq: Every day | ORAL | 3 refills | Status: DC

## 2018-04-22 MED ORDER — LORAZEPAM 1 MG PO TABS: 1.0000 mg | ORAL_TABLET | Freq: Every evening | ORAL | 0 refills | Status: DC | PRN

## 2018-04-22 MED ORDER — RAMIPRIL 10 MG PO CAPS: 10.0000 mg | ORAL_CAPSULE | Freq: Every day | ORAL | 1 refills | Status: DC

## 2018-04-22 MED ORDER — PREDNISONE 50 MG PO TABS: 50.0000 mg | ORAL_TABLET | Freq: Every day | ORAL | 0 refills | Status: DC

## 2018-04-22 NOTE — Assessment & Plan Note (Signed)
INR 2.9- continue current regimen. Continue to monitor. Recheck 1 month.

## 2018-04-22 NOTE — Assessment & Plan Note (Signed)
A1c improved at 7.6. Will continue current regimen. Continue to monitor. Call with any concerns.

## 2018-04-22 NOTE — Assessment & Plan Note (Signed)
Will keep BP, cholesterol and sugars under good control. Continue to monitor.  

## 2018-04-22 NOTE — Assessment & Plan Note (Signed)
Not under good control at all. Cannot tolerate HCTZ- stopped. Will increase her amlodipine to 10mg  and recheck 1 week.

## 2018-04-22 NOTE — Progress Notes (Signed)
BP (!) 190/68 (BP Location: Left Arm, Patient Position: Sitting, Cuff Size: Normal)   Pulse 65   Temp 97.9 F (36.6 C) (Oral)   Ht 5\' 3"  (1.6 m)   Wt 205 lb 12.8 oz (93.4 kg)   LMP  (LMP Unknown)   SpO2 98%   BMI 36.46 kg/m    Subjective:    Patient ID: Daisy Watkins, female    DOB: 04/03/1932, 85 y.o.   MRN: [id number]  HPI: Daisy Watkins is a 85 y.o. female  Chief Complaint  Patient presents with  . Insomnia    Patient states that she hasn't slept in 7-8 days because she was refused her lorazepam. Patient states she needs her lorazepam to sleep.   Marland Kitchen Hypertension    Patient hasn't been taking HCTZ. Patient states it makes her stomach hurt.  . Atrial Fibrillation  . Diabetes  . Ear Pain  . Sinusitis  . Nasal Congestion  . Cough   INSOMNIA- has been out of her lorazepam for a few days. Very upset today. Duration: chronic Satisfied with sleep quality: no Difficulty falling asleep: yes Difficulty staying asleep: yes Waking a few hours after sleep onset: yes Early morning awakenings: yes Daytime hypersomnolence: no Wakes feeling refreshed: no Good sleep hygiene: no Apnea: no Snoring: no Depressed/anxious mood: no Recent stress: no Restless legs/nocturnal leg cramps: no Chronic pain/arthritis: no History of sleep study: yes Treatments attempted: melatonin, uinsom, benadryl and ambien   HYPERTENSION- has not been taking her HCTZ or her amlpodipine.  Hypertension status: uncontrolled  Satisfied with current treatment? no Duration of hypertension: chronic BP monitoring frequency:  not checking BP range:  BP medication side effects:  no Medication compliance: fair compliance Previous BP meds: ramipril Aspirin: no Recurrent headaches: no Visual changes: no Palpitations: no Dyspnea: no Chest pain: no Lower extremity edema: no Dizzy/lightheaded: no  Coumadin Management.  The expected duration of coumadin treatment is lifelong The reason for anticoagulation is   A. Fib.  Present Coumadin dose: 7mg  daily Goal: 2.0-3.0  Excessive bruising: no Nose bleeding: no Rectal bleeding: no Prolonged menstrual cycles: N/A Eating diet with consistent amounts of foods containing Vitamin K:yes Any recent antibiotic use? no  DIABETES- has not been taking her metformin. Hypoglycemic episodes:no Polydipsia/polyuria: no Visual disturbance: no Chest pain: no Paresthesias: no Glucose Monitoring: yes  Accucheck frequency: Daily  Fasting glucose: 100s in the AMs Taking Insulin?: yes  Long acting insulin:  Short acting insulin: Blood Pressure Monitoring: not checking Retinal Examination: Not up to Date Foot Exam: Up to Date Diabetic Education: Completed Pneumovax: Up to Date Influenza: Up to Date Aspirin: no  UPPER RESPIRATORY TRACT INFECTION Duration: 1.5 week Worst symptom: sinus pressure Fever: no Cough: yes Shortness of breath: no Wheezing: yes Chest pain: no Chest tightness: yes Chest congestion: yes Nasal congestion: yes Runny nose: yes Post nasal drip: yes Sneezing: yes Sore throat: no Swollen glands: no Sinus pressure: yes Headache: yes Face pain: no Toothache: no Ear pain: no  Ear pressure: yes "right Eyes red/itching:no Eye drainage/crusting: no  Vomiting: no Rash: no Fatigue: yes Sick contacts: yes Strep contacts: no  Context: fluctuating Recurrent sinusitis: no Relief with OTC cold/cough medications: no  Treatments attempted: none    Relevant past medical, surgical, family and social history reviewed and updated as indicated. Interim medical history since our last visit reviewed. Allergies and medications reviewed and updated.  Review of Systems  Constitutional: Positive for chills, diaphoresis and fatigue. Negative for activity change,  appetite change, fever and unexpected weight change.  HENT: Positive for congestion, postnasal drip, rhinorrhea and sinus pressure. Negative for dental problem, drooling, ear  discharge, ear pain, facial swelling, hearing loss, mouth sores, nosebleeds, sinus pain, sneezing, sore throat, tinnitus, trouble swallowing and voice change.   Eyes: Negative.   Respiratory: Positive for cough, chest tightness, shortness of breath and wheezing. Negative for apnea, choking and stridor.   Cardiovascular: Negative.   Gastrointestinal: Negative.   Neurological: Negative.   Psychiatric/Behavioral: Negative.     Per HPI unless specifically indicated above     Objective:    BP (!) 190/68 (BP Location: Left Arm, Patient Position: Sitting, Cuff Size: Normal)   Pulse 65   Temp 97.9 F (36.6 C) (Oral)   Ht 5\' 3"  (1.6 m)   Wt 205 lb 12.8 oz (93.4 kg)   LMP  (LMP Unknown)   SpO2 98%   BMI 36.46 kg/m   Wt Readings from Last 3 Encounters:  04/22/18 205 lb 12.8 oz (93.4 kg)  03/18/18 207 lb 3.2 oz (94 kg)  01/20/18 206 lb (93.4 kg)    Physical Exam Vitals signs and nursing note reviewed.  Constitutional:      General: She is not in acute distress.    Appearance: Normal appearance. She is not ill-appearing, toxic-appearing or diaphoretic.  HENT:     Head: Normocephalic and atraumatic.     Right Ear: Tympanic membrane, ear canal and external ear normal. There is no impacted cerumen.     Left Ear: Tympanic membrane, ear canal and external ear normal. There is no impacted cerumen.     Nose: Congestion and rhinorrhea present.     Mouth/Throat:     Mouth: Mucous membranes are moist.     Pharynx: Oropharynx is clear. No oropharyngeal exudate or posterior oropharyngeal erythema.  Eyes:     General: No scleral icterus.       Right eye: No discharge.        Left eye: No discharge.     Extraocular Movements: Extraocular movements intact.     Conjunctiva/sclera: Conjunctivae normal.     Pupils: Pupils are equal, round, and reactive to light.  Neck:     Musculoskeletal: Normal range of motion and neck supple. No neck rigidity or muscular tenderness.     Vascular: No carotid  bruit.  Cardiovascular:     Rate and Rhythm: Normal rate and regular rhythm.     Pulses: Normal pulses.     Heart sounds: Normal heart sounds. No murmur. No friction rub. No gallop.   Pulmonary:     Effort: Pulmonary effort is normal. No respiratory distress.     Breath sounds: Normal breath sounds. No stridor. No wheezing, rhonchi or rales.  Chest:     Chest wall: No tenderness.  Musculoskeletal: Normal range of motion.  Lymphadenopathy:     Cervical: Cervical adenopathy present.  Skin:    General: Skin is warm and dry.     Capillary Refill: Capillary refill takes less than 2 seconds.     Coloration: Skin is not jaundiced or pale.     Findings: No bruising, erythema, lesion or rash.  Neurological:     General: No focal deficit present.     Mental Status: She is alert and oriented to person, place, and time. Mental status is at baseline.     Cranial Nerves: No cranial nerve deficit.     Sensory: No sensory deficit.     Motor: No weakness.  Coordination: Coordination normal.     Gait: Gait normal.     Deep Tendon Reflexes: Reflexes normal.  Psychiatric:        Mood and Affect: Mood normal.        Behavior: Behavior normal.        Thought Content: Thought content normal.        Judgment: Judgment normal.     Results for orders placed or performed in visit on 03/18/18  CoaguChek XS/INR Waived  Result Value Ref Range   INR 2.9 (H) 0.9 - 1.1   Prothrombin Time 35.1 sec      Assessment & Plan:   Problem List Items Addressed This Visit      Cardiovascular and Mediastinum   Aortic atherosclerosis (HCC) (Chronic)    Will keep BP, cholesterol and sugars under good control. Continue to monitor.       Relevant Medications   amLODipine (NORVASC) 10 MG tablet   ramipril (ALTACE) 10 MG capsule   Benign essential hypertension    Not under good control at all. Cannot tolerate HCTZ- stopped. Will increase her amlodipine to 10mg  and recheck 1 week.       Relevant Medications    amLODipine (NORVASC) 10 MG tablet   ramipril (ALTACE) 10 MG capsule   Paroxysmal A-fib (HCC)    INR 2.9- continue current regimen. Continue to monitor. Recheck 1 month.      Relevant Medications   amLODipine (NORVASC) 10 MG tablet   ramipril (ALTACE) 10 MG capsule   Other Relevant Orders   CoaguChek XS/INR Waived   DVT, lower extremity, recurrent (HCC)    INR 2.9- continue current regimen. Continue to monitor. Recheck 1 month.      Relevant Medications   amLODipine (NORVASC) 10 MG tablet   ramipril (ALTACE) 10 MG capsule     Endocrine   Diabetes mellitus type 2, uncontrolled, without complications (HCC) - Primary (Chronic)    A1c improved at 7.6. Will continue current regimen. Continue to monitor. Call with any concerns.       Relevant Medications   ramipril (ALTACE) 10 MG capsule   Other Relevant Orders   Bayer DCA Hb A1c Waived     Other   Insomnia    Stable on lorazepam. Will continue with it. 3 month supply given today. Will need to keep appointments to get refills.        Other Visit Diagnoses    Viral upper respiratory tract infection       No sign of bacterial infection. Will give burst of prednisone and recheck next week. Call with any concerns.        Follow up plan: Return in about 1 week (around 04/29/2018) for follow up BP and cold symptoms.

## 2018-04-22 NOTE — Assessment & Plan Note (Signed)
Stable on lorazepam. Will continue with it. 3 month supply given today. Will need to keep appointments to get refills.

## 2018-04-30 ENCOUNTER — Encounter: Payer: Self-pay | Admitting: Family Medicine

## 2018-04-30 ENCOUNTER — Ambulatory Visit (INDEPENDENT_AMBULATORY_CARE_PROVIDER_SITE_OTHER): Payer: 59 | Admitting: Family Medicine

## 2018-04-30 ENCOUNTER — Other Ambulatory Visit: Payer: Self-pay

## 2018-04-30 VITALS — BP 138/75 | HR 74 | Temp 97.4°F | Ht 63.0 in | Wt 204.0 lb

## 2018-04-30 DIAGNOSIS — I10 Essential (primary) hypertension: Secondary | ICD-10-CM

## 2018-04-30 NOTE — Assessment & Plan Note (Signed)
Under good control. Continue current regimen. Continue to monitor. Call with any concerns. Checking BMP today.

## 2018-04-30 NOTE — Progress Notes (Signed)
BP 138/75   Pulse 74   Temp (!) 97.4 F (36.3 C) (Oral)   Ht 5\' 3"  (1.6 m)   Wt 204 lb (92.5 kg)   LMP  (LMP Unknown)   SpO2 99%   BMI 36.14 kg/m    Subjective:    Patient ID: Daisy Watkins, female    DOB: Jan 16, 1933, 85 y.o.   MRN: [id number]  HPI: Daisy Watkins is a 85 y.o. female  Chief Complaint  Patient presents with  . Hypertension    f/u  . URI    pt states that she is feeling better now   HYPERTENSION Hypertension status: better  Satisfied with current treatment? yes Duration of hypertension: chronic BP monitoring frequency:  not checking BP range:  BP medication side effects:  no Medication compliance: good compliance Previous BP meds:ramipril and amlodipine Aspirin: no Recurrent headaches: no Visual changes: no Palpitations: no Dyspnea: no Chest pain: no Lower extremity edema: no Dizzy/lightheaded: no  Relevant past medical, surgical, family and social history reviewed and updated as indicated. Interim medical history since our last visit reviewed. Allergies and medications reviewed and updated.  Review of Systems  Constitutional: Negative.   Respiratory: Negative.   Cardiovascular: Negative.   Neurological: Negative.   Psychiatric/Behavioral: Negative.     Per HPI unless specifically indicated above     Objective:    BP 138/75   Pulse 74   Temp (!) 97.4 F (36.3 C) (Oral)   Ht 5\' 3"  (1.6 m)   Wt 204 lb (92.5 kg)   LMP  (LMP Unknown)   SpO2 99%   BMI 36.14 kg/m   Wt Readings from Last 3 Encounters:  04/30/18 204 lb (92.5 kg)  04/22/18 205 lb 12.8 oz (93.4 kg)  03/18/18 207 lb 3.2 oz (94 kg)    Physical Exam Vitals signs and nursing note reviewed.  Constitutional:      General: She is not in acute distress.    Appearance: Normal appearance. She is not ill-appearing, toxic-appearing or diaphoretic.  HENT:     Head: Normocephalic and atraumatic.     Right Ear: External ear normal.     Left Ear: External ear normal.     Nose:  Nose normal.     Mouth/Throat:     Mouth: Mucous membranes are moist.     Pharynx: Oropharynx is clear.  Eyes:     General: No scleral icterus.       Right eye: No discharge.        Left eye: No discharge.     Extraocular Movements: Extraocular movements intact.     Conjunctiva/sclera: Conjunctivae normal.     Pupils: Pupils are equal, round, and reactive to light.  Neck:     Musculoskeletal: Normal range of motion and neck supple.  Cardiovascular:     Rate and Rhythm: Normal rate and regular rhythm.     Pulses: Normal pulses.     Heart sounds: Normal heart sounds. No murmur. No friction rub. No gallop.   Pulmonary:     Effort: Pulmonary effort is normal. No respiratory distress.     Breath sounds: Normal breath sounds. No stridor. No wheezing, rhonchi or rales.  Chest:     Chest wall: No tenderness.  Musculoskeletal: Normal range of motion.  Skin:    General: Skin is warm and dry.     Capillary Refill: Capillary refill takes less than 2 seconds.     Coloration: Skin is not jaundiced or pale.  Findings: No bruising, erythema, lesion or rash.  Neurological:     General: No focal deficit present.     Mental Status: She is alert and oriented to person, place, and time. Mental status is at baseline.  Psychiatric:        Mood and Affect: Mood normal.        Behavior: Behavior normal.        Thought Content: Thought content normal.        Judgment: Judgment normal.     Results for orders placed or performed in visit on 04/22/18  Bayer DCA Hb A1c Waived  Result Value Ref Range   HB A1C (BAYER DCA - WAIVED) 7.6 (H) <7.0 %  CoaguChek XS/INR Waived  Result Value Ref Range   INR 2.9 (H) 0.9 - 1.1   Prothrombin Time 34.3 sec      Assessment & Plan:   Problem List Items Addressed This Visit      Cardiovascular and Mediastinum   Benign essential hypertension - Primary    Under good control. Continue current regimen. Continue to monitor. Call with any concerns. Checking BMP  today.      Relevant Orders   Basic metabolic panel       Follow up plan: Return in about 3 weeks (around 05/21/2018) for INR.

## 2018-05-28 ENCOUNTER — Ambulatory Visit (INDEPENDENT_AMBULATORY_CARE_PROVIDER_SITE_OTHER): Payer: Medicare HMO | Admitting: Family Medicine

## 2018-05-28 ENCOUNTER — Other Ambulatory Visit: Payer: Self-pay

## 2018-05-28 ENCOUNTER — Encounter: Payer: Self-pay | Admitting: Family Medicine

## 2018-05-28 VITALS — BP 120/73 | HR 79 | Temp 97.6°F | Ht 63.0 in | Wt 205.0 lb

## 2018-05-28 DIAGNOSIS — R350 Frequency of micturition: Secondary | ICD-10-CM

## 2018-05-28 DIAGNOSIS — N3000 Acute cystitis without hematuria: Secondary | ICD-10-CM

## 2018-05-28 DIAGNOSIS — H6983 Other specified disorders of Eustachian tube, bilateral: Secondary | ICD-10-CM | POA: Diagnosis not present

## 2018-05-28 DIAGNOSIS — I700 Atherosclerosis of aorta: Secondary | ICD-10-CM | POA: Diagnosis not present

## 2018-05-28 DIAGNOSIS — I480 Paroxysmal atrial fibrillation: Secondary | ICD-10-CM | POA: Diagnosis not present

## 2018-05-28 DIAGNOSIS — H60543 Acute eczematoid otitis externa, bilateral: Secondary | ICD-10-CM | POA: Diagnosis not present

## 2018-05-28 LAB — COAGUCHEK XS/INR WAIVED
INR: 2.8 — ABNORMAL HIGH (ref 0.9–1.1)
Prothrombin Time: 33.2 s

## 2018-05-28 MED ORDER — HYDROCORTISONE-ACETIC ACID 1-2 % OT SOLN: 3.0000 [drp] | Freq: Two times a day (BID) | OTIC | 0 refills | Status: DC

## 2018-05-28 MED ORDER — CIPROFLOXACIN HCL 500 MG PO TABS: 500.0000 mg | ORAL_TABLET | Freq: Two times a day (BID) | ORAL | 0 refills | Status: DC

## 2018-05-28 NOTE — Progress Notes (Signed)
BP 120/73   Pulse 79   Temp 97.6 F (36.4 C) (Oral)   Ht 5\' 3"  (1.6 m)   Wt 205 lb (93 kg)   LMP  (LMP Unknown)   SpO2 97%   BMI 36.31 kg/m    Subjective:    Patient ID: Daisy Watkins, female    DOB: Sep 02, 1932, 85 y.o.   MRN: [id number]  HPI: Daisy Watkins is a 85 y.o. female  Chief Complaint  Patient presents with  . Coagulation Disorder  . Headache    x about a month  . Ear Pain    bilateral  . Urinary Frequency    x 2-3 weeks   Coumadin Management.  The expected duration of coumadin treatment is lifelong The reason for anticoagulation is  A. Fib.  Present Coumadin dose: Goal: 2.0-3.0  Excessive bruising: no Nose bleeding: no Rectal bleeding: no Prolonged menstrual cycles: N/A Eating diet with consistent amounts of foods containing Vitamin K: yes Any recent antibiotic use? no   URINARY SYMPTOMS Duration: 2-3 weeks Dysuria: burning Urinary frequency: yes Urgency: yes Small volume voids: yes Symptom severity: moderate Urinary incontinence: yes Foul odor: yes Hematuria: no Abdominal pain: yes Back pain: yes Suprapubic pain/pressure: yes Flank pain: yes Fever:  no Vomiting: no Relief with cranberry juice: no Relief with pyridium: no Status worse Previous urinary tract infection: no Recurrent urinary tract infection: no Treatments attempted: increasing fluids   EAG CLOGGED Duration: days Involved ear(s):  bilateral Sensation of feeling clogged/plugged: yes Decreased/muffled hearing:yes Ear pain: no Fever: no Otorrhea: no Hearing loss: no Upper respiratory infection symptoms: no Using Q-Tips: no Status: stable History of cerumenosis: no Treatments attempted: none    Relevant past medical, surgical, family and social history reviewed and updated as indicated. Interim medical history since our last visit reviewed. Allergies and medications reviewed and updated.  Review of Systems  Constitutional: Negative.   HENT: Negative.   Eyes:  Negative.   Respiratory: Negative.   Cardiovascular: Negative.   Gastrointestinal: Negative.   Genitourinary: Positive for dysuria, frequency and urgency. Negative for decreased urine volume, difficulty urinating, dyspareunia, enuresis, flank pain, genital sores, hematuria, menstrual problem, pelvic pain, vaginal bleeding, vaginal discharge and vaginal pain.  Psychiatric/Behavioral: Negative.     Per HPI unless specifically indicated above     Objective:    BP 120/73   Pulse 79   Temp 97.6 F (36.4 C) (Oral)   Ht 5\' 3"  (1.6 m)   Wt 205 lb (93 kg)   LMP  (LMP Unknown)   SpO2 97%   BMI 36.31 kg/m   Wt Readings from Last 3 Encounters:  05/28/18 205 lb (93 kg)  04/30/18 204 lb (92.5 kg)  04/22/18 205 lb 12.8 oz (93.4 kg)    Physical Exam Vitals signs and nursing note reviewed.  Constitutional:      General: She is not in acute distress.    Appearance: Normal appearance. She is not ill-appearing, toxic-appearing or diaphoretic.  HENT:     Head: Normocephalic and atraumatic.     Right Ear: External ear normal.     Left Ear: External ear normal.     Nose: Nose normal.     Mouth/Throat:     Mouth: Mucous membranes are moist.     Pharynx: Oropharynx is clear.  Eyes:     General: No scleral icterus.       Right eye: No discharge.        Left eye: No discharge.  Extraocular Movements: Extraocular movements intact.     Right eye: Normal extraocular motion and no nystagmus.     Left eye: Normal extraocular motion and no nystagmus.     Conjunctiva/sclera: Conjunctivae normal.     Pupils: Pupils are equal, round, and reactive to light. Pupils are equal.     Right eye: Pupil is round and reactive.     Left eye: Pupil is round and reactive.  Neck:     Musculoskeletal: Normal range of motion and neck supple. No neck rigidity.  Cardiovascular:     Rate and Rhythm: Normal rate and regular rhythm.     Pulses: Normal pulses.     Heart sounds: Normal heart sounds. No murmur. No  friction rub. No gallop.   Pulmonary:     Effort: Pulmonary effort is normal. No respiratory distress.     Breath sounds: Normal breath sounds. No stridor. No wheezing, rhonchi or rales.  Chest:     Chest wall: No tenderness.  Musculoskeletal: Normal range of motion.  Lymphadenopathy:     Cervical: No cervical adenopathy.  Skin:    General: Skin is warm and dry.     Capillary Refill: Capillary refill takes less than 2 seconds.     Coloration: Skin is not jaundiced or pale.     Findings: No bruising, erythema, lesion or rash.  Neurological:     General: No focal deficit present.     Mental Status: She is alert and oriented to person, place, and time. Mental status is at baseline.  Psychiatric:        Mood and Affect: Mood normal.        Behavior: Behavior normal.        Thought Content: Thought content normal.        Judgment: Judgment normal.     Results for orders placed or performed in visit on 05/28/18  Microscopic Examination  Result Value Ref Range   WBC, UA 0-5 0 - 5 /hpf   RBC, UA 0-2 0 - 2 /hpf   Epithelial Cells (non renal) 0-10 0 - 10 /hpf   Casts Present None seen /lpf   Cast Type Hyaline casts N/A   Bacteria, UA Many (A) None seen/Few  Urine Culture, Reflex  Result Value Ref Range   Urine Culture, Routine Final report    Organism ID, Bacteria Comment   CoaguChek XS/INR Waived  Result Value Ref Range   INR 2.8 (H) 0.9 - 1.1   Prothrombin Time 33.2 sec  UA/M w/rflx Culture, Routine  Result Value Ref Range   Specific Gravity, UA 1.015 1.005 - 1.030   pH, UA 6.5 5.0 - 7.5   Color, UA Yellow Yellow   Appearance Ur Hazy (A) Clear   Leukocytes, UA Trace (A) Negative   Protein, UA Negative Negative/Trace   Glucose, UA Trace (A) Negative   Ketones, UA Negative Negative   RBC, UA Trace (A) Negative   Bilirubin, UA Negative Negative   Urobilinogen, Ur 1.0 0.2 - 1.0 mg/dL   Nitrite, UA Negative Negative   Microscopic Examination See below:    Urinalysis Reflex  Comment       Assessment & Plan:   Problem List Items Addressed This Visit      Cardiovascular and Mediastinum   Paroxysmal A-fib ([facility]) - Primary    Under good control with INR of 2.8. Continue current regimen. Continue to monitor. Call with any concerns. Recheck 1 month.       Relevant Orders  CoaguChek XS/INR Waived (Completed)    Other Visit Diagnoses    Acute cystitis without hematuria       Will treat with cipro. Call with any concerns. Continue to monitor.    Urinary frequency       +UTI   Relevant Orders   UA/M w/rflx Culture, Routine (Completed)   Dysfunction of both eustachian tubes       Will continue flonase. Conitnue to monitor. Call if not getting better or getting wose.        Follow up plan: Return in about 4 weeks (around 06/25/2018).

## 2018-05-30 ENCOUNTER — Encounter: Payer: Self-pay | Admitting: Family Medicine

## 2018-05-30 LAB — UA/M W/RFLX CULTURE, ROUTINE
BILIRUBIN UA: NEGATIVE
Ketones, UA: NEGATIVE
Nitrite, UA: NEGATIVE
Protein, UA: NEGATIVE
Specific Gravity, UA: 1.015 (ref 1.005–1.030)
Urobilinogen, Ur: 1 mg/dL (ref 0.2–1.0)
pH, UA: 6.5 (ref 5.0–7.5)

## 2018-05-30 LAB — URINE CULTURE, REFLEX

## 2018-05-30 LAB — MICROSCOPIC EXAMINATION

## 2018-05-30 NOTE — Assessment & Plan Note (Signed)
Under good control with INR of 2.8. Continue current regimen. Continue to monitor. Call with any concerns. Recheck 1 month.

## 2018-05-31 ENCOUNTER — Telehealth: Payer: Self-pay | Admitting: Family Medicine

## 2018-05-31 NOTE — Telephone Encounter (Signed)
Please let her know that her urine test came back negative for bacteria. Thanks!

## 2018-05-31 NOTE — Telephone Encounter (Signed)
Message relayed to patient. Verbalized understanding and denied questions.   

## 2018-06-17 ENCOUNTER — Encounter: Payer: Medicare HMO | Admitting: Family Medicine

## 2018-06-25 ENCOUNTER — Other Ambulatory Visit: Payer: Self-pay

## 2018-06-25 ENCOUNTER — Encounter: Payer: Self-pay | Admitting: Family Medicine

## 2018-06-25 ENCOUNTER — Ambulatory Visit: Payer: Medicare HMO | Admitting: Family Medicine

## 2018-06-25 ENCOUNTER — Telehealth: Payer: Self-pay | Admitting: Family Medicine

## 2018-06-25 ENCOUNTER — Ambulatory Visit (INDEPENDENT_AMBULATORY_CARE_PROVIDER_SITE_OTHER): Payer: Medicare HMO | Admitting: Family Medicine

## 2018-06-25 VITALS — BP 167/100 | HR 85 | Wt 209.0 lb

## 2018-06-25 DIAGNOSIS — I10 Essential (primary) hypertension: Secondary | ICD-10-CM | POA: Diagnosis not present

## 2018-06-25 DIAGNOSIS — I480 Paroxysmal atrial fibrillation: Secondary | ICD-10-CM

## 2018-06-25 DIAGNOSIS — I82403 Acute embolism and thrombosis of unspecified deep veins of lower extremity, bilateral: Secondary | ICD-10-CM

## 2018-06-25 DIAGNOSIS — F411 Generalized anxiety disorder: Secondary | ICD-10-CM | POA: Diagnosis not present

## 2018-06-25 MED ORDER — RAMIPRIL 5 MG PO CAPS: 5.0000 mg | ORAL_CAPSULE | Freq: Every day | ORAL | 1 refills | Status: DC

## 2018-06-25 MED ORDER — RAMIPRIL 10 MG PO CAPS: 10.0000 mg | ORAL_CAPSULE | Freq: Every day | ORAL | 1 refills | Status: DC

## 2018-06-25 NOTE — Progress Notes (Signed)
BP (!) 167/100 Comment: Patient reported  Pulse 85 Comment: Patient reported  Wt 209 lb (94.8 kg)   LMP  (LMP Unknown)   BMI 37.02 kg/m    Subjective:    Patient ID: Daisy Watkins, female    DOB: May 03, 1932, 85 y.o.   MRN: [id number]  HPI: Daisy Watkins is a 85 y.o. female  Chief Complaint  Patient presents with  . Atrial Fibrillation  . TELEMEDICINE VISIT   Coumadin Management.  The expected duration of coumadin treatment is lifelong The reason for anticoagulation is  A. Fib.  Goal: 2.0-3.0  Excessive bruising: no Nose bleeding: no Rectal bleeding: no Prolonged menstrual cycles: N/A Eating diet with consistent amounts of foods containing Vitamin K:yes Any recent antibiotic use? no  HYPERTENSION Hypertension status: uncontrolled  Satisfied with current treatment? yes Duration of hypertension: chronic BP monitoring frequency:  a few times a day BP range: 160s BP medication side effects:  no Medication compliance: good compliance Previous BP meds: ramipril, amlodipine Aspirin: no Recurrent headaches: no Visual changes: no Palpitations: no Dyspnea: no Chest pain: no Lower extremity edema: no Dizzy/lightheaded: no  Relevant past medical, surgical, family and social history reviewed and updated as indicated. Interim medical history since our last visit reviewed. Allergies and medications reviewed and updated.  Review of Systems  Constitutional: Negative.   Respiratory: Negative.   Cardiovascular: Negative.   Neurological: Negative.   Psychiatric/Behavioral: Negative.     Per HPI unless specifically indicated above     Objective:    BP (!) 167/100 Comment: Patient reported  Pulse 85 Comment: Patient reported  Wt 209 lb (94.8 kg)   LMP  (LMP Unknown)   BMI 37.02 kg/m   Wt Readings from Last 3 Encounters:  06/25/18 209 lb (94.8 kg)  05/28/18 205 lb (93 kg)  04/30/18 204 lb (92.5 kg)    Physical Exam Vitals signs and nursing note reviewed.   Pulmonary:     Effort: Pulmonary effort is normal. No respiratory distress.     Comments: Speaking in full sentences Neurological:     Mental Status: She is alert.  Psychiatric:        Mood and Affect: Mood normal.        Behavior: Behavior normal.        Thought Content: Thought content normal.        Judgment: Judgment normal.     Results for orders placed or performed in visit on 05/28/18  Microscopic Examination  Result Value Ref Range   WBC, UA 0-5 0 - 5 /hpf   RBC, UA 0-2 0 - 2 /hpf   Epithelial Cells (non renal) 0-10 0 - 10 /hpf   Casts Present None seen /lpf   Cast Type Hyaline casts N/A   Bacteria, UA Many (A) None seen/Few  Urine Culture, Reflex  Result Value Ref Range   Urine Culture, Routine Final report    Organism ID, Bacteria Comment   CoaguChek XS/INR Waived  Result Value Ref Range   INR 2.8 (H) 0.9 - 1.1   Prothrombin Time 33.2 sec  UA/M w/rflx Culture, Routine  Result Value Ref Range   Specific Gravity, UA 1.015 1.005 - 1.030   pH, UA 6.5 5.0 - 7.5   Color, UA Yellow Yellow   Appearance Ur Hazy (A) Clear   Leukocytes, UA Trace (A) Negative   Protein, UA Negative Negative/Trace   Glucose, UA Trace (A) Negative   Ketones, UA Negative Negative   RBC, UA  Trace (A) Negative   Bilirubin, UA Negative Negative   Urobilinogen, Ur 1.0 0.2 - 1.0 mg/dL   Nitrite, UA Negative Negative   Microscopic Examination See below:    Urinalysis Reflex Comment       Assessment & Plan:   Problem List Items Addressed This Visit      Cardiovascular and Mediastinum   Benign essential hypertension    Not under great control at home. Will increase her ramipril to 15mg  daily and recheck in 1 month. Call with any concerns.       Relevant Medications   ramipril (ALTACE) 10 MG capsule   ramipril (ALTACE) 5 MG capsule   Paroxysmal A-fib (HCC) - Primary    Tolerating her coumadin well. Will get her hooked up with home INR monitoring due to the Coronavirus. Call with any  concerns. Continue to monitor. Will adjust her medicine as needed when labs done.       Relevant Medications   ramipril (ALTACE) 10 MG capsule   ramipril (ALTACE) 5 MG capsule   DVT, lower extremity, recurrent ([place])    Tolerating her coumadin well. Will get her hooked up with home INR monitoring due to the Coronavirus. Call with any concerns. Continue to monitor. Will adjust her medicine as needed when labs done.       Relevant Medications   ramipril (ALTACE) 10 MG capsule   ramipril (ALTACE) 5 MG capsule     Other   Anxiety, generalized    On last fill- will see [place] next month for evaluation.           Follow up plan: Return in about 4 weeks (around 07/23/2018) for DM, anxiety, INR.    Marland Kitchen This visit was completed via telephone due to the restrictions of the COVID-19 pandemic. All issues as above were discussed and addressed but no physical exam was performed. If it was felt that the patient should be evaluated in the office, they were directed there. The patient verbally consented to this visit. Patient was unable to complete an audio/visual visit due to Lack of equipment. Due to the catastrophic nature of the COVID-19 pandemic, this visit was done through audio contact only. . Location of the patient: home . Location of the provider: work . Those involved with this call:  . Provider: Park Liter, DO . CMA: Tiffany Reel, CMA . Front Desk/Registration: Jill Side  . Time spent on call: 22 minutes on the phone discussing health concerns

## 2018-06-25 NOTE — Assessment & Plan Note (Signed)
Not under great control at home. Will increase her ramipril to 15mg  daily and recheck in 1 month. Call with any concerns.

## 2018-06-25 NOTE — Telephone Encounter (Signed)
Pt called in to reschedule cpe. Her concern is that she suffers with blood clots and her medications. I asked her about possible video visits she wouldn't be able to do that however she is open to doing a telephone visit. Please advise.

## 2018-06-25 NOTE — Assessment & Plan Note (Signed)
Tolerating her coumadin well. Will get her hooked up with home INR monitoring due to the Coronavirus. Call with any concerns. Continue to monitor. Will adjust her medicine as needed when labs done.

## 2018-06-25 NOTE — Assessment & Plan Note (Signed)
On last fill- will see [place] next month for evaluation.

## 2018-06-25 NOTE — Telephone Encounter (Signed)
Seen today. 

## 2018-07-14 LAB — PROTIME-INR

## 2018-07-19 ENCOUNTER — Other Ambulatory Visit: Payer: Self-pay

## 2018-07-19 ENCOUNTER — Ambulatory Visit: Payer: Medicare HMO | Admitting: Family Medicine

## 2018-07-21 ENCOUNTER — Encounter: Payer: Self-pay | Admitting: Family Medicine

## 2018-07-21 ENCOUNTER — Other Ambulatory Visit: Payer: Self-pay

## 2018-07-21 ENCOUNTER — Ambulatory Visit (INDEPENDENT_AMBULATORY_CARE_PROVIDER_SITE_OTHER): Payer: Medicare HMO | Admitting: Family Medicine

## 2018-07-21 VITALS — BP 120/62 | HR 79

## 2018-07-21 DIAGNOSIS — IMO0001 Reserved for inherently not codable concepts without codable children: Secondary | ICD-10-CM

## 2018-07-21 DIAGNOSIS — F322 Major depressive disorder, single episode, severe without psychotic features: Secondary | ICD-10-CM

## 2018-07-21 DIAGNOSIS — F411 Generalized anxiety disorder: Secondary | ICD-10-CM

## 2018-07-21 DIAGNOSIS — I480 Paroxysmal atrial fibrillation: Secondary | ICD-10-CM | POA: Diagnosis not present

## 2018-07-21 DIAGNOSIS — I10 Essential (primary) hypertension: Secondary | ICD-10-CM

## 2018-07-21 DIAGNOSIS — F5101 Primary insomnia: Secondary | ICD-10-CM

## 2018-07-21 DIAGNOSIS — E782 Mixed hyperlipidemia: Secondary | ICD-10-CM | POA: Diagnosis not present

## 2018-07-21 DIAGNOSIS — E1165 Type 2 diabetes mellitus with hyperglycemia: Secondary | ICD-10-CM | POA: Diagnosis not present

## 2018-07-21 MED ORDER — GLIPIZIDE ER 10 MG PO TB24: 10.0000 mg | ORAL_TABLET | Freq: Every day | ORAL | 1 refills | Status: DC

## 2018-07-21 MED ORDER — FLUTICASONE PROPIONATE 50 MCG/ACT NA SUSP: 2.0000 | Freq: Every day | NASAL | 12 refills | Status: DC

## 2018-07-21 MED ORDER — LORAZEPAM 1 MG PO TABS: 1.0000 mg | ORAL_TABLET | Freq: Every evening | ORAL | 0 refills | Status: AC | PRN

## 2018-07-21 MED ORDER — AMLODIPINE BESYLATE 10 MG PO TABS: 10.0000 mg | ORAL_TABLET | Freq: Every day | ORAL | 3 refills | Status: DC

## 2018-07-21 MED ORDER — LORAZEPAM 1 MG PO TABS: 1.0000 mg | ORAL_TABLET | Freq: Two times a day (BID) | ORAL | 0 refills | Status: DC | PRN

## 2018-07-21 MED ORDER — LORAZEPAM 1 MG PO TABS: 1.0000 mg | ORAL_TABLET | Freq: Two times a day (BID) | ORAL | 0 refills | Status: AC | PRN

## 2018-07-21 NOTE — Assessment & Plan Note (Signed)
Under good control with INR of 2.4. Continue current regimen. Continue to monitor. Call with any concerns.

## 2018-07-21 NOTE — Progress Notes (Signed)
BP 120/62   Pulse 79   LMP  (LMP Unknown)    Subjective:    Patient ID: Daisy Watkins, female    DOB: 10/26/1932, 85 y.o.   MRN: [id number]  HPI: Daisy Watkins is a 85 y.o. female  Chief Complaint  Patient presents with  . Anxiety  . Anticoagulation  . Diabetes  . Hypertension  . Hyperlipidemia   Coumadin Management.  The expected duration of coumadin treatment is lifelong The reason for anticoagulation is  A. Fib.  Present Coumadin dose: Goal: 2.0-3.0  Excessive bruising: no Nose bleeding: no Rectal bleeding: no Prolonged menstrual cycles: N/A Eating diet with consistent amounts of foods containing Vitamin K:yes Any recent antibiotic use? no  DIABETES Hypoglycemic episodes:no Polydipsia/polyuria: no Visual disturbance: no Chest pain: no Paresthesias: no Glucose Monitoring: yes  Accucheck frequency: Daily  Fasting glucose: 88 today Taking Insulin?: yes Blood Pressure Monitoring: not checking Retinal Examination: Not up to Date Foot Exam: Up to Date Diabetic Education: Completed Pneumovax: Not up to Date Influenza: Not up to Date Aspirin: no  HYPERTENSION / [place] Satisfied with current treatment? yes Duration of hypertension: chronic BP monitoring frequency: rarely BP medication side effects: no Past BP meds: ramipril, amlodipine Duration of hyperlipidemia: chronic Cholesterol medication side effects: not on anything Cholesterol supplements: none Medication compliance: excellent compliance Aspirin: no Recent stressors: yes Recurrent headaches: no Visual changes: no Palpitations: no Dyspnea: no Chest pain: no Lower extremity edema: no Dizzy/lightheaded: no  ANXIETY/STRESS Duration:stable Anxious mood: yes  Excessive worrying: no Irritability: no  Sweating: no Nausea: no Palpitations:no Hyperventilation: no Panic attacks: no Agoraphobia: no  Obscessions/compulsions: no Depressed mood: no Depression screen [facility] 2/9 07/21/2018  06/25/2018 01/20/2018 10/26/2017 10/26/2017  Decreased Interest 0 0 0 0 0  Down, Depressed, Hopeless 1 0 0 0 0  PHQ - 2 Score 1 0 0 0 0  Altered sleeping 3 - 3 3 3   Tired, decreased energy 0 - 3 3 3   Change in appetite 1 - 3 2 2   Feeling bad or failure about yourself  0 - 0 0 0  Trouble concentrating 0 - 0 0 0  Moving slowly or fidgety/restless 0 - 3 0 0  Suicidal thoughts 0 - 3 0 0  PHQ-9 Score 5 - 15 8 8   Difficult doing work/chores Not difficult at all - - Somewhat difficult Somewhat difficult  Some recent data might be hidden   GAD 7 : Generalized Anxiety Score 07/21/2018 10/26/2017 10/26/2017  Nervous, Anxious, on Edge 3 0 0  Control/stop worrying 0 1 1  Worry too much - different things 1 1 1   Trouble relaxing 0 1 1  Restless 0 0 0  Easily annoyed or irritable 0 0 0  Afraid - awful might happen 3 0 0  Total GAD 7 Score 7 3 3   Anxiety Difficulty Not difficult at all Somewhat difficult -   Anhedonia: no Weight changes: no Insomnia: yes hard to fall asleep  Hypersomnia: no Fatigue/loss of energy: no Feelings of worthlessness: no Feelings of guilt: no Impaired concentration/indecisiveness: no Suicidal ideations: no  Crying spells: no Recent Stressors/Life Changes: yes   Relationship problems: no   Family stress: no     Financial stress: no    Job stress: no    Recent death/loss: no   Relevant past medical, surgical, family and social history reviewed and updated as indicated. Interim medical history since our last visit reviewed. Allergies and medications reviewed and updated.  Review of  Systems  Constitutional: Negative.   Respiratory: Negative.   Cardiovascular: Negative.   Gastrointestinal: Negative.   Musculoskeletal: Negative.   Neurological: Negative.   Psychiatric/Behavioral: Negative.     Per HPI unless specifically indicated above     Objective:    BP 120/62   Pulse 79   LMP  (LMP Unknown)   Wt Readings from Last 3 Encounters:  06/25/18 209 lb  (94.8 kg)  05/28/18 205 lb (93 kg)  04/30/18 204 lb (92.5 kg)    Physical Exam Vitals signs and nursing note reviewed.  Pulmonary:     Effort: Pulmonary effort is normal. No respiratory distress.     Comments: Speaking in full sentences Neurological:     Mental Status: She is alert.  Psychiatric:        Mood and Affect: Mood normal.        Behavior: Behavior normal.        Thought Content: Thought content normal.        Judgment: Judgment normal.     Results for orders placed or performed in visit on 05/28/18  Microscopic Examination  Result Value Ref Range   WBC, UA 0-5 0 - 5 /hpf   RBC, UA 0-2 0 - 2 /hpf   Epithelial Cells (non renal) 0-10 0 - 10 /hpf   Casts Present None seen /lpf   Cast Type Hyaline casts N/A   Bacteria, UA Many (A) None seen/Few  Urine Culture, Reflex  Result Value Ref Range   Urine Culture, Routine Final report    Organism ID, Bacteria Comment   CoaguChek XS/INR Waived  Result Value Ref Range   INR 2.8 (H) 0.9 - 1.1   Prothrombin Time 33.2 sec  UA/M w/rflx Culture, Routine  Result Value Ref Range   Specific Gravity, UA 1.015 1.005 - 1.030   pH, UA 6.5 5.0 - 7.5   Color, UA Yellow Yellow   Appearance Ur Hazy (A) Clear   Leukocytes, UA Trace (A) Negative   Protein, UA Negative Negative/Trace   Glucose, UA Trace (A) Negative   Ketones, UA Negative Negative   RBC, UA Trace (A) Negative   Bilirubin, UA Negative Negative   Urobilinogen, Ur 1.0 0.2 - 1.0 mg/dL   Nitrite, UA Negative Negative   Microscopic Examination See below:    Urinalysis Reflex Comment       Assessment & Plan:   Problem List Items Addressed This Visit      Cardiovascular and Mediastinum   Benign essential hypertension - Primary    Under good control on current regimen. Continue current regimen. Continue to monitor. Call with any concerns. Refills given. Will hold on labs for a couple of months given COVID-19 pandemic.        Relevant Medications   amLODipine  (NORVASC) 10 MG tablet   Paroxysmal A-fib (HCC)    Under good control with INR of 2.4. Continue current regimen. Continue to monitor. Call with any concerns.       Relevant Medications   amLODipine (NORVASC) 10 MG tablet     Endocrine   Diabetes mellitus type 2, uncontrolled, without complications (HCC) (Chronic)    Due for recheck on her A1c, due to the COVID-19 pandemic, we will hold on blood work for 1-2 months. Continue current regimen. Continue to monitor. Call with any concerns. Refills given.       Relevant Medications   glipiZIDE (GLIPIZIDE XL) 10 MG 24 hr tablet     Other   Hyperlipidemia,  mixed    Under good control on current regimen. Continue current regimen. Continue to monitor. Call with any concerns. Risks of statin outweigh the benefit on this patient. Will hold on labs for a couple of months given COVID-19 pandemic.       Relevant Medications   amLODipine (NORVASC) 10 MG tablet   Anxiety, generalized    Stable on current regimen. Continue current regimen. Refills of lorazepam for 3 months given today. Call with any concerns.       Relevant Medications   LORazepam (ATIVAN) 1 MG tablet   LORazepam (ATIVAN) 1 MG tablet (Start on 08/20/2018)   LORazepam (ATIVAN) 1 MG tablet (Start on 09/19/2018)   Major depression    Under good control on current regimen. Continue current regimen. Continue to monitor. Call with any concerns. Refills given.        Relevant Medications   LORazepam (ATIVAN) 1 MG tablet   LORazepam (ATIVAN) 1 MG tablet (Start on 08/20/2018)   LORazepam (ATIVAN) 1 MG tablet (Start on 09/19/2018)   Insomnia    Stable on current regimen. Continue current regimen. Refills of lorazepam for 3 months given today. Call with any concerns.           Follow up plan: Return in about 4 weeks (around 08/18/2018) for INR, 3 months Physical/Wellness.   . This visit was completed via telephone due to the restrictions of the COVID-19 pandemic. All issues as above  were discussed and addressed but no physical exam was performed. If it was felt that the patient should be evaluated in the office, they were directed there. The patient verbally consented to this visit. Patient was unable to complete an audio/visual visit due to Lack of equipment. Due to the catastrophic nature of the COVID-19 pandemic, this visit was done through audio contact only. . Location of the patient: home . Location of the provider: home . Those involved with this call:  . Provider: Park Liter, DO . CMA: Tiffany Reel, CMA . Front Desk/Registration: Jill Side  . Time spent on call: 25 minutes on the phone discussing health concerns. 40 minutes total spent in review of patient's record and preparation of their chart.

## 2018-07-21 NOTE — Assessment & Plan Note (Signed)
Under good control on current regimen. Continue current regimen. Continue to monitor. Call with any concerns. Refills given. Will hold on labs for a couple of months given COVID-19 pandemic.

## 2018-07-21 NOTE — Assessment & Plan Note (Addendum)
Under good control on current regimen. Continue current regimen. Continue to monitor. Call with any concerns. Risks of statin outweigh the benefit on this patient. Will hold on labs for a couple of months given COVID-19 pandemic.

## 2018-07-21 NOTE — Assessment & Plan Note (Signed)
Due for recheck on her A1c, due to the COVID-19 pandemic, we will hold on blood work for 1-2 months. Continue current regimen. Continue to monitor. Call with any concerns. Refills given.

## 2018-07-21 NOTE — Assessment & Plan Note (Signed)
Stable on current regimen. Continue current regimen. Refills of lorazepam for 3 months given today. Call with any concerns.

## 2018-07-21 NOTE — Assessment & Plan Note (Signed)
Under good control on current regimen. Continue current regimen. Continue to monitor. Call with any concerns. Refills given.   

## 2018-07-22 DIAGNOSIS — I480 Paroxysmal atrial fibrillation: Secondary | ICD-10-CM | POA: Diagnosis not present

## 2018-07-22 DIAGNOSIS — Z7901 Long term (current) use of anticoagulants: Secondary | ICD-10-CM | POA: Diagnosis not present

## 2018-07-22 LAB — PROTIME-INR

## 2018-07-27 ENCOUNTER — Other Ambulatory Visit: Payer: Self-pay | Admitting: Unknown Physician Specialty

## 2018-08-02 ENCOUNTER — Other Ambulatory Visit: Payer: Self-pay

## 2018-08-02 NOTE — Telephone Encounter (Signed)
Fax from [facility] requesting Novolin insulin 70/30 human refill.

## 2018-08-05 ENCOUNTER — Other Ambulatory Visit: Payer: Self-pay

## 2018-08-05 MED ORDER — INSULIN NPH ISOPHANE & REGULAR (70-30) 100 UNIT/ML ~~LOC~~ SUSP: SUBCUTANEOUS | 3 refills | Status: DC

## 2018-08-05 NOTE — Telephone Encounter (Signed)
Patient last seen 07/21/18 and has appointment 10/21/18.

## 2018-08-12 ENCOUNTER — Encounter: Payer: Self-pay | Admitting: Family Medicine

## 2018-08-12 LAB — POCT INR: INR: 1.8 — AB (ref 2.0–3.0)

## 2018-08-18 ENCOUNTER — Telehealth: Payer: Self-pay | Admitting: Family Medicine

## 2018-08-18 NOTE — Telephone Encounter (Signed)
Called pt to schedule virtual visit, no answer, left voicemail.

## 2018-08-19 ENCOUNTER — Other Ambulatory Visit: Payer: Self-pay | Admitting: Family Medicine

## 2018-08-19 ENCOUNTER — Ambulatory Visit (INDEPENDENT_AMBULATORY_CARE_PROVIDER_SITE_OTHER): Payer: Medicare HMO | Admitting: Family Medicine

## 2018-08-19 ENCOUNTER — Other Ambulatory Visit: Payer: Self-pay

## 2018-08-19 ENCOUNTER — Telehealth: Payer: Self-pay | Admitting: Family Medicine

## 2018-08-19 ENCOUNTER — Encounter: Payer: Self-pay | Admitting: Family Medicine

## 2018-08-19 ENCOUNTER — Ambulatory Visit: Payer: Medicare HMO | Admitting: Family Medicine

## 2018-08-19 VITALS — BP 147/76 | HR 60

## 2018-08-19 DIAGNOSIS — I480 Paroxysmal atrial fibrillation: Secondary | ICD-10-CM

## 2018-08-19 NOTE — Telephone Encounter (Signed)
Erroneous

## 2018-08-19 NOTE — Progress Notes (Signed)
   BP (!) 147/76   Pulse 60   LMP  (LMP Unknown)    Subjective:    Patient ID: Daisy Watkins, female    DOB: Jul 11, 1932, 85 y.o.   MRN: [id number]  CC: Coumadin management  HPI: This patient is a 85 y.o. female who presents for coumadin management. The expected duration of coumadin treatment is lifelong The reason for anticoagulation is  A. Fib.  Present Coumadin dose: 7 mg daily (missed 1 day last week) Goal: 2.0-3.0  Excessive bruising: no Nose bleeding: no Rectal bleeding: no Prolonged menstrual cycles: N/A Eating diet with consistent amounts of foods containing Vitamin K:yes Any recent antibiotic use? yes  Relevant past medical, surgical, family and social history reviewed and updated as indicated. Interim medical history since our last visit reviewed. Allergies and medications reviewed and updated.  ROS: Per HPI unless specifically indicated above     Objective:    BP (!) 147/76   Pulse 60   LMP  (LMP Unknown)   Wt Readings from Last 3 Encounters:  06/25/18 209 lb (94.8 kg)  05/28/18 205 lb (93 kg)  04/30/18 204 lb (92.5 kg)     General: Well appearing, well nourished in no distress.  Normal mood and affect. Skin: No excessive bruising or rash  Last INR: 1.8    Last CBC:  Lab Results  Component Value Date   WBC 6.4 10/26/2017   HGB 13.6 10/26/2017   HCT 41.6 10/26/2017   MCV 88 10/26/2017   PLT 256 10/26/2017    Results for orders placed or performed in visit on 07/22/18  Protime-INR  Result Value Ref Range   INR         Assessment:     ICD-10-CM   1. Paroxysmal A-fib ([place]) I48.0     Plan:   Discussed current plan face-to-face with patient. For coumadin dosing, elected to continue current dose. Will plan to recheck INR in 1 month.   . This visit was completed via telephone due to the restrictions of the COVID-19 pandemic. All issues as above were discussed and addressed but no physical exam was performed. If it was felt that the patient  should be evaluated in the office, they were directed there. The patient verbally consented to this visit. Patient was unable to complete an audio/visual visit due to Lack of equipment. Due to the catastrophic nature of the COVID-19 pandemic, this visit was done through audio contact only. . Location of the patient: home . Location of the provider: home . Those involved with this call:  . Provider: Park Liter, DO . CMA: Tiffany Reel, CMA . Front Desk/Registration: Don Perking  . Time spent on call: 15 minutes on the phone discussing health concerns. 20 minutes total spent in review of patient's record and preparation of their chart.

## 2018-08-19 NOTE — Telephone Encounter (Signed)
She is not due until tomorrow. She should have an RX that should be ready at the pharmacy tomorrow that has already been sent in. Thanks.

## 2018-08-19 NOTE — Telephone Encounter (Signed)
Called and spoke with the pharmacy, they did not have the two post dated prescriptions, so I verbally called them in.

## 2018-08-19 NOTE — Telephone Encounter (Signed)
Requested medication (s) are due for refill today: Yes  Requested medication (s) are on the active medication list: Yes  Last refill:  07/21/18  Future visit scheduled: Yes  Notes to clinic:  Unable to delegate, cannot refill     Requested Prescriptions  Pending Prescriptions Disp Refills   LORazepam (ATIVAN) 1 MG tablet [Pharmacy Med Name: LORAZEPAM 1MG  TABLETS] 30 tablet     Sig: TAKE 1 TABLET(1 MG) BY MOUTH AT BEDTIME AS NEEDED FOR ANXIETY     Not Delegated - Psychiatry:  Anxiolytics/Hypnotics Failed - 08/19/2018  1:46 PM      Failed - This refill cannot be delegated      Failed - Urine Drug Screen completed in last 360 days.      Passed - Valid encounter within last 6 months    Recent Outpatient Visits          Today Paroxysmal A-fib [facility])   [place], Megan P, DO   4 weeks ago Benign essential hypertension   [place], [place], DO   1 month ago Paroxysmal A-fib [facility])   [place], Megan P, DO   2 months ago Paroxysmal A-fib [facility])   [place], Megan P, DO   3 months ago Benign essential hypertension   [place], Barb Merino, DO      Future Appointments            In 2 months  [place], [place]   In 2 months [place], Megan P, DO [employer]

## 2018-08-26 ENCOUNTER — Ambulatory Visit: Payer: Medicare HMO | Admitting: Family Medicine

## 2018-09-15 ENCOUNTER — Encounter: Payer: Self-pay | Admitting: Family Medicine

## 2018-09-15 LAB — PROTIME-INR: INR: 3.1 — AB (ref 0.9–1.1)

## 2018-09-23 ENCOUNTER — Encounter: Payer: Self-pay | Admitting: Family Medicine

## 2018-09-23 LAB — PROTIME-INR: INR: 3.4 — AB (ref 0.9–1.1)

## 2018-09-27 ENCOUNTER — Ambulatory Visit: Payer: Medicare HMO | Admitting: Family Medicine

## 2018-09-30 DIAGNOSIS — Z7901 Long term (current) use of anticoagulants: Secondary | ICD-10-CM | POA: Diagnosis not present

## 2018-09-30 DIAGNOSIS — I480 Paroxysmal atrial fibrillation: Secondary | ICD-10-CM | POA: Diagnosis not present

## 2018-10-07 ENCOUNTER — Ambulatory Visit (INDEPENDENT_AMBULATORY_CARE_PROVIDER_SITE_OTHER): Payer: Medicare HMO | Admitting: Nurse Practitioner

## 2018-10-07 ENCOUNTER — Encounter: Payer: Self-pay | Admitting: Family Medicine

## 2018-10-07 ENCOUNTER — Other Ambulatory Visit: Payer: Self-pay

## 2018-10-07 ENCOUNTER — Encounter: Payer: Self-pay | Admitting: Nurse Practitioner

## 2018-10-07 VITALS — Ht 63.0 in | Wt 200.0 lb

## 2018-10-07 DIAGNOSIS — H5789 Other specified disorders of eye and adnexa: Secondary | ICD-10-CM | POA: Diagnosis not present

## 2018-10-07 DIAGNOSIS — R300 Dysuria: Secondary | ICD-10-CM | POA: Diagnosis not present

## 2018-10-07 LAB — PROTIME-INR: INR: 2.5 — AB (ref 0.9–1.1)

## 2018-10-07 MED ORDER — HYDROCORTISONE-ACETIC ACID 1-2 % OT SOLN: 3.0000 [drp] | Freq: Two times a day (BID) | OTIC | 0 refills | Status: DC

## 2018-10-07 NOTE — Assessment & Plan Note (Signed)
Acute for 2-3 weeks is scheduled for visit tomorrow in office and will obtain urine and possible wet prep at that time.  Unable to fully assess via current visit method.

## 2018-10-07 NOTE — Progress Notes (Signed)
Ht 5\' 3"  (1.6 m)   Wt 200 lb (90.7 kg)   LMP  (LMP Unknown)   BMI 35.43 kg/m    Subjective:    Patient ID: Daisy Watkins, female    DOB: Nov 17, 1932, 85 y.o.   MRN: [id number]  HPI: Daisy Watkins is a 85 y.o. female  Chief Complaint  Patient presents with  . Eye Drainage    Bilateral. Ongoing 1 week  . Conjunctivitis    Left Eye  . Itchy Eye  . Urinary Frequency    Ongoing 2 weeks.  . Vaginal Itching    . This visit was completed via telephone due to the restrictions of the COVID-19 pandemic. All issues as above were discussed and addressed but no physical exam was performed. If it was felt that the patient should be evaluated in the office, they were directed there. The patient verbally consented to this visit. Patient was unable to complete an audio/visual visit due to Technical difficulties,Lack of internet. Due to the catastrophic nature of the COVID-19 pandemic, this visit was done through audio contact only. . Location of the patient: home . Location of the provider: home . Those involved with this call:  . Provider: Marnee Guarneri, DNP . CMA: Merilyn Baba, CMA . Front Desk/Registration: Jill Side  . Time spent on call: 15 minutes on the phone discussing health concerns. 10 minutes total spent in review of patient's record and preparation of their chart. I verified patient identity using two factors (patient name and date of birth). Patient consents verbally to being seen via telemedicine visit today.   EYE REDNESS Has underlying allergies and uses Flonase but not often, does not use any OTC allergy oral meds.  Uses Vosol-HC eye drops BID, has run out about a month or two ago.  She reports this helped her eyes.  Currently has redness to left eye and her ears are itching.  Ear itching has been ongoing issue for several months she states and has been treated.  Eye redness started over 4th of July.  Has drainage to both eyes at times, states they are tears "like you are  crying". Denies any thick drainage or crusting.  Denies vision changes. Duration:  days Involved eye:  bilateral Onset: gradual Severity: mild  Foreign body sensation:no Visual impairment: no Eye redness: yes Discharge: yes Crusting or matting of eyelids: no Swelling: no Photophobia: no Itching: yes Tearing: yes Headache: no Floaters: no URI symptoms: no Contact lens use: no Close contacts with similar problems: no Eye trauma: no  URINARY SYMPTOMS WITH VAGINAL ITCHING Has underlying T2DM, but had not been taking Metformin due to it being "too many pills".  Is taking insulin NPH 70/30 & Glipizide with last A1C 7.6% in January.  Had similar symptoms in January with no growth found, was initially treated with Cipro.  Has had burning for 2-3 weeks she reports with vaginal itching but no discharge. Dysuria: yes Urinary frequency: yes Urgency: yes Small volume voids: no Symptom severity: yes Urinary incontinence: no Foul odor: yes Hematuria: no Abdominal pain: yes Back pain: yes Suprapubic pain/pressure: yes Flank pain: no Fever:  no Vomiting: no Relief with cranberry juice: no Relief with pyridium: no Status: worse Previous urinary tract infection: yes Recurrent urinary tract infection: no Sexual activity: Not sexually active History of sexually transmitted disease: no Treatments attempted: increasing fluids   Relevant past medical, surgical, family and social history reviewed and updated as indicated. Interim medical history since our last visit  reviewed. Allergies and medications reviewed and updated.  Review of Systems  Constitutional: Negative for activity change, appetite change, diaphoresis, fatigue and fever.  HENT: Negative for ear discharge and ear pain.   Eyes: Positive for discharge (tears), redness and itching. Negative for photophobia, pain and visual disturbance.  Respiratory: Negative for cough, chest tightness and shortness of breath.   Cardiovascular:  Negative for chest pain, palpitations and leg swelling.  Gastrointestinal: Negative for abdominal distention, abdominal pain, constipation, diarrhea, nausea and vomiting.  Endocrine: Negative for cold intolerance, heat intolerance, polydipsia, polyphagia and polyuria.  Genitourinary: Positive for dysuria, frequency and urgency. Negative for hematuria and pelvic pain.  Neurological: Negative for dizziness, syncope, weakness, light-headedness, numbness and headaches.  Psychiatric/Behavioral: Negative.     Per HPI unless specifically indicated above     Objective:    Ht 5\' 3"  (1.6 m)   Wt 200 lb (90.7 kg)   LMP  (LMP Unknown)   BMI 35.43 kg/m   Wt Readings from Last 3 Encounters:  10/07/18 200 lb (90.7 kg)  06/25/18 209 lb (94.8 kg)  05/28/18 205 lb (93 kg)    Physical Exam   Unable to perform due to telephone visit only, patient reports her phone would not allow camera access for D[employer].  Results for orders placed or performed in visit on 09/23/18  Protime-INR  Result Value Ref Range   INR 3.4 (A) 0.9 - 1.1      Assessment & Plan:   Problem List Items Addressed This Visit      Other   Dysuria    Acute for 2-3 weeks is scheduled for visit tomorrow in office and will obtain urine and possible wet prep at that time.  Unable to fully assess via current visit method.        Eye redness - Primary    Suspect related to allergies.  Sent in refill on eye drops, but have recommended face to face assessment as unable to evaluate eyes via current visit method.  Has appointment tomorrow.         I discussed the assessment and treatment plan with the patient. The patient was provided an opportunity to ask questions and all were answered. The patient agreed with the plan and demonstrated an understanding of the instructions.   The patient was advised to call back or seek an in-person evaluation if the symptoms worsen or if the condition fails to improve as anticipated.   I  provided 15 minutes of time during this encounter.  Follow up plan: Return in about 1 day (around 10/08/2018) for eye redness and dysuria.

## 2018-10-07 NOTE — Patient Instructions (Signed)
Urinary Tract Infection, Adult A urinary tract infection (UTI) is an infection of any part of the urinary tract. The urinary tract includes:  The kidneys.  The ureters.  The bladder.  The urethra. These organs make, store, and get rid of pee (urine) in the body. What are the causes? This is caused by germs (bacteria) in your genital area. These germs grow and cause swelling (inflammation) of your urinary tract. What increases the risk? You are more likely to develop this condition if:  You have a small, thin tube (catheter) to drain pee.  You cannot control when you pee or poop (incontinence).  You are female, and: ? You use these methods to prevent pregnancy: ? A medicine that kills sperm (spermicide). ? A device that blocks sperm (diaphragm). ? You have low levels of a female hormone (estrogen). ? You are pregnant.  You have genes that add to your risk.  You are sexually active.  You take antibiotic medicines.  You have trouble peeing because of: ? A prostate that is bigger than normal, if you are female. ? A blockage in the part of your body that drains pee from the bladder (urethra). ? A kidney stone. ? A nerve condition that affects your bladder (neurogenic bladder). ? Not getting enough to drink. ? Not peeing often enough.  You have other conditions, such as: ? Diabetes. ? A weak disease-fighting system (immune system). ? Sickle cell disease. ? Gout. ? Injury of the spine. What are the signs or symptoms? Symptoms of this condition include:  Needing to pee right away (urgently).  Peeing often.  Peeing small amounts often.  Pain or burning when peeing.  Blood in the pee.  Pee that smells bad or not like normal.  Trouble peeing.  Pee that is cloudy.  Fluid coming from the vagina, if you are female.  Pain in the belly or lower back. Other symptoms include:  Throwing up (vomiting).  No urge to eat.  Feeling mixed up (confused).  Being tired  and grouchy (irritable).  A fever.  Watery poop (diarrhea). How is this treated? This condition may be treated with:  Antibiotic medicine.  Other medicines.  Drinking enough water. Follow these instructions at home:  Medicines  Take over-the-counter and prescription medicines only as told by your doctor.  If you were prescribed an antibiotic medicine, take it as told by your doctor. Do not stop taking it even if you start to feel better. General instructions  Make sure you: ? Pee until your bladder is empty. ? Do not hold pee for a long time. ? Empty your bladder after sex. ? Wipe from front to back after pooping if you are a female. Use each tissue one time when you wipe.  Drink enough fluid to keep your pee pale yellow.  Keep all follow-up visits as told by your doctor. This is important. Contact a doctor if:  You do not get better after 1-2 days.  Your symptoms go away and then come back. Get help right away if:  You have very bad back pain.  You have very bad pain in your lower belly.  You have a fever.  You are sick to your stomach (nauseous).  You are throwing up. Summary  A urinary tract infection (UTI) is an infection of any part of the urinary tract.  This condition is caused by germs in your genital area.  There are many risk factors for a UTI. These include having a small, thin   tube to drain pee and not being able to control when you pee or poop.  Treatment includes antibiotic medicines for germs.  Drink enough fluid to keep your pee pale yellow. This information is not intended to replace advice given to you by your health care provider. Make sure you discuss any questions you have with your health care provider. Document Released: 09/03/2007 Document Revised: 03/04/2018 Document Reviewed: 09/24/2017 Elsevier Patient Education  2020 [employer]  

## 2018-10-07 NOTE — Assessment & Plan Note (Signed)
Suspect related to allergies.  Sent in refill on eye drops, but have recommended face to face assessment as unable to evaluate eyes via current visit method.  Has appointment tomorrow.

## 2018-10-08 ENCOUNTER — Ambulatory Visit (INDEPENDENT_AMBULATORY_CARE_PROVIDER_SITE_OTHER): Payer: Medicare HMO | Admitting: Family Medicine

## 2018-10-08 ENCOUNTER — Other Ambulatory Visit: Payer: Self-pay

## 2018-10-08 ENCOUNTER — Encounter: Payer: Self-pay | Admitting: Family Medicine

## 2018-10-08 VITALS — BP 191/88 | HR 57 | Temp 98.6°F | Ht 63.0 in | Wt 203.0 lb

## 2018-10-08 DIAGNOSIS — N898 Other specified noninflammatory disorders of vagina: Secondary | ICD-10-CM

## 2018-10-08 DIAGNOSIS — H5789 Other specified disorders of eye and adnexa: Secondary | ICD-10-CM

## 2018-10-08 DIAGNOSIS — R350 Frequency of micturition: Secondary | ICD-10-CM

## 2018-10-08 LAB — UA/M W/RFLX CULTURE, ROUTINE
Bilirubin, UA: NEGATIVE
Leukocytes,UA: NEGATIVE
Nitrite, UA: NEGATIVE
Protein,UA: NEGATIVE
RBC, UA: NEGATIVE
Specific Gravity, UA: 1.025 (ref 1.005–1.030)
Urobilinogen, Ur: 1 mg/dL (ref 0.2–1.0)
pH, UA: 5.5 (ref 5.0–7.5)

## 2018-10-08 LAB — WET PREP FOR TRICH, YEAST, CLUE
Clue Cell Exam: NEGATIVE
Trichomonas Exam: NEGATIVE
Yeast Exam: NEGATIVE

## 2018-10-08 MED ORDER — AZELASTINE HCL 0.05 % OP SOLN: 1.0000 [drp] | Freq: Two times a day (BID) | OPHTHALMIC | 12 refills | Status: DC

## 2018-10-08 MED ORDER — ERYTHROMYCIN 5 MG/GM OP OINT: 1.0000 "application " | TOPICAL_OINTMENT | Freq: Two times a day (BID) | OPHTHALMIC | 0 refills | Status: DC | PRN

## 2018-10-08 NOTE — Progress Notes (Signed)
BP (!) 191/88   Pulse (!) 57   Temp 98.6 F (37 C) (Oral)   Ht 5\' 3"  (1.6 m)   Wt 203 lb (92.1 kg)   LMP  (LMP Unknown)   SpO2 96%   BMI 35.96 kg/m    Subjective:    Patient ID: Daisy Watkins, female    DOB: December 29, 1932, 85 y.o.   MRN: [id number]  HPI: Daisy Watkins is a 85 y.o. female  Chief Complaint  Patient presents with  . Conjunctivitis    bilateral x about a week. burns and itchy. sore  . Urinary Frequency    burning for about 2-3 weeks ago   Patient presenting today for redness, itching and watering eyes b/l for about a week now. Has known cataracts so her vision is not good at baseline, does not feel this has worsened that. Has tried OTC drops with no relief. Denies congestion, fevers, headaches, temple pain, eye pressure or pain, sore throat, cough, or other sick sxs.   Also having urinary frequency, dysuria and vaginal irritation for about 2-3 weeks now off and on. Denies vaginal discharge, hematuria, concern for STIs, abdominal pain.   Relevant past medical, surgical, family and social history reviewed and updated as indicated. Interim medical history since our last visit reviewed. Allergies and medications reviewed and updated.  Review of Systems  Per HPI unless specifically indicated above     Objective:    BP (!) 191/88   Pulse (!) 57   Temp 98.6 F (37 C) (Oral)   Ht 5\' 3"  (1.6 m)   Wt 203 lb (92.1 kg)   LMP  (LMP Unknown)   SpO2 96%   BMI 35.96 kg/m   Wt Readings from Last 3 Encounters:  10/08/18 203 lb (92.1 kg)  10/07/18 200 lb (90.7 kg)  06/25/18 209 lb (94.8 kg)    Physical Exam Vitals signs and nursing note reviewed.  Constitutional:      Appearance: Normal appearance. She is not ill-appearing.  HENT:     Head: Atraumatic.  Eyes:     General:        Right eye: Discharge present.        Left eye: Discharge present.    Extraocular Movements: Extraocular movements intact.     Comments: Conjunctiva mildly erythematous b/l  Neck:   Musculoskeletal: Normal range of motion and neck supple.  Cardiovascular:     Rate and Rhythm: Normal rate and regular rhythm.     Heart sounds: Normal heart sounds.  Pulmonary:     Effort: Pulmonary effort is normal.     Breath sounds: Normal breath sounds.  Abdominal:     General: Bowel sounds are normal.     Palpations: Abdomen is soft.     Tenderness: There is no abdominal tenderness. There is no right CVA tenderness, left CVA tenderness or guarding.  Musculoskeletal: Normal range of motion.  Skin:    General: Skin is warm and dry.  Neurological:     Mental Status: She is alert and oriented to person, place, and time.  Psychiatric:        Mood and Affect: Mood normal.        Thought Content: Thought content normal.        Judgment: Judgment normal.    Visual Acuity: without correction (states she used to have glasses but her eye doctor took them from her) R-20/100 L-20/70 Both-20/100  Results for orders placed or performed in visit on 10/08/18  WET PREP FOR TRICH, YEAST, CLUE   Specimen: Vaginal Fluid  Result Value Ref Range   Trichomonas Exam Negative Negative   Yeast Exam Negative Negative   Clue Cell Exam Negative Negative  UA/M w/rflx Culture, Routine   Specimen: Urine   URINE  Result Value Ref Range   Specific Gravity, UA 1.025 1.005 - 1.030   pH, UA 5.5 5.0 - 7.5   Color, UA Yellow Yellow   Appearance Ur Clear Clear   Leukocytes,UA Negative Negative   Protein,UA Negative Negative/Trace   Glucose, UA 2+ (A) Negative   Ketones, UA Trace (A) Negative   RBC, UA Negative Negative   Bilirubin, UA Negative Negative   Urobilinogen, Ur 1.0 0.2 - 1.0 mg/dL   Nitrite, UA Negative Negative      Assessment & Plan:   Problem List Items Addressed This Visit    None    Visit Diagnoses    Redness of both eyes    -  Primary   Suspect largely allergic, tx with optivar drops and erythromycin ointment in case longstanding inflammation leading to bacterial infection    Urinary frequency       U/A benign, push fluids, probiotics, f/u for repeat U/A if sxs worsen or do not resolve   Relevant Orders   UA/M w/rflx Culture, Routine (Completed)   Vaginal itching       Wet prep neg, discsused continued good vaginal hygiene, start probiotics, call if sxs persisting for recheck   Relevant Orders   WET PREP FOR [place], YEAST, CLUE (Completed)       Follow up plan: Return for as scheduled.

## 2018-10-14 ENCOUNTER — Encounter: Payer: Self-pay | Admitting: Family Medicine

## 2018-10-14 LAB — POCT INR: INR: 3 (ref 2.0–3.0)

## 2018-10-15 ENCOUNTER — Other Ambulatory Visit: Payer: Self-pay | Admitting: Family Medicine

## 2018-10-15 NOTE — Telephone Encounter (Signed)
It looks like the prescription was for 1 tablet bid prn, if that is correct is she due for a refill?

## 2018-10-15 NOTE — Telephone Encounter (Signed)
Pt calling to check on the status of this.  States she is completely out and will need it before night today.

## 2018-10-15 NOTE — Telephone Encounter (Signed)
She is out 4 days early. Rx given on 09/19/18 for 30 pills. Should not be out until 10/19/18. Did she take extra of her medicine?

## 2018-10-15 NOTE — Telephone Encounter (Signed)
Requested medication (s) are due for refill today: yes  Requested medication (s) are on the active medication list:yes  Last refill:  09/19/2018  Future visit scheduled: yes  Notes to clinic:  Medication not delegated    Requested Prescriptions  Pending Prescriptions Disp Refills   LORazepam (ATIVAN) 1 MG tablet [Pharmacy Med Name: LORAZEPAM 1MG  TABLETS] 30 tablet     Sig: TAKE 1 TABLET BY MOUTH TWICE DAILY AS NEEDED     Not Delegated - Psychiatry:  Anxiolytics/Hypnotics Failed - 10/15/2018 11:12 AM      Failed - This refill cannot be delegated      Failed - Urine Drug Screen completed in last 360 days.      Passed - Valid encounter within last 6 months    Recent Outpatient Visits          1 week ago Redness of both eyes   [place], [place], [place]   1 week ago Eye redness   [facility] [place], [place] T, NP   1 month ago Paroxysmal A-fib [facility])   [place], [place], DO   2 months ago Benign essential hypertension   [place] P, DO   3 months ago Paroxysmal A-fib [facility])   [facility] Valerie Roys, DO      Future Appointments            In 6 days Wynetta Emery, Barb Merino, DO [facility]

## 2018-10-18 ENCOUNTER — Ambulatory Visit: Payer: Medicare HMO

## 2018-10-20 NOTE — Progress Notes (Signed)
BP (!) 168/81   Pulse (!) 39   Temp 98.3 F (36.8 C) (Oral)   Ht 5\' 2"  (1.575 m)   Wt 203 lb (92.1 kg)   LMP  (LMP Unknown)   SpO2 97%   BMI 37.13 kg/m    Subjective:    Patient ID: Daisy Watkins, female    DOB: 04/29/1932, 85 y.o.   MRN: [id number]  HPI: Daisy Watkins is a 85 y.o. female presenting on 10/21/2018 for comprehensive medical examination. Current medical complaints include:  Coumadin Management.  The expected duration of coumadin treatment is lifelong The reason for anticoagulation is  A. Fib.  Present Coumadin dose: 7mg  daily Goal: 2.0-3.0  Excessive bruising: no Nose bleeding: no Rectal bleeding: no Prolonged menstrual cycles: N/A Eating diet with consistent amounts of foods containing Vitamin K:yes Any recent antibiotic use? no  INSOMNIA Duration: chronic Satisfied with sleep quality: yes Difficulty falling asleep: no Difficulty staying asleep: no Waking a few hours after sleep onset: no Early morning awakenings: no Daytime hypersomnolence: no Wakes feeling refreshed: yes Good sleep hygiene: yes Apnea: no Snoring: no Depressed/anxious mood: no Recent stress: yes Restless legs/nocturnal leg cramps: no Chronic pain/arthritis: no Treatments attempted: melatonin, uinsom and benadryl, lorazepam   HYPERTENSION / HYPERLIPIDEMIA Satisfied with current treatment? yes Duration of hypertension: chronic BP monitoring frequency: not checking BP medication side effects: no Past BP meds:  Duration of hyperlipidemia: chronic Cholesterol medication side effects: no Cholesterol supplements: none Past cholesterol medications:  Medication compliance: good compliance Aspirin: no Recent stressors: yes Recurrent headaches: no Visual changes: no Palpitations: no Dyspnea: no Chest pain: no Lower extremity edema: no Dizzy/lightheaded: no  DIABETES Hypoglycemic episodes:no Polydipsia/polyuria: no Visual disturbance: no Chest pain: no Paresthesias: no  Glucose Monitoring: yes  Accucheck frequency: Daily  Fasting glucose: 98 Taking Insulin?: yes Blood Pressure Monitoring: not checking Retinal Examination: Up to Date Foot Exam: Up to Date Diabetic Education: Completed Pneumovax: Given today Influenza: Up to Date Aspirin: no  ANXIETY/DEPRESSION Duration:controlled Anxious mood: yes  Excessive worrying: yes Irritability: yes  Sweating: no Nausea: no Palpitations:no Hyperventilation: no Panic attacks: no Agoraphobia: no  Obscessions/compulsions: no Depressed mood: yes Depression screen [facility] 2/9 10/21/2018 07/21/2018 06/25/2018 01/20/2018 10/26/2017  Decreased Interest 0 0 0 0 0  Down, Depressed, Hopeless 0 1 0 0 0  PHQ - 2 Score 0 1 0 0 0  Altered sleeping 3 3 - 3 3  Tired, decreased energy 1 0 - 3 3  Change in appetite 3 1 - 3 2  Feeling bad or failure about yourself  0 0 - 0 0  Trouble concentrating 0 0 - 0 0  Moving slowly or fidgety/restless 0 0 - 3 0  Suicidal thoughts 0 0 - 3 0  PHQ-9 Score 7 5 - 15 8  Difficult doing work/chores - Not difficult at all - - Somewhat difficult  Some recent data might be hidden   GAD 7 : Generalized Anxiety Score 10/21/2018 07/21/2018 10/26/2017 10/26/2017  Nervous, Anxious, on Edge 3 3 0 0  Control/stop worrying 0 0 1 1  Worry too much - different things 2 1 1 1   Trouble relaxing 1 0 1 1  Restless 0 0 0 0  Easily annoyed or irritable 3 0 0 0  Afraid - awful might happen 1 3 0 0  Total GAD 7 Score 10 7 3 3   Anxiety Difficulty Not difficult at all Not difficult at all Somewhat difficult -   GAD 7 :  Generalized Anxiety Score 10/21/2018 07/21/2018 10/26/2017 10/26/2017  Nervous, Anxious, on Edge 3 3 0 0  Control/stop worrying 0 0 1 1  Worry too much - different things 2 1 1 1   Trouble relaxing 1 0 1 1  Restless 0 0 0 0  Easily annoyed or irritable 3 0 0 0  Afraid - awful might happen 1 3 0 0  Total GAD 7 Score 10 7 3 3   Anxiety Difficulty Not difficult at all Not difficult at all Somewhat  difficult -   Anhedonia: no Weight changes: no Insomnia: yes   Hypersomnia: no Fatigue/loss of energy: yes Feelings of worthlessness: no Feelings of guilt: no Impaired concentration/indecisiveness: no Suicidal ideations: no  Crying spells: no Recent Stressors/Life Changes: no   Relationship problems: no   Family stress: no     Financial stress: no    Job stress: no    Recent death/loss: no  Menopausal Symptoms: no  Functional Status Survey: Is the patient deaf or have difficulty hearing?: No Does the patient have difficulty seeing, even when wearing glasses/contacts?: No Does the patient have difficulty concentrating, remembering, or making decisions?: No Does the patient have difficulty walking or climbing stairs?: No Does the patient have difficulty dressing or bathing?: No Does the patient have difficulty doing errands alone such as visiting a doctor's office or shopping?: No  Fall Risk  10/21/2018 07/21/2018 06/25/2018 10/26/2017 10/26/2017  Falls in the past year? 0 0 0 No No  Number falls in past yr: - 0 0 - -  Injury with Fall? - 0 0 - -  Follow up - - - - -    Depression Screen Depression screen [facility] 2/9 10/21/2018 07/21/2018 06/25/2018 01/20/2018 10/26/2017  Decreased Interest 0 0 0 0 0  Down, Depressed, Hopeless 0 1 0 0 0  PHQ - 2 Score 0 1 0 0 0  Altered sleeping 3 3 - 3 3  Tired, decreased energy 1 0 - 3 3  Change in appetite 3 1 - 3 2  Feeling bad or failure about yourself  0 0 - 0 0  Trouble concentrating 0 0 - 0 0  Moving slowly or fidgety/restless 0 0 - 3 0  Suicidal thoughts 0 0 - 3 0  PHQ-9 Score 7 5 - 15 8  Difficult doing work/chores - Not difficult at all - - Somewhat difficult  Some recent data might be hidden     Advanced Directives Does patient have a HCPOA?    no Does patient have a living will or MOST form?  no  Past Medical History:  Past Medical History:  Diagnosis Date  . Anxiety   . Constipation 08/17/2015  . Diabetes mellitus without  complication ([place])   . DVT (deep venous thrombosis) ([place]) 2006   chronic anticoagulation  . Encounter for monitoring Coumadin therapy 12/16/2016  . GERD (gastroesophageal reflux disease)   . Hyperlipidemia   . Hypertension   . Insomnia   . Monitoring for anticoagulant use 09/21/2014   Goal INR 2-3; recurrent DVTs, paroxysmal atrial fibrillation.  Unable to take Xaralto  . Myalgia 02/23/2017  . Sebaceous cyst 06/11/2015  . Skin lesion of back 09/05/2016  . Trapezius muscle spasm 05/22/2017  . Urinary frequency 11/12/2016  . Vitamin B12 deficiency   . Vitamin D deficiency disease     Surgical History:  Past Surgical History:  Procedure Laterality Date  . ABDOMINAL HYSTERECTOMY      Medications:  Current Outpatient Medications on File Prior  to Visit  Medication Sig  . acetaminophen (TYLENOL) 500 MG tablet Take 500 mg every 6 (six) hours as needed by mouth. As needed  . acetic acid-hydrocortisone (VOSOL-HC) OTIC solution Place 3 drops into both ears 2 (two) times daily.  Marland Kitchen azelastine (OPTIVAR) 0.05 % ophthalmic solution Place 1 drop into both eyes 2 (two) times daily.  . baclofen (LIORESAL) 10 MG tablet Take 1 tablet (10 mg total) by mouth 3 (three) times daily.  . BD INSULIN SYRINGE U/F 31G X 5/16" 0.5 ML MISC USE DAILY AS DIRECTED FOR DIABETES  . Cholecalciferol (VITAMIN D3) 1000 units CAPS Take by mouth. Daily  . erythromycin ophthalmic ointment Place 1 application into both eyes 2 (two) times daily as needed.  . fluticasone (FLONASE) 50 MCG/ACT nasal spray Place 2 sprays into both nostrils daily.  Marland Kitchen glipiZIDE (GLIPIZIDE XL) 10 MG 24 hr tablet Take 1 tablet (10 mg total) by mouth daily with breakfast.  . insulin NPH-regular Human (NOVOLIN 70/30) (70-30) 100 UNIT/ML injection INJECT AS DIRECTED; INCREASE TO 45 UNITS UNDER THE SKIN EVERY MORNING WITH BREAKFAST AND 30 UNITS BEFORE EVENING MEAL OR AS DIRECTED  . magnesium oxide (MAG-OX) 400 MG tablet Take by mouth daily.   . ramipril  (ALTACE) 10 MG capsule Take 1 capsule (10 mg total) by mouth daily. To be taken with her 10mg  cap for a total of 15mg  daily  . ramipril (ALTACE) 5 MG capsule Take 1 capsule (5 mg total) by mouth daily. To be taken with her 10mg  cap for a total of 15mg  daily  . TRUETRACK TEST test strip   . vitamin C (ASCORBIC ACID) 500 MG tablet Take 500 mg by mouth daily.   No current facility-administered medications on file prior to visit.     Allergies:  Allergies  Allergen Reactions  . Ciprofloxacin Palpitations    Severe Headache  . Clindamycin/Lincomycin Nausea Only    Made her head hurt  . Hctz [Hydrochlorothiazide] Other (See Comments)    Abdominal pain  . Sulfur Itching  . Doxycycline Hyclate Palpitations    Social History:  Social History   Socioeconomic History  . Marital status: Divorced    Spouse name: Not on file  . Number of children: Not on file  . Years of education: Not on file  . Highest education level: GED or equivalent  Occupational History  . Not on file  Social Needs  . Financial resource strain: Somewhat hard  . Food insecurity    Worry: Sometimes true    Inability: Sometimes true  . Transportation needs    Medical: No    Non-medical: No  Tobacco Use  . Smoking status: Former Smoker    Packs/day: 0.50    Years: 30.00    Pack years: 15.00    Types: Cigarettes    Quit date: 03/31/1977    Years since quitting: 41.5  . Smokeless tobacco: Never Used  Substance and Sexual Activity  . Alcohol use: No    Alcohol/week: 0.0 standard drinks    Comment: rare  . Drug use: No  . Sexual activity: Never  Lifestyle  . Physical activity    Days per week: 0 days    Minutes per session: 0 min  . Stress: Not at all  Relationships  . Social connections    Talks on phone: More than three times a week    Gets together: More than three times a week    Attends religious service: More than 4 times per year  Active member of club or organization: No    Attends meetings  of clubs or organizations: Never    Relationship status: Divorced  . Intimate partner violence    Fear of current or ex partner: No    Emotionally abused: No    Physically abused: No    Forced sexual activity: No  Other Topics Concern  . Not on file  Social History Narrative  . Not on file   Social History   Tobacco Use  Smoking Status Former Smoker  . Packs/day: 0.50  . Years: 30.00  . Pack years: 15.00  . Types: Cigarettes  . Quit date: 03/31/1977  . Years since quitting: 41.5  Smokeless Tobacco Never Used   Social History   Substance and Sexual Activity  Alcohol Use No  . Alcohol/week: 0.0 standard drinks   Comment: rare    Family History:  Family History  Problem Relation Age of Onset  . Diabetes Maternal Grandmother   . Hypertension Mother   . Cancer Daughter        breast  . Heart disease Son   . Heart disease Son     Past medical history, surgical history, medications, allergies, family history and social history reviewed with patient today and changes made to appropriate areas of the chart.   Review of Systems  Constitutional: Negative.   HENT: Negative.        Itchy ears   Eyes: Negative.   Respiratory: Positive for cough. Negative for hemoptysis, sputum production, shortness of breath and wheezing.   Cardiovascular: Positive for palpitations. Negative for chest pain, orthopnea, claudication, leg swelling and PND.  Gastrointestinal: Positive for constipation and heartburn (medicine helps). Negative for abdominal pain, blood in stool, diarrhea, melena, nausea and vomiting.  Genitourinary: Negative.   Musculoskeletal: Negative.   Skin: Positive for itching. Negative for rash.  Neurological: Negative.   Endo/Heme/Allergies: Positive for environmental allergies. Negative for polydipsia. Does not bruise/bleed easily.  Psychiatric/Behavioral: Negative for depression, hallucinations, memory loss, substance abuse and suicidal ideas. The patient is  nervous/anxious and has insomnia.     All other ROS negative except what is listed above and in the HPI.      Objective:    BP (!) 168/81   Pulse (!) 39   Temp 98.3 F (36.8 C) (Oral)   Ht 5\' 2"  (1.575 m)   Wt 203 lb (92.1 kg)   LMP  (LMP Unknown)   SpO2 97%   BMI 37.13 kg/m   Wt Readings from Last 3 Encounters:  10/21/18 203 lb (92.1 kg)  10/08/18 203 lb (92.1 kg)  10/07/18 200 lb (90.7 kg)    Physical Exam Vitals signs and nursing note reviewed.  Constitutional:      General: She is not in acute distress.    Appearance: Normal appearance. She is not ill-appearing, toxic-appearing or diaphoretic.  HENT:     Head: Normocephalic and atraumatic.     Right Ear: Tympanic membrane, ear canal and external ear normal. There is no impacted cerumen.     Left Ear: Tympanic membrane, ear canal and external ear normal. There is no impacted cerumen.     Nose: Nose normal. No congestion or rhinorrhea.     Mouth/Throat:     Mouth: Mucous membranes are moist.     Pharynx: Oropharynx is clear. No oropharyngeal exudate or posterior oropharyngeal erythema.  Eyes:     General: No scleral icterus.       Right eye: No discharge.  Left eye: No discharge.     Extraocular Movements: Extraocular movements intact.     Conjunctiva/sclera: Conjunctivae normal.     Pupils: Pupils are equal, round, and reactive to light.  Neck:     Musculoskeletal: Normal range of motion and neck supple. No neck rigidity or muscular tenderness.     Vascular: No carotid bruit.  Cardiovascular:     Rate and Rhythm: Normal rate and regular rhythm.     Pulses: Normal pulses.     Heart sounds: No murmur. No friction rub. No gallop.   Pulmonary:     Effort: Pulmonary effort is normal. No respiratory distress.     Breath sounds: Normal breath sounds. No stridor. No wheezing, rhonchi or rales.  Chest:     Chest wall: No tenderness.  Abdominal:     General: Abdomen is flat. Bowel sounds are normal. There is no  distension.     Palpations: Abdomen is soft. There is no mass.     Tenderness: There is no abdominal tenderness. There is no right CVA tenderness, left CVA tenderness, guarding or rebound.     Hernia: No hernia is present.  Genitourinary:    Comments: Breast and pelvic exams deferred with shared decision making Musculoskeletal:        General: No swelling, tenderness, deformity or signs of injury.     Right lower leg: No edema.     Left lower leg: No edema.  Lymphadenopathy:     Cervical: No cervical adenopathy.  Skin:    General: Skin is warm and dry.     Capillary Refill: Capillary refill takes less than 2 seconds.     Coloration: Skin is not jaundiced or pale.     Findings: No bruising, erythema, lesion or rash.  Neurological:     General: No focal deficit present.     Mental Status: She is alert and oriented to person, place, and time. Mental status is at baseline.     Cranial Nerves: No cranial nerve deficit.     Sensory: No sensory deficit.     Motor: No weakness.     Coordination: Coordination normal.     Gait: Gait normal.     Deep Tendon Reflexes: Reflexes normal.  Psychiatric:        Mood and Affect: Mood normal.        Behavior: Behavior normal.        Thought Content: Thought content normal.        Judgment: Judgment normal.     6CIT Screen 10/21/2018 10/12/2017 09/05/2016  What Year? 0 points 0 points 0 points  What month? 3 points 0 points 0 points  What time? 0 points 0 points 0 points  Count back from 20 2 points 0 points 0 points  Months in reverse 0 points 0 points 0 points  Repeat phrase 6 points 2 points 0 points  Total Score 11 2 0    Results for orders placed or performed in visit on 10/21/18  Bayer DCA Hb A1c Waived  Result Value Ref Range   HB A1C (BAYER DCA - WAIVED) 8.6 (H) <7.0 %  CoaguChek XS/INR Waived  Result Value Ref Range   INR 3.3 (H) 0.9 - 1.1   Prothrombin Time 39.4 sec  Microalbumin, Urine Waived  Result Value Ref Range    Microalb, Ur Waived 30 (H) 0 - 19 mg/L   Creatinine, Urine Waived 100 10 - 300 mg/dL   Microalb/Creat Ratio <30 <30 mg/g  UA/M w/rflx Culture, Routine   Specimen: Blood   BLD  Result Value Ref Range   Specific Gravity, UA 1.020 1.005 - 1.030   pH, UA 5.0 5.0 - 7.5   Color, UA Yellow Yellow   Appearance Ur Clear Clear   Leukocytes,UA Negative Negative   Protein,UA Negative Negative/Trace   Glucose, UA 1+ (A) Negative   Ketones, UA Negative Negative   RBC, UA Negative Negative   Bilirubin, UA Negative Negative   Urobilinogen, Ur 0.2 0.2 - 1.0 mg/dL   Nitrite, UA Negative Negative      Assessment & Plan:   Problem List Items Addressed This Visit      Cardiovascular and Mediastinum   Aortic atherosclerosis (HCC) (Chronic)    Will keep BP, sugars and cholesterol under good control. Continue current regimen. Continue to monitor. Call with any concerns.       Relevant Medications   amLODipine (NORVASC) 10 MG tablet   warfarin (COUMADIN) 1 MG tablet   warfarin (COUMADIN) 6 MG tablet   Other Relevant Orders   CBC with Differential/Platelet   Comprehensive metabolic panel   Lipid Panel w/o Chol/HDL Ratio   TSH   UA/M w/rflx Culture, Routine (Completed)   Coronary artery disease (Chronic)    Will keep BP, sugars and cholesterol under good control. Continue current regimen. Continue to monitor. Call with any concerns.       Relevant Medications   amLODipine (NORVASC) 10 MG tablet   warfarin (COUMADIN) 1 MG tablet   warfarin (COUMADIN) 6 MG tablet   Other Relevant Orders   CBC with Differential/Platelet   Comprehensive metabolic panel   TSH   UA/M w/rflx Culture, Routine (Completed)   Benign essential hypertension    BP running high here, better at home. Will start jardiance and recheck 1 month. Call with any concerns.       Relevant Medications   amLODipine (NORVASC) 10 MG tablet   warfarin (COUMADIN) 1 MG tablet   warfarin (COUMADIN) 6 MG tablet   Other Relevant  Orders   CBC with Differential/Platelet   Comprehensive metabolic panel   TSH   UA/M w/rflx Culture, Routine (Completed)   Paroxysmal A-fib (HCC)    Regular rhythm today. Continue to monitor. Supratherapeutic INR- will recheck 1 week.       Relevant Medications   amLODipine (NORVASC) 10 MG tablet   warfarin (COUMADIN) 1 MG tablet   warfarin (COUMADIN) 6 MG tablet   Other Relevant Orders   CBC with Differential/Platelet   CoaguChek XS/INR Waived (Completed)   Comprehensive metabolic panel   TSH   UA/M w/rflx Culture, Routine (Completed)   DVT, lower extremity, recurrent (HCC)    Supratherapeutic INR- will recheck 1 week.       Relevant Medications   amLODipine (NORVASC) 10 MG tablet   warfarin (COUMADIN) 1 MG tablet   warfarin (COUMADIN) 6 MG tablet   Other Relevant Orders   CBC with Differential/Platelet   CoaguChek XS/INR Waived (Completed)   Comprehensive metabolic panel   TSH   UA/M w/rflx Culture, Routine (Completed)     Digestive   Fatty liver (Chronic)    Rechecking labs today. Await results.       Relevant Orders   CBC with Differential/Platelet   Comprehensive metabolic panel   TSH   UA/M w/rflx Culture, Routine (Completed)     Endocrine   Diabetes mellitus type 2, uncontrolled, without complications (HCC) (Chronic)    Sugars up at 8.6- will start jardiance and  recheck 3 months. Call with any concerns.       Relevant Medications   empagliflozin (JARDIANCE) 25 MG TABS tablet   Other Relevant Orders   Bayer DCA Hb A1c Waived (Completed)   CBC with Differential/Platelet   Comprehensive metabolic panel   Lipid Panel w/o Chol/HDL Ratio   Microalbumin, Urine Waived (Completed)   TSH   UA/M w/rflx Culture, Routine (Completed)     Other   Hyperlipidemia, mixed    Under good control on current regimen. Continue current regimen. Continue to monitor. Call with any concerns. Refills given. Labs drawn today.       Relevant Medications   amLODipine  (NORVASC) 10 MG tablet   warfarin (COUMADIN) 1 MG tablet   warfarin (COUMADIN) 6 MG tablet   Other Relevant Orders   CBC with Differential/Platelet   Comprehensive metabolic panel   TSH   UA/M w/rflx Culture, Routine (Completed)   Anxiety, generalized    Stable. Continue current regimen. Continue to monitor. Call with any concerns. Refills given today.       Relevant Medications   LORazepam (ATIVAN) 1 MG tablet   Other Relevant Orders   CBC with Differential/Platelet   Comprehensive metabolic panel   TSH   UA/M w/rflx Culture, Routine (Completed)   Major depression    Under good control on current regimen. Continue current regimen. Continue to monitor. Call with any concerns. Refills given today..        Relevant Medications   LORazepam (ATIVAN) 1 MG tablet   Other Relevant Orders   CBC with Differential/Platelet   Comprehensive metabolic panel   TSH   UA/M w/rflx Culture, Routine (Completed)   Insomnia    Under good control on current regimen. Continue current regimen. Continue to monitor. Call with any concerns. Refills given for 3 months. Call with any concerns.        Relevant Orders   CBC with Differential/Platelet   Comprehensive metabolic panel   TSH   UA/M w/rflx Culture, Routine (Completed)    Other Visit Diagnoses    Encounter for Medicare annual wellness exam    -  Primary   Preventative care discussed today as below.    Routine general medical examination at a health care facility       Vaccines updated. Screening labs checked today. DEXA up to date. Continue diet and exercise. Continue to monitor. Call with any concerns.    Need for pneumococcal vaccine       Prevnar given today.    Paroxysmal atrial fibrillation (HCC)       Relevant Medications   amLODipine (NORVASC) 10 MG tablet   warfarin (COUMADIN) 1 MG tablet   warfarin (COUMADIN) 6 MG tablet       Preventative Services:  Health Risk Assessment and Personalized Prevention Plan: Done today  Bone Mass Measurements: UTD Breast Cancer Screening: N/A CVD Screening: Done today Cervical Cancer Screening: N/A Colon Cancer Screening: N/A Depression Screening: Done today Diabetes Screening: Done today Glaucoma Screening: See your eye doctor Hepatitis B vaccine: N/A Hepatitis C screening: N/A HIV Screening: UTD Flu Vaccine: Get in the fall Lung cancer Screening: N/A Obesity Screening: Done today Pneumonia Vaccines (2): Given today STI Screening: N/A  Follow up plan: Return for 1 week virtual (INR) 3 months DM follow up.   LABORATORY TESTING:  - Pap smear: not applicable  IMMUNIZATIONS:   - Tdap: Tetanus vaccination status reviewed: Refused. - Influenza: Postponed to flu season - Pneumovax: Not applicable - Prevnar: Up  to date - Zostavax vaccine: Not applicable  SCREENING: -Mammogram: Not applicable  - Colonoscopy: Not applicable  - Bone Density: Ordered today   PATIENT COUNSELING:   Advised to take 1 mg of folate supplement per day if capable of pregnancy.   Sexuality: Discussed sexually transmitted diseases, partner selection, use of condoms, avoidance of unintended pregnancy  and contraceptive alternatives.   Advised to avoid cigarette smoking.  I discussed with the patient that most people either abstain from alcohol or drink within safe limits (<=14/week and <=4 drinks/occasion for males, <=7/weeks and <= 3 drinks/occasion for females) and that the risk for alcohol disorders and other health effects rises proportionally with the number of drinks per week and how often a drinker exceeds daily limits.  Discussed cessation/primary prevention of drug use and availability of treatment for abuse.   Diet: Encouraged to adjust caloric intake to maintain  or achieve ideal body weight, to reduce intake of dietary saturated fat and total fat, to limit sodium intake by avoiding high sodium foods and not adding table salt, and to maintain adequate dietary potassium and  calcium preferably from fresh fruits, vegetables, and low-fat dairy products.    stressed the importance of regular exercise  Injury prevention: Discussed safety belts, safety helmets, smoke detector, smoking near bedding or upholstery.   Dental health: Discussed importance of regular tooth brushing, flossing, and dental visits.    NEXT PREVENTATIVE PHYSICAL DUE IN 1 YEAR. Return for 1 week virtual (INR) 3 months DM follow up.

## 2018-10-21 ENCOUNTER — Other Ambulatory Visit: Payer: Self-pay

## 2018-10-21 ENCOUNTER — Ambulatory Visit: Payer: Medicare HMO | Admitting: Family Medicine

## 2018-10-21 ENCOUNTER — Encounter: Payer: Self-pay | Admitting: Family Medicine

## 2018-10-21 VITALS — BP 168/81 | HR 39 | Temp 98.3°F | Ht 62.0 in | Wt 203.0 lb

## 2018-10-21 DIAGNOSIS — I2510 Atherosclerotic heart disease of native coronary artery without angina pectoris: Secondary | ICD-10-CM | POA: Diagnosis not present

## 2018-10-21 DIAGNOSIS — Z23 Encounter for immunization: Secondary | ICD-10-CM

## 2018-10-21 DIAGNOSIS — Z794 Long term (current) use of insulin: Secondary | ICD-10-CM | POA: Diagnosis not present

## 2018-10-21 DIAGNOSIS — I10 Essential (primary) hypertension: Secondary | ICD-10-CM

## 2018-10-21 DIAGNOSIS — Z0000 Encounter for general adult medical examination without abnormal findings: Secondary | ICD-10-CM | POA: Diagnosis not present

## 2018-10-21 DIAGNOSIS — I700 Atherosclerosis of aorta: Secondary | ICD-10-CM | POA: Diagnosis not present

## 2018-10-21 DIAGNOSIS — I480 Paroxysmal atrial fibrillation: Secondary | ICD-10-CM

## 2018-10-21 DIAGNOSIS — F411 Generalized anxiety disorder: Secondary | ICD-10-CM

## 2018-10-21 DIAGNOSIS — E782 Mixed hyperlipidemia: Secondary | ICD-10-CM | POA: Diagnosis not present

## 2018-10-21 DIAGNOSIS — I82403 Acute embolism and thrombosis of unspecified deep veins of lower extremity, bilateral: Secondary | ICD-10-CM | POA: Diagnosis not present

## 2018-10-21 DIAGNOSIS — F5101 Primary insomnia: Secondary | ICD-10-CM

## 2018-10-21 DIAGNOSIS — E1165 Type 2 diabetes mellitus with hyperglycemia: Secondary | ICD-10-CM

## 2018-10-21 DIAGNOSIS — K760 Fatty (change of) liver, not elsewhere classified: Secondary | ICD-10-CM

## 2018-10-21 DIAGNOSIS — F322 Major depressive disorder, single episode, severe without psychotic features: Secondary | ICD-10-CM

## 2018-10-21 DIAGNOSIS — I82409 Acute embolism and thrombosis of unspecified deep veins of unspecified lower extremity: Secondary | ICD-10-CM | POA: Diagnosis not present

## 2018-10-21 DIAGNOSIS — IMO0001 Reserved for inherently not codable concepts without codable children: Secondary | ICD-10-CM

## 2018-10-21 LAB — UA/M W/RFLX CULTURE, ROUTINE
Bilirubin, UA: NEGATIVE
Ketones, UA: NEGATIVE
Leukocytes,UA: NEGATIVE
Nitrite, UA: NEGATIVE
Protein,UA: NEGATIVE
RBC, UA: NEGATIVE
Specific Gravity, UA: 1.02 (ref 1.005–1.030)
Urobilinogen, Ur: 0.2 mg/dL (ref 0.2–1.0)
pH, UA: 5 (ref 5.0–7.5)

## 2018-10-21 LAB — COAGUCHEK XS/INR WAIVED
INR: 3.3 — ABNORMAL HIGH (ref 0.9–1.1)
Prothrombin Time: 39.4 s

## 2018-10-21 LAB — MICROALBUMIN, URINE WAIVED
Creatinine, Urine Waived: 100 mg/dL (ref 10–300)
Microalb, Ur Waived: 30 mg/L — ABNORMAL HIGH (ref 0–19)
Microalb/Creat Ratio: <30 mg/g (ref <30)

## 2018-10-21 LAB — BAYER DCA HB A1C WAIVED: HB A1C (BAYER DCA - WAIVED): 8.6 % — ABNORMAL HIGH (ref <7.0)

## 2018-10-21 MED ORDER — WARFARIN SODIUM 1 MG PO TABS: ORAL_TABLET | ORAL | 1 refills | Status: DC

## 2018-10-21 MED ORDER — JARDIANCE 25 MG PO TABS: 25.0000 mg | ORAL_TABLET | Freq: Every day | ORAL | 3 refills | Status: DC

## 2018-10-21 MED ORDER — FLUCONAZOLE 150 MG PO TABS: 150.0000 mg | ORAL_TABLET | Freq: Once | ORAL | 0 refills | Status: AC

## 2018-10-21 MED ORDER — AMLODIPINE BESYLATE 10 MG PO TABS: 10.0000 mg | ORAL_TABLET | Freq: Every day | ORAL | 1 refills | Status: DC

## 2018-10-21 MED ORDER — MELOXICAM 15 MG PO TABS: 15.0000 mg | ORAL_TABLET | Freq: Every day | ORAL | 3 refills | Status: DC

## 2018-10-21 MED ORDER — LORAZEPAM 1 MG PO TABS: 1.0000 mg | ORAL_TABLET | Freq: Every evening | ORAL | 2 refills | Status: DC | PRN

## 2018-10-21 MED ORDER — WARFARIN SODIUM 6 MG PO TABS: 6.0000 mg | ORAL_TABLET | Freq: Every day | ORAL | 1 refills | Status: DC

## 2018-10-21 NOTE — Assessment & Plan Note (Signed)
Will keep BP, sugars and cholesterol under good control. Continue current regimen. Continue to monitor. Call with any concerns.

## 2018-10-21 NOTE — Assessment & Plan Note (Signed)
Stable. Continue current regimen. Continue to monitor. Call with any concerns. Refills given today. 

## 2018-10-21 NOTE — Assessment & Plan Note (Signed)
Regular rhythm today. Continue to monitor. Supratherapeutic INR- will recheck 1 week.

## 2018-10-21 NOTE — Assessment & Plan Note (Signed)
Under good control on current regimen. Continue current regimen. Continue to monitor. Call with any concerns. Refills given. Labs drawn today.   

## 2018-10-21 NOTE — Assessment & Plan Note (Signed)
Supratherapeutic INR- will recheck 1 week.

## 2018-10-21 NOTE — Assessment & Plan Note (Signed)
Under good control on current regimen. Continue current regimen. Continue to monitor. Call with any concerns. Refills given today.   

## 2018-10-21 NOTE — Assessment & Plan Note (Signed)
BP running high here, better at home. Will start jardiance and recheck 1 month. Call with any concerns.

## 2018-10-21 NOTE — Assessment & Plan Note (Signed)
Under good control on current regimen. Continue current regimen. Continue to monitor. Call with any concerns. Refills given for 3 months. Call with any concerns.  ?

## 2018-10-21 NOTE — Assessment & Plan Note (Signed)
Rechecking labs today. Await results.  

## 2018-10-21 NOTE — Assessment & Plan Note (Signed)
Sugars up at 8.6- will start jardiance and recheck 3 months. Call with any concerns.

## 2018-10-22 LAB — COMPREHENSIVE METABOLIC PANEL
ALT: 18 IU/L (ref 0–32)
AST: 18 IU/L (ref 0–40)
Albumin/Globulin Ratio: 1.8 (ref 1.2–2.2)
Albumin: 4.1 g/dL (ref 3.6–4.6)
Alkaline Phosphatase: 95 IU/L (ref 39–117)
BUN/Creatinine Ratio: 13 (ref 12–28)
BUN: 9 mg/dL (ref 8–27)
Bilirubin Total: 0.5 mg/dL (ref 0.0–1.2)
CO2: 21 mmol/L (ref 20–29)
Calcium: 9.5 mg/dL (ref 8.7–10.3)
Chloride: 101 mmol/L (ref 96–106)
Creatinine, Ser: 0.69 mg/dL (ref 0.57–1.00)
GFR calc Af Amer: 92 mL/min/{1.73_m2} (ref >59)
GFR calc non Af Amer: 80 mL/min/{1.73_m2} (ref >59)
Globulin, Total: 2.3 g/dL (ref 1.5–4.5)
Glucose: 223 mg/dL — ABNORMAL HIGH (ref 65–99)
Potassium: 4.5 mmol/L (ref 3.5–5.2)
Sodium: 140 mmol/L (ref 134–144)
Total Protein: 6.4 g/dL (ref 6.0–8.5)

## 2018-10-22 LAB — CBC WITH DIFFERENTIAL/PLATELET
Basophils Absolute: 0 10*3/uL (ref 0.0–0.2)
Basos: 1 %
EOS (ABSOLUTE): 0.1 10*3/uL (ref 0.0–0.4)
Eos: 3 %
Hematocrit: 42.7 % (ref 34.0–46.6)
Hemoglobin: 13.9 g/dL (ref 11.1–15.9)
Immature Grans (Abs): 0 10*3/uL (ref 0.0–0.1)
Immature Granulocytes: 1 %
Lymphocytes Absolute: 1.6 10*3/uL (ref 0.7–3.1)
Lymphs: 37 %
MCH: 28.9 pg (ref 26.6–33.0)
MCHC: 32.6 g/dL (ref 31.5–35.7)
MCV: 89 fL (ref 79–97)
Monocytes Absolute: 0.4 10*3/uL (ref 0.1–0.9)
Monocytes: 9 %
Neutrophils Absolute: 2.2 10*3/uL (ref 1.4–7.0)
Neutrophils: 49 %
Platelets: 227 10*3/uL (ref 150–450)
RBC: 4.81 x10E6/uL (ref 3.77–5.28)
RDW: 13.8 % (ref 11.7–15.4)
WBC: 4.4 10*3/uL (ref 3.4–10.8)

## 2018-10-22 LAB — TSH: TSH: 2.09 u[IU]/mL (ref 0.450–4.500)

## 2018-10-22 LAB — LIPID PANEL W/O CHOL/HDL RATIO
Cholesterol, Total: 234 mg/dL — ABNORMAL HIGH (ref 100–199)
HDL: 52 mg/dL (ref >39)
LDL Calculated: 132 mg/dL — ABNORMAL HIGH (ref 0–99)
Triglycerides: 252 mg/dL — ABNORMAL HIGH (ref 0–149)
VLDL Cholesterol Cal: 50 mg/dL — ABNORMAL HIGH (ref 5–40)

## 2018-10-28 ENCOUNTER — Encounter: Payer: Self-pay | Admitting: Family Medicine

## 2018-10-28 LAB — POCT INR

## 2018-10-31 ENCOUNTER — Encounter: Payer: Self-pay | Admitting: Family Medicine

## 2018-11-04 ENCOUNTER — Other Ambulatory Visit: Payer: Self-pay

## 2018-11-04 ENCOUNTER — Encounter: Payer: Self-pay | Admitting: Family Medicine

## 2018-11-04 ENCOUNTER — Ambulatory Visit (INDEPENDENT_AMBULATORY_CARE_PROVIDER_SITE_OTHER): Payer: Medicare HMO | Admitting: Family Medicine

## 2018-11-04 VITALS — Ht 62.0 in | Wt 200.0 lb

## 2018-11-04 DIAGNOSIS — I480 Paroxysmal atrial fibrillation: Secondary | ICD-10-CM | POA: Diagnosis not present

## 2018-11-04 DIAGNOSIS — Z7901 Long term (current) use of anticoagulants: Secondary | ICD-10-CM | POA: Diagnosis not present

## 2018-11-04 DIAGNOSIS — Z5321 Procedure and treatment not carried out due to patient leaving prior to being seen by health care provider: Secondary | ICD-10-CM

## 2018-11-04 LAB — POCT INR: INR: 3.6 — AB (ref 2.0–3.0)

## 2018-11-04 NOTE — Progress Notes (Signed)
Patient unable to get INR today. Spoke at 10:30AM, 3:49, and 4:35PM. Will change appointment to tomorrow as unable to get INR today.

## 2018-11-04 NOTE — Progress Notes (Deleted)
Ht 5\' 2"  (1.575 m)   Wt 200 lb (90.7 kg)   LMP  (LMP Unknown)   BMI 36.58 kg/m    Subjective:    Patient ID: Daisy Watkins, female    DOB: 12-Apr-1932, 85 y.o.   MRN: [id number]  CC: Coumadin management  HPI: This patient is a 85 y.o. female who presents for coumadin management. The expected duration of coumadin treatment is lifelong The reason for anticoagulation is  A. Fib.  Present Coumadin dose: Goal: {Blank single:19197::"2.0-3.0","2.5-3.5"} The patient does not have an INR goal. Excessive bruising: {Blank single:19197::"yes","no"} Nose bleeding: {Blank single:19197::"yes","no"} Rectal bleeding: {Blank single:19197::"yes","no"} Prolonged menstrual cycles: {Blank single:19197::"yes","no","N/A"} Eating diet with consistent amounts of foods containing Vitamin K:{Blank single:19197::"yes","no"} Any recent antibiotic use? {Blank single:19197::"yes","no"}  Relevant past medical, surgical, family and social history reviewed and updated as indicated. Interim medical history since our last visit reviewed. Allergies and medications reviewed and updated.  ROS: Per HPI unless specifically indicated above     Objective:    Ht 5\' 2"  (1.575 m)   Wt 200 lb (90.7 kg)   LMP  (LMP Unknown)   BMI 36.58 kg/m   Wt Readings from Last 3 Encounters:  11/04/18 200 lb (90.7 kg)  10/21/18 203 lb (92.1 kg)  10/08/18 203 lb (92.1 kg)     General: Well appearing, well nourished in no distress.  Normal mood and affect. Skin: No excessive bruising or rash  Last INR:     Last CBC:  Lab Results  Component Value Date   WBC 4.4 10/21/2018   HGB 13.9 10/21/2018   HCT 42.7 10/21/2018   MCV 89 10/21/2018   PLT 227 10/21/2018    Results for orders placed or performed in visit on 10/21/18  Bayer DCA Hb A1c Waived  Result Value Ref Range   HB A1C (BAYER DCA - WAIVED) 8.6 (H) <7.0 %  CBC with Differential/Platelet  Result Value Ref Range   WBC 4.4 3.4 - 10.8 x10E3/uL   RBC 4.81 3.77 - 5.28  x10E6/uL   Hemoglobin 13.9 11.1 - 15.9 g/dL   Hematocrit 42.7 34.0 - 46.6 %   MCV 89 79 - 97 fL   MCH 28.9 26.6 - 33.0 pg   MCHC 32.6 31.5 - 35.7 g/dL   RDW 13.8 11.7 - 15.4 %   Platelets 227 150 - 450 x10E3/uL   Neutrophils 49 Not Estab. %   Lymphs 37 Not Estab. %   Monocytes 9 Not Estab. %   Eos 3 Not Estab. %   Basos 1 Not Estab. %   Neutrophils Absolute 2.2 1.4 - 7.0 x10E3/uL   Lymphocytes Absolute 1.6 0.7 - 3.1 x10E3/uL   Monocytes Absolute 0.4 0.1 - 0.9 x10E3/uL   EOS (ABSOLUTE) 0.1 0.0 - 0.4 x10E3/uL   Basophils Absolute 0.0 0.0 - 0.2 x10E3/uL   Immature Granulocytes 1 Not Estab. %   Immature Grans (Abs) 0.0 0.0 - 0.1 x10E3/uL  CoaguChek XS/INR Waived  Result Value Ref Range   INR 3.3 (H) 0.9 - 1.1   Prothrombin Time 39.4 sec  Comprehensive metabolic panel  Result Value Ref Range   Glucose 223 (H) 65 - 99 mg/dL   BUN 9 8 - 27 mg/dL   Creatinine, Ser 0.69 0.57 - 1.00 mg/dL   GFR calc non Af Amer 80 >59 mL/min/1.73   GFR calc Af Amer 92 >59 mL/min/1.73   BUN/Creatinine Ratio 13 12 - 28   Sodium 140 134 - 144 mmol/L   Potassium  4.5 3.5 - 5.2 mmol/L   Chloride 101 96 - 106 mmol/L   CO2 21 20 - 29 mmol/L   Calcium 9.5 8.7 - 10.3 mg/dL   Total Protein 6.4 6.0 - 8.5 g/dL   Albumin 4.1 3.6 - 4.6 g/dL   Globulin, Total 2.3 1.5 - 4.5 g/dL   Albumin/Globulin Ratio 1.8 1.2 - 2.2   Bilirubin Total 0.5 0.0 - 1.2 mg/dL   Alkaline Phosphatase 95 39 - 117 IU/L   AST 18 0 - 40 IU/L   ALT 18 0 - 32 IU/L  Lipid Panel w/o Chol/HDL Ratio  Result Value Ref Range   Cholesterol, Total 234 (H) 100 - 199 mg/dL   Triglycerides 252 (H) 0 - 149 mg/dL   HDL 52 >39 mg/dL   VLDL Cholesterol Cal 50 (H) 5 - 40 mg/dL   LDL Calculated 132 (H) 0 - 99 mg/dL  Microalbumin, Urine Waived  Result Value Ref Range   Microalb, Ur Waived 30 (H) 0 - 19 mg/L   Creatinine, Urine Waived 100 10 - 300 mg/dL   Microalb/Creat Ratio <30 <30 mg/g  TSH  Result Value Ref Range   TSH 2.090 0.450 - 4.500 uIU/mL   UA/M w/rflx Culture, Routine   Specimen: Blood   BLD  Result Value Ref Range   Specific Gravity, UA 1.020 1.005 - 1.030   pH, UA 5.0 5.0 - 7.5   Color, UA Yellow Yellow   Appearance Ur Clear Clear   Leukocytes,UA Negative Negative   Protein,UA Negative Negative/Trace   Glucose, UA 1+ (A) Negative   Ketones, UA Negative Negative   RBC, UA Negative Negative   Bilirubin, UA Negative Negative   Urobilinogen, Ur 0.2 0.2 - 1.0 mg/dL   Nitrite, UA Negative Negative       Assessment:   No diagnosis found.  Plan:   Discussed current plan face-to-face with patient. For coumadin dosing, elected to {Blank single:19197::"continue current dose","change dose to","hold dose"}. Will plan to recheck INR in {Blank single:19197::"1 month","1 week","2 weeks"}.

## 2018-11-05 ENCOUNTER — Encounter: Payer: Self-pay | Admitting: Family Medicine

## 2018-11-05 ENCOUNTER — Other Ambulatory Visit: Payer: Self-pay

## 2018-11-05 ENCOUNTER — Ambulatory Visit (INDEPENDENT_AMBULATORY_CARE_PROVIDER_SITE_OTHER): Payer: Medicare HMO | Admitting: Family Medicine

## 2018-11-05 DIAGNOSIS — I480 Paroxysmal atrial fibrillation: Secondary | ICD-10-CM | POA: Diagnosis not present

## 2018-11-05 MED ORDER — WARFARIN SODIUM 5 MG PO TABS: 5.0000 mg | ORAL_TABLET | Freq: Every day | ORAL | 3 refills | Status: DC

## 2018-11-05 NOTE — Progress Notes (Signed)
Ht 5\' 2"  (1.575 m)   LMP  (LMP Unknown)   BMI 36.58 kg/m    Subjective:    Patient ID: Daisy Watkins, female    DOB: 1932-07-31, 85 y.o.   MRN: [id number]  CC: Coumadin management  HPI: This patient is a 85 y.o. female who presents for coumadin management. The expected duration of coumadin treatment is lifelong The reason for anticoagulation is  A. Fib.  Present Coumadin dose: 6mg  daily  Goal: 2.0-3.0  Excessive bruising: no Nose bleeding: no Rectal bleeding: no Prolonged menstrual cycles: N/A Eating diet with consistent amounts of foods containing Vitamin K:yes Any recent antibiotic use? no  Relevant past medical, surgical, family and social history reviewed and updated as indicated. Interim medical history since our last visit reviewed. Allergies and medications reviewed and updated.  ROS: Per HPI unless specifically indicated above     Objective:    Ht 5\' 2"  (1.575 m)   LMP  (LMP Unknown)   BMI 36.58 kg/m   Wt Readings from Last 3 Encounters:  11/04/18 200 lb (90.7 kg)  10/21/18 203 lb (92.1 kg)  10/08/18 203 lb (92.1 kg)     General: Well appearing, well nourished in no distress.  Normal mood and affect. Skin: No excessive bruising or rash  Last INR: 3.6    Last CBC:  Lab Results  Component Value Date   WBC 4.4 10/21/2018   HGB 13.9 10/21/2018   HCT 42.7 10/21/2018   MCV 89 10/21/2018   PLT 227 10/21/2018    Results for orders placed or performed in visit on 10/21/18  Bayer DCA Hb A1c Waived  Result Value Ref Range   HB A1C (BAYER DCA - WAIVED) 8.6 (H) <7.0 %  CBC with Differential/Platelet  Result Value Ref Range   WBC 4.4 3.4 - 10.8 x10E3/uL   RBC 4.81 3.77 - 5.28 x10E6/uL   Hemoglobin 13.9 11.1 - 15.9 g/dL   Hematocrit 42.7 34.0 - 46.6 %   MCV 89 79 - 97 fL   MCH 28.9 26.6 - 33.0 pg   MCHC 32.6 31.5 - 35.7 g/dL   RDW 13.8 11.7 - 15.4 %   Platelets 227 150 - 450 x10E3/uL   Neutrophils 49 Not Estab. %   Lymphs 37 Not Estab. %   Monocytes  9 Not Estab. %   Eos 3 Not Estab. %   Basos 1 Not Estab. %   Neutrophils Absolute 2.2 1.4 - 7.0 x10E3/uL   Lymphocytes Absolute 1.6 0.7 - 3.1 x10E3/uL   Monocytes Absolute 0.4 0.1 - 0.9 x10E3/uL   EOS (ABSOLUTE) 0.1 0.0 - 0.4 x10E3/uL   Basophils Absolute 0.0 0.0 - 0.2 x10E3/uL   Immature Granulocytes 1 Not Estab. %   Immature Grans (Abs) 0.0 0.0 - 0.1 x10E3/uL  CoaguChek XS/INR Waived  Result Value Ref Range   INR 3.3 (H) 0.9 - 1.1   Prothrombin Time 39.4 sec  Comprehensive metabolic panel  Result Value Ref Range   Glucose 223 (H) 65 - 99 mg/dL   BUN 9 8 - 27 mg/dL   Creatinine, Ser 0.69 0.57 - 1.00 mg/dL   GFR calc non Af Amer 80 >59 mL/min/1.73   GFR calc Af Amer 92 >59 mL/min/1.73   BUN/Creatinine Ratio 13 12 - 28   Sodium 140 134 - 144 mmol/L   Potassium 4.5 3.5 - 5.2 mmol/L   Chloride 101 96 - 106 mmol/L   CO2 21 20 - 29 mmol/L   Calcium 9.5  8.7 - 10.3 mg/dL   Total Protein 6.4 6.0 - 8.5 g/dL   Albumin 4.1 3.6 - 4.6 g/dL   Globulin, Total 2.3 1.5 - 4.5 g/dL   Albumin/Globulin Ratio 1.8 1.2 - 2.2   Bilirubin Total 0.5 0.0 - 1.2 mg/dL   Alkaline Phosphatase 95 39 - 117 IU/L   AST 18 0 - 40 IU/L   ALT 18 0 - 32 IU/L  Lipid Panel w/o Chol/HDL Ratio  Result Value Ref Range   Cholesterol, Total 234 (H) 100 - 199 mg/dL   Triglycerides 252 (H) 0 - 149 mg/dL   HDL 52 >39 mg/dL   VLDL Cholesterol Cal 50 (H) 5 - 40 mg/dL   LDL Calculated 132 (H) 0 - 99 mg/dL  Microalbumin, Urine Waived  Result Value Ref Range   Microalb, Ur Waived 30 (H) 0 - 19 mg/L   Creatinine, Urine Waived 100 10 - 300 mg/dL   Microalb/Creat Ratio <30 <30 mg/g  TSH  Result Value Ref Range   TSH 2.090 0.450 - 4.500 uIU/mL  UA/M w/rflx Culture, Routine   Specimen: Blood   BLD  Result Value Ref Range   Specific Gravity, UA 1.020 1.005 - 1.030   pH, UA 5.0 5.0 - 7.5   Color, UA Yellow Yellow   Appearance Ur Clear Clear   Leukocytes,UA Negative Negative   Protein,UA Negative Negative/Trace    Glucose, UA 1+ (A) Negative   Ketones, UA Negative Negative   RBC, UA Negative Negative   Bilirubin, UA Negative Negative   Urobilinogen, Ur 0.2 0.2 - 1.0 mg/dL   Nitrite, UA Negative Negative       Assessment:     ICD-10-CM   1. Paroxysmal A-fib ([place])  I48.0     Plan:   Discussed current plan face-to-face with patient. For coumadin dosing, elected to change dose to 5mg . Will plan to recheck INR in 5 days.   . This visit was completed via telephone due to the restrictions of the COVID-19 pandemic. All issues as above were discussed and addressed but no physical exam was performed. If it was felt that the patient should be evaluated in the office, they were directed there. The patient verbally consented to this visit. Patient was unable to complete an audio/visual visit due to Lack of equipment. Due to the catastrophic nature of the COVID-19 pandemic, this visit was done through audio contact only. . Location of the patient: home . Location of the provider: work . Those involved with this call:  . Provider: Park Liter, DO . CMA: Lesle Chris, [place] . Front Desk/Registration: Don Perking  . Time spent on call: 15 minutes on the phone discussing health concerns. 23 minutes total spent in review of patient's record and preparation of their chart.

## 2018-11-05 NOTE — Assessment & Plan Note (Signed)
Will decrease her coumadin to 5mg  daily will check her INR at home on Tuesday and then will have a phone call on Wednesday to adjust as needed.

## 2018-11-09 ENCOUNTER — Encounter: Payer: Self-pay | Admitting: Family Medicine

## 2018-11-09 LAB — POCT INR: INR: 2.2 (ref 2.0–3.0)

## 2018-11-11 ENCOUNTER — Telehealth: Payer: Self-pay | Admitting: Family Medicine

## 2018-11-11 NOTE — Telephone Encounter (Signed)
Attempted to call patient Daisy Watkins 2 with no answer and no voicemail.  Looks like she was supposed to have telephone visit today on review of previous note.  Please attempt to contact her this afternoon and let her know to continue same dose as she is currently taking and then will need follow-up in 4 weeks with repeat INR for Dr. Wynetta Emery.  Thank you.

## 2018-11-11 NOTE — Telephone Encounter (Signed)
Called and spoke to patient. Message relayed. Scheduled patient for 12/10/18 at 3:30 pm

## 2018-11-11 NOTE — Telephone Encounter (Signed)
Pt is calling to give her INR results which is 2.2. please advise

## 2018-11-18 ENCOUNTER — Encounter: Payer: Self-pay | Admitting: Family Medicine

## 2018-11-18 LAB — POCT INR: INR: 1.3 — AB (ref 2.0–3.0)

## 2018-11-24 ENCOUNTER — Ambulatory Visit (INDEPENDENT_AMBULATORY_CARE_PROVIDER_SITE_OTHER): Payer: Medicare HMO | Admitting: Family Medicine

## 2018-11-24 ENCOUNTER — Encounter: Payer: Self-pay | Admitting: Family Medicine

## 2018-11-24 ENCOUNTER — Other Ambulatory Visit: Payer: Self-pay

## 2018-11-24 DIAGNOSIS — I480 Paroxysmal atrial fibrillation: Secondary | ICD-10-CM

## 2018-11-24 NOTE — Progress Notes (Addendum)
   LMP  (LMP Unknown)    Subjective:    Patient ID: Daisy Watkins, female    DOB: 07/17/1932, 85 y.o.   MRN: [id number]  CC: Coumadin management  HPI: This patient is a 85 y.o. female who presents for coumadin management. The expected duration of coumadin treatment is lifelong The reason for anticoagulation is  A. Fib. It seems like last time we spoke, she got confused and started taking a 1mg  tablet of her warfarin rather than the 5mg  that was prescribed. Her last INR was 1.3  Present Coumadin dose: 1mg  Goal: 2.0-3.0  Excessive bruising: no Nose bleeding: no Rectal bleeding: no Prolonged menstrual cycles: N/A Eating diet with consistent amounts of foods containing Vitamin K:yes Any recent antibiotic use? no  Relevant past medical, surgical, family and social history reviewed and updated as indicated. Interim medical history since our last visit reviewed. Allergies and medications reviewed and updated.  ROS: Per HPI unless specifically indicated above     Objective:    LMP  (LMP Unknown)   Wt Readings from Last 3 Encounters:  11/04/18 200 lb (90.7 kg)  10/21/18 203 lb (92.1 kg)  10/08/18 203 lb (92.1 kg)     General: Well appearing, well nourished in no distress.  Normal mood and affect. Skin: No excessive bruising or rash  Last INR: 1.3    Last CBC:  Lab Results  Component Value Date   WBC 4.4 10/21/2018   HGB 13.9 10/21/2018   HCT 42.7 10/21/2018   MCV 89 10/21/2018   PLT 227 10/21/2018    Results for orders placed or performed in visit on 10/28/18  POCT INR  Result Value Ref Range   INR         Assessment:     ICD-10-CM   1. Paroxysmal A-fib ([place])  I48.0     Plan:   Discussed current plan face-to-face with patient. For coumadin dosing, elected to change dose to5mg  daily, she checks her INR Thursday- we will get it tomorrow, but that's too soon to adjust- so will talk again next Friday. There was some confusion regarding her pills- she thinks she  should be taking her pink pill- unclear if she means pink (1mg ) or peach (5mg ). We will get her to bring her pills in tomorrow and will get her on the correct medication.     . This visit was completed via telephone due to the restrictions of the COVID-19 pandemic. All issues as above were discussed and addressed but no physical exam was performed. If it was felt that the patient should be evaluated in the office, they were directed there. The patient verbally consented to this visit. Patient was unable to complete an audio/visual visit due to Lack of equipment. Due to the catastrophic nature of the COVID-19 pandemic, this visit was done through audio contact only. . Location of the patient: home . Location of the provider: home . Those involved with this call:  . Provider: Park Liter, DO . CMA: Tiffany Reel, CMA . Front Desk/Registration: Don Perking  . Time spent on call: 15 minutes on the phone discussing health concerns. 23 minutes total spent in review of patient's record and preparation of their chart.

## 2018-11-25 ENCOUNTER — Telehealth: Payer: Self-pay | Admitting: *Deleted

## 2018-11-25 LAB — POCT INR: INR: 1.4 — AB (ref 2.0–3.0)

## 2018-11-25 NOTE — Telephone Encounter (Signed)
Janett Billow called from [facility] with a INR result of 1.4 for this patient. Dr. Wynetta Emery notified of lab results.

## 2018-11-26 ENCOUNTER — Other Ambulatory Visit: Payer: Self-pay | Admitting: Family Medicine

## 2018-11-26 MED ORDER — WARFARIN SODIUM 5 MG PO TABS: 5.0000 mg | ORAL_TABLET | Freq: Every day | ORAL | 3 refills | Status: DC

## 2018-11-26 NOTE — Telephone Encounter (Signed)
Patient did not bring her meds in yesterday- can we please get her to come in with her medication so we can make sure of what she's taking?

## 2018-11-26 NOTE — Telephone Encounter (Signed)
Pt coming at [facility].

## 2018-11-26 NOTE — Telephone Encounter (Signed)
Pt called and is requesting to know what time to come in today or if it can wait. Please advise .

## 2018-12-02 ENCOUNTER — Ambulatory Visit: Payer: Medicare HMO | Admitting: Family Medicine

## 2018-12-03 ENCOUNTER — Encounter: Payer: Self-pay | Admitting: Family Medicine

## 2018-12-03 ENCOUNTER — Other Ambulatory Visit: Payer: Self-pay

## 2018-12-03 ENCOUNTER — Ambulatory Visit (INDEPENDENT_AMBULATORY_CARE_PROVIDER_SITE_OTHER): Payer: Medicare HMO | Admitting: Family Medicine

## 2018-12-03 DIAGNOSIS — I480 Paroxysmal atrial fibrillation: Secondary | ICD-10-CM

## 2018-12-03 LAB — COAGUCHEK XS/INR WAIVED
INR: 1.4 — ABNORMAL HIGH (ref 0.9–1.1)
Prothrombin Time: 16.8 s

## 2018-12-03 NOTE — Progress Notes (Signed)
   LMP  (LMP Unknown)    Subjective:    Patient ID: Daisy Watkins, female    DOB: 1932-08-07, 85 y.o.   MRN: [id number]  CC: Coumadin management  HPI: This patient is a 85 y.o. female who presents for coumadin management. The expected duration of coumadin treatment is lifelong The reason for anticoagulation is  A. Fib.  Present Coumadin dose: Supposed to be taking 5mg  daily- unclear if she's been taking 5mg  or 1mg , she initally said that she was taking the "pink pill" (1mg )- we took her 1mg  bottle on Tuesday from her and threw it out. She then said she was taking the "yellow pill", likely the "peach pill" 5mg  tablet. She states that she does not have any pills in pill boxes and only has her medicine in the bottles. She then agreed that she couldn't be taking her pink pill because we took them away. Goal: 2.0-3.0  Excessive bruising: no Nose bleeding: no Rectal bleeding: no Prolonged menstrual cycles: N/A Eating diet with consistent amounts of foods containing Vitamin K:yes Any recent antibiotic use? no  Relevant past medical, surgical, family and social history reviewed and updated as indicated. Interim medical history since our last visit reviewed. Allergies and medications reviewed and updated.  ROS: Per HPI unless specifically indicated above     Objective:    LMP  (LMP Unknown)   Wt Readings from Last 3 Encounters:  11/04/18 200 lb (90.7 kg)  10/21/18 203 lb (92.1 kg)  10/08/18 203 lb (92.1 kg)     General: Well appearing, well nourished in no distress.  Normal mood and affect. Skin: No excessive bruising or rash  Last INR: 1.4 Last PT: 16.8    Last CBC:  Lab Results  Component Value Date   WBC 4.4 10/21/2018   HGB 13.9 10/21/2018   HCT 42.7 10/21/2018   MCV 89 10/21/2018   PLT 227 10/21/2018    Results for orders placed or performed in visit on 12/03/18  CoaguChek XS/INR Waived  Result Value Ref Range   INR 1.4 (H) 0.9 - 1.1   Prothrombin Time 16.8 sec     Assessment:     ICD-10-CM   1. Paroxysmal A-fib ([facility])  I48.0 CoaguChek XS/INR Waived    Referral to Chronic Care Management Services    Plan:   Discussed current plan face-to-face with patient. For coumadin dosing, elected to change dose to 6mg  daily. Will plan to recheck INR on Tuesday at 1:30 in office.   . This visit was completed via telephone due to the restrictions of the COVID-19 pandemic. All issues as above were discussed and addressed but no physical exam was performed. If it was felt that the patient should be evaluated in the office, they were directed there. The patient verbally consented to this visit. Patient was unable to complete an audio/visual visit due to Lack of equipment. Due to the catastrophic nature of the COVID-19 pandemic, this visit was done through audio contact only. . Location of the patient: home . Location of the provider: home . Those involved with this call:  . Provider: Park Liter, DO . CMA: Tiffany Reel, CMA . Front Desk/Registration: Don Perking  . Time spent on call: 15 minutes on the phone discussing health concerns. 23 minutes total spent in review of patient's record and preparation of their chart.

## 2018-12-07 ENCOUNTER — Ambulatory Visit: Payer: Medicare HMO | Admitting: Family Medicine

## 2018-12-10 ENCOUNTER — Ambulatory Visit (INDEPENDENT_AMBULATORY_CARE_PROVIDER_SITE_OTHER): Payer: Medicare HMO | Admitting: Family Medicine

## 2018-12-10 ENCOUNTER — Other Ambulatory Visit: Payer: Self-pay

## 2018-12-10 ENCOUNTER — Ambulatory Visit: Payer: Self-pay | Admitting: Family Medicine

## 2018-12-10 ENCOUNTER — Encounter: Payer: Self-pay | Admitting: Family Medicine

## 2018-12-10 VITALS — BP 159/98 | HR 74 | Temp 98.5°F

## 2018-12-10 DIAGNOSIS — Z23 Encounter for immunization: Secondary | ICD-10-CM

## 2018-12-10 DIAGNOSIS — I480 Paroxysmal atrial fibrillation: Secondary | ICD-10-CM | POA: Diagnosis not present

## 2018-12-10 LAB — COAGUCHEK XS/INR WAIVED
INR: 1.8 — ABNORMAL HIGH (ref 0.9–1.1)
Prothrombin Time: 21.4 s

## 2018-12-10 MED ORDER — WARFARIN SODIUM 7.5 MG PO TABS: 7.5000 mg | ORAL_TABLET | Freq: Every day | ORAL | 3 refills | Status: DC

## 2018-12-10 NOTE — Progress Notes (Signed)
   BP (!) 159/98   Pulse 74   Temp 98.5 F (36.9 C)   LMP  (LMP Unknown)   SpO2 98%    Subjective:    Patient ID: Daisy Watkins, female    DOB: 12-Aug-1932, 85 y.o.   MRN: [id number]  CC: Coumadin management  HPI: This patient is a 85 y.o. female who presents for coumadin management. The expected duration of coumadin treatment is lifelong The reason for anticoagulation is  A. Fib.  Present Coumadin dose: 6mg  daily Goal: 2.0-3.0  Excessive bruising: no Nose bleeding: no Rectal bleeding: no Prolonged menstrual cycles: N/A Eating diet with consistent amounts of foods containing Vitamin K:yes Any recent antibiotic use? no  Relevant past medical, surgical, family and social history reviewed and updated as indicated. Interim medical history since our last visit reviewed. Allergies and medications reviewed and updated.  ROS: Per HPI unless specifically indicated above     Objective:    BP (!) 159/98   Pulse 74   Temp 98.5 F (36.9 C)   LMP  (LMP Unknown)   SpO2 98%   Wt Readings from Last 3 Encounters:  11/04/18 200 lb (90.7 kg)  10/21/18 203 lb (92.1 kg)  10/08/18 203 lb (92.1 kg)     General: Well appearing, well nourished in no distress.  Normal mood and affect. Skin: No excessive bruising or rash  Last INR: 1.8 Last PT: 21.4    Last CBC:  Lab Results  Component Value Date   WBC 4.4 10/21/2018   HGB 13.9 10/21/2018   HCT 42.7 10/21/2018   MCV 89 10/21/2018   PLT 227 10/21/2018    Results for orders placed or performed in visit on 12/03/18  CoaguChek XS/INR Waived  Result Value Ref Range   INR 1.4 (H) 0.9 - 1.1   Prothrombin Time 16.8 sec       Assessment:     ICD-10-CM   1. Paroxysmal A-fib ([facility])  I48.0 CoaguChek XS/INR Waived  2. Immunization due  Z23 Flu Vaccine QUAD High Dose(Fluad)   Flu shot given today.    Plan:   Discussed current plan face-to-face with patient. For coumadin dosing, elected to increase dose to 7.5mg  daily . Will plan to  recheck INR in 1 week.

## 2018-12-16 ENCOUNTER — Ambulatory Visit (INDEPENDENT_AMBULATORY_CARE_PROVIDER_SITE_OTHER): Payer: Medicare HMO | Admitting: Family Medicine

## 2018-12-16 ENCOUNTER — Other Ambulatory Visit: Payer: Self-pay

## 2018-12-16 ENCOUNTER — Encounter: Payer: Self-pay | Admitting: Family Medicine

## 2018-12-16 VITALS — BP 153/91 | HR 69 | Temp 98.5°F

## 2018-12-16 DIAGNOSIS — I480 Paroxysmal atrial fibrillation: Secondary | ICD-10-CM

## 2018-12-16 LAB — COAGUCHEK XS/INR WAIVED
INR: 5.1 (ref 0.9–1.1)
Prothrombin Time: 60.9 s

## 2018-12-16 NOTE — Progress Notes (Signed)
   BP (!) 153/91   Pulse 69   Temp 98.5 F (36.9 C)   LMP  (LMP Unknown)   SpO2 98%    Subjective:    Patient ID: Daisy Watkins, female    DOB: 1932-09-22, 85 y.o.   MRN: [id number]  CC: Coumadin management  HPI: This patient is a 85 y.o. female who presents for coumadin management. The expected duration of coumadin treatment is lifelong The reason for anticoagulation is  A. Fib.  Present Coumadin dose: 7.5mg  daily Goal: 2.0-3.0  Excessive bruising: no Nose bleeding: no Rectal bleeding: no Prolonged menstrual cycles: N/A Eating diet with consistent amounts of foods containing Vitamin K:yes Any recent antibiotic use? no  Relevant past medical, surgical, family and social history reviewed and updated as indicated. Interim medical history since our last visit reviewed. Allergies and medications reviewed and updated.  ROS: Per HPI unless specifically indicated above     Objective:    BP (!) 153/91   Pulse 69   Temp 98.5 F (36.9 C)   LMP  (LMP Unknown)   SpO2 98%   Wt Readings from Last 3 Encounters:  11/04/18 200 lb (90.7 kg)  10/21/18 203 lb (92.1 kg)  10/08/18 203 lb (92.1 kg)     General: Well appearing, well nourished in no distress.  Normal mood and affect. Skin: No excessive bruising or rash  Last INR: 5.1 Last PT: 60.9    Last CBC:  Lab Results  Component Value Date   WBC 4.4 10/21/2018   HGB 13.9 10/21/2018   HCT 42.7 10/21/2018   MCV 89 10/21/2018   PLT 227 10/21/2018    Results for orders placed or performed in visit on 12/10/18  CoaguChek XS/INR Waived  Result Value Ref Range   INR 1.8 (H) 0.9 - 1.1   Prothrombin Time 21.4 sec       Assessment:     ICD-10-CM   1. Paroxysmal A-fib ([facility])  I48.0 CoaguChek XS/INR Waived    Plan:   Discussed current plan face-to-face with patient. For coumadin dosing, elected to hold dose for 3 days and then restart 6mg . Will plan to recheck INR in 1 week.

## 2018-12-23 ENCOUNTER — Ambulatory Visit: Payer: Medicare HMO | Admitting: Family Medicine

## 2018-12-25 ENCOUNTER — Other Ambulatory Visit: Payer: Self-pay | Admitting: Family Medicine

## 2018-12-25 DIAGNOSIS — I10 Essential (primary) hypertension: Secondary | ICD-10-CM

## 2018-12-28 ENCOUNTER — Other Ambulatory Visit: Payer: Self-pay | Admitting: Family Medicine

## 2018-12-29 ENCOUNTER — Ambulatory Visit: Payer: Medicare HMO | Admitting: Family Medicine

## 2018-12-30 ENCOUNTER — Encounter: Payer: Self-pay | Admitting: Family Medicine

## 2018-12-30 DIAGNOSIS — Z7901 Long term (current) use of anticoagulants: Secondary | ICD-10-CM | POA: Diagnosis not present

## 2018-12-30 DIAGNOSIS — I480 Paroxysmal atrial fibrillation: Secondary | ICD-10-CM | POA: Diagnosis not present

## 2018-12-30 LAB — POCT INR

## 2018-12-31 ENCOUNTER — Ambulatory Visit: Payer: Medicare HMO | Admitting: Family Medicine

## 2018-12-31 ENCOUNTER — Telehealth: Payer: Self-pay | Admitting: Family Medicine

## 2018-12-31 NOTE — Telephone Encounter (Signed)
Pt called to reschedule due to oversleeping, however she wanted to inform the provider that her inr was 2.4.

## 2019-01-03 ENCOUNTER — Other Ambulatory Visit: Payer: Self-pay

## 2019-01-03 ENCOUNTER — Ambulatory Visit (INDEPENDENT_AMBULATORY_CARE_PROVIDER_SITE_OTHER): Payer: Medicare HMO | Admitting: Family Medicine

## 2019-01-03 ENCOUNTER — Encounter: Payer: Self-pay | Admitting: Family Medicine

## 2019-01-03 VITALS — BP 151/85 | HR 69 | Temp 98.1°F | Wt 204.0 lb

## 2019-01-03 DIAGNOSIS — N3000 Acute cystitis without hematuria: Secondary | ICD-10-CM | POA: Diagnosis not present

## 2019-01-03 DIAGNOSIS — R350 Frequency of micturition: Secondary | ICD-10-CM | POA: Diagnosis not present

## 2019-01-03 DIAGNOSIS — I480 Paroxysmal atrial fibrillation: Secondary | ICD-10-CM

## 2019-01-03 LAB — WET PREP FOR TRICH, YEAST, CLUE
Clue Cell Exam: NEGATIVE
Trichomonas Exam: NEGATIVE
Yeast Exam: NEGATIVE

## 2019-01-03 LAB — COAGUCHEK XS/INR WAIVED
INR: 2.6 — ABNORMAL HIGH (ref 0.9–1.1)
Prothrombin Time: 30.8 s

## 2019-01-03 MED ORDER — NITROFURANTOIN MONOHYD MACRO 100 MG PO CAPS: 100.0000 mg | ORAL_CAPSULE | Freq: Two times a day (BID) | ORAL | 0 refills | Status: DC

## 2019-01-03 NOTE — Progress Notes (Signed)
BP (!) 151/85   Pulse 69   Temp 98.1 F (36.7 C) (Oral)   Wt 204 lb (92.5 kg)   LMP  (LMP Unknown)   SpO2 95%   BMI 37.31 kg/m    Subjective:    Patient ID: Daisy Watkins, female    DOB: Sep 09, 1932, 85 y.o.   MRN: [id number]  HPI: Daisy Watkins is a 85 y.o. female  Chief Complaint  Patient presents with  . Coagulation Disorder  . Urinary Frequency    burning and itchy urination x about a week   Coumadin Management.  The expected duration of coumadin treatment is lifelong The reason for anticoagulation is  A. Fib.  Present Coumadin dose: 7.5mg  daily Goal: 2.0-3.0  Excessive bruising: no Nose bleeding: no Rectal bleeding: no Prolonged menstrual cycles: N/A Eating diet with consistent amounts of foods containing Vitamin K:yes Any recent antibiotic use? no  URINARY SYMPTOMS Duration: 2 weeks Dysuria: burning Urinary frequency: yes Urgency: yes Small volume voids: no Symptom severity: moderate Urinary incontinence: no Foul odor: yes Hematuria: no Abdominal pain: no Back pain: yes Suprapubic pain/pressure: yes Flank pain: no Fever:  no Vomiting: no Relief with cranberry juice: no Relief with pyridium: no Status: worse Previous urinary tract infection: yes Recurrent urinary tract infection: no Sexual activity: No sexually active/monogomous/practicing safe sex History of sexually transmitted disease: no Penile discharge: no Treatments attempted: increasing fluids   Relevant past medical, surgical, family and social history reviewed and updated as indicated. Interim medical history since our last visit reviewed. Allergies and medications reviewed and updated.  Review of Systems  Constitutional: Negative.   Cardiovascular: Negative.   Gastrointestinal: Negative.   Genitourinary: Positive for dysuria, frequency and urgency. Negative for decreased urine volume, difficulty urinating, dyspareunia, enuresis, flank pain, genital sores, hematuria, menstrual problem,  pelvic pain, vaginal bleeding, vaginal discharge and vaginal pain.  Musculoskeletal: Negative.   Psychiatric/Behavioral: Negative.     Per HPI unless specifically indicated above     Objective:    BP (!) 151/85   Pulse 69   Temp 98.1 F (36.7 C) (Oral)   Wt 204 lb (92.5 kg)   LMP  (LMP Unknown)   SpO2 95%   BMI 37.31 kg/m   Wt Readings from Last 3 Encounters:  01/03/19 204 lb (92.5 kg)  11/04/18 200 lb (90.7 kg)  10/21/18 203 lb (92.1 kg)    Physical Exam Vitals signs and nursing note reviewed.  Constitutional:      General: She is not in acute distress.    Appearance: Normal appearance. She is not ill-appearing, toxic-appearing or diaphoretic.  HENT:     Head: Normocephalic and atraumatic.     Right Ear: External ear normal.     Left Ear: External ear normal.     Nose: Nose normal.     Mouth/Throat:     Mouth: Mucous membranes are moist.     Pharynx: Oropharynx is clear.  Eyes:     General: No scleral icterus.       Right eye: No discharge.        Left eye: No discharge.     Extraocular Movements: Extraocular movements intact.     Conjunctiva/sclera: Conjunctivae normal.     Pupils: Pupils are equal, round, and reactive to light.  Neck:     Musculoskeletal: Normal range of motion and neck supple.  Cardiovascular:     Rate and Rhythm: Normal rate and regular rhythm.     Pulses: Normal pulses.  Heart sounds: Normal heart sounds. No murmur. No friction rub. No gallop.   Pulmonary:     Effort: Pulmonary effort is normal. No respiratory distress.     Breath sounds: Normal breath sounds. No stridor. No wheezing, rhonchi or rales.  Chest:     Chest wall: No tenderness.  Musculoskeletal: Normal range of motion.  Skin:    General: Skin is warm and dry.     Capillary Refill: Capillary refill takes less than 2 seconds.     Coloration: Skin is not jaundiced or pale.     Findings: No bruising, erythema, lesion or rash.  Neurological:     General: No focal deficit  present.     Mental Status: She is alert and oriented to person, place, and time. Mental status is at baseline.  Psychiatric:        Mood and Affect: Mood normal.        Behavior: Behavior normal.        Thought Content: Thought content normal.        Judgment: Judgment normal.     Results for orders placed or performed in visit on 12/16/18  CoaguChek XS/INR Waived  Result Value Ref Range   INR 5.1 (HH) 0.9 - 1.1   Prothrombin Time 60.9 sec      Assessment & Plan:   Problem List Items Addressed This Visit      Cardiovascular and Mediastinum   Paroxysmal A-fib (HCC)    INR 2.6. Continue 7.5 daily- recheck 3 weeks. Call with any concerns.       Relevant Orders   CoaguChek XS/INR Waived    Other Visit Diagnoses    Acute cystitis without hematuria    -  Primary   Will treat with nitrofurantoin. Call with any concerns or if not getting better. Continue to monitor.    Urinary frequency       1+ leuks- will treat for UTI.    Relevant Orders   UA/M w/rflx Culture, Routine   WET PREP FOR [place], YEAST, CLUE       Follow up plan: Return in about 3 weeks (around 01/24/2019).

## 2019-01-03 NOTE — Assessment & Plan Note (Signed)
INR 2.6. Continue 7.5 daily- recheck 3 weeks. Call with any concerns.

## 2019-01-05 ENCOUNTER — Telehealth: Payer: Self-pay | Admitting: Family Medicine

## 2019-01-05 LAB — UA/M W/RFLX CULTURE, ROUTINE
Bilirubin, UA: NEGATIVE
Glucose, UA: NEGATIVE
Ketones, UA: NEGATIVE
Nitrite, UA: NEGATIVE
Protein,UA: NEGATIVE
RBC, UA: NEGATIVE
Specific Gravity, UA: 1.015 (ref 1.005–1.030)
Urobilinogen, Ur: 1 mg/dL (ref 0.2–1.0)
pH, UA: 5.5 (ref 5.0–7.5)

## 2019-01-05 LAB — MICROSCOPIC EXAMINATION: RBC, Urine: NONE SEEN /hpf (ref 0–2)

## 2019-01-05 LAB — URINE CULTURE, REFLEX

## 2019-01-05 NOTE — Telephone Encounter (Signed)
Patient notified

## 2019-01-05 NOTE — Telephone Encounter (Signed)
Please let her know that her urine came back clear- so she can stop the antibiotic. Thanks!

## 2019-01-06 ENCOUNTER — Other Ambulatory Visit: Payer: Self-pay | Admitting: Family Medicine

## 2019-01-06 ENCOUNTER — Other Ambulatory Visit: Payer: Self-pay | Admitting: Unknown Physician Specialty

## 2019-01-06 LAB — POCT INR

## 2019-01-11 ENCOUNTER — Ambulatory Visit (INDEPENDENT_AMBULATORY_CARE_PROVIDER_SITE_OTHER): Payer: Medicare HMO | Admitting: Pharmacist

## 2019-01-11 DIAGNOSIS — E1165 Type 2 diabetes mellitus with hyperglycemia: Secondary | ICD-10-CM | POA: Diagnosis not present

## 2019-01-11 DIAGNOSIS — Z794 Long term (current) use of insulin: Secondary | ICD-10-CM

## 2019-01-11 DIAGNOSIS — I480 Paroxysmal atrial fibrillation: Secondary | ICD-10-CM | POA: Diagnosis not present

## 2019-01-11 NOTE — Patient Instructions (Addendum)
Visit Information  Goals Addressed            This Visit's Progress     Patient Stated   . PharmD "My blood sugars are good" (pt-stated)       Current Barriers:  . Diabetes: uncontrolled; most recent A1c 8.6%; likely recommend goal <8% d/t age, comorbidities  o Reviewed chart; patient approved for Medicare Extra Help in 2018; all generic medications are $3.60/90 day supply, all brand $8.95/90 day supply . Current antihyperglycemic regimen: Insulin 70/30, 45 units QAM, 30 units QPM; notes she is not taking glipizide or Jardiance because "my sugars are good" . Reports episodes of being dizzy/shaky if she doesn't eat enough; would happen in the late morning  . Current blood glucose readings: Has Fastings 120-130s; later in the day, generally in the 200s . Cardiovascular risk reduction: o Current hypertensive regimen: ramipril 15 mg daily, amlodipine 10 mg daily o Current hyperlipidemia regimen: none  Pharmacist Clinical Goal(s):  Marland Kitchen Over the next 90 days, patient will work with PharmD and primary care provider to address optimized medication management  Interventions: . Comprehensive medication review performed, medication list updated in electronic medical record . Discussed concern for hypoglycemia d/t insulin 70/30. Would be extremely hesitant to add back glipizide if A1c not at goal on repeat labwork next week, given patient already having some mid-day hypoglycemia. Pending A1c, consider d/c glipizide, and encourage patient to resume Jardiance, adjusting insulin as needed to prevent hypoglycemia. Could also consider switching to [place] or other basal insulin + Jardiance to cover prandial elevations, to reduce risk of hypoglyceima  Patient Self Care Activities:  . Patient will check blood glucose BID, document, and provide at future appointments . Patient will contact provider with any episodes of hypoglycemia . Patient will report any questions or concerns to provider   Initial goal  documentation     . PharmD "My INR has been difficult to control" (pt-stated)       Current Barriers:  . Polypharmacy; complex patient with multiple comorbidities including atrial fibrillation, T2DM, HTN . Recent confusion regarding warfarin dose noted. Currently prescribed warfarin 7.5 mg once daily, however, patient reports that she is taking a "blue" tablet. Asked her to look at the dose on the bottle, and she notes it is 6 mg (6 mg is a teal, 4 mg is a light blue, 7.5 mg is yellow). Patient notes that she picked this bottle up from the pharmacy last week, confirmed with [facility] that she picked up warfarin 6 mg daily on 01/07/2019 . Historically, had been on Eliquis therapy, then switched to Xarelto because it was though hair loss may have been related to Eliquis. It appears patient never started Xarelto d/t cost, and self-discontinued DOAC therapy to restart warfarin d/t cost (back in 2016; Medicare Extra Help was approved in 2018) . CrCl ~ 44 mL/min using adjusted body weight  Pharmacist Clinical Goal(s):  Marland Kitchen Over the next 90 days, patient will work with PharmD and provider towards optimized medication management  Interventions: . Comprehensive medication review performed; medication list updated in electronic medical record . Encouraged patient to continue the dose she has been taking, as INR was therapeutic on last check. Patient will bring all medications with her to upcoming appointment next week . As patient has been struggling to maintain therapeutic INR, discussed possibly switching back to DOAC therapy. Since she has Medicare Extra Help, cost will be $8.95/90 day supply. Patient's CrCl using adjusted BW would require a dose reduction of  Xarelto to 15 mg daily, however, she does not meet criteria for dose reduction of Eliquis. Recommend switching back to Eliquis 5 mg BID. Will plan to discuss w/ Dr. Wynetta Emery at patient's upcoming appointment   Patient Self Care Activities:  . Patient  will take medications as prescribed  Initial goal documentation        The patient verbalized understanding of instructions provided today and declined a print copy of patient instruction materials.     Plan: - Will meet with patient face to face next week with Dr. Durenda Age appointment  Catie Darnelle Maffucci, PharmD Clinical Pharmacist [place] [phone]

## 2019-01-11 NOTE — Chronic Care Management (AMB) (Signed)
Chronic Care Management   Note  01/11/2019 Name: Daisy Watkins MRN: [id number] DOB: 08-24-1932   Subjective:  Daisy Watkins is a 85 y.o. year old female who is a primary care patient of Valerie Roys, DO. The CCM team was consulted for assistance with chronic disease management and care coordination needs.    Contacted patient for medication management support today.   Ms. Garguilo was given information about Chronic Care Management services today including:  1. CCM service includes personalized support from designated clinical staff supervised by her physician, including individualized plan of care and coordination with other care providers 2. 24/7 contact phone numbers for assistance for urgent and routine care needs. 3. Service will only be billed when office clinical staff spend 20 minutes or more in a month to coordinate care. 4. Only one practitioner may furnish and bill the service in a calendar month. 5. The patient may stop CCM services at any time (effective at the end of the month) by phone call to the office staff. 6. The patient will be responsible for cost sharing (co-pay) of up to 20% of the service fee (after annual deductible is met).  Patient agreed to services and verbal consent obtained.   Review of patient status, including review of consultants reports, laboratory and other test data, was performed as part of comprehensive evaluation and provision of chronic care management services.   Objective:  Lab Results  Component Value Date   CREATININE 0.69 10/21/2018   CREATININE 0.86 12/15/2017   CREATININE 0.81 10/26/2017    Lab Results  Component Value Date   HGBA1C 8.6 (H) 10/21/2018       Component Value Date/Time   CHOL 234 (H) 10/21/2018 1117   CHOL 256 (H) 07/16/2015 1315   TRIG 252 (H) 10/21/2018 1117   TRIG 280 (H) 07/16/2015 1315   HDL 52 10/21/2018 1117   VLDL 56 (H) 07/16/2015 1315   LDLCALC 132 (H) 10/21/2018 1117    Clinical ASCVD: No    BP Readings from Last 3 Encounters:  01/03/19 (!) 151/85  12/16/18 (!) 153/91  12/10/18 (!) 159/98    Allergies  Allergen Reactions  . Ciprofloxacin Palpitations    Severe Headache  . Clindamycin/Lincomycin Nausea Only    Made her head hurt  . Hctz [Hydrochlorothiazide] Other (See Comments)    Abdominal pain  . Sulfur Itching  . Doxycycline Hyclate Palpitations    Medications Reviewed Today    Reviewed by Valerie Roys, DO (Physician) on 01/03/19 at 1324  Med List Status: <None>  Medication Order Taking? Sig Documenting Provider Last Dose Status Informant  acetaminophen (TYLENOL) 500 MG tablet [id number] Yes Take 500 mg every 6 (six) hours as needed by mouth. As needed [provider] Taking Active            Med Note Veleta Miners, ROSE Jerilynn Mages   Fri Feb 13, 2017  1:38 PM) As needed.   acetic acid-hydrocortisone (VOSOL-HC) OTIC solution [id number] Yes Place 3 drops into both ears 2 (two) times daily. Marnee Guarneri T, NP Taking Active   amLODipine (NORVASC) 10 MG tablet [id number] Yes Take 1 tablet (10 mg total) by mouth daily. Johnson, Megan P, DO Taking Active   azelastine (OPTIVAR) 0.05 % ophthalmic solution [id number] Yes Place 1 drop into both eyes 2 (two) times daily. Volney American, [place] Taking Active   baclofen (LIORESAL) 10 MG tablet [id number] Yes Take 1 tablet (10 mg total) by mouth 3 (three) times daily. Kathrine Haddock,  NP Taking Active   BD INSULIN SYRINGE U/F 31G X 5/16" 0.5 ML MISC [id number] Yes USE DAILY AS DIRECTED FOR DIABETES Kathrine Haddock, NP Taking Active   Cholecalciferol (VITAMIN D3) 1000 units CAPS [id number] Yes Take by mouth. Daily [provider] Taking Active            Med Note Veleta Miners, [place] Dec 30, 2016  2:27 PM) Per pt takes one to twice a week.   empagliflozin (JARDIANCE) 25 MG TABS tablet [id number] Yes Take 25 mg by mouth daily. Park Liter P, DO Taking Active   erythromycin ophthalmic ointment [id number] Yes Place 1  application into both eyes 2 (two) times daily as needed. Volney American, [place] Taking Active   fluticasone [facility]) 50 MCG/ACT nasal spray [id number] Yes Place 2 sprays into both nostrils daily. Johnson, Megan P, DO Taking Active   glipiZIDE (GLIPIZIDE XL) 10 MG 24 hr tablet [id number] Yes Take 1 tablet (10 mg total) by mouth daily with breakfast. Park Liter P, DO Taking Active   insulin NPH-regular Human (NOVOLIN 70/30) (70-30) 100 UNIT/ML injection [id number] Yes INFECT 45 UNITS UNDER THE SKIN EVERY MORNING WITH BREAKFAST AND 30 UNITS BEFORE EVENING MEAL OR AS DIRECTED Johnson, Megan P, DO Taking Active   LORazepam (ATIVAN) 1 MG tablet [id number] Yes Take 1 tablet (1 mg total) by mouth at bedtime as needed. Park Liter P, DO Taking Active   magnesium oxide (MAG-OX) 400 MG tablet [id number] Yes Take by mouth daily.  [provider] Taking Active Self  meloxicam (MOBIC) 15 MG tablet [id number] Yes Take 1 tablet (15 mg total) by mouth daily. Johnson, Megan P, DO Taking Active   nitrofurantoin, macrocrystal-monohydrate, (MACROBID) 100 MG capsule [id number]  Take 1 capsule (100 mg total) by mouth 2 (two) times daily. Johnson, Megan P, DO  Active   ramipril (ALTACE) 10 MG capsule [id number] Yes TAKE ONE CAPSULE BY MOUTH DAILY. TO BE TAKEN WITH HER 5 MG CAPSULE FOR A TOTAL OF 15 MG DAILY Johnson, Megan P, DO Taking Active   ramipril (ALTACE) 5 MG capsule [id number] Yes Take 1 capsule (5 mg total) by mouth daily. To be taken with her 40m cap for a total of 137mdaily JoValerie RoysDO Taking Active   TRUETRACK TEST test strip [id number]  [provider] Taking Active   vitamin C (ASCORBIC ACID) 500 MG tablet [id number] Take 500 mg by mouth daily. [provider] Taking Active            Med Note (PVeleta MinersROSE M   Fri Feb 13, 2017  1:36 PM) Twice a week   warfarin (COUMADIN) 7.5 MG tablet [id number] Take 1 tablet (7.5 mg total) by mouth daily. JoValerie RoysDO Taking Active            Assessment:   Goals Addressed            This Visit's Progress     Patient Stated   . PharmD "My blood sugars are good" (pt-stated)       Current Barriers:  . Diabetes: uncontrolled; most recent A1c 8.6%; likely recommend goal <8% d/t age, comorbidities  o Reviewed chart; patient approved for Medicare Extra Help in 2018; all generic medications are $3.60/90 day supply, all brand $8.95/90 day supply . Current antihyperglycemic regimen: Insulin 70/30, 45 units QAM, 30 units QPM; notes she is not taking glipizide or Jardiance because "my sugars are good" . Reports episodes  of being dizzy/shaky if she doesn't eat enough; would happen in the late morning  . Current blood glucose readings: Has Fastings 120-130s; later in the day, generally in the 200s . Cardiovascular risk reduction: o Current hypertensive regimen: ramipril 15 mg daily, amlodipine 10 mg daily o Current hyperlipidemia regimen: none  Pharmacist Clinical Goal(s):  Marland Kitchen Over the next 90 days, patient will work with PharmD and primary care provider to address optimized medication management  Interventions: . Comprehensive medication review performed, medication list updated in electronic medical record . Discussed concern for hypoglycemia d/t insulin 70/30. Would be extremely hesitant to add back glipizide if A1c not at goal on repeat labwork next week, given patient already having some mid-day hypoglycemia. Pending A1c, consider d/c glipizide, and encourage patient to resume Jardiance, adjusting insulin as needed to prevent hypoglycemia. Could also consider switching to [place] or other basal insulin + Jardiance to cover prandial elevations, to reduce risk of hypoglyceima  Patient Self Care Activities:  . Patient will check blood glucose BID, document, and provide at future appointments . Patient will contact provider with any episodes of hypoglycemia . Patient will report any questions or  concerns to provider   Initial goal documentation     . PharmD "My INR has been difficult to control" (pt-stated)       Current Barriers:  . Polypharmacy; complex patient with multiple comorbidities including atrial fibrillation, T2DM, HTN . Recent confusion regarding warfarin dose noted. Currently prescribed warfarin 7.5 mg once daily, however, patient reports that she is taking a "blue" tablet. Asked her to look at the dose on the bottle, and she notes it is 6 mg (6 mg is a teal, 4 mg is a light blue, 7.5 mg is yellow). Patient notes that she picked this bottle up from the pharmacy last week, confirmed with [facility] that she picked up warfarin 6 mg daily on 01/07/2019 . Historically, had been on Eliquis therapy, then switched to Xarelto because it was though hair loss may have been related to Eliquis. It appears patient never started Xarelto d/t cost, and self-discontinued DOAC therapy to restart warfarin d/t cost (back in 2016; Medicare Extra Help was approved in 2018) . CrCl ~ 44 mL/min using adjusted body weight  Pharmacist Clinical Goal(s):  Marland Kitchen Over the next 90 days, patient will work with PharmD and provider towards optimized medication management  Interventions: . Comprehensive medication review performed; medication list updated in electronic medical record . Encouraged patient to continue the dose she has been taking, as INR was therapeutic on last check. Patient will bring all medications with her to upcoming appointment next week . As patient has been struggling to maintain therapeutic INR, discussed possibly switching back to DOAC therapy. Since she has Medicare Extra Help, cost will be $8.95/90 day supply. Patient's CrCl using adjusted BW would require a dose reduction of Xarelto to 15 mg daily, however, she does not meet criteria for dose reduction of Eliquis. Recommend switching back to Eliquis 5 mg BID. Will plan to discuss w/ Dr. Wynetta Emery at patient's upcoming appointment    Patient Self Care Activities:  . Patient will take medications as prescribed  Initial goal documentation        Plan: - Will meet with patient face to face next week with Dr. Durenda Age appointment  Catie Darnelle Maffucci, PharmD Clinical Pharmacist [place] [phone]

## 2019-01-13 LAB — POCT INR

## 2019-01-14 ENCOUNTER — Other Ambulatory Visit: Payer: Self-pay | Admitting: Family Medicine

## 2019-01-14 NOTE — Telephone Encounter (Signed)
Routing to provider  

## 2019-01-14 NOTE — Telephone Encounter (Signed)
Requested medication (s) are due for refill today: yes  Requested medication (s) are on the active medication list: yes  Last refill:  12/16/2018  Future visit scheduled: yes  Notes to clinic:  Refill cannot be delegated    Requested Prescriptions  Pending Prescriptions Disp Refills   LORazepam (ATIVAN) 1 MG tablet [Pharmacy Med Name: LORAZEPAM 1MG  TABLETS] 30 tablet     Sig: TAKE 1 TABLET(1 MG) BY MOUTH AT BEDTIME AS NEEDED     Not Delegated - Psychiatry:  Anxiolytics/Hypnotics Failed - 01/14/2019  2:07 PM      Failed - This refill cannot be delegated      Failed - Urine Drug Screen completed in last 360 days.      Passed - Valid encounter within last 6 months    Recent Outpatient Visits          1 week ago Acute cystitis without hematuria   [place], Megan P, DO   4 weeks ago Paroxysmal A-fib  [facility])   [place], Megan P, DO   1 month ago Paroxysmal A-fib [facility])   [place], Megan P, DO   1 month ago Paroxysmal A-fib [facility])   [place], Megan P, DO   1 month ago Paroxysmal A-fib [facility])   [place], Barb Merino, DO      Future Appointments            In 1 week Wynetta Emery, Barb Merino, DO [place], [facility]

## 2019-01-14 NOTE — Telephone Encounter (Signed)
Patient calling to check status of this request. She states she is out of medication.

## 2019-01-17 ENCOUNTER — Other Ambulatory Visit: Payer: Self-pay | Admitting: Family Medicine

## 2019-01-17 NOTE — Telephone Encounter (Signed)
Requested medication (s) are due for refill today: yes  Requested medication (s) are on the active medication list: yes  Last refill:  10/15/2018  Future visit scheduled: yes  Notes to clinic:  Refill cannot be delegated    Requested Prescriptions  Pending Prescriptions Disp Refills   LORazepam (ATIVAN) 1 MG tablet [Pharmacy Med Name: LORAZEPAM 1MG  TABLETS] 7 tablet     Sig: TAKE 1 TABLET BY MOUTH THREE TIMES DAILY AS NEEDED(BETWEEN MEALS AND BEDTIME)     Not Delegated - Psychiatry:  Anxiolytics/Hypnotics Failed - 01/17/2019  1:21 PM      Failed - This refill cannot be delegated      Failed - Urine Drug Screen completed in last 360 days.      Passed - Valid encounter within last 6 months    Recent Outpatient Visits          2 weeks ago Acute cystitis without hematuria   [place], Megan P, DO   1 month ago Paroxysmal A-fib [facility])   [place], Megan P, DO   1 month ago Paroxysmal A-fib [facility])   [place], Megan P, DO   1 month ago Paroxysmal A-fib [facility])   [place], Megan P, DO   1 month ago Paroxysmal A-fib [facility])   [place], Barb Merino, DO      Future Appointments            In 4 days Wynetta Emery, Barb Merino, DO [employer], [facility]

## 2019-01-17 NOTE — Telephone Encounter (Signed)
Patient notified

## 2019-01-17 NOTE — Telephone Encounter (Signed)
Routing to provider  

## 2019-01-20 ENCOUNTER — Encounter: Payer: Self-pay | Admitting: Family Medicine

## 2019-01-20 LAB — POCT INR

## 2019-01-21 ENCOUNTER — Ambulatory Visit: Payer: Self-pay | Admitting: Pharmacist

## 2019-01-21 ENCOUNTER — Ambulatory Visit (INDEPENDENT_AMBULATORY_CARE_PROVIDER_SITE_OTHER): Payer: Medicare HMO | Admitting: Family Medicine

## 2019-01-21 ENCOUNTER — Other Ambulatory Visit: Payer: Self-pay

## 2019-01-21 ENCOUNTER — Encounter: Payer: Self-pay | Admitting: Family Medicine

## 2019-01-21 VITALS — BP 177/84 | HR 65 | Temp 98.0°F | Wt 201.0 lb

## 2019-01-21 DIAGNOSIS — Z794 Long term (current) use of insulin: Secondary | ICD-10-CM

## 2019-01-21 DIAGNOSIS — F5101 Primary insomnia: Secondary | ICD-10-CM | POA: Diagnosis not present

## 2019-01-21 DIAGNOSIS — I480 Paroxysmal atrial fibrillation: Secondary | ICD-10-CM

## 2019-01-21 DIAGNOSIS — I10 Essential (primary) hypertension: Secondary | ICD-10-CM

## 2019-01-21 DIAGNOSIS — E1165 Type 2 diabetes mellitus with hyperglycemia: Secondary | ICD-10-CM

## 2019-01-21 LAB — COAGUCHEK XS/INR WAIVED
INR: 2.3 — ABNORMAL HIGH (ref 0.9–1.1)
Prothrombin Time: 27.1 s

## 2019-01-21 LAB — BAYER DCA HB A1C WAIVED: HB A1C (BAYER DCA - WAIVED): 8.1 % — ABNORMAL HIGH (ref <7.0)

## 2019-01-21 MED ORDER — OMEPRAZOLE 40 MG PO CPDR: 40.0000 mg | DELAYED_RELEASE_CAPSULE | Freq: Every day | ORAL | 1 refills | Status: DC

## 2019-01-21 MED ORDER — JARDIANCE 25 MG PO TABS: 25.0000 mg | ORAL_TABLET | Freq: Every day | ORAL | 3 refills | Status: DC

## 2019-01-21 MED ORDER — AMLODIPINE BESYLATE 10 MG PO TABS: 10.0000 mg | ORAL_TABLET | Freq: Every day | ORAL | 1 refills | Status: DC

## 2019-01-21 MED ORDER — RAMIPRIL 10 MG PO CAPS: ORAL_CAPSULE | ORAL | 1 refills | Status: DC

## 2019-01-21 MED ORDER — MELOXICAM 15 MG PO TABS: 15.0000 mg | ORAL_TABLET | Freq: Every day | ORAL | 3 refills | Status: DC

## 2019-01-21 MED ORDER — LORAZEPAM 1 MG PO TABS: 1.0000 mg | ORAL_TABLET | Freq: Every evening | ORAL | 2 refills | Status: DC | PRN

## 2019-01-21 MED ORDER — APIXABAN 5 MG PO TABS: 5.0000 mg | ORAL_TABLET | Freq: Two times a day (BID) | ORAL | 3 refills | Status: DC

## 2019-01-21 MED ORDER — RAMIPRIL 5 MG PO CAPS: 5.0000 mg | ORAL_CAPSULE | Freq: Every day | ORAL | 1 refills | Status: DC

## 2019-01-21 MED ORDER — TRESIBA 100 UNIT/ML ~~LOC~~ SOLN: 20.0000 [IU] | Freq: Every day | SUBCUTANEOUS | 3 refills | Status: DC

## 2019-01-21 MED ORDER — FLUTICASONE PROPIONATE 50 MCG/ACT NA SUSP: 2.0000 | Freq: Every day | NASAL | 12 refills | Status: DC

## 2019-01-21 NOTE — Assessment & Plan Note (Signed)
Willing to start eliquis. Will continue coumadin until she gets her Rx and then change over not taking the 2 together. Recheck tolerance 1 month. Call with any concerns.

## 2019-01-21 NOTE — Chronic Care Management (AMB) (Signed)
Chronic Care Management   Follow Up Note   01/21/2019 Name: Daisy Watkins MRN: [id number] DOB: 05/28/1932  Referred by: Valerie Roys, DO Reason for referral : Chronic Care Management (Medication Management)   TAM SAVOIA is a 85 y.o. year old female who is a primary care patient of Valerie Roys, DO. The CCM team was consulted for assistance with chronic disease management and care coordination needs.    Met with patient face to face prior to PCP appointment.   Review of patient status, including review of consultants reports, relevant laboratory and other test results, and collaboration with appropriate care team members and the patient's provider was performed as part of comprehensive patient evaluation and provision of chronic care management services.    SDOH (Social Determinants of Health) screening performed today: Financial Strain . See Care Plan for related entries.   Advanced Directives Status: N See Care Plan and Vynca application for related entries.  Outpatient Encounter Medications as of 01/21/2019  Medication Sig Note  . acetaminophen (TYLENOL) 500 MG tablet Take 500 mg every 6 (six) hours as needed by mouth. As needed 02/13/2017: As needed.   Marland Kitchen acetic acid-hydrocortisone (VOSOL-HC) OTIC solution Place 3 drops into both ears 2 (two) times daily.   Marland Kitchen azelastine (OPTIVAR) 0.05 % ophthalmic solution Place 1 drop into both eyes 2 (two) times daily.   . Cholecalciferol (VITAMIN D3) 1000 units CAPS Take by mouth. Daily 12/30/2016: Per pt takes one to twice a week.   . erythromycin ophthalmic ointment Place 1 application into both eyes 2 (two) times daily as needed.   . Insulin Syringe-Needle U-100 (INSULIN SYRINGE .5CC/31GX5/16") 31G X 5/16" 0.5 ML MISC USE DAILY AS DIRECTED FOR DIABETES   . magnesium oxide (MAG-OX) 400 MG tablet Take by mouth daily.    Angelia Mould TEST test strip    . vitamin C (ASCORBIC ACID) 500 MG tablet Take 500 mg by mouth daily. 02/13/2017: Twice  a week   . warfarin (COUMADIN) 7.5 MG tablet Take 1 tablet (7.5 mg total) by mouth daily. 01/11/2019: Taking 6 mg daily; confirmed with pharmacy  . [DISCONTINUED] amLODipine (NORVASC) 10 MG tablet Take 1 tablet (10 mg total) by mouth daily.   . [DISCONTINUED] baclofen (LIORESAL) 10 MG tablet Take 1 tablet (10 mg total) by mouth 3 (three) times daily.   . [DISCONTINUED] empagliflozin (JARDIANCE) 25 MG TABS tablet Take 25 mg by mouth daily. (Patient not taking: Reported on 01/21/2019)   . [DISCONTINUED] fluticasone (FLONASE) 50 MCG/ACT nasal spray Place 2 sprays into both nostrils daily.   . [DISCONTINUED] glipiZIDE (GLIPIZIDE XL) 10 MG 24 hr tablet Take 1 tablet (10 mg total) by mouth daily with breakfast. (Patient not taking: Reported on 01/21/2019)   . [DISCONTINUED] insulin NPH-regular Human (NOVOLIN 70/30) (70-30) 100 UNIT/ML injection INFECT 45 UNITS UNDER THE SKIN EVERY MORNING WITH BREAKFAST AND 30 UNITS BEFORE EVENING MEAL OR AS DIRECTED   . [DISCONTINUED] LORazepam (ATIVAN) 1 MG tablet Take 1 tablet (1 mg total) by mouth at bedtime as needed.   . [DISCONTINUED] meloxicam (MOBIC) 15 MG tablet Take 1 tablet (15 mg total) by mouth daily.   . [DISCONTINUED] nitrofurantoin, macrocrystal-monohydrate, (MACROBID) 100 MG capsule Take 1 capsule (100 mg total) by mouth 2 (two) times daily.   . [DISCONTINUED] omeprazole (PRILOSEC) 40 MG capsule TAKE ONE CAPSULE BY MOUTH EVERY DAY   . [DISCONTINUED] ramipril (ALTACE) 10 MG capsule TAKE ONE CAPSULE BY MOUTH DAILY. TO BE TAKEN WITH HER 5  MG CAPSULE FOR A TOTAL OF 15 MG DAILY   . [DISCONTINUED] ramipril (ALTACE) 5 MG capsule Take 1 capsule (5 mg total) by mouth daily. To be taken with her '10mg'$  cap for a total of '15mg'$  daily    No facility-administered encounter medications on file as of 01/21/2019.      Goals Addressed            This Visit's Progress     Patient Stated   . PharmD "My blood sugars are good" (pt-stated)       Current Barriers:  .  Diabetes: uncontrolled; most recent A1c 8.1%; likely recommend goal <8% d/t age, comorbidities  o Reviewed chart; patient approved for Medicare Extra Help in 2018; all generic medications are $3.60/90 day supply, all brand $8.95/90 day supply . Current antihyperglycemic regimen: Insulin 70/30, 45 units QAM, 30 units QPM; notes she is not taking glipizide or Jardiance . Does report episodes of symptomatic hypoglycemia if she doesn't eat . Current blood glucose readings: [place] 120-130s; later in the day, generally in the 200s . Cardiovascular risk reduction: o Current hypertensive regimen: ramipril 15 mg daily, amlodipine 10 mg daily o Current hyperlipidemia regimen: none  Pharmacist Clinical Goal(s):  Marland Kitchen Over the next 90 days, patient will work with PharmD and primary care provider to address optimized medication management  Interventions: . Collaborated w/ Dr. Wynetta Emery. Recommend d/c glipizide, encourage patient to reinitiate Jardiance, and switch insulin 70/30 to true basal insulin to prevent hypoglycemia. Basal insulin and Jardiance should be $8.95/90 day supply, as patient as Medicare Extra Help . Counseled patient on goals to prevent hypoglycemia.  . Encouraged to fill 90 day supplies for better value   Patient Self Care Activities:  . Patient will check blood glucose BID, document, and provide at future appointments . Patient will contact provider with any episodes of hypoglycemia . Patient will report any questions or concerns to provider   Please see past updates related to this goal by clicking on the "Past Updates" button in the selected goal      . PharmD "My INR has been difficult to control" (pt-stated)       Current Barriers:  . Polypharmacy; complex patient with multiple comorbidities including atrial fibrillation, T2DM, HTN . Recent confusion regarding warfarin dose noted. Therapeutic today at 2.3.  . Previously on DOAC therapy; d/c d/t cost concerns. Patient now has  Medicare Extra Help, DOAC would be $8.95/90 day supply . CrCl ~ 44 mL/min using adjusted body weight  Pharmacist Clinical Goal(s):  Marland Kitchen Over the next 90 days, patient will work with PharmD and provider towards optimized medication management  Interventions: . Comprehensive medication review performed; medication list updated in electronic medical record . Collaborated w/ Dr. Wynetta Emery. Recommend d/c warfarin and switch to Eliquis.   Patient Self Care Activities:  . Patient will take medications as prescribed  Please see past updates related to this goal by clicking on the "Past Updates" button in the selected goal         Plan:  - Will outreach patient in 2-3 weeks to follow up on medication changes made by Dr. Wynetta Emery today  Courtney Heys, PharmD Clinical Pharmacist [place] [phone]

## 2019-01-21 NOTE — Assessment & Plan Note (Signed)
Under good control on current regimen. Continue current regimen. Continue to monitor. Call with any concerns. Refills given for 3 months. Follow up 3 months.    

## 2019-01-21 NOTE — Progress Notes (Signed)
BP (!) 177/84   Pulse 65   Temp 98 F (36.7 C) (Oral)   Wt 201 lb (91.2 kg)   LMP  (LMP Unknown)   SpO2 97%   BMI 36.76 kg/m    Subjective:    Patient ID: Daisy Watkins, female    DOB: 1933-02-19, 85 y.o.   MRN: [id number]  HPI: Daisy Watkins is a 85 y.o. female  Chief Complaint  Patient presents with  . Diabetes  . Coagulation Disorder  . Insomnia   DIABETES Hypoglycemic episodes:no Polydipsia/polyuria: yes Visual disturbance: no Chest pain: no Paresthesias: no Glucose Monitoring: yes  Accucheck frequency: Daily Taking Insulin?: yes Blood Pressure Monitoring: not checking Retinal Examination: Not up to Date Foot Exam: Up to Date Diabetic Education: Completed Pneumovax: Up to Date Influenza: Up to Date Aspirin: no  INSOMNIA Duration: chronic Satisfied with sleep quality: yes Difficulty falling asleep: yes Difficulty staying asleep: no Waking a few hours after sleep onset: no Early morning awakenings: no Daytime hypersomnolence: no Wakes feeling refreshed: no Good sleep hygiene: no Apnea: no Snoring: no Depressed/anxious mood: yes Recent stress: yes Restless legs/nocturnal leg cramps: no Chronic pain/arthritis: no History of sleep study: no Treatments attempted: lorazepam, melatonin, uinsom, benadryl and ambien   Coumadin Management.  The expected duration of coumadin treatment is lifelong The reason for anticoagulation is  A. Fib.  Present Coumadin dose: 7.5mg  daily Goal: 2.0-3.0  Excessive bruising: no Nose bleeding: no Rectal bleeding: no Prolonged menstrual cycles: N/A Eating diet with consistent amounts of foods containing Vitamin K:yes Any recent antibiotic use? no  Relevant past medical, surgical, family and social history reviewed and updated as indicated. Interim medical history since our last visit reviewed. Allergies and medications reviewed and updated.  Review of Systems  Constitutional: Negative.   Respiratory: Negative.    Cardiovascular: Negative.   Musculoskeletal: Negative.   Neurological: Negative.   Psychiatric/Behavioral: Negative.     Per HPI unless specifically indicated above     Objective:    BP (!) 177/84   Pulse 65   Temp 98 F (36.7 C) (Oral)   Wt 201 lb (91.2 kg)   LMP  (LMP Unknown)   SpO2 97%   BMI 36.76 kg/m   Wt Readings from Last 3 Encounters:  01/21/19 201 lb (91.2 kg)  01/03/19 204 lb (92.5 kg)  11/04/18 200 lb (90.7 kg)    Physical Exam Vitals signs and nursing note reviewed.  Constitutional:      General: She is not in acute distress.    Appearance: Normal appearance. She is not ill-appearing, toxic-appearing or diaphoretic.  HENT:     Head: Normocephalic and atraumatic.     Right Ear: External ear normal.     Left Ear: External ear normal.     Nose: Nose normal.     Mouth/Throat:     Mouth: Mucous membranes are moist.     Pharynx: Oropharynx is clear.  Eyes:     General: No scleral icterus.       Right eye: No discharge.        Left eye: No discharge.     Extraocular Movements: Extraocular movements intact.     Conjunctiva/sclera: Conjunctivae normal.     Pupils: Pupils are equal, round, and reactive to light.  Neck:     Musculoskeletal: Normal range of motion and neck supple.  Cardiovascular:     Rate and Rhythm: Normal rate and regular rhythm.     Pulses: Normal pulses.  Heart sounds: Normal heart sounds. No murmur. No friction rub. No gallop.   Pulmonary:     Effort: Pulmonary effort is normal. No respiratory distress.     Breath sounds: Normal breath sounds. No stridor. No wheezing, rhonchi or rales.  Chest:     Chest wall: No tenderness.  Musculoskeletal: Normal range of motion.  Skin:    General: Skin is warm and dry.     Capillary Refill: Capillary refill takes less than 2 seconds.     Coloration: Skin is not jaundiced or pale.     Findings: No bruising, erythema, lesion or rash.  Neurological:     General: No focal deficit present.      Mental Status: She is alert and oriented to person, place, and time. Mental status is at baseline.  Psychiatric:        Mood and Affect: Mood normal.        Behavior: Behavior normal.        Thought Content: Thought content normal.        Judgment: Judgment normal.     Results for orders placed or performed in visit on 01/03/19  WET PREP FOR [place], YEAST, CLUE   Specimen: Vaginal Fluid   VAGINAL FLUI  Result Value Ref Range   Trichomonas Exam Negative Negative   Yeast Exam Negative Negative   Clue Cell Exam Negative Negative  Microscopic Examination   URINE  Result Value Ref Range   WBC, UA 0-5 0 - 5 /hpf   RBC None seen 0 - 2 /hpf   Epithelial Cells (non renal) 0-10 0 - 10 /hpf   Bacteria, UA Moderate (A) None seen/Few  Urine Culture, Reflex   URINE  Result Value Ref Range   Urine Culture, Routine Final report    Organism ID, Bacteria Comment   CoaguChek XS/INR Waived  Result Value Ref Range   INR 2.6 (H) 0.9 - 1.1   Prothrombin Time 30.8 sec  UA/M w/rflx Culture, Routine   Specimen: Urine   URINE  Result Value Ref Range   Specific Gravity, UA 1.015 1.005 - 1.030   pH, UA 5.5 5.0 - 7.5   Color, UA Yellow Yellow   Appearance Ur Clear Clear   Leukocytes,UA Trace (A) Negative   Protein,UA Negative Negative/Trace   Glucose, UA Negative Negative   Ketones, UA Negative Negative   RBC, UA Negative Negative   Bilirubin, UA Negative Negative   Urobilinogen, Ur 1.0 0.2 - 1.0 mg/dL   Nitrite, UA Negative Negative   Microscopic Examination See below:    Urinalysis Reflex Comment       Assessment & Plan:   Problem List Items Addressed This Visit      Cardiovascular and Mediastinum   Benign essential hypertension    Still running high. Will start her back on her jardiance and recheck 1 month.       Relevant Medications   apixaban (ELIQUIS) 5 MG TABS tablet   ramipril (ALTACE) 10 MG capsule   ramipril (ALTACE) 5 MG capsule   amLODipine (NORVASC) 10 MG tablet    Paroxysmal A-fib (HCC)    Willing to start eliquis. Will continue coumadin until she gets her Rx and then change over not taking the 2 together. Recheck tolerance 1 month. Call with any concerns.       Relevant Medications   apixaban (ELIQUIS) 5 MG TABS tablet   ramipril (ALTACE) 10 MG capsule   ramipril (ALTACE) 5 MG capsule   amLODipine (NORVASC)  10 MG tablet   Other Relevant Orders   CoaguChek XS/INR Waived     Endocrine   Type 2 diabetes mellitus with hyperglycemia ([place]) - Primary    Stable with A1c of 8.1. Will restart her jardiance and change her NPH to tresiba (20units) Recheck 1 month. Call with any concerns. Continue to monitor closely.      Relevant Medications   empagliflozin (JARDIANCE) 25 MG TABS tablet   ramipril (ALTACE) 10 MG capsule   ramipril (ALTACE) 5 MG capsule   Insulin Degludec (TRESIBA) 100 UNIT/ML SOLN   Other Relevant Orders   Bayer DCA Hb A1c Waived     Other   Insomnia    Under good control on current regimen. Continue current regimen. Continue to monitor. Call with any concerns. Refills given for 3 months. Follow up 3 months.             Follow up plan: Return in about 4 weeks (around 02/18/2019).

## 2019-01-21 NOTE — Assessment & Plan Note (Signed)
Stable with A1c of 8.1. Will restart her jardiance and change her NPH to tresiba (20units) Recheck 1 month. Call with any concerns. Continue to monitor closely.

## 2019-01-21 NOTE — Patient Instructions (Signed)
Visit Information  Goals Addressed            This Visit's Progress     Patient Stated   . PharmD "My blood sugars are good" (pt-stated)       Current Barriers:  . Diabetes: uncontrolled; most recent A1c 8.1%; likely recommend goal <8% d/t age, comorbidities  o Reviewed chart; patient approved for Medicare Extra Help in 2018; all generic medications are $3.60/90 day supply, all brand $8.95/90 day supply . Current antihyperglycemic regimen: Insulin 70/30, 45 units QAM, 30 units QPM; notes she is not taking glipizide or Jardiance . Does report episodes of symptomatic hypoglycemia if she doesn't eat . Current blood glucose readings: [place] 120-130s; later in the day, generally in the 200s . Cardiovascular risk reduction: o Current hypertensive regimen: ramipril 15 mg daily, amlodipine 10 mg daily o Current hyperlipidemia regimen: none  Pharmacist Clinical Goal(s):  Marland Kitchen Over the next 90 days, patient will work with PharmD and primary care provider to address optimized medication management  Interventions: . Collaborated w/ Dr. Wynetta Emery. Recommend d/c glipizide, encourage patient to reinitiate Jardiance, and switch insulin 70/30 to true basal insulin to prevent hypoglycemia. Basal insulin and Jardiance should be $8.95/90 day supply, as patient as Medicare Extra Help . Counseled patient on goals to prevent hypoglycemia.  . Encouraged to fill 90 day supplies for better value   Patient Self Care Activities:  . Patient will check blood glucose BID, document, and provide at future appointments . Patient will contact provider with any episodes of hypoglycemia . Patient will report any questions or concerns to provider   Please see past updates related to this goal by clicking on the "Past Updates" button in the selected goal      . PharmD "My INR has been difficult to control" (pt-stated)       Current Barriers:  . Polypharmacy; complex patient with multiple comorbidities including atrial  fibrillation, T2DM, HTN . Recent confusion regarding warfarin dose noted. Therapeutic today at 2.3.  . Previously on DOAC therapy; d/c d/t cost concerns. Patient now has Medicare Extra Help, DOAC would be $8.95/90 day supply . CrCl ~ 44 mL/min using adjusted body weight  Pharmacist Clinical Goal(s):  Marland Kitchen Over the next 90 days, patient will work with PharmD and provider towards optimized medication management  Interventions: . Comprehensive medication review performed; medication list updated in electronic medical record . Collaborated w/ Dr. Wynetta Emery. Recommend d/c warfarin and switch to Eliquis.   Patient Self Care Activities:  . Patient will take medications as prescribed  Please see past updates related to this goal by clicking on the "Past Updates" button in the selected goal         The patient verbalized understanding of instructions provided today and declined a print copy of patient instruction materials.   Plan:  - Will outreach patient in 2-3 weeks to follow up on medication changes made by Dr. Wynetta Emery today  Courtney Heys, PharmD Clinical Pharmacist [place] [phone]

## 2019-01-21 NOTE — Assessment & Plan Note (Signed)
Still running high. Will start her back on her jardiance and recheck 1 month.

## 2019-01-27 DIAGNOSIS — Z7901 Long term (current) use of anticoagulants: Secondary | ICD-10-CM | POA: Diagnosis not present

## 2019-01-27 DIAGNOSIS — I480 Paroxysmal atrial fibrillation: Secondary | ICD-10-CM | POA: Diagnosis not present

## 2019-02-02 ENCOUNTER — Ambulatory Visit: Payer: Self-pay | Admitting: Pharmacist

## 2019-02-02 DIAGNOSIS — I480 Paroxysmal atrial fibrillation: Secondary | ICD-10-CM

## 2019-02-02 DIAGNOSIS — E1165 Type 2 diabetes mellitus with hyperglycemia: Secondary | ICD-10-CM

## 2019-02-02 NOTE — Patient Instructions (Addendum)
Ms. Minnis,   It was great to talk to you today!  For your atrial fibrillation, we are switching to Eliquis. Once you get the Eliquis, STOP warfarin and instead take Eliquis 5 mg twice daily.   For your diabetes, we switched to [place] as your insulin and switched to Jardiance from glipizide. STOP glipizide, and take the Jardiance every morning. Please check your blood sugars, and give me a call if you have low blood sugars less than 90.    Your other prescriptions are:  Blood pressure: amlodipine 10 mg, ramipril 15 mg (10 + 5 mg) Arthritis: meloxicam 15 mg  Acid Reflux: omeprazole 40 mg daily  Allergies: fluticasone nasal spray   Please call me with any questions!  Visit Information  Goals Addressed            This Visit's Progress     Patient Stated   . PharmD "My blood sugars are good" (pt-stated)       Current Barriers:  . Diabetes: uncontrolled; most recent A1c 8.1%; likely recommend goal <8% d/t age, comorbidities  . Current antihyperglycemic regimen: Tresiba 20 units QPM (took yesterday evening); Jardiance 25 mg daily- though has not picked up from pharmacy . Does report episodes of symptomatic hypoglycemia if she doesn't eat . Current blood glucose readings: Does not have strips right now . Cardiovascular risk reduction: o Current hypertensive regimen: ramipril 15 mg daily, amlodipine 10 mg daily o Current hyperlipidemia regimen: none  Pharmacist Clinical Goal(s):  Marland Kitchen Over the next 90 days, patient will work with PharmD and primary care provider to address optimized medication management  Interventions: . Collaborated w/ pharmacy; they will fill Jardiance today, they are canceling old scripts for glipizide and insulin NPH.  . Contacted patient; she expressed understanding and will start taking Jardiance tomorrow morning after picking it up from the pharmacy this evening.  . Encouraged to check blood sugars BID. Provided this information in writing to her via  mail  Patient Self Care Activities:  . Patient will check blood glucose BID, document, and provide at future appointments . Patient will contact provider with any episodes of hypoglycemia . Patient will report any questions or concerns to provider   Please see past updates related to this goal by clicking on the "Past Updates" button in the selected goal      . PharmD "My INR has been difficult to control" (pt-stated)       Current Barriers:  . Polypharmacy; complex patient with multiple comorbidities including atrial fibrillation, T2DM, HTN . Recent confusion regarding warfarin dose noted. Collaborated w/ Dr. Wynetta Emery, decided to switch to Eliquis.   Pharmacist Clinical Goal(s):  Marland Kitchen Over the next 90 days, patient will work with PharmD and provider towards optimized medication management  Interventions: . Contacted patient; reinforced that Eliquis replaces warfarin. Contacted [facility]; asked them to fill Eliquis and cancel warfarin refills in their system . Provided updated medication changes to patient in writing via mail.  Patient Self Care Activities:  . Patient will take medications as prescribed  Please see past updates related to this goal by clicking on the "Past Updates" button in the selected goal         The patient verbalized understanding of instructions provided today and declined a print copy of patient instruction materials.   Plan: - Will outreach patient in 2-5 business days to follow up on medication changes  Catie Darnelle Maffucci, PharmD Clinical Pharmacist [place] [phone]

## 2019-02-02 NOTE — Chronic Care Management (AMB) (Signed)
Chronic Care Management   Follow Up Note   02/02/2019 Name: Daisy Watkins MRN: [id number] DOB: Jun 08, 1932  Referred by: Valerie Roys, DO Reason for referral : Chronic Care Management (Medication Management)   Daisy Watkins is a 85 y.o. year old female who is a primary care patient of Valerie Roys, DO. The CCM team was consulted for assistance with chronic disease management and care coordination needs.    Received call from patient today with questions about updated medication regimen.   Review of patient status, including review of consultants reports, relevant laboratory and other test results, and collaboration with appropriate care team members and the patient's provider was performed as part of comprehensive patient evaluation and provision of chronic care management services.    SDOH (Social Determinants of Health) screening performed today: Financial Strain . See Care Plan for related entries.   Advanced Directives Status: N See Care Plan and Vyn[employer] application for related entries.  Outpatient Encounter Medications as of 02/02/2019  Medication Sig Note  . Insulin Degludec (TRESIBA) 100 UNIT/ML SOLN Inject 20 Units into the skin at bedtime.   Marland Kitchen acetaminophen (TYLENOL) 500 MG tablet Take 500 mg every 6 (six) hours as needed by mouth. As needed 02/13/2017: As needed.   Marland Kitchen amLODipine (NORVASC) 10 MG tablet Take 1 tablet (10 mg total) by mouth daily.   Marland Kitchen apixaban (ELIQUIS) 5 MG TABS tablet Take 1 tablet (5 mg total) by mouth 2 (two) times daily. (Patient not taking: Reported on 02/02/2019)   . azelastine (OPTIVAR) 0.05 % ophthalmic solution Place 1 drop into both eyes 2 (two) times daily.   . Cholecalciferol (VITAMIN D3) 1000 units CAPS Take by mouth. Daily 12/30/2016: Per pt takes one to twice a week.   . empagliflozin (JARDIANCE) 25 MG TABS tablet Take 25 mg by mouth daily. (Patient not taking: Reported on 02/02/2019)   . erythromycin ophthalmic ointment Place 1 application into  both eyes 2 (two) times daily as needed.   . fluticasone (FLONASE) 50 MCG/ACT nasal spray Place 2 sprays into both nostrils daily.   . Insulin Syringe-Needle U-100 (INSULIN SYRINGE .5CC/31GX5/16") 31G X 5/16" 0.5 ML MISC USE DAILY AS DIRECTED FOR DIABETES   . LORazepam (ATIVAN) 1 MG tablet Take 1 tablet (1 mg total) by mouth at bedtime as needed.   . magnesium oxide (MAG-OX) 400 MG tablet Take by mouth daily.    . meloxicam (MOBIC) 15 MG tablet Take 1 tablet (15 mg total) by mouth daily.   Marland Kitchen omeprazole (PRILOSEC) 40 MG capsule Take 1 capsule (40 mg total) by mouth daily.   . ramipril (ALTACE) 10 MG capsule TAKE ONE CAPSULE BY MOUTH DAILY. TO BE TAKEN WITH HER 5 MG CAPSULE FOR A TOTAL OF 15 MG DAILY   . ramipril (ALTACE) 5 MG capsule Take 1 capsule (5 mg total) by mouth daily. To be taken with her 10mg  cap for a total of 15mg  daily   . TRUETRACK TEST test strip    . vitamin C (ASCORBIC ACID) 500 MG tablet Take 500 mg by mouth daily. 02/13/2017: Twice a week   . warfarin (COUMADIN) 7.5 MG tablet Take 1 tablet (7.5 mg total) by mouth daily. 01/11/2019: Taking 6 mg daily; confirmed with pharmacy  . [DISCONTINUED] acetic acid-hydrocortisone (VOSOL-HC) OTIC solution Place 3 drops into both ears 2 (two) times daily.    No facility-administered encounter medications on file as of 02/02/2019.      Goals Addressed  This Visit's Progress     Patient Stated   . PharmD "My blood sugars are good" (pt-stated)       Current Barriers:  . Diabetes: uncontrolled; most recent A1c 8.1%; likely recommend goal <8% d/t age, comorbidities  . Current antihyperglycemic regimen: Tresiba 20 units QPM (took yesterday evening); Jardiance 25 mg daily- though has not picked up from pharmacy . Does report episodes of symptomatic hypoglycemia if she doesn't eat . Current blood glucose readings: Does not have strips right now . Cardiovascular risk reduction: o Current hypertensive regimen: ramipril 15 mg daily,  amlodipine 10 mg daily o Current hyperlipidemia regimen: none  Pharmacist Clinical Goal(s):  Marland Kitchen Over the next 90 days, patient will work with PharmD and primary care provider to address optimized medication management  Interventions: . Collaborated w/ pharmacy; they will fill Jardiance today, they are canceling old scripts for glipizide and insulin NPH.  . Contacted patient; she expressed understanding and will start taking Jardiance tomorrow morning after picking it up from the pharmacy this evening.  . Encouraged to check blood sugars BID. Provided this information in writing to her via mail  Patient Self Care Activities:  . Patient will check blood glucose BID, document, and provide at future appointments . Patient will contact provider with any episodes of hypoglycemia . Patient will report any questions or concerns to provider   Please see past updates related to this goal by clicking on the "Past Updates" button in the selected goal      . PharmD "My INR has been difficult to control" (pt-stated)       Current Barriers:  . Polypharmacy; complex patient with multiple comorbidities including atrial fibrillation, T2DM, HTN . Recent confusion regarding warfarin dose noted. Collaborated w/ Dr. Wynetta Emery, decided to switch to Eliquis.   Pharmacist Clinical Goal(s):  Marland Kitchen Over the next 90 days, patient will work with PharmD and provider towards optimized medication management  Interventions: . Contacted patient; reinforced that Eliquis replaces warfarin. Contacted [facility]; asked them to fill Eliquis and cancel warfarin refills in their system . Provided updated medication changes to patient in writing via mail.  Patient Self Care Activities:  . Patient will take medications as prescribed  Please see past updates related to this goal by clicking on the "Past Updates" button in the selected goal          Plan: - Will outreach patient in 2-5 business days to follow up on medication changes   Catie Darnelle Maffucci, PharmD Clinical Pharmacist [place] [phone]

## 2019-02-03 LAB — POCT INR: INR: 1.7 — AB (ref 2.0–3.0)

## 2019-02-04 ENCOUNTER — Ambulatory Visit: Payer: Self-pay | Admitting: Pharmacist

## 2019-02-04 DIAGNOSIS — I480 Paroxysmal atrial fibrillation: Secondary | ICD-10-CM

## 2019-02-04 DIAGNOSIS — E1165 Type 2 diabetes mellitus with hyperglycemia: Secondary | ICD-10-CM

## 2019-02-04 NOTE — Patient Instructions (Signed)
Visit Information  Goals Addressed            This Visit's Progress     Patient Stated   . PharmD "My blood sugars are good" (pt-stated)       Current Barriers:  . Diabetes: uncontrolled; most recent A1c 8.1%; likely recommend goal <8% d/t age, comorbidities  . Current antihyperglycemic regimen: Tresiba 20 units QPM (took yesterday evening); Jardiance 25 mg daily- patient picked up from pharmacy and has started . Does report episodes of symptomatic hypoglycemia if she doesn't eat . Current blood glucose readings: Does not have strips right now . Cardiovascular risk reduction: o Current hypertensive regimen: ramipril 15 mg daily, amlodipine 10 mg daily o Current hyperlipidemia regimen: none  Pharmacist Clinical Goal(s):  Marland Kitchen Over the next 90 days, patient will work with PharmD and primary care provider to address optimized medication management  Interventions: . Patient notes she took Jardiance this morning and will take Tresiba 20 units this evening. Encouraged BID blood sugar checks for medication adjustment. She verbalized understanding.  Patient Self Care Activities:  . Patient will check blood glucose BID, document, and provide at future appointments . Patient will contact provider with any episodes of hypoglycemia . Patient will report any questions or concerns to provider   Please see past updates related to this goal by clicking on the "Past Updates" button in the selected goal      . PharmD "My INR has been difficult to control" (pt-stated)       Current Barriers:  . Polypharmacy; complex patient with multiple comorbidities including atrial fibrillation, T2DM, HTN . Recent confusion regarding warfarin dose noted. Collaborated w/ Dr. Wynetta Emery, decided to switch to Eliquis.   Pharmacist Clinical Goal(s):  Marland Kitchen Over the next 90 days, patient will work with PharmD and provider towards optimized medication management  Interventions: . Contacted patient; she notes she picked up  Eliquis from the pharmacy and is taking twice daily. Eliquis has been discontinued. She verbalized understanding and denied questions at this time.  Patient Self Care Activities:  . Patient will take medications as prescribed  Please see past updates related to this goal by clicking on the "Past Updates" button in the selected goal         The patient verbalized understanding of instructions provided today and declined a print copy of patient instruction materials.   Plan:  - Will outreach patient in 2-3 weeks for blood sugar review  Catie Darnelle Maffucci, PharmD Clinical Pharmacist [place] [phone]

## 2019-02-04 NOTE — Chronic Care Management (AMB) (Signed)
Chronic Care Management   Follow Up Note   02/04/2019 Name: Daisy Watkins MRN: [id number] DOB: January 14, 1933  Referred by: Valerie Roys, DO Reason for referral : Chronic Care Management (Medication Management)   Daisy Watkins is a 85 y.o. year old female who is a primary care patient of Valerie Roys, DO. The CCM team was consulted for assistance with chronic disease management and care coordination needs.    Contacted patient to follow up on medication management.   Review of patient status, including review of consultants reports, relevant laboratory and other test results, and collaboration with appropriate care team members and the patient's provider was performed as part of comprehensive patient evaluation and provision of chronic care management services.    SDOH (Social Determinants of Health) screening performed today: None. See Care Plan for related entries.   Outpatient Encounter Medications as of 02/04/2019  Medication Sig Note  . acetaminophen (TYLENOL) 500 MG tablet Take 500 mg every 6 (six) hours as needed by mouth. As needed 02/13/2017: As needed.   Marland Kitchen amLODipine (NORVASC) 10 MG tablet Take 1 tablet (10 mg total) by mouth daily.   Marland Kitchen apixaban (ELIQUIS) 5 MG TABS tablet Take 1 tablet (5 mg total) by mouth 2 (two) times daily. (Patient not taking: Reported on 02/02/2019)   . azelastine (OPTIVAR) 0.05 % ophthalmic solution Place 1 drop into both eyes 2 (two) times daily.   . Cholecalciferol (VITAMIN D3) 1000 units CAPS Take by mouth. Daily 12/30/2016: Per pt takes one to twice a week.   . empagliflozin (JARDIANCE) 25 MG TABS tablet Take 25 mg by mouth daily. (Patient not taking: Reported on 02/02/2019)   . erythromycin ophthalmic ointment Place 1 application into both eyes 2 (two) times daily as needed.   . fluticasone (FLONASE) 50 MCG/ACT nasal spray Place 2 sprays into both nostrils daily.   . Insulin Degludec (TRESIBA) 100 UNIT/ML SOLN Inject 20 Units into the skin at bedtime.    . Insulin Syringe-Needle U-100 (INSULIN SYRINGE .5CC/31GX5/16") 31G X 5/16" 0.5 ML MISC USE DAILY AS DIRECTED FOR DIABETES   . LORazepam (ATIVAN) 1 MG tablet Take 1 tablet (1 mg total) by mouth at bedtime as needed.   . magnesium oxide (MAG-OX) 400 MG tablet Take by mouth daily.    . meloxicam (MOBIC) 15 MG tablet Take 1 tablet (15 mg total) by mouth daily.   Marland Kitchen omeprazole (PRILOSEC) 40 MG capsule Take 1 capsule (40 mg total) by mouth daily.   . ramipril (ALTACE) 10 MG capsule TAKE ONE CAPSULE BY MOUTH DAILY. TO BE TAKEN WITH HER 5 MG CAPSULE FOR A TOTAL OF 15 MG DAILY   . ramipril (ALTACE) 5 MG capsule Take 1 capsule (5 mg total) by mouth daily. To be taken with her 10mg  cap for a total of 15mg  daily   . TRUETRACK TEST test strip    . vitamin C (ASCORBIC ACID) 500 MG tablet Take 500 mg by mouth daily. 02/13/2017: Twice a week   . [DISCONTINUED] warfarin (COUMADIN) 7.5 MG tablet Take 1 tablet (7.5 mg total) by mouth daily. 01/11/2019: Taking 6 mg daily; confirmed with pharmacy   No facility-administered encounter medications on file as of 02/04/2019.      Goals Addressed            This Visit's Progress     Patient Stated   . PharmD "My blood sugars are good" (pt-stated)       Current Barriers:  . Diabetes: uncontrolled;  most recent A1c 8.1%; likely recommend goal <8% d/t age, comorbidities  . Current antihyperglycemic regimen: Tresiba 20 units QPM (took yesterday evening); Jardiance 25 mg daily- patient picked up from pharmacy and has started . Does report episodes of symptomatic hypoglycemia if she doesn't eat . Current blood glucose readings: Does not have strips right now . Cardiovascular risk reduction: o Current hypertensive regimen: ramipril 15 mg daily, amlodipine 10 mg daily o Current hyperlipidemia regimen: none  Pharmacist Clinical Goal(s):  Marland Kitchen Over the next 90 days, patient will work with PharmD and primary care provider to address optimized medication management   Interventions: . Patient notes she took Jardiance this morning and will take Tresiba 20 units this evening. Encouraged BID blood sugar checks for medication adjustment. She verbalized understanding.  Patient Self Care Activities:  . Patient will check blood glucose BID, document, and provide at future appointments . Patient will contact provider with any episodes of hypoglycemia . Patient will report any questions or concerns to provider   Please see past updates related to this goal by clicking on the "Past Updates" button in the selected goal      . PharmD "My INR has been difficult to control" (pt-stated)       Current Barriers:  . Polypharmacy; complex patient with multiple comorbidities including atrial fibrillation, T2DM, HTN . Recent confusion regarding warfarin dose noted. Collaborated w/ Dr. Wynetta Emery, decided to switch to Eliquis.   Pharmacist Clinical Goal(s):  Marland Kitchen Over the next 90 days, patient will work with PharmD and provider towards optimized medication management  Interventions: . Contacted patient; she notes she picked up Eliquis from the pharmacy and is taking twice daily. Eliquis has been discontinued. She verbalized understanding and denied questions at this time.  Patient Self Care Activities:  . Patient will take medications as prescribed  Please see past updates related to this goal by clicking on the "Past Updates" button in the selected goal         Plan:  - Will outreach patient in 2-3 weeks for blood sugar review  Catie Darnelle Maffucci, PharmD Clinical Pharmacist [place] [phone]

## 2019-02-11 ENCOUNTER — Ambulatory Visit: Payer: Self-pay | Admitting: Pharmacist

## 2019-02-11 ENCOUNTER — Telehealth: Payer: Self-pay | Admitting: Family Medicine

## 2019-02-11 DIAGNOSIS — I480 Paroxysmal atrial fibrillation: Secondary | ICD-10-CM

## 2019-02-11 DIAGNOSIS — Z794 Long term (current) use of insulin: Secondary | ICD-10-CM

## 2019-02-11 DIAGNOSIS — E1165 Type 2 diabetes mellitus with hyperglycemia: Secondary | ICD-10-CM

## 2019-02-11 LAB — POCT INR

## 2019-02-11 NOTE — Patient Instructions (Signed)
Visit Information  Goals Addressed            This Visit's Progress     Patient Stated   . PharmD "My blood sugars are good" (pt-stated)       Current Barriers:  . Diabetes: uncontrolled; most recent A1c 8.1%; likely recommend goal <8% d/t age, comorbidities  o Today, patient reports "feeling confused" and having "no energy" since starting the [place]. Is only able to give the 2 BG readings below. . Current antihyperglycemic regimen: Tresiba 20 units QPM; Jardiance 25 mg daily- requests that she stop taking this until f/u with Dr. Wynetta Emery . Current blood glucose readings:  o Fasting: 143, 139,  . Cardiovascular risk reduction: o Current hypertensive regimen: ramipril 15 mg daily, amlodipine 10 mg daily o Current hyperlipidemia regimen: none  Pharmacist Clinical Goal(s):  Marland Kitchen Over the next 90 days, patient will work with PharmD and primary care provider to address optimized medication management  Interventions: . Encouraged more frequent (BID) BG checks, fasting and post prandial, between now and follow up with Dr. Wynetta Emery. Denies any symptoms of hypoglycemia.  Marland Kitchen Re-educated on differences in PK of Tresiba vs NPH insulin she was previously on.  . Patient notes she is not taking Jardiance. Will inform Dr. Wynetta Emery of this. Encouraged her to plan to follow up w/ Dr. Wynetta Emery on 02/21/2019 as scheduled  Patient Self Care Activities:  . Patient will check blood glucose BID, document, and provide at future appointments . Patient will contact provider with any episodes of hypoglycemia . Patient will report any questions or concerns to provider   Please see past updates related to this goal by clicking on the "Past Updates" button in the selected goal      . PharmD "My INR has been difficult to control" (pt-stated)       Current Barriers:  . Polypharmacy; complex patient with multiple comorbidities including atrial fibrillation, T2DM, HTN . Recent confusion regarding warfarin dose  noted. Collaborated w/ Dr. Wynetta Emery, decided to switch to Eliquis.  . Patient checked INR today and was 1.0; patient extremely concerned about this  Pharmacist Clinical Goal(s):  Marland Kitchen Over the next 90 days, patient will work with PharmD and provider towards optimized medication management  Interventions: . Contacted patient; counseled that Eliquis does not affect the INR and we do not use INR to measure efficacy. Reassured that this medication is effective, even without routine monitoring. Reiterated lack of interactions. Encouraged patient to contact the INR machine company and/or her insurance to return INR meter  Patient Self Care Activities:  . Patient will take medications as prescribed  Please see past updates related to this goal by clicking on the "Past Updates" button in the selected goal         The patient verbalized understanding of instructions provided today and declined a print copy of patient instruction materials.   Plan:  - Will follow up with patient in the next 2-4 weeks for medication management review  Catie Darnelle Maffucci, PharmD Clinical Pharmacist [place] [phone]

## 2019-02-11 NOTE — Telephone Encounter (Signed)
Patient is no longer on warfarin therapy.   Received call from patient; counseled that INR does not need to be checked as it does not measure the efficacy of Eliquis.   Discussed that she needs to contact the company/her insurance to figure out how to return INR meter.   Patient verbalized understanding

## 2019-02-11 NOTE — Chronic Care Management (AMB) (Signed)
Chronic Care Management   Follow Up Note   02/11/2019 Name: Daisy Watkins MRN: [id number] DOB: 1933-03-13  Referred by: Valerie Roys, DO Reason for referral : Chronic Care Management (Medication Mangaement)   Daisy Watkins is a 85 y.o. year old female who is a primary care patient of Valerie Roys, DO. The CCM team was consulted for assistance with chronic disease management and care coordination needs.    Received call from patient with medication concerns.   Review of patient status, including review of consultants reports, relevant laboratory and other test results, and collaboration with appropriate care team members and the patient's provider was performed as part of comprehensive patient evaluation and provision of chronic care management services.    SDOH (Social Determinants of Health) screening performed today: Stress. See Care Plan for related entries.   Outpatient Encounter Medications as of 02/11/2019  Medication Sig Note   apixaban (ELIQUIS) 5 MG TABS tablet Take 1 tablet (5 mg total) by mouth 2 (two) times daily.    Insulin Degludec (TRESIBA) 100 UNIT/ML SOLN Inject 20 Units into the skin at bedtime.    acetaminophen (TYLENOL) 500 MG tablet Take 500 mg every 6 (six) hours as needed by mouth. As needed 02/13/2017: As needed.    amLODipine (NORVASC) 10 MG tablet Take 1 tablet (10 mg total) by mouth daily.    azelastine (OPTIVAR) 0.05 % ophthalmic solution Place 1 drop into both eyes 2 (two) times daily.    Cholecalciferol (VITAMIN D3) 1000 units CAPS Take by mouth. Daily 12/30/2016: Per pt takes one to twice a week.    empagliflozin (JARDIANCE) 25 MG TABS tablet Take 25 mg by mouth daily. (Patient not taking: Reported on 02/11/2019)    erythromycin ophthalmic ointment Place 1 application into both eyes 2 (two) times daily as needed.    fluticasone (FLONASE) 50 MCG/ACT nasal spray Place 2 sprays into both nostrils daily.    Insulin Syringe-Needle U-100 (INSULIN  SYRINGE .5CC/31GX5/16") 31G X 5/16" 0.5 ML MISC USE DAILY AS DIRECTED FOR DIABETES    LORazepam (ATIVAN) 1 MG tablet Take 1 tablet (1 mg total) by mouth at bedtime as needed.    magnesium oxide (MAG-OX) 400 MG tablet Take by mouth daily.     meloxicam (MOBIC) 15 MG tablet Take 1 tablet (15 mg total) by mouth daily.    omeprazole (PRILOSEC) 40 MG capsule Take 1 capsule (40 mg total) by mouth daily.    ramipril (ALTACE) 10 MG capsule TAKE ONE CAPSULE BY MOUTH DAILY. TO BE TAKEN WITH HER 5 MG CAPSULE FOR A TOTAL OF 15 MG DAILY    ramipril (ALTACE) 5 MG capsule Take 1 capsule (5 mg total) by mouth daily. To be taken with her 10mg  cap for a total of 15mg  daily    TRUETRACK TEST test strip     vitamin C (ASCORBIC ACID) 500 MG tablet Take 500 mg by mouth daily. 02/13/2017: Twice a week    No facility-administered encounter medications on file as of 02/11/2019.      Goals Addressed            This Visit's Progress     Patient Stated    PharmD "My blood sugars are good" (pt-stated)       Current Barriers:   Diabetes: uncontrolled; most recent A1c 8.1%; likely recommend goal <8% d/t age, comorbidities  o Today, patient reports "feeling confused" and having "no energy" since starting the [place]. Is only able to give the  2 BG readings below.  Current antihyperglycemic regimen: Tresiba 20 units QPM; Jardiance 25 mg daily- requests that she stop taking this until f/u with Dr. Wynetta Emery  Current blood glucose readings:  o Fasting: 143, 139,   Cardiovascular risk reduction: o Current hypertensive regimen: ramipril 15 mg daily, amlodipine 10 mg daily o Current hyperlipidemia regimen: none  Pharmacist Clinical Goal(s):   Over the next 90 days, patient will work with PharmD and primary care provider to address optimized medication management  Interventions:  Encouraged more frequent (BID) BG checks, fasting and post prandial, between now and follow up with Dr. Wynetta Emery. Denies any  symptoms of hypoglycemia.   Re-educated on differences in PK of Tresiba vs NPH insulin she was previously on.   Patient notes she is not taking Jardiance. Will inform Dr. Wynetta Emery of this. Encouraged her to plan to follow up w/ Dr. Wynetta Emery on 02/21/2019 as scheduled  Patient Self Care Activities:   Patient will check blood glucose BID, document, and provide at future appointments  Patient will contact provider with any episodes of hypoglycemia  Patient will report any questions or concerns to provider   Please see past updates related to this goal by clicking on the "Past Updates" button in the selected goal       PharmD "My INR has been difficult to control" (pt-stated)       Current Barriers:   Polypharmacy; complex patient with multiple comorbidities including atrial fibrillation, T2DM, HTN  Recent confusion regarding warfarin dose noted. Collaborated w/ Dr. Wynetta Emery, decided to switch to Eliquis.   Patient checked INR today and was 1.0; patient extremely concerned about this  Pharmacist Clinical Goal(s):   Over the next 90 days, patient will work with PharmD and provider towards optimized medication management  Interventions:  Contacted patient; counseled that Eliquis does not affect the INR and we do not use INR to measure efficacy. Reassured that this medication is effective, even without routine monitoring. Reiterated lack of interactions. Encouraged patient to contact the INR machine company and/or her insurance to return INR meter  Patient Self Care Activities:   Patient will take medications as prescribed  Please see past updates related to this goal by clicking on the "Past Updates" button in the selected goal          Plan:  - Will follow up with patient in the next 2-4 weeks for medication management review  Catie Darnelle Maffucci, PharmD Clinical Pharmacist [place] [phone]

## 2019-02-11 NOTE — Telephone Encounter (Signed)
Rod with [facility] is calling to report the patient protime is 1.0

## 2019-02-21 ENCOUNTER — Encounter: Payer: Self-pay | Admitting: Family Medicine

## 2019-02-21 ENCOUNTER — Ambulatory Visit (INDEPENDENT_AMBULATORY_CARE_PROVIDER_SITE_OTHER): Payer: Medicare HMO | Admitting: Family Medicine

## 2019-02-21 ENCOUNTER — Other Ambulatory Visit: Payer: Self-pay

## 2019-02-21 VITALS — BP 133/62 | HR 71 | Temp 97.8°F

## 2019-02-21 DIAGNOSIS — Z794 Long term (current) use of insulin: Secondary | ICD-10-CM

## 2019-02-21 DIAGNOSIS — I480 Paroxysmal atrial fibrillation: Secondary | ICD-10-CM | POA: Diagnosis not present

## 2019-02-21 DIAGNOSIS — I10 Essential (primary) hypertension: Secondary | ICD-10-CM | POA: Diagnosis not present

## 2019-02-21 DIAGNOSIS — E1165 Type 2 diabetes mellitus with hyperglycemia: Secondary | ICD-10-CM | POA: Diagnosis not present

## 2019-02-21 DIAGNOSIS — R300 Dysuria: Secondary | ICD-10-CM

## 2019-02-21 LAB — COAGUCHEK XS/INR WAIVED
INR: 1 (ref 0.9–1.1)
Prothrombin Time: 12.3 s

## 2019-02-21 MED ORDER — APIXABAN 5 MG PO TABS: 5.0000 mg | ORAL_TABLET | Freq: Two times a day (BID) | ORAL | 1 refills | Status: DC

## 2019-02-21 MED ORDER — FLUCONAZOLE 150 MG PO TABS: 150.0000 mg | ORAL_TABLET | Freq: Once | ORAL | 0 refills | Status: AC

## 2019-02-21 NOTE — Assessment & Plan Note (Signed)
Tolerating her eliquis well. No concerns. Continue current regimen. Refills given. CBC checked today. Call with any concerns.

## 2019-02-21 NOTE — Assessment & Plan Note (Signed)
Under good control on current regimen. Continue current regimen. Continue to monitor. Call with any concerns. Refills given.   

## 2019-02-21 NOTE — Progress Notes (Signed)
BP 133/62   Pulse 71   Temp 97.8 F (36.6 C)   LMP  (LMP Unknown)   SpO2 100%    Subjective:    Patient ID: Daisy Watkins, female    DOB: 27-Feb-1933, 85 y.o.   MRN: [id number]  HPI: Daisy Watkins is a 85 y.o. female  No chief complaint on file.  HYPERTENSION Hypertension status: better  Satisfied with current treatment? yes Duration of hypertension: chronic BP monitoring frequency:  not checking BP medication side effects:  no Medication compliance: excellent compliance Previous BP meds: ramipril, amlodipine Aspirin: no Recurrent headaches: no Visual changes: no Palpitations: no Dyspnea: no Chest pain: no Lower extremity edema: no Dizzy/lightheaded: no  ATRIAL FIBRILLATION- started on eliquis last visit. Tolerating it weill with no concerns.  Atrial fibrillation status: controlled Satisfied with current treatment: yes  Medication side effects:  no Medication compliance: excellent compliance Palpitations:  no Chest pain:  no Dyspnea on exertion:  no Orthopnea:  no Syncope:  no Edema:  no Ventricular rate control: Not indicated Anti-coagulation: long acting  DIABETES- tresiba made her feel confused and tired. Sugars have been running about 114 Hypoglycemic episodes:no Polydipsia/polyuria: no Visual disturbance: no Chest pain: no Paresthesias: no Glucose Monitoring: yes  Accucheck frequency: TID Taking Insulin?: yes  Long acting insulin: 20 units tresiba Blood Pressure Monitoring: not checking Retinal Examination: Not up to Date Foot Exam: Not up to Date Diabetic Education: Completed Pneumovax: Up to Date Influenza: Up to Date Aspirin: no  URINARY SYMPTOMS Duration: 1 week Dysuria: burning Urinary frequency: yes Urgency: yes Small volume voids: yes Symptom severity: severe Urinary incontinence: no Foul odor: yes Hematuria: no Abdominal pain: yes Back pain: yes Suprapubic pain/pressure: yes Flank pain: no Fever:  no Vomiting: no Relief with  cranberry juice: no Relief with pyridium: no Status: worse Previous urinary tract infection: no Recurrent urinary tract infection: no Vaginal discharge: no Treatments attempted: increasing fluids    Relevant past medical, surgical, family and social history reviewed and updated as indicated. Interim medical history since our last visit reviewed. Allergies and medications reviewed and updated.  Review of Systems  Constitutional: Negative.   Respiratory: Negative.   Cardiovascular: Negative.   Gastrointestinal: Negative.   Musculoskeletal: Negative.   Neurological: Negative.   Psychiatric/Behavioral: Negative.     Per HPI unless specifically indicated above     Objective:    BP 133/62   Pulse 71   Temp 97.8 F (36.6 C)   LMP  (LMP Unknown)   SpO2 100%   Wt Readings from Last 3 Encounters:  01/21/19 201 lb (91.2 kg)  01/03/19 204 lb (92.5 kg)  11/04/18 200 lb (90.7 kg)    Physical Exam Vitals signs and nursing note reviewed.  Constitutional:      General: She is not in acute distress.    Appearance: Normal appearance. She is not ill-appearing, toxic-appearing or diaphoretic.  HENT:     Head: Normocephalic and atraumatic.     Right Ear: External ear normal.     Left Ear: External ear normal.     Nose: Nose normal.     Mouth/Throat:     Mouth: Mucous membranes are moist.     Pharynx: Oropharynx is clear.  Eyes:     General: No scleral icterus.       Right eye: No discharge.        Left eye: No discharge.     Extraocular Movements: Extraocular movements intact.     Conjunctiva/sclera:  Conjunctivae normal.     Pupils: Pupils are equal, round, and reactive to light.  Neck:     Musculoskeletal: Normal range of motion and neck supple.  Cardiovascular:     Rate and Rhythm: Normal rate and regular rhythm.     Pulses: Normal pulses.     Heart sounds: Normal heart sounds. No murmur. No friction rub. No gallop.   Pulmonary:     Effort: Pulmonary effort is normal. No  respiratory distress.     Breath sounds: Normal breath sounds. No stridor. No wheezing, rhonchi or rales.  Chest:     Chest wall: No tenderness.  Musculoskeletal: Normal range of motion.  Skin:    General: Skin is warm and dry.     Capillary Refill: Capillary refill takes less than 2 seconds.     Coloration: Skin is not jaundiced or pale.     Findings: No bruising, erythema, lesion or rash.  Neurological:     General: No focal deficit present.     Mental Status: She is alert and oriented to person, place, and time. Mental status is at baseline.  Psychiatric:        Mood and Affect: Mood normal.        Behavior: Behavior normal.        Thought Content: Thought content normal.        Judgment: Judgment normal.     Results for orders placed or performed in visit on 01/21/19  Bayer DCA Hb A1c Waived  Result Value Ref Range   HB A1C (BAYER DCA - WAIVED) 8.1 (H) <7.0 %  CoaguChek XS/INR Waived  Result Value Ref Range   INR 2.3 (H) 0.9 - 1.1   Prothrombin Time 27.1 sec      Assessment & Plan:   Problem List Items Addressed This Visit      Cardiovascular and Mediastinum   Benign essential hypertension    Under good control on current regimen. Continue current regimen. Continue to monitor. Call with any concerns. Refills given.        Relevant Medications   apixaban (ELIQUIS) 5 MG TABS tablet   Paroxysmal A-fib ([place]) - Primary    Tolerating her eliquis well. No concerns. Continue current regimen. Refills given. CBC checked today. Call with any concerns.       Relevant Medications   apixaban (ELIQUIS) 5 MG TABS tablet   Other Relevant Orders   CoaguChek XS/INR Waived   CBC with Differential OUT   CCM Social work     Endocrine   Type 2 diabetes mellitus with hyperglycemia ([name])    Not tolerating the jardiance. Will continue her tresiba. Continue to monitor. Recheck 2 months. Call with any concerns.        Other Visit Diagnoses    Dysuria       Will await urine.  Concern for yeast- will treat with diflucan and stop jardiance. Call with any concerns.    Relevant Orders   UA (DOT)       Follow up plan: Return in about 2 months (around 04/23/2019).

## 2019-02-21 NOTE — Assessment & Plan Note (Signed)
Not tolerating the jardiance. Will continue her tresiba. Continue to monitor. Recheck 2 months. Call with any concerns.

## 2019-02-22 ENCOUNTER — Other Ambulatory Visit: Payer: Self-pay | Admitting: Family Medicine

## 2019-02-22 ENCOUNTER — Other Ambulatory Visit: Payer: Medicare HMO

## 2019-02-22 ENCOUNTER — Encounter: Payer: Self-pay | Admitting: Family Medicine

## 2019-02-22 ENCOUNTER — Other Ambulatory Visit: Payer: Self-pay

## 2019-02-22 DIAGNOSIS — R300 Dysuria: Secondary | ICD-10-CM | POA: Diagnosis not present

## 2019-02-22 LAB — CBC WITH DIFFERENTIAL/PLATELET
Basophils Absolute: 0 10*3/uL (ref 0.0–0.2)
Basos: 1 %
EOS (ABSOLUTE): 0.1 10*3/uL (ref 0.0–0.4)
Eos: 2 %
Hematocrit: 40.4 % (ref 34.0–46.6)
Hemoglobin: 13.2 g/dL (ref 11.1–15.9)
Immature Grans (Abs): 0 10*3/uL (ref 0.0–0.1)
Immature Granulocytes: 0 %
Lymphocytes Absolute: 1.8 10*3/uL (ref 0.7–3.1)
Lymphs: 38 %
MCH: 28.4 pg (ref 26.6–33.0)
MCHC: 32.7 g/dL (ref 31.5–35.7)
MCV: 87 fL (ref 79–97)
Monocytes Absolute: 0.5 10*3/uL (ref 0.1–0.9)
Monocytes: 10 %
Neutrophils Absolute: 2.3 10*3/uL (ref 1.4–7.0)
Neutrophils: 49 %
Platelets: 224 10*3/uL (ref 150–450)
RBC: 4.64 x10E6/uL (ref 3.77–5.28)
RDW: 13.4 % (ref 11.7–15.4)
WBC: 4.8 10*3/uL (ref 3.4–10.8)

## 2019-02-23 ENCOUNTER — Telehealth: Payer: Self-pay

## 2019-02-23 NOTE — Telephone Encounter (Signed)
Her urine did not show a clear UTI- it showed sugar, which can irritate her bladder. I will let her know when I get the culture back- if she starts feeling worse, let me know

## 2019-02-23 NOTE — Telephone Encounter (Signed)
Copied from [place] [id number]. Topic: General - Other >> Feb 23, 2019 11:21 AM Rayann Heman wrote: Reason for CRM: pt called and stated that she would like a call back regarding recent labs. Pt states that she thinks she may have a UTI. Please advise   Results not crossed over into Epic yet but had [facility] pull results. UA results are 2+ glucose, 6 to 10 WBC seen, and moderate bacteria seen, everything normal on UA. Culture result still pending.

## 2019-02-23 NOTE — Telephone Encounter (Signed)
Patient notified and verbalized understanding. 

## 2019-02-24 LAB — UA/M W/RFLX CULTURE, ROUTINE
Bilirubin, UA: NEGATIVE
Ketones, UA: NEGATIVE
Leukocytes,UA: NEGATIVE
Nitrite, UA: NEGATIVE
Protein,UA: NEGATIVE
RBC, UA: NEGATIVE
Specific Gravity, UA: 1.025 (ref 1.005–1.030)
Urobilinogen, Ur: 0.2 mg/dL (ref 0.2–1.0)
pH, UA: 6 (ref 5.0–7.5)

## 2019-02-24 LAB — MICROSCOPIC EXAMINATION: RBC, Urine: NONE SEEN /hpf (ref 0–2)

## 2019-02-24 LAB — URINE CULTURE, REFLEX

## 2019-02-28 ENCOUNTER — Telehealth: Payer: Self-pay | Admitting: Family Medicine

## 2019-02-28 DIAGNOSIS — R300 Dysuria: Secondary | ICD-10-CM

## 2019-02-28 NOTE — Telephone Encounter (Signed)
Please let her know that her urine did not grow out any bacteria. If she is not feeling better, we can check again- but I think it's from her sugar. Thanks!

## 2019-02-28 NOTE — Telephone Encounter (Signed)
Tried calling patient again, still unavailable. Will try again in the morning.

## 2019-02-28 NOTE — Telephone Encounter (Signed)
Attempted to reach patient twice. Call stated that the customer was not available at this time. Will try to call again later.

## 2019-03-01 NOTE — Telephone Encounter (Signed)
Tried calling patient, no answer and no VM set up. Will try to call again later.  

## 2019-03-01 NOTE — Telephone Encounter (Signed)
She can drop off a sample.

## 2019-03-01 NOTE — Telephone Encounter (Signed)
Called and notified patient of result. She states that she is not feeling better. Dr. Wynetta Emery, does patient need an actual visit or just a urine sample dropped off?

## 2019-03-01 NOTE — Telephone Encounter (Signed)
Patient notified. Coming in tomorrow to leave sample.

## 2019-03-02 ENCOUNTER — Other Ambulatory Visit: Payer: Medicare HMO

## 2019-03-02 ENCOUNTER — Other Ambulatory Visit: Payer: Self-pay

## 2019-03-02 DIAGNOSIS — R300 Dysuria: Secondary | ICD-10-CM | POA: Diagnosis not present

## 2019-03-04 LAB — UA/M W/RFLX CULTURE, ROUTINE
Bilirubin, UA: NEGATIVE
Ketones, UA: NEGATIVE
Leukocytes,UA: NEGATIVE
Nitrite, UA: NEGATIVE
Protein,UA: NEGATIVE
RBC, UA: NEGATIVE
Specific Gravity, UA: 1.025 (ref 1.005–1.030)
Urobilinogen, Ur: 0.2 mg/dL (ref 0.2–1.0)
pH, UA: 5.5 (ref 5.0–7.5)

## 2019-03-04 LAB — MICROSCOPIC EXAMINATION: RBC, Urine: NONE SEEN /hpf (ref 0–2)

## 2019-03-04 LAB — URINE CULTURE, REFLEX

## 2019-03-07 ENCOUNTER — Other Ambulatory Visit: Payer: Self-pay | Admitting: Family Medicine

## 2019-03-11 ENCOUNTER — Ambulatory Visit: Payer: Self-pay | Admitting: Pharmacist

## 2019-03-11 NOTE — Chronic Care Management (AMB) (Signed)
  Chronic Care Management   Note  03/11/2019 Name: Daisy Watkins MRN: [id number] DOB: 21-Jun-1932  [name] is a 85 y.o. year old female who is a primary care patient of Valerie Roys, DO. The CCM team was consulted for assistance with chronic disease management and care coordination needs.    Contacted patient for medication management review. She noted that she had a headache and didn't sleep well last night, and was not interested in talking today. Requested I call her back next week.   Follow up plan: - Scheduled outreach next week as requested by patient  Catie Darnelle Maffucci, PharmD, [place] [phone]

## 2019-03-18 ENCOUNTER — Ambulatory Visit (INDEPENDENT_AMBULATORY_CARE_PROVIDER_SITE_OTHER): Payer: Medicare HMO | Admitting: Pharmacist

## 2019-03-18 DIAGNOSIS — E1165 Type 2 diabetes mellitus with hyperglycemia: Secondary | ICD-10-CM | POA: Diagnosis not present

## 2019-03-18 DIAGNOSIS — I480 Paroxysmal atrial fibrillation: Secondary | ICD-10-CM | POA: Diagnosis not present

## 2019-03-18 DIAGNOSIS — Z794 Long term (current) use of insulin: Secondary | ICD-10-CM | POA: Diagnosis not present

## 2019-03-18 NOTE — Patient Instructions (Signed)
Visit Information  Goals Addressed            This Visit's Progress     Patient Stated   . PharmD "My blood sugars are good" (pt-stated)       Current Barriers:  . Diabetes: uncontrolled; most recent A1c 8.1%; likely recommend goal <8% d/t age, comorbidities  o Notes that she "feels terrible" since recent medication changes. At first cannot identify specific symptoms, but then after my prompting, does agree that she has HA, lightheadedness, and then that her "bones ache" o Eventually indicates that she suspects the new insulin regimen to be the culprit. Does not remember that we had switched from insulin 70/30 to prevent episodes of hypoglycemia o Also wonders why she switched off of Eliquis . Current antihyperglycemic regimen: Tresiba 20 units QPM;  . Current blood glucose readings:  o Fasting: 101-203, most readings >150 . Cardiovascular risk reduction: o Current hypertensive regimen: ramipril 15 mg daily, amlodipine 10 mg daily o Current hyperlipidemia regimen: none  Pharmacist Clinical Goal(s):  Marland Kitchen Over the next 90 days, patient will work with PharmD and primary care provider to address optimized medication management  Interventions: . Reviewed her reported hx of midday hypoglycemia, likely as a result of insulin 70/30 therapy. Reviewed that this is why we switched to [place] therapy. Denies hypoglycemic episodes since the switch.  . Reviewed that we were unable to maintain a stable therapeutic dose on warfarin, and that Eliquis has a lower risk of bleeding. Reviewed that INR testing is not required to evaluate efficacy of therapy.  . Will collaborate w/ Dr. Wynetta Emery to determine if she thinks patient needs sooner f/u than current scheduled appt on 04/22/2019. Encouraged patient to contact the office if she feels symptoms worsen and that she needs to be seen sooner  Patient Self Care Activities:  . Patient will check blood glucose BID, document, and provide at future  appointments . Patient will contact provider with any episodes of hypoglycemia . Patient will report any questions or concerns to provider   Please see past updates related to this goal by clicking on the "Past Updates" button in the selected goal         The patient verbalized understanding of instructions provided today and declined a print copy of patient instruction materials.   Plan: - Scheduled medication management follow up in ~6 weeks  Catie Darnelle Maffucci, PharmD, [place] [phone]

## 2019-03-18 NOTE — Chronic Care Management (AMB) (Signed)
Chronic Care Management   Follow Up Note   03/18/2019 Name: Daisy Watkins MRN: [id number] DOB: 1933/03/26  Referred by: Daisy Roys, DO Reason for referral : Chronic Care Management (Medication Management)   Daisy Watkins is a 85 y.o. year old female who is a primary care patient of Daisy Roys, DO. The CCM team was consulted for assistance with chronic disease management and care coordination needs.    Contacted patient for medication management review and support.   Review of patient status, including review of consultants reports, relevant laboratory and other test results, and collaboration with appropriate care team members and the patient's provider was performed as part of comprehensive patient evaluation and provision of chronic care management services.    SDOH (Social Determinants of Health) screening performed today: Stress. See Care Plan for related entries.   Outpatient Encounter Medications as of 03/18/2019  Medication Sig Note  . acetaminophen (TYLENOL) 500 MG tablet Take 500 mg every 6 (six) hours as needed by mouth. As needed 02/13/2017: As needed.   Marland Kitchen amLODipine (NORVASC) 10 MG tablet Take 1 tablet (10 mg total) by mouth daily.   Marland Kitchen apixaban (ELIQUIS) 5 MG TABS tablet Take 1 tablet (5 mg total) by mouth 2 (two) times daily.   Marland Kitchen azelastine (OPTIVAR) 0.05 % ophthalmic solution Place 1 drop into both eyes 2 (two) times daily.   . Cholecalciferol (VITAMIN D3) 1000 units CAPS Take by mouth. Daily 12/30/2016: Per pt takes one to twice a week.   . erythromycin ophthalmic ointment Place 1 application into both eyes 2 (two) times daily as needed.   . fluticasone (FLONASE) 50 MCG/ACT nasal spray Place 2 sprays into both nostrils daily.   . Insulin Degludec (TRESIBA) 100 UNIT/ML SOLN Inject 20 Units into the skin at bedtime.   . Insulin Syringe-Needle U-100 (INSULIN SYRINGE .5CC/31GX5/16") 31G X 5/16" 0.5 ML MISC USE DAILY AS DIRECTED FOR DIABETES   . LORazepam (ATIVAN) 1  MG tablet Take 1 tablet (1 mg total) by mouth at bedtime as needed.   . magnesium oxide (MAG-OX) 400 MG tablet Take by mouth daily.    . meloxicam (MOBIC) 15 MG tablet Take 1 tablet (15 mg total) by mouth daily.   Marland Kitchen omeprazole (PRILOSEC) 40 MG capsule Take 1 capsule (40 mg total) by mouth daily.   . ramipril (ALTACE) 10 MG capsule TAKE ONE CAPSULE BY MOUTH DAILY. TO BE TAKEN WITH HER 5 MG CAPSULE FOR A TOTAL OF 15 MG DAILY   . ramipril (ALTACE) 5 MG capsule TAKE ONE CAPSULE BY MOUTH DAILY WITH 10 MG CAPSULE FOR A TOTAL OF 15 MG DAILY   . TRUETRACK TEST test strip    . vitamin C (ASCORBIC ACID) 500 MG tablet Take 500 mg by mouth daily. 02/13/2017: Twice a week    No facility-administered encounter medications on file as of 03/18/2019.     Goals Addressed            This Visit's Progress     Patient Stated   . PharmD "My blood sugars are good" (pt-stated)       Current Barriers:  . Diabetes: uncontrolled; most recent A1c 8.1%; likely recommend goal <8% d/t age, comorbidities  o Notes that she "feels terrible" since recent medication changes. At first cannot identify specific symptoms, but then after my prompting, does agree that she has HA, lightheadedness, and then that her "bones ache" o Eventually indicates that she suspects the new insulin regimen to be  the culprit. Does not remember that we had switched from insulin 70/30 to prevent episodes of hypoglycemia o Also wonders why she switched off of Eliquis . Current antihyperglycemic regimen: Tresiba 20 units QPM;  . Current blood glucose readings:  o Fasting: 101-203, most readings >150 . Cardiovascular risk reduction: o Current hypertensive regimen: ramipril 15 mg daily, amlodipine 10 mg daily o Current hyperlipidemia regimen: none  Pharmacist Clinical Goal(s):  Marland Kitchen Over the next 90 days, patient will work with PharmD and primary care provider to address optimized medication management  Interventions: . Reviewed her reported hx  of midday hypoglycemia, likely as a result of insulin 70/30 therapy. Reviewed that this is why we switched to [place] therapy. Denies hypoglycemic episodes since the switch.  . Reviewed that we were unable to maintain a stable therapeutic dose on warfarin, and that Eliquis has a lower risk of bleeding. Reviewed that INR testing is not required to evaluate efficacy of therapy.  . Will collaborate w/ Dr. Wynetta Emery to determine if she thinks patient needs sooner f/u than current scheduled appt on 04/22/2019  Patient Self Care Activities:  . Patient will check blood glucose BID, document, and provide at future appointments . Patient will contact provider with any episodes of hypoglycemia . Patient will report any questions or concerns to provider   Please see past updates related to this goal by clicking on the "Past Updates" button in the selected goal          Plan: - Scheduled medication management follow up in ~6 weeks  Daisy Watkins, PharmD, [place] [phone]

## 2019-03-31 LAB — POCT INR: INR: 2.2 (ref 2.0–3.0)

## 2019-04-19 ENCOUNTER — Telehealth: Payer: Self-pay

## 2019-04-21 ENCOUNTER — Other Ambulatory Visit: Payer: Self-pay | Admitting: Family Medicine

## 2019-04-21 NOTE — Telephone Encounter (Signed)
Routing to provider  

## 2019-04-21 NOTE — Telephone Encounter (Signed)
Requested medication (s) are due for refill today: yes  Requested medication (s) are on the active medication list:yes  Last refill:  01/21/2019  Future visit scheduled: yes  Notes to clinic:  Medication not Delegated    Requested Prescriptions  Pending Prescriptions Disp Refills   LORazepam (ATIVAN) 1 MG tablet [Pharmacy Med Name: LORAZEPAM 1MG  TABLETS] 30 tablet     Sig: TAKE 1 TABLET(1 MG) BY MOUTH AT BEDTIME AS NEEDED      Not Delegated - Psychiatry:  Anxiolytics/Hypnotics Failed - 04/21/2019 12:15 PM      Failed - This refill cannot be delegated      Failed - Urine Drug Screen completed in last 360 days.      Passed - Valid encounter within last 6 months    Recent Outpatient Visits           1 month ago Paroxysmal A-fib [facility])   [place], Megan P, DO   3 months ago Type 2 diabetes mellitus with hyperglycemia, with long-term current use of insulin ([place])   [place], Megan P, DO   3 months ago Acute cystitis without hematuria   [place], DO   4 months ago Paroxysmal A-fib [facility])   [place], Megan P, DO   4 months ago Paroxysmal A-fib [facility])   [place], Barb Merino, DO       Future Appointments             Tomorrow Valerie Roys, DO [place], [place]

## 2019-04-22 ENCOUNTER — Telehealth: Payer: Self-pay | Admitting: Family Medicine

## 2019-04-22 ENCOUNTER — Other Ambulatory Visit: Payer: Self-pay | Admitting: Family Medicine

## 2019-04-22 ENCOUNTER — Encounter: Payer: Self-pay | Admitting: Family Medicine

## 2019-04-22 ENCOUNTER — Other Ambulatory Visit: Payer: Self-pay

## 2019-04-22 ENCOUNTER — Ambulatory Visit (INDEPENDENT_AMBULATORY_CARE_PROVIDER_SITE_OTHER): Payer: Medicare HMO | Admitting: Family Medicine

## 2019-04-22 VITALS — BP 150/64 | HR 64 | Temp 98.2°F

## 2019-04-22 DIAGNOSIS — I10 Essential (primary) hypertension: Secondary | ICD-10-CM | POA: Diagnosis not present

## 2019-04-22 DIAGNOSIS — E1165 Type 2 diabetes mellitus with hyperglycemia: Secondary | ICD-10-CM

## 2019-04-22 DIAGNOSIS — F322 Major depressive disorder, single episode, severe without psychotic features: Secondary | ICD-10-CM

## 2019-04-22 DIAGNOSIS — I480 Paroxysmal atrial fibrillation: Secondary | ICD-10-CM

## 2019-04-22 DIAGNOSIS — F5101 Primary insomnia: Secondary | ICD-10-CM | POA: Diagnosis not present

## 2019-04-22 DIAGNOSIS — F411 Generalized anxiety disorder: Secondary | ICD-10-CM

## 2019-04-22 DIAGNOSIS — E782 Mixed hyperlipidemia: Secondary | ICD-10-CM | POA: Diagnosis not present

## 2019-04-22 DIAGNOSIS — Z794 Long term (current) use of insulin: Secondary | ICD-10-CM

## 2019-04-22 LAB — BAYER DCA HB A1C WAIVED: HB A1C (BAYER DCA - WAIVED): 9.3 % — ABNORMAL HIGH (ref <7.0)

## 2019-04-22 MED ORDER — LORAZEPAM 1 MG PO TABS: 1.0000 mg | ORAL_TABLET | Freq: Every evening | ORAL | 2 refills | Status: DC | PRN

## 2019-04-22 MED ORDER — SERTRALINE HCL 25 MG PO TABS: ORAL_TABLET | ORAL | 3 refills | Status: DC

## 2019-04-22 NOTE — Telephone Encounter (Signed)
Routing to provider to send to different pharmacy.

## 2019-04-22 NOTE — Telephone Encounter (Signed)
[facility] [id number], [place] AT [facility] [address] [facility] Phone:  [phone]  Fax:  [phone]       Patient states medications sent today were sent to the wrong pharmacy.    sertraline (ZOLOFT) 25 MG tablet LORazepam (ATIVAN) 1 MG tablet

## 2019-04-22 NOTE — Telephone Encounter (Signed)
Pt already picked up her Rx- we will cancel her Rx just sent to [place]

## 2019-04-22 NOTE — Progress Notes (Signed)
BP (!) 150/64 (BP Location: Left Arm, Cuff Size: Normal)   Pulse 64   Temp 98.2 F (36.8 C) (Oral)   LMP  (LMP Unknown)   SpO2 99%    Subjective:    Patient ID: Daisy Watkins, female    DOB: 08-02-1932, 86 y.o.   MRN: [id number]  HPI: Daisy Watkins is a 86 y.o. female  Chief Complaint  Patient presents with  . Diabetes   Just feeling tired and having no energy. Vision is a little funny when she gets up in the AM. Sugar in the AM usually running a bit low. She notes that she has been feeling weird and not good. She thinks that the tresiba is making her feel this way.  INSOMNIA Duration: chronic Satisfied with sleep quality: yes Difficulty falling asleep: no Difficulty staying asleep: no Waking a few hours after sleep onset: no Early morning awakenings: no Daytime hypersomnolence: no Wakes feeling refreshed: yes Good sleep hygiene: yes Apnea: no Snoring: no Depressed/anxious mood: yes Recent stress: yes Restless legs/nocturnal leg cramps: no Chronic pain/arthritis: no History of sleep study: no Treatments attempted: melatonin, uinsom, benadryl and ambien   HYPERTENSION Hypertension status: uncontrolled  Satisfied with current treatment? yes Duration of hypertension: chronic BP monitoring frequency:  not checking BP range:  BP medication side effects:  no Medication compliance: excellent compliance Aspirin: no Recurrent headaches: no Visual changes: no Palpitations: no Dyspnea: no Chest pain: no Lower extremity edema: no Dizzy/lightheaded: yes  ATRIAL FIBRILLATION Atrial fibrillation status: stable Satisfied with current treatment: yes  Medication side effects:  no Medication compliance: excellent compliance Etiology of atrial fibrillation:  Palpitations:  no Chest pain:  no Dyspnea on exertion:  no Orthopnea:  no Syncope:  no Edema:  no Anti-coagulation: long acting   ANXIETY/STRESS Duration:stable Anxious mood: yes  Excessive worrying:  no Irritability: no  Sweating: no Nausea: no Palpitations:no Hyperventilation: no Panic attacks: no Agoraphobia: no  Obscessions/compulsions: no Depressed mood: yes Depression screen [facility] 2/9 04/25/2019 10/21/2018 07/21/2018 06/25/2018 01/20/2018  Decreased Interest 3 0 0 0 0  Down, Depressed, Hopeless 3 0 1 0 0  PHQ - 2 Score 6 0 1 0 0  Altered sleeping 3 3 3  - 3  Tired, decreased energy 3 1 0 - 3  Change in appetite 3 3 1  - 3  Feeling bad or failure about yourself  0 0 0 - 0  Trouble concentrating 0 0 0 - 0  Moving slowly or fidgety/restless 0 0 0 - 3  Suicidal thoughts 0 0 0 - 3  PHQ-9 Score 15 7 5  - 15  Difficult doing work/chores Very difficult - Not difficult at all - -  Some recent data might be hidden   Anhedonia: no Weight changes: no Insomnia: yes hard to fall asleep  Hypersomnia: no Fatigue/loss of energy: yes Feelings of worthlessness: no Feelings of guilt: no Impaired concentration/indecisiveness: no Suicidal ideations: no  Crying spells: no Recent Stressors/Life Changes: yes  Relevant past medical, surgical, family and social history reviewed and updated as indicated. Interim medical history since our last visit reviewed. Allergies and medications reviewed and updated.  Review of Systems  Constitutional: Negative.   Respiratory: Negative.   Cardiovascular: Negative.   Musculoskeletal: Positive for arthralgias and myalgias. Negative for back pain, gait problem, joint swelling, neck pain and neck stiffness.  Skin: Negative.   Psychiatric/Behavioral: Negative.     Per HPI unless specifically indicated above     Objective:    BP Marland Kitchen)  150/64 (BP Location: Left Arm, Cuff Size: Normal)   Pulse 64   Temp 98.2 F (36.8 C) (Oral)   LMP  (LMP Unknown)   SpO2 99%   Wt Readings from Last 3 Encounters:  01/21/19 201 lb (91.2 kg)  01/03/19 204 lb (92.5 kg)  11/04/18 200 lb (90.7 kg)    Physical Exam Vitals and nursing note reviewed.  Constitutional:       General: She is not in acute distress.    Appearance: Normal appearance. She is not ill-appearing, toxic-appearing or diaphoretic.  HENT:     Head: Normocephalic and atraumatic.     Right Ear: External ear normal.     Left Ear: External ear normal.     Nose: Nose normal.     Mouth/Throat:     Mouth: Mucous membranes are moist.     Pharynx: Oropharynx is clear.  Eyes:     General: No scleral icterus.       Right eye: No discharge.        Left eye: No discharge.     Extraocular Movements: Extraocular movements intact.     Conjunctiva/sclera: Conjunctivae normal.     Pupils: Pupils are equal, round, and reactive to light.  Cardiovascular:     Rate and Rhythm: Normal rate and regular rhythm.     Pulses: Normal pulses.     Heart sounds: Normal heart sounds. No murmur. No friction rub. No gallop.   Pulmonary:     Effort: Pulmonary effort is normal. No respiratory distress.     Breath sounds: Normal breath sounds. No stridor. No wheezing, rhonchi or rales.  Chest:     Chest wall: No tenderness.  Musculoskeletal:        General: Normal range of motion.     Cervical back: Normal range of motion and neck supple.  Skin:    General: Skin is warm and dry.     Capillary Refill: Capillary refill takes less than 2 seconds.     Coloration: Skin is not jaundiced or pale.     Findings: No bruising, erythema, lesion or rash.  Neurological:     General: No focal deficit present.     Mental Status: She is alert and oriented to person, place, and time. Mental status is at baseline.  Psychiatric:        Mood and Affect: Mood normal.        Behavior: Behavior normal.        Thought Content: Thought content normal.        Judgment: Judgment normal.     Results for orders placed or performed in visit on 04/22/19  CBC with Differential/Platelet  Result Value Ref Range   WBC 4.1 3.4 - 10.8 x10E3/uL   RBC 4.85 3.77 - 5.28 x10E6/uL   Hemoglobin 13.8 11.1 - 15.9 g/dL   Hematocrit 42.0 34.0 - 46.6  %   MCV 87 79 - 97 fL   MCH 28.5 26.6 - 33.0 pg   MCHC 32.9 31.5 - 35.7 g/dL   RDW 13.1 11.7 - 15.4 %   Platelets 188 150 - 450 x10E3/uL   Neutrophils 59 Not Estab. %   Lymphs 29 Not Estab. %   Monocytes 9 Not Estab. %   Eos 1 Not Estab. %   Basos 1 Not Estab. %   Neutrophils Absolute 2.5 1.4 - 7.0 x10E3/uL   Lymphocytes Absolute 1.2 0.7 - 3.1 x10E3/uL   Monocytes Absolute 0.4 0.1 - 0.9 x10E3/uL  EOS (ABSOLUTE) 0.0 0.0 - 0.4 x10E3/uL   Basophils Absolute 0.0 0.0 - 0.2 x10E3/uL   Immature Granulocytes 1 Not Estab. %   Immature Grans (Abs) 0.0 0.0 - 0.1 x10E3/uL  Bayer DCA Hb A1c Waived  Result Value Ref Range   HB A1C (BAYER DCA - WAIVED) 9.3 (H) <7.0 %  Comprehensive metabolic panel  Result Value Ref Range   Glucose 226 (H) 65 - 99 mg/dL   BUN 11 8 - 27 mg/dL   Creatinine, Ser 0.68 0.57 - 1.00 mg/dL   GFR calc non Af Amer 81 >59 mL/min/1.73   GFR calc Af Amer 93 >59 mL/min/1.73   BUN/Creatinine Ratio 16 12 - 28   Sodium 137 134 - 144 mmol/L   Potassium 4.2 3.5 - 5.2 mmol/L   Chloride 102 96 - 106 mmol/L   CO2 22 20 - 29 mmol/L   Calcium 9.7 8.7 - 10.3 mg/dL   Total Protein 6.8 6.0 - 8.5 g/dL   Albumin 3.9 3.6 - 4.6 g/dL   Globulin, Total 2.9 1.5 - 4.5 g/dL   Albumin/Globulin Ratio 1.3 1.2 - 2.2   Bilirubin Total 0.7 0.0 - 1.2 mg/dL   Alkaline Phosphatase 118 (H) 39 - 117 IU/L   AST 21 0 - 40 IU/L   ALT 24 0 - 32 IU/L  Lipid Panel w/o Chol/HDL Ratio  Result Value Ref Range   Cholesterol, Total 203 (H) 100 - 199 mg/dL   Triglycerides 145 0 - 149 mg/dL   HDL 52 >39 mg/dL   VLDL Cholesterol Cal 26 5 - 40 mg/dL   LDL Chol Calc (NIH) 125 (H) 0 - 99 mg/dL      Assessment & Plan:   Problem List Items Addressed This Visit      Cardiovascular and Mediastinum   Benign essential hypertension    Running high today. Will get mood and sugars under control and work on [employer]. Recheck 1 week.       Relevant Orders   CBC with Differential/Platelet (Completed)    Comprehensive metabolic panel (Completed)   Paroxysmal A-fib (HCC) - Primary    HR running OK. Not feeling well- likely due to DM medicine rather than eliquis. Checking CBC today. Call with any concerns.       Relevant Orders   CBC with Differential/Platelet (Completed)   Comprehensive metabolic panel (Completed)     Endocrine   Type 2 diabetes mellitus with hyperglycemia ([place])    Not feeling particularly well on tresiba. Will change to toujeo and recheck 1 week. Call with any concerns.       Relevant Orders   CBC with Differential/Platelet (Completed)   Bayer DCA Hb A1c Waived (Completed)   Comprehensive metabolic panel (Completed)     Other   Hyperlipidemia, mixed    Rechecking levels today. Await results. Treat as needed.       Relevant Orders   CBC with Differential/Platelet (Completed)   Comprehensive metabolic panel (Completed)   Lipid Panel w/o Chol/HDL Ratio (Completed)   Anxiety, generalized    Under good control on current regimen. Continue current regimen. Continue to monitor. Call with any concerns. Refills given. Labs drawn today.       Relevant Medications   LORazepam (ATIVAN) 1 MG tablet   sertraline (ZOLOFT) 25 MG tablet   Other Relevant Orders   CBC with Differential/Platelet (Completed)   Comprehensive metabolic panel (Completed)   Major depression    Under good control on current regimen. Continue current  regimen. Continue to monitor. Call with any concerns. Refills given. Labs drawn today.       Relevant Medications   LORazepam (ATIVAN) 1 MG tablet   sertraline (ZOLOFT) 25 MG tablet   Insomnia    Under good control on current regimen. Continue current regimen. Continue to monitor. Call with any concerns. Refills given for 3 months. Follow up 3 months.        Relevant Orders   CBC with Differential/Platelet (Completed)   Comprehensive metabolic panel (Completed)       Follow up plan: Return in about 1 week (around 04/29/2019).

## 2019-04-23 LAB — CBC WITH DIFFERENTIAL/PLATELET
Basophils Absolute: 0 10*3/uL (ref 0.0–0.2)
Basos: 1 %
EOS (ABSOLUTE): 0 10*3/uL (ref 0.0–0.4)
Eos: 1 %
Hematocrit: 42 % (ref 34.0–46.6)
Hemoglobin: 13.8 g/dL (ref 11.1–15.9)
Immature Grans (Abs): 0 10*3/uL (ref 0.0–0.1)
Immature Granulocytes: 1 %
Lymphocytes Absolute: 1.2 10*3/uL (ref 0.7–3.1)
Lymphs: 29 %
MCH: 28.5 pg (ref 26.6–33.0)
MCHC: 32.9 g/dL (ref 31.5–35.7)
MCV: 87 fL (ref 79–97)
Monocytes Absolute: 0.4 10*3/uL (ref 0.1–0.9)
Monocytes: 9 %
Neutrophils Absolute: 2.5 10*3/uL (ref 1.4–7.0)
Neutrophils: 59 %
Platelets: 188 10*3/uL (ref 150–450)
RBC: 4.85 x10E6/uL (ref 3.77–5.28)
RDW: 13.1 % (ref 11.7–15.4)
WBC: 4.1 10*3/uL (ref 3.4–10.8)

## 2019-04-23 LAB — COMPREHENSIVE METABOLIC PANEL
ALT: 24 IU/L (ref 0–32)
AST: 21 IU/L (ref 0–40)
Albumin/Globulin Ratio: 1.3 (ref 1.2–2.2)
Albumin: 3.9 g/dL (ref 3.6–4.6)
Alkaline Phosphatase: 118 IU/L — ABNORMAL HIGH (ref 39–117)
BUN/Creatinine Ratio: 16 (ref 12–28)
BUN: 11 mg/dL (ref 8–27)
Bilirubin Total: 0.7 mg/dL (ref 0.0–1.2)
CO2: 22 mmol/L (ref 20–29)
Calcium: 9.7 mg/dL (ref 8.7–10.3)
Chloride: 102 mmol/L (ref 96–106)
Creatinine, Ser: 0.68 mg/dL (ref 0.57–1.00)
GFR calc Af Amer: 93 mL/min/{1.73_m2} (ref >59)
GFR calc non Af Amer: 81 mL/min/{1.73_m2} (ref >59)
Globulin, Total: 2.9 g/dL (ref 1.5–4.5)
Glucose: 226 mg/dL — ABNORMAL HIGH (ref 65–99)
Potassium: 4.2 mmol/L (ref 3.5–5.2)
Sodium: 137 mmol/L (ref 134–144)
Total Protein: 6.8 g/dL (ref 6.0–8.5)

## 2019-04-23 LAB — LIPID PANEL W/O CHOL/HDL RATIO
Cholesterol, Total: 203 mg/dL — ABNORMAL HIGH (ref 100–199)
HDL: 52 mg/dL (ref >39)
LDL Chol Calc (NIH): 125 mg/dL — ABNORMAL HIGH (ref 0–99)
Triglycerides: 145 mg/dL (ref 0–149)
VLDL Cholesterol Cal: 26 mg/dL (ref 5–40)

## 2019-04-25 ENCOUNTER — Encounter: Payer: Self-pay | Admitting: Family Medicine

## 2019-04-25 NOTE — Assessment & Plan Note (Signed)
HR running OK. Not feeling well- likely due to DM medicine rather than eliquis. Checking CBC today. Call with any concerns.

## 2019-04-25 NOTE — Assessment & Plan Note (Signed)
Under good control on current regimen. Continue current regimen. Continue to monitor. Call with any concerns. Refills given. Labs drawn today.   

## 2019-04-25 NOTE — Assessment & Plan Note (Signed)
Rechecking levels today. Await results. Treat as needed.  

## 2019-04-25 NOTE — Assessment & Plan Note (Signed)
Not feeling particularly well on tresiba. Will change to toujeo and recheck 1 week. Call with any concerns.

## 2019-04-25 NOTE — Assessment & Plan Note (Signed)
Under good control on current regimen. Continue current regimen. Continue to monitor. Call with any concerns. Refills given for 3 months. Follow up 3 months.    

## 2019-04-25 NOTE — Assessment & Plan Note (Signed)
Running high today. Will get mood and sugars under control and work on [employer]. Recheck 1 week.

## 2019-04-27 ENCOUNTER — Ambulatory Visit (INDEPENDENT_AMBULATORY_CARE_PROVIDER_SITE_OTHER): Payer: Medicare HMO | Admitting: Pharmacist

## 2019-04-27 DIAGNOSIS — E1165 Type 2 diabetes mellitus with hyperglycemia: Secondary | ICD-10-CM | POA: Diagnosis not present

## 2019-04-27 DIAGNOSIS — I480 Paroxysmal atrial fibrillation: Secondary | ICD-10-CM | POA: Diagnosis not present

## 2019-04-27 DIAGNOSIS — Z794 Long term (current) use of insulin: Secondary | ICD-10-CM

## 2019-04-27 NOTE — Patient Instructions (Signed)
Visit Information  Goals Addressed            This Visit's Progress     Patient Stated   . PharmD "My blood sugars are good" (pt-stated)       Current Barriers:  . Diabetes: uncontrolled; most recent A1c 9.3% o Multiple recent medication changes caused confusion and stress.  . Current antihyperglycemic regimen: Tresiba 20 units QPM; glipizide 5 mg daily (not prescribed); was given prescription for Toujeo, but has not started yet because she wanted to finish [place] first.  . Also reports needing a prescription for pen needls.  . Current blood glucose readings:  o Reports all readings are >150 . Cardiovascular risk reduction: o Current hypertensive regimen: ramipril 15 mg daily, amlodipine 10 mg daily o Current hyperlipidemia regimen: none  Pharmacist Clinical Goal(s):  Marland Kitchen Over the next 90 days, patient will work with PharmD and primary care provider to address optimized medication management  Interventions: . Reviewed risk of hypoglycemia, and that it just takes time to find her appropriate insulin dose.  . Patient has appointment this Friday scheduled w/ PCP for insulin adjustment s/p switching to Toujeo, but she likely won't switch to [facility] for another ~4 days. Will collaborate w/ Dr. Wynetta Emery and see if she would like to have appointment deferred for another ~week . Will collaborate w/ Dr. Wynetta Emery for prescription for pen needles to [facility]  Patient Self Care Activities:  . Patient will check blood glucose BID, document, and provide at future appointments . Patient will contact provider with any episodes of hypoglycemia . Patient will report any questions or concerns to provider   Please see past updates related to this goal by clicking on the "Past Updates" button in the selected goal      . COMPLETED: PharmD "My INR has been difficult to control" (pt-stated)       Current Barriers:  . Polypharmacy; complex patient with multiple comorbidities including atrial  fibrillation, T2DM, HTN . Recent confusion regarding warfarin dose noted. Collaborated w/ Dr. Wynetta Emery, decided to switch to Eliquis.  . Today, patient notes that it took her a while to get comfortable w/ Eliquis therapy, but that she is pleased now that she isn't worrying about warfarin doses, food interactions, and INR checks  Pharmacist Clinical Goal(s):  Marland Kitchen Over the next 90 days, patient will work with PharmD and provider towards optimized medication management  Interventions: . Praised patient for being open minded about this new medication. Reiterated improved safety/reduced risk of bleeding, reduction in food/drug interactions. She verbalized understanding  Patient Self Care Activities:  . Patient will take medications as prescribed  Please see past updates related to this goal by clicking on the "Past Updates" button in the selected goal         The patient verbalized understanding of instructions provided today and declined a print copy of patient instruction materials.  Plan:  - Will collaborate w/ Dr. Wynetta Emery and scheduling team as above - Scheduled f/u call 05/24/19  Catie Darnelle Maffucci, PharmD, [place] [phone]

## 2019-04-27 NOTE — Chronic Care Management (AMB) (Signed)
Chronic Care Management   Follow Up Note   04/27/2019 Name: Daisy Watkins MRN: [id number] DOB: 08-31-1932  Referred by: Daisy Roys, DO Reason for referral : Chronic Care Management (Medication Management)   Daisy Watkins is a 86 y.o. year old female who is a primary care patient of Daisy Roys, DO. The CCM team was consulted for assistance with chronic disease management and care coordination needs.    Contacted patient for medication management review.   Review of patient status, including review of consultants reports, relevant laboratory and other test results, and collaboration with appropriate care team members and the patient's provider was performed as part of comprehensive patient evaluation and provision of chronic care management services.    SDOH (Social Determinants of Health) screening performed today: Stress. See Care Plan for related entries.   Outpatient Encounter Medications as of 04/27/2019  Medication Sig Note  . apixaban (ELIQUIS) 5 MG TABS tablet Take 1 tablet (5 mg total) by mouth 2 (two) times daily.   . ramipril (ALTACE) 10 MG capsule TAKE ONE CAPSULE BY MOUTH DAILY. TO BE TAKEN WITH HER 5 MG CAPSULE FOR A TOTAL OF 15 MG DAILY   . ramipril (ALTACE) 5 MG capsule TAKE ONE CAPSULE BY MOUTH DAILY WITH 10 MG CAPSULE FOR A TOTAL OF 15 MG DAILY   . acetaminophen (TYLENOL) 500 MG tablet Take 500 mg every 6 (six) hours as needed by mouth. As needed 02/13/2017: As needed.   Daisy Watkins amLODipine (NORVASC) 10 MG tablet Take 1 tablet (10 mg total) by mouth daily.   Daisy Watkins azelastine (OPTIVAR) 0.05 % ophthalmic solution Place 1 drop into both eyes 2 (two) times daily.   . Cholecalciferol (VITAMIN D3) 1000 units CAPS Take by mouth. Daily 12/30/2016: Per pt takes one to twice a week.   . erythromycin ophthalmic ointment Place 1 application into both eyes 2 (two) times daily as needed.   . fluticasone (FLONASE) 50 MCG/ACT nasal spray Place 2 sprays into both nostrils daily.   . Insulin  Degludec (TRESIBA) 100 UNIT/ML SOLN Inject 20 Units into the skin at bedtime. (Patient not taking: Reported on 04/27/2019)   . LORazepam (ATIVAN) 1 MG tablet Take 1 tablet (1 mg total) by mouth at bedtime as needed.   . magnesium oxide (MAG-OX) 400 MG tablet Take by mouth daily.    . meloxicam (MOBIC) 15 MG tablet Take 1 tablet (15 mg total) by mouth daily.   Daisy Watkins omeprazole (PRILOSEC) 40 MG capsule Take 1 capsule (40 mg total) by mouth daily.   . sertraline (ZOLOFT) 25 MG tablet 1/2 tab daily for 1 week, then 1 tab daily   . TRUETRACK TEST test strip    . vitamin C (ASCORBIC ACID) 500 MG tablet Take 500 mg by mouth daily. 02/13/2017: Twice a week   . [DISCONTINUED] Insulin Syringe-Needle U-100 (INSULIN SYRINGE .5CC/31GX5/16") 31G X 5/16" 0.5 ML MISC USE DAILY AS DIRECTED FOR DIABETES    No facility-administered encounter medications on file as of 04/27/2019.     Objective:   Goals Addressed            This Visit's Progress     Patient Stated   . PharmD "My blood sugars are good" (pt-stated)       Current Barriers:  . Diabetes: uncontrolled; most recent A1c 9.3% o Multiple recent medication changes caused confusion and stress.  . Current antihyperglycemic regimen: Tresiba 20 units QPM; glipizide 5 mg daily (not prescribed); was given prescription for  Toujeo, but has not started yet because she wanted to finish [place] first.  . Also reports needing a prescription for pen needls.  . Current blood glucose readings:  o Reports all readings are >150 . Cardiovascular risk reduction: o Current hypertensive regimen: ramipril 15 mg daily, amlodipine 10 mg daily o Current hyperlipidemia regimen: none  Pharmacist Clinical Goal(s):  Daisy Watkins Over the next 90 days, patient will work with PharmD and primary care provider to address optimized medication management  Interventions: . Reviewed risk of hypoglycemia, and that it just takes time to find her appropriate insulin dose.  . Patient has  appointment this Friday scheduled w/ PCP for insulin adjustment s/p switching to Toujeo, but she likely won't switch to [facility] for another ~4 days. Will collaborate w/ Dr. Wynetta Watkins and see if she would like to have appointment deferred for another ~week . Will collaborate w/ Dr. Wynetta Watkins for prescription for pen needles to [facility]  Patient Self Care Activities:  . Patient will check blood glucose BID, document, and provide at future appointments . Patient will contact provider with any episodes of hypoglycemia . Patient will report any questions or concerns to provider   Please see past updates related to this goal by clicking on the "Past Updates" button in the selected goal      . COMPLETED: PharmD "My INR has been difficult to control" (pt-stated)       Current Barriers:  . Polypharmacy; complex patient with multiple comorbidities including atrial fibrillation, T2DM, HTN . Recent confusion regarding warfarin dose noted. Collaborated w/ Dr. Wynetta Watkins, decided to switch to Eliquis.  . Today, patient notes that it took her a while to get comfortable w/ Eliquis therapy, but that she is pleased now that she isn't worrying about warfarin doses, food interactions, and INR checks  Pharmacist Clinical Goal(s):  Daisy Watkins Over the next 90 days, patient will work with PharmD and provider towards optimized medication management  Interventions: . Praised patient for being open minded about this new medication. Reiterated improved safety/reduced risk of bleeding, reduction in food/drug interactions. She verbalized understanding  Patient Self Care Activities:  . Patient will take medications as prescribed  Please see past updates related to this goal by clicking on the "Past Updates" button in the selected goal          Plan:  - Will collaborate w/ Dr. Wynetta Watkins and scheduling team as above - Scheduled f/u call 05/24/19  Daisy Watkins, PharmD, [place] [phone]

## 2019-04-29 ENCOUNTER — Ambulatory Visit: Payer: Medicare HMO | Admitting: Family Medicine

## 2019-04-29 ENCOUNTER — Encounter: Payer: Self-pay | Admitting: Family Medicine

## 2019-05-02 NOTE — Progress Notes (Signed)
Is it ok to use a same day slot?

## 2019-05-09 ENCOUNTER — Encounter: Payer: Self-pay | Admitting: Family Medicine

## 2019-05-09 ENCOUNTER — Ambulatory Visit: Payer: Self-pay | Admitting: Family Medicine

## 2019-05-09 ENCOUNTER — Ambulatory Visit (INDEPENDENT_AMBULATORY_CARE_PROVIDER_SITE_OTHER): Payer: Medicare HMO | Admitting: Family Medicine

## 2019-05-09 DIAGNOSIS — Z794 Long term (current) use of insulin: Secondary | ICD-10-CM

## 2019-05-09 DIAGNOSIS — R300 Dysuria: Secondary | ICD-10-CM | POA: Diagnosis not present

## 2019-05-09 DIAGNOSIS — E1165 Type 2 diabetes mellitus with hyperglycemia: Secondary | ICD-10-CM | POA: Diagnosis not present

## 2019-05-09 NOTE — Progress Notes (Signed)
Called pt to schedule f/u, no answer, no vm, sending letter.

## 2019-05-09 NOTE — Assessment & Plan Note (Signed)
Tolerating the tresiba well. Fasting sugars 78- will continue current regimen and recheck A1c in April. Call with any concerns.

## 2019-05-09 NOTE — Progress Notes (Signed)
LMP  (LMP Unknown)    Subjective:    Patient ID: Daisy Watkins, female    DOB: [birth year], 86 y.o.   MRN: [id number]  HPI: Daisy Watkins is a 86 y.o. female  Chief Complaint  Patient presents with  . Diabetes  . Urinary Tract Infection   Was feeling funny on her medicine before, but now feeling better. Wants to stay on her tresiba. Feels like she just needed time to get used to the medicine. She notes that her sugars have been running between 78-200.  DIABETES Hypoglycemic episodes:no Polydipsia/polyuria: no Visual disturbance: no Chest pain: no Paresthesias: no Glucose Monitoring: yes  Accucheck frequency: BID Taking Insulin?: yes Blood Pressure Monitoring: not checking Retinal Examination: Not up to Date Foot Exam: Up to Date Diabetic Education: Completed Pneumovax: Up to Date Influenza: Up to Date Aspirin: yes  URINARY SYMPTOMS Duration: 2 weeks Dysuria: yes Urinary frequency: yes Urgency: yes Small volume voids: no Symptom severity: moderate Urinary incontinence: no Foul odor: yes Hematuria: no Abdominal pain: no Back pain: no Suprapubic pain/pressure: no Flank pain: no Fever:  no Vomiting: no Relief with cranberry juice: no Relief with pyridium: no Status: better/worse/stable Previous urinary tract infection: yes Recurrent urinary tract infection: yes History of sexually transmitted disease: no Vaginal discharge: no Treatments attempted: increasing fluids   Relevant past medical, surgical, family and social history reviewed and updated as indicated. Interim medical history since our last visit reviewed. Allergies and medications reviewed and updated.  Review of Systems  Constitutional: Negative.   Respiratory: Negative.   Cardiovascular: Negative.   Gastrointestinal: Negative.   Genitourinary: Positive for dysuria, frequency and urgency. Negative for decreased urine volume, difficulty urinating, dyspareunia, enuresis, flank pain, genital sores,  hematuria, menstrual problem, pelvic pain, vaginal bleeding, vaginal discharge and vaginal pain.  Musculoskeletal: Negative.   Psychiatric/Behavioral: Negative.     Per HPI unless specifically indicated above     Objective:    LMP  (LMP Unknown)   Wt Readings from Last 3 Encounters:  01/21/19 201 lb (91.2 kg)  01/03/19 204 lb (92.5 kg)  11/04/18 200 lb (90.7 kg)    Physical Exam Vitals and nursing note reviewed.  Pulmonary:     Effort: Pulmonary effort is normal. No respiratory distress.     Comments: Speaking in full sentences Neurological:     Mental Status: She is alert.  Psychiatric:        Mood and Affect: Mood normal.        Behavior: Behavior normal.        Thought Content: Thought content normal.        Judgment: Judgment normal.     Results for orders placed or performed in visit on 04/22/19  CBC with Differential/Platelet  Result Value Ref Range   WBC 4.1 3.4 - 10.8 x10E3/uL   RBC 4.85 3.77 - 5.28 x10E6/uL   Hemoglobin 13.8 11.1 - 15.9 g/dL   Hematocrit 42.0 34.0 - 46.6 %   MCV 87 79 - 97 fL   MCH 28.5 26.6 - 33.0 pg   MCHC 32.9 31.5 - 35.7 g/dL   RDW 13.1 11.7 - 15.4 %   Platelets 188 150 - 450 x10E3/uL   Neutrophils 59 Not Estab. %   Lymphs 29 Not Estab. %   Monocytes 9 Not Estab. %   Eos 1 Not Estab. %   Basos 1 Not Estab. %   Neutrophils Absolute 2.5 1.4 - 7.0 x10E3/uL   Lymphocytes Absolute 1.2 0.7 -  3.1 x10E3/uL   Monocytes Absolute 0.4 0.1 - 0.9 x10E3/uL   EOS (ABSOLUTE) 0.0 0.0 - 0.4 x10E3/uL   Basophils Absolute 0.0 0.0 - 0.2 x10E3/uL   Immature Granulocytes 1 Not Estab. %   Immature Grans (Abs) 0.0 0.0 - 0.1 x10E3/uL  Bayer DCA Hb A1c Waived  Result Value Ref Range   HB A1C (BAYER DCA - WAIVED) 9.3 (H) <7.0 %  Comprehensive metabolic panel  Result Value Ref Range   Glucose 226 (H) 65 - 99 mg/dL   BUN 11 8 - 27 mg/dL   Creatinine, Ser 0.68 0.57 - 1.00 mg/dL   GFR calc non Af Amer 81 >59 mL/min/1.73   GFR calc Af Amer 93 >59  mL/min/1.73   BUN/Creatinine Ratio 16 12 - 28   Sodium 137 134 - 144 mmol/L   Potassium 4.2 3.5 - 5.2 mmol/L   Chloride 102 96 - 106 mmol/L   CO2 22 20 - 29 mmol/L   Calcium 9.7 8.7 - 10.3 mg/dL   Total Protein 6.8 6.0 - 8.5 g/dL   Albumin 3.9 3.6 - 4.6 g/dL   Globulin, Total 2.9 1.5 - 4.5 g/dL   Albumin/Globulin Ratio 1.3 1.2 - 2.2   Bilirubin Total 0.7 0.0 - 1.2 mg/dL   Alkaline Phosphatase 118 (H) 39 - 117 IU/L   AST 21 0 - 40 IU/L   ALT 24 0 - 32 IU/L  Lipid Panel w/o Chol/HDL Ratio  Result Value Ref Range   Cholesterol, Total 203 (H) 100 - 199 mg/dL   Triglycerides 145 0 - 149 mg/dL   HDL 52 >39 mg/dL   VLDL Cholesterol Cal 26 5 - 40 mg/dL   LDL Chol Calc (NIH) 125 (H) 0 - 99 mg/dL      Assessment & Plan:   Problem List Items Addressed This Visit      Endocrine   Type 2 diabetes mellitus with hyperglycemia ([place])    Tolerating the tresiba well. Fasting sugars 78- will continue current regimen and recheck A1c in April. Call with any concerns.        Other Visit Diagnoses    Dysuria    -  Primary   Will come in tomorrow to leave a urine. Will treat as needed based on results.    Relevant Orders   UA/M w/rflx Culture, Routine       Follow up plan: Return in about 2 months (around 07/21/2019) for follow up.   . This visit was completed via telephone due to the restrictions of the COVID-19 pandemic. All issues as above were discussed and addressed but no physical exam was performed. If it was felt that the patient should be evaluated in the office, they were directed there. The patient verbally consented to this visit. Patient was unable to complete an audio/visual visit due to Lack of equipment. Due to the catastrophic nature of the COVID-19 pandemic, this visit was done through audio contact only. . Location of the patient: home . Location of the provider: work . Those involved with this call:  . Provider: Park Liter, DO . CMA: Tiffany Reel, CMA . Front  Desk/Registration: Don Perking  . Time spent on call: 25 minutes on the phone discussing health concerns. 40 minutes total spent in review of patient's record and preparation of their chart.

## 2019-05-10 ENCOUNTER — Other Ambulatory Visit: Payer: Medicare HMO

## 2019-05-10 ENCOUNTER — Other Ambulatory Visit: Payer: Self-pay

## 2019-05-10 ENCOUNTER — Other Ambulatory Visit: Payer: Self-pay | Admitting: Family Medicine

## 2019-05-10 DIAGNOSIS — R300 Dysuria: Secondary | ICD-10-CM | POA: Diagnosis not present

## 2019-05-10 MED ORDER — NITROFURANTOIN MONOHYD MACRO 100 MG PO CAPS: 100.0000 mg | ORAL_CAPSULE | Freq: Two times a day (BID) | ORAL | 0 refills | Status: DC

## 2019-05-13 LAB — UA/M W/RFLX CULTURE, ROUTINE
Bilirubin, UA: NEGATIVE
Ketones, UA: NEGATIVE
Nitrite, UA: POSITIVE — AB
Protein,UA: NEGATIVE
Specific Gravity, UA: 1.01 (ref 1.005–1.030)
Urobilinogen, Ur: 0.2 mg/dL (ref 0.2–1.0)
pH, UA: 5 (ref 5.0–7.5)

## 2019-05-13 LAB — URINE CULTURE, REFLEX

## 2019-05-13 LAB — MICROSCOPIC EXAMINATION: WBC, UA: >30 /hpf — AB (ref 0–5)

## 2019-05-20 ENCOUNTER — Other Ambulatory Visit: Payer: Self-pay | Admitting: Family Medicine

## 2019-05-20 NOTE — Telephone Encounter (Signed)
Requested medication (s) are due for refill today - yes  Requested medication (s) are on the active medication list -yes  Future visit scheduled -yes  Last refill: 03/22/19  Notes to clinic: Patient is requesting refill of non delegated Rx  Requested Prescriptions  Pending Prescriptions Disp Refills   LORazepam (ATIVAN) 1 MG tablet [Pharmacy Med Name: LORAZEPAM 1MG  TABLETS] 30 tablet     Sig: TAKE 1 TABLET(1 MG) BY MOUTH AT BEDTIME AS NEEDED      Not Delegated - Psychiatry:  Anxiolytics/Hypnotics Failed - 05/20/2019  9:45 AM      Failed - This refill cannot be delegated      Failed - Urine Drug Screen completed in last 360 days.      Passed - Valid encounter within last 6 months    Recent Outpatient Visits           1 week ago [place], Megan P, DO   4 weeks ago Paroxysmal A-fib [facility])   [place], Megan P, DO   2 months ago Paroxysmal A-fib [facility])   [place], Megan P, DO   3 months ago Type 2 diabetes mellitus with hyperglycemia, with long-term current use of insulin ([place])   [facility], Megan P, DO   4 months ago Acute cystitis without hematuria   [facility], Megan P, DO                  Requested Prescriptions  Pending Prescriptions Disp Refills   LORazepam (ATIVAN) 1 MG tablet [Pharmacy Med Name: LORAZEPAM 1MG  TABLETS] 30 tablet     Sig: TAKE 1 TABLET(1 MG) BY MOUTH AT BEDTIME AS NEEDED      Not Delegated - Psychiatry:  Anxiolytics/Hypnotics Failed - 05/20/2019  9:45 AM      Failed - This refill cannot be delegated      Failed - Urine Drug Screen completed in last 360 days.      Passed - Valid encounter within last 6 months    Recent Outpatient Visits           1 week ago [place], Megan P, DO   4 weeks ago Paroxysmal A-fib [facility])   [place], Megan P, DO   2 months ago  Paroxysmal A-fib [facility])   [place], Megan P, DO   3 months ago Type 2 diabetes mellitus with hyperglycemia, with long-term current use of insulin ([place])   [place], Megan P, DO   4 months ago Acute cystitis without hematuria   [place], [place], DO

## 2019-05-20 NOTE — Telephone Encounter (Signed)
Routing to provider  

## 2019-05-20 NOTE — Telephone Encounter (Signed)
Patient has 2 refills on that medicine and is NOT due for a refill today. Last fill was 1/22- so should be ready in a couple of days.

## 2019-05-24 ENCOUNTER — Telehealth: Payer: Self-pay

## 2019-05-24 ENCOUNTER — Ambulatory Visit: Payer: Self-pay | Admitting: Pharmacist

## 2019-05-24 NOTE — Chronic Care Management (AMB) (Signed)
  Chronic Care Management   Note  05/24/2019 Name: Daisy Watkins MRN: [id number] DOB: 08-May-1932  Daisy Watkins is a 86 y.o. year old female who is a primary care patient of Valerie Roys, DO. The CCM team was consulted for assistance with chronic disease management and care coordination needs.    Attempted to contact patient for medication management follow up. Left HIPAA compliant message for her to return my call at her convenience.   Follow up plan: - Will collaborate w/ Care Guide to attempt to outreach to schedule follow up  [place], PharmD, [place] [phone]

## 2019-05-25 ENCOUNTER — Telehealth: Payer: Self-pay | Admitting: Family Medicine

## 2019-05-25 NOTE — Chronic Care Management (AMB) (Signed)
°  Care Management   Note  05/25/2019 Name: Daisy Watkins MRN: [id number] DOB: November 28, 1932  Daisy Watkins is a 86 y.o. year old female who is a primary care patient of Valerie Roys, DO and is actively engaged with the care management team. I reached out to [name] by phone today to assist with re-scheduling a follow up visit with the Pharmacist  Follow up plan: Telephone appointment with care management team member scheduled for:07/19/2019  Noreene Larsson, [place], [place], [place] [address] Direct Dial: [phone] [email or web address] Website: [email or web address]

## 2019-05-25 NOTE — Chronic Care Management (AMB) (Signed)
  Chronic Care Management   Outreach Note  05/25/2019 Name: Daisy Watkins MRN: [id number] DOB: [birth year]  ZOEYANN GRAPER is a 86 y.o. year old female who is a primary care patient of Valerie Roys, DO. I reached out to [employer] by phone today in response to a referral sent by Ms. Santa Lighter Hipps's health plan.     An unsuccessful telephone outreach was attempted today. The patient was referred to the case management team for assistance with care management and care coordination.   Follow Up Plan: The care management team will reach out to the patient again over the next 7 days.  If patient returns call to provider office, please advise to call [place] at [place], [place], [place], [place] [address] Direct Dial: [phone] [email or web address] Website: [email or web address]

## 2019-06-03 ENCOUNTER — Ambulatory Visit: Payer: Self-pay

## 2019-06-03 NOTE — Chronic Care Management (AMB) (Signed)
  Care Management   Follow Up Note   06/03/2019 Name: Daisy Watkins MRN: [id number] DOB: 31-Jul-1932  Referred by: Valerie Roys, DO Reason for referral : Care Coordination   Daisy Watkins is a 86 y.o. year old female who is a primary care patient of Valerie Roys, DO. The care management team was consulted for assistance with care management and care coordination needs.    Review of patient status, including review of consultants reports, relevant laboratory and other test results, and collaboration with appropriate care team members and the patient's provider was performed as part of comprehensive patient evaluation and provision of chronic care management services.    LCSW completed CCM outreach attempt today but was unable to reach patient successfully. A HIPPA compliant voice message was left encouraging patient to return call once available. LCSW rescheduled CCM SW appointment as well.  The care management team will reach out to the patient again over the next month.  Eula Fried, BSW, MSW, [facility] Care Management [email or web address] Phone: [phone]

## 2019-06-21 ENCOUNTER — Other Ambulatory Visit: Payer: Self-pay | Admitting: Family Medicine

## 2019-06-21 DIAGNOSIS — I10 Essential (primary) hypertension: Secondary | ICD-10-CM

## 2019-07-19 ENCOUNTER — Ambulatory Visit (INDEPENDENT_AMBULATORY_CARE_PROVIDER_SITE_OTHER): Payer: Medicare HMO | Admitting: Pharmacist

## 2019-07-19 DIAGNOSIS — I480 Paroxysmal atrial fibrillation: Secondary | ICD-10-CM

## 2019-07-19 DIAGNOSIS — Z794 Long term (current) use of insulin: Secondary | ICD-10-CM | POA: Diagnosis not present

## 2019-07-19 DIAGNOSIS — E1165 Type 2 diabetes mellitus with hyperglycemia: Secondary | ICD-10-CM

## 2019-07-19 DIAGNOSIS — F411 Generalized anxiety disorder: Secondary | ICD-10-CM

## 2019-07-19 NOTE — Patient Instructions (Addendum)
Visit Information  Ms. Cratty,   It was great talking to you today!  Try calling the customer service number on the back of your [employer] card to inquire if you have "Over the Counter Benefits" through your plan, or any other way to obtain a Blood Pressure Machine from [facility]. If not, I would recommend considering purchasing one from the pharmacy (I would recommend an arm cuff over a wrist cuff, wrist cuffs are often less accurate).  Please call with any questions!  Visit Information  Goals Addressed            This Visit's Progress     Patient Stated   . COMPLETED: PharmD "My blood sugars are good" (pt-stated)       Current Barriers:  . Diabetes: uncontrolled; most recent A1c 9.3% o Does report some fatigue and constipation that she attributes to the "new medication", but cannot specify whether the Eliquis or Tresiba.  o Notes that she self d/c sertraline because it "made her feel sick" . Current antihyperglycemic regimen: Tresiba 20 units QPM; glipizide XL 10 mg QAM - self-restarted this medication when she stopped Jardiance . Denies any episodes of hypoglycemia . Current blood glucose readings:  o Fastings: as low as 98, but sometimes up to 170; usually >130 but <150 o After meals: highest as 199; usually <200, but unable to verbalize that <180 . Cardiovascular risk reduction: o Current hypertensive regimen: ramipril 15 mg daily, amlodipine 10 mg daily; she notes that she isn't taking her BP at home as she does not have a meter  o Current hyperlipidemia regimen: none . Depression/anxiety sertraline 50 mg - self d/c; lorazepam 1 mg QPM PRN - patient takes QPM for sleep . GERD: omeprazole 40 mg daily . Pain: meloxicam 15 mg daily  Pharmacist Clinical Goal(s):  Marland Kitchen Over the next 90 days, patient will work with PharmD and primary care provider to address optimized medication management  Interventions: . Comprehensive medication review performed, medication list updated in  electronic medical record . Inter-disciplinary care team collaboration (see longitudinal plan of care) . Reviewed likely goal BP fasting ~<150, post prandial ~>180-200 for more relaxed A1c goal, given patient age and hx intolerances to multiple medications. Patient plans to discuss her concerns w/ Dr. Wynetta Emery at upcoming appointment later this week . Reviewed risk of hypoglycemia with XR sulfonylurea, however, patient is denying any hypoglycemia. Could be that medication does not have much benefit anymore, given her age and duration of diabetes. Encouraged her to discuss w/ PCP that she self-restarted this medication.  . Discussed that patient can investigate if she has OTC benefit through [facility] with which she could purchase a BP machine. Will type up these instructions and mail to her.   Patient Self Care Activities:  . Patient will check blood glucose BID, document, and provide at future appointments . Patient will contact provider with any episodes of hypoglycemia . Patient will report any questions or concerns to provider   Please see past updates related to this goal by clicking on the "Past Updates" button in the selected goal         Patient verbalizes understanding of instructions provided today.   Plan:  - Patient denied any further pharmacy needs at this time, and would prefer to discuss needs w/ PCP directly. Encouraged her to reach out with any future questions or concerns.   Catie Darnelle Maffucci, PharmD, [place] [phone]

## 2019-07-19 NOTE — Chronic Care Management (AMB) (Signed)
Chronic Care Management   Follow Up Note   07/19/2019 Name: Daisy Watkins MRN: [id number] DOB: [birth year]  Referred by: Valerie Roys, DO Reason for referral : Chronic Care Management (Medication Management)   Daisy Watkins is a 86 y.o. year old female who is a primary care patient of Valerie Roys, DO. The CCM team was consulted for assistance with chronic disease management and care coordination needs.    Contacted patient for medication management needs.   Review of patient status, including review of consultants reports, relevant laboratory and other test results, and collaboration with appropriate care team members and the patient's provider was performed as part of comprehensive patient evaluation and provision of chronic care management services.    SDOH (Social Determinants of Health) assessments performed: No See Care Plan activities for detailed interventions related to [facility])     Outpatient Encounter Medications as of 07/19/2019  Medication Sig Note  . acetaminophen (TYLENOL) 500 MG tablet Take 500 mg every 6 (six) hours as needed by mouth. As needed 02/13/2017: As needed.   Marland Kitchen amLODipine (NORVASC) 10 MG tablet Take 1 tablet (10 mg total) by mouth daily.   Marland Kitchen apixaban (ELIQUIS) 5 MG TABS tablet Take 1 tablet (5 mg total) by mouth 2 (two) times daily.   Marland Kitchen azelastine (OPTIVAR) 0.05 % ophthalmic solution Place 1 drop into both eyes 2 (two) times daily.   . Cholecalciferol (VITAMIN D3) 1000 units CAPS Take by mouth. Daily 12/30/2016: Per pt takes one to twice a week.   . erythromycin ophthalmic ointment Place 1 application into both eyes 2 (two) times daily as needed.   . fluticasone (FLONASE) 50 MCG/ACT nasal spray Place 2 sprays into both nostrils daily.   Marland Kitchen glipiZIDE (GLUCOTROL XL) 10 MG 24 hr tablet  07/19/2019: She is taking, even though it was discontinued   . Insulin Degludec (TRESIBA) 100 UNIT/ML SOLN Inject 20 Units into the skin at bedtime.   Marland Kitchen LORazepam (ATIVAN) 1 MG  tablet Take 1 tablet (1 mg total) by mouth at bedtime as needed.   . magnesium oxide (MAG-OX) 400 MG tablet Take by mouth daily.    . meloxicam (MOBIC) 15 MG tablet Take 1 tablet (15 mg total) by mouth daily.   Marland Kitchen omeprazole (PRILOSEC) 40 MG capsule Take 1 capsule (40 mg total) by mouth daily.   . ramipril (ALTACE) 10 MG capsule TAKE 1 CAPSULE(10 MG TOTAL) BY MOUTH DAILY.   . ramipril (ALTACE) 5 MG capsule TAKE ONE CAPSULE BY MOUTH DAILY WITH 10 MG CAPSULE FOR A TOTAL OF 15 MG DAILY   . vitamin C (ASCORBIC ACID) 500 MG tablet Take 500 mg by mouth daily. 02/13/2017: Twice a week   . sertraline (ZOLOFT) 25 MG tablet 1/2 tab daily for 1 week, then 1 tab daily (Patient not taking: Reported on 07/19/2019)   . TRUETRACK TEST test strip    . [DISCONTINUED] nitrofurantoin, macrocrystal-monohydrate, (MACROBID) 100 MG capsule Take 1 capsule (100 mg total) by mouth 2 (two) times daily.   . [DISCONTINUED] ramipril (ALTACE) 10 MG capsule TAKE 1 CAPSULE(10 MG) BY MOUTH DAILY    No facility-administered encounter medications on file as of 07/19/2019.     Objective:   Goals Addressed            This Visit's Progress     Patient Stated   . COMPLETED: PharmD "My blood sugars are good" (pt-stated)       Current Barriers:  . Diabetes: uncontrolled; most recent  A1c 9.3% o Does report some fatigue and constipation that she attributes to the "new medication", but cannot specify whether the Eliquis or Tresiba.  o Notes that she self d/c sertraline because it "made her feel sick" . Current antihyperglycemic regimen: Tresiba 20 units QPM; glipizide XL 10 mg QAM - self-restarted this medication when she stopped Jardiance . Denies any episodes of hypoglycemia . Current blood glucose readings:  o Fastings: as low as 98, but sometimes up to 170; usually >130 but <150 o After meals: highest as 199; usually <200, but unable to verbalize that <180 . Cardiovascular risk reduction: o Current hypertensive regimen:  ramipril 15 mg daily, amlodipine 10 mg daily; she notes that she isn't taking her BP at home as she does not have a meter  o Current hyperlipidemia regimen: none . Depression/anxiety sertraline 50 mg - self d/c; lorazepam 1 mg QPM PRN - patient takes QPM for sleep . GERD: omeprazole 40 mg daily . Pain: meloxicam 15 mg daily  Pharmacist Clinical Goal(s):  Marland Kitchen Over the next 90 days, patient will work with PharmD and primary care provider to address optimized medication management  Interventions: . Comprehensive medication review performed, medication list updated in electronic medical record . Inter-disciplinary care team collaboration (see longitudinal plan of care) . Reviewed likely goal BP fasting ~<150, post prandial ~>180-200 for more relaxed A1c goal, given patient age and hx intolerances to multiple medications. Patient plans to discuss her concerns w/ Dr. Wynetta Emery at upcoming appointment later this week . Reviewed risk of hypoglycemia with XR sulfonylurea, however, patient is denying any hypoglycemia. Could be that medication does not have much benefit anymore, given her age and duration of diabetes. Encouraged her to discuss w/ PCP that she self-restarted this medication.  . Discussed that patient can investigate if she has OTC benefit through [facility] with which she could purchase a BP machine. Will type up these instructions and mail to her.   Patient Self Care Activities:  . Patient will check blood glucose BID, document, and provide at future appointments . Patient will contact provider with any episodes of hypoglycemia . Patient will report any questions or concerns to provider   Please see past updates related to this goal by clicking on the "Past Updates" button in the selected goal          Plan:  - Patient denied any further pharmacy needs at this time, and would prefer to discuss needs w/ PCP directly. Encouraged her to reach out with any future questions or concerns.    Catie Darnelle Maffucci, PharmD, [place] [phone]

## 2019-07-21 ENCOUNTER — Other Ambulatory Visit: Payer: Self-pay

## 2019-07-21 ENCOUNTER — Ambulatory Visit (INDEPENDENT_AMBULATORY_CARE_PROVIDER_SITE_OTHER): Payer: Medicare HMO | Admitting: Family Medicine

## 2019-07-21 ENCOUNTER — Encounter: Payer: Self-pay | Admitting: Family Medicine

## 2019-07-21 VITALS — BP 127/73 | HR 89 | Temp 97.9°F | Wt 194.0 lb

## 2019-07-21 DIAGNOSIS — F322 Major depressive disorder, single episode, severe without psychotic features: Secondary | ICD-10-CM | POA: Diagnosis not present

## 2019-07-21 DIAGNOSIS — I10 Essential (primary) hypertension: Secondary | ICD-10-CM | POA: Diagnosis not present

## 2019-07-21 DIAGNOSIS — K219 Gastro-esophageal reflux disease without esophagitis: Secondary | ICD-10-CM | POA: Diagnosis not present

## 2019-07-21 DIAGNOSIS — E1165 Type 2 diabetes mellitus with hyperglycemia: Secondary | ICD-10-CM

## 2019-07-21 DIAGNOSIS — I700 Atherosclerosis of aorta: Secondary | ICD-10-CM | POA: Diagnosis not present

## 2019-07-21 DIAGNOSIS — F411 Generalized anxiety disorder: Secondary | ICD-10-CM

## 2019-07-21 DIAGNOSIS — F5101 Primary insomnia: Secondary | ICD-10-CM | POA: Diagnosis not present

## 2019-07-21 DIAGNOSIS — I480 Paroxysmal atrial fibrillation: Secondary | ICD-10-CM

## 2019-07-21 DIAGNOSIS — R300 Dysuria: Secondary | ICD-10-CM

## 2019-07-21 DIAGNOSIS — E782 Mixed hyperlipidemia: Secondary | ICD-10-CM

## 2019-07-21 DIAGNOSIS — Z794 Long term (current) use of insulin: Secondary | ICD-10-CM

## 2019-07-21 MED ORDER — AMLODIPINE BESYLATE 10 MG PO TABS: 10.0000 mg | ORAL_TABLET | Freq: Every day | ORAL | 1 refills | Status: DC

## 2019-07-21 MED ORDER — RAMIPRIL 5 MG PO CAPS: ORAL_CAPSULE | ORAL | 1 refills | Status: DC

## 2019-07-21 MED ORDER — APIXABAN 5 MG PO TABS: 5.0000 mg | ORAL_TABLET | Freq: Two times a day (BID) | ORAL | 1 refills | Status: DC

## 2019-07-21 MED ORDER — OMEPRAZOLE 40 MG PO CPDR: 40.0000 mg | DELAYED_RELEASE_CAPSULE | Freq: Every day | ORAL | 1 refills | Status: DC

## 2019-07-21 MED ORDER — FLUTICASONE PROPIONATE 50 MCG/ACT NA SUSP: 2.0000 | Freq: Every day | NASAL | 12 refills | Status: DC

## 2019-07-21 MED ORDER — SERTRALINE HCL 25 MG PO TABS: 25.0000 mg | ORAL_TABLET | Freq: Every day | ORAL | 1 refills | Status: DC

## 2019-07-21 MED ORDER — TRESIBA 100 UNIT/ML ~~LOC~~ SOLN: 24.0000 [IU] | Freq: Every day | SUBCUTANEOUS | 3 refills | Status: DC

## 2019-07-21 MED ORDER — RAMIPRIL 10 MG PO CAPS: ORAL_CAPSULE | ORAL | 1 refills | Status: DC

## 2019-07-21 MED ORDER — LORAZEPAM 1 MG PO TABS: 1.0000 mg | ORAL_TABLET | Freq: Every evening | ORAL | 2 refills | Status: DC | PRN

## 2019-07-21 NOTE — Assessment & Plan Note (Signed)
Under good control on current regimen. Continue current regimen. Continue to monitor. Call with any concerns. Refills given for 3 months. Follow up in 3 months.   

## 2019-07-21 NOTE — Progress Notes (Signed)
BP 127/73 (BP Location: Right Arm, Cuff Size: Normal)   Pulse 89   Temp 97.9 F (36.6 C) (Oral)   Wt 194 lb (88 kg)   LMP  (LMP Unknown)   SpO2 97%   BMI 35.48 kg/m    Subjective:    Patient ID: Daisy Watkins, female    DOB: Jul 18, 1932, 86 y.o.   MRN: [id number]  HPI: Daisy Watkins is a 86 y.o. female  Chief Complaint  Patient presents with  . Diabetes  . Dysuria   VAGINAL IRRITATION Duration: chronic Pruritus: yes Dysuria: no Malodorous: no Urinary frequency: yes Fevers: no Abdominal pain: no  Sexual activity: not sexually active History of sexually transmitted diseases: no Recent antibiotic use: no Context: uncontrolled DM  Treatments attempted: none  HYPERTENSION / HYPERLIPIDEMIA Satisfied with current treatment? yes Duration of hypertension: chronic BP monitoring frequency: not checking BP medication side effects: no Duration of hyperlipidemia: chronic Cholesterol medication side effects: no Cholesterol supplements: none Past cholesterol medications: none Medication compliance: good compliance Aspirin: no Recent stressors: no Recurrent headaches: no Visual changes: no Palpitations: no Dyspnea: no Chest pain: no Lower extremity edema: no Dizzy/lightheaded: yes  DIABETES Hypoglycemic episodes:no Polydipsia/polyuria: yes Visual disturbance: no Chest pain: no Paresthesias: yes Glucose Monitoring: yes  Accucheck frequency: Daily  Fasting glucose: 80-200 Taking Insulin?: yes Blood Pressure Monitoring: not checking Retinal Examination: Not up to Date Foot Exam: Not up to Date Diabetic Education: Completed Pneumovax: Up to Date Influenza: Up to Date Aspirin: no  ANXIETY/DEPRESSION Duration:stable Anxious mood: yes  Excessive worrying: yes Irritability: no  Sweating: no Nausea: no Palpitations:no Hyperventilation: no Panic attacks: no Agoraphobia: no  Obscessions/compulsions: yes Depressed mood: yes Depression screen [facility] 2/9 04/25/2019  10/21/2018 07/21/2018 06/25/2018 01/20/2018  Decreased Interest 3 0 0 0 0  Down, Depressed, Hopeless 3 0 1 0 0  PHQ - 2 Score 6 0 1 0 0  Altered sleeping 3 3 3  - 3  Tired, decreased energy 3 1 0 - 3  Change in appetite 3 3 1  - 3  Feeling bad or failure about yourself  0 0 0 - 0  Trouble concentrating 0 0 0 - 0  Moving slowly or fidgety/restless 0 0 0 - 3  Suicidal thoughts 0 0 0 - 3  PHQ-9 Score 15 7 5  - 15  Difficult doing work/chores Very difficult - Not difficult at all - -  Some recent data might be hidden   Anhedonia: no Weight changes: no Insomnia: yes   Hypersomnia: no Fatigue/loss of energy: yes Feelings of worthlessness: no Feelings of guilt: no Impaired concentration/indecisiveness: no Suicidal ideations: no  Crying spells: no Recent Stressors/Life Changes: no   Relationship problems: no   Family stress: no     Financial stress: no    Job stress: no    Recent death/loss: no  GERD GERD control status: stable  Satisfied with current treatment? yes Medication side effects: no  Medication compliance: excellent Dysphagia: no Odynophagia:  no Hematemesis: no Blood in stool: no EGD: no  INSOMNIA Duration: chronic Satisfied with sleep quality: yes Difficulty falling asleep: no Difficulty staying asleep: no Waking a few hours after sleep onset: no Early morning awakenings: no Daytime hypersomnolence: no Wakes feeling refreshed: yes Good sleep hygiene: yes Apnea: no Snoring: no Depressed/anxious mood: yes Recent stress: yes Restless legs/nocturnal leg cramps: no Chronic pain/arthritis: no History of sleep study: yes Treatments attempted: lorazepam, melatonin, uinsom and benadryl    Relevant past medical, surgical, family and  social history reviewed and updated as indicated. Interim medical history since our last visit reviewed. Allergies and medications reviewed and updated.  Review of Systems  Constitutional: Negative.   Respiratory: Negative.     Cardiovascular: Negative.   Gastrointestinal: Negative.   Neurological: Negative.   Hematological: Negative.   Psychiatric/Behavioral: Negative.     Per HPI unless specifically indicated above     Objective:    BP 127/73 (BP Location: Right Arm, Cuff Size: Normal)   Pulse 89   Temp 97.9 F (36.6 C) (Oral)   Wt 194 lb (88 kg)   LMP  (LMP Unknown)   SpO2 97%   BMI 35.48 kg/m   Wt Readings from Last 3 Encounters:  07/21/19 194 lb (88 kg)  01/21/19 201 lb (91.2 kg)  01/03/19 204 lb (92.5 kg)    Physical Exam Vitals and nursing note reviewed.  Constitutional:      General: She is not in acute distress.    Appearance: Normal appearance. She is not ill-appearing, toxic-appearing or diaphoretic.  HENT:     Head: Normocephalic and atraumatic.     Right Ear: External ear normal.     Left Ear: External ear normal.     Nose: Nose normal.     Mouth/Throat:     Mouth: Mucous membranes are moist.     Pharynx: Oropharynx is clear.  Eyes:     General: No scleral icterus.       Right eye: No discharge.        Left eye: No discharge.     Extraocular Movements: Extraocular movements intact.     Conjunctiva/sclera: Conjunctivae normal.     Pupils: Pupils are equal, round, and reactive to light.  Cardiovascular:     Rate and Rhythm: Normal rate and regular rhythm.     Pulses: Normal pulses.     Heart sounds: Normal heart sounds. No murmur. No friction rub. No gallop.   Pulmonary:     Effort: Pulmonary effort is normal. No respiratory distress.     Breath sounds: Normal breath sounds. No stridor. No wheezing, rhonchi or rales.  Chest:     Chest wall: No tenderness.  Musculoskeletal:        General: Normal range of motion.     Cervical back: Normal range of motion and neck supple.  Skin:    General: Skin is warm and dry.     Capillary Refill: Capillary refill takes less than 2 seconds.     Coloration: Skin is not jaundiced or pale.     Findings: No bruising, erythema, lesion  or rash.  Neurological:     General: No focal deficit present.     Mental Status: She is alert and oriented to person, place, and time. Mental status is at baseline.  Psychiatric:        Mood and Affect: Mood normal.        Behavior: Behavior normal.        Thought Content: Thought content normal.        Judgment: Judgment normal.     Results for orders placed or performed in visit on 05/10/19  Microscopic Examination   URINE  Result Value Ref Range   WBC, UA >30 (A) 0 - 5 /hpf   RBC 11-30 (A) 0 - 2 /hpf   Epithelial Cells (non renal) 0-10 0 - 10 /hpf   Bacteria, UA Many (A) None seen/Few  Urine Culture, Reflex   URINE  Result Value Ref Range  Urine Culture, Routine Final report (A)    Organism ID, Bacteria Escherichia coli (A)    Antimicrobial Susceptibility Comment   UA/M w/rflx Culture, Routine   Specimen: Urine   URINE  Result Value Ref Range   Specific Gravity, UA 1.010 1.005 - 1.030   pH, UA 5.0 5.0 - 7.5   Color, UA Yellow Yellow   Appearance Ur Cloudy (A) Clear   Leukocytes,UA 1+ (A) Negative   Protein,UA Negative Negative/Trace   Glucose, UA 3+ (A) Negative   Ketones, UA Negative Negative   RBC, UA 3+ (A) Negative   Bilirubin, UA Negative Negative   Urobilinogen, Ur 0.2 0.2 - 1.0 mg/dL   Nitrite, UA Positive (A) Negative   Microscopic Examination See below:    Urinalysis Reflex Comment       Assessment & Plan:   Problem List Items Addressed This Visit      Cardiovascular and Mediastinum   Aortic atherosclerosis (HCC) - Primary (Chronic)    Will keep BP, cholesterol and sugars under good control. Continue to monitor. Call with any concerns.       Relevant Medications   aspirin EC 81 MG tablet   amLODipine (NORVASC) 10 MG tablet   apixaban (ELIQUIS) 5 MG TABS tablet   ramipril (ALTACE) 10 MG capsule   ramipril (ALTACE) 5 MG capsule   Other Relevant Orders   Comprehensive metabolic panel   TSH   Benign essential hypertension    Under good  control on current regimen. Continue current regimen. Continue to monitor. Call with any concerns. Refills given. Labs drawn today.       Relevant Medications   aspirin EC 81 MG tablet   amLODipine (NORVASC) 10 MG tablet   apixaban (ELIQUIS) 5 MG TABS tablet   ramipril (ALTACE) 10 MG capsule   ramipril (ALTACE) 5 MG capsule   Other Relevant Orders   Comprehensive metabolic panel   Microalbumin, Urine Waived   TSH   Paroxysmal A-fib (HCC)    In sinus rhythm today. Continue current regimen. Continue to monitor. Refills given today.      Relevant Medications   aspirin EC 81 MG tablet   amLODipine (NORVASC) 10 MG tablet   apixaban (ELIQUIS) 5 MG TABS tablet   ramipril (ALTACE) 10 MG capsule   ramipril (ALTACE) 5 MG capsule   Other Relevant Orders   Comprehensive metabolic panel   CBC with Differential/Platelet   TSH     Digestive   GERD (gastroesophageal reflux disease)    Under good control on current regimen. Continue current regimen. Continue to monitor. Call with any concerns. Refills given. Labs drawn today.       Relevant Medications   omeprazole (PRILOSEC) 40 MG capsule   Other Relevant Orders   Comprehensive metabolic panel   TSH     Endocrine   Type 2 diabetes mellitus with hyperglycemia ([place])    Not under good control with A1c of 9.7- will increase her tresiba to 24 units and recheck by phone in 1 week. Adjust dose as needed.       Relevant Medications   aspirin EC 81 MG tablet   Insulin Degludec (TRESIBA) 100 UNIT/ML SOLN   ramipril (ALTACE) 10 MG capsule   ramipril (ALTACE) 5 MG capsule   Other Relevant Orders   Bayer DCA Hb A1c Waived   Comprehensive metabolic panel   TSH     Other   Hyperlipidemia, mixed    Under good control on current regimen. Continue current  regimen. Continue to monitor. Call with any concerns. Refills given. Labs drawn today.       Relevant Medications   aspirin EC 81 MG tablet   amLODipine (NORVASC) 10 MG tablet    apixaban (ELIQUIS) 5 MG TABS tablet   ramipril (ALTACE) 10 MG capsule   ramipril (ALTACE) 5 MG capsule   Other Relevant Orders   Comprehensive metabolic panel   Lipid Panel w/o Chol/HDL Ratio   TSH   Anxiety, generalized    Under good control on current regimen. Continue current regimen. Continue to monitor. Call with any concerns. Refills given for 3 months. Follow up in 3 months.         Relevant Medications   LORazepam (ATIVAN) 1 MG tablet   sertraline (ZOLOFT) 25 MG tablet   Other Relevant Orders   Comprehensive metabolic panel   TSH   Major depression    Under good control on current regimen. Continue current regimen. Continue to monitor. Call with any concerns. Refills given for 3 months. Follow up in 3 months.        Relevant Medications   LORazepam (ATIVAN) 1 MG tablet   sertraline (ZOLOFT) 25 MG tablet   Other Relevant Orders   Comprehensive metabolic panel   TSH   Insomnia    Under good control on current regimen. Continue current regimen. Continue to monitor. Call with any concerns. Refills given for 3 months. Follow up in 3 months.        Relevant Orders   Comprehensive metabolic panel   TSH    Other Visit Diagnoses    Dysuria       3+ glucose and trace leuks. Await culture.    Relevant Orders   UA/M w/rflx Culture, Routine       Follow up plan: Return 1 week phone visit for sugars, 3 month follow up in person.

## 2019-07-21 NOTE — Assessment & Plan Note (Signed)
In sinus rhythm today. Continue current regimen. Continue to monitor. Refills given today.

## 2019-07-21 NOTE — Patient Instructions (Signed)
We are recommending the vaccine to everyone who has not had an allergic reaction to any of the components of the vaccine. If you have specific questions about the vaccine, please bring them up with your health care provider to discuss them.   We will likely not be getting the vaccine in the office for the first rounds of vaccinations. The way they are releasing the vaccines is going to be through the health systems (like [facility], [facility], [facility], [facility]), through your county health department, or through the pharmacies.   The [facility] Department is giving vaccines to those 65+ and Health Care Workers Teachers and [place] providers start 05/25/19, Essential workers start 3/10 and those with co-morbidities start 06/22/19 Call [phone] to schedule  If you are 65+ you can get a vaccine through [facility] by signing up for an appointment.  You can sign up by going to: [email or web address].  You can get more information by going to: [email or web address]  [employer] next door is giving the [employer]- you can call [phone] or stop by there to schedule.

## 2019-07-21 NOTE — Assessment & Plan Note (Signed)
Under good control on current regimen. Continue current regimen. Continue to monitor. Call with any concerns. Refills given. Labs drawn today.   

## 2019-07-21 NOTE — Assessment & Plan Note (Signed)
Will keep BP, cholesterol and sugars under good control. Continue to monitor. Call with any concerns.  

## 2019-07-21 NOTE — Assessment & Plan Note (Signed)
Not under good control with A1c of 9.7- will increase her tresiba to 24 units and recheck by phone in 1 week. Adjust dose as needed.

## 2019-07-22 ENCOUNTER — Telehealth: Payer: Self-pay | Admitting: Family Medicine

## 2019-07-22 LAB — CBC WITH DIFFERENTIAL/PLATELET
Basophils Absolute: 0 10*3/uL (ref 0.0–0.2)
Basos: 1 %
EOS (ABSOLUTE): 0 10*3/uL (ref 0.0–0.4)
Eos: 1 %
Hematocrit: 43.7 % (ref 34.0–46.6)
Hemoglobin: 14.2 g/dL (ref 11.1–15.9)
Immature Grans (Abs): 0 10*3/uL (ref 0.0–0.1)
Immature Granulocytes: 1 %
Lymphocytes Absolute: 1.2 10*3/uL (ref 0.7–3.1)
Lymphs: 24 %
MCH: 28.7 pg (ref 26.6–33.0)
MCHC: 32.5 g/dL (ref 31.5–35.7)
MCV: 88 fL (ref 79–97)
Monocytes Absolute: 0.4 10*3/uL (ref 0.1–0.9)
Monocytes: 7 %
Neutrophils Absolute: 3.4 10*3/uL (ref 1.4–7.0)
Neutrophils: 66 %
Platelets: 230 10*3/uL (ref 150–450)
RBC: 4.95 x10E6/uL (ref 3.77–5.28)
RDW: 13.1 % (ref 11.7–15.4)
WBC: 5.2 10*3/uL (ref 3.4–10.8)

## 2019-07-22 LAB — COMPREHENSIVE METABOLIC PANEL
ALT: 18 IU/L (ref 0–32)
AST: 15 IU/L (ref 0–40)
Albumin/Globulin Ratio: 1.6 (ref 1.2–2.2)
Albumin: 4 g/dL (ref 3.6–4.6)
Alkaline Phosphatase: 126 IU/L — ABNORMAL HIGH (ref 39–117)
BUN/Creatinine Ratio: 15 (ref 12–28)
BUN: 13 mg/dL (ref 8–27)
Bilirubin Total: 0.5 mg/dL (ref 0.0–1.2)
CO2: 25 mmol/L (ref 20–29)
Calcium: 9.8 mg/dL (ref 8.7–10.3)
Chloride: 97 mmol/L (ref 96–106)
Creatinine, Ser: 0.85 mg/dL (ref 0.57–1.00)
GFR calc Af Amer: 73 mL/min/{1.73_m2} (ref >59)
GFR calc non Af Amer: 63 mL/min/{1.73_m2} (ref >59)
Globulin, Total: 2.5 g/dL (ref 1.5–4.5)
Glucose: 367 mg/dL — ABNORMAL HIGH (ref 65–99)
Potassium: 4.6 mmol/L (ref 3.5–5.2)
Sodium: 137 mmol/L (ref 134–144)
Total Protein: 6.5 g/dL (ref 6.0–8.5)

## 2019-07-22 LAB — LIPID PANEL W/O CHOL/HDL RATIO
Cholesterol, Total: 229 mg/dL — ABNORMAL HIGH (ref 100–199)
HDL: 51 mg/dL (ref >39)
LDL Chol Calc (NIH): 139 mg/dL — ABNORMAL HIGH (ref 0–99)
Triglycerides: 220 mg/dL — ABNORMAL HIGH (ref 0–149)
VLDL Cholesterol Cal: 39 mg/dL (ref 5–40)

## 2019-07-22 LAB — TSH: TSH: 1.71 u[IU]/mL (ref 0.450–4.500)

## 2019-07-22 NOTE — Telephone Encounter (Signed)
Routing to provider. I see you documented await culture. Are we waiting for that before antibiotic?

## 2019-07-22 NOTE — Telephone Encounter (Signed)
We are waiting on the culture. She was not complaining of a UTI yesterday.

## 2019-07-22 NOTE — Telephone Encounter (Signed)
Copied from [place] [id number]. Topic: General - Inquiry >> Jul 22, 2019 10:35 AM Mathis Bud wrote: Reason for CRM: Patient saw PCP yesterday for UTI, Patient states that no medication was called into the pharmacy. Patient is requesting something be called in today Call back [phone] [place], [address] [place]  Phone:  [phone] Fax:  [phone]

## 2019-07-22 NOTE — Telephone Encounter (Signed)
Tried calling patient, no answer and VM not set up.

## 2019-07-23 LAB — MICROSCOPIC EXAMINATION: RBC, Urine: NONE SEEN /hpf (ref 0–2)

## 2019-07-23 LAB — MICROALBUMIN, URINE WAIVED
Creatinine, Urine Waived: 100 mg/dL (ref 10–300)
Microalb, Ur Waived: 30 mg/L — ABNORMAL HIGH (ref 0–19)
Microalb/Creat Ratio: <30 mg/g (ref <30)

## 2019-07-23 LAB — URINE CULTURE, REFLEX

## 2019-07-23 LAB — UA/M W/RFLX CULTURE, ROUTINE
Bilirubin, UA: NEGATIVE
Nitrite, UA: NEGATIVE
Protein,UA: NEGATIVE
RBC, UA: NEGATIVE
Specific Gravity, UA: 1.02 (ref 1.005–1.030)
Urobilinogen, Ur: 1 mg/dL (ref 0.2–1.0)
pH, UA: 5.5 (ref 5.0–7.5)

## 2019-07-23 LAB — BAYER DCA HB A1C WAIVED: HB A1C (BAYER DCA - WAIVED): 9.7 % — ABNORMAL HIGH (ref <7.0)

## 2019-07-25 NOTE — Telephone Encounter (Signed)
Patient notified

## 2019-07-25 NOTE — Telephone Encounter (Signed)
Called patient, no answer, unable to leave a message, will try again.   

## 2019-07-25 NOTE — Telephone Encounter (Signed)
Culture was normal. No sign of UTI. Likely irritation from high sugars

## 2019-07-25 NOTE — Telephone Encounter (Signed)
Was the culture normal?

## 2019-07-29 ENCOUNTER — Ambulatory Visit: Payer: Self-pay | Admitting: Licensed Clinical Social Worker

## 2019-07-29 DIAGNOSIS — I10 Essential (primary) hypertension: Secondary | ICD-10-CM

## 2019-07-29 NOTE — Chronic Care Management (AMB) (Signed)
  Care Management   Follow Up Note   07/29/2019 Name: Daisy Watkins MRN: [id number] DOB: 1932-06-29  Referred by: Valerie Roys, DO Reason for referral : Care Coordination   [name] is a 86 y.o. year old female who is a primary care patient of Valerie Roys, DO. The care management team was consulted for assistance with care management and care coordination needs.    Review of patient status, including review of consultants reports, relevant laboratory and other test results, and collaboration with appropriate care team members and the patient's provider was performed as part of comprehensive patient evaluation and provision of chronic care management services.    LCSW completed initial CCM outreach to patient on 07/29/19. Patient reports that she is interested in relocating out of her son's house to somewhere in [facility] that she can live on her own. LCSW provided education on available housing resources within the area. Patient reports that she is interested in [place] at [place] and applying for low income housing.. Patient denies any further case management or social work needs and request further housing resources and application assistance. LCSW will complete C3 referral for further housing assistance.   The patient has been provided with contact information for the care management team and has been advised to call with any health related questions or concerns.   Eula Fried, BSW, MSW, [facility] Care Management [email or web address] Phone: [phone]

## 2019-08-09 ENCOUNTER — Telehealth: Payer: Self-pay | Admitting: Family Medicine

## 2019-08-09 NOTE — Telephone Encounter (Signed)
Copied from [place] [id number]. Topic: General - Inquiry >> Aug 09, 2019  1:39 PM Richardo Priest, [place] wrote: Reason for CRM: Patient called in stating she is having an issue with getting Insulin Degludec (TRESIBA) 100 UNIT/ML SOLN. States the pharmacy does not have it and patient does not know what to do as she is out, but does complain that the medication makes her feel unwell. Please advise and see what can be done.

## 2019-08-10 NOTE — Telephone Encounter (Signed)
Patient picked up samples

## 2019-08-10 NOTE — Telephone Encounter (Signed)
Two samples signed out for patient. Patient notified.  Copied from [place] [id number]. Topic: General - Other >> Aug 09, 2019  4:36 PM Mcneil, Ja[place] wrote: Reason for CRM: Pt stated she is completely out of medication and would like to come by to get samples. >> Aug 10, 2019  8:26 AM Don Perking M wrote: Contact pt in regards to [facility] samples for pt. Already approved by Dr Wynetta Emery yesterday.

## 2019-08-12 ENCOUNTER — Ambulatory Visit: Payer: Medicare HMO | Admitting: Family Medicine

## 2019-08-25 ENCOUNTER — Ambulatory Visit: Payer: Medicare HMO | Admitting: Family Medicine

## 2019-09-01 ENCOUNTER — Ambulatory Visit: Payer: Medicare HMO | Admitting: Family Medicine

## 2019-09-03 ENCOUNTER — Other Ambulatory Visit: Payer: Self-pay | Admitting: Family Medicine

## 2019-09-05 ENCOUNTER — Ambulatory Visit: Payer: Medicare HMO | Admitting: Family Medicine

## 2019-09-06 ENCOUNTER — Other Ambulatory Visit: Payer: Self-pay

## 2019-09-06 ENCOUNTER — Encounter: Payer: Self-pay | Admitting: Family Medicine

## 2019-09-06 ENCOUNTER — Ambulatory Visit (INDEPENDENT_AMBULATORY_CARE_PROVIDER_SITE_OTHER): Payer: Medicare HMO | Admitting: Family Medicine

## 2019-09-06 VITALS — BP 148/68 | HR 83 | Temp 98.4°F | Wt 193.0 lb

## 2019-09-06 DIAGNOSIS — E1165 Type 2 diabetes mellitus with hyperglycemia: Secondary | ICD-10-CM | POA: Diagnosis not present

## 2019-09-06 DIAGNOSIS — N898 Other specified noninflammatory disorders of vagina: Secondary | ICD-10-CM

## 2019-09-06 DIAGNOSIS — R300 Dysuria: Secondary | ICD-10-CM | POA: Diagnosis not present

## 2019-09-06 DIAGNOSIS — Z86718 Personal history of other venous thrombosis and embolism: Secondary | ICD-10-CM

## 2019-09-06 DIAGNOSIS — I480 Paroxysmal atrial fibrillation: Secondary | ICD-10-CM | POA: Diagnosis not present

## 2019-09-06 DIAGNOSIS — Z794 Long term (current) use of insulin: Secondary | ICD-10-CM | POA: Diagnosis not present

## 2019-09-06 DIAGNOSIS — I10 Essential (primary) hypertension: Secondary | ICD-10-CM | POA: Diagnosis not present

## 2019-09-06 NOTE — Progress Notes (Signed)
[name]    BP (!) 148/68 (BP Location: Left Arm, Cuff Size: Normal)   Pulse 83   Temp 98.4 F (36.9 C) (Oral)   Wt 193 lb (87.5 kg)   LMP  (LMP Unknown)   PF 98 L/min   BMI 35.30 kg/m    Subjective:    Patient ID: Daisy Watkins, female    DOB: 1932-10-22, 86 y.o.   MRN: [id number]  HPI: Daisy Watkins is a 86 y.o. female  Chief Complaint  Patient presents with  . Diabetes  . vaginal burning   DIABETES Hypoglycemic episodes:no- 90 Polydipsia/polyuria: yes Visual disturbance: no Chest pain: no Paresthesias: no Glucose Monitoring: yes  Accucheck frequency: Daily  Fasting glucose: 150-200 Taking Insulin?: yes  Long acting insulin: 20 units  Blood Pressure Monitoring: not checking Retinal Examination: Up to Date Foot Exam: Not up to Date Diabetic Education: Completed Pneumovax: Up to Date Influenza: Up to Date Aspirin: yes  HYPERTENSION- didn't take her ramipril today Hypertension status: exacerbated  Satisfied with current treatment? yes Duration of hypertension: chronic BP monitoring frequency:  not checking BP medication side effects:  no Medication compliance: good compliance Previous BP meds: ramipril Aspirin: yes Recurrent headaches: yes Visual changes: no Palpitations: no Dyspnea: no Chest pain: no Lower extremity edema: no Dizzy/lightheaded: no  VAGINAL DISCHARGE Duration: chronic Discharge description: clear  Pruritus: yes Dysuria: yes Malodorous: yes Urinary frequency: yes Fevers: no Abdominal pain: no  Sexual activity: not sexually active History of sexually transmitted diseases: no Recent antibiotic use: no Treatments attempted: none  Relevant past medical, surgical, family and social history reviewed and updated as indicated. Interim medical history since our last visit reviewed. Allergies and medications reviewed and updated.  Review of Systems  Constitutional: Negative.   Respiratory: Negative.   Cardiovascular: Negative.     Gastrointestinal: Negative.   Genitourinary: Positive for dysuria, frequency, urgency and vaginal discharge. Negative for decreased urine volume, difficulty urinating, dyspareunia, enuresis, flank pain, genital sores, hematuria, menstrual problem, pelvic pain, vaginal bleeding and vaginal pain.  Musculoskeletal: Negative.   Psychiatric/Behavioral: Negative.     Per HPI unless specifically indicated above     Objective:    BP (!) 148/68 (BP Location: Left Arm, Cuff Size: Normal)   Pulse 83   Temp 98.4 F (36.9 C) (Oral)   Wt 193 lb (87.5 kg)   LMP  (LMP Unknown)   PF 98 L/min   BMI 35.30 kg/m   Wt Readings from Last 3 Encounters:  09/06/19 193 lb (87.5 kg)  07/21/19 194 lb (88 kg)  01/21/19 201 lb (91.2 kg)    Physical Exam Vitals and nursing note reviewed.  Constitutional:      General: She is not in acute distress.    Appearance: Normal appearance. She is not ill-appearing, toxic-appearing or diaphoretic.  HENT:     Head: Normocephalic and atraumatic.     Right Ear: External ear normal.     Left Ear: External ear normal.     Nose: Nose normal.     Mouth/Throat:     Mouth: Mucous membranes are moist.     Pharynx: Oropharynx is clear.  Eyes:     General: No scleral icterus.       Right eye: No discharge.        Left eye: No discharge.     Extraocular Movements: Extraocular movements intact.     Conjunctiva/sclera: Conjunctivae normal.     Pupils: Pupils are equal, round, and reactive to light.  Cardiovascular:     Rate and Rhythm: Normal rate and regular rhythm.     Pulses: Normal pulses.     Heart sounds: Normal heart sounds. No murmur heard.  No friction rub. No gallop.   Pulmonary:     Effort: Pulmonary effort is normal. No respiratory distress.     Breath sounds: Normal breath sounds. No stridor. No wheezing, rhonchi or rales.  Chest:     Chest wall: No tenderness.  Musculoskeletal:        General: Normal range of motion.     Cervical back: Normal range  of motion and neck supple.  Skin:    General: Skin is warm and dry.     Capillary Refill: Capillary refill takes less than 2 seconds.     Coloration: Skin is not jaundiced or pale.     Findings: No bruising, erythema, lesion or rash.  Neurological:     General: No focal deficit present.     Mental Status: She is alert and oriented to person, place, and time. Mental status is at baseline.  Psychiatric:        Mood and Affect: Mood normal.        Behavior: Behavior normal.        Thought Content: Thought content normal.        Judgment: Judgment normal.     Results for orders placed or performed in visit on 09/06/19  WET PREP FOR [place], YEAST, CLUE   Specimen: Vaginal; Sterile Swab   STERILE SWAB  Result Value Ref Range   Trichomonas Exam Negative Negative   Yeast Exam Positive (A) Negative   Clue Cell Exam Positive (A) Negative  Microscopic Examination   Urine  Result Value Ref Range   WBC, UA 11-30 (A) 0 - 5 /hpf   RBC 0-2 0 - 2 /hpf   Epithelial Cells (non renal) 0-10 0 - 10 /hpf   Bacteria, UA Moderate (A) None seen/Few  Urine Culture, Reflex   Urine  Result Value Ref Range   Urine Culture, Routine Final report (A)    Organism ID, Bacteria Comment (A)   UA/M w/rflx Culture, Routine (STAT)   Specimen: Urine   Urine  Result Value Ref Range   Specific Gravity, UA 1.025 1.005 - 1.030   pH, UA 5.0 5.0 - 7.5   Color, UA Yellow Yellow   Appearance Ur Hazy (A) Clear   Leukocytes,UA Negative Negative   Protein,UA Negative Negative/Trace   Glucose, UA 3+ (A) Negative   Ketones, UA Trace (A) Negative   RBC, UA Trace (A) Negative   Bilirubin, UA Negative Negative   Urobilinogen, Ur 0.2 0.2 - 1.0 mg/dL   Nitrite, UA Negative Negative   Microscopic Examination See below:    Urinalysis Reflex Comment   CoaguChek XS/INR Waived  Result Value Ref Range   INR 1.2 (H) 0.9 - 1.1   Prothrombin Time 13.8 sec  Bayer DCA Hb A1c Waived  Result Value Ref Range   HB A1C (BAYER DCA  - WAIVED) 9.4 (H) <7.0 %  Comprehensive metabolic panel  Result Value Ref Range   Glucose 266 (H) 65 - 99 mg/dL   BUN 11 8 - 27 mg/dL   Creatinine, Ser 0.84 0.57 - 1.00 mg/dL   GFR calc non Af Amer 64 >59 mL/min/1.73   GFR calc Af Amer 74 >59 mL/min/1.73   BUN/Creatinine Ratio 13 12 - 28   Sodium 138 134 - 144 mmol/L   Potassium 4.9 3.5 -  5.2 mmol/L   Chloride 102 96 - 106 mmol/L   CO2 22 20 - 29 mmol/L   Calcium 9.8 8.7 - 10.3 mg/dL   Total Protein 6.6 6.0 - 8.5 g/dL   Albumin 4.1 3.6 - 4.6 g/dL   Globulin, Total 2.5 1.5 - 4.5 g/dL   Albumin/Globulin Ratio 1.6 1.2 - 2.2   Bilirubin Total 0.6 0.0 - 1.2 mg/dL   Alkaline Phosphatase 125 (H) 48 - 121 IU/L   AST 16 0 - 40 IU/L   ALT 18 0 - 32 IU/L      Assessment & Plan:   Problem List Items Addressed This Visit      Cardiovascular and Mediastinum   Benign essential hypertension    Did not take her ramipril today. Encouraged her to take her medicine and we'll recheck next time.       Relevant Orders   Comprehensive metabolic panel (Completed)   Paroxysmal A-fib ([place])    Tolerating her eliquis well. Continue to monitor. Call with any concerns.         Endocrine   Type 2 diabetes mellitus with hyperglycemia (HCC) - Primary    Stable with A1c of 9.4. Will continue current regimen and add trulicity. Recheck 1 month. Call with any concerns.       Relevant Orders   Bayer DCA Hb A1c Waived (Completed)   Comprehensive metabolic panel (Completed)     Other   History of DVT (deep vein thrombosis)   Relevant Orders   CoaguChek XS/INR Waived (Completed)    Other Visit Diagnoses    Dysuria       UA unclear- will await culture and treat as needed. Possibly due to her DM.    Relevant Orders   UA/M w/rflx Culture, Routine (STAT) (Completed)   Vaginal irritation       Wet prep normal.    Relevant Orders   WET PREP FOR [place], YEAST, CLUE (Completed)       Follow up plan: Return in about 4 weeks (around  10/04/2019).

## 2019-09-07 ENCOUNTER — Telehealth: Payer: Self-pay | Admitting: Family Medicine

## 2019-09-07 LAB — COMPREHENSIVE METABOLIC PANEL
ALT: 18 IU/L (ref 0–32)
AST: 16 IU/L (ref 0–40)
Albumin/Globulin Ratio: 1.6 (ref 1.2–2.2)
Albumin: 4.1 g/dL (ref 3.6–4.6)
Alkaline Phosphatase: 125 IU/L — ABNORMAL HIGH (ref 48–121)
BUN/Creatinine Ratio: 13 (ref 12–28)
BUN: 11 mg/dL (ref 8–27)
Bilirubin Total: 0.6 mg/dL (ref 0.0–1.2)
CO2: 22 mmol/L (ref 20–29)
Calcium: 9.8 mg/dL (ref 8.7–10.3)
Chloride: 102 mmol/L (ref 96–106)
Creatinine, Ser: 0.84 mg/dL (ref 0.57–1.00)
GFR calc Af Amer: 74 mL/min/{1.73_m2} (ref >59)
GFR calc non Af Amer: 64 mL/min/{1.73_m2} (ref >59)
Globulin, Total: 2.5 g/dL (ref 1.5–4.5)
Glucose: 266 mg/dL — ABNORMAL HIGH (ref 65–99)
Potassium: 4.9 mmol/L (ref 3.5–5.2)
Sodium: 138 mmol/L (ref 134–144)
Total Protein: 6.6 g/dL (ref 6.0–8.5)

## 2019-09-07 LAB — BAYER DCA HB A1C WAIVED: HB A1C (BAYER DCA - WAIVED): 9.4 % — ABNORMAL HIGH (ref <7.0)

## 2019-09-07 LAB — COAGUCHEK XS/INR WAIVED
INR: 1.2 — ABNORMAL HIGH (ref 0.9–1.1)
Prothrombin Time: 13.8 s

## 2019-09-07 LAB — WET PREP FOR TRICH, YEAST, CLUE
Clue Cell Exam: POSITIVE — AB
Trichomonas Exam: NEGATIVE
Yeast Exam: POSITIVE — AB

## 2019-09-07 NOTE — Telephone Encounter (Signed)
[place] to see if they have any availability. The receptionist stated that they do not have any availability at this time. She stated that the wait-list is 8 - 12 months long. Care Guide will give patient a call to let her know.

## 2019-09-07 NOTE — Telephone Encounter (Signed)
   SF 09/07/2019   Name: Daisy Watkins   MRN: [id number]   DOB: 1932-08-05   AGE: 86 y.o.   GENDER: female   PCP Park Liter P, DO.   Called pt regarding [employer] Referral for housing assistance. Patient stated that she is interested in income-based or subsidized housing. Ms. Burry stated she has been meaning to stop by the [facility] in [place] to see if they have any availability. Informed patient that Care Guide will give them a call to see if they have any availability. Asked patient if she has placed an application with the [employer]. Patient stated she has not. Will give patient information for that organization as well.   [place], Care Management Phone: [phone] Email: [email or web address]

## 2019-09-08 NOTE — Telephone Encounter (Signed)
° °  SF 09/08/2019    Name: Daisy Watkins    MRN: [id number]    DOB: March 13, 1933    AGE: 86 y.o.    GENDER: female    PCP Park Liter P, DO.   Called pt regarding [employer] Referral for housing. Informed patient that the wait list for the [facility] in [place] has an 8 to 12 month waiting list. Patient stated understanding and said that she still might put in an application to be put on the wait-list. Also informed patient that she can put in an application at [employer]. She will need to stop by there office to pick up an application and return it once she has completed it. Informed patient that Care Guide can send her a letter with the information for [employer] and a list of income-based housing in [place]. Patient stated that would be great so she can use the information as a a reference when she needs it. Patient stated no additional needs at this time. Printed letter and housing document for patient sent email to [employer], [employer] to mail to patient.   Closing referral pending any other needs of patient.    [place], Care Management Phone: [phone] Email: [email or web address]

## 2019-09-09 ENCOUNTER — Other Ambulatory Visit: Payer: Self-pay | Admitting: Family Medicine

## 2019-09-09 LAB — UA/M W/RFLX CULTURE, ROUTINE
Bilirubin, UA: NEGATIVE
Leukocytes,UA: NEGATIVE
Nitrite, UA: NEGATIVE
Protein,UA: NEGATIVE
Specific Gravity, UA: 1.025 (ref 1.005–1.030)
Urobilinogen, Ur: 0.2 mg/dL (ref 0.2–1.0)
pH, UA: 5 (ref 5.0–7.5)

## 2019-09-09 LAB — MICROSCOPIC EXAMINATION

## 2019-09-09 LAB — URINE CULTURE, REFLEX

## 2019-09-09 MED ORDER — NITROFURANTOIN MONOHYD MACRO 100 MG PO CAPS: 100.0000 mg | ORAL_CAPSULE | Freq: Two times a day (BID) | ORAL | 0 refills | Status: DC

## 2019-09-14 ENCOUNTER — Encounter: Payer: Self-pay | Admitting: Family Medicine

## 2019-09-14 NOTE — Assessment & Plan Note (Signed)
Tolerating her eliquis well. Continue to monitor. Call with any concerns.

## 2019-09-14 NOTE — Assessment & Plan Note (Signed)
Stable with A1c of 9.4. Will continue current regimen and add trulicity. Recheck 1 month. Call with any concerns.

## 2019-09-14 NOTE — Assessment & Plan Note (Signed)
Did not take her ramipril today. Encouraged her to take her medicine and we'll recheck next time.

## 2019-09-27 ENCOUNTER — Other Ambulatory Visit: Payer: Self-pay | Admitting: Family Medicine

## 2019-09-27 DIAGNOSIS — I10 Essential (primary) hypertension: Secondary | ICD-10-CM

## 2019-09-27 NOTE — Telephone Encounter (Signed)
Requested Prescriptions  Pending Prescriptions Disp Refills   ramipril (ALTACE) 10 MG capsule [Pharmacy Med Name: RAMIPRIL 10MG  CAPSULES] 90 capsule 1    Sig: TAKE 1 CAPSULE(10 MG TOTAL) BY MOUTH DAILY.     Cardiovascular:  ACE Inhibitors Failed - 09/27/2019 11:55 AM      Failed - Last BP in normal range    BP Readings from Last 1 Encounters:  09/06/19 (!) 148/68         Passed - Cr in normal range and within 180 days    Creatinine  Date Value Ref Range Status  01/27/2014 0.66 0.60 - 1.30 mg/dL Final   Creatinine, Ser  Date Value Ref Range Status  09/06/2019 0.84 0.57 - 1.00 mg/dL Final         Passed - K in normal range and within 180 days    Potassium  Date Value Ref Range Status  09/06/2019 4.9 3.5 - 5.2 mmol/L Final  01/27/2014 4.1 3.5 - 5.1 mmol/L Final         Passed - Patient is not pregnant      Passed - Valid encounter within last 6 months    Recent Outpatient Visits          3 weeks ago Type 2 diabetes mellitus with hyperglycemia, with long-term current use of insulin ([facility] [place] [facility])   Dresser, Megan P, DO   2 months ago Aortic atherosclerosis ([facility])   [place], Megan P, DO   4 months ago [place] P, DO   5 months ago Paroxysmal A-fib [facility])   [place] P, DO   7 months ago Paroxysmal A-fib [facility])   [place], Solon P, DO      Future Appointments            In 1 week Wynetta Emery, Barb Merino, DO [employer]

## 2019-10-06 ENCOUNTER — Ambulatory Visit: Payer: Medicare HMO | Admitting: Family Medicine

## 2019-10-11 ENCOUNTER — Encounter: Payer: Self-pay | Admitting: Family Medicine

## 2019-10-11 ENCOUNTER — Other Ambulatory Visit: Payer: Self-pay

## 2019-10-11 ENCOUNTER — Ambulatory Visit (INDEPENDENT_AMBULATORY_CARE_PROVIDER_SITE_OTHER): Payer: Medicare HMO | Admitting: Family Medicine

## 2019-10-11 VITALS — BP 146/62 | HR 86 | Temp 98.6°F | Wt 190.6 lb

## 2019-10-11 DIAGNOSIS — F5101 Primary insomnia: Secondary | ICD-10-CM | POA: Diagnosis not present

## 2019-10-11 DIAGNOSIS — Z794 Long term (current) use of insulin: Secondary | ICD-10-CM | POA: Diagnosis not present

## 2019-10-11 DIAGNOSIS — E1165 Type 2 diabetes mellitus with hyperglycemia: Secondary | ICD-10-CM | POA: Diagnosis not present

## 2019-10-11 MED ORDER — LORAZEPAM 1 MG PO TABS: 1.0000 mg | ORAL_TABLET | Freq: Every evening | ORAL | 2 refills | Status: DC | PRN

## 2019-10-11 MED ORDER — TRULICITY 0.75 MG/0.5ML ~~LOC~~ SOAJ
0.7500 mg | SUBCUTANEOUS | 2 refills | Status: DC
Start: 2019-10-11 — End: 2019-11-21

## 2019-10-11 NOTE — Progress Notes (Signed)
BP (!) 146/62 (BP Location: Left Arm, Cuff Size: Normal)   Pulse 86   Temp 98.6 F (37 C) (Oral)   Wt 190 lb 9.6 oz (86.5 kg)   LMP  (LMP Unknown)   SpO2 98%   BMI 34.86 kg/m    Subjective:    Patient ID: Daisy Watkins, female    DOB: 04/28/1932, 86 y.o.   MRN: [id number]  HPI: Daisy Watkins is a 86 y.o. female  Chief Complaint  Patient presents with  . Diabetes  . Medication Problem    tresiba   DIABETES- has been feeling funny. Appetite hasn't been what it has. Has been quite tired. Did not start her trulicity. Hypoglycemic episodes:no Polydipsia/polyuria: yes Visual disturbance: no Chest pain: no Paresthesias: no Glucose Monitoring: yes  Accucheck frequency: Daily  Fasting glucose: 150s Taking Insulin?: yes  Long acting insulin:  Short acting insulin: Blood Pressure Monitoring: not checking Retinal Examination: Up to Date Foot Exam: Done today Diabetic Education: Completed Pneumovax: Up to Date Influenza: Up to Date Aspirin: yes   INSOMNIA Duration: chronic Satisfied with sleep quality: yes Difficulty falling asleep: no Difficulty staying asleep: no Waking a few hours after sleep onset: no Early morning awakenings: no Daytime hypersomnolence: no Wakes feeling refreshed: no Good sleep hygiene: no Apnea: no Snoring: no Depressed/anxious mood: yes Recent stress: yes Restless legs/nocturnal leg cramps: no Chronic pain/arthritis: no History of sleep study: no Treatments attempted: melatonin, uinsom, benadryl and ambien    Relevant past medical, surgical, family and social history reviewed and updated as indicated. Interim medical history since our last visit reviewed. Allergies and medications reviewed and updated.  Review of Systems  Constitutional: Positive for fatigue. Negative for activity change, appetite change, chills, diaphoresis, fever and unexpected weight change.  HENT: Negative.   Respiratory: Negative.   Cardiovascular: Negative.     Gastrointestinal: Negative.   Musculoskeletal: Negative.   Psychiatric/Behavioral: Negative.     Per HPI unless specifically indicated above     Objective:    BP (!) 146/62 (BP Location: Left Arm, Cuff Size: Normal)   Pulse 86   Temp 98.6 F (37 C) (Oral)   Wt 190 lb 9.6 oz (86.5 kg)   LMP  (LMP Unknown)   SpO2 98%   BMI 34.86 kg/m   Wt Readings from Last 3 Encounters:  10/11/19 190 lb 9.6 oz (86.5 kg)  09/06/19 193 lb (87.5 kg)  07/21/19 194 lb (88 kg)    Physical Exam Vitals and nursing note reviewed.  Constitutional:      General: She is not in acute distress.    Appearance: Normal appearance. She is not ill-appearing, toxic-appearing or diaphoretic.  HENT:     Head: Normocephalic and atraumatic.     Right Ear: External ear normal.     Left Ear: External ear normal.     Nose: Nose normal.     Mouth/Throat:     Mouth: Mucous membranes are moist.     Pharynx: Oropharynx is clear.  Eyes:     General: No scleral icterus.       Right eye: No discharge.        Left eye: No discharge.     Extraocular Movements: Extraocular movements intact.     Conjunctiva/sclera: Conjunctivae normal.     Pupils: Pupils are equal, round, and reactive to light.  Cardiovascular:     Rate and Rhythm: Normal rate and regular rhythm.     Pulses: Normal pulses.  Heart sounds: Normal heart sounds. No murmur heard.  No friction rub. No gallop.   Pulmonary:     Effort: Pulmonary effort is normal. No respiratory distress.     Breath sounds: Normal breath sounds. No stridor. No wheezing, rhonchi or rales.  Chest:     Chest wall: No tenderness.  Musculoskeletal:        General: Normal range of motion.     Cervical back: Normal range of motion and neck supple.  Skin:    General: Skin is warm and dry.     Capillary Refill: Capillary refill takes less than 2 seconds.     Coloration: Skin is not jaundiced or pale.     Findings: No bruising, erythema, lesion or rash.  Neurological:      General: No focal deficit present.     Mental Status: She is alert and oriented to person, place, and time. Mental status is at baseline.  Psychiatric:        Mood and Affect: Mood normal.        Behavior: Behavior normal.        Thought Content: Thought content normal.        Judgment: Judgment normal.     Results for orders placed or performed in visit on 09/06/19  WET PREP FOR [place], YEAST, CLUE   Specimen: Vaginal; Sterile Swab   STERILE SWAB  Result Value Ref Range   Trichomonas Exam Negative Negative   Yeast Exam Positive (A) Negative   Clue Cell Exam Positive (A) Negative  Microscopic Examination   Urine  Result Value Ref Range   WBC, UA 11-30 (A) 0 - 5 /hpf   RBC 0-2 0 - 2 /hpf   Epithelial Cells (non renal) 0-10 0 - 10 /hpf   Bacteria, UA Moderate (A) None seen/Few  Urine Culture, Reflex   Urine  Result Value Ref Range   Urine Culture, Routine Final report (A)    Organism ID, Bacteria Comment (A)   UA/M w/rflx Culture, Routine (STAT)   Specimen: Urine   Urine  Result Value Ref Range   Specific Gravity, UA 1.025 1.005 - 1.030   pH, UA 5.0 5.0 - 7.5   Color, UA Yellow Yellow   Appearance Ur Hazy (A) Clear   Leukocytes,UA Negative Negative   Protein,UA Negative Negative/Trace   Glucose, UA 3+ (A) Negative   Ketones, UA Trace (A) Negative   RBC, UA Trace (A) Negative   Bilirubin, UA Negative Negative   Urobilinogen, Ur 0.2 0.2 - 1.0 mg/dL   Nitrite, UA Negative Negative   Microscopic Examination See below:    Urinalysis Reflex Comment   CoaguChek XS/INR Waived  Result Value Ref Range   INR 1.2 (H) 0.9 - 1.1   Prothrombin Time 13.8 sec  Bayer DCA Hb A1c Waived  Result Value Ref Range   HB A1C (BAYER DCA - WAIVED) 9.4 (H) <7.0 %  Comprehensive metabolic panel  Result Value Ref Range   Glucose 266 (H) 65 - 99 mg/dL   BUN 11 8 - 27 mg/dL   Creatinine, Ser 0.84 0.57 - 1.00 mg/dL   GFR calc non Af Amer 64 >59 mL/min/1.73   GFR calc Af Amer 74 >59  mL/min/1.73   BUN/Creatinine Ratio 13 12 - 28   Sodium 138 134 - 144 mmol/L   Potassium 4.9 3.5 - 5.2 mmol/L   Chloride 102 96 - 106 mmol/L   CO2 22 20 - 29 mmol/L   Calcium 9.8 8.7 -  10.3 mg/dL   Total Protein 6.6 6.0 - 8.5 g/dL   Albumin 4.1 3.6 - 4.6 g/dL   Globulin, Total 2.5 1.5 - 4.5 g/dL   Albumin/Globulin Ratio 1.6 1.2 - 2.2   Bilirubin Total 0.6 0.0 - 1.2 mg/dL   Alkaline Phosphatase 125 (H) 48 - 121 IU/L   AST 16 0 - 40 IU/L   ALT 18 0 - 32 IU/L      Assessment & Plan:   Problem List Items Addressed This Visit      Endocrine   Type 2 diabetes mellitus with hyperglycemia ([facility])    Did not start her trulicity. Rx given today in paper. Continue tresiba. Follow up 1 month. No labs today.      Relevant Medications   Dulaglutide (TRULICITY) 1.61 WR/6.0AV SOPN     Other   Insomnia - Primary    Under good control on current regimen. Continue current regimen. Continue to monitor. Call with any concerns. Refills given for 3 months. Follow up on insomnia in 3 months.             Follow up plan: Return in about 4 weeks (around 11/08/2019).

## 2019-10-11 NOTE — Assessment & Plan Note (Signed)
Under good control on current regimen. Continue current regimen. Continue to monitor. Call with any concerns. Refills given for 3 months. Follow up on insomnia in 3 months.

## 2019-10-11 NOTE — Assessment & Plan Note (Signed)
Did not start her trulicity. Rx given today in paper. Continue tresiba. Follow up 1 month. No labs today.

## 2019-10-18 ENCOUNTER — Other Ambulatory Visit: Payer: Self-pay | Admitting: Family Medicine

## 2019-10-28 ENCOUNTER — Telehealth: Payer: Self-pay | Admitting: Family Medicine

## 2019-10-28 NOTE — Telephone Encounter (Signed)
Pt called to speak with Dr. Wynetta Emery to make sure she received and is suppose to be taking the insulin she has/ Pt is not sure if she has the correct insulin and just wants to check with dr. Wynetta Emery please advise

## 2019-10-28 NOTE — Telephone Encounter (Signed)
Called and clarified medications with patient based off last OV note.

## 2019-11-07 ENCOUNTER — Ambulatory Visit (INDEPENDENT_AMBULATORY_CARE_PROVIDER_SITE_OTHER): Payer: Medicare HMO

## 2019-11-07 VITALS — Ht 62.0 in | Wt 192.0 lb

## 2019-11-07 DIAGNOSIS — Z0000 Encounter for general adult medical examination without abnormal findings: Secondary | ICD-10-CM | POA: Diagnosis not present

## 2019-11-07 NOTE — Progress Notes (Signed)
I connected with Daisy Watkins today by telephone and verified that I am speaking with the correct person using two identifiers. Location patient: home Location provider: work Persons participating in the virtual visit: Daisy Watkins, Daisy Durand LPN.   I discussed the limitations, risks, security and privacy concerns of performing an evaluation and management service by telephone and the availability of in person appointments. I also discussed with the patient that there may be a patient responsible charge related to this service. The patient expressed understanding and verbally consented to this telephonic visit.    Interactive audio and video telecommunications were attempted between this provider and patient, however failed, due to patient having technical difficulties OR patient did not have access to video capability.  We continued and completed visit with audio only.  Vital signs may be patient reported or missing.    Subjective:   Daisy Watkins is a 86 y.o. female who presents for Medicare Annual (Subsequent) preventive examination.  Review of Systems     Cardiac Risk Factors include: advanced age (>26men, >79 women);diabetes mellitus;obesity (BMI >30kg/m2);sedentary lifestyle     Objective:    Today's Vitals   11/07/19 1426  Weight: 192 lb (87.1 kg)  Height: 5\' 2"  (1.575 m)   Body mass index is 35.12 kg/m.  Advanced Directives 11/07/2019 10/12/2017 11/05/2016 10/24/2016 09/05/2016  Does Patient Have a Medical Advance Directive? Yes No No Yes No  Type of Advance Directive Out of facility DNR (pink MOST or yellow form) - - Press photographer -  Does patient want to make changes to medical advance directive? - - No - Patient declined No - Patient declined -  Copy of [place] in Chart? - - No - copy requested No - copy requested -  Would patient like information on creating a medical advance directive? - Yes (MAU/Ambulatory/Procedural Areas - Information  given) - - Yes (MAU/Ambulatory/Procedural Areas - Information given)    Current Medications (verified) Outpatient Encounter Medications as of 11/07/2019  Medication Sig  . acetaminophen (TYLENOL) 500 MG tablet Take 500 mg every 6 (six) hours as needed by mouth. As needed  . amLODipine (NORVASC) 10 MG tablet Take 1 tablet (10 mg total) by mouth daily.  Marland Kitchen apixaban (ELIQUIS) 5 MG TABS tablet Take 1 tablet (5 mg total) by mouth 2 (two) times daily.  Marland Kitchen aspirin EC 81 MG tablet Take 81 mg by mouth daily.  Marland Kitchen azelastine (OPTIVAR) 0.05 % ophthalmic solution Place 1 drop into both eyes 2 (two) times daily.  . Cholecalciferol (VITAMIN D3) 1000 units CAPS Take by mouth. Daily  . Dulaglutide (TRULICITY) 1.91 YN/8.2NF SOPN Inject 0.5 mLs (0.75 mg total) into the skin once a week.  . erythromycin ophthalmic ointment Place 1 application into both eyes 2 (two) times daily as needed.  . fluticasone (FLONASE) 50 MCG/ACT nasal spray Place 2 sprays into both nostrils daily.  . Insulin Degludec (TRESIBA) 100 UNIT/ML SOLN Inject 24 Units into the skin at bedtime.  Marland Kitchen LORazepam (ATIVAN) 1 MG tablet Take 1 tablet (1 mg total) by mouth at bedtime as needed.  . magnesium oxide (MAG-OX) 400 MG tablet Take by mouth daily.   . ramipril (ALTACE) 10 MG capsule TAKE 1 CAPSULE(10 MG TOTAL) BY MOUTH DAILY.  . ramipril (ALTACE) 5 MG capsule TAKE ONE CAPSULE BY MOUTH DAILY WITH 10 MG CAPSULE FOR A TOTAL OF 15 MG DAILY  . sertraline (ZOLOFT) 25 MG tablet Take 1 tablet (25 mg total) by mouth daily.  Marland Kitchen  TRUETRACK TEST test strip   . vitamin C (ASCORBIC ACID) 500 MG tablet Take 500 mg by mouth daily.  . meloxicam (MOBIC) 15 MG tablet Take 1 tablet (15 mg total) by mouth daily. (Patient not taking: Reported on 07/21/2019)  . omeprazole (PRILOSEC) 40 MG capsule Take 1 capsule (40 mg total) by mouth daily. (Patient not taking: Reported on 09/06/2019)   No facility-administered encounter medications on file as of 11/07/2019.    Allergies  (verified) Ciprofloxacin, Clindamycin/lincomycin, Hctz [hydrochlorothiazide], Sulfur, and Doxycycline hyclate   History: Past Medical History:  Diagnosis Date  . Anxiety   . Constipation 08/17/2015  . Diabetes mellitus without complication ([place])   . DVT (deep venous thrombosis) ([place]) 2006   chronic anticoagulation  . Encounter for monitoring Coumadin therapy 12/16/2016  . GERD (gastroesophageal reflux disease)   . Hyperlipidemia   . Hypertension   . Insomnia   . Monitoring for anticoagulant use 09/21/2014   Goal INR 2-3; recurrent DVTs, paroxysmal atrial fibrillation.  Unable to take Xaralto  . Myalgia 02/23/2017  . Sebaceous cyst 06/11/2015  . Skin lesion of back 09/05/2016  . Trapezius muscle spasm 05/22/2017  . Urinary frequency 11/12/2016  . Vitamin B12 deficiency   . Vitamin D deficiency disease    Past Surgical History:  Procedure Laterality Date  . ABDOMINAL HYSTERECTOMY     Family History  Problem Relation Age of Onset  . Diabetes Maternal Grandmother   . Hypertension Mother   . Cancer Daughter        breast  . Heart disease Son   . Heart disease Son    Social History   Socioeconomic History  . Marital status: Divorced    Spouse name: Not on file  . Number of children: Not on file  . Years of education: Not on file  . Highest education level: GED or equivalent  Occupational History  . Not on file  Tobacco Use  . Smoking status: Former Smoker    Packs/day: 0.50    Years: 30.00    Pack years: 15.00    Types: Cigarettes    Quit date: 03/31/1977    Years since quitting: 42.6  . Smokeless tobacco: Never Used  Vaping Use  . Vaping Use: Never used  Substance and Sexual Activity  . Alcohol use: No    Alcohol/week: 0.0 standard drinks    Comment: rare  . Drug use: No  . Sexual activity: Not Currently  Other Topics Concern  . Not on file  Social History Narrative  . Not on file   Social Determinants of Health   Financial Resource Strain: Medium Risk  .  Difficulty of Paying Living Expenses: Somewhat hard  Food Insecurity: No Food Insecurity  . Worried About Charity fundraiser in the Last Year: Never true  . Ran Out of Food in the Last Year: Never true  Transportation Needs: No Transportation Needs  . Lack of Transportation (Medical): No  . Lack of Transportation (Non-Medical): No  Physical Activity: Insufficiently Active  . Days of Exercise per Week: 2 days  . Minutes of Exercise per Session: 20 min  Stress: Stress Concern Present  . Feeling of Stress : To some extent  Social Connections:   . Frequency of Communication with Friends and Family:   . Frequency of Social Gatherings with Friends and Family:   . Attends Religious Services:   . Active Member of Clubs or Organizations:   . Attends Archivist Meetings:   Marland Kitchen Marital  Status:     Tobacco Counseling Counseling given: Not Answered   Clinical Intake:  Pre-visit preparation completed: Yes  Pain : No/denies pain     Nutritional Status: BMI > 30  Obese Diabetes: Yes  How often do you need to have someone help you when you read instructions, pamphlets, or other written materials from your doctor or pharmacy?: 1 - Never What is the last grade level you completed in school?: GED  Diabetic? Yes Nutrition Risk Assessment:  Has the patient had any N/V/D within the last 2 months?  No  Does the patient have any non-healing wounds?  No  Has the patient had any unintentional weight loss or weight gain?  No   Diabetes:  Is the patient diabetic?  Yes  If diabetic, was a CBG obtained today?  No  Did the patient bring in their glucometer from home?  No  How often do you monitor your CBG's? Every other day.   Financial Strains and Diabetes Management:  Are you having any financial strains with the device, your supplies or your medication? No .  Does the patient want to be seen by Chronic Care Management for management of their diabetes?  No  Would the patient like  to be referred to a Nutritionist or for Diabetic Management?  No   Diabetic Exams:  Diabetic Eye Exam: Overdue for diabetic eye exam. Pt has been advised about the importance in completing this exam. Patient advised to call and schedule an eye exam. Diabetic Foot Exam: Completed 10/11/2019   Interpreter Needed?: No  Information entered by :: NAllen LPN   Activities of Daily Living In your present state of health, do you have any difficulty performing the following activities: 11/07/2019  Hearing? N  Vision? Y  Comment sometimes  Difficulty concentrating or making decisions? N  Walking or climbing stairs? N  Dressing or bathing? N  Doing errands, shopping? N  Preparing Food and eating ? N  Using the Toilet? N  In the past six months, have you accidently leaked urine? Y  Comment held too long  Do you have problems with loss of bowel control? N  Managing your Medications? N  Managing your Finances? N  Housekeeping or managing your Housekeeping? N  Some recent data might be hidden    Patient Care Team: Valerie Roys, DO as PCP - General (Family Medicine) Christene Lye, MD (General Surgery) Corey Skains, MD as Consulting Physician (Cardiology)  Indicate any recent Medical Services you may have received from other than [facility] providers in the past year (date may be approximate).     Assessment:   This is a routine wellness examination for Dodi.  Hearing/Vision screen  Hearing Screening   125Hz  250Hz  500Hz  1000Hz  2000Hz  3000Hz  4000Hz  6000Hz  8000Hz   Right ear:           Left ear:           Vision Screening Comments: Regular eye exams, Dr. Ellin Mayhew  Dietary issues and exercise activities discussed: Current Exercise Habits: Home exercise routine, Type of exercise: walking, Time (Minutes): 15, Frequency (Times/Week): 2, Weekly Exercise (Minutes/Week): 30  Goals    . DIET - INCREASE WATER INTAKE     Recommend drinking at least 6-8 glasses of water a day     .  Increase water intake     Recommend drinking at least 3-4 glasses of water a day     . Patient Stated     11/07/2019, a little  bit of everything      Depression Screen PHQ 2/9 Scores 11/07/2019 04/25/2019 10/21/2018 07/21/2018 06/25/2018 01/20/2018 10/26/2017  PHQ - 2 Score 3 6 0 1 0 0 0  PHQ- 9 Score 3 15 7 5  - 15 8    Fall Risk Fall Risk  11/07/2019 10/21/2018 07/21/2018 06/25/2018 10/26/2017  Falls in the past year? 0 0 0 0 No  Number falls in past yr: - - 0 0 -  Injury with Fall? - - 0 0 -  Risk for fall due to : Medication side effect - - - -  Follow up Falls evaluation completed;Education provided;Falls prevention discussed - - - -    Any stairs in or around the home? Yes  If so, are there any without handrails? Yes  Home free of loose throw rugs in walkways, pet beds, electrical cords, etc? Yes  Adequate lighting in your home to reduce risk of falls? Yes   ASSISTIVE DEVICES UTILIZED TO PREVENT FALLS:  Life alert? No  Use of a cane, walker or w/c? No  Grab bars in the bathroom? No  Shower chair or bench in shower? No  Elevated toilet seat or a handicapped toilet? No   TIMED UP AND GO:  Was the test performed? No .  Cognitive Function:     6CIT Screen 11/07/2019 10/21/2018 10/12/2017 09/05/2016  What Year? 0 points 0 points 0 points 0 points  What month? 0 points 3 points 0 points 0 points  What time? 0 points 0 points 0 points 0 points  Count back from 20 2 points 2 points 0 points 0 points  Months in reverse 4 points 0 points 0 points 0 points  Repeat phrase 2 points 6 points 2 points 0 points  Total Score 8 11 2  0    Immunizations Immunization History  Administered Date(s) Administered  . Fluad Quad(high Dose 65+) 12/10/2018  . Influenza, High Dose Seasonal PF 12/25/2015, 01/23/2017, 12/15/2017  . Influenza,inj,Quad PF,6+ Mos 03/21/2015  . Influenza-Unspecified 12/22/2013  . Pneumococcal Conjugate-13 10/21/2018    TDAP status: Due, Education has been provided  regarding the importance of this vaccine. Advised may receive this vaccine at local pharmacy or Health Dept. Aware to provide a copy of the vaccination record if obtained from local pharmacy or Health Dept. Verbalized acceptance and understanding. Flu Vaccine status: Up to date Pneumococcal vaccine status: needs PPSV23 Covid-19 vaccine status: Information provided on how to obtain vaccines.   Qualifies for Shingles Vaccine? Yes   Zostavax completed No   Shingrix Completed?: Yes  Screening Tests Health Maintenance  Topic Date Due  . COVID-19 Vaccine (1) Never done  . OPHTHALMOLOGY EXAM  09/05/2017  . PNA vac Low Risk Adult (2 of 2 - PPSV23) 10/21/2019  . INFLUENZA VACCINE  10/30/2019  . TETANUS/TDAP  09/05/2020 (Originally 09/15/1951)  . HEMOGLOBIN A1C  03/07/2020  . FOOT EXAM  10/10/2020  . DEXA SCAN  Completed    Health Maintenance  Health Maintenance Due  Topic Date Due  . COVID-19 Vaccine (1) Never done  . OPHTHALMOLOGY EXAM  09/05/2017  . PNA vac Low Risk Adult (2 of 2 - PPSV23) 10/21/2019  . INFLUENZA VACCINE  10/30/2019    Colorectal cancer screening: No longer required.  Mammogram status: No longer required.  Bone Density status: Completed 03/31/2010.   Lung Cancer Screening: (Low Dose CT Chest recommended if Age 23-80 years, 30 pack-year currently smoking OR have quit w/in 15years.) does not qualify.   Lung Cancer Screening  Referral: no   Additional Screening:  Hepatitis C Screening: does not qualify;   Vision Screening: Recommended annual ophthalmology exams for early detection of glaucoma and other disorders of the eye. Is the patient up to date with their annual eye exam?  Yes  Who is the provider or what is the name of the office in which the patient attends annual eye exams? Dr. Ellin Mayhew If pt is not established with a provider, would they like to be referred to a provider to establish care? No .   Dental Screening: Recommended annual dental exams for proper  oral hygiene  Community Resource Referral / Chronic Care Management: CRR required this visit?  No   CCM required this visit?  No      Plan:     I have personally reviewed and noted the following in the patient's chart:   . Medical and social history . Use of alcohol, tobacco or illicit drugs  . Current medications and supplements . Functional ability and status . Nutritional status . Physical activity . Advanced directives . List of other physicians . Hospitalizations, surgeries, and ER visits in previous 12 months . Vitals . Screenings to include cognitive, depression, and falls . Referrals and appointments  In addition, I have reviewed and discussed with patient certain preventive protocols, quality metrics, and best practice recommendations. A written personalized care plan for preventive services as well as general preventive health recommendations were provided to patient.   Due to this being a telephonic visit, the after visit summary with patients personalized plan was offered to patient via mail or my-chart. Patient preferred to pick up at office at next visit.   Kellie Simmering, LPN   09/30/1826   Nurse Notes: Did not get covid vaccine because sugar was up. Plans on getting it.

## 2019-11-07 NOTE — Patient Instructions (Signed)
Daisy Watkins , Thank you for taking time to come for your Medicare Wellness Visit. I appreciate your ongoing commitment to your health goals. Please review the following plan we discussed and let me know if I can assist you in the future.   Screening recommendations/referrals: Colonoscopy: not required Mammogram: not required Bone Density: completed 03/31/2010 Recommended yearly ophthalmology/optometry visit for glaucoma screening and checkup Recommended yearly dental visit for hygiene and checkup  Vaccinations: Influenza vaccine: due Pneumococcal vaccine: due Tdap vaccine: decline Shingles vaccine: discussed   Covid-19:plans to get  Advanced directives: copy in chart  Conditions/risks identified: none  Next appointment: Follow up in one year for your annual wellness visit    Preventive Care 19 Years and Older, Female Preventive care refers to lifestyle choices and visits with your health care provider that can promote health and wellness. What does preventive care include?  A yearly physical exam. This is also called an annual well check.  Dental exams once or twice a year.  Routine eye exams. Ask your health care provider how often you should have your eyes checked.  Personal lifestyle choices, including:  Daily care of your teeth and gums.  Regular physical activity.  Eating a healthy diet.  Avoiding tobacco and drug use.  Limiting alcohol use.  Practicing safe sex.  Taking low-dose aspirin every day.  Taking vitamin and mineral supplements as recommended by your health care provider. What happens during an annual well check? The services and screenings done by your health care provider during your annual well check will depend on your age, overall health, lifestyle risk factors, and family history of disease. Counseling  Your health care provider may ask you questions about your:  Alcohol use.  Tobacco use.  Drug use.  Emotional well-being.  Home and  relationship well-being.  Sexual activity.  Eating habits.  History of falls.  Memory and ability to understand (cognition).  Work and work Statistician.  Reproductive health. Screening  You may have the following tests or measurements:  Height, weight, and BMI.  Blood pressure.  Lipid and cholesterol levels. These may be checked every 5 years, or more frequently if you are over 12 years old.  Skin check.  Lung cancer screening. You may have this screening every year starting at age 12 if you have a 30-pack-year history of smoking and currently smoke or have quit within the past 15 years.  Fecal occult blood test (FOBT) of the stool. You may have this test every year starting at age 60.  Flexible sigmoidoscopy or colonoscopy. You may have a sigmoidoscopy every 5 years or a colonoscopy every 10 years starting at age 57.  Hepatitis C blood test.  Hepatitis B blood test.  Sexually transmitted disease (STD) testing.  Diabetes screening. This is done by checking your blood sugar (glucose) after you have not eaten for a while (fasting). You may have this done every 1-3 years.  Bone density scan. This is done to screen for osteoporosis. You may have this done starting at age 63.  Mammogram. This may be done every 1-2 years. Talk to your health care provider about how often you should have regular mammograms. Talk with your health care provider about your test results, treatment options, and if necessary, the need for more tests. Vaccines  Your health care provider may recommend certain vaccines, such as:  Influenza vaccine. This is recommended every year.  Tetanus, diphtheria, and acellular pertussis (Tdap, Td) vaccine. You may need a Td booster every 10  years.  Zoster vaccine. You may need this after age 60.  Pneumococcal 13-valent conjugate (PCV13) vaccine. One dose is recommended after age 57.  Pneumococcal polysaccharide (PPSV23) vaccine. One dose is recommended after  age 52. Talk to your health care provider about which screenings and vaccines you need and how often you need them. This information is not intended to replace advice given to you by your health care provider. Make sure you discuss any questions you have with your health care provider. Document Released: 04/13/2015 Document Revised: 12/05/2015 Document Reviewed: 01/16/2015 Elsevier Interactive Patient Education  2017 [place] Prevention in the Home Falls can cause injuries. They can happen to people of all ages. There are many things you can do to make your home safe and to help prevent falls. What can I do on the outside of my home?  Regularly fix the edges of walkways and driveways and fix any cracks.  Remove anything that might make you trip as you walk through a door, such as a raised step or threshold.  Trim any bushes or trees on the path to your home.  Use bright outdoor lighting.  Clear any walking paths of anything that might make someone trip, such as rocks or tools.  Regularly check to see if handrails are loose or broken. Make sure that both sides of any steps have handrails.  Any raised decks and porches should have guardrails on the edges.  Have any leaves, snow, or ice cleared regularly.  Use sand or salt on walking paths during winter.  Clean up any spills in your garage right away. This includes oil or grease spills. What can I do in the bathroom?  Use night lights.  Install grab bars by the toilet and in the tub and shower. Do not use towel bars as grab bars.  Use non-skid mats or decals in the tub or shower.  If you need to sit down in the shower, use a plastic, non-slip stool.  Keep the floor dry. Clean up any water that spills on the floor as soon as it happens.  Remove soap buildup in the tub or shower regularly.  Attach bath mats securely with double-sided non-slip rug tape.  Do not have throw rugs and other things on the floor that can  make you trip. What can I do in the bedroom?  Use night lights.  Make sure that you have a light by your bed that is easy to reach.  Do not use any sheets or blankets that are too big for your bed. They should not hang down onto the floor.  Have a firm chair that has side arms. You can use this for support while you get dressed.  Do not have throw rugs and other things on the floor that can make you trip. What can I do in the kitchen?  Clean up any spills right away.  Avoid walking on wet floors.  Keep items that you use a lot in easy-to-reach places.  If you need to reach something above you, use a strong step stool that has a grab bar.  Keep electrical cords out of the way.  Do not use floor polish or wax that makes floors slippery. If you must use wax, use non-skid floor wax.  Do not have throw rugs and other things on the floor that can make you trip. What can I do with my stairs?  Do not leave any items on the stairs.  Make sure that  there are handrails on both sides of the stairs and use them. Fix handrails that are broken or loose. Make sure that handrails are as long as the stairways.  Check any carpeting to make sure that it is firmly attached to the stairs. Fix any carpet that is loose or worn.  Avoid having throw rugs at the top or bottom of the stairs. If you do have throw rugs, attach them to the floor with carpet tape.  Make sure that you have a light switch at the top of the stairs and the bottom of the stairs. If you do not have them, ask someone to add them for you. What else can I do to help prevent falls?  Wear shoes that:  Do not have high heels.  Have rubber bottoms.  Are comfortable and fit you well.  Are closed at the toe. Do not wear sandals.  If you use a stepladder:  Make sure that it is fully opened. Do not climb a closed stepladder.  Make sure that both sides of the stepladder are locked into place.  Ask someone to hold it for you,  if possible.  Clearly mark and make sure that you can see:  Any grab bars or handrails.  First and last steps.  Where the edge of each step is.  Use tools that help you move around (mobility aids) if they are needed. These include:  Canes.  Walkers.  Scooters.  Crutches.  Turn on the lights when you go into a dark area. Replace any light bulbs as soon as they burn out.  Set up your furniture so you have a clear path. Avoid moving your furniture around.  If any of your floors are uneven, fix them.  If there are any pets around you, be aware of where they are.  Review your medicines with your doctor. Some medicines can make you feel dizzy. This can increase your chance of falling. Ask your doctor what other things that you can do to help prevent falls. This information is not intended to replace advice given to you by your health care provider. Make sure you discuss any questions you have with your health care provider. Document Released: 01/11/2009 Document Revised: 08/23/2015 Document Reviewed: 04/21/2014 Elsevier Interactive Patient Education  2017 [employer].

## 2019-11-11 ENCOUNTER — Ambulatory Visit: Payer: Medicare HMO | Admitting: Family Medicine

## 2019-11-21 ENCOUNTER — Other Ambulatory Visit: Payer: Self-pay

## 2019-11-21 ENCOUNTER — Ambulatory Visit (INDEPENDENT_AMBULATORY_CARE_PROVIDER_SITE_OTHER): Payer: Medicare HMO | Admitting: Family Medicine

## 2019-11-21 ENCOUNTER — Encounter: Payer: Self-pay | Admitting: Family Medicine

## 2019-11-21 VITALS — BP 169/78 | HR 79 | Temp 98.2°F | Wt 190.8 lb

## 2019-11-21 DIAGNOSIS — B373 Candidiasis of vulva and vagina: Secondary | ICD-10-CM

## 2019-11-21 DIAGNOSIS — R8281 Pyuria: Secondary | ICD-10-CM | POA: Diagnosis not present

## 2019-11-21 DIAGNOSIS — B3731 Acute candidiasis of vulva and vagina: Secondary | ICD-10-CM

## 2019-11-21 DIAGNOSIS — N898 Other specified noninflammatory disorders of vagina: Secondary | ICD-10-CM

## 2019-11-21 DIAGNOSIS — Z794 Long term (current) use of insulin: Secondary | ICD-10-CM | POA: Diagnosis not present

## 2019-11-21 DIAGNOSIS — E1165 Type 2 diabetes mellitus with hyperglycemia: Secondary | ICD-10-CM | POA: Diagnosis not present

## 2019-11-21 DIAGNOSIS — R8271 Bacteriuria: Secondary | ICD-10-CM | POA: Diagnosis not present

## 2019-11-21 LAB — WET PREP FOR TRICH, YEAST, CLUE
Clue Cell Exam: NEGATIVE
Trichomonas Exam: NEGATIVE
Yeast Exam: POSITIVE — AB

## 2019-11-21 MED ORDER — FLUCONAZOLE 150 MG PO TABS: 150.0000 mg | ORAL_TABLET | Freq: Once | ORAL | 1 refills | Status: AC

## 2019-11-21 MED ORDER — TRULICITY 0.75 MG/0.5ML ~~LOC~~ SOAJ: 0.7500 mg | SUBCUTANEOUS | 2 refills | Status: DC

## 2019-11-21 NOTE — Progress Notes (Signed)
BP (!) 169/78 (BP Location: Left Arm, Patient Position: Sitting, Cuff Size: Normal)   Pulse 79   Temp 98.2 F (36.8 C) (Oral)   Wt 190 lb 12.8 oz (86.5 kg)   LMP  (LMP Unknown)   SpO2 98%   BMI 34.90 kg/m    Subjective:    Patient ID: Daisy Watkins, female    DOB: 06/14/1932, 86 y.o.   MRN: [id number]  HPI: Daisy Watkins is a 86 y.o. female  Chief Complaint  Patient presents with  . Diabetes  . Vaginal Itching  . Vaginal Pain   Lost her son since the last time she was here. She's dealing with it OK, but is in the grieving process  VAGINAL DISCHARGE Duration: weeks Discharge description: cottage cheese  Pruritus: yes Dysuria: yes Malodorous: yes Urinary frequency: yes Fevers: no Abdominal pain: yes  Sexual activity: not sexually active History of sexually transmitted diseases: no Recent antibiotic use: no Context: recurrent yeast infections  Treatments attempted: none  DIABETES- has not been able to pick up her trulicity from the insurance. Not sure what's going on Hypoglycemic episodes:no Polydipsia/polyuria: yes Visual disturbance: no Chest pain: no Paresthesias: no Glucose Monitoring: yes  Accucheck frequency: every couple of days  Fasting glucose: 135 Taking Insulin?: yes  Long acting insulin: 24 units Blood Pressure Monitoring: not checking Retinal Examination: Up to Date Foot Exam: Up to Date Diabetic Education: Completed Pneumovax: Not up to Date Influenza: not in yet Aspirin: no   Relevant past medical, surgical, family and social history reviewed and updated as indicated. Interim medical history since our last visit reviewed. Allergies and medications reviewed and updated.  Review of Systems  Constitutional: Negative.   Respiratory: Negative.   Cardiovascular: Negative.   Gastrointestinal: Negative.   Genitourinary: Positive for dysuria, frequency, urgency and vaginal discharge. Negative for decreased urine volume, difficulty urinating,  dyspareunia, enuresis, flank pain, genital sores, hematuria, menstrual problem, pelvic pain, vaginal bleeding and vaginal pain.  Musculoskeletal: Negative.   Neurological: Negative.   Psychiatric/Behavioral: Negative.     Per HPI unless specifically indicated above     Objective:    BP (!) 169/78 (BP Location: Left Arm, Patient Position: Sitting, Cuff Size: Normal)   Pulse 79   Temp 98.2 F (36.8 C) (Oral)   Wt 190 lb 12.8 oz (86.5 kg)   LMP  (LMP Unknown)   SpO2 98%   BMI 34.90 kg/m   Wt Readings from Last 3 Encounters:  11/21/19 190 lb 12.8 oz (86.5 kg)  11/07/19 192 lb (87.1 kg)  10/11/19 190 lb 9.6 oz (86.5 kg)    Physical Exam Vitals and nursing note reviewed.  Constitutional:      General: She is not in acute distress.    Appearance: Normal appearance. She is not ill-appearing, toxic-appearing or diaphoretic.  HENT:     Head: Normocephalic and atraumatic.     Right Ear: External ear normal.     Left Ear: External ear normal.     Nose: Nose normal.     Mouth/Throat:     Mouth: Mucous membranes are moist.     Pharynx: Oropharynx is clear.  Eyes:     General: No scleral icterus.       Right eye: No discharge.        Left eye: No discharge.     Extraocular Movements: Extraocular movements intact.     Conjunctiva/sclera: Conjunctivae normal.     Pupils: Pupils are equal, round, and reactive to  light.  Cardiovascular:     Rate and Rhythm: Normal rate and regular rhythm.     Pulses: Normal pulses.     Heart sounds: Normal heart sounds. No murmur heard.  No friction rub. No gallop.   Pulmonary:     Effort: Pulmonary effort is normal. No respiratory distress.     Breath sounds: Normal breath sounds. No stridor. No wheezing, rhonchi or rales.  Chest:     Chest wall: No tenderness.  Musculoskeletal:        General: Normal range of motion.     Cervical back: Normal range of motion and neck supple.  Skin:    General: Skin is warm and dry.     Capillary Refill:  Capillary refill takes less than 2 seconds.     Coloration: Skin is not jaundiced or pale.     Findings: No bruising, erythema, lesion or rash.  Neurological:     General: No focal deficit present.     Mental Status: She is alert and oriented to person, place, and time. Mental status is at baseline.  Psychiatric:        Mood and Affect: Mood normal.        Behavior: Behavior normal.        Thought Content: Thought content normal.        Judgment: Judgment normal.     Results for orders placed or performed in visit on 09/06/19  WET PREP FOR [place], YEAST, CLUE   Specimen: Vaginal; Sterile Swab   STERILE SWAB  Result Value Ref Range   Trichomonas Exam Negative Negative   Yeast Exam Positive (A) Negative   Clue Cell Exam Positive (A) Negative  Microscopic Examination   Urine  Result Value Ref Range   WBC, UA 11-30 (A) 0 - 5 /hpf   RBC 0-2 0 - 2 /hpf   Epithelial Cells (non renal) 0-10 0 - 10 /hpf   Bacteria, UA Moderate (A) None seen/Few  Urine Culture, Reflex   Urine  Result Value Ref Range   Urine Culture, Routine Final report (A)    Organism ID, Bacteria Comment (A)   UA/M w/rflx Culture, Routine (STAT)   Specimen: Urine   Urine  Result Value Ref Range   Specific Gravity, UA 1.025 1.005 - 1.030   pH, UA 5.0 5.0 - 7.5   Color, UA Yellow Yellow   Appearance Ur Hazy (A) Clear   Leukocytes,UA Negative Negative   Protein,UA Negative Negative/Trace   Glucose, UA 3+ (A) Negative   Ketones, UA Trace (A) Negative   RBC, UA Trace (A) Negative   Bilirubin, UA Negative Negative   Urobilinogen, Ur 0.2 0.2 - 1.0 mg/dL   Nitrite, UA Negative Negative   Microscopic Examination See below:    Urinalysis Reflex Comment   CoaguChek XS/INR Waived  Result Value Ref Range   INR 1.2 (H) 0.9 - 1.1   Prothrombin Time 13.8 sec  Bayer DCA Hb A1c Waived  Result Value Ref Range   HB A1C (BAYER DCA - WAIVED) 9.4 (H) <7.0 %  Comprehensive metabolic panel  Result Value Ref Range   Glucose  266 (H) 65 - 99 mg/dL   BUN 11 8 - 27 mg/dL   Creatinine, Ser 0.84 0.57 - 1.00 mg/dL   GFR calc non Af Amer 64 >59 mL/min/1.73   GFR calc Af Amer 74 >59 mL/min/1.73   BUN/Creatinine Ratio 13 12 - 28   Sodium 138 134 - 144 mmol/L   Potassium  4.9 3.5 - 5.2 mmol/L   Chloride 102 96 - 106 mmol/L   CO2 22 20 - 29 mmol/L   Calcium 9.8 8.7 - 10.3 mg/dL   Total Protein 6.6 6.0 - 8.5 g/dL   Albumin 4.1 3.6 - 4.6 g/dL   Globulin, Total 2.5 1.5 - 4.5 g/dL   Albumin/Globulin Ratio 1.6 1.2 - 2.2   Bilirubin Total 0.6 0.0 - 1.2 mg/dL   Alkaline Phosphatase 125 (H) 48 - 121 IU/L   AST 16 0 - 40 IU/L   ALT 18 0 - 32 IU/L      Assessment & Plan:   Problem List Items Addressed This Visit      Endocrine   Type 2 diabetes mellitus with hyperglycemia ([place]) - Primary    Has not been able to get her trulicity. Additional sample given today and confirmed with pharmacy that it is ready for her to pick up. Start trulicity. Continue tresiba. Recheck 1 month. Call with any concerns.       Relevant Medications   Dulaglutide (TRULICITY) 9.48 AX/6.5VV SOPN    Other Visit Diagnoses    Vaginal irritation       + yeast   Relevant Orders   Wet Prep for Trich, Yeast, Clue (STAT)   UA/M w/rflx Culture, Routine (STAT)   Yeast vaginitis       Will treat with diflucan. Call with any concerns. Continue to monitor.    Relevant Medications   fluconazole (DIFLUCAN) 150 MG tablet       Follow up plan: Return in about 4 weeks (around 12/19/2019).

## 2019-11-21 NOTE — Assessment & Plan Note (Signed)
Has not been able to get her trulicity. Additional sample given today and confirmed with pharmacy that it is ready for her to pick up. Start trulicity. Continue tresiba. Recheck 1 month. Call with any concerns.

## 2019-11-25 LAB — UA/M W/RFLX CULTURE, ROUTINE
Bilirubin, UA: NEGATIVE
Ketones, UA: NEGATIVE
Nitrite, UA: NEGATIVE
Protein,UA: NEGATIVE
Specific Gravity, UA: 1.02 (ref 1.005–1.030)
Urobilinogen, Ur: 1 mg/dL (ref 0.2–1.0)
pH, UA: 5 (ref 5.0–7.5)

## 2019-11-25 LAB — URINE CULTURE, REFLEX

## 2019-11-25 LAB — MICROSCOPIC EXAMINATION

## 2019-11-28 ENCOUNTER — Other Ambulatory Visit: Payer: Self-pay | Admitting: Family Medicine

## 2019-11-28 ENCOUNTER — Telehealth: Payer: Self-pay | Admitting: Family Medicine

## 2019-11-28 MED ORDER — NITROFURANTOIN MONOHYD MACRO 100 MG PO CAPS: 100.0000 mg | ORAL_CAPSULE | Freq: Two times a day (BID) | ORAL | 0 refills | Status: DC

## 2019-11-28 NOTE — Telephone Encounter (Signed)
Copied from [place] [phone]. Topic: General - Inquiry >> Nov 28, 2019 11:44 AM Greggory Keen D wrote: Reason for CRM: Pt called saying she came in on the 23rd and seen Dr. Wynetta Emery and she has not heard back from her labs from that day.  She said she is still hurting when she urinates and itching in her vaginal area  CB#  [phone]

## 2019-11-28 NOTE — Telephone Encounter (Signed)
Result note just sent to [place] on lunch. Tried calling patient back, see further documentation in result note.

## 2019-12-15 ENCOUNTER — Telehealth: Payer: Self-pay

## 2019-12-15 NOTE — Telephone Encounter (Signed)
Copied from [place] [phone]. Topic: General - Other >> Dec 15, 2019 12:24 PM Hinda Lenis D wrote: PT having side effects, asking to speak with a medical team   / Dulaglutide (TRULICITY) 9.44 CQ/1.9UV [place] [facility] [[id number]]   Called and spoke to patient. She states that for some reason she was given Novolin most recently by the pharmacy. Patient has not been on Novolin since last Fall according to the chart. Is supposed to be taking [place] and Trulicity according to chart.  Called pharmacy. Novolin was dispensed to the patient recently. I asked when the RX was written and pharmacy stated 11/2018. Tyler Aas is still on file as well for the patient to get.   Routing to provider for clarification on what she is supposed to be taking. Patient states she has been taking the Novolin and it has been giving her nightmares. Patient also states she needs samples of the Trulicity.

## 2019-12-15 NOTE — Telephone Encounter (Signed)
She should NOT be taking novolin. Just trulicity and tresiba

## 2019-12-16 ENCOUNTER — Telehealth: Payer: Self-pay | Admitting: Family Medicine

## 2019-12-16 MED ORDER — TRESIBA 100 UNIT/ML ~~LOC~~ SOLN: 24.0000 [IU] | Freq: Every day | SUBCUTANEOUS | 3 refills | Status: DC

## 2019-12-16 NOTE — Telephone Encounter (Signed)
Tried calling patient, no answer and no VM set up. Will try to call again later.

## 2019-12-16 NOTE — Telephone Encounter (Signed)
  Notes to clinic No signature of frequency of use therefore would not allow me to approve it.

## 2019-12-16 NOTE — Telephone Encounter (Signed)
Pt called in to request a Rx for Insulin Degludec (TRESIBA) 100 UNIT/ML SOLN. Pt says that she wasn't aware that she ran out. Pt says that she has an apt on Monday with PCP for follow up. Pt would like to know if she could have a supply until her apt to prevent her from running out?      pharmacy:  [id number] - [place], [place]  Please assist

## 2019-12-16 NOTE — Telephone Encounter (Signed)
Patient notified about medications. 2 samples of Trulicity also signed out for the patient to pick up this afternoon.

## 2019-12-19 ENCOUNTER — Ambulatory Visit: Payer: Medicare HMO | Admitting: Family Medicine

## 2019-12-23 ENCOUNTER — Ambulatory Visit (INDEPENDENT_AMBULATORY_CARE_PROVIDER_SITE_OTHER): Payer: Medicare HMO | Admitting: Family Medicine

## 2019-12-23 ENCOUNTER — Encounter: Payer: Self-pay | Admitting: Family Medicine

## 2019-12-23 ENCOUNTER — Other Ambulatory Visit: Payer: Self-pay

## 2019-12-23 VITALS — BP 138/68 | HR 69 | Temp 98.1°F | Wt 189.0 lb

## 2019-12-23 DIAGNOSIS — Z23 Encounter for immunization: Secondary | ICD-10-CM

## 2019-12-23 DIAGNOSIS — F322 Major depressive disorder, single episode, severe without psychotic features: Secondary | ICD-10-CM

## 2019-12-23 DIAGNOSIS — F411 Generalized anxiety disorder: Secondary | ICD-10-CM

## 2019-12-23 DIAGNOSIS — E1165 Type 2 diabetes mellitus with hyperglycemia: Secondary | ICD-10-CM

## 2019-12-23 DIAGNOSIS — Z794 Long term (current) use of insulin: Secondary | ICD-10-CM | POA: Diagnosis not present

## 2019-12-23 MED ORDER — SERTRALINE HCL 50 MG PO TABS
50.0000 mg | ORAL_TABLET | Freq: Every day | ORAL | 1 refills | Status: DC
Start: 2019-12-23 — End: 2020-06-09

## 2019-12-23 MED ORDER — LORAZEPAM 1 MG PO TABS
1.0000 mg | ORAL_TABLET | Freq: Every evening | ORAL | 2 refills | Status: DC | PRN
Start: 2020-01-09 — End: 2020-03-21

## 2019-12-23 NOTE — Assessment & Plan Note (Signed)
Better at 9.1, but still high. Continue trulicity at 8.33 and recheck 3 months, likely will need to increase. Refills given.

## 2019-12-23 NOTE — Assessment & Plan Note (Signed)
Will increase her sertraline to 50mg  and recheck 3 months. Refills on her lorazepam given for 3 months. Follow up 3 months.

## 2019-12-23 NOTE — Progress Notes (Signed)
BP 138/68 (BP Location: Left Arm, Cuff Size: Normal)   Pulse 69   Temp 98.1 F (36.7 C) (Oral)   Wt 189 lb (85.7 kg)   LMP  (LMP Unknown)   SpO2 98%   BMI 34.57 kg/m    Subjective:    Patient ID: Daisy Watkins, female    DOB: 1932-08-12, 86 y.o.   MRN: [id number]  HPI: Daisy Watkins is a 86 y.o. female  Chief Complaint  Patient presents with  . Diabetes   DIABETES Hypoglycemic episodes:no Polydipsia/polyuria: no Visual disturbance: no Chest pain: no Paresthesias: no Glucose Monitoring: yes  Accucheck frequency: Daily  Fasting glucose: 135 Taking Insulin?: yes Blood Pressure Monitoring: not checking Retinal Examination: Not up to Date Foot Exam: Up to Date Diabetic Education: Completed Pneumovax: Up to Date Influenza: Up to Date Aspirin: yes  ANXIETY/STRESS Duration: chronic Status:stable Anxious mood: yes  Excessive worrying: no Irritability: yes  Sweating: no Nausea: no Palpitations:no Hyperventilation: no Panic attacks: no Agoraphobia: no  Obscessions/compulsions: no Depressed mood: yes Depression screen [facility] 2/9 12/23/2019 11/07/2019 04/25/2019 10/21/2018 07/21/2018  Decreased Interest - 0 3 0 0  Down, Depressed, Hopeless - 3 3 0 1  PHQ - 2 Score - 3 6 0 1  Altered sleeping 0 0 3 3 3   Tired, decreased energy 1 0 3 1 0  Change in appetite 0 0 3 3 1   Feeling bad or failure about yourself  1 0 0 0 0  Trouble concentrating 0 0 0 0 0  Moving slowly or fidgety/restless 0 0 0 0 0  Suicidal thoughts 0 0 0 0 0  PHQ-9 Score - 3 15 7 5   Difficult doing work/chores - Not difficult at all Very difficult - Not difficult at all  Some recent data might be hidden   GAD 7 : Generalized Anxiety Score 12/23/2019 10/21/2018 07/21/2018 10/26/2017  Nervous, Anxious, on Edge 3 3 3  0  Control/stop worrying 1 0 0 1  Worry too much - different things 2 2 1 1   Trouble relaxing 1 1 0 1  Restless - 0 0 0  Easily annoyed or irritable 1 3 0 0  Afraid - awful might happen 2 1 3  0    Total GAD 7 Score - 10 7 3   Anxiety Difficulty - Not difficult at all Not difficult at all Somewhat difficult   Anhedonia: no Weight changes: no Insomnia: yes hard to fall asleep  Hypersomnia: no Fatigue/loss of energy: yes Feelings of worthlessness: no Feelings of guilt: no Impaired concentration/indecisiveness: no Suicidal ideations: no  Crying spells: no Recent Stressors/Life Changes: yes   Relationship problems: no   Family stress: no     Financial stress: no    Job stress: no    Recent death/loss: yes   Relevant past medical, surgical, family and social history reviewed and updated as indicated. Interim medical history since our last visit reviewed. Allergies and medications reviewed and updated.  Review of Systems  Constitutional: Negative.   Respiratory: Negative.   Cardiovascular: Negative.   Gastrointestinal: Negative.   Musculoskeletal: Negative.   Neurological: Negative.   Psychiatric/Behavioral: Negative.     Per HPI unless specifically indicated above     Objective:    BP 138/68 (BP Location: Left Arm, Cuff Size: Normal)   Pulse 69   Temp 98.1 F (36.7 C) (Oral)   Wt 189 lb (85.7 kg)   LMP  (LMP Unknown)   SpO2 98%   BMI 34.57 kg/m  Wt Readings from Last 3 Encounters:  12/23/19 189 lb (85.7 kg)  11/21/19 190 lb 12.8 oz (86.5 kg)  11/07/19 192 lb (87.1 kg)    Physical Exam Vitals and nursing note reviewed.  Constitutional:      General: She is not in acute distress.    Appearance: Normal appearance. She is not ill-appearing, toxic-appearing or diaphoretic.  HENT:     Head: Normocephalic and atraumatic.     Right Ear: External ear normal.     Left Ear: External ear normal.     Nose: Nose normal.     Mouth/Throat:     Mouth: Mucous membranes are moist.     Pharynx: Oropharynx is clear.  Eyes:     General: No scleral icterus.       Right eye: No discharge.        Left eye: No discharge.     Extraocular Movements: Extraocular movements  intact.     Conjunctiva/sclera: Conjunctivae normal.     Pupils: Pupils are equal, round, and reactive to light.  Cardiovascular:     Rate and Rhythm: Normal rate and regular rhythm.     Pulses: Normal pulses.     Heart sounds: Normal heart sounds. No murmur heard.  No friction rub. No gallop.   Pulmonary:     Effort: Pulmonary effort is normal. No respiratory distress.     Breath sounds: Normal breath sounds. No stridor. No wheezing, rhonchi or rales.  Chest:     Chest wall: No tenderness.  Musculoskeletal:        General: Normal range of motion.     Cervical back: Normal range of motion and neck supple.  Skin:    General: Skin is warm and dry.     Capillary Refill: Capillary refill takes less than 2 seconds.     Coloration: Skin is not jaundiced or pale.     Findings: No bruising, erythema, lesion or rash.  Neurological:     General: No focal deficit present.     Mental Status: She is alert and oriented to person, place, and time. Mental status is at baseline.  Psychiatric:        Mood and Affect: Mood normal.        Behavior: Behavior normal.        Thought Content: Thought content normal.        Judgment: Judgment normal.     Results for orders placed or performed in visit on 12/23/19  Bayer DCA Hb A1c Waived  Result Value Ref Range   HB A1C (BAYER DCA - WAIVED) 9.1 (H) <7.0 %      Assessment & Plan:   Problem List Items Addressed This Visit      Endocrine   Type 2 diabetes mellitus with hyperglycemia ([place]) - Primary    Better at 9.1, but still high. Continue trulicity at 9.17 and recheck 3 months, likely will need to increase. Refills given.       Relevant Orders   Bayer DCA Hb A1c Waived (Completed)     Other   Anxiety, generalized    Will increase her sertraline to 50mg  and recheck 3 months. Refills on her lorazepam given for 3 months. Follow up 3 months.       Relevant Medications   sertraline (ZOLOFT) 50 MG tablet   LORazepam (ATIVAN) 1 MG tablet  (Start on 01/09/2020)   Major depression    Will increase her sertraline to 50mg  and recheck 3 months. Refills  on her lorazepam given for 3 months. Follow up 3 months.       Relevant Medications   sertraline (ZOLOFT) 50 MG tablet   LORazepam (ATIVAN) 1 MG tablet (Start on 01/09/2020)    Other Visit Diagnoses    Flu vaccine need       Flu shot given today.    Relevant Orders   Flu Vaccine QUAD High Dose(Fluad) (Completed)   Need for pneumococcal vaccine       Pneumonia shot given today.   Relevant Orders   Pneumococcal polysaccharide vaccine 23-valent greater than or equal to 2yo subcutaneous/IM (Completed)       Follow up plan: Return in about 3 months (around 03/23/2020).

## 2019-12-24 ENCOUNTER — Encounter: Payer: Self-pay | Admitting: Family Medicine

## 2019-12-24 LAB — BAYER DCA HB A1C WAIVED: HB A1C (BAYER DCA - WAIVED): 9.1 % — ABNORMAL HIGH (ref <7.0)

## 2020-01-10 ENCOUNTER — Other Ambulatory Visit: Payer: Self-pay | Admitting: Family Medicine

## 2020-01-10 NOTE — Telephone Encounter (Signed)
Patient came by the office earlier and said that the pharmacy still did not the prescription. [facility] They had [facility] RX there ready for the patient to pick up already. Nothing further needed, please refuse.

## 2020-01-10 NOTE — Telephone Encounter (Signed)
Please refuse. Should have RX at the pharmacy with a fill date of yesterday.

## 2020-01-10 NOTE — Telephone Encounter (Signed)
Requested medication (s) are due for refill today:    Requested medication (s) are on the active medication list:    Future visit scheduled:  Yes  Last Refill: 01/09/20- rx. Did not go through  Notes to clinic:  In clicking on blue hyperlink of Rx number [id number], it has not electronically transmitted.  The last Rx that Pharmacy has confirmed receipt of was from 12/23/19.  Please review.   Requested Prescriptions  Pending Prescriptions Disp Refills   LORazepam (ATIVAN) 1 MG tablet [Pharmacy Med Name: LORAZEPAM 1MG  TABLETS] 30 tablet     Sig: TAKE 1 TABLET(1 MG) BY MOUTH AT BEDTIME AS NEEDED      Not Delegated - Psychiatry:  Anxiolytics/Hypnotics Failed - 01/10/2020  3:43 PM      Failed - This refill cannot be delegated      Failed - Urine Drug Screen completed in last 360 days.      Passed - Valid encounter within last 6 months    Recent Outpatient Visits           2 weeks ago Type 2 diabetes mellitus with hyperglycemia, with long-term current use of insulin ([place])   [place], Megan P, DO   1 month ago Type 2 diabetes mellitus with hyperglycemia, with long-term current use of insulin ([place])   [place], Megan P, DO   3 months ago Primary insomnia   [place], Megan P, DO   4 months ago Type 2 diabetes mellitus with hyperglycemia, with long-term current use of insulin ([place])   [place], Megan P, DO   5 months ago Aortic atherosclerosis [facility])   [place], Megan P, DO       Future Appointments             In 2 months Johnson, Barb Merino, DO [place], [place]   In 58 months  [employer] [employer], [facility]

## 2020-01-10 NOTE — Telephone Encounter (Signed)
Requested medication (s) are due for refill today: yes   Requested medication (s) are on the active medication list: yes   Last refill:  start: 01/09/20 end: 02/08/20 #30 2 refills   Future visit scheduled: yes in 2 months   Notes to clinic:  not delegated per protocol     Requested Prescriptions  Pending Prescriptions Disp Refills   LORazepam (ATIVAN) 1 MG tablet [Pharmacy Med Name: LORAZEPAM 1MG  TABLETS] 30 tablet     Sig: TAKE 1 TABLET(1 MG) BY MOUTH AT BEDTIME AS NEEDED      Not Delegated - Psychiatry:  Anxiolytics/Hypnotics Failed - 01/10/2020 12:32 PM      Failed - This refill cannot be delegated      Failed - Urine Drug Screen completed in last 360 days.      Passed - Valid encounter within last 6 months    Recent Outpatient Visits           2 weeks ago Type 2 diabetes mellitus with hyperglycemia, with long-term current use of insulin ([place])   [place], Megan P, DO   1 month ago Type 2 diabetes mellitus with hyperglycemia, with long-term current use of insulin ([place])   [place], Megan P, DO   3 months ago Primary insomnia   [place], Megan P, DO   4 months ago Type 2 diabetes mellitus with hyperglycemia, with long-term current use of insulin ([place])   [place], Megan P, DO   5 months ago Aortic atherosclerosis [facility])   [place], Megan P, DO       Future Appointments             In 2 months Johnson, Barb Merino, DO [place], [place]   In 69 months  [employer], [facility]

## 2020-01-10 NOTE — Telephone Encounter (Signed)
Computer says it was confirmed by pharmacy on 9/24- can we check with pharmacy and if they don't have it, I'll resend or we can call it in.

## 2020-01-27 ENCOUNTER — Telehealth: Payer: Self-pay | Admitting: Family Medicine

## 2020-01-27 ENCOUNTER — Telehealth: Payer: Self-pay

## 2020-01-27 NOTE — Telephone Encounter (Signed)
Pt states she is out of her insulin Daisy Watkins and has no refills at the pharmacy has been out of medication since 01/13/2020 pt stated pharmacy stated she did not have a refills on her insulin

## 2020-01-31 ENCOUNTER — Other Ambulatory Visit: Payer: Self-pay | Admitting: Family Medicine

## 2020-01-31 MED ORDER — TRESIBA 100 UNIT/ML ~~LOC~~ SOLN: 24.0000 [IU] | Freq: Every day | SUBCUTANEOUS | 3 refills | Status: DC

## 2020-01-31 NOTE — Telephone Encounter (Signed)
Requested Prescriptions  Pending Prescriptions Disp Refills  . Insulin Degludec (TRESIBA) 100 UNIT/ML SOLN 10 mL 3    Sig: Inject 24 Units into the skin at bedtime.     Endocrinology:  Diabetes - Insulins Failed - 01/31/2020 10:39 AM      Failed - HBA1C is between 0 and 7.9 and within 180 days    Hemoglobin A1C  Date Value Ref Range Status  11/23/2015 8.4%  Final   HB A1C (BAYER DCA - WAIVED)  Date Value Ref Range Status  12/23/2019 9.1 (H) <7.0 % Final    Comment:                                          Diabetic Adult            <7.0                                       Healthy Adult        4.3 - 5.7                                                           (DCCT/NGSP) American Diabetes Association's Summary of Glycemic Recommendations for Adults with Diabetes: Hemoglobin A1c <7.0%. More stringent glycemic goals (A1c <6.0%) may further reduce complications at the cost of increased risk of hypoglycemia.          Passed - Valid encounter within last 6 months    Recent Outpatient Visits          1 month ago Type 2 diabetes mellitus with hyperglycemia, with long-term current use of insulin ([place])   [place], Megan P, DO   2 months ago Type 2 diabetes mellitus with hyperglycemia, with long-term current use of insulin ([place])   [place], Megan P, DO   3 months ago Primary insomnia   [place], Megan P, DO   4 months ago Type 2 diabetes mellitus with hyperglycemia, with long-term current use of insulin ([place])   [place], Megan P, DO   6 months ago Aortic atherosclerosis [facility])   [place], Megan P, DO      Future Appointments            In 1 month Johnson, Barb Merino, DO [place]   In 9 months  [employer] [facility]

## 2020-01-31 NOTE — Telephone Encounter (Signed)
Called pt she stated she  will pick up medication from pharmacy tomorrow.   KP

## 2020-01-31 NOTE — Telephone Encounter (Signed)
PT need a refill / she out of her medications  Insulin Degludec (TRESIBA) 100 UNIT/ML SOLN [[id number]]  [id number] [place], [place] - [address]  [address] [address]  Phone: [phone] Fax: [phone]

## 2020-02-15 ENCOUNTER — Other Ambulatory Visit: Payer: Self-pay

## 2020-02-15 MED ORDER — APIXABAN 5 MG PO TABS: 5.0000 mg | ORAL_TABLET | Freq: Two times a day (BID) | ORAL | 1 refills | Status: DC

## 2020-02-15 NOTE — Telephone Encounter (Signed)
Pt scheduled 12/23 for f/u

## 2020-02-15 NOTE — Telephone Encounter (Signed)
Received refill request for Eliquis 5mg  BID. Last OV 12/23/19, no future appointment. Will pend order for only 30 days OV needed.

## 2020-02-16 ENCOUNTER — Other Ambulatory Visit: Payer: Self-pay

## 2020-02-16 NOTE — Telephone Encounter (Signed)
Routing to provider for refill

## 2020-02-16 NOTE — Telephone Encounter (Signed)
PT Stated she would like her  Dulaglutide (TRULICITY) 0.35 WS/5.6CL SOPN sent into  [id number] [place], [place] - [address] (Ph: [phone]) pt asked requested phone call once called in as she no longer uses [facility]. Pt stated she has taken last dose and her next would be on Saturday.

## 2020-02-19 MED ORDER — TRULICITY 0.75 MG/0.5ML ~~LOC~~ SOAJ
0.7500 mg | SUBCUTANEOUS | 2 refills | Status: DC
Start: 2020-02-19 — End: 2020-03-21

## 2020-03-06 ENCOUNTER — Telehealth: Payer: Self-pay | Admitting: Family Medicine

## 2020-03-09 ENCOUNTER — Other Ambulatory Visit: Payer: Self-pay | Admitting: Family Medicine

## 2020-03-09 DIAGNOSIS — I10 Essential (primary) hypertension: Secondary | ICD-10-CM

## 2020-03-09 MED ORDER — RAMIPRIL 10 MG PO CAPS: ORAL_CAPSULE | ORAL | 1 refills | Status: DC

## 2020-03-09 MED ORDER — RAMIPRIL 5 MG PO CAPS: ORAL_CAPSULE | ORAL | 1 refills | Status: DC

## 2020-03-09 NOTE — Telephone Encounter (Signed)
Called and spoke with pharmacy, they have a refill and will get it ready for patient.  Does the patient need the upcoming appt?

## 2020-03-09 NOTE — Telephone Encounter (Signed)
Pt called and is requesting to have this refilled. She states that she is taking this currently. Please advise .

## 2020-03-09 NOTE — Telephone Encounter (Signed)
no

## 2020-03-09 NOTE — Telephone Encounter (Signed)
Patient needs a refill on ramipril [facility]

## 2020-03-09 NOTE — Telephone Encounter (Signed)
3 months given 10/11. Unclear why this was d/c'd. She is not due until January.

## 2020-03-09 NOTE — Telephone Encounter (Signed)
Refusal shows d/c in July Called pt scheduled for Monday with cheryl

## 2020-03-12 ENCOUNTER — Ambulatory Visit: Payer: Medicare HMO | Admitting: Unknown Physician Specialty

## 2020-03-21 ENCOUNTER — Ambulatory Visit (INDEPENDENT_AMBULATORY_CARE_PROVIDER_SITE_OTHER): Payer: Medicare HMO | Admitting: Family Medicine

## 2020-03-21 ENCOUNTER — Encounter: Payer: Self-pay | Admitting: Family Medicine

## 2020-03-21 ENCOUNTER — Other Ambulatory Visit: Payer: Self-pay

## 2020-03-21 VITALS — BP 128/86 | HR 87 | Temp 98.3°F | Wt 189.2 lb

## 2020-03-21 DIAGNOSIS — F411 Generalized anxiety disorder: Secondary | ICD-10-CM | POA: Diagnosis not present

## 2020-03-21 DIAGNOSIS — E1165 Type 2 diabetes mellitus with hyperglycemia: Secondary | ICD-10-CM

## 2020-03-21 DIAGNOSIS — N898 Other specified noninflammatory disorders of vagina: Secondary | ICD-10-CM | POA: Diagnosis not present

## 2020-03-21 DIAGNOSIS — Z794 Long term (current) use of insulin: Secondary | ICD-10-CM | POA: Diagnosis not present

## 2020-03-21 DIAGNOSIS — E782 Mixed hyperlipidemia: Secondary | ICD-10-CM | POA: Diagnosis not present

## 2020-03-21 DIAGNOSIS — I10 Essential (primary) hypertension: Secondary | ICD-10-CM

## 2020-03-21 DIAGNOSIS — F5101 Primary insomnia: Secondary | ICD-10-CM

## 2020-03-21 MED ORDER — NYSTATIN 100000 UNIT/GM EX POWD: 1.0000 "application " | Freq: Three times a day (TID) | CUTANEOUS | 0 refills | Status: DC

## 2020-03-21 MED ORDER — AMLODIPINE BESYLATE 10 MG PO TABS: 10.0000 mg | ORAL_TABLET | Freq: Every day | ORAL | 1 refills | Status: DC

## 2020-03-21 MED ORDER — LORAZEPAM 1 MG PO TABS: 1.0000 mg | ORAL_TABLET | Freq: Every evening | ORAL | 2 refills | Status: AC | PRN

## 2020-03-21 MED ORDER — TRULICITY 1.5 MG/0.5ML ~~LOC~~ SOAJ: 1.5000 mg | SUBCUTANEOUS | 3 refills | Status: DC

## 2020-03-21 NOTE — Progress Notes (Signed)
BP 128/86 (BP Location: Left Arm, Patient Position: Sitting, Cuff Size: Normal)   Pulse 87   Temp 98.3 F (36.8 C)   Wt 189 lb 3.2 oz (85.8 kg)   LMP  (LMP Unknown)   SpO2 98%   BMI 34.61 kg/m    Subjective:    Patient ID: Daisy Watkins, female    DOB: 1933-03-12, 86 y.o.   MRN: [id number]  HPI: Daisy Watkins is a 86 y.o. female  Chief Complaint  Patient presents with  . Diabetes  . Depression  . Anxiety  . Urinary Tract Infection    Pt states she has been having some itchiness, odor and frequent urination    DIABETES Hypoglycemic episodes:no Polydipsia/polyuria: yes Visual disturbance: no Chest pain: no Paresthesias: no Glucose Monitoring: yes  Accucheck frequency: very occasionally Taking Insulin?: yes  Long acting insulin: Blood Pressure Monitoring: not checking Retinal Examination: Up to Date Foot Exam: Up to Date Diabetic Education: Completed Pneumovax: Up to Date Influenza: Up to Date Aspirin: yes  HYPERTENSION / HYPERLIPIDEMIA Satisfied with current treatment? yes Duration of hypertension: chronic BP monitoring frequency: not checking BP medication side effects: no Past BP meds: ramipril and amlodipine Duration of hyperlipidemia: chronic Cholesterol medication side effects: no Cholesterol supplements: none Medication compliance: excellent compliance Aspirin: yes Recent stressors: yes Recurrent headaches: no Visual changes: no Palpitations: no Dyspnea: no Chest pain: no Lower extremity edema: no Dizzy/lightheaded: no  ANXIETY/STRESS Duration:stable Anxious mood: yes  Excessive worrying: yes Irritability: yes  Sweating: no Nausea: no Palpitations:no Hyperventilation: no Panic attacks: no Agoraphobia: no  Obscessions/compulsions: no Depressed mood: no Depression screen [facility] 2/9 03/21/2020 12/23/2019 11/07/2019 04/25/2019 10/21/2018  Decreased Interest 0 - 0 3 0  Down, Depressed, Hopeless 2 - 3 3 0  PHQ - 2 Score 2 - 3 6 0  Altered sleeping  2 0 0 3 3  Tired, decreased energy 1 1 0 3 1  Change in appetite 1 0 0 3 3  Feeling bad or failure about yourself  0 1 0 0 0  Trouble concentrating 0 0 0 0 0  Moving slowly or fidgety/restless 0 0 0 0 0  Suicidal thoughts 0 0 0 0 0  PHQ-9 Score 6 - 3 15 7   Difficult doing work/chores Not difficult at all - Not difficult at all Very difficult -  Some recent data might be hidden   Anhedonia: no Weight changes: no Insomnia: yes hard to fall asleep  Hypersomnia: no Fatigue/loss of energy: yes Feelings of worthlessness: no Feelings of guilt: no Impaired concentration/indecisiveness: no Suicidal ideations: no  Crying spells: no Recent Stressors/Life Changes: yes   Relationship problems: no   Family stress: yes     Financial stress: no    Job stress: no    Recent death/loss: yes   Relevant past medical, surgical, family and social history reviewed and updated as indicated. Interim medical history since our last visit reviewed. Allergies and medications reviewed and updated.  Review of Systems  Constitutional: Negative.   Respiratory: Negative.   Cardiovascular: Negative.   Gastrointestinal: Negative.   Genitourinary: Positive for frequency, urgency and vaginal discharge. Negative for decreased urine volume, difficulty urinating, dyspareunia, dysuria, enuresis, flank pain, genital sores, hematuria, menstrual problem, pelvic pain, vaginal bleeding and vaginal pain.  Musculoskeletal: Negative.   Neurological: Negative.   Psychiatric/Behavioral: Negative for agitation, behavioral problems, confusion, decreased concentration, dysphoric mood, hallucinations, self-injury, sleep disturbance and suicidal ideas. The patient is nervous/anxious. The patient is not hyperactive.  Per HPI unless specifically indicated above     Objective:    BP 128/86 (BP Location: Left Arm, Patient Position: Sitting, Cuff Size: Normal)   Pulse 87   Temp 98.3 F (36.8 C)   Wt 189 lb 3.2 oz (85.8 kg)    LMP  (LMP Unknown)   SpO2 98%   BMI 34.61 kg/m   Wt Readings from Last 3 Encounters:  03/21/20 189 lb 3.2 oz (85.8 kg)  12/23/19 189 lb (85.7 kg)  11/21/19 190 lb 12.8 oz (86.5 kg)    Physical Exam Vitals and nursing note reviewed.  Constitutional:      General: She is not in acute distress.    Appearance: Normal appearance. She is not ill-appearing, toxic-appearing or diaphoretic.  HENT:     Head: Normocephalic and atraumatic.     Right Ear: External ear normal.     Left Ear: External ear normal.     Nose: Nose normal.     Mouth/Throat:     Mouth: Mucous membranes are moist.     Pharynx: Oropharynx is clear.  Eyes:     General: No scleral icterus.       Right eye: No discharge.        Left eye: No discharge.     Extraocular Movements: Extraocular movements intact.     Conjunctiva/sclera: Conjunctivae normal.     Pupils: Pupils are equal, round, and reactive to light.  Cardiovascular:     Rate and Rhythm: Normal rate and regular rhythm.     Pulses: Normal pulses.     Heart sounds: Normal heart sounds. No murmur heard. No friction rub. No gallop.   Pulmonary:     Effort: Pulmonary effort is normal. No respiratory distress.     Breath sounds: Normal breath sounds. No stridor. No wheezing, rhonchi or rales.  Chest:     Chest wall: No tenderness.  Musculoskeletal:        General: Normal range of motion.     Cervical back: Normal range of motion and neck supple.  Skin:    General: Skin is warm and dry.     Capillary Refill: Capillary refill takes less than 2 seconds.     Coloration: Skin is not jaundiced or pale.     Findings: No bruising, erythema, lesion or rash.  Neurological:     General: No focal deficit present.     Mental Status: She is alert and oriented to person, place, and time. Mental status is at baseline.  Psychiatric:        Mood and Affect: Mood normal.        Behavior: Behavior normal.        Thought Content: Thought content normal.        Judgment:  Judgment normal.     Results for orders placed or performed in visit on 12/23/19  Bayer DCA Hb A1c Waived  Result Value Ref Range   HB A1C (BAYER DCA - WAIVED) 9.1 (H) <7.0 %      Assessment & Plan:   Problem List Items Addressed This Visit      Cardiovascular and Mediastinum   Benign essential hypertension    Under good control on current regimen. Continue current regimen. Continue to monitor. Call with any concerns. Refills up to date. Labs drawn today.        Relevant Medications   amLODipine (NORVASC) 10 MG tablet   Other Relevant Orders   Comprehensive metabolic panel     Endocrine  Type 2 diabetes mellitus with hyperglycemia (HCC) - Primary    Going in the wrong direction with A1c of 9.4- will increase her trulicity to 1.5mg  and recheck 3 months. Encouraged her to call if she is having trouble getting it and not to stop it. Call with any concerns. Continue tresiba.      Relevant Medications   Dulaglutide (TRULICITY) 1.5 MG/0.5ML SOPN   Other Relevant Orders   Bayer DCA Hb A1c Waived   Comprehensive metabolic panel     Other   Hyperlipidemia, mixed    Under good control on current regimen. Continue current regimen. Continue to monitor. Call with any concerns. Refills up to date. Labs drawn today.       Relevant Medications   amLODipine (NORVASC) 10 MG tablet   Other Relevant Orders   Comprehensive metabolic panel   Lipid Panel w/o Chol/HDL Ratio   Anxiety, generalized    Under good control on current regimen. Continue current regimen. Continue to monitor. Call with any concerns. Refills given for 3 months. Follow up 3 months.        Relevant Medications   LORazepam (ATIVAN) 1 MG tablet   Insomnia    Under good control on current regimen. Continue current regimen. Continue to monitor. Call with any concerns. Refills given for 3 months. Follow up 3 months.          Other Visit Diagnoses    Vaginal discharge       UA and wet prep normal except for 3+  glucose. Reassured patient.    Relevant Orders   WET PREP FOR TRICH, YEAST, CLUE   Urinalysis, Routine w reflex microscopic       Follow up plan: Return in about 3 months (around 06/19/2020).

## 2020-03-21 NOTE — Assessment & Plan Note (Signed)
Under good control on current regimen. Continue current regimen. Continue to monitor. Call with any concerns. Refills given for 3 months. Follow up 3 months.    

## 2020-03-21 NOTE — Assessment & Plan Note (Signed)
Under good control on current regimen. Continue current regimen. Continue to monitor. Call with any concerns. Refills up to date. Labs drawn today.  

## 2020-03-21 NOTE — Assessment & Plan Note (Signed)
Going in the wrong direction with A1c of 9.4- will increase her trulicity to 1.5mg  and recheck 3 months. Encouraged her to call if she is having trouble getting it and not to stop it. Call with any concerns. Continue tresiba.

## 2020-03-22 ENCOUNTER — Ambulatory Visit: Payer: Medicare HMO | Admitting: Family Medicine

## 2020-03-22 LAB — URINALYSIS, ROUTINE W REFLEX MICROSCOPIC
Bilirubin, UA: NEGATIVE
Ketones, UA: NEGATIVE
Leukocytes,UA: NEGATIVE
Nitrite, UA: NEGATIVE
Protein,UA: NEGATIVE
Specific Gravity, UA: 1.015 (ref 1.005–1.030)
Urobilinogen, Ur: 0.2 mg/dL (ref 0.2–1.0)
pH, UA: 5 (ref 5.0–7.5)

## 2020-03-22 LAB — COMPREHENSIVE METABOLIC PANEL
ALT: 15 IU/L (ref 0–32)
AST: 11 IU/L (ref 0–40)
Albumin/Globulin Ratio: 1.6 (ref 1.2–2.2)
Albumin: 4 g/dL (ref 3.6–4.6)
Alkaline Phosphatase: 132 IU/L — ABNORMAL HIGH (ref 44–121)
BUN/Creatinine Ratio: 13 (ref 12–28)
BUN: 10 mg/dL (ref 8–27)
Bilirubin Total: 0.6 mg/dL (ref 0.0–1.2)
CO2: 23 mmol/L (ref 20–29)
Calcium: 10 mg/dL (ref 8.7–10.3)
Chloride: 101 mmol/L (ref 96–106)
Creatinine, Ser: 0.75 mg/dL (ref 0.57–1.00)
GFR calc Af Amer: 84 mL/min/{1.73_m2} (ref >59)
GFR calc non Af Amer: 73 mL/min/{1.73_m2} (ref >59)
Globulin, Total: 2.5 g/dL (ref 1.5–4.5)
Glucose: 376 mg/dL — ABNORMAL HIGH (ref 65–99)
Potassium: 5.2 mmol/L (ref 3.5–5.2)
Sodium: 140 mmol/L (ref 134–144)
Total Protein: 6.5 g/dL (ref 6.0–8.5)

## 2020-03-22 LAB — LIPID PANEL W/O CHOL/HDL RATIO
Cholesterol, Total: 258 mg/dL — ABNORMAL HIGH (ref 100–199)
HDL: 57 mg/dL (ref >39)
LDL Chol Calc (NIH): 153 mg/dL — ABNORMAL HIGH (ref 0–99)
Triglycerides: 262 mg/dL — ABNORMAL HIGH (ref 0–149)
VLDL Cholesterol Cal: 48 mg/dL — ABNORMAL HIGH (ref 5–40)

## 2020-03-22 LAB — MICROSCOPIC EXAMINATION

## 2020-03-22 LAB — BAYER DCA HB A1C WAIVED: HB A1C (BAYER DCA - WAIVED): 9.4 % — ABNORMAL HIGH (ref <7.0)

## 2020-03-22 LAB — WET PREP FOR TRICH, YEAST, CLUE
Clue Cell Exam: NEGATIVE
Trichomonas Exam: NEGATIVE
Yeast Exam: NEGATIVE

## 2020-05-21 NOTE — Telephone Encounter (Signed)
Encounter opened in error

## 2020-05-29 ENCOUNTER — Other Ambulatory Visit: Payer: Self-pay | Admitting: Family Medicine

## 2020-05-29 NOTE — Telephone Encounter (Signed)
Requested Prescriptions  Pending Prescriptions Disp Refills  . TRESIBA 100 UNIT/ML SOLN [Pharmacy Med Name: TRESIBA 100 UNIT/ML VIAL] 10 mL 0    Sig: INJECT 24 UNITS INTO THE SKIN AT BEDTIME.     Endocrinology:  Diabetes - Insulins Failed - 05/29/2020  1:30 AM      Failed - HBA1C is between 0 and 7.9 and within 180 days    Hemoglobin A1C  Date Value Ref Range Status  11/23/2015 8.4%  Final   HB A1C (BAYER DCA - WAIVED)  Date Value Ref Range Status  03/21/2020 9.4 (H) <7.0 % Final    Comment:                                          Diabetic Adult            <7.0                                       Healthy Adult        4.3 - 5.7                                                           (DCCT/NGSP) American Diabetes Association's Summary of Glycemic Recommendations for Adults with Diabetes: Hemoglobin A1c <7.0%. More stringent glycemic goals (A1c <6.0%) may further reduce complications at the cost of increased risk of hypoglycemia.          Passed - Valid encounter within last 6 months    Recent Outpatient Visits          2 months ago Type 2 diabetes mellitus with hyperglycemia, with long-term current use of insulin ([place])   [employer] [place], Megan P, DO   5 months ago Type 2 diabetes mellitus with hyperglycemia, with long-term current use of insulin ([place])   [facility], Megan P, DO   6 months ago Type 2 diabetes mellitus with hyperglycemia, with long-term current use of insulin ([place])   [facility], Megan P, DO   7 months ago Primary insomnia   [place], Megan P, DO   8 months ago Type 2 diabetes mellitus with hyperglycemia, with long-term current use of insulin ([place])   [place], [place], DO      Future Appointments            In 3 weeks Wynetta Emery, Barb Merino, DO [place], [facility]   In 5 months  [employer] [employer], [facility]

## 2020-06-09 ENCOUNTER — Other Ambulatory Visit: Payer: Self-pay | Admitting: Family Medicine

## 2020-06-09 NOTE — Telephone Encounter (Signed)
Requested medication (s) are due for refill today: yes  Requested medication (s) are on the active medication list: expired  Last refill:  02/15/20  Future visit scheduled: yes  Notes to clinic:  prescription expired 03/16/20   Requested Prescriptions  Pending Prescriptions Disp Refills   ELIQUIS 5 MG TABS tablet [Pharmacy Med Name: ELIQUIS 5 MG TABLET] 180 tablet     Sig: TAKE 1 TABLET (5 MG TOTAL) BY MOUTH 2 (TWO) TIMES DAILY. NEED OFFICE VISIT FOR FURTHER REFILLS      Hematology:  Anticoagulants Passed - 06/09/2020 11:39 AM      Passed - HGB in normal range and within 360 days    Hemoglobin  Date Value Ref Range Status  07/21/2019 14.2 11.1 - 15.9 g/dL Final          Passed - PLT in normal range and within 360 days    Platelets  Date Value Ref Range Status  07/21/2019 230 150 - 450 x10E3/uL Final          Passed - HCT in normal range and within 360 days    Hematocrit  Date Value Ref Range Status  07/21/2019 43.7 34.0 - 46.6 % Final          Passed - Cr in normal range and within 360 days    Creatinine  Date Value Ref Range Status  01/27/2014 0.66 0.60 - 1.30 mg/dL Final   Creatinine, Ser  Date Value Ref Range Status  03/21/2020 0.75 0.57 - 1.00 mg/dL Final          Passed - Valid encounter within last 12 months    Recent Outpatient Visits           2 months ago Type 2 diabetes mellitus with hyperglycemia, with long-term current use of insulin ([place])   [place], Megan P, DO   5 months ago Type 2 diabetes mellitus with hyperglycemia, with long-term current use of insulin ([place])   [place], Megan P, DO   6 months ago Type 2 diabetes mellitus with hyperglycemia, with long-term current use of insulin ([place])   [place], Megan P, DO   8 months ago Primary insomnia   [facility] [place], Megan P, DO   9 months ago Type 2 diabetes mellitus with hyperglycemia, with long-term current  use of insulin ([place])   [facility] [employer] [place], [place], DO       Future Appointments             In 1 week [place], Megan P, DO [facility], [facility]   In 5 months  [facility], [facility]              Signed Prescriptions Disp Refills   sertraline (ZOLOFT) 50 MG tablet 90 tablet 1    Sig: TAKE 1 TABLET BY MOUTH EVERY DAY      Psychiatry:  Antidepressants - SSRI Passed - 06/09/2020 11:39 AM      Passed - Completed PHQ-2 or PHQ-9 in the last 360 days      Passed - Valid encounter within last 6 months    Recent Outpatient Visits           2 months ago Type 2 diabetes mellitus with hyperglycemia, with long-term current use of insulin ([place])   [place], Megan P, DO   5 months ago Type 2 diabetes mellitus with hyperglycemia, with long-term current use of insulin ([place])  [place], Megan P, DO   6 months ago Type 2 diabetes mellitus with hyperglycemia, with long-term current use of insulin ([place])   [facility], Megan P, DO   8 months ago Primary insomnia   [place], Megan P, DO   9 months ago Type 2 diabetes mellitus with hyperglycemia, with long-term current use of insulin [facility])   [place], [place], DO       Future Appointments             In 1 week Wynetta Emery, Barb Merino, DO [place], [place]   In 5 months  [facility], [facility]

## 2020-06-09 NOTE — Telephone Encounter (Signed)
Requested Prescriptions  Pending Prescriptions Disp Refills  . ELIQUIS 5 MG TABS tablet [Pharmacy Med Name: ELIQUIS 5 MG TABLET] 180 tablet     Sig: TAKE 1 TABLET (5 MG TOTAL) BY MOUTH 2 (TWO) TIMES DAILY. NEED OFFICE VISIT FOR FURTHER REFILLS     Hematology:  Anticoagulants Passed - 06/09/2020 11:39 AM      Passed - HGB in normal range and within 360 days    Hemoglobin  Date Value Ref Range Status  07/21/2019 14.2 11.1 - 15.9 g/dL Final         Passed - PLT in normal range and within 360 days    Platelets  Date Value Ref Range Status  07/21/2019 230 150 - 450 x10E3/uL Final         Passed - HCT in normal range and within 360 days    Hematocrit  Date Value Ref Range Status  07/21/2019 43.7 34.0 - 46.6 % Final         Passed - Cr in normal range and within 360 days    Creatinine  Date Value Ref Range Status  01/27/2014 0.66 0.60 - 1.30 mg/dL Final   Creatinine, Ser  Date Value Ref Range Status  03/21/2020 0.75 0.57 - 1.00 mg/dL Final         Passed - Valid encounter within last 12 months    Recent Outpatient Visits          2 months ago Type 2 diabetes mellitus with hyperglycemia, with long-term current use of insulin ([place])   [place], Megan P, DO   5 months ago Type 2 diabetes mellitus with hyperglycemia, with long-term current use of insulin ([place])   [place], Megan P, DO   6 months ago Type 2 diabetes mellitus with hyperglycemia, with long-term current use of insulin ([place])   [place], Megan P, DO   8 months ago Primary insomnia   [facility] [place], Megan P, DO   9 months ago Type 2 diabetes mellitus with hyperglycemia, with long-term current use of insulin ([place])   [place], [place], DO      Future Appointments            In 1 week Wynetta Emery, Barb Merino, DO [place], [facility]   In 5 months  [facility], [facility]           . sertraline  (ZOLOFT) 50 MG tablet [Pharmacy Med Name: SERTRALINE HCL 50 MG TABLET] 90 tablet 1    Sig: TAKE 1 TABLET BY MOUTH EVERY DAY     Psychiatry:  Antidepressants - SSRI Passed - 06/09/2020 11:39 AM      Passed - Completed PHQ-2 or PHQ-9 in the last 360 days      Passed - Valid encounter within last 6 months    Recent Outpatient Visits          2 months ago Type 2 diabetes mellitus with hyperglycemia, with long-term current use of insulin ([place])   [place], Megan P, DO   5 months ago Type 2 diabetes mellitus with hyperglycemia, with long-term current use of insulin ([place])   [place], Megan P, DO   6 months ago Type 2 diabetes mellitus with hyperglycemia, with long-term current use of insulin ([place])   [place], Megan P, DO   8 months ago Primary insomnia   [facility] [place], [place],  DO   9 months ago Type 2 diabetes mellitus with hyperglycemia, with long-term current use of insulin ([place])   [place], [place], DO      Future Appointments            In 1 week Wynetta Emery, Barb Merino, DO [place], [place]   In 5 months  [facility], [facility]

## 2020-06-11 NOTE — Telephone Encounter (Signed)
Patient needs an appointment prior to refill

## 2020-06-11 NOTE — Telephone Encounter (Signed)
Pt is scheduled 3/22

## 2020-06-19 ENCOUNTER — Ambulatory Visit: Payer: Medicare HMO | Admitting: Family Medicine

## 2020-06-22 ENCOUNTER — Ambulatory Visit: Payer: Medicare HMO | Admitting: Family Medicine

## 2020-06-26 ENCOUNTER — Other Ambulatory Visit: Payer: Self-pay | Admitting: Family Medicine

## 2020-06-26 ENCOUNTER — Ambulatory Visit: Payer: Medicare HMO | Admitting: Family Medicine

## 2020-06-26 NOTE — Telephone Encounter (Signed)
Requested medications are due for refill today yes  Requested medications are on the active medication list yes  Last refill 3/1  Future visit scheduled has appt with Dr Wynetta Emery today  Notes to clinic Has appt today, please assess.

## 2020-07-04 ENCOUNTER — Other Ambulatory Visit: Payer: Self-pay | Admitting: Family Medicine

## 2020-07-26 ENCOUNTER — Other Ambulatory Visit: Payer: Self-pay | Admitting: Family Medicine

## 2020-07-26 NOTE — Telephone Encounter (Signed)
Requested Prescriptions  Pending Prescriptions Disp Refills  . TRESIBA 100 UNIT/ML SOLN [Pharmacy Med Name: TRESIBA 100 UNIT/ML VIAL] 10 mL 2    Sig: INJECT 24 UNITS INTO THE SKIN AT BEDTIME.     Endocrinology:  Diabetes - Insulins Failed - 07/26/2020  1:33 AM      Failed - HBA1C is between 0 and 7.9 and within 180 days    Hemoglobin A1C  Date Value Ref Range Status  11/23/2015 8.4%  Final   HB A1C (BAYER DCA - WAIVED)  Date Value Ref Range Status  03/21/2020 9.4 (H) <7.0 % Final    Comment:                                          Diabetic Adult            <7.0                                       Healthy Adult        4.3 - 5.7                                                           (DCCT/NGSP) American Diabetes Association's Summary of Glycemic Recommendations for Adults with Diabetes: Hemoglobin A1c <7.0%. More stringent glycemic goals (A1c <6.0%) may further reduce complications at the cost of increased risk of hypoglycemia.          Passed - Valid encounter within last 6 months    Recent Outpatient Visits          4 months ago Type 2 diabetes mellitus with hyperglycemia, with long-term current use of insulin ([place])   [place], Megan P, DO   7 months ago Type 2 diabetes mellitus with hyperglycemia, with long-term current use of insulin ([place])   [facility], Megan P, DO   8 months ago Type 2 diabetes mellitus with hyperglycemia, with long-term current use of insulin ([place])   [facility], Megan P, DO   9 months ago Primary insomnia   [place], Megan P, DO   10 months ago Type 2 diabetes mellitus with hyperglycemia, with long-term current use of insulin ([place])   [place], Megan P, DO      Future Appointments            In 3 months [place], [facility]

## 2020-08-15 ENCOUNTER — Other Ambulatory Visit: Payer: Self-pay | Admitting: Family Medicine

## 2020-08-15 NOTE — Telephone Encounter (Signed)
Sorry, I have re-routed this to the correct practice, please disregard.

## 2020-08-15 NOTE — Telephone Encounter (Signed)
Requested medications are due for refill today yes  Requested medications are on the active medication list yes  Last refill 07/19/20  Last visit 03/20/20  Future visit scheduled 10/2020  Notes to clinic Was to return in 3 months, (March) does have appt in August, please assess.

## 2020-08-15 NOTE — Telephone Encounter (Signed)
Requested medications are due for refill today yes  Requested medications are on the active medication list yes  Last refill 07/19/20  Last visit 03/20/20  Future visit scheduled 10/2020  Notes to clinic Was to return in 3 months, (March) does have appt in August, please assess.  

## 2020-08-15 NOTE — Telephone Encounter (Signed)
Routing to provider  

## 2020-08-28 ENCOUNTER — Ambulatory Visit: Payer: Medicare HMO | Admitting: Family Medicine

## 2020-09-03 ENCOUNTER — Other Ambulatory Visit: Payer: Self-pay | Admitting: Family Medicine

## 2020-09-03 DIAGNOSIS — I10 Essential (primary) hypertension: Secondary | ICD-10-CM

## 2020-09-12 ENCOUNTER — Telehealth: Payer: Self-pay

## 2020-09-12 NOTE — Telephone Encounter (Signed)
Copied from [place] [id number]. Topic: General - Other >> Sep 12, 2020  9:07 AM Keene Breath wrote: Reason for CRM: Patient's daughter called to ask for advice for patient since she has stopped taking her medication.  Daughter needs advice on what she should do or if there is counseling or some other direction she can take to get her mother care and monitor her medication.  Please advise and call to discuss at [phone]

## 2020-09-13 NOTE — Telephone Encounter (Signed)
Called [name] pt's daughter to schedule appt. She states that pt is saying that she doesn't want to take the medicine or go to the doctor. Her glucose was over 400 they called ems yesterday and pt refused to go so they didn't take her in. She states that pt can answer in one word answers so she doesn't know if it's the medicine or if she is getting dementia. She is wanting to know that if she isn't take her medicine or eating if it can mess up her electrolytes. [name] is wanting to know what she can do being that she isn't on any medical paperwork she is wanting to know if she needs to go to the magistrate and force her. Please advise.

## 2020-09-14 ENCOUNTER — Encounter: Payer: Self-pay | Admitting: Emergency Medicine

## 2020-09-14 ENCOUNTER — Inpatient Hospital Stay
Admission: EM | Admit: 2020-09-14 | Discharge: 2020-09-15 | DRG: 072 | Disposition: A | Discharge status: Home Health | Payer: Medicare HMO | Attending: Internal Medicine | Admitting: Internal Medicine

## 2020-09-14 ENCOUNTER — Emergency Department: Payer: Medicare HMO

## 2020-09-14 DIAGNOSIS — F322 Major depressive disorder, single episode, severe without psychotic features: Secondary | ICD-10-CM | POA: Diagnosis not present

## 2020-09-14 DIAGNOSIS — Z91128 Patient's intentional underdosing of medication regimen for other reason: Secondary | ICD-10-CM

## 2020-09-14 DIAGNOSIS — G9340 Encephalopathy, unspecified: Secondary | ICD-10-CM | POA: Diagnosis not present

## 2020-09-14 DIAGNOSIS — D329 Benign neoplasm of meninges, unspecified: Secondary | ICD-10-CM | POA: Insufficient documentation

## 2020-09-14 DIAGNOSIS — Z888 Allergy status to other drugs, medicaments and biological substances status: Secondary | ICD-10-CM | POA: Diagnosis not present

## 2020-09-14 DIAGNOSIS — G936 Cerebral edema: Secondary | ICD-10-CM | POA: Diagnosis not present

## 2020-09-14 DIAGNOSIS — Z803 Family history of malignant neoplasm of breast: Secondary | ICD-10-CM

## 2020-09-14 DIAGNOSIS — Z20822 Contact with and (suspected) exposure to covid-19: Secondary | ICD-10-CM | POA: Diagnosis not present

## 2020-09-14 DIAGNOSIS — I10 Essential (primary) hypertension: Secondary | ICD-10-CM | POA: Insufficient documentation

## 2020-09-14 DIAGNOSIS — Z833 Family history of diabetes mellitus: Secondary | ICD-10-CM

## 2020-09-14 DIAGNOSIS — I480 Paroxysmal atrial fibrillation: Secondary | ICD-10-CM | POA: Diagnosis not present

## 2020-09-14 DIAGNOSIS — Z882 Allergy status to sulfonamides status: Secondary | ICD-10-CM

## 2020-09-14 DIAGNOSIS — Z87440 Personal history of urinary (tract) infections: Secondary | ICD-10-CM

## 2020-09-14 DIAGNOSIS — Z87891 Personal history of nicotine dependence: Secondary | ICD-10-CM | POA: Insufficient documentation

## 2020-09-14 DIAGNOSIS — F329 Major depressive disorder, single episode, unspecified: Secondary | ICD-10-CM | POA: Diagnosis not present

## 2020-09-14 DIAGNOSIS — E1165 Type 2 diabetes mellitus with hyperglycemia: Secondary | ICD-10-CM | POA: Insufficient documentation

## 2020-09-14 DIAGNOSIS — R440 Auditory hallucinations: Secondary | ICD-10-CM | POA: Diagnosis not present

## 2020-09-14 DIAGNOSIS — K219 Gastro-esophageal reflux disease without esophagitis: Secondary | ICD-10-CM | POA: Diagnosis present

## 2020-09-14 DIAGNOSIS — Z90710 Acquired absence of both cervix and uterus: Secondary | ICD-10-CM | POA: Diagnosis not present

## 2020-09-14 DIAGNOSIS — R4182 Altered mental status, unspecified: Secondary | ICD-10-CM | POA: Diagnosis present

## 2020-09-14 DIAGNOSIS — F0390 Unspecified dementia without behavioral disturbance: Secondary | ICD-10-CM | POA: Diagnosis present

## 2020-09-14 DIAGNOSIS — Y92009 Unspecified place in unspecified non-institutional (private) residence as the place of occurrence of the external cause: Secondary | ICD-10-CM

## 2020-09-14 DIAGNOSIS — Y900 Blood alcohol level of less than 20 mg/100 ml: Secondary | ICD-10-CM | POA: Diagnosis present

## 2020-09-14 DIAGNOSIS — R410 Disorientation, unspecified: Secondary | ICD-10-CM | POA: Diagnosis not present

## 2020-09-14 DIAGNOSIS — Z79899 Other long term (current) drug therapy: Secondary | ICD-10-CM | POA: Insufficient documentation

## 2020-09-14 DIAGNOSIS — I2510 Atherosclerotic heart disease of native coronary artery without angina pectoris: Secondary | ICD-10-CM | POA: Insufficient documentation

## 2020-09-14 DIAGNOSIS — Z881 Allergy status to other antibiotic agents status: Secondary | ICD-10-CM

## 2020-09-14 DIAGNOSIS — Z86718 Personal history of other venous thrombosis and embolism: Secondary | ICD-10-CM | POA: Diagnosis not present

## 2020-09-14 DIAGNOSIS — F411 Generalized anxiety disorder: Secondary | ICD-10-CM | POA: Diagnosis present

## 2020-09-14 DIAGNOSIS — T383X6A Underdosing of insulin and oral hypoglycemic [antidiabetic] drugs, initial encounter: Secondary | ICD-10-CM | POA: Diagnosis present

## 2020-09-14 DIAGNOSIS — R413 Other amnesia: Secondary | ICD-10-CM

## 2020-09-14 DIAGNOSIS — Z8249 Family history of ischemic heart disease and other diseases of the circulatory system: Secondary | ICD-10-CM

## 2020-09-14 DIAGNOSIS — G9341 Metabolic encephalopathy: Secondary | ICD-10-CM | POA: Diagnosis not present

## 2020-09-14 DIAGNOSIS — Z7901 Long term (current) use of anticoagulants: Secondary | ICD-10-CM

## 2020-09-14 DIAGNOSIS — Z7982 Long term (current) use of aspirin: Secondary | ICD-10-CM | POA: Insufficient documentation

## 2020-09-14 DIAGNOSIS — F03918 Unspecified dementia, unspecified severity, with other behavioral disturbance: Secondary | ICD-10-CM | POA: Insufficient documentation

## 2020-09-14 DIAGNOSIS — E785 Hyperlipidemia, unspecified: Secondary | ICD-10-CM | POA: Diagnosis present

## 2020-09-14 LAB — CBC
HCT: 42.7 % (ref 36.0–46.0)
Hemoglobin: 14.6 g/dL (ref 12.0–15.0)
MCH: 29.1 pg (ref 26.0–34.0)
MCHC: 34.2 g/dL (ref 30.0–36.0)
MCV: 85.1 fL (ref 80.0–100.0)
Platelets: 172 10*3/uL (ref 150–400)
RBC: 5.02 MIL/uL (ref 3.87–5.11)
RDW: 13.2 % (ref 11.5–15.5)
WBC: 6.1 10*3/uL (ref 4.0–10.5)
nRBC: 0 % (ref 0.0–0.2)

## 2020-09-14 LAB — URINE DRUG SCREEN, QUALITATIVE (ARMC ONLY)
Amphetamines, Ur Screen: NOT DETECTED
Barbiturates, Ur Screen: NOT DETECTED
Benzodiazepine, Ur Scrn: NOT DETECTED
Cannabinoid 50 Ng, Ur ~~LOC~~: NOT DETECTED
Cocaine Metabolite,Ur ~~LOC~~: NOT DETECTED
MDMA (Ecstasy)Ur Screen: NOT DETECTED
Methadone Scn, Ur: NOT DETECTED
Opiate, Ur Screen: NOT DETECTED
Phencyclidine (PCP) Ur S: NOT DETECTED
Tricyclic, Ur Screen: NOT DETECTED

## 2020-09-14 LAB — URINALYSIS, ROUTINE W REFLEX MICROSCOPIC
Bilirubin Urine: NEGATIVE
Glucose, UA: >=500 mg/dL — AB
Ketones, ur: 80 mg/dL — AB
Leukocytes,Ua: NEGATIVE
Nitrite: NEGATIVE
Protein, ur: 30 mg/dL — AB
Specific Gravity, Urine: 1.026 (ref 1.005–1.030)
pH: 5 (ref 5.0–8.0)

## 2020-09-14 LAB — SALICYLATE LEVEL: Salicylate Lvl: <7 mg/dL — ABNORMAL LOW (ref 7.0–30.0)

## 2020-09-14 LAB — COMPREHENSIVE METABOLIC PANEL
ALT: 22 U/L (ref 0–44)
AST: 20 U/L (ref 15–41)
Albumin: 4.1 g/dL (ref 3.5–5.0)
Alkaline Phosphatase: 88 U/L (ref 38–126)
Anion gap: 11 (ref 5–15)
BUN: 14 mg/dL (ref 8–23)
CO2: 25 mmol/L (ref 22–32)
Calcium: 9.8 mg/dL (ref 8.9–10.3)
Chloride: 99 mmol/L (ref 98–111)
Creatinine, Ser: 0.76 mg/dL (ref 0.44–1.00)
GFR, Estimated: >60 mL/min (ref >60)
Glucose, Bld: 286 mg/dL — ABNORMAL HIGH (ref 70–99)
Potassium: 3.9 mmol/L (ref 3.5–5.1)
Sodium: 135 mmol/L (ref 135–145)
Total Bilirubin: 2 mg/dL — ABNORMAL HIGH (ref 0.3–1.2)
Total Protein: 7.3 g/dL (ref 6.5–8.1)

## 2020-09-14 LAB — ACETAMINOPHEN LEVEL: Acetaminophen (Tylenol), Serum: <10 ug/mL — ABNORMAL LOW (ref 10–30)

## 2020-09-14 LAB — ETHANOL: Alcohol, Ethyl (B): <10 mg/dL (ref <10)

## 2020-09-14 LAB — CBG MONITORING, ED: Glucose-Capillary: 253 mg/dL — ABNORMAL HIGH (ref 70–99)

## 2020-09-14 IMAGING — CT CT HEAD W/O CM
3 series · 15 of 47 positions shown, 18 images · non-contrast
Comparison: None.

CLINICAL DATA: Mental status change

EXAM:
CT HEAD WITHOUT CONTRAST
TECHNIQUE: Contiguous axial images were obtained from the base of the skull
through the vertex without intravenous contrast.

[Series 3: head wo · axial · 0.47mm/px · z∈[+91,+216]mm · 9 of 30 slices shown, 12 images]
[im 3/30  brain]
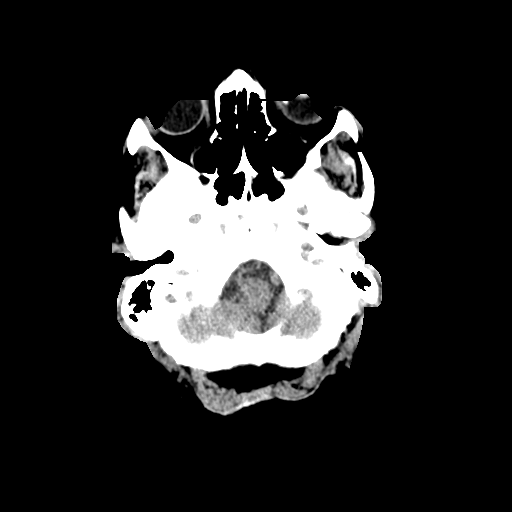
[im 3/30  bone]
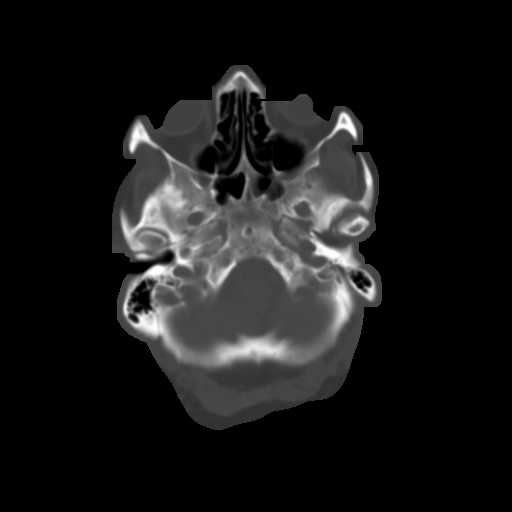
[im 6/30  brain]
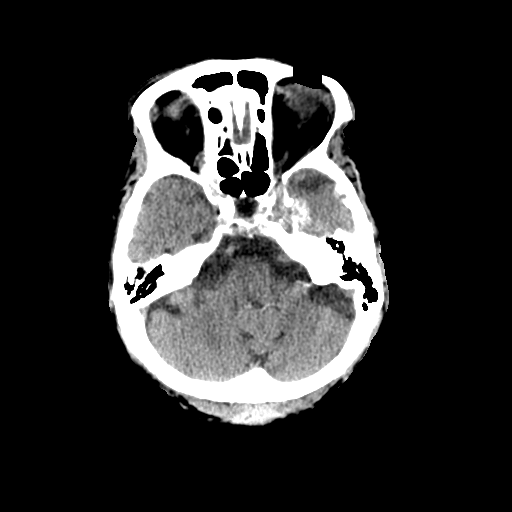
[im 9/30  brain]
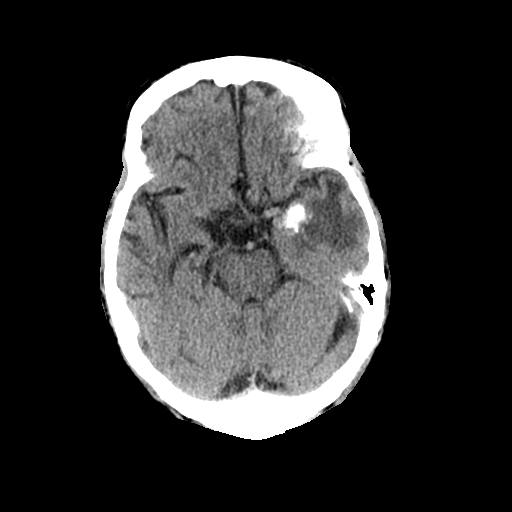
[im 12/30  brain]
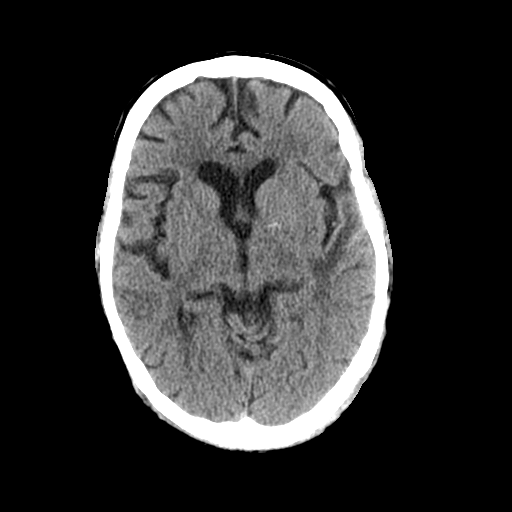
[im 16/30  brain]
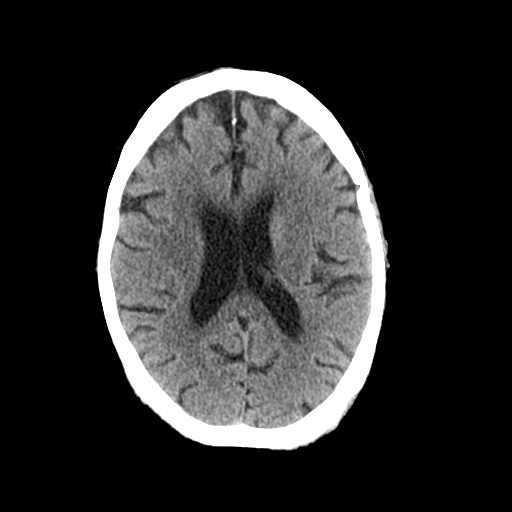
[im 16/30  bone]
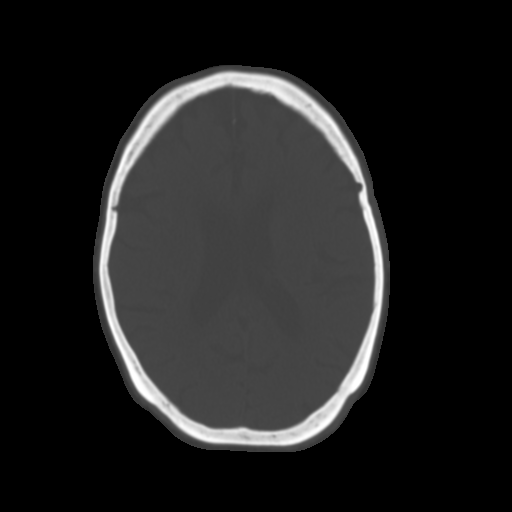
[im 19/30  brain]
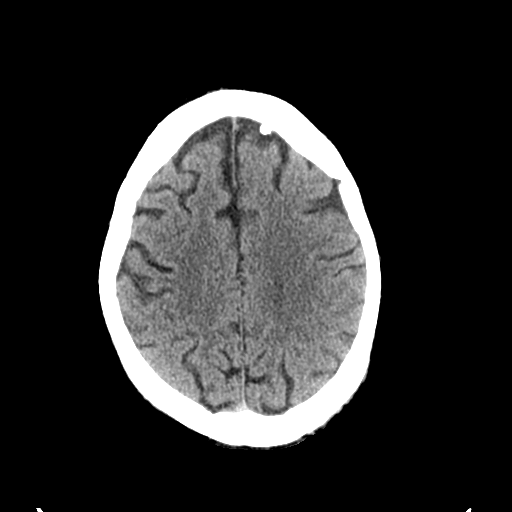
[im 22/30  brain]
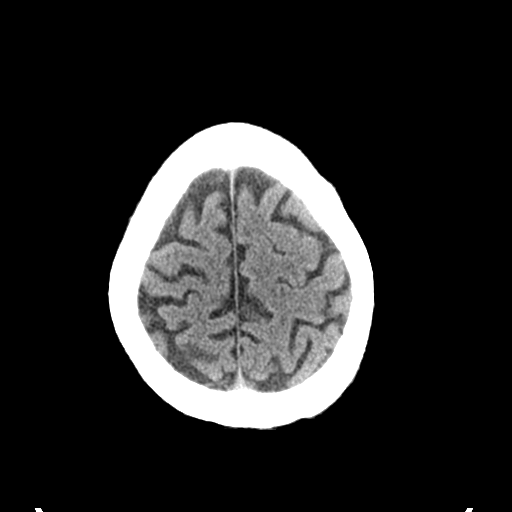
[im 25/30  brain]
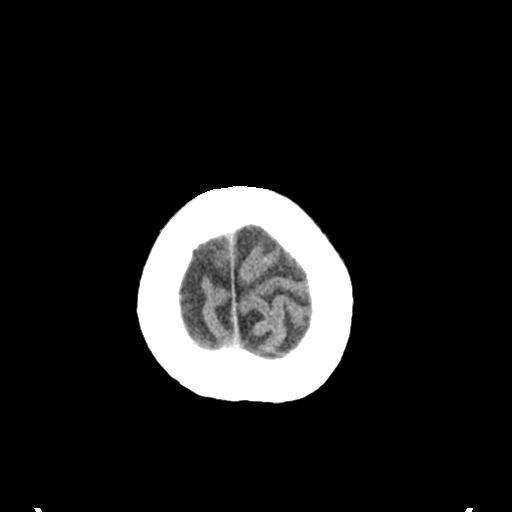
[im 28/30  brain]
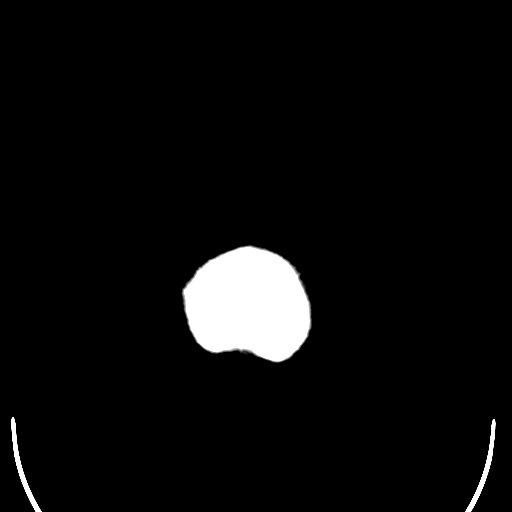
[im 28/30  bone]
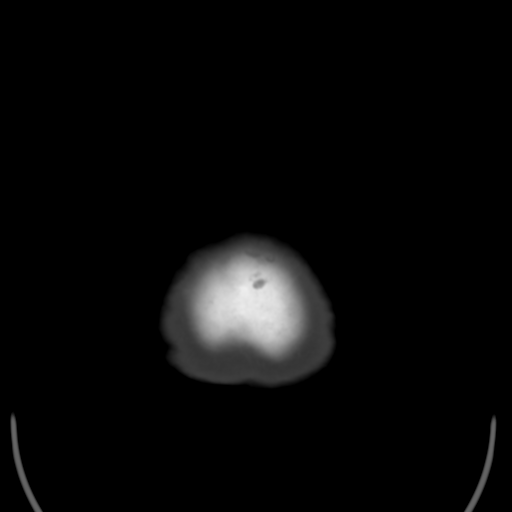

[Series 5: coronal soft tissue · coronal · 0.29mm/px · 3 of 64 slices shown]
[im 22/64  brain]
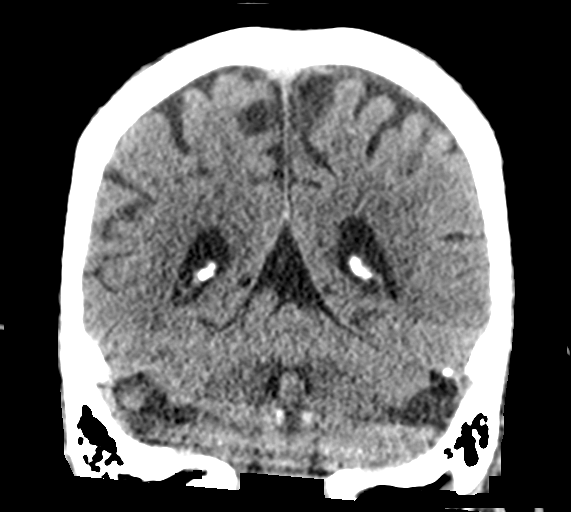
[im 29/64  brain]
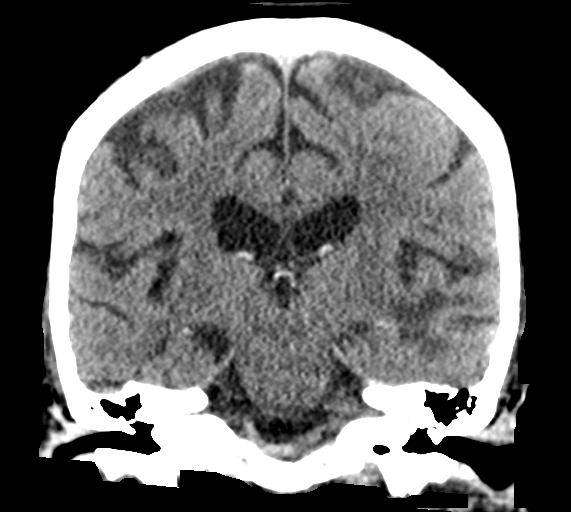
[im 36/64  brain]
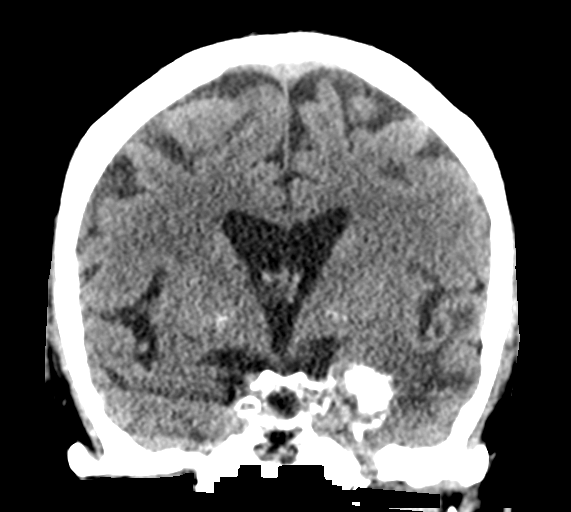

[Series 6: sagittal soft tissue · sagittal · 0.29mm/px · 3 of 53 slices shown]
[im 18/53  brain]
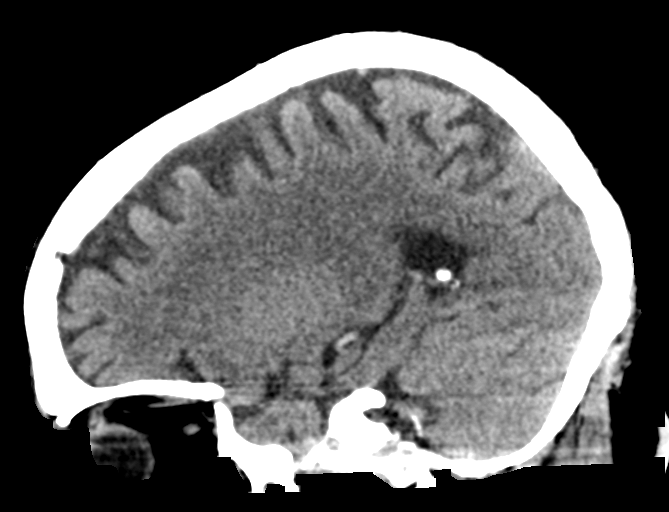
[im 27/53  brain]
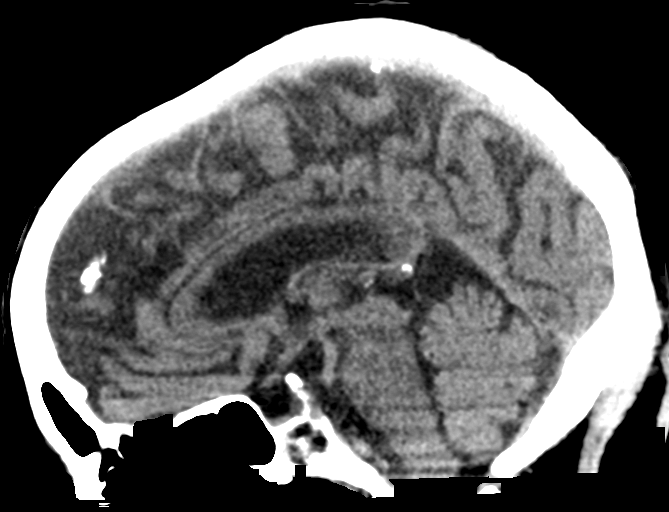
[im 35/53  brain]
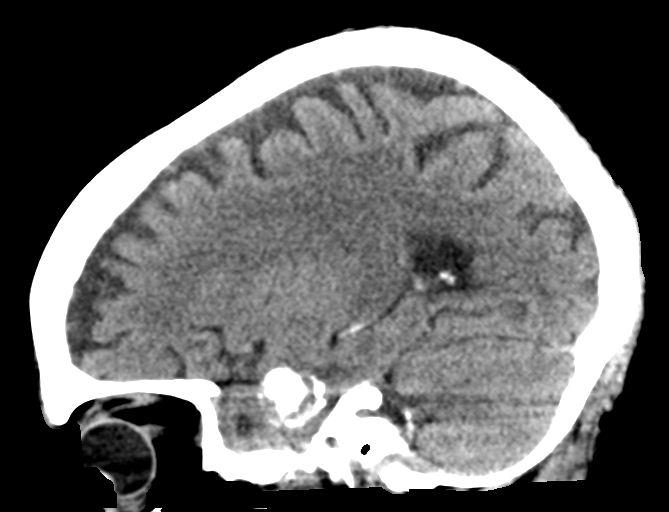

[15 of 47 positions shown; findings below may reference images not displayed]

FINDINGS: Brain: No acute territorial infarction or hemorrhage is visualized.
Moderate atrophy. Minimal chronic small vessel ischemic change of
the white matter. Low-density edema within the left temporal lobe. 2
x 2 cm partially calcified mass, probably extra-axial and abutting
the medial temporal lobe. This appears to be associated with left
cavernous sinus, series 3, image 6.

Vascular: No hyperdense vessels.  Carotid vascular calcification

Skull: Normal. Negative for fracture or focal lesion.

Sinuses/Orbits: No acute finding.

Other: None
IMPRESSION: 1. 2 cm partially calcified mass within the left middle cranial
fossa, potentially associated with the left cavernous sinus.
Findings could be secondary to meningioma, though giant aneurysm is
also in the differential. There is edema within the left temporal
lobe. Recommend further evaluation with MRI.
2. Atrophy and chronic small vessel ischemic changes of the white
matter.

## 2020-09-14 IMAGING — MR MR HEAD WO/W CM
14 series · 48 of 48 positions shown · IV contrast (gadavist)
Comparison: None.

CLINICAL DATA: Altered mental status

EXAM:
MRI HEAD WITHOUT AND WITH CONTRAST
TECHNIQUE: Multiplanar, multiecho pulse sequences of the brain and surrounding
structures were obtained without and with intravenous contrast.
CONTRAST:  7.5mL GADAVIST GADOBUTROL 1 MMOL/ML IV SOLN

[Series 5: ax dwi_tracew · axial · 3.0mm · 0.65mm/px · z∈[-106,+39]mm · 2 of 46 slices shown]
[im 1/46]
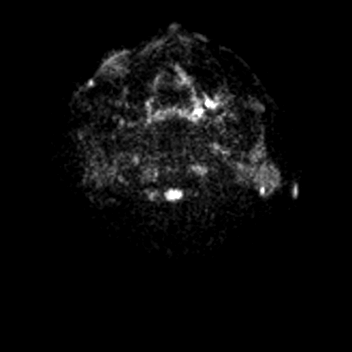
[im 46/46]
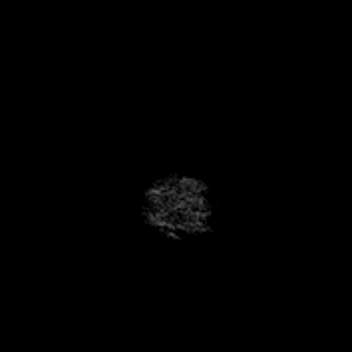

[Series 6: ax dwi_adc · axial · 3.0mm · 0.65mm/px · z∈[-106,+39]mm · 2 of 46 slices shown]
[im 1/46]
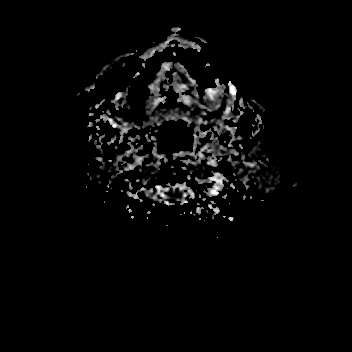
[im 46/46]
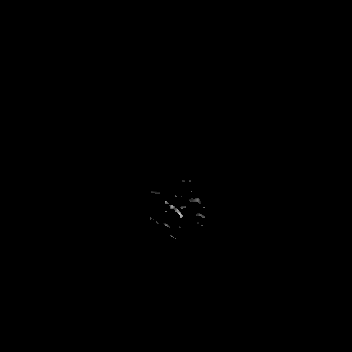

[Series 7: cor dwi_tracew · coronal · 5.0mm · 0.60mm/px · 2 of 36 slices shown]
[im 1/36]
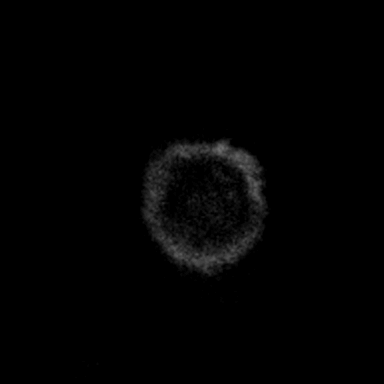
[im 36/36]
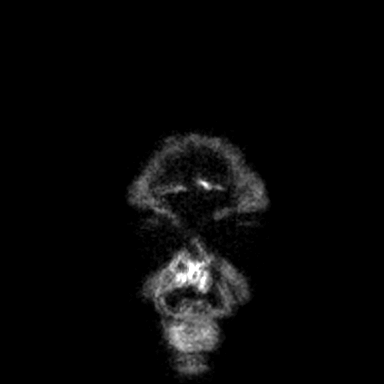

[Series 8: cor dwi_adc · coronal · 5.0mm · 0.60mm/px · 2 of 36 slices shown]
[im 1/36]
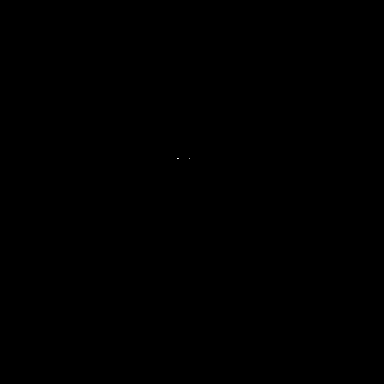
[im 36/36]
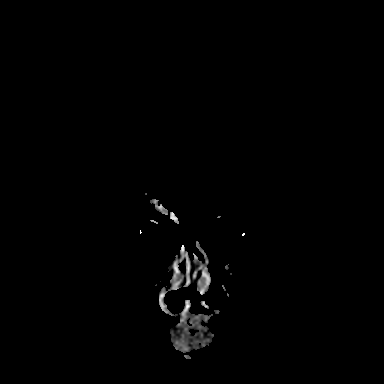

[Series 14: T1 · sagittal · 5.0mm · 0.62mm/px · 1 of 22 slices shown (1 of 2)]
[im 1/22]
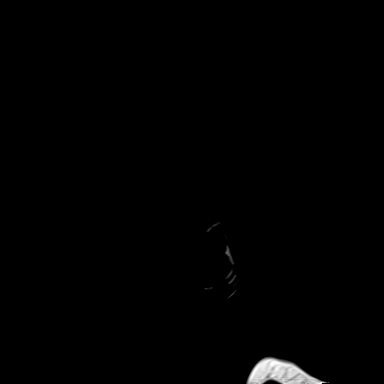

[Series 15: T2 · axial · 5.0mm · 0.53mm/px · z∈[-104,+36]mm · 2 of 25 slices shown]
[im 1/25]
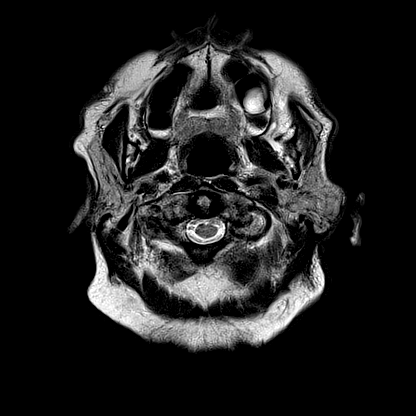
[im 25/25]
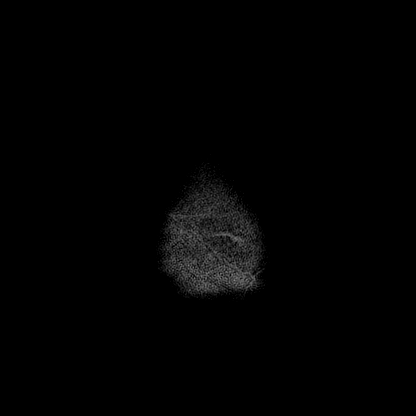

[Series 17: pha_images · axial · 3.0mm · 0.90mm/px · z∈[-118,+51]mm · 4 of 59 slices shown]
[im 1/59]
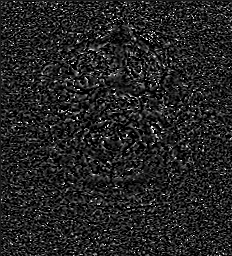
[im 20/59]
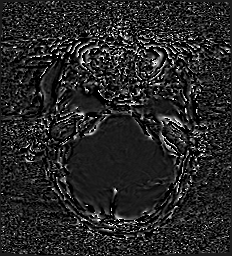
[im 39/59]
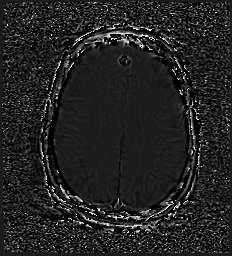
[im 59/59]
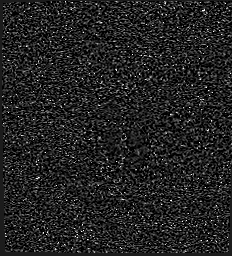

[Series 18: swi_images · axial · 3.0mm · 0.90mm/px · z∈[-118,+54]mm · 4 of 60 slices shown]
[im 1/60]
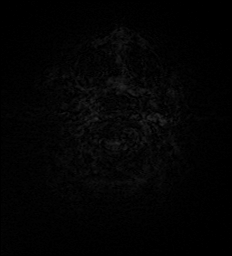
[im 20/60]
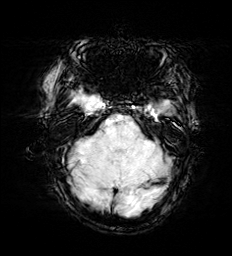
[im 40/60]
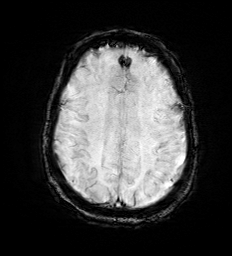
[im 60/60]
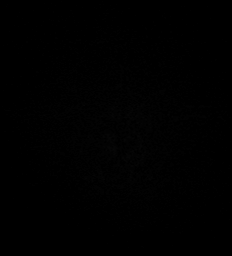

[Series 21: T1 · axial · 1.0mm · 0.98mm/px · z∈[-117,+53]mm · 11 of 176 slices shown (2 of 2)]
[im 1/176]
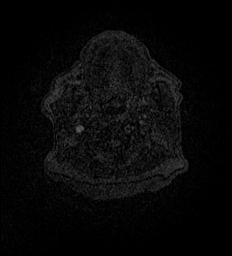
[im 18/176]
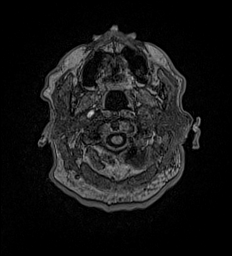
[im 36/176]
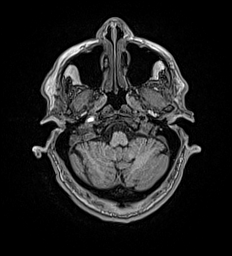
[im 53/176]
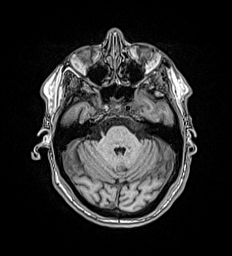
[im 71/176]
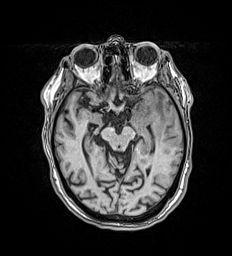
[im 88/176]
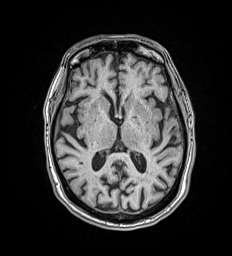
[im 106/176]
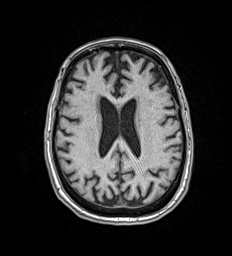
[im 123/176]
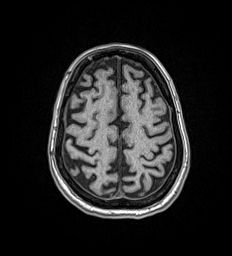
[im 141/176]
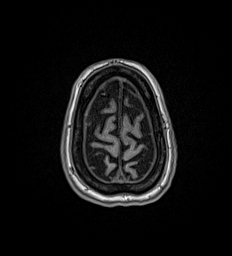
[im 158/176]
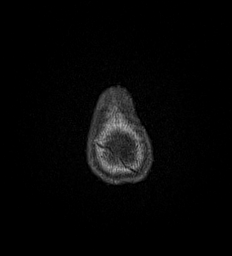
[im 176/176]
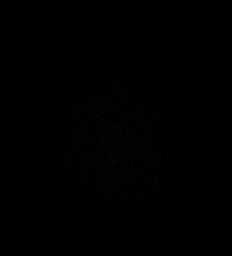

[Series 22: FLAIR · axial · 5.0mm · 1.20mm/px · z∈[-110,+42]mm · 2 of 27 slices shown]
[im 1/27]
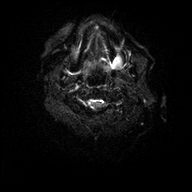
[im 27/27]
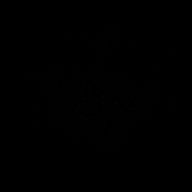

[Series 23: T2 post-contrast · coronal · 5.0mm · 0.57mm/px · 2 of 28 slices shown]
[im 1/28]
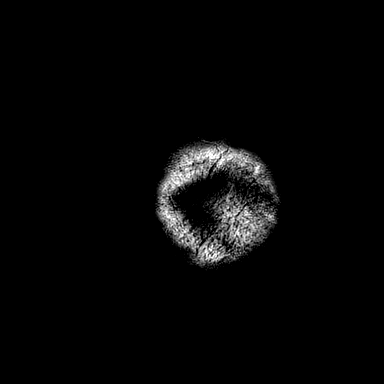
[im 28/28]
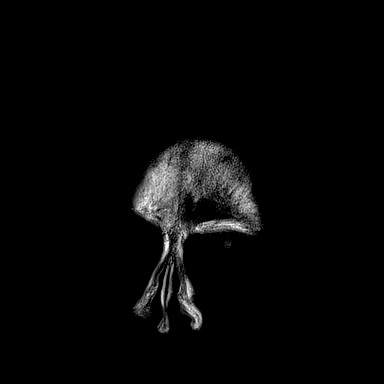

[Series 24: T1 post-contrast · axial · 1.0mm · 0.98mm/px · z∈[-117,+53]mm · 11 of 176 slices shown (1 of 3)]
[im 1/176]
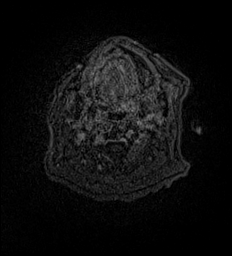
[im 18/176]
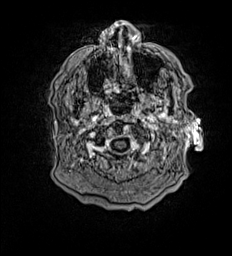
[im 36/176]
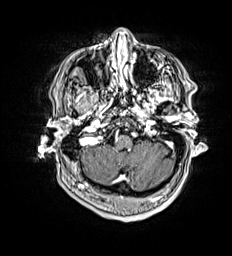
[im 53/176]
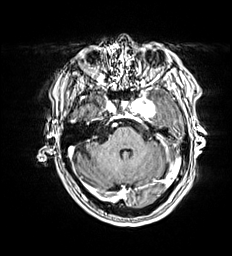
[im 71/176]
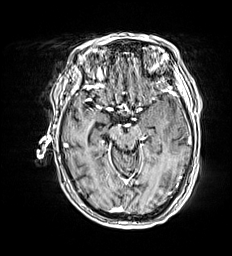
[im 88/176]
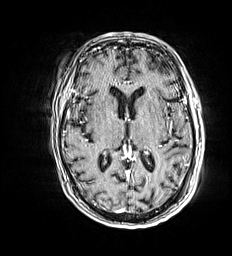
[im 106/176]
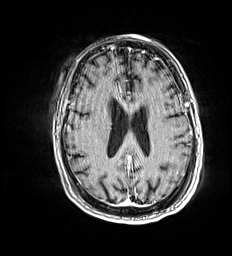
[im 123/176]
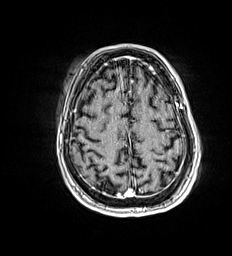
[im 141/176]
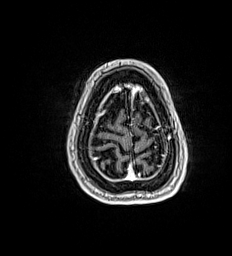
[im 158/176]
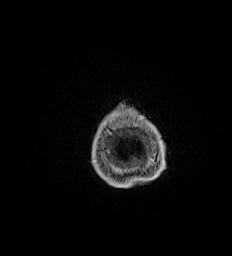
[im 176/176]
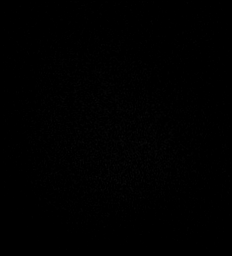

[Series 25: T1 post-contrast · coronal · 5.0mm · 0.69mm/px · 2 of 28 slices shown (2 of 3)]
[im 1/28]
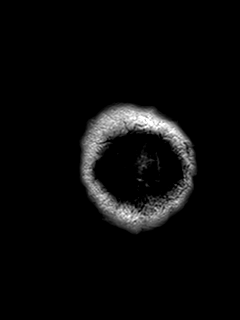
[im 28/28]
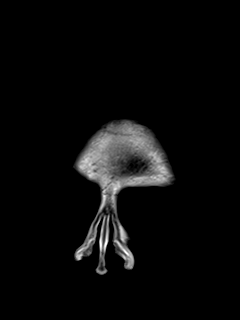

[Series 27: T1 post-contrast · sagittal · 5.0mm · 0.75mm/px · 1 of 22 slices shown (3 of 3)]
[im 1/22]
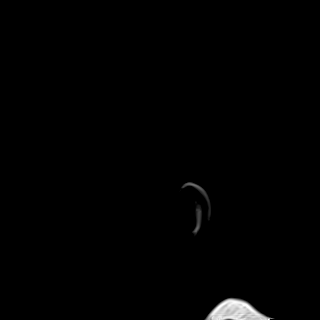

[48 of 48 positions shown; findings below may reference images not displayed]

FINDINGS: Brain: No acute infarct, mass effect or extra-axial collection. No
acute or chronic hemorrhage. Hyperintense T2-weighted signal within
the anterior left temporal lobe. The midline structures are normal.
There is a contrast-enhancing mass in the left paraclinoid region,
which is dural based. It measures 3.1 x 2.3 cm.

Vascular: The above-described contrast-enhancing mass extends into
the left cavernous sinus. The left internal carotid artery flow void
is preserved.

Skull and upper cervical spine: Normal calvarium and skull base.
Visualized upper cervical spine and soft tissues are normal.

Sinuses/Orbits:No paranasal sinus fluid levels or advanced mucosal
thickening. No mastoid or middle ear effusion. Normal orbits.
IMPRESSION: 1. No acute intracranial abnormality.
2. Left paraclinoid meningioma with vasogenic edema in the left
anterior temporal lobe. There is likely invasion of the left
cavernous sinus.

## 2020-09-14 IMAGING — MR MR MRA HEAD W/O CM
1 series · 20 of 48 positions shown · non-contrast
Comparison: No pertinent prior exam.

CLINICAL DATA: Encephalopathy

EXAM:
MRA HEAD WITHOUT CONTRAST
TECHNIQUE: Angiographic images of the Circle of Willis were acquired using MRA
technique without intravenous contrast.

[Series 9: TOF · axial · 0.5mm · 0.41mm/px · z∈[-131,-16]mm · 20 of 248 slices shown]
[im 1/248]
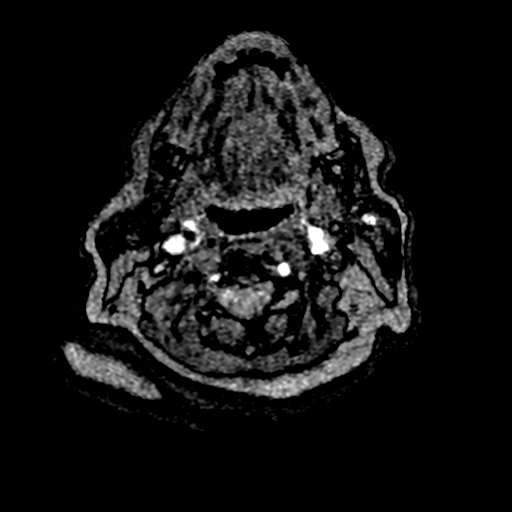
[im 6/248]
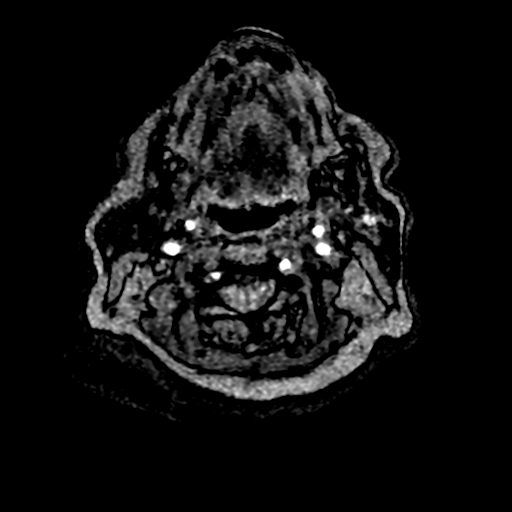
[im 11/248]
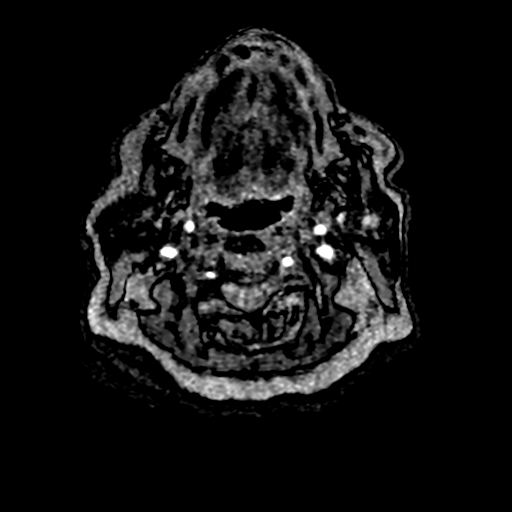
[im 16/248]
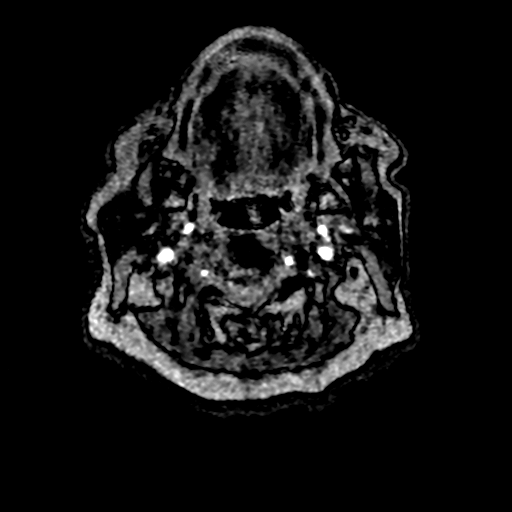
[im 22/248]
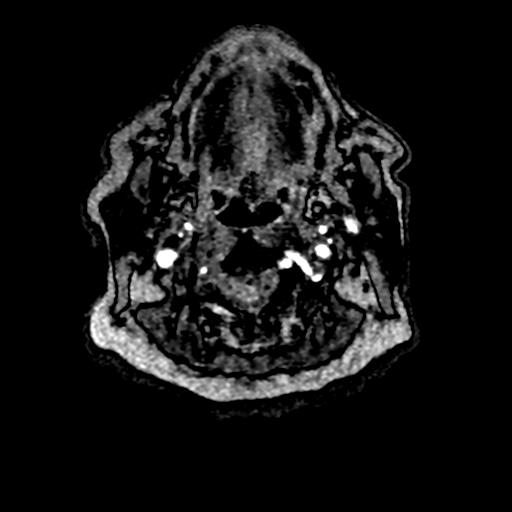
[im 27/248]
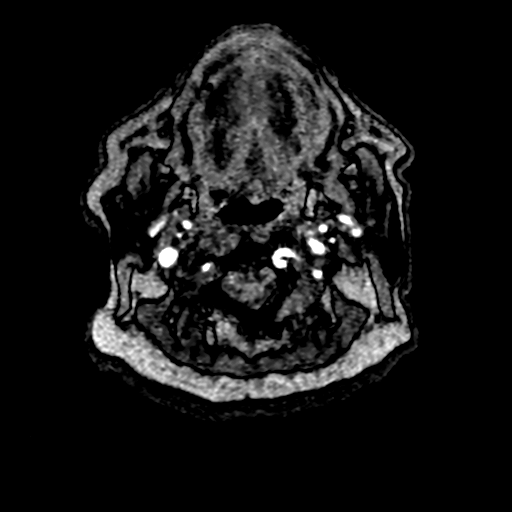
[im 32/248]
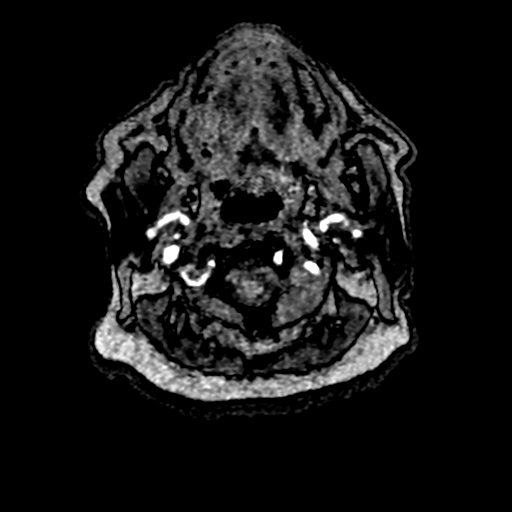
[im 37/248]
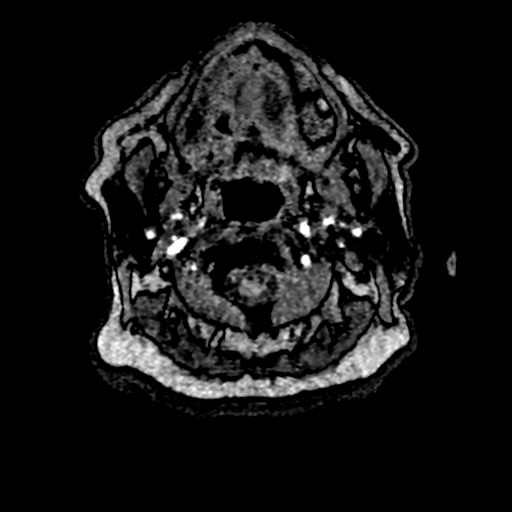
[im 43/248]
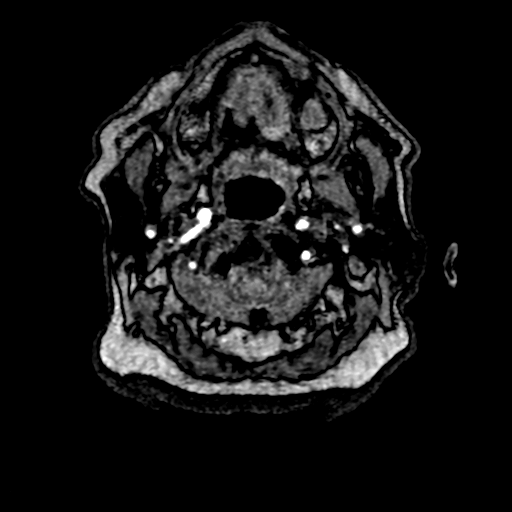
[im 48/248]
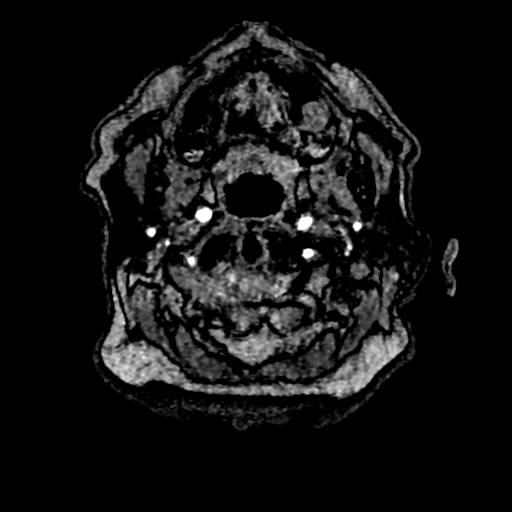
[im 53/248]
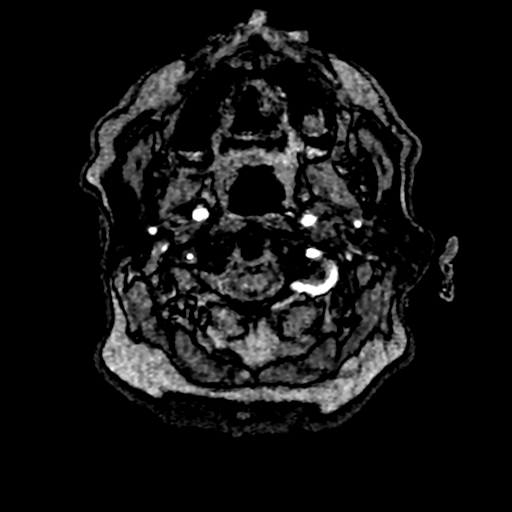
[im 58/248]
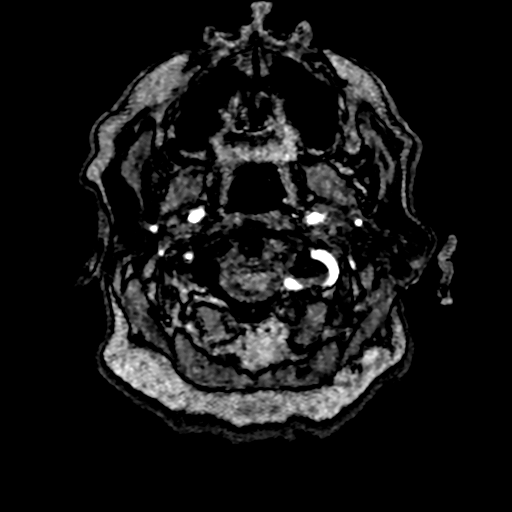
[im 79/248]
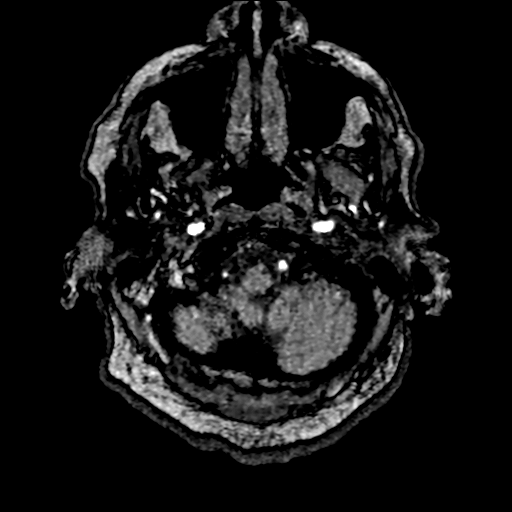
[im 111/248]
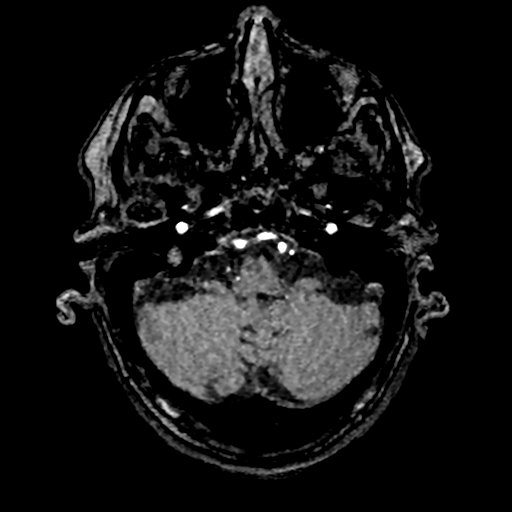
[im 127/248]
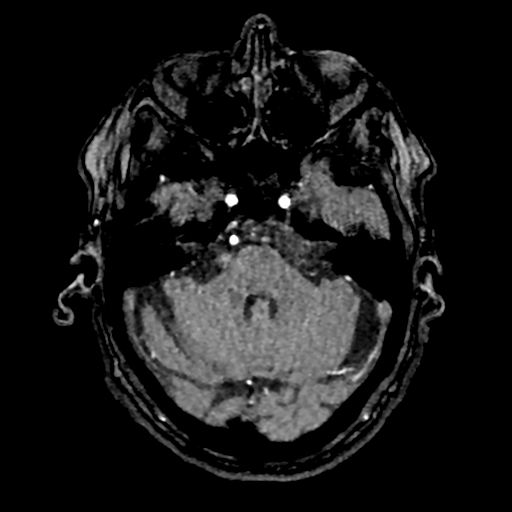
[im 142/248]
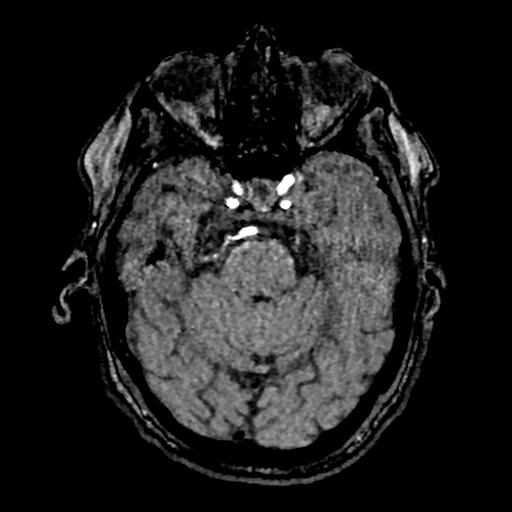
[im 174/248]
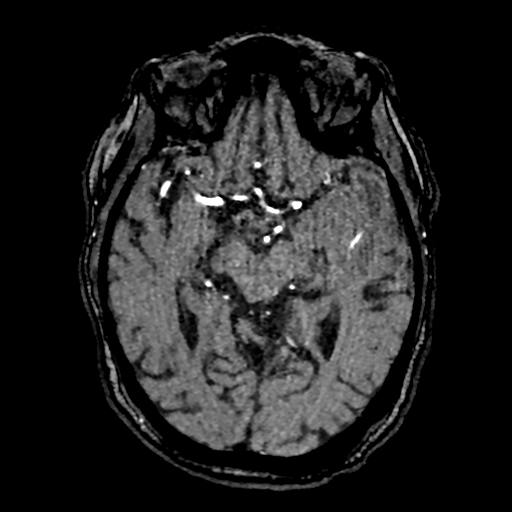
[im 205/248]
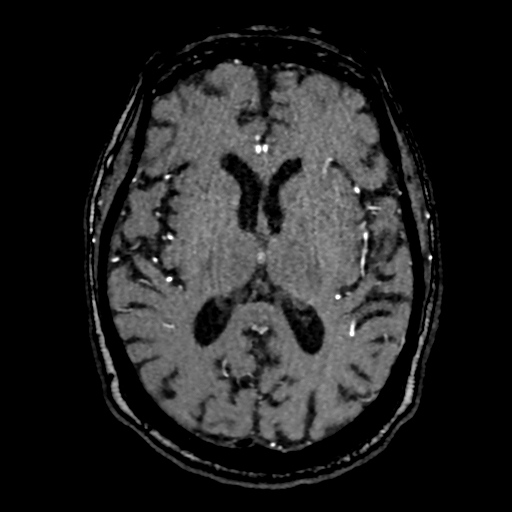
[im 211/248]
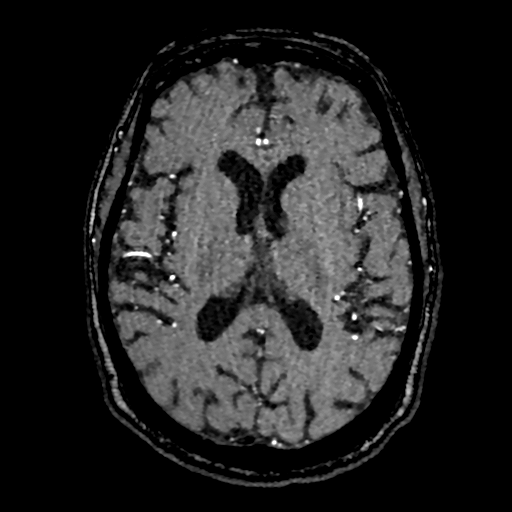
[im 237/248]
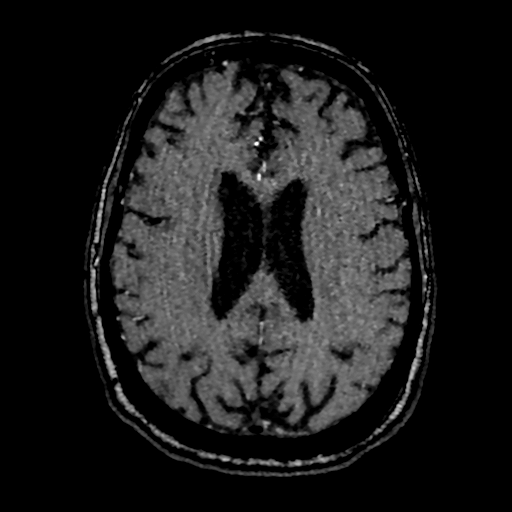

[20 of 48 positions shown; findings below may reference images not displayed]

FINDINGS: POSTERIOR CIRCULATION:

--Vertebral arteries: Normal

--Inferior cerebellar arteries: Normal.

--Basilar artery: Normal.

--Superior cerebellar arteries: Normal.

--Posterior cerebral arteries: Normal.

ANTERIOR CIRCULATION:

--Intracranial internal carotid arteries: Normal.

--Anterior cerebral arteries (ACA): Normal.

--Middle cerebral arteries (MCA): Normal.

ANATOMIC VARIANTS: Both posterior communicating arteries are patent.

Other: None.
IMPRESSION: Normal intracranial MRA.

## 2020-09-14 MED ORDER — INSULIN ASPART 100 UNIT/ML IJ SOLN
0.0000 [IU] | Freq: Three times a day (TID) | INTRAMUSCULAR | Status: DC
  Administered 2020-09-15: 14:00:00 11 [IU] via SUBCUTANEOUS
  Filled 2020-09-14 (×2): qty 1

## 2020-09-14 MED ORDER — SERTRALINE HCL 50 MG PO TABS
50.0000 mg | ORAL_TABLET | Freq: Every day | ORAL | Status: DC
  Administered 2020-09-15: 50 mg via ORAL
  Filled 2020-09-14: qty 1

## 2020-09-14 MED ORDER — ONDANSETRON HCL 4 MG PO TABS
4.0000 mg | ORAL_TABLET | Freq: Four times a day (QID) | ORAL | Status: DC | PRN
Start: 2020-09-14 — End: 2020-09-15

## 2020-09-14 MED ORDER — DEXAMETHASONE SODIUM PHOSPHATE 4 MG/ML IJ SOLN
4.0000 mg | Freq: Four times a day (QID) | INTRAMUSCULAR | Status: DC
  Administered 2020-09-15: 4 mg via INTRAVENOUS
  Filled 2020-09-14: qty 1

## 2020-09-14 MED ORDER — APIXABAN 5 MG PO TABS
5.0000 mg | ORAL_TABLET | Freq: Two times a day (BID) | ORAL | Status: DC
  Administered 2020-09-15 (×2): 5 mg via ORAL
  Filled 2020-09-14 (×2): qty 1

## 2020-09-14 MED ORDER — INSULIN ASPART 100 UNIT/ML IJ SOLN
0.0000 [IU] | Freq: Every day | INTRAMUSCULAR | Status: DC
  Administered 2020-09-14: 2 [IU] via SUBCUTANEOUS
  Filled 2020-09-14: qty 1

## 2020-09-14 MED ORDER — ACETAMINOPHEN 500 MG PO TABS: 500.0000 mg | ORAL_TABLET | Freq: Four times a day (QID) | ORAL | Status: DC | PRN

## 2020-09-14 MED ORDER — DEXAMETHASONE SODIUM PHOSPHATE 10 MG/ML IJ SOLN
10.0000 mg | Freq: Once | INTRAMUSCULAR | Status: AC
  Administered 2020-09-14: 10 mg via INTRAVENOUS
  Filled 2020-09-14: qty 1

## 2020-09-14 MED ORDER — INSULIN ASPART 100 UNIT/ML IJ SOLN
0.0000 [IU] | Freq: Three times a day (TID) | INTRAMUSCULAR | Status: DC
  Administered 2020-09-15: 11 [IU] via SUBCUTANEOUS

## 2020-09-14 MED ORDER — LORAZEPAM 1 MG PO TABS
1.0000 mg | ORAL_TABLET | Freq: Every evening | ORAL | Status: DC | PRN
  Administered 2020-09-15: 01:00:00 1 mg via ORAL
  Filled 2020-09-14: qty 1

## 2020-09-14 MED ORDER — ONDANSETRON HCL 4 MG/2ML IJ SOLN: 4.0000 mg | Freq: Four times a day (QID) | INTRAMUSCULAR | Status: DC | PRN

## 2020-09-14 MED ORDER — RAMIPRIL 5 MG PO CAPS
15.0000 mg | ORAL_CAPSULE | Freq: Every day | ORAL | Status: DC
  Administered 2020-09-15: 09:00:00 15 mg via ORAL
  Filled 2020-09-14: qty 1

## 2020-09-14 MED ORDER — SODIUM CHLORIDE 0.9 % IV SOLN: INTRAVENOUS | Status: DC

## 2020-09-14 MED ORDER — GADOBUTROL 1 MMOL/ML IV SOLN
7.0000 mL | Freq: Once | INTRAVENOUS | Status: AC | PRN
  Administered 2020-09-14: 7.5 mL via INTRAVENOUS

## 2020-09-14 NOTE — ED Notes (Signed)
EDP at bedside  

## 2020-09-14 NOTE — ED Notes (Signed)
Report received from [place], Conservation officer, nature. Patient alert and oriented, warm and dry, and in no acute distress. Patient denies SI, HI, AVH and pain. Patient made aware of Q15 minute rounds and Engineer, drilling presence for their safety. Patient instructed to come to this nurse with needs or concerns.

## 2020-09-14 NOTE — H&P (Addendum)
History and Physical    Daisy Watkins NGE:[id number] DOB: 1932/07/31 DOA: 09/14/2020  PCP: Valerie Roys, DO   Patient coming from: Home  I have personally briefly reviewed patient's old medical records in [place]  Chief Complaint: Change in mental status Most of the history was obtained from her daughter at the bedside  HPI: Daisy Watkins is a 87 y.o. female with medical history significant for anxiety, DVT, diabetes mellitus, GERD, insomnia and hypertension who was brought into the ER after her daughter took out IVC papers on her due to medication noncompliance, poor oral intake and worsening confusion. Her daughter states that the patient recently told her she stopped taking her medications 4 months ago.  She has had poor oral intake and states that she has no appetite.  Family is concerned because she has been speaking to her husband and son who are both dead.  Over the last 1 week she has noticed a progressive decline in her mother's mental status and was worried she may have hyperglycemia since she stopped taking her insulin so she took her IVC papers and brought her mother to the hospital. Patient is sitting up in bed in the ER and denies having any complaints.  She is awake, alert and oriented to person place and time. She denies having any chest pain, no shortness of breath, no dizziness, no lightheadedness, no palpitations, no abdominal pain, no changes in her bowel habits, no urinary symptoms, no fever, no chills, no headache, no blurred vision, no focal deficits. Labs show sodium 135, potassium 3.9, chloride 99, bicarb 25, glucose 286, BUN 14, creatinine 0.76, calcium 9.8, alkaline phosphatase 88, albumin 4.1, AST 20, ALT 22, total protein 7.3, total bilirubin 2.0, white count 6.1, hemoglobin 14.6, hematocrit 42.7, MCV 85.1, RDW 13.2, platelet count 172, Tylenol level less than 10, salicylate level less than 7 CT scan of the head without contrast shows  . 2 cm partially  calcified mass within the left middle cranial fossa, potentially associated with the left cavernous sinus. Findings could be secondary to meningioma, though giant aneurysm is also in the differential. There is edema within the left temporal lobe. Recommend further evaluation with MRI. Atrophy and chronic small vessel ischemic changes of the white matter. MRI of the brain without contrast shows No acute intracranial abnormality. Left paraclinoid meningioma with vasogenic edema in the left anterior temporal lobe. There is likely invasion of the left cavernous sinus.    ED Course: Patient is an 87 year old female who was brought into the ER by her daughter who took out IVC papers on her due to medication noncompliance and worsening mental status changes which her daughter thought was secondary to hyperglycemia from noncompliance. Imaging shows a left paraclinoid meningioma with vasogenic edema. Neurosurgery was consulted in the ER. Patient will be admitted to the hospital for further evaluation.    Review of Systems: As per HPI otherwise all other systems reviewed and negative.    Past Medical History:  Diagnosis Date   Anxiety    Constipation 08/17/2015   Diabetes mellitus without complication ([place])    DVT (deep venous thrombosis) ([place]) 2006   chronic anticoagulation   Encounter for monitoring Coumadin therapy 12/16/2016   GERD (gastroesophageal reflux disease)    Hyperlipidemia    Hypertension    Insomnia    Monitoring for anticoagulant use 09/21/2014   Goal INR 2-3; recurrent DVTs, paroxysmal atrial fibrillation.  Unable to take [facility]   Myalgia 02/23/2017   Sebaceous  cyst 06/11/2015   Skin lesion of back 09/05/2016   Trapezius muscle spasm 05/22/2017   Urinary frequency 11/12/2016   Vitamin B12 deficiency    Vitamin D deficiency disease     Past Surgical History:  Procedure Laterality Date   ABDOMINAL HYSTERECTOMY       reports that she quit smoking about 43 years ago. Her  smoking use included cigarettes. She has a 15.00 pack-year smoking history. She has never used smokeless tobacco. She reports that she does not drink alcohol and does not use drugs.  Allergies  Allergen Reactions   Ciprofloxacin Palpitations    Severe Headache   Clindamycin/Lincomycin Nausea Only    Made her head hurt   Elemental Sulfur Itching   Hctz [Hydrochlorothiazide] Other (See Comments)    Abdominal pain   Doxycycline Hyclate Palpitations    Family History  Problem Relation Age of Onset   Diabetes Maternal Grandmother    Hypertension Mother    Cancer Daughter        breast   Heart disease Son    Heart disease Son       Prior to Admission medications   Medication Sig Start Date End Date Taking? Authorizing Provider  LORazepam (ATIVAN) 1 MG tablet Take 1 mg by mouth at bedtime as needed. 06/05/20  Yes [provider]  acetaminophen (TYLENOL) 500 MG tablet Take 500 mg every 6 (six) hours as needed by mouth. As needed    [provider]  amLODipine (NORVASC) 10 MG tablet Take 1 tablet (10 mg total) by mouth daily. 03/21/20   Johnson, Megan P, DO  ELIQUIS 5 MG TABS tablet TAKE 1 TABLET (5 MG TOTAL) BY MOUTH 2 (TWO) TIMES DAILY. NEED OFFICE VISIT FOR FURTHER REFILLS Patient taking differently: Take 5 mg by mouth 2 (two) times daily. 06/11/20   McElwee, Lauren A, NP  ramipril (ALTACE) 10 MG capsule TAKE 1 TABLET BY MOUTH ONCE DAILY. TAKE WITH 5MG  FOR TOTAL OF 15MG  DAILY Patient taking differently: Take 15 mg by mouth daily. TAKE 1 TABLET BY MOUTH ONCE DAILY. TAKE WITH 5MG  FOR TOTAL OF 15MG  DAILY 09/03/20   Wynetta Emery, Megan P, DO  ramipril (ALTACE) 5 MG capsule TAKE ONE CAPSULE BY MOUTH DAILY WITH 10 MG CAPSULE FOR A TOTAL OF 15 MG DAILY Patient taking differently: Take 15 mg by mouth daily. TAKE ONE CAPSULE BY MOUTH DAILY WITH 10 MG CAPSULE FOR A TOTAL OF 15 MG DAILY 09/03/20   Johnson, Megan P, DO  sertraline (ZOLOFT) 50 MG tablet TAKE 1 TABLET BY MOUTH EVERY  DAY Patient taking differently: Take 50 mg by mouth daily. 06/09/20   Park Liter P, DO  TRESIBA 100 UNIT/ML SOLN INJECT 24 UNITS INTO THE SKIN AT BEDTIME. 07/26/20   Park Liter P, DO  TRUETRACK TEST test strip  12/29/17   [provider]  TRULICITY 1.5 LG/9.2JJ SOPN INJECT 1.5 MG INTO THE SKIN ONCE A WEEK. 08/20/20   Valerie Roys, DO    Physical Exam: Vitals:   09/14/20 1302 09/14/20 1558 09/14/20 1942 09/14/20 2301  BP:  (!) 150/60 (!) 156/65 (!) 162/62  Pulse:  71 (!) 116 61  Resp:  16 18 18   Temp:  98.9 F (37.2 C) 98.2 F (36.8 C) 97.7 F (36.5 C)  TempSrc:  Oral Oral Oral  SpO2:  98% 98% 100%  Weight: 79.4 kg     Height: 5\' 6"  (1.676 m)        Vitals:   09/14/20 1302  09/14/20 1558 09/14/20 1942 09/14/20 2301  BP:  (!) 150/60 (!) 156/65 (!) 162/62  Pulse:  71 (!) 116 61  Resp:  16 18 18   Temp:  98.9 F (37.2 C) 98.2 F (36.8 C) 97.7 F (36.5 C)  TempSrc:  Oral Oral Oral  SpO2:  98% 98% 100%  Weight: 79.4 kg     Height: 5\' 6"  (1.676 m)         Constitutional: Alert and oriented x 3 . Not in any apparent distress HEENT:      Head: Normocephalic and atraumatic.         Eyes: PERLA, EOMI, Conjunctivae are normal. Sclera is non-icteric.       Mouth/Throat: Mucous membranes are moist.       Neck: Supple with no signs of meningismus. Cardiovascular: Regular rate and rhythm. No murmurs, gallops, or rubs. 2+ symmetrical distal pulses are present . No JVD. No LE edema Respiratory: Respiratory effort normal .Lungs sounds clear bilaterally. No wheezes, crackles, or rhonchi.  Gastrointestinal: Soft, non tender, and non distended with positive bowel sounds.  Genitourinary: No CVA tenderness. Musculoskeletal: Nontender with normal range of motion in all extremities. No cyanosis, or erythema of extremities. Neurologic:  Face is symmetric. Moving all extremities. No gross focal neurologic deficits . Skin: Skin is warm, dry.  No rash or ulcers Psychiatric:  Mood and affect are normal    Labs on Admission: I have personally reviewed following labs and imaging studies  CBC: Recent Labs  Lab 09/14/20 1316  WBC 6.1  HGB 14.6  HCT 42.7  MCV 85.1  PLT 517   Basic Metabolic Panel: Recent Labs  Lab 09/14/20 1316  NA 135  K 3.9  CL 99  CO2 25  GLUCOSE 286*  BUN 14  CREATININE 0.76  CALCIUM 9.8   GFR: Estimated Creatinine Clearance: 53.6 mL/min (by C-G formula based on SCr of 0.76 mg/dL). Liver Function Tests: Recent Labs  Lab 09/14/20 1316  AST 20  ALT 22  ALKPHOS 88  BILITOT 2.0*  PROT 7.3  ALBUMIN 4.1   No results for input(s): LIPASE, AMYLASE in the last 168 hours. No results for input(s): AMMONIA in the last 168 hours. Coagulation Profile: No results for input(s): INR, PROTIME in the last 168 hours. Cardiac Enzymes: No results for input(s): CKTOTAL, CKMB, CKMBINDEX, TROPONINI in the last 168 hours. BNP (last 3 results) No results for input(s): PROBNP in the last 8760 hours. HbA1C: No results for input(s): HGBA1C in the last 72 hours. CBG: Recent Labs  Lab 09/14/20 2253  GLUCAP 253*   Lipid Profile: No results for input(s): CHOL, HDL, LDLCALC, TRIG, CHOLHDL, LDLDIRECT in the last 72 hours. Thyroid Function Tests: No results for input(s): TSH, T4TOTAL, FREET4, T3FREE, THYROIDAB in the last 72 hours. Anemia Panel: No results for input(s): VITAMINB12, FOLATE, FERRITIN, TIBC, IRON, RETICCTPCT in the last 72 hours. Urine analysis:    Component Value Date/Time   COLORURINE AMBER (A) 09/14/2020 1450   APPEARANCEUR HAZY (A) 09/14/2020 1450   APPEARANCEUR Hazy (A) 03/21/2020 1613   LABSPEC 1.026 09/14/2020 1450   LABSPEC 1.024 01/27/2014 1511   PHURINE 5.0 09/14/2020 1450   GLUCOSEU >=500 (A) 09/14/2020 1450   GLUCOSEU >=500 01/27/2014 1511   HGBUR LARGE (A) 09/14/2020 1450   BILIRUBINUR NEGATIVE 09/14/2020 1450   BILIRUBINUR Negative 03/21/2020 1613   BILIRUBINUR Negative 01/27/2014 1511   KETONESUR 80 (A)  09/14/2020 1450   PROTEINUR 30 (A) 09/14/2020 1450   NITRITE NEGATIVE 09/14/2020 1450  LEUKOCYTESUR NEGATIVE 09/14/2020 1450   LEUKOCYTESUR Negative 01/27/2014 1511    Radiological Exams on Admission: CT Head Wo Contrast  Result Date: 09/14/2020 CLINICAL DATA:  Mental status change EXAM: CT HEAD WITHOUT CONTRAST TECHNIQUE: Contiguous axial images were obtained from the base of the skull through the vertex without intravenous contrast. COMPARISON:  None. FINDINGS: Brain: No acute territorial infarction or hemorrhage is visualized. Moderate atrophy. Minimal chronic small vessel ischemic change of the white matter. Low-density edema within the left temporal lobe. 2 x 2 cm partially calcified mass, probably extra-axial and abutting the medial temporal lobe. This appears to be associated with left cavernous sinus, series 3, image 6. Vascular: No hyperdense vessels.  Carotid vascular calcification Skull: Normal. Negative for fracture or focal lesion. Sinuses/Orbits: No acute finding. Other: None IMPRESSION: 1. 2 cm partially calcified mass within the left middle cranial fossa, potentially associated with the left cavernous sinus. Findings could be secondary to meningioma, though giant aneurysm is also in the differential. There is edema within the left temporal lobe. Recommend further evaluation with MRI. 2. Atrophy and chronic small vessel ischemic changes of the white matter. Electronically Signed   By: Donavan Foil M.D.   On: 09/14/2020 16:04   MR ANGIO HEAD WO CONTRAST  Result Date: 09/14/2020 CLINICAL DATA:  Encephalopathy EXAM: MRA HEAD WITHOUT CONTRAST TECHNIQUE: Angiographic images of the Circle of Willis were acquired using MRA technique without intravenous contrast. COMPARISON:  No pertinent prior exam. FINDINGS: POSTERIOR CIRCULATION: --Vertebral arteries: Normal --Inferior cerebellar arteries: Normal. --Basilar artery: Normal. --Superior cerebellar arteries: Normal. --Posterior cerebral  arteries: Normal. ANTERIOR CIRCULATION: --Intracranial internal carotid arteries: Normal. --Anterior cerebral arteries (ACA): Normal. --Middle cerebral arteries (MCA): Normal. ANATOMIC VARIANTS: Both posterior communicating arteries are patent. Other: None. IMPRESSION: Normal intracranial MRA. Electronically Signed   By: Ulyses Jarred M.D.   On: 09/14/2020 22:28   MR Brain W and Wo Contrast  Result Date: 09/14/2020 CLINICAL DATA:  Altered mental status EXAM: MRI HEAD WITHOUT AND WITH CONTRAST TECHNIQUE: Multiplanar, multiecho pulse sequences of the brain and surrounding structures were obtained without and with intravenous contrast. CONTRAST:  7.4mL GADAVIST GADOBUTROL 1 MMOL/ML IV SOLN COMPARISON:  None. FINDINGS: Brain: No acute infarct, mass effect or extra-axial collection. No acute or chronic hemorrhage. Hyperintense T2-weighted signal within the anterior left temporal lobe. The midline structures are normal. There is a contrast-enhancing mass in the left paraclinoid region, which is dural based. It measures 3.1 x 2.3 cm. Vascular: The above-described contrast-enhancing mass extends into the left cavernous sinus. The left internal carotid artery flow void is preserved. Skull and upper cervical spine: Normal calvarium and skull base. Visualized upper cervical spine and soft tissues are normal. Sinuses/Orbits:No paranasal sinus fluid levels or advanced mucosal thickening. No mastoid or middle ear effusion. Normal orbits. IMPRESSION: 1. No acute intracranial abnormality. 2. Left paraclinoid meningioma with vasogenic edema in the left anterior temporal lobe. There is likely invasion of the left cavernous sinus. Electronically Signed   By: Ulyses Jarred M.D.   On: 09/14/2020 22:25     Assessment/Plan Principal Problem:   AMS (altered mental status) Active Problems:   Type 2 diabetes mellitus with hyperglycemia (HCC)   Benign essential hypertension   Paroxysmal A-fib (HCC)   Major depression   History  of DVT (deep vein thrombosis)   Meningioma (HCC)     Altered mental status Unclear etiology Rule out possible delirium versus dementia Patient has been talking to dead relatives, refusing oral intake and discontinued all her  medications She has a history of depression and her symptoms may be related to same Continue Zoloft Imaging shows meningioma with vasogenic edema Continue Decadron Awaiting neurosurgery consult Patient remains involuntarily committed and will be reassessed by psychiatry in a.m. Continue one-to-one safety sitter   Diabetes mellitus Patient has been noncompliant with prescribed insulin Maintain consistent carbohydrate diet Glycemic control with sliding scale insulin    History of DVT/paroxysmal atrial fibrillation Continue apixaban   Hypertension Continue ramipril    Meningioma Found on CT scan of the head without contrast with associated vasogenic edema Continue Decadron Neurosurgery consult     DVT prophylaxis: Lovenox  Code Status: full code  Family Communication: Greater than 50% of time was spent discussing patient's condition and plan of care with her daughter at the bedside.  All questions and concerns have been addressed.  She verbalizes understanding and agrees with the plan.  CODE STATUS was discussed and patient is a full code Disposition Plan: Back to previous home environment Consults called: Neurosurgery/psychiatry Status: At the time of admission, it appears that the appropriate admission status for this patient is inpatient. This is judged to be reasonable and necessary in order to provide the required intensity of service to ensure the patient's safety given the presenting symptoms, physical exam findings and initial radiographic and laboratory data in the context of their comorbid conditions.  Patient requires inpatient status due to high intensity of service, high risk for further deterioration and high frequency of surveillance  required.    Collier Bullock MD [facility]     09/15/2020, 12:07 AM

## 2020-09-14 NOTE — ED Notes (Signed)
Pt sitting quietly in bed with daughter at bedside. No complaints

## 2020-09-14 NOTE — ED Notes (Signed)
Hourly rounding performed, patient currently awake in hallway bed. Patient has no complaints at this time. Q15 minute rounds and monitoring via Engineer, drilling to continue.

## 2020-09-14 NOTE — ED Notes (Signed)
First Nurse Note: Pt to ED via [facility] under IVC.

## 2020-09-14 NOTE — ED Notes (Signed)
IVC   SEEN  BY  JAMIE  LORD NP  PENDING  PLACEMENT

## 2020-09-14 NOTE — ED Provider Notes (Signed)
[facility] Emergency Department Provider Note    Event Date/Time   First MD Initiated Contact with Patient 09/14/20 1458     (approximate)  I have reviewed the triage vital signs and the nursing notes.   HISTORY  Chief Complaint Memory Loss    HPI Daisy Watkins is a 87 y.o. female who presents to the ER for evaluation of several months of progressive confusion and forgetfulness as well as over the past several weeks starting to hear voices and becoming more paranoid and anxious.  According the daughter who is at bedside with her she is now refusing to take her medications most notably her insulin her blood sugars been running high in the 400s.  States that she is been talking to her son and husband both of which are deceased.  No report of any violent behavior.  Refusing to go see her doctor.  Past Medical History:  Diagnosis Date   Anxiety    Constipation 08/17/2015   Diabetes mellitus without complication ([place])    DVT (deep venous thrombosis) ([place]) 2006   chronic anticoagulation   Encounter for monitoring Coumadin therapy 12/16/2016   GERD (gastroesophageal reflux disease)    Hyperlipidemia    Hypertension    Insomnia    Monitoring for anticoagulant use 09/21/2014   Goal INR 2-3; recurrent DVTs, paroxysmal atrial fibrillation.  Unable to take Xaralto   Myalgia 02/23/2017   Sebaceous cyst 06/11/2015   Skin lesion of back 09/05/2016   Trapezius muscle spasm 05/22/2017   Urinary frequency 11/12/2016   Vitamin B12 deficiency    Vitamin D deficiency disease    Family History  Problem Relation Age of Onset   Diabetes Maternal Grandmother    Hypertension Mother    Cancer Daughter        breast   Heart disease Son    Heart disease Son    Past Surgical History:  Procedure Laterality Date   ABDOMINAL HYSTERECTOMY     Patient Active Problem List   Diagnosis Date Noted   Psychosis in elderly with behavioral disturbance ([place]) 09/14/2020   Shoulder  impingement 05/01/2017   Insomnia 11/12/2016   DNR (do not resuscitate) 09/30/2016   Advanced care planning/counseling discussion 09/30/2016   Allergic rhinitis 11/23/2015   Major depression 09/19/2015   Anxiety, generalized 11/23/2014   Aortic atherosclerosis ([place]) 09/28/2014   Coronary artery disease 09/28/2014   Fatty liver 09/28/2014   Paroxysmal A-fib ([place]) 09/28/2014   Benign essential hypertension 09/26/2014   GERD (gastroesophageal reflux disease) 09/26/2014   History of DVT (deep vein thrombosis) 09/26/2014   Type 2 diabetes mellitus with hyperglycemia ([place]) 09/21/2014   Hyperlipidemia, mixed 09/21/2014   Postzoster neuralgia 09/21/2014      Prior to Admission medications   Medication Sig Start Date End Date Taking? Authorizing Provider  acetaminophen (TYLENOL) 500 MG tablet Take 500 mg every 6 (six) hours as needed by mouth. As needed    [provider]  amLODipine (NORVASC) 10 MG tablet Take 1 tablet (10 mg total) by mouth daily. 03/21/20   Park Liter P, DO  aspirin EC 81 MG tablet Take 81 mg by mouth daily.    [provider]  azelastine (OPTIVAR) 0.05 % ophthalmic solution Place 1 drop into both eyes 2 (two) times daily. Patient not taking: Reported on 03/21/2020 10/08/18   Volney American, PA-C  Cholecalciferol (VITAMIN D3) 1000 units CAPS Take by mouth. Daily    [provider]  HUDJSHF 0  MG TABS tablet TAKE 1 TABLET (5 MG TOTAL) BY MOUTH 2 (TWO) TIMES DAILY. NEED OFFICE VISIT FOR FURTHER REFILLS 06/11/20   McElwee, Lauren A, NP  fluticasone (FLONASE) 50 MCG/ACT nasal spray Place 2 sprays into both nostrils daily. 07/21/19   Park Liter P, DO  magnesium oxide (MAG-OX) 400 MG tablet Take by mouth daily.     [provider]  meloxicam (MOBIC) 15 MG tablet Take 1 tablet (15 mg total) by mouth daily. Patient not taking: Reported on 03/21/2020 01/21/19   Park Liter P, DO  nystatin (MYCOSTATIN/NYSTOP) powder Apply 1  application topically 3 (three) times daily. 03/21/20   Johnson, Megan P, DO  omeprazole (PRILOSEC) 40 MG capsule Take 1 capsule (40 mg total) by mouth daily. 07/21/19   Johnson, Megan P, DO  ramipril (ALTACE) 10 MG capsule TAKE 1 TABLET BY MOUTH ONCE DAILY. TAKE WITH 5MG  FOR TOTAL OF 15MG  DAILY 09/03/20   Johnson, Megan P, DO  ramipril (ALTACE) 5 MG capsule TAKE ONE CAPSULE BY MOUTH DAILY WITH 10 MG CAPSULE FOR A TOTAL OF 15 MG DAILY 09/03/20   Johnson, Megan P, DO  sertraline (ZOLOFT) 50 MG tablet TAKE 1 TABLET BY MOUTH EVERY DAY 06/09/20   Johnson, Megan P, DO  TRESIBA 100 UNIT/ML SOLN INJECT 24 UNITS INTO THE SKIN AT BEDTIME. 07/26/20   Johnson, Megan P, DO  TRUETRACK TEST test strip  12/29/17   [provider]  TRULICITY 1.5 IE/3.3IR SOPN INJECT 1.5 MG INTO THE SKIN ONCE A WEEK. 08/20/20   Johnson, Megan P, DO  vitamin C (ASCORBIC ACID) 500 MG tablet Take 500 mg by mouth daily.    [provider]    Allergies Ciprofloxacin, Clindamycin/lincomycin, Elemental sulfur, Hctz [hydrochlorothiazide], and Doxycycline hyclate    Social History Social History   Tobacco Use   Smoking status: Former    Packs/day: 0.50    Years: 30.00    Pack years: 15.00    Types: Cigarettes    Quit date: 03/31/1977    Years since quitting: 43.4   Smokeless tobacco: Never  Vaping Use   Vaping Use: Never used  Substance Use Topics   Alcohol use: No    Alcohol/week: 0.0 standard drinks    Comment: rare   Drug use: No    Review of Systems Patient denies headaches, rhinorrhea, blurry vision, numbness, shortness of breath, chest pain, edema, cough, abdominal pain, nausea, vomiting, diarrhea, dysuria, fevers, rashes or hallucinations unless otherwise stated above in HPI. ____________________________________________   PHYSICAL EXAM:  VITAL SIGNS: Vitals:   09/14/20 1558 09/14/20 1942  BP: (!) 150/60 (!) 156/65  Pulse: 71 (!) 116  Resp: 16 18  Temp: 98.9 F (37.2 C) 98.2 F (36.8 C)  SpO2:  98% 98%    Constitutional: Alert in NAD Eyes: Conjunctivae are normal.  Head: Atraumatic. Nose: No congestion/rhinnorhea. Mouth/Throat: Mucous membranes are moist.   Neck: No stridor. Painless ROM.  Cardiovascular: Normal rate, regular rhythm. Grossly normal heart sounds.  Good peripheral circulation. Respiratory: Normal respiratory effort.  No retractions. Lungs CTAB. Gastrointestinal: Soft and nontender. No distention. No abdominal bruits. No CVA tenderness. Genitourinary:  Musculoskeletal: No lower extremity tenderness nor edema.  No joint effusions. Neurologic:  Normal speech and language. No gross focal neurologic deficits are appreciated. No facial droop Skin:  Skin is warm, dry and intact. No rash noted. Psychiatric: calm and cooperative at this time  ____________________________________________   LABS (all labs ordered are listed, but only abnormal results are displayed)  Results for orders placed or performed during the hospital encounter of 09/14/20 (from the past 24 hour(s))  Comprehensive metabolic panel     Status: Abnormal   Collection Time: 09/14/20  1:16 PM  Result Value Ref Range   Sodium 135 135 - 145 mmol/L   Potassium 3.9 3.5 - 5.1 mmol/L   Chloride 99 98 - 111 mmol/L   CO2 25 22 - 32 mmol/L   Glucose, Bld 286 (H) 70 - 99 mg/dL   BUN 14 8 - 23 mg/dL   Creatinine, Ser 0.76 0.44 - 1.00 mg/dL   Calcium 9.8 8.9 - 10.3 mg/dL   Total Protein 7.3 6.5 - 8.1 g/dL   Albumin 4.1 3.5 - 5.0 g/dL   AST 20 15 - 41 U/L   ALT 22 0 - 44 U/L   Alkaline Phosphatase 88 38 - 126 U/L   Total Bilirubin 2.0 (H) 0.3 - 1.2 mg/dL   GFR, Estimated >60 >60 mL/min   Anion gap 11 5 - 15  Ethanol     Status: None   Collection Time: 09/14/20  1:16 PM  Result Value Ref Range   Alcohol, Ethyl (B) <18 <56 mg/dL  Salicylate level     Status: Abnormal   Collection Time: 09/14/20  1:16 PM  Result Value Ref Range   Salicylate Lvl <3.1 (L) 7.0 - 30.0 mg/dL  Acetaminophen level      Status: Abnormal   Collection Time: 09/14/20  1:16 PM  Result Value Ref Range   Acetaminophen (Tylenol), Serum <10 (L) 10 - 30 ug/mL  cbc     Status: None   Collection Time: 09/14/20  1:16 PM  Result Value Ref Range   WBC 6.1 4.0 - 10.5 K/uL   RBC 5.02 3.87 - 5.11 MIL/uL   Hemoglobin 14.6 12.0 - 15.0 g/dL   HCT 42.7 36.0 - 46.0 %   MCV 85.1 80.0 - 100.0 fL   MCH 29.1 26.0 - 34.0 pg   MCHC 34.2 30.0 - 36.0 g/dL   RDW 13.2 11.5 - 15.5 %   Platelets 172 150 - 400 K/uL   nRBC 0.0 0.0 - 0.2 %  Urine Drug Screen, Qualitative     Status: None   Collection Time: 09/14/20  2:50 PM  Result Value Ref Range   Tricyclic, Ur Screen NONE DETECTED NONE DETECTED   Amphetamines, Ur Screen NONE DETECTED NONE DETECTED   MDMA (Ecstasy)Ur Screen NONE DETECTED NONE DETECTED   Cocaine Metabolite,Ur [place] NONE DETECTED NONE DETECTED   Opiate, Ur Screen NONE DETECTED NONE DETECTED   Phencyclidine (PCP) Ur S NONE DETECTED NONE DETECTED   Cannabinoid 50 Ng, Ur [place] NONE DETECTED NONE DETECTED   Barbiturates, Ur Screen NONE DETECTED NONE DETECTED   Benzodiazepine, Ur Scrn NONE DETECTED NONE DETECTED   Methadone Scn, Ur NONE DETECTED NONE DETECTED  Urinalysis, Routine w reflex microscopic Urine, Clean Catch     Status: Abnormal   Collection Time: 09/14/20  2:50 PM  Result Value Ref Range   Color, Urine AMBER (A) YELLOW   APPearance HAZY (A) CLEAR   Specific Gravity, Urine 1.026 1.005 - 1.030   pH 5.0 5.0 - 8.0   Glucose, UA >=500 (A) NEGATIVE mg/dL   Hgb urine dipstick LARGE (A) NEGATIVE   Bilirubin Urine NEGATIVE NEGATIVE   Ketones, ur 80 (A) NEGATIVE mg/dL   Protein, ur 30 (A) NEGATIVE mg/dL   Nitrite NEGATIVE NEGATIVE   Leukocytes,Ua NEGATIVE NEGATIVE   RBC / HPF 0-5 0 -  5 RBC/hpf   WBC, UA 6-10 0 - 5 WBC/hpf   Bacteria, UA RARE (A) NONE SEEN   Squamous Epithelial / LPF 11-20 0 - 5   Mucus PRESENT    Hyaline Casts, UA PRESENT     ____________________________________________ ____________________________________________  RADIOLOGY  I personally reviewed all radiographic images ordered to evaluate for the above acute complaints and reviewed radiology reports and findings.  These findings were personally discussed with the patient.  Please see medical record for radiology report.  ____________________________________________   PROCEDURES  Procedure(s) performed:  .Critical Care  Date/Time: 09/14/2020 10:47 PM Performed by: Merlyn Lot, MD Authorized by: Merlyn Lot, MD   Critical care provider statement:    Critical care time (minutes):  15   Critical care time was exclusive of:  Separately billable procedures and treating other patients   Critical care was necessary to treat or prevent imminent or life-threatening deterioration of the following conditions:  CNS failure or compromise   Critical care was time spent personally by me on the following activities:  Development of treatment plan with patient or surrogate, discussions with consultants, evaluation of patient's response to treatment, examination of patient, obtaining history from patient or surrogate, ordering and performing treatments and interventions, ordering and review of laboratory studies, ordering and review of radiographic studies, pulse oximetry, re-evaluation of patient's condition and review of old charts    Critical Care performed: no ____________________________________________   INITIAL IMPRESSION / [place] / ED COURSE  Pertinent labs & imaging results that were available during my care of the patient were reviewed by me and considered in my medical decision making (see chart for details).   DDX: Dehydration, sepsis, pna, uti, hypoglycemia, cva, drug effect, withdrawal, encephalitis   AKASIA Daisy Watkins is a 87 y.o. who presents to the ED with presentation as described above.  Patient presenting with increasing  confusion for the more anxious and paranoid and hearing voices.  Patient IVC by daughter where she is refusing medical care.  We will continue IVC.  Blood work is reassuring mild hyperglycemia but no acidemia.  CT imaging will be ordered to evaluate for structural lesion or CVA.  Will consult psych as well as social work.  Clinical Course as of 09/14/20 2247  Fri Sep 14, 2020  1631 CT imaging showing concern for possible mass lesion.  Will order MRI. [PR]  2244 RI with evidence of meningioma with vasogenic edema.  Discussed case in consultation with Dr. Lacinda Axon of neurosurgery who agrees to consult on patient in the morning.  Based on her age her reluctance to take any medication and increasing confusion will give Decadron as well as placed on sliding scale insulin discussed with hospitalist for admission given acute confusion for further medical management. [PR]    Clinical Course User Index [PR] Merlyn Lot, MD    The patient was evaluated in Emergency Department today for the symptoms described in the history of present illness. He/she was evaluated in the context of the global COVID-19 pandemic, which necessitated consideration that the patient might be at risk for infection with the SARS-CoV-2 virus that causes COVID-19. Institutional protocols and algorithms that pertain to the evaluation of patients at risk for COVID-19 are in a state of rapid change based on information released by regulatory bodies including the CDC and federal and state organizations. These policies and algorithms were followed during the patient's care in the ED.  As part of my medical decision making, I reviewed the following data  within the [place] [facility] notes reviewed and incorporated, Labs reviewed, notes from prior ED visits and [place] Controlled Substance Database   ____________________________________________   FINAL CLINICAL IMPRESSION(S) / ED DIAGNOSES  Final diagnoses:  Memory loss   Auditory hallucination  Meningioma ([place])      NEW MEDICATIONS STARTED DURING THIS VISIT:  New Prescriptions   No medications on file     Note:  This document was prepared using Dragon voice recognition software and may include unintentional dictation errors.    Merlyn Lot, MD 09/14/20 [id number]

## 2020-09-14 NOTE — ED Notes (Signed)
Reports unsure why here, "they say I'm crazy"

## 2020-09-14 NOTE — ED Triage Notes (Signed)
Pt arrived under IVC taken out by family due to patient not willing to take DM insulin medication since April and having memory impairment increase as well as not being able to carry on conversation. Per family with pt, she lives with daughter and has not seen PCP or taken medication since April. Pt is alert to self only and reverts to current year as 1980. Pt is calm and cooperative in triage.

## 2020-09-14 NOTE — Telephone Encounter (Signed)
Tried to call patient's daughter, phone went to voicemail that was not set up. Will try again.

## 2020-09-14 NOTE — ED Notes (Signed)
Pt at MRI

## 2020-09-14 NOTE — ED Notes (Signed)
Hourly rounding performed, patient currently in MRI. Patient has no complaints at this time. Q15 minute rounds and monitoring via Engineer, drilling to continue.

## 2020-09-14 NOTE — Telephone Encounter (Signed)
Patient is at the ER. Please give daughter information about health care power of attorney from below.

## 2020-09-14 NOTE — Consult Note (Signed)
[facility] Consult   Reason for Consult:  paranoia per the family and talking to her deceased son and husband Referring Physician:  EDP Patient Identification: Daisy Watkins MRN:  [id number] Principal Diagnosis: Noncompliance with medications Diagnosis:  Active Problems:   Psychosis in elderly with behavioral disturbance ([place])  Total Time spent with patient: 45 minutes  Subjective:   Daisy Watkins is a 87 y.o. female patient admitted with paranoia, talking to her deceased son and husband.  She is not taking her medications as "My doctor said the first thing to go is the medication (diabetes meds)."  She reports that he is not taking any medications but "my lorazepam".  However, the last time she received this medication was in March.  Evidently, she has not been taking any of her medications.  She states she has anxiety but "only when stressed."  Reports her sleep is "terrible" and the lorazepam helps.  Appetite is decreased.  Her family reports that she is paranoid at times and talks to her deceased son and husband.  No major life changes.  Caveat:  History of UTIs, awaiting results from her U/A.  Her CT scan is showing a mass that will be better explained with her MRI results, awaiting results.  Based on these considerations, no medication recommendations until results are final.  HPI:  87 yo female presents with  Past Psychiatric History: anxiety  Risk to Self:  none Risk to Others:  none Prior Inpatient Therapy:  none Prior Outpatient Therapy:  none  Past Medical History:  Past Medical History:  Diagnosis Date   Anxiety    Constipation 08/17/2015   Diabetes mellitus without complication ([place])    DVT (deep venous thrombosis) ([place]) 2006   chronic anticoagulation   Encounter for monitoring Coumadin therapy 12/16/2016   GERD (gastroesophageal reflux disease)    Hyperlipidemia    Hypertension    Insomnia    Monitoring for anticoagulant use 09/21/2014   Goal INR 2-3;  recurrent DVTs, paroxysmal atrial fibrillation.  Unable to take Xaralto   Myalgia 02/23/2017   Sebaceous cyst 06/11/2015   Skin lesion of back 09/05/2016   Trapezius muscle spasm 05/22/2017   Urinary frequency 11/12/2016   Vitamin B12 deficiency    Vitamin D deficiency disease     Past Surgical History:  Procedure Laterality Date   ABDOMINAL HYSTERECTOMY     Family History:  Family History  Problem Relation Age of Onset   Diabetes Maternal Grandmother    Hypertension Mother    Cancer Daughter        breast   Heart disease Son    Heart disease Son    Family Psychiatric  History: none Social History:  Social History   Substance and Sexual Activity  Alcohol Use No   Alcohol/week: 0.0 standard drinks   Comment: rare     Social History   Substance and Sexual Activity  Drug Use No    Social History   Socioeconomic History   Marital status: Divorced    Spouse name: Not on file   Number of children: Not on file   Years of education: Not on file   Highest education level: GED or equivalent  Occupational History   Not on file  Tobacco Use   Smoking status: Former    Packs/day: 0.50    Years: 30.00    Pack years: 15.00    Types: Cigarettes    Quit date: 03/31/1977    Years since quitting: 75.4  Smokeless tobacco: Never  Vaping Use   Vaping Use: Never used  Substance and Sexual Activity   Alcohol use: No    Alcohol/week: 0.0 standard drinks    Comment: rare   Drug use: No   Sexual activity: Not Currently  Other Topics Concern   Not on file  Social History Narrative   Not on file   Social Determinants of Health   Financial Resource Strain: Medium Risk   Difficulty of Paying Living Expenses: Somewhat hard  Food Insecurity: No Food Insecurity   Worried About Running Out of Food in the Last Year: Never true   Ran Out of Food in the Last Year: Never true  Transportation Needs: No Transportation Needs   Lack of Transportation (Medical): No   Lack of  Transportation (Non-Medical): No  Physical Activity: Insufficiently Active   Days of Exercise per Week: 2 days   Minutes of Exercise per Session: 20 min  Stress: Stress Concern Present   Feeling of Stress : To some extent  Social Connections: Not on file   Additional Social History:    Allergies:   Allergies  Allergen Reactions   Ciprofloxacin Palpitations    Severe Headache   Clindamycin/Lincomycin Nausea Only    Made her head hurt   Elemental Sulfur Itching   Hctz [Hydrochlorothiazide] Other (See Comments)    Abdominal pain   Doxycycline Hyclate Palpitations    Labs:  Results for orders placed or performed during the hospital encounter of 09/14/20 (from the past 48 hour(s))  Comprehensive metabolic panel     Status: Abnormal   Collection Time: 09/14/20  1:16 PM  Result Value Ref Range   Sodium 135 135 - 145 mmol/L   Potassium 3.9 3.5 - 5.1 mmol/L   Chloride 99 98 - 111 mmol/L   CO2 25 22 - 32 mmol/L   Glucose, Bld 286 (H) 70 - 99 mg/dL    Comment: Glucose reference range applies only to samples taken after fasting for at least 8 hours.   BUN 14 8 - 23 mg/dL   Creatinine, Ser 0.76 0.44 - 1.00 mg/dL   Calcium 9.8 8.9 - 10.3 mg/dL   Total Protein 7.3 6.5 - 8.1 g/dL   Albumin 4.1 3.5 - 5.0 g/dL   AST 20 15 - 41 U/L   ALT 22 0 - 44 U/L   Alkaline Phosphatase 88 38 - 126 U/L   Total Bilirubin 2.0 (H) 0.3 - 1.2 mg/dL   GFR, Estimated >60 >60 mL/min    Comment: (NOTE) Calculated using the CKD-EPI Creatinine Equation (2021)    Anion gap 11 5 - 15    Comment: Performed at [facility], [address] [place], [place] [address]  Ethanol     Status: None   Collection Time: 09/14/20  1:16 PM  Result Value Ref Range   Alcohol, Ethyl (B) <10 <10 mg/dL    Comment: (NOTE) Lowest detectable limit for serum alcohol is 10 mg/dL.  For medical purposes only. Performed at [facility], [address] [place], [place] [address]   Salicylate level      Status: Abnormal   Collection Time: 09/14/20  1:16 PM  Result Value Ref Range   Salicylate Lvl <4.9 (L) 7.0 - 30.0 mg/dL    Comment: Performed at [facility], [address], [place], [place] [address]  Acetaminophen level     Status: Abnormal   Collection Time: 09/14/20  1:16 PM  Result Value Ref Range  Acetaminophen (Tylenol), Serum <10 (L) 10 - 30 ug/mL    Comment: (NOTE) Therapeutic concentrations vary significantly. A range of 10-30 ug/mL  may be an effective concentration for many patients. However, some  are best treated at concentrations outside of this range. Acetaminophen concentrations >150 ug/mL at 4 hours after ingestion  and >50 ug/mL at 12 hours after ingestion are often associated with  toxic reactions.  Performed at [facility], [address] [place], [place] [address]   cbc     Status: None   Collection Time: 09/14/20  1:16 PM  Result Value Ref Range   WBC 6.1 4.0 - 10.5 K/uL   RBC 5.02 3.87 - 5.11 MIL/uL   Hemoglobin 14.6 12.0 - 15.0 g/dL   HCT 42.7 36.0 - 46.0 %   MCV 85.1 80.0 - 100.0 fL   MCH 29.1 26.0 - 34.0 pg   MCHC 34.2 30.0 - 36.0 g/dL   RDW 13.2 11.5 - 15.5 %   Platelets 172 150 - 400 K/uL   nRBC 0.0 0.0 - 0.2 %    Comment: Performed at [facility], [address], [place], [place] [address]  Urine Drug Screen, Qualitative     Status: None   Collection Time: 09/14/20  2:50 PM  Result Value Ref Range   Tricyclic, Ur Screen NONE DETECTED NONE DETECTED   Amphetamines, Ur Screen NONE DETECTED NONE DETECTED   MDMA (Ecstasy)Ur Screen NONE DETECTED NONE DETECTED   Cocaine Metabolite,Ur [place] NONE DETECTED NONE DETECTED   Opiate, Ur Screen NONE DETECTED NONE DETECTED   Phencyclidine (PCP) Ur S NONE DETECTED NONE DETECTED   Cannabinoid 50 Ng, Ur [place] NONE DETECTED NONE DETECTED   Barbiturates, Ur Screen NONE DETECTED NONE DETECTED   Benzodiazepine, Ur Scrn NONE DETECTED NONE DETECTED   Methadone Scn, Ur NONE DETECTED NONE  DETECTED    Comment: (NOTE) Tricyclics + metabolites, urine    Cutoff 1000 ng/mL Amphetamines + metabolites, urine  Cutoff 1000 ng/mL MDMA (Ecstasy), urine              Cutoff 500 ng/mL Cocaine Metabolite, urine          Cutoff 300 ng/mL Opiate + metabolites, urine        Cutoff 300 ng/mL Phencyclidine (PCP), urine         Cutoff 25 ng/mL Cannabinoid, urine                 Cutoff 50 ng/mL Barbiturates + metabolites, urine  Cutoff 200 ng/mL Benzodiazepine, urine              Cutoff 200 ng/mL Methadone, urine                   Cutoff 300 ng/mL  The urine drug screen provides only a preliminary, unconfirmed analytical test result and should not be used for non-medical purposes. Clinical consideration and professional judgment should be applied to any positive drug screen result due to possible interfering substances. A more specific alternate chemical method must be used in order to obtain a confirmed analytical result. Gas chromatography / mass spectrometry (GC/MS) is the preferred confirm atory method. Performed at [facility], [address] [place],  [address]     No current facility-administered medications for this encounter.   Current Outpatient Medications  Medication Sig Dispense Refill   acetaminophen (TYLENOL) 500 MG tablet Take 500 mg every 6 (six) hours as needed by mouth. As needed     amLODipine (  NORVASC) 10 MG tablet Take 1 tablet (10 mg total) by mouth daily. 90 tablet 1   aspirin EC 81 MG tablet Take 81 mg by mouth daily.     azelastine (OPTIVAR) 0.05 % ophthalmic solution Place 1 drop into both eyes 2 (two) times daily. (Patient not taking: Reported on 03/21/2020) 6 mL 12   Cholecalciferol (VITAMIN D3) 1000 units CAPS Take by mouth. Daily     ELIQUIS 5 MG TABS tablet TAKE 1 TABLET (5 MG TOTAL) BY MOUTH 2 (TWO) TIMES DAILY. NEED OFFICE VISIT FOR FURTHER REFILLS 180 tablet 0   fluticasone (FLONASE) 50 MCG/ACT nasal spray Place 2 sprays into both  nostrils daily. 16 g 12   magnesium oxide (MAG-OX) 400 MG tablet Take by mouth daily.      meloxicam (MOBIC) 15 MG tablet Take 1 tablet (15 mg total) by mouth daily. (Patient not taking: Reported on 03/21/2020) 90 tablet 3   nystatin (MYCOSTATIN/NYSTOP) powder Apply 1 application topically 3 (three) times daily. 15 g 0   omeprazole (PRILOSEC) 40 MG capsule Take 1 capsule (40 mg total) by mouth daily. 90 capsule 1   ramipril (ALTACE) 10 MG capsule TAKE 1 TABLET BY MOUTH ONCE DAILY. TAKE WITH 5MG  FOR TOTAL OF 15MG  DAILY 90 capsule 1   ramipril (ALTACE) 5 MG capsule TAKE ONE CAPSULE BY MOUTH DAILY WITH 10 MG CAPSULE FOR A TOTAL OF 15 MG DAILY 90 capsule 1   sertraline (ZOLOFT) 50 MG tablet TAKE 1 TABLET BY MOUTH EVERY DAY 90 tablet 1   TRESIBA 100 UNIT/ML SOLN INJECT 24 UNITS INTO THE SKIN AT BEDTIME. 10 mL 2   TRUETRACK TEST test strip   99   TRULICITY 1.5 DG/6.4QI SOPN INJECT 1.5 MG INTO THE SKIN ONCE A WEEK. 2 mL 3   vitamin C (ASCORBIC ACID) 500 MG tablet Take 500 mg by mouth daily.      Musculoskeletal: Strength & Muscle Tone: within normal limits Gait & Station: normal Patient leans: N/A  Psychiatric Specialty Exam: Physical Exam Vitals and nursing note reviewed.  Constitutional:      Appearance: Normal appearance.  HENT:     Head: Normocephalic.     Nose: Nose normal.  Pulmonary:     Effort: Pulmonary effort is normal.  Musculoskeletal:        General: Normal range of motion.     Cervical back: Normal range of motion.  Neurological:     General: No focal deficit present.     Mental Status: She is alert.  Psychiatric:        Attention and Perception: Attention and perception normal.        Mood and Affect: Mood is anxious. Affect is blunt.        Speech: Speech normal.        Behavior: Behavior normal. Behavior is cooperative.        Thought Content: Thought content is paranoid.        Cognition and Memory: Cognition is impaired.        Judgment: Judgment normal.     Review of Systems  Psychiatric/Behavioral:  Positive for memory loss. The patient is nervous/anxious.   All other systems reviewed and are negative.  Blood pressure (!) 150/60, pulse 71, temperature 98.9 F (37.2 C), temperature source Oral, resp. rate 16, height 5\' 6"  (1.676 m), weight 79.4 kg, SpO2 98 %.Body mass index is 28.25 kg/m.  General Appearance: Casual  Eye Contact:  Fair  Speech:  Normal Rate  Volume:  Normal  Mood:  Anxious  Affect:  Blunt  Thought Process:  Coherent  Orientation:  Other:  person and place  Thought Content:  Paranoid Ideation  Suicidal Thoughts:  No  Homicidal Thoughts:  No  Memory:  Immediate;   Fair Recent;   Poor Remote;   Fair  Judgement:  Poor  Insight:  Lacking  Psychomotor Activity:  Normal  Concentration:  Concentration: Fair and Attention Span: Fair  Recall:  [employer] of Knowledge:  Fair  Language:  Good  Akathisia:  No  Handed:  Right  AIMS (if indicated):     Assets:  Housing Leisure Time Resilience Social Support  ADL's:  Intact  Cognition:  Impaired,  Mild  Sleep:        Physical Exam: Physical Exam Vitals and nursing note reviewed.  Constitutional:      Appearance: Normal appearance.  HENT:     Head: Normocephalic.     Nose: Nose normal.  Pulmonary:     Effort: Pulmonary effort is normal.  Musculoskeletal:        General: Normal range of motion.     Cervical back: Normal range of motion.  Neurological:     General: No focal deficit present.     Mental Status: She is alert.  Psychiatric:        Attention and Perception: Attention and perception normal.        Mood and Affect: Mood is anxious. Affect is blunt.        Speech: Speech normal.        Behavior: Behavior normal. Behavior is cooperative.        Thought Content: Thought content is paranoid.        Cognition and Memory: Cognition is impaired.        Judgment: Judgment normal.   Review of Systems  Psychiatric/Behavioral:  Positive for memory loss.  The patient is nervous/anxious.   All other systems reviewed and are negative. Blood pressure (!) 150/60, pulse 71, temperature 98.9 F (37.2 C), temperature source Oral, resp. rate 16, height 5\' 6"  (1.676 m), weight 79.4 kg, SpO2 98 %. Body mass index is 28.25 kg/m.  Treatment Plan Summary: Daily contact with patient to assess and evaluate symptoms and progress in treatment, Medication management, and Plan : Psychosis in elderly with behavioral disturbance: -History of UTIs awaiting results of her U/A -Mass on CT, awaiting MRI results  Disposition: Supportive therapy provided about ongoing stressors.  Waylan Boga, NP 09/14/2020 4:34 PM

## 2020-09-15 ENCOUNTER — Other Ambulatory Visit: Payer: Self-pay

## 2020-09-15 DIAGNOSIS — D329 Benign neoplasm of meninges, unspecified: Secondary | ICD-10-CM | POA: Diagnosis present

## 2020-09-15 DIAGNOSIS — R410 Disorientation, unspecified: Secondary | ICD-10-CM

## 2020-09-15 LAB — BASIC METABOLIC PANEL
Anion gap: 9 (ref 5–15)
BUN: 16 mg/dL (ref 8–23)
CO2: 23 mmol/L (ref 22–32)
Calcium: 9.2 mg/dL (ref 8.9–10.3)
Chloride: 102 mmol/L (ref 98–111)
Creatinine, Ser: 0.55 mg/dL (ref 0.44–1.00)
GFR, Estimated: >60 mL/min (ref >60)
Glucose, Bld: 309 mg/dL — ABNORMAL HIGH (ref 70–99)
Potassium: 3.8 mmol/L (ref 3.5–5.1)
Sodium: 134 mmol/L — ABNORMAL LOW (ref 135–145)

## 2020-09-15 LAB — GLUCOSE, CAPILLARY
Glucose-Capillary: 323 mg/dL — ABNORMAL HIGH (ref 70–99)
Glucose-Capillary: 332 mg/dL — ABNORMAL HIGH (ref 70–99)

## 2020-09-15 LAB — CBC
HCT: 39.4 % (ref 36.0–46.0)
Hemoglobin: 13.6 g/dL (ref 12.0–15.0)
MCH: 28.8 pg (ref 26.0–34.0)
MCHC: 34.5 g/dL (ref 30.0–36.0)
MCV: 83.5 fL (ref 80.0–100.0)
Platelets: 158 10*3/uL (ref 150–400)
RBC: 4.72 MIL/uL (ref 3.87–5.11)
RDW: 13.1 % (ref 11.5–15.5)
WBC: 5.7 10*3/uL (ref 4.0–10.5)
nRBC: 0 % (ref 0.0–0.2)

## 2020-09-15 LAB — RESP PANEL BY RT-PCR (FLU A&B, COVID) ARPGX2
Influenza A by PCR: NEGATIVE
Influenza B by PCR: NEGATIVE
SARS Coronavirus 2 by RT PCR: NEGATIVE

## 2020-09-15 NOTE — Plan of Care (Signed)
End of Shift Summary:  Daisy Watkins remains at bedside. Pt compliant with medication administration and plan of care. Verbalizes "I just want to go home." Ativan x1 to help with sleep. Remained free from falls or injury. Bed low and in locked position.   Problem: Education: Goal: Knowledge of General Education information will improve Description: Including pain rating scale, medication(s)/side effects and non-pharmacologic comfort measures Outcome: Progressing   Problem: Health Behavior/Discharge Planning: Goal: Ability to manage health-related needs will improve Outcome: Progressing   Problem: Clinical Measurements: Goal: Ability to maintain clinical measurements within normal limits will improve Outcome: Progressing Goal: Will remain free from infection Outcome: Progressing Goal: Diagnostic test results will improve Outcome: Progressing Goal: Respiratory complications will improve Outcome: Progressing Goal: Cardiovascular complication will be avoided Outcome: Progressing   Problem: Coping: Goal: Level of anxiety will decrease Outcome: Progressing   Problem: Nutrition: Goal: Adequate nutrition will be maintained Outcome: Progressing   Problem: Safety: Goal: Ability to remain free from injury will improve Outcome: Progressing

## 2020-09-15 NOTE — Progress Notes (Signed)
Skin swarm with Dorthula Matas. Sitter at bedside for safety. Pt oriented to room and call light system.

## 2020-09-15 NOTE — Consult Note (Signed)
Neurosurgery-New Consultation Evaluation 09/15/2020 Daisy Watkins [id number]  Identifying Statement: Daisy Watkins is a 87 y.o. female from [place] [id number] with meningioma  Physician Requesting Consultation: Dr Francine Graven  History of Present Illness: Daisy Watkins is seen today after being admitted for a newly diagnosed meningioma.  Per the family, she has had confusion, decreased oral intake and some decline over the past few weeks.  I do note that this may be gone over the past few months.  They state that did not notice anything prior to this year.  She had a thorough laboratory evaluation which was largely unremarkable with the exception of uncontrolled diabetes.  She has a prior A1c greater than 9.  She did have a CT scan and an MRI of the brain performed which showed a small left anterior temporal extra-axial mass, likely meningioma with some edema.  She was admitted for observation.  Per the patient, she feels like she is back at her baseline and is ready to go home.  She does not endorse any pain.  She does not endorse any speech problems, weakness, or numbness.  Past Medical History:  Past Medical History:  Diagnosis Date   Anxiety    Constipation 08/17/2015   Diabetes mellitus without complication ([place])    DVT (deep venous thrombosis) ([place]) 2006   chronic anticoagulation   Encounter for monitoring Coumadin therapy 12/16/2016   GERD (gastroesophageal reflux disease)    Hyperlipidemia    Hypertension    Insomnia    Monitoring for anticoagulant use 09/21/2014   Goal INR 2-3; recurrent DVTs, paroxysmal atrial fibrillation.  Unable to take Xaralto   Myalgia 02/23/2017   Sebaceous cyst 06/11/2015   Skin lesion of back 09/05/2016   Trapezius muscle spasm 05/22/2017   Urinary frequency 11/12/2016   Vitamin B12 deficiency    Vitamin D deficiency disease     Social History: Social History   Socioeconomic History   Marital status: Divorced    Spouse name: Not on file   Number of  children: Not on file   Years of education: Not on file   Highest education level: GED or equivalent  Occupational History   Not on file  Tobacco Use   Smoking status: Former    Packs/day: 0.50    Years: 30.00    Pack years: 15.00    Types: Cigarettes    Quit date: 03/31/1977    Years since quitting: 43.4   Smokeless tobacco: Never  Vaping Use   Vaping Use: Never used  Substance and Sexual Activity   Alcohol use: No    Alcohol/week: 0.0 standard drinks    Comment: rare   Drug use: No   Sexual activity: Not Currently  Other Topics Concern   Not on file  Social History Narrative   Not on file   Social Determinants of Health   Financial Resource Strain: Medium Risk   Difficulty of Paying Living Expenses: Somewhat hard  Food Insecurity: No Food Insecurity   Worried About Charity fundraiser in the Last Year: Never true   Ran Out of Food in the Last Year: Never true  Transportation Needs: No Transportation Needs   Lack of Transportation (Medical): No   Lack of Transportation (Non-Medical): No  Physical Activity: Insufficiently Active   Days of Exercise per Week: 2 days   Minutes of Exercise per Session: 20 min  Stress: Stress Concern Present   Feeling of Stress : To some extent  Social Connections: Not on  file  Intimate Partner Violence: Not on file     Family History: Family History  Problem Relation Age of Onset   Diabetes Maternal Grandmother    Hypertension Mother    Cancer Daughter        breast   Heart disease Son    Heart disease Son     Review of Systems:  Review of Systems - General ROS: Negative Psychological ROS: Negative Ophthalmic ROS: Negative ENT ROS: Negative Hematological and Lymphatic ROS: Negative  Endocrine ROS: Negative Respiratory ROS: Negative Cardiovascular ROS: Negative Gastrointestinal ROS: Negative Genito-Urinary ROS: Negative Musculoskeletal ROS: Negative Neurological ROS: Negative for headache Dermatological ROS:  Negative  Physical Exam: BP (!) 144/59 (BP Location: Right Leg)   Pulse (!) 45   Temp 97.6 F (36.4 C) (Oral)   Resp 18   Ht 5\' 6"  (1.676 m)   Wt 79.4 kg   LMP  (LMP Unknown)   SpO2 100%   BMI 28.25 kg/m  Body mass index is 28.25 kg/m. Body surface area is 1.92 meters squared. General appearance: Alert, does not fully cooperate with exam.  Appears agitated Head: Normocephalic, atraumatic Eyes: Normal, EOM intact Oropharynx: Does not cooperate with exam Ext: No edema in LE bilaterally  Neurologic exam:  Mental status: alertness: alert, orientation: Oriented to person, stated hospital, stated 2022 affect: normal Speech: fluent and clear, names a few objects would not repeat or name all objects as she was uncooperative Cranial nerves:  Would not participate in exam and refused to show facial movements.  The remainder of the exam was deferred as the patient does not want to participate in the exam and expressed agitation.  Gait: Not tested  Laboratory: Results for orders placed or performed during the hospital encounter of 09/14/20  Resp Panel by RT-PCR (Flu A&B, Covid) Nasopharyngeal Swab   Specimen: Nasopharyngeal Swab; Nasopharyngeal(NP) swabs in vial transport medium  Result Value Ref Range   SARS Coronavirus 2 by RT PCR NEGATIVE NEGATIVE   Influenza A by PCR NEGATIVE NEGATIVE   Influenza B by PCR NEGATIVE NEGATIVE  Comprehensive metabolic panel  Result Value Ref Range   Sodium 135 135 - 145 mmol/L   Potassium 3.9 3.5 - 5.1 mmol/L   Chloride 99 98 - 111 mmol/L   CO2 25 22 - 32 mmol/L   Glucose, Bld 286 (H) 70 - 99 mg/dL   BUN 14 8 - 23 mg/dL   Creatinine, Ser 0.76 0.44 - 1.00 mg/dL   Calcium 9.8 8.9 - 10.3 mg/dL   Total Protein 7.3 6.5 - 8.1 g/dL   Albumin 4.1 3.5 - 5.0 g/dL   AST 20 15 - 41 U/L   ALT 22 0 - 44 U/L   Alkaline Phosphatase 88 38 - 126 U/L   Total Bilirubin 2.0 (H) 0.3 - 1.2 mg/dL   GFR, Estimated >60 >60 mL/min   Anion gap 11 5 - 15  Ethanol   Result Value Ref Range   Alcohol, Ethyl (B) <52 <84 mg/dL  Salicylate level  Result Value Ref Range   Salicylate Lvl <1.3 (L) 7.0 - 30.0 mg/dL  Acetaminophen level  Result Value Ref Range   Acetaminophen (Tylenol), Serum <10 (L) 10 - 30 ug/mL  cbc  Result Value Ref Range   WBC 6.1 4.0 - 10.5 K/uL   RBC 5.02 3.87 - 5.11 MIL/uL   Hemoglobin 14.6 12.0 - 15.0 g/dL   HCT 42.7 36.0 - 46.0 %   MCV 85.1 80.0 - 100.0 fL  MCH 29.1 26.0 - 34.0 pg   MCHC 34.2 30.0 - 36.0 g/dL   RDW 13.2 11.5 - 15.5 %   Platelets 172 150 - 400 K/uL   nRBC 0.0 0.0 - 0.2 %  Urine Drug Screen, Qualitative  Result Value Ref Range   Tricyclic, Ur Screen NONE DETECTED NONE DETECTED   Amphetamines, Ur Screen NONE DETECTED NONE DETECTED   MDMA (Ecstasy)Ur Screen NONE DETECTED NONE DETECTED   Cocaine Metabolite,Ur [place] NONE DETECTED NONE DETECTED   Opiate, Ur Screen NONE DETECTED NONE DETECTED   Phencyclidine (PCP) Ur S NONE DETECTED NONE DETECTED   Cannabinoid 50 Ng, Ur [place] NONE DETECTED NONE DETECTED   Barbiturates, Ur Screen NONE DETECTED NONE DETECTED   Benzodiazepine, Ur Scrn NONE DETECTED NONE DETECTED   Methadone Scn, Ur NONE DETECTED NONE DETECTED  Urinalysis, Routine w reflex microscopic Urine, Clean Catch  Result Value Ref Range   Color, Urine AMBER (A) YELLOW   APPearance HAZY (A) CLEAR   Specific Gravity, Urine 1.026 1.005 - 1.030   pH 5.0 5.0 - 8.0   Glucose, UA >=500 (A) NEGATIVE mg/dL   Hgb urine dipstick LARGE (A) NEGATIVE   Bilirubin Urine NEGATIVE NEGATIVE   Ketones, ur 80 (A) NEGATIVE mg/dL   Protein, ur 30 (A) NEGATIVE mg/dL   Nitrite NEGATIVE NEGATIVE   Leukocytes,Ua NEGATIVE NEGATIVE   RBC / HPF 0-5 0 - 5 RBC/hpf   WBC, UA 6-10 0 - 5 WBC/hpf   Bacteria, UA RARE (A) NONE SEEN   Squamous Epithelial / LPF 11-20 0 - 5   Mucus PRESENT    Hyaline Casts, UA PRESENT   Basic metabolic panel  Result Value Ref Range   Sodium 134 (L) 135 - 145 mmol/L   Potassium 3.8 3.5 - 5.1 mmol/L    Chloride 102 98 - 111 mmol/L   CO2 23 22 - 32 mmol/L   Glucose, Bld 309 (H) 70 - 99 mg/dL   BUN 16 8 - 23 mg/dL   Creatinine, Ser 0.55 0.44 - 1.00 mg/dL   Calcium 9.2 8.9 - 10.3 mg/dL   GFR, Estimated >60 >60 mL/min   Anion gap 9 5 - 15  CBC  Result Value Ref Range   WBC 5.7 4.0 - 10.5 K/uL   RBC 4.72 3.87 - 5.11 MIL/uL   Hemoglobin 13.6 12.0 - 15.0 g/dL   HCT 39.4 36.0 - 46.0 %   MCV 83.5 80.0 - 100.0 fL   MCH 28.8 26.0 - 34.0 pg   MCHC 34.5 30.0 - 36.0 g/dL   RDW 13.1 11.5 - 15.5 %   Platelets 158 150 - 400 K/uL   nRBC 0.0 0.0 - 0.2 %  Glucose, capillary  Result Value Ref Range   Glucose-Capillary 323 (H) 70 - 99 mg/dL   Comment 1 Notify RN   CBG monitoring, ED  Result Value Ref Range   Glucose-Capillary 253 (H) 70 - 99 mg/dL   I personally reviewed labs  Imaging: Results for orders placed during the hospital encounter of 09/14/20  MR Brain W and Wo Contrast  Narrative CLINICAL DATA:  Altered mental status  EXAM: MRI HEAD WITHOUT AND WITH CONTRAST  TECHNIQUE: Multiplanar, multiecho pulse sequences of the brain and surrounding structures were obtained without and with intravenous contrast.  CONTRAST:  7.35mL GADAVIST GADOBUTROL 1 MMOL/ML IV SOLN  COMPARISON:  None.  FINDINGS: Brain: No acute infarct, mass effect or extra-axial collection. No acute or chronic hemorrhage. Hyperintense T2-weighted signal within the anterior left  temporal lobe. The midline structures are normal. There is a contrast-enhancing mass in the left paraclinoid region, which is dural based. It measures 3.1 x 2.3 cm.  Vascular: The above-described contrast-enhancing mass extends into the left cavernous sinus. The left internal carotid artery flow void is preserved.  Skull and upper cervical spine: Normal calvarium and skull base. Visualized upper cervical spine and soft tissues are normal.  Sinuses/Orbits:No paranasal sinus fluid levels or advanced mucosal thickening. No mastoid or  middle ear effusion. Normal orbits.  IMPRESSION: 1. No acute intracranial abnormality. 2. Left paraclinoid meningioma with vasogenic edema in the left anterior temporal lobe. There is likely invasion of the left cavernous sinus.   Electronically Signed By: Ulyses Jarred M.D. On: 09/14/2020 22:25  I personally reviewed radiology studies to include:   Impression/Plan:  Ms. Porada is here for evaluation of a small paraclinoid meningioma.  I did discuss with her family that the location of this mass as well as the adjacent edema does not explain her symptoms.  This mass is also calcified and suggest this is been there for some time.  Given the more acute symptoms, I do think that this may be related to her chronic comorbidities versus new age-related changes.  I do recommend follow-up with her primary care provider to control her diabetes as this can also contribute to this.  I do not think the steroids are beneficial at this time and recommended discontinuing these.  She additionally may benefit from neurology referral to ensure there is no component of dementia.  We discussed the treatment of the meningioma which would consist of radiation versus observation.  There are some risk of radiation and I do recommend referral to radiation oncology to discuss these.  We also discussed with family that they could continue to just watch for new symptoms and radiate as needed in the future.  No surgery is recommended as the risk of this far outweigh any benefit to the patient.   1.  Diagnosis: Meningioma  2.  Plan -No surgery recommended -Patient can speak with radiation oncology on benefits of treatment versus no treatment -No steroids recommended

## 2020-09-15 NOTE — Discharge Summary (Addendum)
[facility] Discharge Summary   Patient: Daisy Watkins HEN:[id number]  PCP: Valerie Roys, DO  Date of admission: 09/14/2020   Date of discharge: 09/15/2020      Discharge Diagnoses:  Principal Problem:   AMS (altered mental status) Active Problems:   Type 2 diabetes mellitus with hyperglycemia (HCC)   Benign essential hypertension   Paroxysmal A-fib (HCC)   Major depression   History of DVT (deep vein thrombosis)   Meningioma ([place])   Confusion   Admitted From: Home Disposition:  Home  Recommendations for Outpatient Follow-up:  PCP: Follow-up with PCP in 1 week.   Follow-up Information     Park Liter P, DO. Schedule an appointment as soon as possible for a visit in 2 week(s).   Specialty: Family Medicine Contact information: [place] [id number] [phone]                Discharge Instructions     Ambulatory referral to Neurology   Complete by: As directed    Ambulatory referral to Radiation Oncology   Complete by: As directed    Diet - low sodium heart healthy   Complete by: As directed    Discharge instructions   Complete by: As directed    It is important that you read the instructions as well as go over your medication list with RN to help you understand your care after this hospitalization.  Please follow-up with PCP in 1-2 weeks.  Please note that we are unable to authorize any refills for discharge medications, once you are discharged. Thus, it is imperative that you return to your primary care physician (or establish a relationship with a primary care physician if you do not have one) for your care needs. So that they can reassess your need for medications and monitor your lab values.  Please request your primary care physician to go over all Hospital Tests and Procedure/Radiological results at the follow up. Please get all Hospital records sent to your PCP by signing hospital release before you go home.   Do not drive, operating  heavy machinery, perform activities at heights, swimming or participation in water activities or provide baby sitting services until cleared by Primary Care Physician and are cleared to do such activities.  Do not take more than prescribed Pain, Sleep and Anxiety Medications.  You were cared for by a hospitalist during your hospital stay. If you have any questions about your discharge medications or the care you received while you were in the hospital after you are discharged, you can call the hospital unit/floor you were admitted to and ask to speak with the hospitalist who took care of you. Ask for Hospitalist on call, if the hospitalist that took care of you is not available. Once you are discharged, your primary care physician will help you with any further medical issues. You Must read complete instructions/literature along with all the possible adverse reactions/side effects for all the Medicines you take and that have been prescribed to you. Take any new Medicines after you have completely understood and accept all the possible adverse reactions/side effects.   Increase activity slowly   Complete by: As directed        Diet recommendation: Regular diet  Activity: The patient is advised to gradually reintroduce usual activities, as tolerated  Discharge Condition: stable  Code Status: Full code   History of present illness: As per the H and P dictated on admission, "Daisy Watkins is a  87 y.o. female with medical history significant for anxiety, DVT, diabetes mellitus, GERD, insomnia and hypertension who was brought into the ER after her daughter took out IVC papers on her due to medication noncompliance, poor oral intake and worsening confusion. Her daughter states that the patient recently told her she stopped taking her medications 4 months ago.  She has had poor oral intake and states that she has no appetite.  Family is concerned because she has been speaking to her husband and son who  are both dead.  Over the last 1 week she has noticed a progressive decline in her mother's mental status and was worried she may have hyperglycemia since she stopped taking her insulin so she took her IVC papers and brought her mother to the hospital. Patient is sitting up in bed in the ER and denies having any complaints.  She is awake, alert and oriented to person place and time. She denies having any chest pain, no shortness of breath, no dizziness, no lightheadedness, no palpitations, no abdominal pain, no changes in her bowel habits, no urinary symptoms, no fever, no chills, no headache, no blurred vision, no focal deficits. Labs show sodium 135, potassium 3.9, chloride 99, bicarb 25, glucose 286, BUN 14, creatinine 0.76, calcium 9.8, alkaline phosphatase 88, albumin 4.1, AST 20, ALT 22, total protein 7.3, total bilirubin 2.0, white count 6.1, hemoglobin 14.6, hematocrit 42.7, MCV 85.1, RDW 13.2, platelet count 172, Tylenol level less than 10, salicylate level less than 7 CT scan of the head without contrast shows  . 2 cm partially calcified mass within the left middle cranial fossa, potentially associated with the left cavernous sinus. Findings could be secondary to meningioma, though giant aneurysm is also in the differential. There is edema within the left temporal lobe. Recommend further evaluation with MRI. Atrophy and chronic small vessel ischemic changes of the white matter. MRI of the brain without contrast shows No acute intracranial abnormality. Left paraclinoid meningioma with vasogenic edema in the left anterior temporal lobe. There is likely invasion of the left cavernous sinus."  Hospital Course:  Summary of her active problems in the hospital is as following.  Acute metabolic encephalopathy. Presents with confusion.  Given with IV hydration  Mentation improved and appears to be back to baseline. Recommend continuing hydration outpatient.  Meningioma. Neurosurgery was  consulted. MRI brain shows vasogenic edema as well. Patient was also initiated on Decadron. At present neurosurgery does not think that the surgery is required for this condition. Also recommend radiation oncology referral which we will be placed. No steroid therapy on discharge or currently right now but No concern for seizures right now.  Concern for mood disorder. Likely dementia no delirium. Psychiatry was consulted no current therapy recommended. Outpatient follow-up with PCP for further secondary dementia work-up.  History of DVT Paroxysmal A. fib Continue Eliquis. Heart rate currently controlled.  Type 2 diabetes mellitus. Current modified diet. Continue current regimen.  Essential hypertension. Continue ramipril.  Patient was seen by physical therapy, who recommended Home Health. On the day of the discharge the patient's vitals were stable, and no other new acute medical condition were reported. The patient was felt safe to be discharge at Home with Home health.  Consultants: Neurosurgery, psychiatric Procedures: None  DISCHARGE MEDICATION: Allergies as of 09/15/2020       Reactions   Ciprofloxacin Palpitations   Severe Headache   Clindamycin/lincomycin Nausea Only   Made her head hurt   Elemental Sulfur Itching   Hctz [  hydrochlorothiazide] Other (See Comments)   Abdominal pain   Doxycycline Hyclate Palpitations        Medication List     TAKE these medications    acetaminophen 500 MG tablet Commonly known as: TYLENOL Take 500 mg every 6 (six) hours as needed by mouth. As needed   amLODipine 10 MG tablet Commonly known as: NORVASC Take 1 tablet (10 mg total) by mouth daily.   Eliquis 5 MG Tabs tablet Generic drug: apixaban TAKE 1 TABLET (5 MG TOTAL) BY MOUTH 2 (TWO) TIMES DAILY. NEED OFFICE VISIT FOR FURTHER REFILLS What changed: See the new instructions.   LORazepam 1 MG tablet Commonly known as: ATIVAN Take 1 mg by mouth at bedtime as  needed.   ramipril 10 MG capsule Commonly known as: ALTACE TAKE 1 TABLET BY MOUTH ONCE DAILY. TAKE WITH 5MG  FOR TOTAL OF 15MG  DAILY What changed:  how much to take how to take this when to take this   ramipril 5 MG capsule Commonly known as: ALTACE TAKE ONE CAPSULE BY MOUTH DAILY WITH 10 MG CAPSULE FOR A TOTAL OF 15 MG DAILY What changed: See the new instructions.   sertraline 50 MG tablet Commonly known as: ZOLOFT TAKE 1 TABLET BY MOUTH EVERY DAY   Tresiba 100 UNIT/ML Soln Generic drug: Insulin Degludec INJECT 24 UNITS INTO THE SKIN AT BEDTIME.   TrueTrack Test test strip Generic drug: glucose blood   Trulicity 1.5 IW/5.8KD Sopn Generic drug: Dulaglutide INJECT 1.5 MG INTO THE SKIN ONCE A WEEK.               Durable Medical Equipment  (From admission, onward)           Start     Ordered   09/15/20 1128  For home use only DME Walker rolling  Once       Question Answer Comment  Walker: With [place]   Patient needs a walker to treat with the following condition Physical deconditioning      09/15/20 1127            Discharge Exam: Filed Weights   09/14/20 1302  Weight: 79.4 kg   Vitals:   09/15/20 0541 09/15/20 1250  BP: (!) 144/59 (!) 153/66  Pulse: (!) 45 70  Resp: 18 18  Temp: 97.6 F (36.4 C) 97.7 F (36.5 C)  SpO2: 100% 99%   General: Appear in mild distress, no Rash; Oral Mucosa Clear, moist. no Abnormal Neck Mass Or lumps, Conjunctiva normal  Cardiovascular: S1 and S2 Present, no Murmur, Respiratory: good respiratory effort, Bilateral Air entry present and CTA, no Crackles, no wheezes Abdomen: Bowel Sound present, Soft and no tenderness Extremities: no Pedal edema Neurology: alert and oriented to time, place, and person affect appropriate. no new focal deficit Gait not checked due to patient safety concerns    The results of significant diagnostics from this hospitalization (including imaging, microbiology, ancillary and  laboratory) are listed below for reference.    Significant Diagnostic Studies: CT Head Wo Contrast  Result Date: 09/14/2020 CLINICAL DATA:  Mental status change EXAM: CT HEAD WITHOUT CONTRAST TECHNIQUE: Contiguous axial images were obtained from the base of the skull through the vertex without intravenous contrast. COMPARISON:  None. FINDINGS: Brain: No acute territorial infarction or hemorrhage is visualized. Moderate atrophy. Minimal chronic small vessel ischemic change of the white matter. Low-density edema within the left temporal lobe. 2 x 2 cm partially calcified mass, probably extra-axial and abutting the medial temporal  lobe. This appears to be associated with left cavernous sinus, series 3, image 6. Vascular: No hyperdense vessels.  Carotid vascular calcification Skull: Normal. Negative for fracture or focal lesion. Sinuses/Orbits: No acute finding. Other: None IMPRESSION: 1. 2 cm partially calcified mass within the left middle cranial fossa, potentially associated with the left cavernous sinus. Findings could be secondary to meningioma, though giant aneurysm is also in the differential. There is edema within the left temporal lobe. Recommend further evaluation with MRI. 2. Atrophy and chronic small vessel ischemic changes of the white matter. Electronically Signed   By: Donavan Foil M.D.   On: 09/14/2020 16:04   MR ANGIO HEAD WO CONTRAST  Result Date: 09/14/2020 CLINICAL DATA:  Encephalopathy EXAM: MRA HEAD WITHOUT CONTRAST TECHNIQUE: Angiographic images of the Circle of Willis were acquired using MRA technique without intravenous contrast. COMPARISON:  No pertinent prior exam. FINDINGS: POSTERIOR CIRCULATION: --Vertebral arteries: Normal --Inferior cerebellar arteries: Normal. --Basilar artery: Normal. --Superior cerebellar arteries: Normal. --Posterior cerebral arteries: Normal. ANTERIOR CIRCULATION: --Intracranial internal carotid arteries: Normal. --Anterior cerebral arteries (ACA): Normal.  --Middle cerebral arteries (MCA): Normal. ANATOMIC VARIANTS: Both posterior communicating arteries are patent. Other: None. IMPRESSION: Normal intracranial MRA. Electronically Signed   By: Ulyses Jarred M.D.   On: 09/14/2020 22:28   MR Brain W and Wo Contrast  Result Date: 09/14/2020 CLINICAL DATA:  Altered mental status EXAM: MRI HEAD WITHOUT AND WITH CONTRAST TECHNIQUE: Multiplanar, multiecho pulse sequences of the brain and surrounding structures were obtained without and with intravenous contrast. CONTRAST:  7.75mL GADAVIST GADOBUTROL 1 MMOL/ML IV SOLN COMPARISON:  None. FINDINGS: Brain: No acute infarct, mass effect or extra-axial collection. No acute or chronic hemorrhage. Hyperintense T2-weighted signal within the anterior left temporal lobe. The midline structures are normal. There is a contrast-enhancing mass in the left paraclinoid region, which is dural based. It measures 3.1 x 2.3 cm. Vascular: The above-described contrast-enhancing mass extends into the left cavernous sinus. The left internal carotid artery flow void is preserved. Skull and upper cervical spine: Normal calvarium and skull base. Visualized upper cervical spine and soft tissues are normal. Sinuses/Orbits:No paranasal sinus fluid levels or advanced mucosal thickening. No mastoid or middle ear effusion. Normal orbits. IMPRESSION: 1. No acute intracranial abnormality. 2. Left paraclinoid meningioma with vasogenic edema in the left anterior temporal lobe. There is likely invasion of the left cavernous sinus. Electronically Signed   By: Ulyses Jarred M.D.   On: 09/14/2020 22:25    Microbiology: Recent Results (from the past 240 hour(s))  Resp Panel by RT-PCR (Flu A&B, Covid) Nasopharyngeal Swab     Status: None   Collection Time: 09/14/20 11:15 PM   Specimen: Nasopharyngeal Swab; Nasopharyngeal(NP) swabs in vial transport medium  Result Value Ref Range Status   SARS Coronavirus 2 by RT PCR NEGATIVE NEGATIVE Final    Comment:  (NOTE) SARS-CoV-2 target nucleic acids are NOT DETECTED.  The SARS-CoV-2 RNA is generally detectable in upper respiratory specimens during the acute phase of infection. The lowest concentration of SARS-CoV-2 viral copies this assay can detect is 138 copies/mL. A negative result does not preclude SARS-Cov-2 infection and should not be used as the sole basis for treatment or other patient management decisions. A negative result may occur with  improper specimen collection/handling, submission of specimen other than nasopharyngeal swab, presence of viral mutation(s) within the areas targeted by this assay, and inadequate number of viral copies(<138 copies/mL). A negative result must be combined with clinical observations, patient history, and epidemiological information. The  expected result is Negative.  Fact Sheet for Patients:  EntrepreneurPulse.com.au  Fact Sheet for Healthcare Providers:  IncredibleEmployment.be  This test is no t yet approved or cleared by the [place] FDA and  has been authorized for detection and/or diagnosis of SARS-CoV-2 by FDA under an Emergency Use Authorization (EUA). This EUA will remain  in effect (meaning this test can be used) for the duration of the COVID-19 declaration under Section 564(b)(1) of the Act, 21 U.S.C.section 360bbb-3(b)(1), unless the authorization is terminated  or revoked sooner.       Influenza A by PCR NEGATIVE NEGATIVE Final   Influenza B by PCR NEGATIVE NEGATIVE Final    Comment: (NOTE) The Xpert Xpress SARS-CoV-2/FLU/RSV plus assay is intended as an aid in the diagnosis of influenza from Nasopharyngeal swab specimens and should not be used as a sole basis for treatment. Nasal washings and aspirates are unacceptable for Xpert Xpress SARS-CoV-2/FLU/RSV testing.  Fact Sheet for Patients: EntrepreneurPulse.com.au  Fact Sheet for Healthcare  Providers: IncredibleEmployment.be  This test is not yet approved or cleared by the [place] FDA and has been authorized for detection and/or diagnosis of SARS-CoV-2 by FDA under an Emergency Use Authorization (EUA). This EUA will remain in effect (meaning this test can be used) for the duration of the COVID-19 declaration under Section 564(b)(1) of the Act, 21 U.S.C. section 360bbb-3(b)(1), unless the authorization is terminated or revoked.  Performed at [facility], [address] [place], [place] [address]      Labs: CBC: Recent Labs  Lab 09/14/20 1316 09/15/20 0533  WBC 6.1 5.7  HGB 14.6 13.6  HCT 42.7 39.4  MCV 85.1 83.5  PLT 172 893   Basic Metabolic Panel: Recent Labs  Lab 09/14/20 1316 09/15/20 0533  NA 135 134*  K 3.9 3.8  CL 99 102  CO2 25 23  GLUCOSE 286* 309*  BUN 14 16  CREATININE 0.76 0.55  CALCIUM 9.8 9.2   Liver Function Tests: Recent Labs  Lab 09/14/20 1316  AST 20  ALT 22  ALKPHOS 88  BILITOT 2.0*  PROT 7.3  ALBUMIN 4.1   CBG: Recent Labs  Lab 09/14/20 2253 09/15/20 0808 09/15/20 1254  GLUCAP 253* 323* 332*    Time spent: 35 minutes  Signed:  Berle Mull  [facility] 09/15/2020 7:18 PM

## 2020-09-15 NOTE — Evaluation (Addendum)
Physical Therapy Evaluation Patient Details Name: Daisy Watkins MRN: [id number] DOB: Aug 06, 1932 Today's Date: 09/15/2020   History of Present Illness  Pt is an 87 y/o admitted on 09/14/20 with c/c of change in mental status. MRI reveals a L paraclinoid meningioma with vasogenic edema. Neurosurgery did not feel imaging findings explained pt's symptoms. PMH: anxiety, DVT, DM, GERD, insomnia, HTN, HLD, insomnia, myalgia  Clinical Impression  Pt seen for PT evaluation with daughter present in room. Pt demonstrates impaired cognition as she's only oriented to self, location, and year. Pt is currently mod I with gait without AD & supervision for stairs with B rails. Pt is impulsive with stair negotiation and would benefit from intermittent supervision upon d/c but pt's daughter reports pt has 24 hr supervision. Pt does not demonstrate any acute PT needs at this time, will sign off.   Addendum: nurse cleared pt for participation in PT in setting of slightly elevated glucose, reporting they will have to re-check it again before administering meds.    Follow Up Recommendations No PT follow up;Supervision - Intermittent    Equipment Recommendations  None recommended by PT    Recommendations for Other Services       Precautions / Restrictions Precautions Precautions: None Restrictions Weight Bearing Restrictions: No      Mobility  Bed Mobility               General bed mobility comments: not observed, pt received & left sitting on EOB    Transfers Overall transfer level: Independent               General transfer comment: sit<>stand without AD  Ambulation/Gait Ambulation/Gait assistance: Independent Gait Distance (Feet): 275 Feet Assistive device: None Gait Pattern/deviations: WFL(Within Functional Limits)        Stairs Stairs: Yes Stairs assistance: Min assist;Supervision Stair Management: Two rails Number of Stairs: 6 (x 4 sets) General stair comments: pt  initially requires min assist to negotiate stairs but then is able to complete stair negotiation with supervision; pt impulsive, continuing to negotiate multiple sets of stairs despite instructions to stop - PT provides HHA when descending to ensure pt continues to descend stairs  Wheelchair Mobility    Modified Rankin (Stroke Patients Only)       Balance Overall balance assessment: Modified Independent                                           Pertinent Vitals/Pain Pain Assessment: No/denies pain    Home Living Family/patient expects to be discharged to:: Private residence Living Arrangements: Other relatives (daughter in law & mother of pt's daughter in law) Available Help at Discharge: Family;Available 24 hours/day (someone is with pt 24 hrs/day) Type of Home: Mobile home Home Access: Stairs to enter Entrance Stairs-Rails: Right;Left;Can reach both Entrance Stairs-Number of Steps: 6 Home Layout: One level Home Equipment: None      Prior Function Level of Independence: Independent         Comments: independent with mobility without AD     Hand Dominance        Extremity/Trunk Assessment   Upper Extremity Assessment Upper Extremity Assessment: Overall WFL for tasks assessed    Lower Extremity Assessment Lower Extremity Assessment: Overall WFL for tasks assessed       Communication   Communication: No difficulties  Cognition Arousal/Alertness: Awake/alert Behavior During Therapy: WFL for  tasks assessed/performed;Impulsive Overall Cognitive Status: Impaired/Different from baseline Area of Impairment: Following commands;Awareness;Safety/judgement;Memory;Problem solving                 Orientation Level: Disoriented to;Time;Situation   Memory: Decreased short-term memory Following Commands: Follows one step commands with increased time   Awareness: Anticipatory   General Comments: AxO to self, location & year but unable to  recall date nor reason for hospitalization; attempts to joke with PT vs answering orientation questions at first, pt impulsive during stair negotiation as she continues to negotiate several sets despite PT instructing her to stop      General Comments      Exercises     Assessment/Plan    PT Assessment Patent does not need any further PT services  PT Problem List         PT Treatment Interventions      PT Goals (Current goals can be found in the Care Plan section)  Acute Rehab PT Goals Patient Stated Goal: go home PT Goal Formulation: With patient/family Time For Goal Achievement: 09/29/20 Potential to Achieve Goals: Good    Frequency     Barriers to discharge        Co-evaluation               AM-PAC PT "6 Clicks" Mobility  Outcome Measure Help needed turning from your back to your side while in a flat bed without using bedrails?: None Help needed moving from lying on your back to sitting on the side of a flat bed without using bedrails?: None Help needed moving to and from a bed to a chair (including a wheelchair)?: None Help needed standing up from a chair using your arms (e.g., wheelchair or bedside chair)?: None Help needed to walk in hospital room?: None Help needed climbing 3-5 steps with a railing? : A Little 6 Click Score: 23    End of Session   Activity Tolerance: Patient tolerated treatment well Patient left:  (sitting EOB with daughter present in room)        Time: 5681-2751 PT Time Calculation (min) (ACUTE ONLY): 11 min   Charges:   PT Evaluation $PT Eval Low Complexity: 1 Low          Lavone Nian, PT, DPT 09/15/20, 2:23 PM   Waunita Schooner 09/15/2020, 2:19 PM

## 2020-09-15 NOTE — Care Management CC44 (Signed)
Condition Code 44 Documentation Completed  Patient Details  Name: SHEVELLE SMITHER MRN: [id number] Date of Birth: Jun 11, 1932   Condition Code 44 given:  Yes Patient signature on Condition Code 44 notice:  Yes Documentation of 2 MD's agreement:  Yes Code 44 added to claim:  Yes    Harriet Masson, RN 09/15/2020, 12:37 PM

## 2020-09-15 NOTE — Consult Note (Signed)
[facility] Face-to-Face Psychiatry Consult   Reason for Consult:  altered mental status Referring Physician: Dr. Royce Macadamia Patient Identification: Daisy Watkins MRN:  [id number] Principal Diagnosis: AMS (altered mental status) Diagnosis:  Principal Problem:   AMS (altered mental status) Active Problems:   Type 2 diabetes mellitus with hyperglycemia (HCC)   Benign essential hypertension   Paroxysmal A-fib (HCC)   Major depression   History of DVT (deep vein thrombosis)   Meningioma ([place])   Total Time spent with patient: 45 minutes  Subjective:   Daisy Watkins is a 87 y.o. female patient with psychiatric history of anxiety and medical history of DM, DVT, GERD, HTN, admitted with altered mental status. Psychiatry consulted for assistance.   HPI:  Per chart review, patient was brought to ED by family due to decreased taking care of self, poor oral intake, confusion for last 2-3 weeks. Patient reportedly stopped taking her outpatient medications. ER workup revealed hyperglycemia, and possible meningioma with vasogenic edema in the left temporal lobe. Patient admitted omn the medical floor for further evaluation.  Patient seen in hospital room today. Two of her children are at bedsite. Patient reports "I am okay. I want to go home". Patient is awake, alert, oriented in self, place "I am in the hospital that used to be [facility] and not is [facility]", date "this is July 18th 2000, my [birth year]". She admits to stopping taking all her medications and states she did it after her outpatient doctor advised her to do that "they told me to stop all my meds besides Lorazepam". She reports "good" mood, denies feeling depressed. Reports some anxiety "I worry about my family", tells me that as children, grandchildren and great grandchildren and wants to be sure they all are doing well. She tells that her husband and one of her sons passed away, that she misses them dearly and that sometimes hears their voices; she  realizes "voices" and not real, she thinks "this is my memory about them". She denies thoughts, plans of hurting self or others. She reports variable sleep and low appetite. She understands that her stopping medications lead her to this hospital admission now. She states she will consider doctors recommendations and might restart her medications if given reasonable explanation. She is agreeable to restart low-dose of Zoloft for anxiety.  Spoke with patient`s daughter after patients permission obtained. She reports that the family had noticed patient`s increased confusion and unusual behavior about three weeks ago. The change in mentation was pretty sudden and before 3 weeks ago patient was "normal self". Patient`s baseline - fully oriented, not hallucinating, not confused, taking care of self. Patient`s daughter reports possible fluctuating confusion throughout a day. Patient`s daughter reports that today her mother appears to be closer to her baseline so far.  Past Psychiatric History:  Past Dx: anxiety Denies h/o suicidal attempts Denies past psych admissions Outpatient medications: Zoloft 50mg /day, Lorazepam 1mg  PO QHS PRN.  Social History: Widowed 5 children (one son deceased) Lives with family No substance abuse No guns   Risk to Self:   Risk to Others:   Prior Inpatient Therapy:   Prior Outpatient Therapy:    Past Medical History:  Past Medical History:  Diagnosis Date   Anxiety    Constipation 08/17/2015   Diabetes mellitus without complication ([place])    DVT (deep venous thrombosis) ([place]) 2006   chronic anticoagulation   Encounter for monitoring Coumadin therapy 12/16/2016   GERD (gastroesophageal reflux disease)    Hyperlipidemia  Hypertension    Insomnia    Monitoring for anticoagulant use 09/21/2014   Goal INR 2-3; recurrent DVTs, paroxysmal atrial fibrillation.  Unable to take Xaralto   Myalgia 02/23/2017   Sebaceous cyst 06/11/2015   Skin lesion of back 09/05/2016    Trapezius muscle spasm 05/22/2017   Urinary frequency 11/12/2016   Vitamin B12 deficiency    Vitamin D deficiency disease     Past Surgical History:  Procedure Laterality Date   ABDOMINAL HYSTERECTOMY     Family History:  Family History  Problem Relation Age of Onset   Diabetes Maternal Grandmother    Hypertension Mother    Cancer Daughter        breast   Heart disease Son    Heart disease Son    Family Psychiatric  History: unknown Social History:  Social History   Substance and Sexual Activity  Alcohol Use No   Alcohol/week: 0.0 standard drinks   Comment: rare     Social History   Substance and Sexual Activity  Drug Use No    Social History   Socioeconomic History   Marital status: Divorced    Spouse name: Not on file   Number of children: Not on file   Years of education: Not on file   Highest education level: GED or equivalent  Occupational History   Not on file  Tobacco Use   Smoking status: Former    Packs/day: 0.50    Years: 30.00    Pack years: 15.00    Types: Cigarettes    Quit date: 03/31/1977    Years since quitting: 43.4   Smokeless tobacco: Never  Vaping Use   Vaping Use: Never used  Substance and Sexual Activity   Alcohol use: No    Alcohol/week: 0.0 standard drinks    Comment: rare   Drug use: No   Sexual activity: Not Currently  Other Topics Concern   Not on file  Social History Narrative   Not on file   Social Determinants of Health   Financial Resource Strain: Medium Risk   Difficulty of Paying Living Expenses: Somewhat hard  Food Insecurity: No Food Insecurity   Worried About Charity fundraiser in the Last Year: Never true   Ran Out of Food in the Last Year: Never true  Transportation Needs: No Transportation Needs   Lack of Transportation (Medical): No   Lack of Transportation (Non-Medical): No  Physical Activity: Insufficiently Active   Days of Exercise per Week: 2 days   Minutes of Exercise per Session: 20 min  Stress:  Stress Concern Present   Feeling of Stress : To some extent  Social Connections: Not on file       Allergies:   Allergies  Allergen Reactions   Ciprofloxacin Palpitations    Severe Headache   Clindamycin/Lincomycin Nausea Only    Made her head hurt   Elemental Sulfur Itching   Hctz [Hydrochlorothiazide] Other (See Comments)    Abdominal pain   Doxycycline Hyclate Palpitations    Labs:  Results for orders placed or performed during the hospital encounter of 09/14/20 (from the past 48 hour(s))  Comprehensive metabolic panel     Status: Abnormal   Collection Time: 09/14/20  1:16 PM  Result Value Ref Range   Sodium 135 135 - 145 mmol/L   Potassium 3.9 3.5 - 5.1 mmol/L   Chloride 99 98 - 111 mmol/L   CO2 25 22 - 32 mmol/L   Glucose, Bld 286 (H) 70 -  99 mg/dL    Comment: Glucose reference range applies only to samples taken after fasting for at least 8 hours.   BUN 14 8 - 23 mg/dL   Creatinine, Ser 0.76 0.44 - 1.00 mg/dL   Calcium 9.8 8.9 - 10.3 mg/dL   Total Protein 7.3 6.5 - 8.1 g/dL   Albumin 4.1 3.5 - 5.0 g/dL   AST 20 15 - 41 U/L   ALT 22 0 - 44 U/L   Alkaline Phosphatase 88 38 - 126 U/L   Total Bilirubin 2.0 (H) 0.3 - 1.2 mg/dL   GFR, Estimated >60 >60 mL/min    Comment: (NOTE) Calculated using the CKD-EPI Creatinine Equation (2021)    Anion gap 11 5 - 15    Comment: Performed at [facility], [address] [place], [place] [address]  Ethanol     Status: None   Collection Time: 09/14/20  1:16 PM  Result Value Ref Range   Alcohol, Ethyl (B) <10 <10 mg/dL    Comment: (NOTE) Lowest detectable limit for serum alcohol is 10 mg/dL.  For medical purposes only. Performed at [facility], [address] [place], [place] [address]   Salicylate level     Status: Abnormal   Collection Time: 09/14/20  1:16 PM  Result Value Ref Range   Salicylate Lvl <6.3 (L) 7.0 - 30.0 mg/dL    Comment: Performed at [facility], [address]  [place], [place] [address]  Acetaminophen level     Status: Abnormal   Collection Time: 09/14/20  1:16 PM  Result Value Ref Range   Acetaminophen (Tylenol), Serum <10 (L) 10 - 30 ug/mL    Comment: (NOTE) Therapeutic concentrations vary significantly. A range of 10-30 ug/mL  may be an effective concentration for many patients. However, some  are best treated at concentrations outside of this range. Acetaminophen concentrations >150 ug/mL at 4 hours after ingestion  and >50 ug/mL at 12 hours after ingestion are often associated with  toxic reactions.  Performed at [facility], [address] [place], [place] [address]   cbc     Status: None   Collection Time: 09/14/20  1:16 PM  Result Value Ref Range   WBC 6.1 4.0 - 10.5 K/uL   RBC 5.02 3.87 - 5.11 MIL/uL   Hemoglobin 14.6 12.0 - 15.0 g/dL   HCT 42.7 36.0 - 46.0 %   MCV 85.1 80.0 - 100.0 fL   MCH 29.1 26.0 - 34.0 pg   MCHC 34.2 30.0 - 36.0 g/dL   RDW 13.2 11.5 - 15.5 %   Platelets 172 150 - 400 K/uL   nRBC 0.0 0.0 - 0.2 %    Comment: Performed at [facility], [address] [place], [place] [address]  Urine Drug Screen, Qualitative     Status: None   Collection Time: 09/14/20  2:50 PM  Result Value Ref Range   Tricyclic, Ur Screen NONE DETECTED NONE DETECTED   Amphetamines, Ur Screen NONE DETECTED NONE DETECTED   MDMA (Ecstasy)Ur Screen NONE DETECTED NONE DETECTED   Cocaine Metabolite,Ur [place] NONE DETECTED NONE DETECTED   Opiate, Ur Screen NONE DETECTED NONE DETECTED   Phencyclidine (PCP) Ur S NONE DETECTED NONE DETECTED   Cannabinoid 50 Ng, Ur [place] NONE DETECTED NONE DETECTED   Barbiturates, Ur Screen NONE DETECTED NONE DETECTED   Benzodiazepine, Ur Scrn NONE DETECTED NONE DETECTED   Methadone Scn, Ur NONE DETECTED NONE DETECTED    Comment: (NOTE) Tricyclics +  metabolites, urine    Cutoff 1000 ng/mL Amphetamines + metabolites, urine  Cutoff 1000 ng/mL MDMA (Ecstasy), urine              Cutoff 500  ng/mL Cocaine Metabolite, urine          Cutoff 300 ng/mL Opiate + metabolites, urine        Cutoff 300 ng/mL Phencyclidine (PCP), urine         Cutoff 25 ng/mL Cannabinoid, urine                 Cutoff 50 ng/mL Barbiturates + metabolites, urine  Cutoff 200 ng/mL Benzodiazepine, urine              Cutoff 200 ng/mL Methadone, urine                   Cutoff 300 ng/mL  The urine drug screen provides only a preliminary, unconfirmed analytical test result and should not be used for non-medical purposes. Clinical consideration and professional judgment should be applied to any positive drug screen result due to possible interfering substances. A more specific alternate chemical method must be used in order to obtain a confirmed analytical result. Gas chromatography / mass spectrometry (GC/MS) is the preferred confirm atory method. Performed at [facility], [address] [place], [place] [address]   Urinalysis, Routine w reflex microscopic Urine, Clean Catch     Status: Abnormal   Collection Time: 09/14/20  2:50 PM  Result Value Ref Range   Color, Urine AMBER (A) YELLOW    Comment: BIOCHEMICALS MAY BE AFFECTED BY COLOR   APPearance HAZY (A) CLEAR   Specific Gravity, Urine 1.026 1.005 - 1.030   pH 5.0 5.0 - 8.0   Glucose, UA >=500 (A) NEGATIVE mg/dL   Hgb urine dipstick LARGE (A) NEGATIVE   Bilirubin Urine NEGATIVE NEGATIVE   Ketones, ur 80 (A) NEGATIVE mg/dL   Protein, ur 30 (A) NEGATIVE mg/dL   Nitrite NEGATIVE NEGATIVE   Leukocytes,Ua NEGATIVE NEGATIVE   RBC / HPF 0-5 0 - 5 RBC/hpf   WBC, UA 6-10 0 - 5 WBC/hpf   Bacteria, UA RARE (A) NONE SEEN   Squamous Epithelial / LPF 11-20 0 - 5   Mucus PRESENT    Hyaline Casts, UA PRESENT     Comment: Performed at [facility], [address] [place], [place] [address]  CBG monitoring, ED     Status: Abnormal   Collection Time: 09/14/20 10:53 PM  Result Value Ref Range   Glucose-Capillary 253 (H) 70 - 99 mg/dL     Comment: Glucose reference range applies only to samples taken after fasting for at least 8 hours.  Resp Panel by RT-PCR (Flu A&B, Covid) Nasopharyngeal Swab     Status: None   Collection Time: 09/14/20 11:15 PM   Specimen: Nasopharyngeal Swab; Nasopharyngeal(NP) swabs in vial transport medium  Result Value Ref Range   SARS Coronavirus 2 by RT PCR NEGATIVE NEGATIVE    Comment: (NOTE) SARS-CoV-2 target nucleic acids are NOT DETECTED.  The SARS-CoV-2 RNA is generally detectable in upper respiratory specimens during the acute phase of infection. The lowest concentration of SARS-CoV-2 viral copies this assay can detect is 138 copies/mL. A negative result does not preclude SARS-Cov-2 infection and should not be used as the sole basis for treatment or other patient management decisions. A negative result may occur with  improper specimen collection/handling, submission of specimen other than nasopharyngeal swab, presence of viral mutation(s) within  the areas targeted by this assay, and inadequate number of viral copies(<138 copies/mL). A negative result must be combined with clinical observations, patient history, and epidemiological information. The expected result is Negative.  Fact Sheet for Patients:  EntrepreneurPulse.com.au  Fact Sheet for Healthcare Providers:  IncredibleEmployment.be  This test is no t yet approved or cleared by the [place] FDA and  has been authorized for detection and/or diagnosis of SARS-CoV-2 by FDA under an Emergency Use Authorization (EUA). This EUA will remain  in effect (meaning this test can be used) for the duration of the COVID-19 declaration under Section 564(b)(1) of the Act, 21 U.S.C.section 360bbb-3(b)(1), unless the authorization is terminated  or revoked sooner.       Influenza A by PCR NEGATIVE NEGATIVE   Influenza B by PCR NEGATIVE NEGATIVE    Comment: (NOTE) The Xpert Xpress SARS-CoV-2/FLU/RSV  plus assay is intended as an aid in the diagnosis of influenza from Nasopharyngeal swab specimens and should not be used as a sole basis for treatment. Nasal washings and aspirates are unacceptable for Xpert Xpress SARS-CoV-2/FLU/RSV testing.  Fact Sheet for Patients: EntrepreneurPulse.com.au  Fact Sheet for Healthcare Providers: IncredibleEmployment.be  This test is not yet approved or cleared by the [place] FDA and has been authorized for detection and/or diagnosis of SARS-CoV-2 by FDA under an Emergency Use Authorization (EUA). This EUA will remain in effect (meaning this test can be used) for the duration of the COVID-19 declaration under Section 564(b)(1) of the Act, 21 U.S.C. section 360bbb-3(b)(1), unless the authorization is terminated or revoked.  Performed at [facility], [address] [place], [place] [address]   Basic metabolic panel     Status: Abnormal   Collection Time: 09/15/20  5:33 AM  Result Value Ref Range   Sodium 134 (L) 135 - 145 mmol/L   Potassium 3.8 3.5 - 5.1 mmol/L   Chloride 102 98 - 111 mmol/L   CO2 23 22 - 32 mmol/L   Glucose, Bld 309 (H) 70 - 99 mg/dL    Comment: Glucose reference range applies only to samples taken after fasting for at least 8 hours.   BUN 16 8 - 23 mg/dL   Creatinine, Ser 0.55 0.44 - 1.00 mg/dL   Calcium 9.2 8.9 - 10.3 mg/dL   GFR, Estimated >60 >60 mL/min    Comment: (NOTE) Calculated using the CKD-EPI Creatinine Equation (2021)    Anion gap 9 5 - 15    Comment: Performed at [facility], [address] [place], [place] [address]  CBC     Status: None   Collection Time: 09/15/20  5:33 AM  Result Value Ref Range   WBC 5.7 4.0 - 10.5 K/uL   RBC 4.72 3.87 - 5.11 MIL/uL   Hemoglobin 13.6 12.0 - 15.0 g/dL   HCT 39.4 36.0 - 46.0 %   MCV 83.5 80.0 - 100.0 fL   MCH 28.8 26.0 - 34.0 pg   MCHC 34.5 30.0 - 36.0 g/dL   RDW 13.1 11.5 - 15.5 %   Platelets 158 150 -  400 K/uL   nRBC 0.0 0.0 - 0.2 %    Comment: Performed at [facility], [address] [place], [place] [address]  Glucose, capillary     Status: Abnormal   Collection Time: 09/15/20  8:08 AM  Result Value Ref Range   Glucose-Capillary 323 (H) 70 - 99 mg/dL    Comment: Glucose reference range applies only to samples taken after fasting for  at least 8 hours.   Comment 1 Notify RN     Current Facility-Administered Medications  Medication Dose Route Frequency Provider Last Rate Last Admin   0.9 %  sodium chloride infusion   Intravenous Continuous Agbata, Tochukwu, MD 100 mL/hr at 09/15/20 0022 New Bag at 09/15/20 0022   acetaminophen (TYLENOL) tablet 500 mg  500 mg Oral Q6H PRN Agbata, Tochukwu, MD       apixaban (ELIQUIS) tablet 5 mg  5 mg Oral BID Agbata, Tochukwu, MD   5 mg at 09/15/20 0905   dexamethasone (DECADRON) injection 4 mg  4 mg Intravenous Q6H Agbata, Tochukwu, MD   4 mg at 09/15/20 0506   insulin aspart (novoLOG) injection 0-15 Units  0-15 Units Subcutaneous TID WC Agbata, Tochukwu, MD       insulin aspart (novoLOG) injection 0-5 Units  0-5 Units Subcutaneous QHS Merlyn Lot, MD   2 Units at 09/14/20 2259   ondansetron (ZOFRAN) tablet 4 mg  4 mg Oral Q6H PRN Agbata, Tochukwu, MD       Or   ondansetron (ZOFRAN) injection 4 mg  4 mg Intravenous Q6H PRN Agbata, Tochukwu, MD       ramipril (ALTACE) capsule 15 mg  15 mg Oral Daily Agbata, Tochukwu, MD   15 mg at 09/15/20 0905    Psychiatric Specialty Exam: MENTAL STATUS EXAM:  Appearance:  AAF, appearing stated age, appears well-nourished;  wearing appropriate to situation hospital clothes, with fair grooming and hygiene. Normal level of alertness and appropriate facial expression.  Attitude/Behavior: calm, cooperative, engaging with appropriate eye contact.  Motor: WNL; dyskinesias not evident.   Speech: spontaneous, clear, coherent, normal comprehension.  Mood: euthymic, " I am okay".  Affect:  appropriately-reactive, full range.  Thought process: patient appears coherent, organized, goal-directed, associations are appropriate.  Thought content: patient denies suicidal thoughts, denies homicidal thoughts. Possible delusion - strong belief that Dr told her to stop all the medications.  Thought perception: patient reports occasional possible auditory hallucinations, no visual hallucinations, no illusions, no depersonalizations. Did not appear internally stimulated.  Cognition: patient is alert and oriented in self, place, date.  Insight: limited, in regards of understanding of presence, nature, cause, and significance of mental or emotional problem.  Judgement: limited, in regards of ability to make good decisions concerning the appropriate thing to do in various situations, including ability to form opinions regarding their mental health condition.    Sleep  Sleep: No data recorded  Physical Exam: Physical Exam ROS Blood pressure (!) 144/59, pulse (!) 45, temperature 97.6 F (36.4 C), temperature source Oral, resp. rate 18, height 5\' 6"  (1.676 m), weight 79.4 kg, SpO2 100 %. Body mass index is 28.25 kg/m.  Treatment Plan Summary:  87 y.o. female patient with psychiatric history of anxiety and medical history of DM, DVT, GERD, HTN, admitted with altered mental status. Psychiatry consulted for assistance. During my assessment, pt is calm, cooperative, and mostly logical. Patient denies suicidal, homicidal thoughts; patient does not appear to be psychotic, gravely disabled or at acute risk of harming self or others.  Patient`s daughter reported waxing and waning confusion in a day, altered sensorium, paranoia, impaired attention. All the above are symptoms of delirium and per daughter are now resolving. The reason for delirium could be toxic metabolic changes or organic substrate (brain tumor).  Patient reports occasionally hearing "voices" of deceased husband and son and  considers it to be a part of her grief, which could be correct, also could be  due to developing dementia or ongoing delirium.   IMPRESSION: Delirium Minor neurocognitive disorder Generalized anxiety disorder per history   RECOMMENDATIONS:  -No indication for inpatient psychiatric hospitalization.  --As with all hospitalized patients, would recommend delirium precautions: Minimize use of benzodiazepines, anticholinergics, and opiates, to the extent possible, as these can worsen mentation. Utilize non-pharmacological interventions including frequent reorientation, maintaining day/night distinction (blinds closed during night, open during day), minimizing excessive stimulation, staff continuity, etc.   -restart/continue outpatient psych medication - Zoloft 25mg  PO daily for anxiety.  -Neurology consult due to head CT findings.   Disposition: No evidence of imminent risk to self or others at present.   Patient does not meet criteria for psychiatric inpatient admission. Supportive therapy provided about ongoing stressors. Discussed crisis plan, support from social network, calling 911, coming to the Emergency Department, and calling Suicide Hotline.  Larita Fife, MD 09/15/2020 10:04 AM

## 2020-09-17 ENCOUNTER — Other Ambulatory Visit: Payer: Self-pay | Admitting: Family Medicine

## 2020-09-17 DIAGNOSIS — I10 Essential (primary) hypertension: Secondary | ICD-10-CM

## 2020-09-17 LAB — HEMOGLOBIN A1C
Hgb A1c MFr Bld: 10.2 % — ABNORMAL HIGH (ref 4.8–5.6)
Mean Plasma Glucose: 246 mg/dL

## 2020-09-17 NOTE — Telephone Encounter (Signed)
Pt has apt on 09/20/2020

## 2020-09-17 NOTE — Telephone Encounter (Signed)
Patient's daughter will come to visit this week and discuss.

## 2020-09-17 NOTE — Telephone Encounter (Signed)
Requested medication (s) are due for refill today: yes   Requested medication (s) are on the active medication list: yes   Last refill: 06/17/2020  Future visit scheduled: no  Notes to clinic: Patient was due for follow up on 06/19/2020 Review for refill    Requested Prescriptions  Pending Prescriptions Disp Refills   amLODipine (NORVASC) 10 MG tablet [Pharmacy Med Name: AMLODIPINE BESYLATE 10 MG TAB] 90 tablet 1    Sig: TAKE 1 TABLET BY MOUTH EVERY DAY      Cardiovascular:  Calcium Channel Blockers Failed - 09/17/2020  1:33 AM      Failed - Last BP in normal range    BP Readings from Last 1 Encounters:  09/15/20 (!) 153/66          Passed - Valid encounter within last 6 months    Recent Outpatient Visits           6 months ago Type 2 diabetes mellitus with hyperglycemia, with long-term current use of insulin ([place])   [place], Megan P, DO   8 months ago Type 2 diabetes mellitus with hyperglycemia, with long-term current use of insulin ([place])   [place], Megan P, DO   10 months ago Type 2 diabetes mellitus with hyperglycemia, with long-term current use of insulin ([place])   [place], Megan P, DO   11 months ago Primary insomnia   [place], Megan P, DO   1 year ago Type 2 diabetes mellitus with hyperglycemia, with long-term current use of insulin ([place])   [place], Megan P, DO       Future Appointments             In 1 month [place], [facility]

## 2020-09-20 ENCOUNTER — Encounter: Payer: Self-pay | Admitting: Family Medicine

## 2020-09-20 ENCOUNTER — Other Ambulatory Visit: Payer: Self-pay

## 2020-09-20 ENCOUNTER — Ambulatory Visit (INDEPENDENT_AMBULATORY_CARE_PROVIDER_SITE_OTHER): Payer: Medicare HMO | Admitting: Family Medicine

## 2020-09-20 VITALS — BP 120/70 | HR 109 | Temp 98.1°F | Wt 164.2 lb

## 2020-09-20 DIAGNOSIS — I480 Paroxysmal atrial fibrillation: Secondary | ICD-10-CM

## 2020-09-20 DIAGNOSIS — I10 Essential (primary) hypertension: Secondary | ICD-10-CM

## 2020-09-20 DIAGNOSIS — Z794 Long term (current) use of insulin: Secondary | ICD-10-CM

## 2020-09-20 DIAGNOSIS — E782 Mixed hyperlipidemia: Secondary | ICD-10-CM

## 2020-09-20 DIAGNOSIS — F0391 Unspecified dementia with behavioral disturbance: Secondary | ICD-10-CM | POA: Diagnosis not present

## 2020-09-20 DIAGNOSIS — F5101 Primary insomnia: Secondary | ICD-10-CM | POA: Diagnosis not present

## 2020-09-20 DIAGNOSIS — F322 Major depressive disorder, single episode, severe without psychotic features: Secondary | ICD-10-CM

## 2020-09-20 DIAGNOSIS — F29 Unspecified psychosis not due to a substance or known physiological condition: Secondary | ICD-10-CM | POA: Diagnosis not present

## 2020-09-20 DIAGNOSIS — D329 Benign neoplasm of meninges, unspecified: Secondary | ICD-10-CM

## 2020-09-20 DIAGNOSIS — E1165 Type 2 diabetes mellitus with hyperglycemia: Secondary | ICD-10-CM | POA: Diagnosis not present

## 2020-09-20 DIAGNOSIS — F03918 Unspecified dementia, unspecified severity, with other behavioral disturbance: Secondary | ICD-10-CM

## 2020-09-20 DIAGNOSIS — Z111 Encounter for screening for respiratory tuberculosis: Secondary | ICD-10-CM

## 2020-09-20 DIAGNOSIS — I700 Atherosclerosis of aorta: Secondary | ICD-10-CM | POA: Diagnosis not present

## 2020-09-20 DIAGNOSIS — R410 Disorientation, unspecified: Secondary | ICD-10-CM

## 2020-09-20 DIAGNOSIS — I2510 Atherosclerotic heart disease of native coronary artery without angina pectoris: Secondary | ICD-10-CM

## 2020-09-20 DIAGNOSIS — Z7189 Other specified counseling: Secondary | ICD-10-CM

## 2020-09-20 DIAGNOSIS — E440 Moderate protein-calorie malnutrition: Secondary | ICD-10-CM

## 2020-09-20 DIAGNOSIS — F411 Generalized anxiety disorder: Secondary | ICD-10-CM

## 2020-09-20 NOTE — Patient Instructions (Signed)
STOP amlodipine, ramipril 10, tresiba, trulicity and sertraline  Take your ozempic 0.25mg  on Thursdays  Drink 1 ensure daily  Come back here in 2 weeks.

## 2020-09-20 NOTE — Progress Notes (Signed)
BP 120/70   Pulse (!) 109   Temp 98.1 F (36.7 C)   Wt 164 lb 3.2 oz (74.5 kg)   LMP  (LMP Unknown)   SpO2 96%   BMI 26.50 kg/m    Subjective:    Patient ID: Daisy Watkins, female    DOB: 08/26/1932, 87 y.o.   MRN: [id number]  HPI: Daisy Watkins is a 87 y.o. female  Chief Complaint  Patient presents with   Hospitalization Follow-up    Patient was recently admitted to hospital for memory concerns    Transition of [facility] Follow up.   Hospital/Facility: [facility] D/C Physician: Dr. Posey Pronto D/C Date: 09/15/20  Records Requested: 09/20/20 Records Received: 09/20/20 Records Reviewed: 09/20/20  Diagnoses on Discharge:   AMS (altered mental status) Active Problems:   Type 2 diabetes mellitus with hyperglycemia ([place])   Benign essential hypertension   Paroxysmal A-fib (HCC)   Major depression   History of DVT (deep vein thrombosis)   Meningioma (HCC)   Confusion   Date of interactive Contact within 48 hours of discharge: NOT DONE  Date of 7 day or 14 day face-to-face visit:  09/20/20  within 7 days  Outpatient Encounter Medications as of 09/20/2020  Medication Sig Note   acetaminophen (TYLENOL) 500 MG tablet Take 500 mg every 6 (six) hours as needed by mouth. As needed 02/13/2017: As needed.    Semaglutide, 1 MG/DOSE, 4 MG/3ML SOPN Inject 0.25 mg into the skin once a week for 28 days, THEN 0.5 mg once a week for 28 days, THEN 1 mg once a week for 28 days.    Semaglutide, 1 MG/DOSE, 4 MG/3ML SOPN Inject 1 mg into the skin once a week.    apixaban (ELIQUIS) 5 MG TABS tablet Take 1 tablet (5 mg total) by mouth 2 (two) times daily.    LORazepam (ATIVAN) 0.5 MG tablet Take 1 tablet (0.5 mg total) by mouth at bedtime as needed.    ramipril (ALTACE) 5 MG capsule Take 1 capsule (5 mg total) by mouth daily.    TRUETRACK TEST test strip  (Patient not taking: Reported on 09/20/2020)    [DISCONTINUED] amLODipine (NORVASC) 10 MG tablet TAKE 1 TABLET BY MOUTH EVERY DAY (Patient not taking:  Reported on 09/20/2020)    [DISCONTINUED] ELIQUIS 5 MG TABS tablet TAKE 1 TABLET (5 MG TOTAL) BY MOUTH 2 (TWO) TIMES DAILY. NEED OFFICE VISIT FOR FURTHER REFILLS    [DISCONTINUED] LORazepam (ATIVAN) 1 MG tablet Take 1 mg by mouth at bedtime as needed.    [DISCONTINUED] ramipril (ALTACE) 10 MG capsule TAKE 1 TABLET BY MOUTH ONCE DAILY. TAKE WITH 5MG  FOR TOTAL OF 15MG  DAILY (Patient not taking: Reported on 09/20/2020)    [DISCONTINUED] ramipril (ALTACE) 5 MG capsule TAKE ONE CAPSULE BY MOUTH DAILY WITH 10 MG CAPSULE FOR A TOTAL OF 15 MG DAILY (Patient not taking: Reported on 09/20/2020)    [DISCONTINUED] sertraline (ZOLOFT) 50 MG tablet TAKE 1 TABLET BY MOUTH EVERY DAY (Patient not taking: Reported on 09/20/2020)    [DISCONTINUED] TRESIBA 100 UNIT/ML SOLN INJECT 24 UNITS INTO THE SKIN AT BEDTIME. (Patient not taking: Reported on 09/20/2020)    [DISCONTINUED] TRULICITY 1.5 LZ/7.6BH SOPN INJECT 1.5 MG INTO THE SKIN ONCE A WEEK. (Patient not taking: Reported on 09/20/2020)    No facility-administered encounter medications on file as of 09/20/2020.  Per Hospitalist:"Hospital Course: Summary of her active problems in the hospital is as following.   Acute metabolic encephalopathy. Presents with confusion.  Given with IV hydration Mentation improved and appears to be back to baseline. Recommend continuing hydration outpatient.   Meningioma. Neurosurgery was consulted. MRI brain shows vasogenic edema as well. Patient was also initiated on Decadron. At present neurosurgery does not think that the surgery is required for this condition. Also recommend radiation oncology referral which we will be placed. No steroid therapy on discharge or currently right now but No concern for seizures right now.   Concern for mood disorder. Likely dementia no delirium. Psychiatry was consulted no current therapy recommended. Outpatient follow-up with PCP for further secondary dementia work-up.   History of  DVT Paroxysmal A. fib Continue Eliquis. Heart rate currently controlled.   Type 2 diabetes mellitus. Current modified diet. Continue current regimen.   Essential hypertension. Continue ramipril.   Patient was seen by physical therapy, who recommended Home Health. On the day of the discharge the patient's vitals were stable, and no other new acute medical condition were reported. The patient was felt safe to be discharge at Home with Home health."  Diagnostic Tests Reviewed:  CLINICAL DATA:  Mental status change   EXAM: CT HEAD WITHOUT CONTRAST   TECHNIQUE: Contiguous axial images were obtained from the base of the skull through the vertex without intravenous contrast.   COMPARISON:  None.   FINDINGS: Brain: No acute territorial infarction or hemorrhage is visualized. Moderate atrophy. Minimal chronic small vessel ischemic change of the white matter. Low-density edema within the left temporal lobe. 2 x 2 cm partially calcified mass, probably extra-axial and abutting the medial temporal lobe. This appears to be associated with left cavernous sinus, series 3, image 6.   Vascular: No hyperdense vessels.  Carotid vascular calcification   Skull: Normal. Negative for fracture or focal lesion.   Sinuses/Orbits: No acute finding.   Other: None   IMPRESSION: 1. 2 cm partially calcified mass within the left middle cranial fossa, potentially associated with the left cavernous sinus. Findings could be secondary to meningioma, though giant aneurysm is also in the differential. There is edema within the left temporal lobe. Recommend further evaluation with MRI. 2. Atrophy and chronic small vessel ischemic changes of the white matter.  CLINICAL DATA:  Encephalopathy   EXAM: MRA HEAD WITHOUT CONTRAST   TECHNIQUE: Angiographic images of the Circle of Willis were acquired using MRA technique without intravenous contrast.   COMPARISON:  No pertinent prior exam.    FINDINGS: POSTERIOR CIRCULATION:   --Vertebral arteries: Normal   --Inferior cerebellar arteries: Normal.   --Basilar artery: Normal.   --Superior cerebellar arteries: Normal.   --Posterior cerebral arteries: Normal.   ANTERIOR CIRCULATION:   --Intracranial internal carotid arteries: Normal.   --Anterior cerebral arteries (ACA): Normal.   --Middle cerebral arteries (MCA): Normal.   ANATOMIC VARIANTS: Both posterior communicating arteries are patent.   Other: None.   IMPRESSION: Normal intracranial MRA.      CLINICAL DATA:  Altered mental status   EXAM: MRI HEAD WITHOUT AND WITH CONTRAST   TECHNIQUE: Multiplanar, multiecho pulse sequences of the brain and surrounding structures were obtained without and with intravenous contrast.   CONTRAST:  7.58mL GADAVIST GADOBUTROL 1 MMOL/ML IV SOLN   COMPARISON:  None.   FINDINGS: Brain: No acute infarct, mass effect or extra-axial collection. No acute or chronic hemorrhage. Hyperintense T2-weighted signal within the anterior left temporal lobe. The midline structures are normal. There is a contrast-enhancing mass in the left paraclinoid region, which is dural based. It measures 3.1 x 2.3 cm.   Vascular:  The above-described contrast-enhancing mass extends into the left cavernous sinus. The left internal carotid artery flow void is preserved.   Skull and upper cervical spine: Normal calvarium and skull base. Visualized upper cervical spine and soft tissues are normal.   Sinuses/Orbits:No paranasal sinus fluid levels or advanced mucosal thickening. No mastoid or middle ear effusion. Normal orbits.   IMPRESSION: 1. No acute intracranial abnormality. 2. Left paraclinoid meningioma with vasogenic edema in the left anterior temporal lobe. There is likely invasion of the left cavernous sinus.  Disposition: Home  Consults: Neurosurgery, psychiatry  Discharge Instructions:  Follow up here within 1-2  weeks  Disease/illness Education: Discussed today  Home Health/Community Services Discussions/Referrals: Placed today  Establishment or re-establishment of referral orders for community resources: Placed today  Discussion with other health care providers: None  Assessment and Support of treatment regimen adherence: Guarded  Appointments Coordinated with:  Patient and daughter  Education for self-management, independent living, and ADLs: Discussed today  Maisy has not been doing well since getting out of the hospital. She has not been taking any of her medicine. She knows that the people at the hospital told her that she needed to take her medicine, but she didn't. She has been out of her lorazepam for months. She has not been taking her insulin. She has not been eating. She denies that there is anything wrong.   DIABETES Hypoglycemic episodes:no Polydipsia/polyuria: no Visual disturbance: no Chest pain: no Paresthesias: no Glucose Monitoring: no Taking Insulin?: no Blood Pressure Monitoring: a few times a month Retinal Examination: Not up to Date Foot Exam: Up to Date Diabetic Education: Completed Pneumovax: Up to Date Influenza: Up to Date Aspirin: no  HYPERTENSION Hypertension status: controlled  Satisfied with current treatment? yes Duration of hypertension: chronic BP monitoring frequency:  not checking BP medication side effects:  no Medication compliance: poor compliance Previous BP meds:ramipril, amlodipine Aspirin: no Recurrent headaches: no Visual changes: no Palpitations: no Dyspnea: no Chest pain: no Lower extremity edema: no Dizzy/lightheaded: no  ANXIETY/STRESS Duration: chronic Status:exacerbated Anxious mood: yes  Excessive worrying: yes Irritability: no  Sweating: no Nausea: no Palpitations:no Hyperventilation: no Panic attacks: no Agoraphobia: no  Obscessions/compulsions: no Depressed mood: yes Depression screen [facility] 2/9 09/20/2020  03/21/2020 12/23/2019 11/07/2019 04/25/2019  Decreased Interest 0 0 - 0 3  Down, Depressed, Hopeless 0 2 - 3 3  PHQ - 2 Score 0 2 - 3 6  Altered sleeping 1 2 0 0 3  Tired, decreased energy 0 1 1 0 3  Change in appetite 1 1 0 0 3  Feeling bad or failure about yourself  0 0 1 0 0  Trouble concentrating 0 0 0 0 0  Moving slowly or fidgety/restless 0 0 0 0 0  Suicidal thoughts 0 0 0 0 0  PHQ-9 Score 2 6 - 3 15  Difficult doing work/chores Not difficult at all Not difficult at all - Not difficult at all Very difficult  Some recent data might be hidden   Anhedonia: no Weight changes: no Insomnia: yes hard to fall asleep  Hypersomnia: no Fatigue/loss of energy: yes Feelings of worthlessness: yes Feelings of guilt: yes Impaired concentration/indecisiveness: no Suicidal ideations: no  Crying spells: yes Recent Stressors/Life Changes: yes   Relationship problems: no   Family stress: yes     Financial stress: no    Job stress: no    Recent death/loss: yes  INSOMNIA Duration: chronic Satisfied with sleep quality: yes Difficulty falling asleep: no Difficulty staying  asleep: no Waking a few hours after sleep onset: no Early morning awakenings: no Daytime hypersomnolence: no Wakes feeling refreshed: no Good sleep hygiene: no Apnea: no Snoring: no Depressed/anxious mood: yes Recent stress: yes Restless legs/nocturnal leg cramps: no Chronic pain/arthritis: no History of sleep study: no Treatments attempted:  ativan, melatonin, uinsom, and benadryl   Relevant past medical, surgical, family and social history reviewed and updated as indicated. Interim medical history since our last visit reviewed. Allergies and medications reviewed and updated.  Review of Systems  Constitutional:  Positive for activity change, appetite change and unexpected weight change. Negative for chills, diaphoresis, fatigue and fever.  HENT: Negative.    Respiratory: Negative.    Cardiovascular: Negative.    Gastrointestinal: Negative.   Genitourinary: Negative.   Musculoskeletal: Negative.   Skin: Negative.   Neurological: Negative.   Psychiatric/Behavioral:  Positive for confusion, dysphoric mood and sleep disturbance. Negative for agitation, behavioral problems, decreased concentration, hallucinations, self-injury and suicidal ideas. The patient is nervous/anxious. The patient is not hyperactive.    Per HPI unless specifically indicated above     Objective:    BP 120/70   Pulse (!) 109   Temp 98.1 F (36.7 C)   Wt 164 lb 3.2 oz (74.5 kg)   LMP  (LMP Unknown)   SpO2 96%   BMI 26.50 kg/m   Wt Readings from Last 3 Encounters:  09/20/20 164 lb 3.2 oz (74.5 kg)  09/14/20 175 lb (79.4 kg)  03/21/20 189 lb 3.2 oz (85.8 kg)    Physical Exam Vitals and nursing note reviewed.  Constitutional:      General: She is not in acute distress.    Appearance: Normal appearance. She is not ill-appearing, toxic-appearing or diaphoretic.     Comments: Temporal wasting  HENT:     Head: Normocephalic and atraumatic.     Right Ear: External ear normal.     Left Ear: External ear normal.     Nose: Nose normal.     Mouth/Throat:     Mouth: Mucous membranes are moist.     Pharynx: Oropharynx is clear.  Eyes:     General: No scleral icterus.       Right eye: No discharge.        Left eye: No discharge.     Extraocular Movements: Extraocular movements intact.     Conjunctiva/sclera: Conjunctivae normal.     Pupils: Pupils are equal, round, and reactive to light.  Cardiovascular:     Rate and Rhythm: Normal rate and regular rhythm.     Pulses: Normal pulses.     Heart sounds: Normal heart sounds. No murmur heard.   No friction rub. No gallop.  Pulmonary:     Effort: Pulmonary effort is normal. No respiratory distress.     Breath sounds: Normal breath sounds. No stridor. No wheezing, rhonchi or rales.  Chest:     Chest wall: No tenderness.  Musculoskeletal:        General: Normal range of  motion.     Cervical back: Normal range of motion and neck supple.  Skin:    General: Skin is warm and dry.     Capillary Refill: Capillary refill takes less than 2 seconds.     Coloration: Skin is not jaundiced or pale.     Findings: No bruising, erythema, lesion or rash.  Neurological:     General: No focal deficit present.     Mental Status: She is alert and oriented to person,  place, and time. Mental status is at baseline.  Psychiatric:        Mood and Affect: Mood normal.        Behavior: Behavior normal.        Thought Content: Thought content normal.        Judgment: Judgment normal.    Results for orders placed or performed during the hospital encounter of 09/14/20  Resp Panel by RT-PCR (Flu A&B, Covid) Nasopharyngeal Swab   Specimen: Nasopharyngeal Swab; Nasopharyngeal(NP) swabs in vial transport medium  Result Value Ref Range   SARS Coronavirus 2 by RT PCR NEGATIVE NEGATIVE   Influenza A by PCR NEGATIVE NEGATIVE   Influenza B by PCR NEGATIVE NEGATIVE  Comprehensive metabolic panel  Result Value Ref Range   Sodium 135 135 - 145 mmol/L   Potassium 3.9 3.5 - 5.1 mmol/L   Chloride 99 98 - 111 mmol/L   CO2 25 22 - 32 mmol/L   Glucose, Bld 286 (H) 70 - 99 mg/dL   BUN 14 8 - 23 mg/dL   Creatinine, Ser 0.76 0.44 - 1.00 mg/dL   Calcium 9.8 8.9 - 10.3 mg/dL   Total Protein 7.3 6.5 - 8.1 g/dL   Albumin 4.1 3.5 - 5.0 g/dL   AST 20 15 - 41 U/L   ALT 22 0 - 44 U/L   Alkaline Phosphatase 88 38 - 126 U/L   Total Bilirubin 2.0 (H) 0.3 - 1.2 mg/dL   GFR, Estimated >60 >60 mL/min   Anion gap 11 5 - 15  Ethanol  Result Value Ref Range   Alcohol, Ethyl (B) <55 <73 mg/dL  Salicylate level  Result Value Ref Range   Salicylate Lvl <2.2 (L) 7.0 - 30.0 mg/dL  Acetaminophen level  Result Value Ref Range   Acetaminophen (Tylenol), Serum <10 (L) 10 - 30 ug/mL  cbc  Result Value Ref Range   WBC 6.1 4.0 - 10.5 K/uL   RBC 5.02 3.87 - 5.11 MIL/uL   Hemoglobin 14.6 12.0 - 15.0 g/dL    HCT 42.7 36.0 - 46.0 %   MCV 85.1 80.0 - 100.0 fL   MCH 29.1 26.0 - 34.0 pg   MCHC 34.2 30.0 - 36.0 g/dL   RDW 13.2 11.5 - 15.5 %   Platelets 172 150 - 400 K/uL   nRBC 0.0 0.0 - 0.2 %  Urine Drug Screen, Qualitative  Result Value Ref Range   Tricyclic, Ur Screen NONE DETECTED NONE DETECTED   Amphetamines, Ur Screen NONE DETECTED NONE DETECTED   MDMA (Ecstasy)Ur Screen NONE DETECTED NONE DETECTED   Cocaine Metabolite,Ur [place] NONE DETECTED NONE DETECTED   Opiate, Ur Screen NONE DETECTED NONE DETECTED   Phencyclidine (PCP) Ur S NONE DETECTED NONE DETECTED   Cannabinoid 50 Ng, Ur [place] NONE DETECTED NONE DETECTED   Barbiturates, Ur Screen NONE DETECTED NONE DETECTED   Benzodiazepine, Ur Scrn NONE DETECTED NONE DETECTED   Methadone Scn, Ur NONE DETECTED NONE DETECTED  Urinalysis, Routine w reflex microscopic Urine, Clean Catch  Result Value Ref Range   Color, Urine AMBER (A) YELLOW   APPearance HAZY (A) CLEAR   Specific Gravity, Urine 1.026 1.005 - 1.030   pH 5.0 5.0 - 8.0   Glucose, UA >=500 (A) NEGATIVE mg/dL   Hgb urine dipstick LARGE (A) NEGATIVE   Bilirubin Urine NEGATIVE NEGATIVE   Ketones, ur 80 (A) NEGATIVE mg/dL   Protein, ur 30 (A) NEGATIVE mg/dL   Nitrite NEGATIVE NEGATIVE   Leukocytes,Ua NEGATIVE NEGATIVE  RBC / HPF 0-5 0 - 5 RBC/hpf   WBC, UA 6-10 0 - 5 WBC/hpf   Bacteria, UA RARE (A) NONE SEEN   Squamous Epithelial / LPF 11-20 0 - 5   Mucus PRESENT    Hyaline Casts, UA PRESENT   Hemoglobin A1c  Result Value Ref Range   Hgb A1c MFr Bld 10.2 (H) 4.8 - 5.6 %   Mean Plasma Glucose 246 mg/dL  Basic metabolic panel  Result Value Ref Range   Sodium 134 (L) 135 - 145 mmol/L   Potassium 3.8 3.5 - 5.1 mmol/L   Chloride 102 98 - 111 mmol/L   CO2 23 22 - 32 mmol/L   Glucose, Bld 309 (H) 70 - 99 mg/dL   BUN 16 8 - 23 mg/dL   Creatinine, Ser 0.55 0.44 - 1.00 mg/dL   Calcium 9.2 8.9 - 10.3 mg/dL   GFR, Estimated >60 >60 mL/min   Anion gap 9 5 - 15  CBC  Result Value Ref  Range   WBC 5.7 4.0 - 10.5 K/uL   RBC 4.72 3.87 - 5.11 MIL/uL   Hemoglobin 13.6 12.0 - 15.0 g/dL   HCT 39.4 36.0 - 46.0 %   MCV 83.5 80.0 - 100.0 fL   MCH 28.8 26.0 - 34.0 pg   MCHC 34.5 30.0 - 36.0 g/dL   RDW 13.1 11.5 - 15.5 %   Platelets 158 150 - 400 K/uL   nRBC 0.0 0.0 - 0.2 %  Glucose, capillary  Result Value Ref Range   Glucose-Capillary 323 (H) 70 - 99 mg/dL   Comment 1 Notify RN   Glucose, capillary  Result Value Ref Range   Glucose-Capillary 332 (H) 70 - 99 mg/dL   Comment 1 Notify RN   CBG monitoring, ED  Result Value Ref Range   Glucose-Capillary 253 (H) 70 - 99 mg/dL      Assessment & Plan:   Problem List Items Addressed This Visit       Cardiovascular and Mediastinum   Aortic atherosclerosis ([place]) (Chronic)    Will try to keep her BP, sugars and cholesterol under good control. Continue to monitor. Call with any concerns.        Relevant Medications   ramipril (ALTACE) 5 MG capsule   apixaban (ELIQUIS) 5 MG TABS tablet   Other Relevant Orders   Ambulatory referral to [place] Referral to [place]   Coronary artery disease (Chronic)    Will try to keep her BP, sugars and cholesterol under good control. Continue to monitor. Call with any concerns.        Relevant Medications   ramipril (ALTACE) 5 MG capsule   apixaban (ELIQUIS) 5 MG TABS tablet   Benign essential hypertension    Has not been taking any of her medicine and her BP is under good control today. Will only leave her on her 5mg  of ramipril for renal protection. Call if going low- recheck 2 weeks. Call with any concerns. Labs drawn today.       Relevant Medications   ramipril (ALTACE) 5 MG capsule   apixaban (ELIQUIS) 5 MG TABS tablet   Other Relevant Orders   Ambulatory referral to [place] Referral to [facility]   Paroxysmal A-fib [facility]) - Primary    Regular rhythm today. Continue to monitor. Call with any concerns.         Relevant Medications   ramipril (ALTACE) 5 MG capsule  apixaban (ELIQUIS) 5 MG TABS tablet   Other Relevant Orders   Ambulatory referral to [place] Referral to [facility]     Endocrine   Type 2 diabetes mellitus with hyperglycemia ([place])    Has not been taking any of her medicine. Will stop her insulin and change to ozempic for ease of use and to avoid hypoglycemia. A1c in the hospital was 10.2.        Relevant Medications   Semaglutide, 1 MG/DOSE, 4 MG/3ML SOPN   Semaglutide, 1 MG/DOSE, 4 MG/3ML SOPN   ramipril (ALTACE) 5 MG capsule   Other Relevant Orders   Ambulatory referral to [place] Referral to [place]     Nervous and Auditory   Meningioma [facility])    Following with neurosurgery. They are not going to do surgery, to see radiation oncology next week. Continue to monitor. Call with any concerns.        Relevant Orders   Ambulatory referral to [place] Referral to [place]     Other   Hyperlipidemia, mixed    Will hold on medication at this time. Continue to monitor. Call with any concerns.        Relevant Medications   ramipril (ALTACE) 5 MG capsule   apixaban (ELIQUIS) 5 MG TABS tablet   Other Relevant Orders   Ambulatory referral to [place] Referral to [place], generalized    Not under good control. Does not want to restart her zoloft. Will continue ativan at bedtime as she has been on it for a very long time. Continue to monitor closely.       Relevant Medications   LORazepam (ATIVAN) 0.5 MG tablet   Major depression    Not under good control. Does not want to restart her zoloft. Will continue ativan at bedtime as she has been on it for a very long time. Continue to monitor closely.       Relevant Medications   LORazepam (ATIVAN) 0.5 MG tablet   Other Relevant Orders   Ambulatory referral to [place] Referral to [place] directive discussed with patient    Asked patient and her daughter if they would like to change directives- specifically filling out MOST form to keep her from going back to the hospital. She was not interested at this time. Will reassess next visit.        Insomnia    Under good control on current regimen. Continue current regimen. Continue to monitor. Call with any concerns. Refills given.         Relevant Orders   Ambulatory referral to [place] Referral to [place]   Psychosis in elderly with behavioral disturbance [facility])    Patient still does not seem like herself. Has not been eating. Daughter looking into assisted living. Not alone right now. Continue to monitor closely. Needs home health and CCM for help with potential placement and close monitoring including medication compliance.        Relevant Orders   Ambulatory referral to [place] Referral to [place] (altered mental status)    Patient still does not seem like herself. Has not been eating. Daughter looking into assisted living. Not alone right now. Continue to monitor closely. Needs home health and CCM for help  with potential placement and close monitoring including medication compliance.        Malnutrition of moderate degree (HCC)    Encouraged eating at least 1 ensure a day- Rx given. Continue to monitor closely. Labs drawn today.        Relevant Orders   Ambulatory referral to [place] Referral to [place]   Other Visit Diagnoses     Screening for tuberculosis       Will check labs for FL2.    Relevant Orders   QuantiFERON-TB Gold Plus        Follow up plan: Return in about 2 weeks (around 10/04/2020).   >1 hour spent with patient and her daughter today

## 2020-09-24 ENCOUNTER — Telehealth: Payer: Self-pay

## 2020-09-24 ENCOUNTER — Encounter: Payer: Self-pay | Admitting: Family Medicine

## 2020-09-24 ENCOUNTER — Ambulatory Visit: Payer: Medicare HMO | Admitting: Radiation Oncology

## 2020-09-24 DIAGNOSIS — E440 Moderate protein-calorie malnutrition: Secondary | ICD-10-CM | POA: Insufficient documentation

## 2020-09-24 MED ORDER — APIXABAN 5 MG PO TABS: 5.0000 mg | ORAL_TABLET | Freq: Two times a day (BID) | ORAL | 6 refills | Status: AC

## 2020-09-24 MED ORDER — RAMIPRIL 5 MG PO CAPS: 5.0000 mg | ORAL_CAPSULE | Freq: Every day | ORAL | 6 refills | Status: AC

## 2020-09-24 MED ORDER — SEMAGLUTIDE (1 MG/DOSE) 4 MG/3ML ~~LOC~~ SOPN: 1.0000 mg | PEN_INJECTOR | SUBCUTANEOUS | 1 refills | Status: AC

## 2020-09-24 MED ORDER — LORAZEPAM 0.5 MG PO TABS: 0.5000 mg | ORAL_TABLET | Freq: Every evening | ORAL | 0 refills | Status: DC | PRN

## 2020-09-24 MED ORDER — SEMAGLUTIDE (1 MG/DOSE) 4 MG/3ML ~~LOC~~ SOPN: PEN_INJECTOR | SUBCUTANEOUS | 0 refills | Status: AC

## 2020-09-24 NOTE — Assessment & Plan Note (Signed)
Will try to keep her BP, sugars and cholesterol under good control. Continue to monitor. Call with any concerns.

## 2020-09-24 NOTE — Assessment & Plan Note (Addendum)
Patient still does not seem like herself. Has not been eating. Daughter looking into assisted living. Not alone right now. Continue to monitor closely. Needs home health and CCM for help with potential placement and close monitoring including medication compliance.

## 2020-09-24 NOTE — Assessment & Plan Note (Signed)
Has not been taking any of her medicine and her BP is under good control today. Will only leave her on her 5mg  of ramipril for renal protection. Call if going low- recheck 2 weeks. Call with any concerns. Labs drawn today.

## 2020-09-24 NOTE — Assessment & Plan Note (Signed)
Encouraged eating at least 1 ensure a day- Rx given. Continue to monitor closely. Labs drawn today.

## 2020-09-24 NOTE — Assessment & Plan Note (Signed)
Not under good control. Does not want to restart her zoloft. Will continue ativan at bedtime as she has been on it for a very long time. Continue to monitor closely.

## 2020-09-24 NOTE — Assessment & Plan Note (Signed)
Under good control on current regimen. Continue current regimen. Continue to monitor. Call with any concerns. Refills given.   

## 2020-09-24 NOTE — Telephone Encounter (Signed)
Pt's daughter dropped off fl2 form, placed in back with cmas  Copied from [place] [phone]. Topic: General - Other >> Sep 24, 2020 10:09 AM Lennox Solders wrote: reason for CRM: Pt daughter sebena will drop off FL2  form to be completed. Pt seen dr Wynetta Emery on 09-20-2020

## 2020-09-24 NOTE — Telephone Encounter (Signed)
Spoke with patient daughter and was made aware of what information is needed in order for Dr.Johnson to complete the patient's FL2 form. Patient daughter [name] states the family is discussing moving the patient to a facility due to it has become a bit much on the family. Per Dr.Johnson patient would need to come in for lab work prior to having the paperwork completed. Patient is going to come in for a lab visit. Wednesday at 9:40am. [name] verbalized understanding and has no further questions at this time.

## 2020-09-24 NOTE — Assessment & Plan Note (Signed)
Following with neurosurgery. They are not going to do surgery, to see radiation oncology next week. Continue to monitor. Call with any concerns.

## 2020-09-24 NOTE — Assessment & Plan Note (Signed)
Regular rhythm today. Continue to monitor. Call with any concerns.

## 2020-09-24 NOTE — Assessment & Plan Note (Signed)
Patient still does not seem like herself. Has not been eating. Daughter looking into assisted living. Not alone right now. Continue to monitor closely. Needs home health and CCM for help with potential placement and close monitoring including medication compliance.

## 2020-09-24 NOTE — Assessment & Plan Note (Signed)
Will hold on medication at this time. Continue to monitor. Call with any concerns.

## 2020-09-24 NOTE — Assessment & Plan Note (Signed)
Has not been taking any of her medicine. Will stop her insulin and change to ozempic for ease of use and to avoid hypoglycemia. A1c in the hospital was 10.2.

## 2020-09-24 NOTE — Assessment & Plan Note (Signed)
Asked patient and her daughter if they would like to change directives- specifically filling out MOST form to keep her from going back to the hospital. She was not interested at this time. Will reassess next visit.

## 2020-09-25 NOTE — Telephone Encounter (Signed)
FL2 form filled out- top section left blank until we know where she is going.

## 2020-09-26 ENCOUNTER — Other Ambulatory Visit: Payer: Self-pay

## 2020-09-26 ENCOUNTER — Other Ambulatory Visit: Payer: Medicare HMO

## 2020-09-26 ENCOUNTER — Telehealth: Payer: Self-pay | Admitting: Licensed Clinical Social Worker

## 2020-09-26 DIAGNOSIS — Z111 Encounter for screening for respiratory tuberculosis: Secondary | ICD-10-CM

## 2020-09-26 NOTE — Telephone Encounter (Signed)
    Clinical Social Work  Chronic Care Management   Phone Outreach    09/26/2020 Name: Daisy Watkins MRN: [id number] DOB: Jul 04, 1932  Daisy Watkins is a 87 y.o. year old female who is a primary care patient of Valerie Roys, DO .   CCM LCSW reached out to patient today by phone to introduce self, assess needs and offer Care Management services and interventions.    Telephone outreach was unsuccessful  Plan:CCM LCSW will wait for return call. If no return call is received, Will reach out to patient again in the next 7 days .   Review of patient status, including review of consultants reports, relevant laboratory and other test results, and collaboration with appropriate care team members and the patient's provider was performed as part of comprehensive patient evaluation and provision of care management services.    Christa See, MSW, [email or web address] Phone [phone] 2:40 PM

## 2020-09-29 DIAGNOSIS — F329 Major depressive disorder, single episode, unspecified: Secondary | ICD-10-CM | POA: Diagnosis not present

## 2020-09-29 DIAGNOSIS — I10 Essential (primary) hypertension: Secondary | ICD-10-CM | POA: Diagnosis not present

## 2020-09-29 DIAGNOSIS — G4700 Insomnia, unspecified: Secondary | ICD-10-CM | POA: Diagnosis not present

## 2020-09-29 DIAGNOSIS — I480 Paroxysmal atrial fibrillation: Secondary | ICD-10-CM | POA: Diagnosis not present

## 2020-09-29 DIAGNOSIS — I700 Atherosclerosis of aorta: Secondary | ICD-10-CM | POA: Diagnosis not present

## 2020-09-29 DIAGNOSIS — E1165 Type 2 diabetes mellitus with hyperglycemia: Secondary | ICD-10-CM | POA: Diagnosis not present

## 2020-09-29 DIAGNOSIS — G9341 Metabolic encephalopathy: Secondary | ICD-10-CM | POA: Diagnosis not present

## 2020-09-29 DIAGNOSIS — E440 Moderate protein-calorie malnutrition: Secondary | ICD-10-CM | POA: Diagnosis not present

## 2020-09-29 DIAGNOSIS — E782 Mixed hyperlipidemia: Secondary | ICD-10-CM | POA: Diagnosis not present

## 2020-09-30 ENCOUNTER — Emergency Department: Payer: Medicare HMO

## 2020-09-30 ENCOUNTER — Encounter: Payer: Self-pay | Admitting: Emergency Medicine

## 2020-09-30 ENCOUNTER — Inpatient Hospital Stay
Admission: EM | Admit: 2020-09-30 | Discharge: 2020-11-20 | DRG: 884 | Disposition: A | Discharge status: Skilled Nursing Facility | Payer: Medicare HMO | Attending: Internal Medicine | Admitting: Internal Medicine

## 2020-09-30 ENCOUNTER — Other Ambulatory Visit: Payer: Self-pay

## 2020-09-30 DIAGNOSIS — G9341 Metabolic encephalopathy: Secondary | ICD-10-CM | POA: Diagnosis present

## 2020-09-30 DIAGNOSIS — E782 Mixed hyperlipidemia: Secondary | ICD-10-CM | POA: Diagnosis present

## 2020-09-30 DIAGNOSIS — Z86718 Personal history of other venous thrombosis and embolism: Secondary | ICD-10-CM

## 2020-09-30 DIAGNOSIS — E440 Moderate protein-calorie malnutrition: Secondary | ICD-10-CM | POA: Diagnosis not present

## 2020-09-30 DIAGNOSIS — I10 Essential (primary) hypertension: Secondary | ICD-10-CM | POA: Diagnosis present

## 2020-09-30 DIAGNOSIS — E11649 Type 2 diabetes mellitus with hypoglycemia without coma: Secondary | ICD-10-CM | POA: Diagnosis not present

## 2020-09-30 DIAGNOSIS — F419 Anxiety disorder, unspecified: Secondary | ICD-10-CM | POA: Diagnosis not present

## 2020-09-30 DIAGNOSIS — R000 Tachycardia, unspecified: Secondary | ICD-10-CM | POA: Diagnosis not present

## 2020-09-30 DIAGNOSIS — Z7901 Long term (current) use of anticoagulants: Secondary | ICD-10-CM | POA: Diagnosis not present

## 2020-09-30 DIAGNOSIS — Z87891 Personal history of nicotine dependence: Secondary | ICD-10-CM

## 2020-09-30 DIAGNOSIS — G936 Cerebral edema: Secondary | ICD-10-CM | POA: Diagnosis present

## 2020-09-30 DIAGNOSIS — Z20822 Contact with and (suspected) exposure to covid-19: Secondary | ICD-10-CM | POA: Diagnosis present

## 2020-09-30 DIAGNOSIS — E1165 Type 2 diabetes mellitus with hyperglycemia: Secondary | ICD-10-CM | POA: Diagnosis present

## 2020-09-30 DIAGNOSIS — K219 Gastro-esophageal reflux disease without esophagitis: Secondary | ICD-10-CM | POA: Diagnosis present

## 2020-09-30 DIAGNOSIS — Z803 Family history of malignant neoplasm of breast: Secondary | ICD-10-CM

## 2020-09-30 DIAGNOSIS — R001 Bradycardia, unspecified: Secondary | ICD-10-CM | POA: Diagnosis not present

## 2020-09-30 DIAGNOSIS — E876 Hypokalemia: Secondary | ICD-10-CM | POA: Diagnosis present

## 2020-09-30 DIAGNOSIS — D329 Benign neoplasm of meninges, unspecified: Secondary | ICD-10-CM | POA: Diagnosis not present

## 2020-09-30 DIAGNOSIS — I959 Hypotension, unspecified: Secondary | ICD-10-CM | POA: Diagnosis not present

## 2020-09-30 DIAGNOSIS — F411 Generalized anxiety disorder: Secondary | ICD-10-CM | POA: Diagnosis present

## 2020-09-30 DIAGNOSIS — I480 Paroxysmal atrial fibrillation: Secondary | ICD-10-CM | POA: Diagnosis present

## 2020-09-30 DIAGNOSIS — F0391 Unspecified dementia with behavioral disturbance: Principal | ICD-10-CM | POA: Diagnosis present

## 2020-09-30 DIAGNOSIS — E119 Type 2 diabetes mellitus without complications: Secondary | ICD-10-CM | POA: Diagnosis not present

## 2020-09-30 DIAGNOSIS — R739 Hyperglycemia, unspecified: Secondary | ICD-10-CM | POA: Diagnosis not present

## 2020-09-30 DIAGNOSIS — Z8249 Family history of ischemic heart disease and other diseases of the circulatory system: Secondary | ICD-10-CM | POA: Diagnosis not present

## 2020-09-30 DIAGNOSIS — F32A Depression, unspecified: Secondary | ICD-10-CM | POA: Diagnosis present

## 2020-09-30 DIAGNOSIS — R410 Disorientation, unspecified: Secondary | ICD-10-CM | POA: Diagnosis not present

## 2020-09-30 DIAGNOSIS — R4020 Unspecified coma: Secondary | ICD-10-CM | POA: Diagnosis not present

## 2020-09-30 DIAGNOSIS — K5900 Constipation, unspecified: Secondary | ICD-10-CM | POA: Diagnosis not present

## 2020-09-30 DIAGNOSIS — I482 Chronic atrial fibrillation, unspecified: Secondary | ICD-10-CM | POA: Diagnosis not present

## 2020-09-30 DIAGNOSIS — R0602 Shortness of breath: Secondary | ICD-10-CM | POA: Diagnosis not present

## 2020-09-30 DIAGNOSIS — Z9114 Patient's other noncompliance with medication regimen: Secondary | ICD-10-CM

## 2020-09-30 DIAGNOSIS — Z794 Long term (current) use of insulin: Secondary | ICD-10-CM

## 2020-09-30 DIAGNOSIS — Z833 Family history of diabetes mellitus: Secondary | ICD-10-CM

## 2020-09-30 DIAGNOSIS — F03918 Unspecified dementia, unspecified severity, with other behavioral disturbance: Secondary | ICD-10-CM | POA: Diagnosis present

## 2020-09-30 DIAGNOSIS — R569 Unspecified convulsions: Secondary | ICD-10-CM | POA: Diagnosis present

## 2020-09-30 DIAGNOSIS — R4182 Altered mental status, unspecified: Secondary | ICD-10-CM | POA: Diagnosis present

## 2020-09-30 DIAGNOSIS — I2510 Atherosclerotic heart disease of native coronary artery without angina pectoris: Secondary | ICD-10-CM | POA: Diagnosis present

## 2020-09-30 DIAGNOSIS — Z79899 Other long term (current) drug therapy: Secondary | ICD-10-CM | POA: Diagnosis not present

## 2020-09-30 DIAGNOSIS — G9389 Other specified disorders of brain: Secondary | ICD-10-CM | POA: Diagnosis not present

## 2020-09-30 DIAGNOSIS — Z881 Allergy status to other antibiotic agents status: Secondary | ICD-10-CM | POA: Diagnosis not present

## 2020-09-30 DIAGNOSIS — Z888 Allergy status to other drugs, medicaments and biological substances status: Secondary | ICD-10-CM

## 2020-09-30 DIAGNOSIS — G4700 Insomnia, unspecified: Secondary | ICD-10-CM | POA: Diagnosis present

## 2020-09-30 DIAGNOSIS — D320 Benign neoplasm of cerebral meninges: Secondary | ICD-10-CM | POA: Diagnosis present

## 2020-09-30 DIAGNOSIS — R404 Transient alteration of awareness: Secondary | ICD-10-CM | POA: Diagnosis not present

## 2020-09-30 DIAGNOSIS — Z7401 Bed confinement status: Secondary | ICD-10-CM | POA: Diagnosis not present

## 2020-09-30 DIAGNOSIS — R5383 Other fatigue: Secondary | ICD-10-CM | POA: Diagnosis not present

## 2020-09-30 LAB — URINALYSIS, COMPLETE (UACMP) WITH MICROSCOPIC
Bilirubin Urine: NEGATIVE
Glucose, UA: >=500 mg/dL — AB
Hgb urine dipstick: NEGATIVE
Ketones, ur: 80 mg/dL — AB
Leukocytes,Ua: NEGATIVE
Nitrite: NEGATIVE
Protein, ur: NEGATIVE mg/dL
Specific Gravity, Urine: 1.027 (ref 1.005–1.030)
pH: 5 (ref 5.0–8.0)

## 2020-09-30 LAB — COMPREHENSIVE METABOLIC PANEL
ALT: 28 U/L (ref 0–44)
AST: 34 U/L (ref 15–41)
Albumin: 4 g/dL (ref 3.5–5.0)
Alkaline Phosphatase: 100 U/L (ref 38–126)
Anion gap: 14 (ref 5–15)
BUN: 13 mg/dL (ref 8–23)
CO2: 29 mmol/L (ref 22–32)
Calcium: 10 mg/dL (ref 8.9–10.3)
Chloride: 95 mmol/L — ABNORMAL LOW (ref 98–111)
Creatinine, Ser: 0.63 mg/dL (ref 0.44–1.00)
GFR, Estimated: >60 mL/min (ref >60)
Glucose, Bld: 343 mg/dL — ABNORMAL HIGH (ref 70–99)
Potassium: 3.2 mmol/L — ABNORMAL LOW (ref 3.5–5.1)
Sodium: 138 mmol/L (ref 135–145)
Total Bilirubin: 1.9 mg/dL — ABNORMAL HIGH (ref 0.3–1.2)
Total Protein: 7.1 g/dL (ref 6.5–8.1)

## 2020-09-30 LAB — ETHANOL: Alcohol, Ethyl (B): <10 mg/dL (ref <10)

## 2020-09-30 LAB — CBC WITH DIFFERENTIAL/PLATELET
Abs Immature Granulocytes: 0.02 10*3/uL (ref 0.00–0.07)
Basophils Absolute: 0 10*3/uL (ref 0.0–0.1)
Basophils Relative: 0 %
Eosinophils Absolute: 0 10*3/uL (ref 0.0–0.5)
Eosinophils Relative: 1 %
HCT: 44 % (ref 36.0–46.0)
Hemoglobin: 14.8 g/dL (ref 12.0–15.0)
Immature Granulocytes: 0 %
Lymphocytes Relative: 18 %
Lymphs Abs: 1 10*3/uL (ref 0.7–4.0)
MCH: 28.8 pg (ref 26.0–34.0)
MCHC: 33.6 g/dL (ref 30.0–36.0)
MCV: 85.6 fL (ref 80.0–100.0)
Monocytes Absolute: 0.4 10*3/uL (ref 0.1–1.0)
Monocytes Relative: 7 %
Neutro Abs: 4.5 10*3/uL (ref 1.7–7.7)
Neutrophils Relative %: 74 %
Platelets: 216 10*3/uL (ref 150–400)
RBC: 5.14 MIL/uL — ABNORMAL HIGH (ref 3.87–5.11)
RDW: 13.6 % (ref 11.5–15.5)
WBC: 6 10*3/uL (ref 4.0–10.5)
nRBC: 0 % (ref 0.0–0.2)

## 2020-09-30 LAB — MAGNESIUM: Magnesium: 1.9 mg/dL (ref 1.7–2.4)

## 2020-09-30 LAB — RESP PANEL BY RT-PCR (FLU A&B, COVID) ARPGX2
Influenza A by PCR: NEGATIVE
Influenza B by PCR: NEGATIVE
SARS Coronavirus 2 by RT PCR: NEGATIVE

## 2020-09-30 LAB — QUANTIFERON-TB GOLD PLUS
QuantiFERON Mitogen Value: >10 IU/mL
QuantiFERON Nil Value: 0.29 IU/mL
QuantiFERON TB1 Ag Value: 0.05 IU/mL
QuantiFERON TB2 Ag Value: 0.05 IU/mL
QuantiFERON-TB Gold Plus: NEGATIVE

## 2020-09-30 LAB — ACETAMINOPHEN LEVEL: Acetaminophen (Tylenol), Serum: <10 ug/mL — ABNORMAL LOW (ref 10–30)

## 2020-09-30 LAB — URINE DRUG SCREEN, QUALITATIVE (ARMC ONLY)
Amphetamines, Ur Screen: NOT DETECTED
Barbiturates, Ur Screen: NOT DETECTED
Benzodiazepine, Ur Scrn: NOT DETECTED
Cannabinoid 50 Ng, Ur ~~LOC~~: NOT DETECTED
Cocaine Metabolite,Ur ~~LOC~~: NOT DETECTED
MDMA (Ecstasy)Ur Screen: NOT DETECTED
Methadone Scn, Ur: NOT DETECTED
Opiate, Ur Screen: NOT DETECTED
Phencyclidine (PCP) Ur S: NOT DETECTED
Tricyclic, Ur Screen: NOT DETECTED

## 2020-09-30 LAB — TROPONIN I (HIGH SENSITIVITY): Troponin I (High Sensitivity): 10 ng/L (ref <18)

## 2020-09-30 LAB — CBG MONITORING, ED
Glucose-Capillary: 327 mg/dL — ABNORMAL HIGH (ref 70–99)
Glucose-Capillary: 274 mg/dL — ABNORMAL HIGH (ref 70–99)
Glucose-Capillary: 169 mg/dL — ABNORMAL HIGH (ref 70–99)

## 2020-09-30 LAB — SALICYLATE LEVEL: Salicylate Lvl: <7 mg/dL — ABNORMAL LOW (ref 7.0–30.0)

## 2020-09-30 IMAGING — CT CT HEAD W/O CM
3 series · 16 of 47 positions shown, 19 images · non-contrast
Comparison: [DATE] and MRI [DATE]

CLINICAL DATA: 88-year-old with mental status change.

EXAM:
CT HEAD WITHOUT CONTRAST
TECHNIQUE: Contiguous axial images were obtained from the base of the skull
through the vertex without intravenous contrast.

[Series 2: head wo · axial · 0.44mm/px · z∈[-128,+12]mm · 10 of 34 slices shown, 13 images]
[im 3/34  brain]
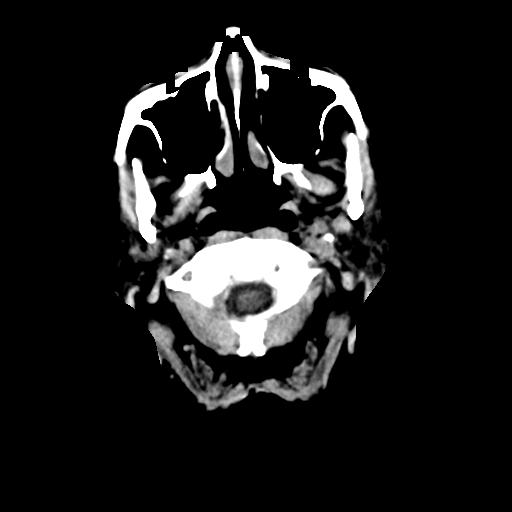
[im 3/34  bone]
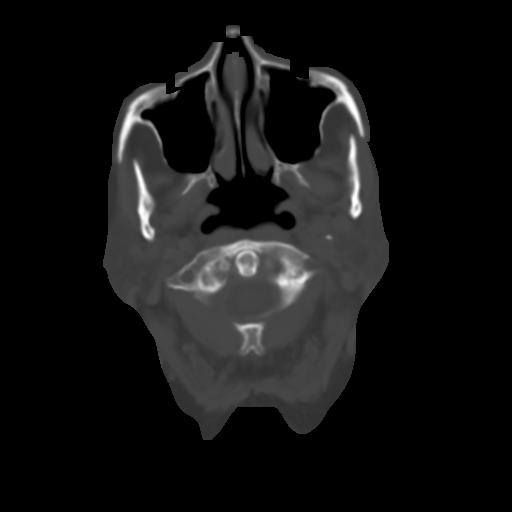
[im 6/34  brain]
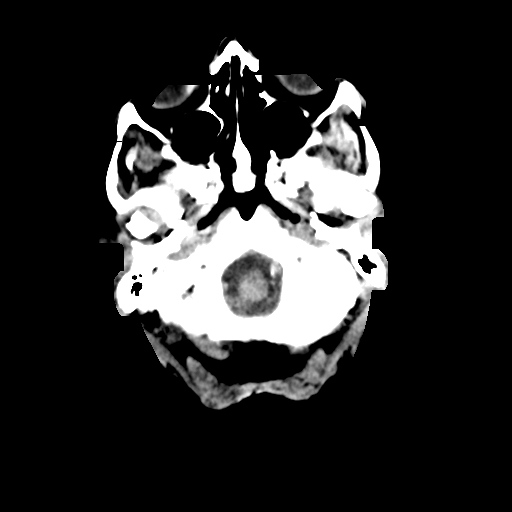
[im 10/34  brain]
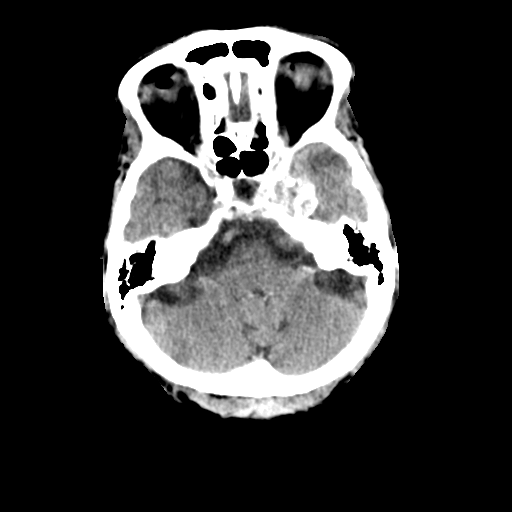
[im 12/34  brain]
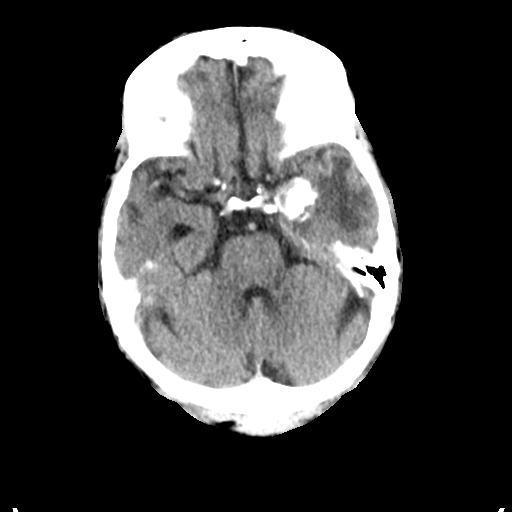
[im 15/34  brain]
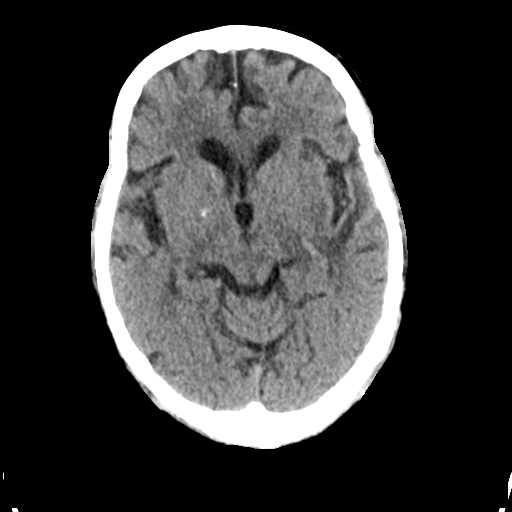
[im 15/34  bone]
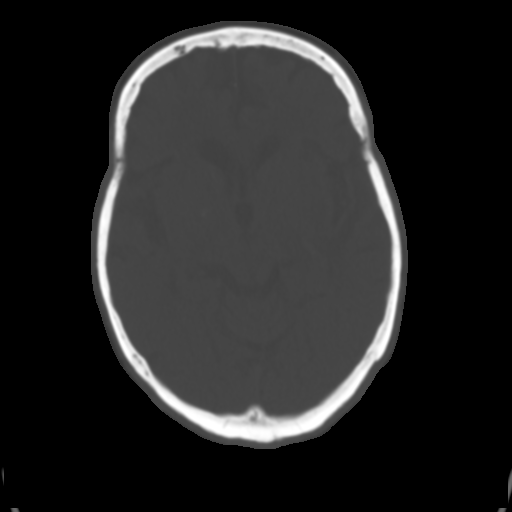
[im 19/34  brain]
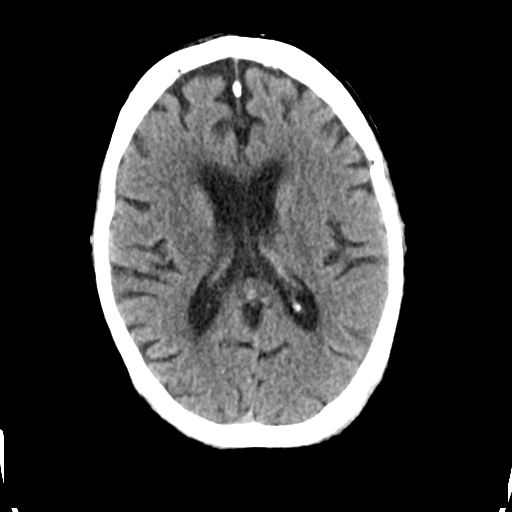
[im 22/34  brain]
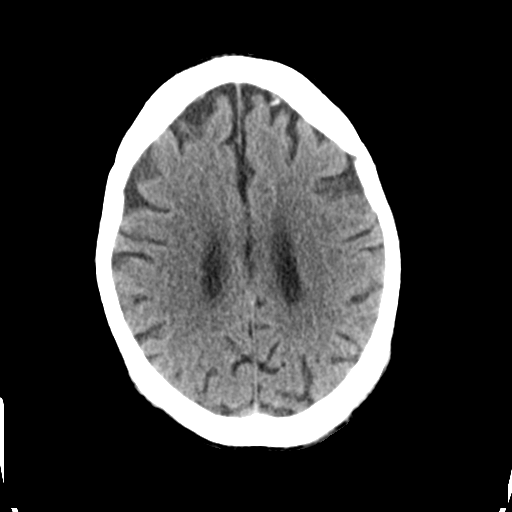
[im 26/34  brain]
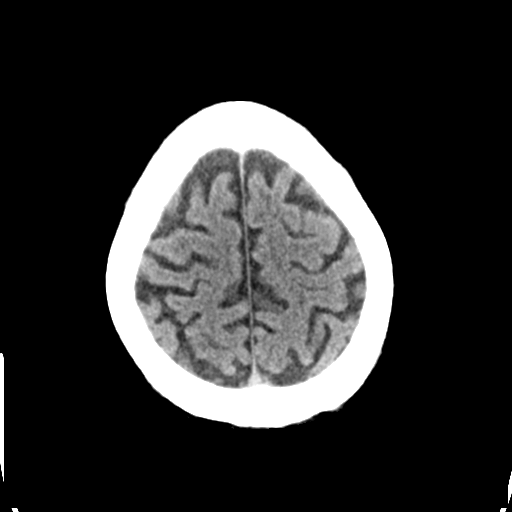
[im 28/34  brain]
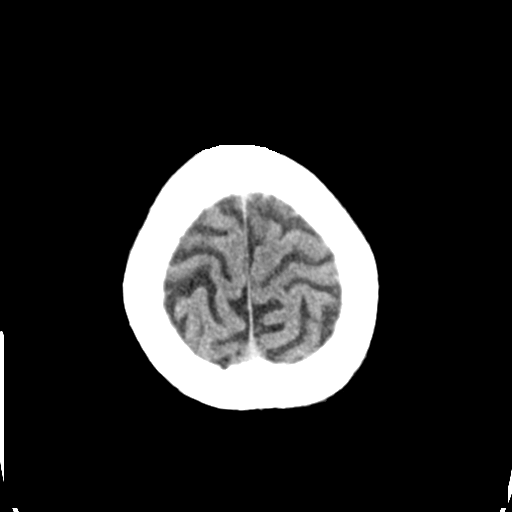
[im 28/34  bone]
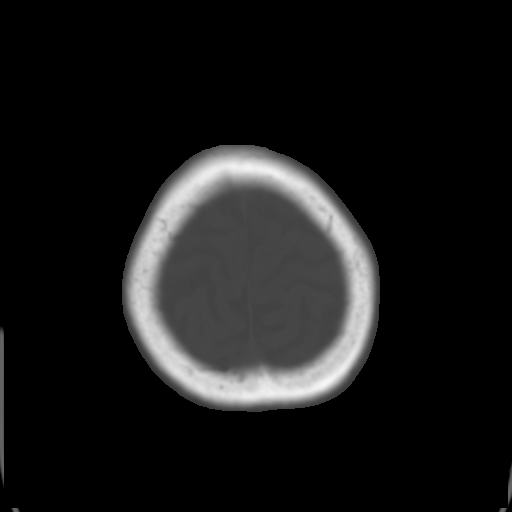
[im 31/34  brain]
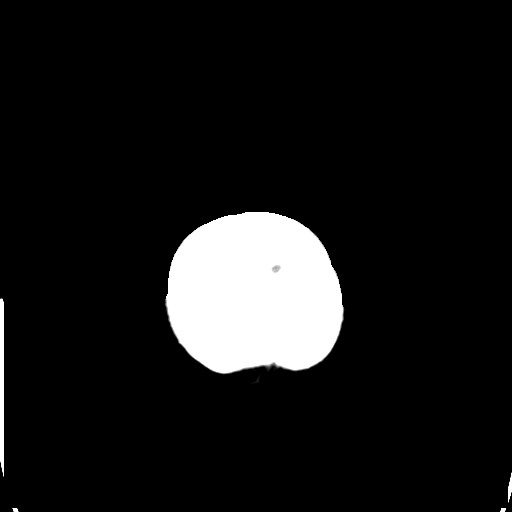

[Series 4: coronal soft tissue · coronal · 0.32mm/px · 3 of 65 slices shown]
[im 22/65  brain]
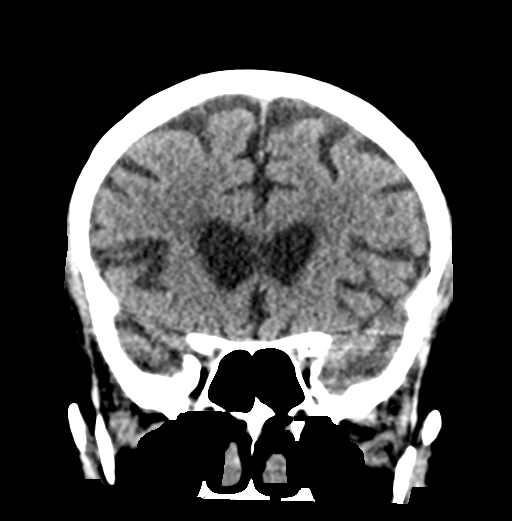
[im 29/65  brain]
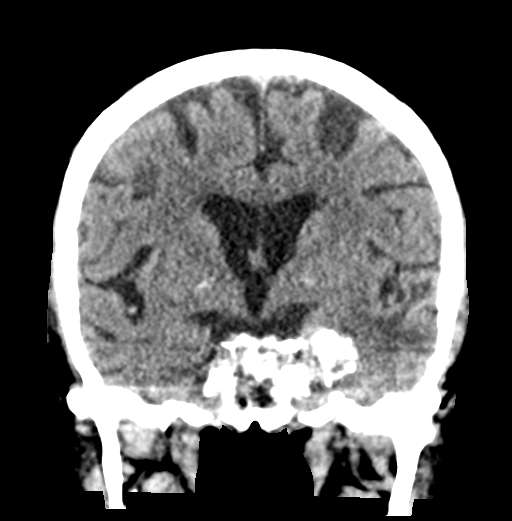
[im 36/65  brain]
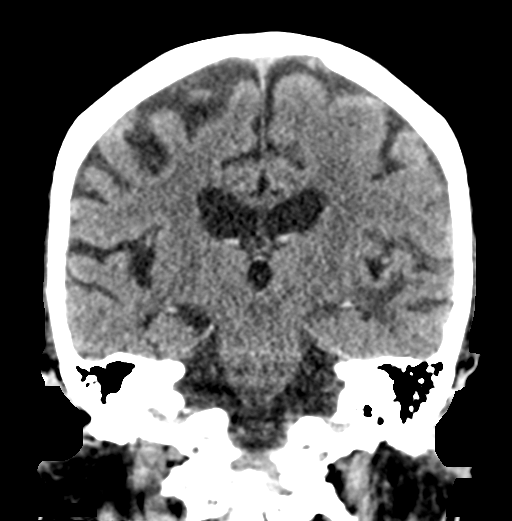

[Series 5: sagittal soft tissue · sagittal · 0.32mm/px · 3 of 54 slices shown]
[im 18/54  brain]
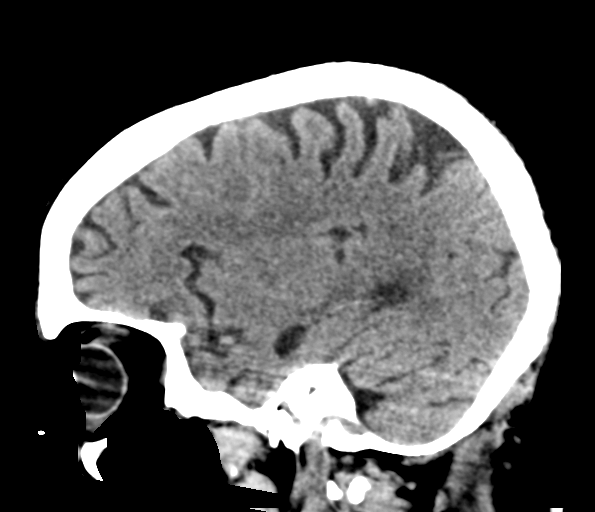
[im 27/54  brain]
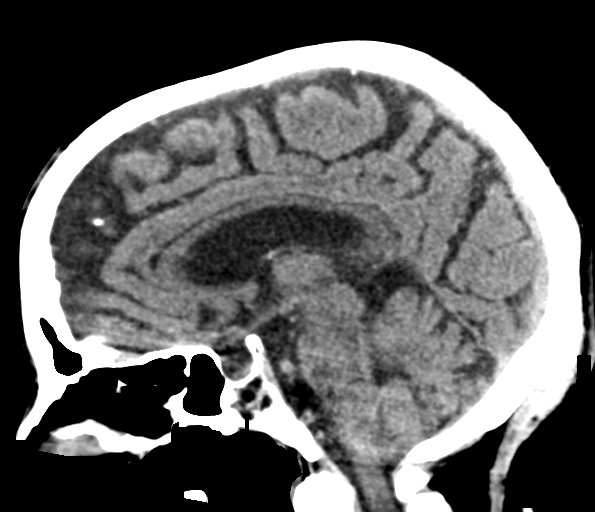
[im 36/54  brain]
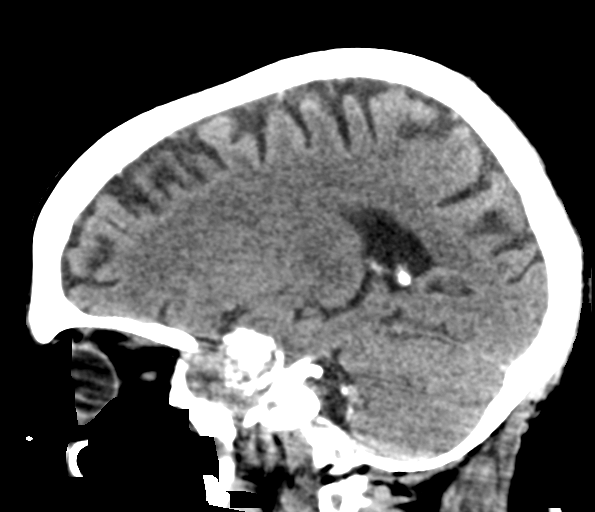

[16 of 47 positions shown; findings below may reference images not displayed]

FINDINGS: Brain: Again noted is a large calcified lesion in the left middle
cranial fossa measures up to 2.1 cm. Based on previous imaging, this
is compatible with a left paraclinoid meningioma. Again noted is
vasogenic edema in the left temporal lobe that is similar to the
prior examination. Stable mild cerebral atrophy. Mild low-density in
the white matter is stable. No evidence for acute hemorrhage,
midline shift, hemorrhage or large new infarct.

Vascular: No hyperdense vessel or unexpected calcification.

Skull: Stable appearance of the meningioma in the left middle
cranial fossa with apparent involvement of the cavernous sinus. No
acute bone abnormality.

Sinuses/Orbits: Visualized paranasal sinuses are clear.

Other: None
IMPRESSION: 1. Stable head CT.
2. No significant change in the left paraclinoid meningioma. Stable
vasogenic edema involving the left temporal lobe.

## 2020-09-30 MED ORDER — RAMIPRIL 5 MG PO CAPS
5.0000 mg | ORAL_CAPSULE | Freq: Every day | ORAL | Status: DC
  Administered 2020-10-01 – 2020-11-20 (×50): 5 mg via ORAL
  Filled 2020-09-30 (×52): qty 1

## 2020-09-30 MED ORDER — HYDRALAZINE HCL 20 MG/ML IJ SOLN
5.0000 mg | INTRAMUSCULAR | Status: DC | PRN
  Administered 2020-10-04: 08:00:00 5 mg via INTRAVENOUS
  Filled 2020-09-30: qty 1

## 2020-09-30 MED ORDER — LORAZEPAM 2 MG/ML IJ SOLN: 1.0000 mg | INTRAMUSCULAR | Status: DC | PRN

## 2020-09-30 MED ORDER — LEVETIRACETAM IN NACL 1000 MG/100ML IV SOLN
1000.0000 mg | Freq: Once | INTRAVENOUS | Status: AC
  Administered 2020-09-30: 1000 mg via INTRAVENOUS
  Filled 2020-09-30: qty 100

## 2020-09-30 MED ORDER — SODIUM CHLORIDE 0.9 % IV BOLUS
1000.0000 mL | Freq: Once | INTRAVENOUS | Status: AC
  Administered 2020-09-30: 1000 mL via INTRAVENOUS

## 2020-09-30 MED ORDER — INSULIN ASPART 100 UNIT/ML IJ SOLN
0.0000 [IU] | Freq: Three times a day (TID) | INTRAMUSCULAR | Status: DC
  Administered 2020-09-30: 8 [IU] via SUBCUTANEOUS
  Administered 2020-10-01: 18:00:00 5 [IU] via SUBCUTANEOUS
  Administered 2020-10-01 – 2020-10-03 (×6): 3 [IU] via SUBCUTANEOUS
  Administered 2020-10-03 – 2020-10-04 (×2): 2 [IU] via SUBCUTANEOUS
  Administered 2020-10-04: 5 [IU] via SUBCUTANEOUS
  Administered 2020-10-04: 3 [IU] via SUBCUTANEOUS
  Administered 2020-10-05 – 2020-10-06 (×2): 5 [IU] via SUBCUTANEOUS
  Administered 2020-10-06: 3 [IU] via SUBCUTANEOUS
  Administered 2020-10-06: 5 [IU] via SUBCUTANEOUS
  Administered 2020-10-07: 12:00:00 8 [IU] via SUBCUTANEOUS
  Administered 2020-10-07 – 2020-10-08 (×3): 5 [IU] via SUBCUTANEOUS
  Administered 2020-10-08 – 2020-10-09 (×3): 8 [IU] via SUBCUTANEOUS
  Administered 2020-10-09: 3 [IU] via SUBCUTANEOUS
  Administered 2020-10-09: 18:00:00 8 [IU] via SUBCUTANEOUS
  Administered 2020-10-10: 16:00:00 2 [IU] via SUBCUTANEOUS
  Administered 2020-10-10: 5 [IU] via SUBCUTANEOUS
  Administered 2020-10-10: 08:00:00 2 [IU] via SUBCUTANEOUS
  Administered 2020-10-11: 18:00:00 3 [IU] via SUBCUTANEOUS
  Administered 2020-10-11: 2 [IU] via SUBCUTANEOUS
  Administered 2020-10-12: 3 [IU] via SUBCUTANEOUS
  Administered 2020-10-13: 18:00:00 5 [IU] via SUBCUTANEOUS
  Administered 2020-10-13: 3 [IU] via SUBCUTANEOUS
  Administered 2020-10-14: 5 [IU] via SUBCUTANEOUS
  Administered 2020-10-14 – 2020-10-16 (×4): 3 [IU] via SUBCUTANEOUS
  Administered 2020-10-17 (×2): 5 [IU] via SUBCUTANEOUS
  Administered 2020-10-18: 2 [IU] via SUBCUTANEOUS
  Administered 2020-10-18: 3 [IU] via SUBCUTANEOUS
  Administered 2020-10-19: 2 [IU] via SUBCUTANEOUS
  Administered 2020-10-19: 3 [IU] via SUBCUTANEOUS
  Administered 2020-10-20: 5 [IU] via SUBCUTANEOUS
  Administered 2020-10-21: 12:00:00 2 [IU] via SUBCUTANEOUS
  Administered 2020-10-21: 18:00:00 5 [IU] via SUBCUTANEOUS
  Administered 2020-10-22 (×3): 3 [IU] via SUBCUTANEOUS
  Administered 2020-10-23: 17:00:00 5 [IU] via SUBCUTANEOUS
  Administered 2020-10-23: 3 [IU] via SUBCUTANEOUS
  Administered 2020-10-23 – 2020-10-24 (×2): 5 [IU] via SUBCUTANEOUS
  Administered 2020-10-24: 8 [IU] via SUBCUTANEOUS
  Administered 2020-10-24: 5 [IU] via SUBCUTANEOUS
  Administered 2020-10-25: 14:00:00 3 [IU] via SUBCUTANEOUS
  Administered 2020-10-25: 18:00:00 8 [IU] via SUBCUTANEOUS
  Administered 2020-10-25: 2 [IU] via SUBCUTANEOUS
  Administered 2020-10-26 (×2): 3 [IU] via SUBCUTANEOUS
  Administered 2020-10-26: 09:00:00 8 [IU] via SUBCUTANEOUS
  Administered 2020-10-27: 10:00:00 3 [IU] via SUBCUTANEOUS
  Administered 2020-10-27: 18:00:00 5 [IU] via SUBCUTANEOUS
  Administered 2020-10-27: 2 [IU] via SUBCUTANEOUS
  Administered 2020-10-28 (×3): 5 [IU] via SUBCUTANEOUS
  Administered 2020-10-29: 3 [IU] via SUBCUTANEOUS
  Administered 2020-10-29 (×2): 5 [IU] via SUBCUTANEOUS
  Administered 2020-10-30: 09:00:00 2 [IU] via SUBCUTANEOUS
  Administered 2020-10-30 – 2020-10-31 (×2): 3 [IU] via SUBCUTANEOUS
  Administered 2020-10-31: 12:00:00 2 [IU] via SUBCUTANEOUS
  Administered 2020-10-31: 09:00:00 5 [IU] via SUBCUTANEOUS
  Administered 2020-11-01 – 2020-11-02 (×3): 3 [IU] via SUBCUTANEOUS
  Administered 2020-11-03: 2 [IU] via SUBCUTANEOUS
  Administered 2020-11-05: 3 [IU] via SUBCUTANEOUS
  Administered 2020-11-05: 09:00:00 2 [IU] via SUBCUTANEOUS
  Administered 2020-11-06: 10:00:00 3 [IU] via SUBCUTANEOUS
  Administered 2020-11-06: 2 [IU] via SUBCUTANEOUS
  Filled 2020-09-30 (×83): qty 1

## 2020-09-30 MED ORDER — LORAZEPAM 0.5 MG PO TABS: 0.5000 mg | ORAL_TABLET | Freq: Every evening | ORAL | Status: DC | PRN

## 2020-09-30 MED ORDER — ACETAMINOPHEN 650 MG RE SUPP: 650.0000 mg | Freq: Four times a day (QID) | RECTAL | Status: DC | PRN

## 2020-09-30 MED ORDER — INSULIN ASPART 100 UNIT/ML IJ SOLN
0.0000 [IU] | Freq: Every day | INTRAMUSCULAR | Status: DC
  Administered 2020-10-01 – 2020-10-05 (×2): 2 [IU] via SUBCUTANEOUS
  Administered 2020-10-06: 3 [IU] via SUBCUTANEOUS
  Administered 2020-10-07: 2 [IU] via SUBCUTANEOUS
  Administered 2020-10-08: 20:00:00 3 [IU] via SUBCUTANEOUS
  Administered 2020-10-16 – 2020-10-22 (×4): 2 [IU] via SUBCUTANEOUS
  Administered 2020-10-24 – 2020-10-25 (×2): 3 [IU] via SUBCUTANEOUS
  Administered 2020-10-26 – 2020-10-27 (×2): 2 [IU] via SUBCUTANEOUS
  Administered 2020-10-28: 5 [IU] via SUBCUTANEOUS
  Administered 2020-10-29 – 2020-11-05 (×2): 2 [IU] via SUBCUTANEOUS
  Administered 2020-11-10: 3 [IU] via SUBCUTANEOUS
  Administered 2020-11-15 – 2020-11-17 (×3): 2 [IU] via SUBCUTANEOUS
  Filled 2020-09-30 (×19): qty 1

## 2020-09-30 MED ORDER — ACETAMINOPHEN 325 MG PO TABS
650.0000 mg | ORAL_TABLET | Freq: Four times a day (QID) | ORAL | Status: DC | PRN
  Administered 2020-10-12 – 2020-11-17 (×6): 650 mg via ORAL
  Filled 2020-09-30 (×6): qty 2

## 2020-09-30 MED ORDER — ONDANSETRON HCL 4 MG/2ML IJ SOLN: 4.0000 mg | Freq: Three times a day (TID) | INTRAMUSCULAR | Status: DC | PRN

## 2020-09-30 MED ORDER — POTASSIUM CHLORIDE CRYS ER 20 MEQ PO TBCR
40.0000 meq | EXTENDED_RELEASE_TABLET | Freq: Once | ORAL | Status: AC
  Administered 2020-09-30: 40 meq via ORAL
  Filled 2020-09-30: qty 2

## 2020-09-30 MED ORDER — APIXABAN 5 MG PO TABS
5.0000 mg | ORAL_TABLET | Freq: Two times a day (BID) | ORAL | Status: DC
  Administered 2020-09-30 – 2020-11-20 (×102): 5 mg via ORAL
  Filled 2020-09-30 (×6): qty 1
  Filled 2020-09-30: qty 2
  Filled 2020-09-30 (×19): qty 1
  Filled 2020-09-30: qty 2
  Filled 2020-09-30 (×4): qty 1
  Filled 2020-09-30: qty 2
  Filled 2020-09-30 (×31): qty 1
  Filled 2020-09-30: qty 2
  Filled 2020-09-30 (×38): qty 1

## 2020-09-30 NOTE — ED Notes (Signed)
Orthostatics  laying: HR 62 , BP 152/59 sitting: HR 70, BP 172/65, Standing: HR 78, BP 155/66

## 2020-09-30 NOTE — ED Notes (Signed)
ED stretcher swapped out for Hospital bed, warm blankets provided, pt. To bed from chair. Cooperative and conversational.

## 2020-09-30 NOTE — ED Notes (Signed)
PT transported to CT>

## 2020-09-30 NOTE — ED Notes (Signed)
Pt oob x 3 in hall requesting that she go home with her children. Pt becoming upset when not allowed to leave but has not become physically aggressive.

## 2020-09-30 NOTE — ED Triage Notes (Signed)
Pt in via EMS from home with c/o unresponsive. Family found her this am very agitated and then went unresponsive in her chair. EMS reports upon their arrival, pt not responsive to verbal stimuli and pupils dilated. Pt opened eyes en route and can answer some questions with one word answers. MD saw pt upon arrival to ED  FSBS 360, 145/65, Temp 98.1, 92% RA, 92-107HR

## 2020-09-30 NOTE — ED Provider Notes (Addendum)
[facility] Emergency Department Provider Note  ____________________________________________   Event Date/Time   First MD Initiated Contact with Patient 09/30/20 1404     (approximate)  I have reviewed the triage vital signs and the nursing notes.   HISTORY  Chief Complaint Altered Mental Status    HPI Daisy Watkins is a 87 y.o. female  with DVT on eliquis who comes in for AMS.  Patient had an episode of unresponsiveness witnessed by family.  Patient was unresponsive when EMS got there but then started waking up and was able to follow commands but was nonverbal but now is verbal upon my assessment.  Patient denies knowing what happened today.  There is also concern from family that she has not been taking her medication and not taking care of herself therefore IVC was placed.  Unable to get full HPI due to patient's episode of altered mental status per  Past Medical History:  Diagnosis Date   Anxiety    Constipation 08/17/2015   Diabetes mellitus without complication ([place])    DVT (deep venous thrombosis) ([place]) 2006   chronic anticoagulation   Encounter for monitoring Coumadin therapy 12/16/2016   GERD (gastroesophageal reflux disease)    Hyperlipidemia    Hypertension    Insomnia    Monitoring for anticoagulant use 09/21/2014   Goal INR 2-3; recurrent DVTs, paroxysmal atrial fibrillation.  Unable to take Xaralto   Myalgia 02/23/2017   Sebaceous cyst 06/11/2015   Skin lesion of back 09/05/2016   Trapezius muscle spasm 05/22/2017   Urinary frequency 11/12/2016   Vitamin B12 deficiency    Vitamin D deficiency disease     Patient Active Problem List   Diagnosis Date Noted   Malnutrition of moderate degree ([place]) 09/24/2020   Meningioma ([place]) 09/15/2020   Confusion 09/15/2020   Psychosis in elderly with behavioral disturbance ([place]) 09/14/2020   AMS (altered mental status) 09/14/2020   Shoulder impingement 05/01/2017   Insomnia 11/12/2016   DNR (do not  resuscitate) 09/30/2016   Advance directive discussed with patient 09/30/2016   Allergic rhinitis 11/23/2015   Major depression 09/19/2015   DVT, lower extremity, recurrent ([place]) 12/26/2014   Anxiety, generalized 11/23/2014   Aortic atherosclerosis ([place]) 09/28/2014   Coronary artery disease 09/28/2014   Fatty liver 09/28/2014   Paroxysmal A-fib ([place]) 09/28/2014   Benign essential hypertension 09/26/2014   GERD (gastroesophageal reflux disease) 09/26/2014   History of DVT (deep vein thrombosis) 09/26/2014   Type 2 diabetes mellitus with hyperglycemia ([place]) 09/21/2014   Hyperlipidemia, mixed 09/21/2014   Postzoster neuralgia 09/21/2014    Past Surgical History:  Procedure Laterality Date   ABDOMINAL HYSTERECTOMY      Prior to Admission medications   Medication Sig Start Date End Date Taking? Authorizing Provider  acetaminophen (TYLENOL) 500 MG tablet Take 500 mg every 6 (six) hours as needed by mouth. As needed    [provider]  apixaban (ELIQUIS) 5 MG TABS tablet Take 1 tablet (5 mg total) by mouth 2 (two) times daily. 09/24/20   Johnson, Megan P, DO  LORazepam (ATIVAN) 0.5 MG tablet Take 1 tablet (0.5 mg total) by mouth at bedtime as needed. 09/24/20   Johnson, Megan P, DO  ramipril (ALTACE) 5 MG capsule Take 1 capsule (5 mg total) by mouth daily. 09/24/20   Johnson, Megan P, DO  Semaglutide, 1 MG/DOSE, 4 MG/3ML SOPN Inject 0.25 mg into the skin once a week for 28 days, THEN 0.5 mg once a week for  28 days, THEN 1 mg once a week for 28 days. 09/24/20 12/17/20  Johnson, Megan P, DO  Semaglutide, 1 MG/DOSE, 4 MG/3ML SOPN Inject 1 mg into the skin once a week. 09/24/20 10/24/20  Valerie Roys, DO  TRUETRACK TEST test strip  12/29/17   [provider]    Allergies Ciprofloxacin, Clindamycin/lincomycin, Elemental sulfur, Hctz [hydrochlorothiazide], and Doxycycline hyclate  Family History  Problem Relation Age of Onset   Diabetes Maternal Grandmother    Hypertension  Mother    Cancer Daughter        breast   Heart disease Son    Heart disease Son     Social History Social History   Tobacco Use   Smoking status: Former    Packs/day: 0.50    Years: 30.00    Pack years: 15.00    Types: Cigarettes    Quit date: 03/31/1977    Years since quitting: 43.5   Smokeless tobacco: Never  Vaping Use   Vaping Use: Never used  Substance Use Topics   Alcohol use: No    Alcohol/week: 0.0 standard drinks    Comment: rare   Drug use: No      Review of Systems Unable to get full review of systems due to patient's altered mental status. ____________________________________________   PHYSICAL EXAM:  VITAL SIGNS: ED Triage Vitals  Enc Vitals Group     BP 09/30/20 1034 (!) 145/75     Pulse Rate 09/30/20 1034 94     Resp 09/30/20 1034 17     Temp 09/30/20 1034 98.2 F (36.8 C)     Temp Source 09/30/20 1034 Oral     SpO2 09/30/20 1034 96 %     Weight 09/30/20 1031 164 lb 3.2 oz (74.5 kg)     Height 09/30/20 1031 5\' 6"  (1.676 m)     Head Circumference --      Peak Flow --      Pain Score 09/30/20 1031 0     Pain Loc --      Pain Edu? --      Excl. in [place]? --     Constitutional: Alert and oriented. Well appearing and in no acute distress. Eyes: Conjunctivae are normal. EOMI. Head: Atraumatic. Nose: No congestion/rhinnorhea. Mouth/Throat: Mucous membranes are moist.   Neck: No stridor. Trachea Midline. FROM Cardiovascular: Normal rate, regular rhythm. Grossly normal heart sounds.  Good peripheral circulation. Respiratory: Normal respiratory effort.  No retractions. Lungs CTAB. Gastrointestinal: Soft and nontender. No distention. No abdominal bruits.  Musculoskeletal: No lower extremity tenderness nor edema.  No joint effusions. Neurologic:  Normal speech and language. No gross focal neurologic deficits are appreciated.  Skin:  Skin is warm, dry and intact. No rash noted. Psychiatric: Mood and affect are normal. Speech and behavior are  normal. GU: Deferred   ____________________________________________   LABS (all labs ordered are listed, but only abnormal results are displayed)  Labs Reviewed  CBC WITH DIFFERENTIAL/PLATELET - Abnormal; Notable for the following components:      Result Value   RBC 5.14 (*)    All other components within normal limits  COMPREHENSIVE METABOLIC PANEL - Abnormal; Notable for the following components:   Potassium 3.2 (*)    Chloride 95 (*)    Glucose, Bld 343 (*)    Total Bilirubin 1.9 (*)    All other components within normal limits  SALICYLATE LEVEL - Abnormal; Notable for the following components:   Salicylate Lvl <9.7 (*)  All other components within normal limits  ACETAMINOPHEN LEVEL - Abnormal; Notable for the following components:   Acetaminophen (Tylenol), Serum <10 (*)    All other components within normal limits  CBG MONITORING, ED - Abnormal; Notable for the following components:   Glucose-Capillary 327 (*)    All other components within normal limits  RESP PANEL BY RT-PCR (FLU A&B, COVID) ARPGX2  ETHANOL  URINE DRUG SCREEN, QUALITATIVE ([facility] ONLY)  URINALYSIS, COMPLETE (UACMP) WITH MICROSCOPIC  TROPONIN I (HIGH SENSITIVITY)  TROPONIN I (HIGH SENSITIVITY)   ____________________________________________   ED ECG REPORT I, Vanessa [place], the attending physician, personally viewed and interpreted this ECG.  Normal sinus rate of 83, no ST elevation, no T wave inversions, normal intervals, sinus arrhythmia ____________________________________________  RADIOLOGY Robert Bellow, personally viewed and evaluated these images (plain radiographs) as part of my medical decision making, as well as reviewing the written report by the radiologist.  ED MD interpretation: No pneumonia  Official radiology report(s): DG Chest 2 View  Result Date: 09/30/2020 CLINICAL DATA:  Shortness of breath EXAM: CHEST - 2 VIEW COMPARISON:  05/31/2005 chest radiograph FINDINGS: The  cardiomediastinal silhouette is unremarkable. Mild peribronchial thickening is again noted. There is no evidence of focal airspace disease, pulmonary edema, suspicious pulmonary nodule/mass, pleural effusion, or pneumothorax. No acute bony abnormalities are identified. IMPRESSION: No evidence of acute cardiopulmonary disease. Electronically Signed   By: Margarette Canada M.D.   On: 09/30/2020 11:27   CT Head Wo Contrast  Result Date: 09/30/2020 CLINICAL DATA:  87 year old with mental status change. EXAM: CT HEAD WITHOUT CONTRAST TECHNIQUE: Contiguous axial images were obtained from the base of the skull through the vertex without intravenous contrast. COMPARISON:  09/14/2020 and MRI 09/14/2020 FINDINGS: Brain: Again noted is a large calcified lesion in the left middle cranial fossa measures up to 2.1 cm. Based on previous imaging, this is compatible with a left paraclinoid meningioma. Again noted is vasogenic edema in the left temporal lobe that is similar to the prior examination. Stable mild cerebral atrophy. Mild low-density in the white matter is stable. No evidence for acute hemorrhage, midline shift, hemorrhage or large new infarct. Vascular: No hyperdense vessel or unexpected calcification. Skull: Stable appearance of the meningioma in the left middle cranial fossa with apparent involvement of the cavernous sinus. No acute bone abnormality. Sinuses/Orbits: Visualized paranasal sinuses are clear. Other: None IMPRESSION: 1. Stable head CT. 2. No significant change in the left paraclinoid meningioma. Stable vasogenic edema involving the left temporal lobe. Electronically Signed   By: Markus Daft M.D.   On: 09/30/2020 11:15    ____________________________________________   PROCEDURES  Procedure(s) performed (including Critical Care):  .1-3 Lead EKG Interpretation  Date/Time: 09/30/2020 3:13 PM Performed by: Vanessa Ashley, MD Authorized by: Vanessa Rio [place], MD     Interpretation: normal     ECG rate:   60s   ECG rate assessment: normal     Rhythm: sinus rhythm     Ectopy: none     Conduction: normal     ____________________________________________   INITIAL IMPRESSION / ASSESSMENT AND PLAN / ED COURSE  Daisy Watkins was evaluated in Emergency Department on 09/30/2020 for the symptoms described in the history of present illness. She was evaluated in the context of the global COVID-19 pandemic, which necessitated consideration that the patient might be at risk for infection with the SARS-CoV-2 virus that causes COVID-19. Institutional protocols and algorithms that pertain to the evaluation of patients at  risk for COVID-19 are in a state of rapid change based on information released by regulatory bodies including the CDC and federal and state organizations. These policies and algorithms were followed during the patient's care in the ED.    Patient is an 87 year old who comes in with episode of unresponsiveness.  Consider the possibility of arrhythmia, ACS.  Not short of breath not hypoxic unlikely PE.  No abdominal pain to suggest AAA rupture.  Also consider the possibility of seizure and potential postictal especially given patient has a known meningioma.  Lab work-up is reassuring.  Patient is more awake.  Family ended up IVC and patient I guess patient was having episodes of agitation at home and not taking her medication not taking care of herself.  I have placed a consult for psychiatry for inpatient but I feel like patient needs work-up for syncope versus seizure episode       ____________________________________________   FINAL CLINICAL IMPRESSION(S) / ED DIAGNOSES   Final diagnoses:  Altered mental status, unspecified altered mental status type      MEDICATIONS GIVEN DURING THIS VISIT:  Medications - No data to display   ED Discharge Orders     None        Note:  This document was prepared using Dragon voice recognition software and may include unintentional  dictation errors.    Vanessa Hume, MD 09/30/20 1514    Vanessa [place], MD 09/30/20 1524

## 2020-09-30 NOTE — H&P (Signed)
History and Physical    Daisy Watkins PQZ:[id number] DOB: [birth year] DOA: 09/30/2020  Referring MD/NP/PA:   PCP: Valerie Roys, DO   Patient coming from:  The patient is coming from home.     Chief Complaint: Confusion, agitation, possible unresponsiveness  HPI: Daisy Watkins is a 87 y.o. female with medical history significant of hypertension, hyperlipidemia, diabetes mellitus, GERD, anxiety, meningioma, atrial fibrillation/DVT on Eliquis, CAD, psychosis, who presents with confusion, agitation, questionable unresponsiveness.  Per her daughter (I called her daughter by phone), since this morning patient has been more confused, agitated, screaming around.  States that the patient did not fall, just slide down from commode last night.  No significant injury.  Daughter states that patient did not pass out, but per EDP, "Patient was unresponsive when EMS got there but then started waking up and was able to follow commands but was nonverbal". When I saw pt in ED, she is confused, but knows her own name, knows that she is in hospital.  She is confused about time.  She is emotional and crying.  No hallucination.  Denies suicidal or homicidal ideations.  She moves all extremities normally.  No facial droop or slurred speech.  She has mild dry cough, denies chest pain, shortness of breath.  Patient states that sometimes that she had diarrhea recently, but no diarrhea today.  Denies nausea, vomiting or abdominal pain.  No symptoms of UTI.  Per ED physician, "there is also concern from family that she has not been taking her medication and not taking care of herself therefore IVC was placed".  Pt had MRI-brain on 09/14/20 1. No acute intracranial abnormality. 2. Left paraclinoid meningioma with vasogenic edema in the left anterior temporal lobe. There is likely invasion of the left cavernous sinus.   ED Course: pt was found to have WBC 6.0, troponin level 10, Tylenol level less than 10, salicylate level  less than 7, negative COVID PCR, alcohol level less than 10, potassium 3.2, renal function okay, temperature normal, blood pressure 148/56, heart rate 94, RR 17, oxygen saturation 98% on room air.  Chest x-ray negative.  Patient is placed on MedSurg bed for observation. Patient is IVC'ed. Message sent to psychiatrist, Gust Rung for consultation.  CT-head today 1. Stable head CT. 2. No significant change in the left paraclinoid meningioma. Stable vasogenic edema involving the left temporal lobe.  Review of Systems:   General: no fevers, chills, no body weight gain, has poor appetite, has fatigue HEENT: no blurry vision, hearing changes or sore throat Respiratory: no dyspnea, coughing, wheezing CV: no chest pain, no palpitations GI: no nausea, vomiting, abdominal pain, diarrhea, constipation GU: no dysuria, burning on urination, increased urinary frequency, hematuria  Ext: no leg edema Neuro: no unilateral weakness, numbness, or tingling, no vision change or hearing loss. Has confusion, agitation, possible unresponsiveness Skin: no rash, no skin tear. MSK: No muscle spasm, no deformity, no limitation of range of movement in spin Heme: No easy bruising.  Travel history: No recent long distant travel. Psychiatry: Patient is emotional, no suicidal or homicidal ideations.  Allergy:  Allergies  Allergen Reactions   Ciprofloxacin Palpitations    Severe Headache   Clindamycin/Lincomycin Nausea Only    Made her head hurt   Elemental Sulfur Itching   Hctz [Hydrochlorothiazide] Other (See Comments)    Abdominal pain   Doxycycline Hyclate Palpitations    Past Medical History:  Diagnosis Date   Anxiety    Constipation 08/17/2015  Diabetes mellitus without complication ([place])    DVT (deep venous thrombosis) ([place]) 2006   chronic anticoagulation   Encounter for monitoring Coumadin therapy 12/16/2016   GERD (gastroesophageal reflux disease)    Hyperlipidemia    Hypertension    Insomnia     Monitoring for anticoagulant use 09/21/2014   Goal INR 2-3; recurrent DVTs, paroxysmal atrial fibrillation.  Unable to take Xaralto   Myalgia 02/23/2017   Sebaceous cyst 06/11/2015   Skin lesion of back 09/05/2016   Trapezius muscle spasm 05/22/2017   Urinary frequency 11/12/2016   Vitamin B12 deficiency    Vitamin D deficiency disease     Past Surgical History:  Procedure Laterality Date   ABDOMINAL HYSTERECTOMY      Social History:  reports that she quit smoking about 43 years ago. Her smoking use included cigarettes. She has a 15.00 pack-year smoking history. She has never used smokeless tobacco. She reports that she does not drink alcohol and does not use drugs.  Family History:  Family History  Problem Relation Age of Onset   Diabetes Maternal Grandmother    Hypertension Mother    Cancer Daughter        breast   Heart disease Son    Heart disease Son      Prior to Admission medications   Medication Sig Start Date End Date Taking? Authorizing Provider  acetaminophen (TYLENOL) 500 MG tablet Take 500 mg every 6 (six) hours as needed by mouth. As needed    [provider]  apixaban (ELIQUIS) 5 MG TABS tablet Take 1 tablet (5 mg total) by mouth 2 (two) times daily. 09/24/20   Johnson, Megan P, DO  LORazepam (ATIVAN) 0.5 MG tablet Take 1 tablet (0.5 mg total) by mouth at bedtime as needed. 09/24/20   Johnson, Megan P, DO  ramipril (ALTACE) 5 MG capsule Take 1 capsule (5 mg total) by mouth daily. 09/24/20   Johnson, Megan P, DO  Semaglutide, 1 MG/DOSE, 4 MG/3ML SOPN Inject 0.25 mg into the skin once a week for 28 days, THEN 0.5 mg once a week for 28 days, THEN 1 mg once a week for 28 days. 09/24/20 12/17/20  Johnson, Megan P, DO  Semaglutide, 1 MG/DOSE, 4 MG/3ML SOPN Inject 1 mg into the skin once a week. 09/24/20 10/24/20  Valerie Roys, DO  TRUETRACK TEST test strip  12/29/17   [provider]    Physical Exam: Vitals:   09/30/20 1400 09/30/20 1430 09/30/20 1500  09/30/20 1530  BP: (!) 148/71 (!) 143/66 (!) 148/56 (!) 159/76  Pulse: 74 60 61 78  Resp:      Temp:      TempSrc:      SpO2: 96% 97% 98% 100%  Weight:      Height:       General: Not in acute distress HEENT:       Eyes: PERRL, EOMI, no scleral icterus.       ENT: No discharge from the ears and nose       Neck: No JVD, no bruit, no mass felt. Heme: No neck lymph node enlargement. Cardiac: S1/S2, RRR, No murmurs, No gallops or rubs. Respiratory: No rales, wheezing, rhonchi or rubs. GI: Soft, nondistended, nontender, no organomegaly, BS present. GU: No hematuria Ext: has trace leg edema bilaterally. 1+DP/PT pulse bilaterally. Musculoskeletal: No joint deformities, No joint redness or warmth, no limitation of ROM in spin. Skin: No rashes.  Neuro: Alert, confused, knows her own name, knows that  she is in hospital, not orientated to time, cranial nerves II-XII grossly intact, moves all extremities normally. Psych: Patient is emotional, crying, no suicidal or homicidal ideations.  Labs on Admission: I have personally reviewed following labs and imaging studies  CBC: Recent Labs  Lab 09/30/20 1033  WBC 6.0  NEUTROABS 4.5  HGB 14.8  HCT 44.0  MCV 85.6  PLT 163   Basic Metabolic Panel: Recent Labs  Lab 09/30/20 1033  NA 138  K 3.2*  CL 95*  CO2 29  GLUCOSE 343*  BUN 13  CREATININE 0.63  CALCIUM 10.0  MG 1.9   GFR: Estimated Creatinine Clearance: 52.1 mL/min (by C-G formula based on SCr of 0.63 mg/dL). Liver Function Tests: Recent Labs  Lab 09/30/20 1033  AST 34  ALT 28  ALKPHOS 100  BILITOT 1.9*  PROT 7.1  ALBUMIN 4.0   No results for input(s): LIPASE, AMYLASE in the last 168 hours. No results for input(s): AMMONIA in the last 168 hours. Coagulation Profile: No results for input(s): INR, PROTIME in the last 168 hours. Cardiac Enzymes: No results for input(s): CKTOTAL, CKMB, CKMBINDEX, TROPONINI in the last 168 hours. BNP (last 3 results) No results for  input(s): PROBNP in the last 8760 hours. HbA1C: No results for input(s): HGBA1C in the last 72 hours. CBG: Recent Labs  Lab 09/30/20 1037  GLUCAP 327*   Lipid Profile: No results for input(s): CHOL, HDL, LDLCALC, TRIG, CHOLHDL, LDLDIRECT in the last 72 hours. Thyroid Function Tests: No results for input(s): TSH, T4TOTAL, FREET4, T3FREE, THYROIDAB in the last 72 hours. Anemia Panel: No results for input(s): VITAMINB12, FOLATE, FERRITIN, TIBC, IRON, RETICCTPCT in the last 72 hours. Urine analysis:    Component Value Date/Time   COLORURINE AMBER (A) 09/14/2020 1450   APPEARANCEUR HAZY (A) 09/14/2020 1450   APPEARANCEUR Hazy (A) 03/21/2020 1613   LABSPEC 1.026 09/14/2020 1450   LABSPEC 1.024 01/27/2014 1511   PHURINE 5.0 09/14/2020 1450   GLUCOSEU >=500 (A) 09/14/2020 1450   GLUCOSEU >=500 01/27/2014 1511   HGBUR LARGE (A) 09/14/2020 1450   BILIRUBINUR NEGATIVE 09/14/2020 1450   BILIRUBINUR Negative 03/21/2020 1613   BILIRUBINUR Negative 01/27/2014 1511   KETONESUR 80 (A) 09/14/2020 1450   PROTEINUR 30 (A) 09/14/2020 1450   NITRITE NEGATIVE 09/14/2020 1450   LEUKOCYTESUR NEGATIVE 09/14/2020 1450   LEUKOCYTESUR Negative 01/27/2014 1511   Sepsis Labs: @LABRCNTIP (procalcitonin:4,lacticidven:4) ) Recent Results (from the past 240 hour(s))  Resp Panel by RT-PCR (Flu A&B, Covid) Nasopharyngeal Swab     Status: None   Collection Time: 09/30/20 10:38 AM   Specimen: Nasopharyngeal Swab; Nasopharyngeal(NP) swabs in vial transport medium  Result Value Ref Range Status   SARS Coronavirus 2 by RT PCR NEGATIVE NEGATIVE Final    Comment: (NOTE) SARS-CoV-2 target nucleic acids are NOT DETECTED.  The SARS-CoV-2 RNA is generally detectable in upper respiratory specimens during the acute phase of infection. The lowest concentration of SARS-CoV-2 viral copies this assay can detect is 138 copies/mL. A negative result does not preclude SARS-Cov-2 infection and should not be used as the sole  basis for treatment or other patient management decisions. A negative result may occur with  improper specimen collection/handling, submission of specimen other than nasopharyngeal swab, presence of viral mutation(s) within the areas targeted by this assay, and inadequate number of viral copies(<138 copies/mL). A negative result must be combined with clinical observations, patient history, and epidemiological information. The expected result is Negative.  Fact Sheet for Patients:  EntrepreneurPulse.com.au  Fact  Sheet for Healthcare Providers:  IncredibleEmployment.be  This test is no t yet approved or cleared by the [place] FDA and  has been authorized for detection and/or diagnosis of SARS-CoV-2 by FDA under an Emergency Use Authorization (EUA). This EUA will remain  in effect (meaning this test can be used) for the duration of the COVID-19 declaration under Section 564(b)(1) of the Act, 21 U.S.C.section 360bbb-3(b)(1), unless the authorization is terminated  or revoked sooner.       Influenza A by PCR NEGATIVE NEGATIVE Final   Influenza B by PCR NEGATIVE NEGATIVE Final    Comment: (NOTE) The Xpert Xpress SARS-CoV-2/FLU/RSV plus assay is intended as an aid in the diagnosis of influenza from Nasopharyngeal swab specimens and should not be used as a sole basis for treatment. Nasal washings and aspirates are unacceptable for Xpert Xpress SARS-CoV-2/FLU/RSV testing.  Fact Sheet for Patients: EntrepreneurPulse.com.au  Fact Sheet for Healthcare Providers: IncredibleEmployment.be  This test is not yet approved or cleared by the [place] FDA and has been authorized for detection and/or diagnosis of SARS-CoV-2 by FDA under an Emergency Use Authorization (EUA). This EUA will remain in effect (meaning this test can be used) for the duration of the COVID-19 declaration under Section 564(b)(1) of the Act,  21 U.S.C. section 360bbb-3(b)(1), unless the authorization is terminated or revoked.  Performed at [facility], [address] [place], [place] [address]      Radiological Exams on Admission: DG Chest 2 View  Result Date: 09/30/2020 CLINICAL DATA:  Shortness of breath EXAM: CHEST - 2 VIEW COMPARISON:  05/31/2005 chest radiograph FINDINGS: The cardiomediastinal silhouette is unremarkable. Mild peribronchial thickening is again noted. There is no evidence of focal airspace disease, pulmonary edema, suspicious pulmonary nodule/mass, pleural effusion, or pneumothorax. No acute bony abnormalities are identified. IMPRESSION: No evidence of acute cardiopulmonary disease. Electronically Signed   By: Margarette Canada M.D.   On: 09/30/2020 11:27   CT Head Wo Contrast  Result Date: 09/30/2020 CLINICAL DATA:  87 year old with mental status change. EXAM: CT HEAD WITHOUT CONTRAST TECHNIQUE: Contiguous axial images were obtained from the base of the skull through the vertex without intravenous contrast. COMPARISON:  09/14/2020 and MRI 09/14/2020 FINDINGS: Brain: Again noted is a large calcified lesion in the left middle cranial fossa measures up to 2.1 cm. Based on previous imaging, this is compatible with a left paraclinoid meningioma. Again noted is vasogenic edema in the left temporal lobe that is similar to the prior examination. Stable mild cerebral atrophy. Mild low-density in the white matter is stable. No evidence for acute hemorrhage, midline shift, hemorrhage or large new infarct. Vascular: No hyperdense vessel or unexpected calcification. Skull: Stable appearance of the meningioma in the left middle cranial fossa with apparent involvement of the cavernous sinus. No acute bone abnormality. Sinuses/Orbits: Visualized paranasal sinuses are clear. Other: None IMPRESSION: 1. Stable head CT. 2. No significant change in the left paraclinoid meningioma. Stable vasogenic edema involving the left temporal  lobe. Electronically Signed   By: Markus Daft M.D.   On: 09/30/2020 11:15     EKG: I have personally reviewed.  Atrial fibrillation, QTC 441, LAD, LAE, nonspecific T wave change  Assessment/Plan Principal Problem:   Acute metabolic encephalopathy Active Problems:   Type 2 diabetes mellitus with hyperglycemia (HCC)   Coronary artery disease   Benign essential hypertension   Paroxysmal A-fib (HCC)   Anxiety, generalized   History of DVT (deep vein thrombosis)   Psychosis in elderly with behavioral  disturbance (HCC)   Meningioma (HCC)   Hypokalemia   Acute metabolic encephalopathy: Patient has confusion, agitation, possible unresponsiveness (daughter denies passing out, but EMS reported patient was unresponsiveness).  Clinically patient seems to have worsening symptoms of psychosis.  Patient is emotional and crying when I saw the patient in ED.  Given the history of meningioma with vasogenic edema, seizure is a potential differential diagnosis. Recent MRI-brain showed left paraclinoid meningioma with vasogenic edema in the left anterior temporal lobe. The CT-head today showed stable appearance.  -place in med-surg for obs -EEG -give 1000 mg of Keppra empirically -Seizure precaution -When necessary Ativan for seizure -Frequent neuro checks -f/u UA  Psychosis in elderly with behavioral disturbance -pt is IVC'ed -message sent to psychiatrist, Gust Rung for consultation -prn ativen  Type 2 diabetes mellitus with hyperglycemia [facility]): Recent A1c 10.2, poorly controlled.  Patient is taking semaglutide -SSI  Coronary artery disease: No chest pain.  Patient is not taking aspirin or statin -Observe closely  Benign essential hypertension -Ramipril  Paroxysmal A-fib (HCC): HR 96 -->76 -eliquis  Anxiety, generalized -prn Ativen  History of DVT (deep vein thrombosis) -Eliquis  Meningioma ([place]): Seems to be stable on CT scan -may need to follow up with neurosurgeon  Hypokalemia:  K= 3.2 on admission. - Repleted - Check Mg level    DVT ppx: on Eliquis Code Status: Full code Family Communication: Yes, patient's daughter by phone Disposition Plan:  Anticipate discharge back to previous environment Consults called: Psychiatrist, Gust Rung Admission status and Level of care: Med-Surg:  for obs    Status is: Observation  The patient remains OBS appropriate and will d/c before 2 midnights.  Dispo: The patient is from: Home              Anticipated d/c is to:  to be determined              Patient currently is not medically stable to d/c.   Difficult to place patient No          Date of Service 09/30/2020    Ivor Costa [facility]   If 7PM-7AM, please contact night-coverage [email or web address] 09/30/2020, 5:03 PM

## 2020-09-30 NOTE — ED Provider Notes (Signed)
HPI: Pt is a 87 y.o. female who presents with complaints of  AMS.     PT with anxiety, DVT, diabetes mellitus, GERD, insomnia and hypertension, no meningioma who comes in with episode of unresponsiveness.  Patient was reportedly getting very agitated with family.  They then later found her sitting in her chair and she was unresponsive to them.  When EMS got there she was initially unresponsive.  Then she started being able to follow commands but was nonverbal.  Upon arrival to me patient was verbal without any slurring of speech.   ROS: Denies fever, chest pain, vomiting  Past Medical History:  Diagnosis Date   Anxiety    Constipation 08/17/2015   Diabetes mellitus without complication ([place])    DVT (deep venous thrombosis) ([place]) 2006   chronic anticoagulation   Encounter for monitoring Coumadin therapy 12/16/2016   GERD (gastroesophageal reflux disease)    Hyperlipidemia    Hypertension    Insomnia    Monitoring for anticoagulant use 09/21/2014   Goal INR 2-3; recurrent DVTs, paroxysmal atrial fibrillation.  Unable to take Xaralto   Myalgia 02/23/2017   Sebaceous cyst 06/11/2015   Skin lesion of back 09/05/2016   Trapezius muscle spasm 05/22/2017   Urinary frequency 11/12/2016   Vitamin B12 deficiency    Vitamin D deficiency disease    There were no vitals filed for this visit.  Focused Physical Exam: Gen: No acute distress Head: atraumatic, normocephalic Eyes: Extraocular movements grossly intact; conjunctiva clear CV: RRR Lung: No increased WOB, no stridor GI: ND, no obvious masses Neuro: Alert and awake x2, equal strength in arms and legs.  No pronator drift.  Cranial nerves II through XII are intact  Medical Decision Making and Plan: Given the patient's initial medical screening exam, the following diagnostic evaluation has been ordered. The patient will be placed in the appropriate treatment space, once one is available, to complete the evaluation and treatment. I have  discussed the plan of care with the patient and I have advised the patient that an ED physician or mid-level practitioner will reevaluate their condition after the test results have been received, as the results may give them additional insight into the type of treatment they may need.   Diagnostics: NIH stroke scale is 0.  We will proceed with CT imaging, lab work-up.  Consider the possibility of seizure and will continue to closely monitor.  Unfortunately this time no beds are available in the ER therefore patient was placed in triage  Treatments: none    Vanessa Troy, MD 09/30/20 1017

## 2020-10-01 ENCOUNTER — Encounter: Payer: Self-pay | Admitting: Family Medicine

## 2020-10-01 DIAGNOSIS — G936 Cerebral edema: Secondary | ICD-10-CM | POA: Diagnosis present

## 2020-10-01 DIAGNOSIS — I480 Paroxysmal atrial fibrillation: Secondary | ICD-10-CM | POA: Diagnosis present

## 2020-10-01 DIAGNOSIS — G4700 Insomnia, unspecified: Secondary | ICD-10-CM | POA: Diagnosis present

## 2020-10-01 DIAGNOSIS — R4182 Altered mental status, unspecified: Secondary | ICD-10-CM | POA: Diagnosis present

## 2020-10-01 DIAGNOSIS — E1165 Type 2 diabetes mellitus with hyperglycemia: Secondary | ICD-10-CM | POA: Diagnosis present

## 2020-10-01 DIAGNOSIS — F411 Generalized anxiety disorder: Secondary | ICD-10-CM

## 2020-10-01 DIAGNOSIS — Z7901 Long term (current) use of anticoagulants: Secondary | ICD-10-CM | POA: Diagnosis not present

## 2020-10-01 DIAGNOSIS — F0391 Unspecified dementia with behavioral disturbance: Secondary | ICD-10-CM | POA: Diagnosis present

## 2020-10-01 DIAGNOSIS — Z79899 Other long term (current) drug therapy: Secondary | ICD-10-CM | POA: Diagnosis not present

## 2020-10-01 DIAGNOSIS — R5383 Other fatigue: Secondary | ICD-10-CM | POA: Diagnosis not present

## 2020-10-01 DIAGNOSIS — D329 Benign neoplasm of meninges, unspecified: Secondary | ICD-10-CM | POA: Diagnosis not present

## 2020-10-01 DIAGNOSIS — Z794 Long term (current) use of insulin: Secondary | ICD-10-CM | POA: Diagnosis not present

## 2020-10-01 DIAGNOSIS — G9341 Metabolic encephalopathy: Secondary | ICD-10-CM | POA: Diagnosis present

## 2020-10-01 DIAGNOSIS — G9389 Other specified disorders of brain: Secondary | ICD-10-CM | POA: Diagnosis not present

## 2020-10-01 DIAGNOSIS — Z8249 Family history of ischemic heart disease and other diseases of the circulatory system: Secondary | ICD-10-CM | POA: Diagnosis not present

## 2020-10-01 DIAGNOSIS — I10 Essential (primary) hypertension: Secondary | ICD-10-CM | POA: Diagnosis present

## 2020-10-01 DIAGNOSIS — Z888 Allergy status to other drugs, medicaments and biological substances status: Secondary | ICD-10-CM | POA: Diagnosis not present

## 2020-10-01 DIAGNOSIS — E11649 Type 2 diabetes mellitus with hypoglycemia without coma: Secondary | ICD-10-CM | POA: Diagnosis not present

## 2020-10-01 DIAGNOSIS — R569 Unspecified convulsions: Secondary | ICD-10-CM | POA: Diagnosis present

## 2020-10-01 DIAGNOSIS — Z87891 Personal history of nicotine dependence: Secondary | ICD-10-CM | POA: Diagnosis not present

## 2020-10-01 DIAGNOSIS — F32A Depression, unspecified: Secondary | ICD-10-CM | POA: Diagnosis present

## 2020-10-01 DIAGNOSIS — Z881 Allergy status to other antibiotic agents status: Secondary | ICD-10-CM | POA: Diagnosis not present

## 2020-10-01 DIAGNOSIS — K219 Gastro-esophageal reflux disease without esophagitis: Secondary | ICD-10-CM | POA: Diagnosis present

## 2020-10-01 DIAGNOSIS — Z86718 Personal history of other venous thrombosis and embolism: Secondary | ICD-10-CM | POA: Diagnosis not present

## 2020-10-01 DIAGNOSIS — E782 Mixed hyperlipidemia: Secondary | ICD-10-CM | POA: Diagnosis present

## 2020-10-01 DIAGNOSIS — Z20822 Contact with and (suspected) exposure to covid-19: Secondary | ICD-10-CM | POA: Diagnosis present

## 2020-10-01 DIAGNOSIS — I2510 Atherosclerotic heart disease of native coronary artery without angina pectoris: Secondary | ICD-10-CM | POA: Diagnosis present

## 2020-10-01 DIAGNOSIS — R410 Disorientation, unspecified: Secondary | ICD-10-CM | POA: Diagnosis not present

## 2020-10-01 DIAGNOSIS — Z803 Family history of malignant neoplasm of breast: Secondary | ICD-10-CM | POA: Diagnosis not present

## 2020-10-01 DIAGNOSIS — E876 Hypokalemia: Secondary | ICD-10-CM | POA: Diagnosis present

## 2020-10-01 DIAGNOSIS — Z833 Family history of diabetes mellitus: Secondary | ICD-10-CM | POA: Diagnosis not present

## 2020-10-01 LAB — BASIC METABOLIC PANEL
Anion gap: 11 (ref 5–15)
BUN: 10 mg/dL (ref 8–23)
CO2: 27 mmol/L (ref 22–32)
Calcium: 9.2 mg/dL (ref 8.9–10.3)
Chloride: 102 mmol/L (ref 98–111)
Creatinine, Ser: 0.61 mg/dL (ref 0.44–1.00)
GFR, Estimated: >60 mL/min (ref >60)
Glucose, Bld: 178 mg/dL — ABNORMAL HIGH (ref 70–99)
Potassium: 3.1 mmol/L — ABNORMAL LOW (ref 3.5–5.1)
Sodium: 140 mmol/L (ref 135–145)

## 2020-10-01 LAB — CBG MONITORING, ED
Glucose-Capillary: 174 mg/dL — ABNORMAL HIGH (ref 70–99)
Glucose-Capillary: 190 mg/dL — ABNORMAL HIGH (ref 70–99)

## 2020-10-01 LAB — AMMONIA: Ammonia: 28 umol/L (ref 9–35)

## 2020-10-01 LAB — GLUCOSE, CAPILLARY
Glucose-Capillary: 204 mg/dL — ABNORMAL HIGH (ref 70–99)
Glucose-Capillary: 149 mg/dL — ABNORMAL HIGH (ref 70–99)

## 2020-10-01 LAB — FOLATE: Folate: 10.5 ng/mL (ref >5.9)

## 2020-10-01 LAB — VITAMIN B12: Vitamin B-12: 283 pg/mL (ref 180–914)

## 2020-10-01 MED ORDER — POTASSIUM CHLORIDE CRYS ER 20 MEQ PO TBCR
40.0000 meq | EXTENDED_RELEASE_TABLET | Freq: Once | ORAL | Status: AC
  Administered 2020-10-01: 18:00:00 40 meq via ORAL
  Filled 2020-10-01: qty 2

## 2020-10-01 MED ORDER — DIVALPROEX SODIUM ER 250 MG PO TB24
250.0000 mg | ORAL_TABLET | Freq: Every day | ORAL | Status: DC
  Administered 2020-10-01 – 2020-10-04 (×4): 250 mg via ORAL
  Filled 2020-10-01 (×4): qty 1

## 2020-10-01 MED ORDER — LEVETIRACETAM IN NACL 500 MG/100ML IV SOLN
500.0000 mg | Freq: Two times a day (BID) | INTRAVENOUS | Status: DC
  Administered 2020-10-01: 500 mg via INTRAVENOUS
  Filled 2020-10-01 (×2): qty 100

## 2020-10-01 MED ORDER — ESCITALOPRAM OXALATE 10 MG PO TABS
5.0000 mg | ORAL_TABLET | Freq: Every day | ORAL | Status: DC
  Administered 2020-10-01 – 2020-10-04 (×4): 5 mg via ORAL
  Filled 2020-10-01 (×4): qty 0.5

## 2020-10-01 MED ORDER — HYDROXYZINE HCL 10 MG PO TABS
10.0000 mg | ORAL_TABLET | Freq: Three times a day (TID) | ORAL | Status: DC | PRN
  Filled 2020-10-01: qty 1

## 2020-10-01 MED ORDER — LEVETIRACETAM 500 MG PO TABS: 500.0000 mg | ORAL_TABLET | Freq: Two times a day (BID) | ORAL | Status: DC

## 2020-10-01 NOTE — ED Notes (Signed)
Attending MD notified by chat that pt keeps removing PIVs and at this time has no PIV.

## 2020-10-01 NOTE — Progress Notes (Signed)
PROGRESS NOTE    Daisy Watkins  XBL:[id number] DOB: 12/07/1932 DOA: 09/30/2020 PCP: Valerie Roys, DO   Chief Complaint  Patient presents with   Altered Mental Status   Brief Narrative:  Daisy Watkins is [name] 87 y.o. female with medical history significant of hypertension, hyperlipidemia, diabetes mellitus, GERD, anxiety, meningioma, atrial fibrillation/DVT on Eliquis, CAD, psychosis, who presents with confusion, agitation, questionable unresponsiveness.   Per her daughter (I called her daughter by phone), since this morning patient has been more confused, agitated, screaming around.  States that the patient did not fall, just slide down from commode last night.  No significant injury.  Daughter states that patient did not pass out, but per EDP, "Patient was unresponsive when EMS got there but then started waking up and was able to follow commands but was nonverbal". When I saw pt in ED, she is confused, but knows her own name, knows that she is in hospital.  She is confused about time.  She is emotional and crying.  No hallucination.  Denies suicidal or homicidal ideations.  She moves all extremities normally.  No facial droop or slurred speech.  She has mild dry cough, denies chest pain, shortness of breath.  Patient states that sometimes that she had diarrhea recently, but no diarrhea today.  Denies nausea, vomiting or abdominal pain.  No symptoms of UTI.  Per ED physician, "there is also concern from family that she has not been taking her medication and not taking care of herself therefore IVC was placed".   Pt had MRI-brain on 09/14/20 1. No acute intracranial abnormality. 2. Left paraclinoid meningioma with vasogenic edema in the left anterior temporal lobe. There is likely invasion of the left cavernous sinus.   ED Course: pt was found to have WBC 6.0, troponin level 10, Tylenol level less than 10, salicylate level less than 7, negative COVID PCR, alcohol level less than 10, potassium 3.2,  renal function okay, temperature normal, blood pressure 148/56, heart rate 94, RR 17, oxygen saturation 98% on room air.  Chest x-ray negative.  Patient is placed on MedSurg bed for observation. Patient is IVC'ed. Message sent to psychiatrist, Gust Rung for consultation.   CT-head today 1. Stable head CT. 2. No significant change in the left paraclinoid meningioma. Stable vasogenic edema involving the left temporal lobe.     Assessment & Plan:   Principal Problem:   Acute metabolic encephalopathy Active Problems:   Type 2 diabetes mellitus with hyperglycemia (HCC)   Coronary artery disease   Benign essential hypertension   Paroxysmal Kearie Mennen-fib (HCC)   Anxiety, generalized   History of DVT (deep vein thrombosis)   Psychosis in elderly with behavioral disturbance (HCC)   Meningioma (HCC)   Hypokalemia  Acute metabolic encephalopathy: Patient has confusion, agitation, possible unresponsiveness (daughter denies passing out, but EMS reported patient was unresponsiveness).  Clinically patient seems to have worsening symptoms of psychosis.  Patient is emotional and crying when I saw the patient in ED.  Given the history of meningioma with vasogenic edema, seizure is Zyeir Dymek potential differential diagnosis.  - MRI brain 6/17 with L paraclinoid meningioma with vasogenic edema in the left anterior temporal lobe, likely invasion of the L cavernous sinus - CT head stable from 7/3, no significant change in L paraclinoid meningioma, stable vasogenic edema involving -EEG pending -give 1000 mg of Keppra empirically -> transitioned to depakote per Neurology (appreciate assistance) -Seizure precaution -When necessary Ativan for seizure -Frequent neuro checks -f/u UA, many  bacteria - negative nitrite and WBC - follow culture.  Follow ammonia, b12, folate.   Psychosis in elderly with behavioral disturbance -psychiatry evaluated and not Tzipora Mcinroy risk to self or others - IVC rescinded - lexapro 5 mg daily, hydroxyzine  10 mg TID prn   Type 2 diabetes mellitus with hyperglycemia ([place]): Recent A1c 10.2, poorly controlled.  Patient is taking semaglutide -SSI   Coronary artery disease: No chest pain.  Patient is not taking aspirin or statin -Observe closely   Benign essential hypertension -Ramipril   Paroxysmal Jakayla Schweppe-fib (HCC): HR 96 -->76 -eliquis   Anxiety, generalized -prn Ativen   History of DVT (deep vein thrombosis) -Eliquis   Meningioma ([place]): Seems to be stable on CT scan -may need to follow up with neurosurgeon   Hypokalemia: K= 3.2 on admission. - Repleted - Check Mg level   DVT prophylaxis: eliquis  Code Status: full  Family Communication: none at bedside Disposition:   Status is: Observation  The patient will require care spanning > 2 midnights and should be moved to inpatient because: Inpatient level of care appropriate due to severity of illness  Dispo: The patient is from: Home              Anticipated d/c is to: Home              Patient currently is not medically stable to d/c.   Difficult to place patient No       Consultants:  Psych neurology  Procedures:  none  Antimicrobials:  Anti-infectives (From admission, onward)    None          Subjective: confused  Objective: Vitals:   10/01/20 1040 10/01/20 1425 10/01/20 1429 10/01/20 1550  BP: (!) 122/52 (!) 124/50  (!) 114/55  Pulse: 68 (!) 101  68  Resp: 18 16    Temp: 99.1 F (37.3 C)  98.1 F (36.7 C) 98.1 F (36.7 C)  TempSrc:   Oral   SpO2: 95% 100%  98%  Weight:      Height:        Intake/Output Summary (Last 24 hours) at 10/01/2020 1558 Last data filed at 10/01/2020 1121 Gross per 24 hour  Intake 1180.46 ml  Output --  Net 1180.46 ml   Filed Weights   09/30/20 1031  Weight: 74.5 kg    Examination:  General exam: Appears calm and comfortable  Respiratory system: Clear to auscultation. Respiratory effort normal. Cardiovascular system: S1 & S2 heard, RRR.  Gastrointestinal  system: Abdomen is nondistended, soft and nontender.  Central nervous system: Alert, but confused.  Calls me travis.  No focal deficit, aggressive at times, waves me away, twists my fingers when I ask her to squeeze my fingers.  Extremities: no LEE  Data Reviewed: I have personally reviewed following labs and imaging studies  CBC: Recent Labs  Lab 09/30/20 1033  WBC 6.0  NEUTROABS 4.5  HGB 14.8  HCT 44.0  MCV 85.6  PLT 256    Basic Metabolic Panel: Recent Labs  Lab 09/30/20 1033 10/01/20 0659  NA 138 140  K 3.2* 3.1*  CL 95* 102  CO2 29 27  GLUCOSE 343* 178*  BUN 13 10  CREATININE 0.63 0.61  CALCIUM 10.0 9.2  MG 1.9  --     GFR: Estimated Creatinine Clearance: 52.1 mL/min (by C-G formula based on SCr of 0.61 mg/dL).  Liver Function Tests: Recent Labs  Lab 09/30/20 1033  AST 34  ALT 28  ALKPHOS 100  BILITOT 1.9*  PROT 7.1  ALBUMIN 4.0    CBG: Recent Labs  Lab 09/30/20 1037 09/30/20 1741 09/30/20 2203 10/01/20 0741 10/01/20 1344  GLUCAP 327* 274* 169* 174* 190*     Recent Results (from the past 240 hour(s))  Resp Panel by RT-PCR (Flu Laterrance Nauta&B, Covid) Nasopharyngeal Swab     Status: None   Collection Time: 09/30/20 10:38 AM   Specimen: Nasopharyngeal Swab; Nasopharyngeal(NP) swabs in vial transport medium  Result Value Ref Range Status   SARS Coronavirus 2 by RT PCR NEGATIVE NEGATIVE Final    Comment: (NOTE) SARS-CoV-2 target nucleic acids are NOT DETECTED.  The SARS-CoV-2 RNA is generally detectable in upper respiratory specimens during the acute phase of infection. The lowest concentration of SARS-CoV-2 viral copies this assay can detect is 138 copies/mL. Jonia Oakey negative result does not preclude SARS-Cov-2 infection and should not be used as the sole basis for treatment or other patient management decisions. Jahmire Ruffins negative result may occur with  improper specimen collection/handling, submission of specimen other than nasopharyngeal swab, presence of viral  mutation(s) within the areas targeted by this assay, and inadequate number of viral copies(<138 copies/mL). Tobey Schmelzle negative result must be combined with clinical observations, patient history, and epidemiological information. The expected result is Negative.  Fact Sheet for Patients:  EntrepreneurPulse.com.au  Fact Sheet for Healthcare Providers:  IncredibleEmployment.be  This test is no t yet approved or cleared by the [place] FDA and  has been authorized for detection and/or diagnosis of SARS-CoV-2 by FDA under an Emergency Use Authorization (EUA). This EUA will remain  in effect (meaning this test can be used) for the duration of the COVID-19 declaration under Section 564(b)(1) of the Act, 21 U.S.C.section 360bbb-3(b)(1), unless the authorization is terminated  or revoked sooner.       Influenza Krishauna Schatzman by PCR NEGATIVE NEGATIVE Final   Influenza B by PCR NEGATIVE NEGATIVE Final    Comment: (NOTE) The Xpert Xpress SARS-CoV-2/FLU/RSV plus assay is intended as an aid in the diagnosis of influenza from Nasopharyngeal swab specimens and should not be used as Randall Colden sole basis for treatment. Nasal washings and aspirates are unacceptable for Xpert Xpress SARS-CoV-2/FLU/RSV testing.  Fact Sheet for Patients: EntrepreneurPulse.com.au  Fact Sheet for Healthcare Providers: IncredibleEmployment.be  This test is not yet approved or cleared by the [place] FDA and has been authorized for detection and/or diagnosis of SARS-CoV-2 by FDA under an Emergency Use Authorization (EUA). This EUA will remain in effect (meaning this test can be used) for the duration of the COVID-19 declaration under Section 564(b)(1) of the Act, 21 U.S.C. section 360bbb-3(b)(1), unless the authorization is terminated or revoked.  Performed at [facility], [address] [place], [place] [address]          Radiology  Studies: DG Chest 2 View  Result Date: 09/30/2020 CLINICAL DATA:  Shortness of breath EXAM: CHEST - 2 VIEW COMPARISON:  05/31/2005 chest radiograph FINDINGS: The cardiomediastinal silhouette is unremarkable. Mild peribronchial thickening is again noted. There is no evidence of focal airspace disease, pulmonary edema, suspicious pulmonary nodule/mass, pleural effusion, or pneumothorax. No acute bony abnormalities are identified. IMPRESSION: No evidence of acute cardiopulmonary disease. Electronically Signed   By: Margarette Canada M.D.   On: 09/30/2020 11:27   CT Head Wo Contrast  Result Date: 09/30/2020 CLINICAL DATA:  87 year old with mental status change. EXAM: CT HEAD WITHOUT CONTRAST TECHNIQUE: Contiguous axial images were obtained from the base of the skull through the vertex without intravenous contrast.  COMPARISON:  09/14/2020 and MRI 09/14/2020 FINDINGS: Brain: Again noted is Persephone Schriever large calcified lesion in the left middle cranial fossa measures up to 2.1 cm. Based on previous imaging, this is compatible with Raydan Schlabach left paraclinoid meningioma. Again noted is vasogenic edema in the left temporal lobe that is similar to the prior examination. Stable mild cerebral atrophy. Mild low-density in the white matter is stable. No evidence for acute hemorrhage, midline shift, hemorrhage or large new infarct. Vascular: No hyperdense vessel or unexpected calcification. Skull: Stable appearance of the meningioma in the left middle cranial fossa with apparent involvement of the cavernous sinus. No acute bone abnormality. Sinuses/Orbits: Visualized paranasal sinuses are clear. Other: None IMPRESSION: 1. Stable head CT. 2. No significant change in the left paraclinoid meningioma. Stable vasogenic edema involving the left temporal lobe. Electronically Signed   By: Markus Daft M.D.   On: 09/30/2020 11:15        Scheduled Meds:  apixaban  5 mg Oral BID   divalproex  250 mg Oral Daily   escitalopram  5 mg Oral Daily   insulin  aspart  0-15 Units Subcutaneous TID WC   insulin aspart  0-5 Units Subcutaneous QHS   ramipril  5 mg Oral Daily   Continuous Infusions:   LOS: 0 days    Time spent: over 30 min    Fayrene Helper, MD [facility]   To contact the attending provider between 7A-7P or the covering provider during after hours 7P-7A, please log into the web site [email or web address] and access using universal [place] password for that web site. If you do not have the password, please call the hospital operator.  10/01/2020, 3:58 PM

## 2020-10-01 NOTE — ED Notes (Signed)
Pt. Sleeping in bed. Chest rise and fall visible.

## 2020-10-01 NOTE — ED Notes (Signed)
IVC PAPERS  RESCINDED PER  JAMIE  LORD  INFORMED  BILL  RN

## 2020-10-01 NOTE — ED Notes (Signed)
IVC/pending psych consult 

## 2020-10-01 NOTE — Consult Note (Signed)
NEUROLOGY CONSULTATION NOTE   Date of service: October 01, 2020 Patient Name: Daisy Watkins MRN:  [id number] DOB:  1932-10-23 Reason for consult: encephalopathy in setting of L anterior temporal meningioma Requesting physician: Fayrene Helper MD _ _ _   _ __   _ __ _ _  __ __   _ __   __ _  History of Present Illness    IThis is an 87 year old woman with past medical history significant for hypertension, hyperlipidemia, diabetes, anxiety, recent diagnosis of left anterior temporal meningioma, atrial fibrillation and DVT on Eliquis, CAD, recent episodes of psychosis with fluctuating mental status who presents with confusion, agitation, and questionable unresponsiveness.  Patient is unable to provide history due to encephalopathy.  The admitting hospitalist did call her daughter who said that since the morning of admission patient had been more confused, agitated, and intermittently screaming.  The night before patient apparently slid off the commode without significant injury.  Daughter states the patient did not pass out however per EMS patient was unresponsive on arrival but then began to wake up and was able to follow commands but was nonverbal.  In the ED patient was confused but did not know her name and know that she was in the hospital.  She was not oriented to time.  She was emotional and crying although denied hallucinations at that time.  No suicidal or homicidal ideations.  No focal weakness observed, no facial droop, no dysarthria.  She did endorse some mild dry cough.  Denied chest pain or shortness of breath.  Reported recent diarrhea although not on the day of admission.  No nausea, vomiting, or abdominal pain.  No dysuria.  Family reported concern that she had not been taking care of herself recently and not been taking her medication appropriately therefore IVC was put in place.  Basic labs and ED were unremarkable, WBC 6.0, negative COVID PCR, normal renal function.  UA not concerning for  infection. Vital signs were stable and normal.  Psychiatry consultation is pending. Due to concern for possible seizure as etiology patient was loaded with 1g keppra and started on 500mg  bid.  She was recently admitted 3 weeks ago for fluctuating confusion as well as gradual overall decline and decreased po intake over the preceding few weeks. Neurosurgery was consulted for new dx L anterior temporal meningioma with associated vasogenic edema. They did not recommend surgical intervention but recommended consultation with rad onc as outpatient to evaluate appropriateness of radiation therapy. No steroids were recommended.   On examination today she is fully oriented but has 0/3 delayed recall. There is food on the floor and all of her bedsheets and blankets are also on the floor. When I asked her how that happened she said "I did what I had to do."   CT head wo 09/30/20 Stable appearance L paraclinoid meningioma with vasogenic edema in L anterior temporal lobe  MRI brain wwo 09/14/20 1. No acute intracranial abnormality. 2. Left paraclinoid meningioma with vasogenic edema in the left anterior temporal lobe. There is likely invasion of the left cavernous sinus.  CNS imaging personally reviewed  EEG ordered and is pending   ROS   Unable to perform 2/2 encephalopathy. Patient does not recall hospitalization 3 wks ago.  Past History   Past Medical History:  Diagnosis Date   Anxiety    Constipation 08/17/2015   Diabetes mellitus without complication ([place])    DVT (deep venous thrombosis) ([place]) 2006   chronic anticoagulation  Encounter for monitoring Coumadin therapy 12/16/2016   GERD (gastroesophageal reflux disease)    Hyperlipidemia    Hypertension    Insomnia    Monitoring for anticoagulant use 09/21/2014   Goal INR 2-3; recurrent DVTs, paroxysmal atrial fibrillation.  Unable to take Xaralto   Myalgia 02/23/2017   Sebaceous cyst 06/11/2015   Skin lesion of back 09/05/2016   Trapezius  muscle spasm 05/22/2017   Urinary frequency 11/12/2016   Vitamin B12 deficiency    Vitamin D deficiency disease    Past Surgical History:  Procedure Laterality Date   ABDOMINAL HYSTERECTOMY     Family History  Problem Relation Age of Onset   Diabetes Maternal Grandmother    Hypertension Mother    Cancer Daughter        breast   Heart disease Son    Heart disease Son    Social History   Socioeconomic History   Marital status: Divorced    Spouse name: Not on file   Number of children: Not on file   Years of education: Not on file   Highest education level: GED or equivalent  Occupational History   Not on file  Tobacco Use   Smoking status: Former    Packs/day: 0.50    Years: 30.00    Pack years: 15.00    Types: Cigarettes    Quit date: 03/31/1977    Years since quitting: 43.5   Smokeless tobacco: Never  Vaping Use   Vaping Use: Never used  Substance and Sexual Activity   Alcohol use: No    Alcohol/week: 0.0 standard drinks    Comment: rare   Drug use: No   Sexual activity: Not Currently  Other Topics Concern   Not on file  Social History Narrative   Not on file   Social Determinants of Health   Financial Resource Strain: Medium Risk   Difficulty of Paying Living Expenses: Somewhat hard  Food Insecurity: No Food Insecurity   Worried About Charity fundraiser in the Last Year: Never true   Ran Out of Food in the Last Year: Never true  Transportation Needs: No Transportation Needs   Lack of Transportation (Medical): No   Lack of Transportation (Non-Medical): No  Physical Activity: Insufficiently Active   Days of Exercise per Week: 2 days   Minutes of Exercise per Session: 20 min  Stress: Stress Concern Present   Feeling of Stress : To some extent  Social Connections: Not on file   Allergies  Allergen Reactions   Ciprofloxacin Palpitations    Severe Headache   Clindamycin/Lincomycin Nausea Only    Made her head hurt   Elemental Sulfur Itching   Hctz  [Hydrochlorothiazide] Other (See Comments)    Abdominal pain   Doxycycline Hyclate Palpitations    Medications    Current Facility-Administered Medications:    acetaminophen (TYLENOL) suppository 650 mg, 650 mg, Rectal, Q6H PRN, Ivor Costa, MD   acetaminophen (TYLENOL) tablet 650 mg, 650 mg, Oral, Q6H PRN, Ivor Costa, MD   apixaban Arne [facility]) tablet 5 mg, 5 mg, Oral, BID, Ivor Costa, MD, 5 mg at 10/01/20 0957   divalproex (DEPAKOTE ER) 24 hr tablet 250 mg, 250 mg, Oral, Daily, Derek Jack, MD   escitalopram (LEXAPRO) tablet 5 mg, 5 mg, Oral, Daily, Lord, Jamison Y, NP   hydrALAZINE (APRESOLINE) injection 5 mg, 5 mg, Intravenous, Q2H PRN, Ivor Costa, MD   hydrOXYzine (ATARAX/VISTARIL) tablet 10 mg, 10 mg, Oral, TID PRN, Patrecia Pour, NP  insulin aspart (novoLOG) injection 0-15 Units, 0-15 Units, Subcutaneous, TID WC, Ivor Costa, MD, 3 Units at 10/01/20 1432   insulin aspart (novoLOG) injection 0-5 Units, 0-5 Units, Subcutaneous, QHS, Ivor Costa, MD   ondansetron [facility]) injection 4 mg, 4 mg, Intravenous, Q8H PRN, Ivor Costa, MD   ramipril (ALTACE) capsule 5 mg, 5 mg, Oral, Daily, Ivor Costa, MD, 5 mg at 10/01/20 7035  Current Outpatient Medications:    acetaminophen (TYLENOL) 500 MG tablet, Take 500 mg every 6 (six) hours as needed by mouth. As needed, Disp: , Rfl:    apixaban (ELIQUIS) 5 MG TABS tablet, Take 1 tablet (5 mg total) by mouth 2 (two) times daily., Disp: 60 tablet, Rfl: 6   LORazepam (ATIVAN) 0.5 MG tablet, Take 1 tablet (0.5 mg total) by mouth at bedtime as needed. (Patient taking differently: Take 0.5 mg by mouth at bedtime as needed for anxiety, sedation or sleep.), Disp: 30 tablet, Rfl: 0   ramipril (ALTACE) 5 MG capsule, Take 1 capsule (5 mg total) by mouth daily., Disp: 30 capsule, Rfl: 6   Semaglutide, 1 MG/DOSE, 4 MG/3ML SOPN, Inject 0.25 mg into the skin once a week for 28 days, THEN 0.5 mg once a week for 28 days, THEN 1 mg once a week for 28 days., Disp: 5.25  mL, Rfl: 0   Semaglutide, 1 MG/DOSE, 4 MG/3ML SOPN, Inject 1 mg into the skin once a week., Disp: 3 mL, Rfl: 1     Vitals   Vitals:   09/30/20 2215 09/30/20 2300 10/01/20 0550 10/01/20 1040  BP:  (!) 152/59 (!) 133/50 (!) 122/52  Pulse: 71 68 64 68  Resp:   18 18  Temp:  98.1 F (36.7 C) (!) 97.4 F (36.3 C) 99.1 F (37.3 C)  TempSrc:  Oral Oral   SpO2: 98% 98% 97% 95%  Weight:      Height:         Body mass index is 26.5 kg/m.  Physical Exam   Physical Exam Gen: A&O x3, does not know why she is in the hospital, does not remember hospitalization 3 wks ago. 0/3 delayed recall. HEENT: Atraumatic, normocephalic;mucous membranes moist; oropharynx clear, tongue without atrophy or fasciculations. Neck: Supple, trachea midline. Resp: CTAB, no w/r/r CV: RRR, no m/g/r; nml S1 and S2. 2+ symmetric peripheral pulses. Abd: soft/NT/ND; nabs x 4 quad Extrem: Nml bulk; no cyanosis, clubbing, or edema.  Neuro: *MS: A&O x4. Follows multi-step commands.  *Speech: fluid, nondysarthric, able to name and repeat *CN:    I: Deferred   II,III: PERRLA, VFF by confrontation, optic disc exam not performed 2/2 patient cooperation.   III,IV,VI: EOMI w/o nystagmus, no ptosis   V: Sensation intact from V1 to V3 to LT   VII: Eyelid closure was full.  Smile symmetric.   VIII: Hearing intact to voice   IX,X: Voice normal, palate elevates symmetrically    XI: SCM/trap 5/5 bilat   XII: Tongue protrudes midline, no atrophy or fasciculations   *Motor:   Normal bulk.  No tremor, rigidity or bradykinesia. No pronator drift.    Strength: Dlt Bic Tri WrE WrF FgS Gr HF KnF KnE PlF DoF    Left 5 5 5 5 5 5 5 5 5 5 5 5     Right 5 5 5 5 5 5 5 5 5 5 5 5     *Sensory: Intact to light touch, pinprick, temperature vibration throughout. Symmetric. Propioception intact bilat.  No double-simultaneous extinction.  *Coordination:  Finger-to-nose, heel-to-shin, rapid alternating motions were intact. *Reflexes:  2+  and symmetric throughout without clonus; toes down-going bilat *Gait: deferred   Labs   CBC:  Recent Labs  Lab 09/30/20 1033  WBC 6.0  NEUTROABS 4.5  HGB 14.8  HCT 44.0  MCV 85.6  PLT 673    Basic Metabolic Panel:  Lab Results  Component Value Date   NA 140 10/01/2020   K 3.1 (L) 10/01/2020   CO2 27 10/01/2020   GLUCOSE 178 (H) 10/01/2020   BUN 10 10/01/2020   CREATININE 0.61 10/01/2020   CALCIUM 9.2 10/01/2020   GFRNONAA >60 10/01/2020   GFRAA 84 03/21/2020   Lipid Panel:  Lab Results  Component Value Date   LDLCALC 153 (H) 03/21/2020   HgbA1c:  Lab Results  Component Value Date   HGBA1C 10.2 (H) 09/14/2020   Urine Drug Screen:     Component Value Date/Time   LABOPIA NONE DETECTED 09/30/2020 1956   COCAINSCRNUR NONE DETECTED 09/30/2020 1956   LABBENZ NONE DETECTED 09/30/2020 1956   AMPHETMU NONE DETECTED 09/30/2020 1956   THCU NONE DETECTED 09/30/2020 1956   LABBARB NONE DETECTED 09/30/2020 1956    Alcohol Level     Component Value Date/Time   ETH <10 09/30/2020 1033     Impression   This is an 87 year old woman with past medical history significant for hypertension, hyperlipidemia, diabetes, anxiety, recent diagnosis of left anterior temporal meningioma, atrial fibrillation and DVT on Eliquis, CAD, recent episodes of psychosis with fluctuating mental status who presents with confusion, agitation, and questionable unresponsiveness. CT head showed L anterior temporal meningioma with vasogenic edema stable compared to MRI brain 6/17. Given the location of her meningioma in the L anterior temporal lobe seizures may be contributing to her fluctuating mental status and empiric tx is warranted. Given her history of psychosis and depression keppra is less favored as it may exacerbate psychiatric sx. VPA is likely a good choice for her as it would treat possible seizures and also have benefit in the setting of psychiatric disturbance. Start depakote ER 250mg   daily. Increase to 500mg  daily after 5 days. Patient also appears to have experienced an overall subacute to chronic decline and may have a component of dementia exacerbated by psychiatric disturbance. She was seen by [facility] last admission who did not think she was a surgical candidate but recommended consideration of o/p rad onc appt. Agree with psychiatry consult.  Recommendations   - Stop keppra - Start depakote ER 250mg  daily. Increase to 500mg  daily after 5 days. - EEG tomorrow (tech not in-house on holidays) - not emergent bc plan is to treat empirically for seizures. If EEG is normal that would not rule out seizures and patient should be continued on depakote. - Psychiatry consult pending - She was seen by [facility] last admission who did not think she was a surgical candidate but recommended consideration of o/p rad onc appt  Will continue to follow.  ______________________________________________________________________   Thank you for the opportunity to take part in the care of this patient. If you have any further questions, please contact the neurology consultation attending.  Signed,  Su Monks, MD [facility] [phone]  If 7pm- 7am, please page neurology on call as listed in [place].

## 2020-10-01 NOTE — ED Notes (Signed)
PIV removed by pt. This is the second iv removed by pt.

## 2020-10-01 NOTE — Consult Note (Signed)
[facility] Face-to-Face Psychiatry Consult   Reason for Consult:  anxiety, depression; poor self care Referring Physician:  EDP Patient Identification: Daisy Watkins MRN:  [id number] Principal Diagnosis: Acute metabolic encephalopathy Diagnosis:  Principal Problem:   Acute metabolic encephalopathy Active Problems:   Anxiety, generalized   Type 2 diabetes mellitus with hyperglycemia (HCC)   Coronary artery disease   Benign essential hypertension   Paroxysmal A-fib (HCC)   History of DVT (deep vein thrombosis)   Psychosis in elderly with behavioral disturbance (HCC)   Meningioma (HCC)   Hypokalemia   Total Time spent with patient: 1 hour  Subjective:   Daisy Watkins is a 87 y.o. female patient admitted with metabolic encephalopathy and UTI.  87 yo female with depression and anxiety, meningioma and other health issues.  Her family was concerned about her lack of self-care.  On assessment, she is clear and coherent.  She stated, "I'm good, how are you?"  When asked what brought her to the ED, "I got sick."  Denies any discomforts today.  When asked about her depression/mood, she reported, "Everybody has depression, it's called stress."  She reports her appetite is "good" along with sleep.  No suicidal/homicidal ideations or hallucinations.  She continues to report "lorazepam" helps her, this was discontinued months ago.  This provider recommends a SSRI and prn hydroxyzine to better assist her depression and anxiety, see treatment plan.  She does not meet criteria for psychiatric admission.  Per MD, Dr Blaine Hamper: Per her daughter (I called her daughter by phone), since this morning patient has been more confused, agitated, screaming around.  States that the patient did not fall, just slide down from commode last night.  No significant injury.  Daughter states that patient did not pass out, but per EDP, "Patient was unresponsive when EMS got there but then started waking up and was able to follow commands but  was nonverbal". When I saw pt in ED, she is confused, but knows her own name, knows that she is in hospital.  She is confused about time.  She is emotional and crying.  No hallucination.  Denies suicidal or homicidal ideations.  She moves all extremities normally.  No facial droop or slurred speech.  She has mild dry cough, denies chest pain, shortness of breath.  Patient states that sometimes that she had diarrhea recently, but no diarrhea today.  Denies nausea, vomiting or abdominal pain.  No symptoms of UTI.  Per ED physician, "there is also concern from family that she has not been taking her medication and not taking care of herself therefore IVC was placed".  HPI:  87 yo female with depression and anxiety  Past Psychiatric History: depression, anxiety  Risk to Self:  none Risk to Others:  none Prior Inpatient Therapy:  none Prior Outpatient Therapy:  none  Past Medical History:  Past Medical History:  Diagnosis Date   Anxiety    Constipation 08/17/2015   Diabetes mellitus without complication ([place])    DVT (deep venous thrombosis) ([place]) 2006   chronic anticoagulation   Encounter for monitoring Coumadin therapy 12/16/2016   GERD (gastroesophageal reflux disease)    Hyperlipidemia    Hypertension    Insomnia    Monitoring for anticoagulant use 09/21/2014   Goal INR 2-3; recurrent DVTs, paroxysmal atrial fibrillation.  Unable to take Xaralto   Myalgia 02/23/2017   Sebaceous cyst 06/11/2015   Skin lesion of back 09/05/2016   Trapezius muscle spasm 05/22/2017   Urinary frequency 11/12/2016  Vitamin B12 deficiency    Vitamin D deficiency disease     Past Surgical History:  Procedure Laterality Date   ABDOMINAL HYSTERECTOMY     Family History:  Family History  Problem Relation Age of Onset   Diabetes Maternal Grandmother    Hypertension Mother    Cancer Daughter        breast   Heart disease Son    Heart disease Son    Family Psychiatric  History: none Social History:  Social  History   Substance and Sexual Activity  Alcohol Use No   Alcohol/week: 0.0 standard drinks   Comment: rare     Social History   Substance and Sexual Activity  Drug Use No    Social History   Socioeconomic History   Marital status: Divorced    Spouse name: Not on file   Number of children: Not on file   Years of education: Not on file   Highest education level: GED or equivalent  Occupational History   Not on file  Tobacco Use   Smoking status: Former    Packs/day: 0.50    Years: 30.00    Pack years: 15.00    Types: Cigarettes    Quit date: 03/31/1977    Years since quitting: 43.5   Smokeless tobacco: Never  Vaping Use   Vaping Use: Never used  Substance and Sexual Activity   Alcohol use: No    Alcohol/week: 0.0 standard drinks    Comment: rare   Drug use: No   Sexual activity: Not Currently  Other Topics Concern   Not on file  Social History Narrative   Not on file   Social Determinants of Health   Financial Resource Strain: Medium Risk   Difficulty of Paying Living Expenses: Somewhat hard  Food Insecurity: No Food Insecurity   Worried About Charity fundraiser in the Last Year: Never true   Ran Out of Food in the Last Year: Never true  Transportation Needs: No Transportation Needs   Lack of Transportation (Medical): No   Lack of Transportation (Non-Medical): No  Physical Activity: Insufficiently Active   Days of Exercise per Week: 2 days   Minutes of Exercise per Session: 20 min  Stress: Stress Concern Present   Feeling of Stress : To some extent  Social Connections: Not on file   Additional Social History:    Allergies:   Allergies  Allergen Reactions   Ciprofloxacin Palpitations    Severe Headache   Clindamycin/Lincomycin Nausea Only    Made her head hurt   Elemental Sulfur Itching   Hctz [Hydrochlorothiazide] Other (See Comments)    Abdominal pain   Doxycycline Hyclate Palpitations    Labs:  Results for orders placed or performed during  the hospital encounter of 09/30/20 (from the past 48 hour(s))  CBC with Differential     Status: Abnormal   Collection Time: 09/30/20 10:33 AM  Result Value Ref Range   WBC 6.0 4.0 - 10.5 K/uL   RBC 5.14 (H) 3.87 - 5.11 MIL/uL   Hemoglobin 14.8 12.0 - 15.0 g/dL   HCT 44.0 36.0 - 46.0 %   MCV 85.6 80.0 - 100.0 fL   MCH 28.8 26.0 - 34.0 pg   MCHC 33.6 30.0 - 36.0 g/dL   RDW 13.6 11.5 - 15.5 %   Platelets 216 150 - 400 K/uL   nRBC 0.0 0.0 - 0.2 %   Neutrophils Relative % 74 %   Neutro Abs 4.5 1.7 -  7.7 K/uL   Lymphocytes Relative 18 %   Lymphs Abs 1.0 0.7 - 4.0 K/uL   Monocytes Relative 7 %   Monocytes Absolute 0.4 0.1 - 1.0 K/uL   Eosinophils Relative 1 %   Eosinophils Absolute 0.0 0.0 - 0.5 K/uL   Basophils Relative 0 %   Basophils Absolute 0.0 0.0 - 0.1 K/uL   Immature Granulocytes 0 %   Abs Immature Granulocytes 0.02 0.00 - 0.07 K/uL    Comment: Performed at [facility], [address] [place], [place] [address]  Comprehensive metabolic panel     Status: Abnormal   Collection Time: 09/30/20 10:33 AM  Result Value Ref Range   Sodium 138 135 - 145 mmol/L   Potassium 3.2 (L) 3.5 - 5.1 mmol/L   Chloride 95 (L) 98 - 111 mmol/L   CO2 29 22 - 32 mmol/L   Glucose, Bld 343 (H) 70 - 99 mg/dL    Comment: Glucose reference range applies only to samples taken after fasting for at least 8 hours.   BUN 13 8 - 23 mg/dL   Creatinine, Ser 0.63 0.44 - 1.00 mg/dL   Calcium 10.0 8.9 - 10.3 mg/dL   Total Protein 7.1 6.5 - 8.1 g/dL   Albumin 4.0 3.5 - 5.0 g/dL   AST 34 15 - 41 U/L   ALT 28 0 - 44 U/L   Alkaline Phosphatase 100 38 - 126 U/L   Total Bilirubin 1.9 (H) 0.3 - 1.2 mg/dL   GFR, Estimated >60 >60 mL/min    Comment: (NOTE) Calculated using the CKD-EPI Creatinine Equation (2021)    Anion gap 14 5 - 15    Comment: Performed at [facility], [address] [place], [place] [address]  Salicylate level     Status: Abnormal   Collection Time: 09/30/20 10:33 AM   Result Value Ref Range   Salicylate Lvl <1.7 (L) 7.0 - 30.0 mg/dL    Comment: Performed at [facility], [address] [place], [place] [address]  Acetaminophen level     Status: Abnormal   Collection Time: 09/30/20 10:33 AM  Result Value Ref Range   Acetaminophen (Tylenol), Serum <10 (L) 10 - 30 ug/mL    Comment: (NOTE) Therapeutic concentrations vary significantly. A range of 10-30 ug/mL  may be an effective concentration for many patients. However, some  are best treated at concentrations outside of this range. Acetaminophen concentrations >150 ug/mL at 4 hours after ingestion  and >50 ug/mL at 12 hours after ingestion are often associated with  toxic reactions.  Performed at [facility], [address] [place], [place] [address]   Ethanol     Status: None   Collection Time: 09/30/20 10:33 AM  Result Value Ref Range   Alcohol, Ethyl (B) <10 <10 mg/dL    Comment: (NOTE) Lowest detectable limit for serum alcohol is 10 mg/dL.  For medical purposes only. Performed at [facility], [place], [place] [address]   Troponin I (High Sensitivity)     Status: None   Collection Time: 09/30/20 10:33 AM  Result Value Ref Range   Troponin I (High Sensitivity) 10 <18 ng/L    Comment: (NOTE) Elevated high sensitivity troponin I (hsTnI) values and significant  changes across serial measurements may suggest ACS but many other  chronic and acute conditions are known to elevate hsTnI results.  Refer to the "Links" section for chest pain algorithms and additional  guidance. Performed at [facility], [address] [place], [place] [address]   Magnesium     Status: None   Collection Time: 09/30/20 10:33 AM  Result Value Ref Range   Magnesium 1.9 1.7 - 2.4 mg/dL    Comment: Performed at [facility], [address] [place], [place] [address]  POC CBG, ED     Status: Abnormal   Collection Time: 09/30/20 10:37 AM   Result Value Ref Range   Glucose-Capillary 327 (H) 70 - 99 mg/dL    Comment: Glucose reference range applies only to samples taken after fasting for at least 8 hours.  Resp Panel by RT-PCR (Flu A&B, Covid) Nasopharyngeal Swab     Status: None   Collection Time: 09/30/20 10:38 AM   Specimen: Nasopharyngeal Swab; Nasopharyngeal(NP) swabs in vial transport medium  Result Value Ref Range   SARS Coronavirus 2 by RT PCR NEGATIVE NEGATIVE    Comment: (NOTE) SARS-CoV-2 target nucleic acids are NOT DETECTED.  The SARS-CoV-2 RNA is generally detectable in upper respiratory specimens during the acute phase of infection. The lowest concentration of SARS-CoV-2 viral copies this assay can detect is 138 copies/mL. A negative result does not preclude SARS-Cov-2 infection and should not be used as the sole basis for treatment or other patient management decisions. A negative result may occur with  improper specimen collection/handling, submission of specimen other than nasopharyngeal swab, presence of viral mutation(s) within the areas targeted by this assay, and inadequate number of viral copies(<138 copies/mL). A negative result must be combined with clinical observations, patient history, and epidemiological information. The expected result is Negative.  Fact Sheet for Patients:  EntrepreneurPulse.com.au  Fact Sheet for Healthcare Providers:  IncredibleEmployment.be  This test is no t yet approved or cleared by the [place] FDA and  has been authorized for detection and/or diagnosis of SARS-CoV-2 by FDA under an Emergency Use Authorization (EUA). This EUA will remain  in effect (meaning this test can be used) for the duration of the COVID-19 declaration under Section 564(b)(1) of the Act, 21 U.S.C.section 360bbb-3(b)(1), unless the authorization is terminated  or revoked sooner.       Influenza A by PCR NEGATIVE NEGATIVE   Influenza B by PCR  NEGATIVE NEGATIVE    Comment: (NOTE) The Xpert Xpress SARS-CoV-2/FLU/RSV plus assay is intended as an aid in the diagnosis of influenza from Nasopharyngeal swab specimens and should not be used as a sole basis for treatment. Nasal washings and aspirates are unacceptable for Xpert Xpress SARS-CoV-2/FLU/RSV testing.  Fact Sheet for Patients: EntrepreneurPulse.com.au  Fact Sheet for Healthcare Providers: IncredibleEmployment.be  This test is not yet approved or cleared by the [place] FDA and has been authorized for detection and/or diagnosis of SARS-CoV-2 by FDA under an Emergency Use Authorization (EUA). This EUA will remain in effect (meaning this test can be used) for the duration of the COVID-19 declaration under Section 564(b)(1) of the Act, 21 U.S.C. section 360bbb-3(b)(1), unless the authorization is terminated or revoked.  Performed at [facility], [address] [place], [place] [address]   CBG monitoring, ED     Status: Abnormal   Collection Time: 09/30/20  5:41 PM  Result Value Ref Range   Glucose-Capillary 274 (H) 70 - 99 mg/dL    Comment: Glucose reference range applies only to samples taken after fasting for at least 8 hours.  Urine Drug Screen, Qualitative ([place] only)     Status: None   Collection Time: 09/30/20  7:56 PM  Result Value Ref  Range   Tricyclic, Ur Screen NONE DETECTED NONE DETECTED   Amphetamines, Ur Screen NONE DETECTED NONE DETECTED   MDMA (Ecstasy)Ur Screen NONE DETECTED NONE DETECTED   Cocaine Metabolite,Ur [place] NONE DETECTED NONE DETECTED   Opiate, Ur Screen NONE DETECTED NONE DETECTED   Phencyclidine (PCP) Ur S NONE DETECTED NONE DETECTED   Cannabinoid 50 Ng, Ur [place] NONE DETECTED NONE DETECTED   Barbiturates, Ur Screen NONE DETECTED NONE DETECTED   Benzodiazepine, Ur Scrn NONE DETECTED NONE DETECTED   Methadone Scn, Ur NONE DETECTED NONE DETECTED    Comment: (NOTE) Tricyclics + metabolites,  urine    Cutoff 1000 ng/mL Amphetamines + metabolites, urine  Cutoff 1000 ng/mL MDMA (Ecstasy), urine              Cutoff 500 ng/mL Cocaine Metabolite, urine          Cutoff 300 ng/mL Opiate + metabolites, urine        Cutoff 300 ng/mL Phencyclidine (PCP), urine         Cutoff 25 ng/mL Cannabinoid, urine                 Cutoff 50 ng/mL Barbiturates + metabolites, urine  Cutoff 200 ng/mL Benzodiazepine, urine              Cutoff 200 ng/mL Methadone, urine                   Cutoff 300 ng/mL  The urine drug screen provides only a preliminary, unconfirmed analytical test result and should not be used for non-medical purposes. Clinical consideration and professional judgment should be applied to any positive drug screen result due to possible interfering substances. A more specific alternate chemical method must be used in order to obtain a confirmed analytical result. Gas chromatography / mass spectrometry (GC/MS) is the preferred confirm atory method. Performed at [facility], [address] [place], [place] [address]   Urinalysis, Complete w Microscopic Urine, Clean Catch     Status: Abnormal   Collection Time: 09/30/20  7:56 PM  Result Value Ref Range   Color, Urine YELLOW (A) YELLOW   APPearance HAZY (A) CLEAR   Specific Gravity, Urine 1.027 1.005 - 1.030   pH 5.0 5.0 - 8.0   Glucose, UA >=500 (A) NEGATIVE mg/dL   Hgb urine dipstick NEGATIVE NEGATIVE   Bilirubin Urine NEGATIVE NEGATIVE   Ketones, ur 80 (A) NEGATIVE mg/dL   Protein, ur NEGATIVE NEGATIVE mg/dL   Nitrite NEGATIVE NEGATIVE   Leukocytes,Ua NEGATIVE NEGATIVE   RBC / HPF 0-5 0 - 5 RBC/hpf   WBC, UA 0-5 0 - 5 WBC/hpf   Bacteria, UA MANY (A) NONE SEEN   Squamous Epithelial / LPF 0-5 0 - 5   Mucus PRESENT     Comment: Performed at [facility], [address] [place], [place] [address]  CBG monitoring, ED     Status: Abnormal   Collection Time: 09/30/20 10:03 PM  Result Value Ref Range    Glucose-Capillary 169 (H) 70 - 99 mg/dL    Comment: Glucose reference range applies only to samples taken after fasting for at least 8 hours.  Basic metabolic panel     Status: Abnormal   Collection Time: 10/01/20  6:59 AM  Result Value Ref Range   Sodium 140 135 - 145 mmol/L   Potassium 3.1 (L) 3.5 - 5.1 mmol/L   Chloride 102 98 - 111 mmol/L   CO2 27 22 - 32 mmol/L  Glucose, Bld 178 (H) 70 - 99 mg/dL    Comment: Glucose reference range applies only to samples taken after fasting for at least 8 hours.   BUN 10 8 - 23 mg/dL   Creatinine, Ser 0.61 0.44 - 1.00 mg/dL   Calcium 9.2 8.9 - 10.3 mg/dL   GFR, Estimated >60 >60 mL/min    Comment: (NOTE) Calculated using the CKD-EPI Creatinine Equation (2021)    Anion gap 11 5 - 15    Comment: Performed at [facility], [address] [place], [place] [address]  CBG monitoring, ED     Status: Abnormal   Collection Time: 10/01/20  7:41 AM  Result Value Ref Range   Glucose-Capillary 174 (H) 70 - 99 mg/dL    Comment: Glucose reference range applies only to samples taken after fasting for at least 8 hours.    Current Facility-Administered Medications  Medication Dose Route Frequency Provider Last Rate Last Admin   acetaminophen (TYLENOL) suppository 650 mg  650 mg Rectal Q6H PRN Ivor Costa, MD       acetaminophen (TYLENOL) tablet 650 mg  650 mg Oral Q6H PRN Ivor Costa, MD       apixaban Arne Cleveland) tablet 5 mg  5 mg Oral BID Ivor Costa, MD   5 mg at 10/01/20 0957   divalproex (DEPAKOTE ER) 24 hr tablet 250 mg  250 mg Oral Daily Derek Jack, MD       hydrALAZINE (APRESOLINE) injection 5 mg  5 mg Intravenous Q2H PRN Ivor Costa, MD       insulin aspart (novoLOG) injection 0-15 Units  0-15 Units Subcutaneous TID WC Ivor Costa, MD   3 Units at 10/01/20 0839   insulin aspart (novoLOG) injection 0-5 Units  0-5 Units Subcutaneous QHS Ivor Costa, MD       ondansetron [facility]) injection 4 mg  4 mg Intravenous Q8H PRN Ivor Costa, MD        ramipril (ALTACE) capsule 5 mg  5 mg Oral Daily Ivor Costa, MD   5 mg at 10/01/20 2778   Current Outpatient Medications  Medication Sig Dispense Refill   acetaminophen (TYLENOL) 500 MG tablet Take 500 mg every 6 (six) hours as needed by mouth. As needed     apixaban (ELIQUIS) 5 MG TABS tablet Take 1 tablet (5 mg total) by mouth 2 (two) times daily. 60 tablet 6   LORazepam (ATIVAN) 0.5 MG tablet Take 1 tablet (0.5 mg total) by mouth at bedtime as needed. (Patient taking differently: Take 0.5 mg by mouth at bedtime as needed for anxiety, sedation or sleep.) 30 tablet 0   ramipril (ALTACE) 5 MG capsule Take 1 capsule (5 mg total) by mouth daily. 30 capsule 6   Semaglutide, 1 MG/DOSE, 4 MG/3ML SOPN Inject 0.25 mg into the skin once a week for 28 days, THEN 0.5 mg once a week for 28 days, THEN 1 mg once a week for 28 days. 5.25 mL 0   Semaglutide, 1 MG/DOSE, 4 MG/3ML SOPN Inject 1 mg into the skin once a week. 3 mL 1    Musculoskeletal: Strength & Muscle Tone: decreased Gait & Station:  did not assess Patient leans: N/A  Psychiatric Specialty Exam: Physical Exam Vitals and nursing note reviewed.  Constitutional:      Appearance: Normal appearance.  HENT:     Head: Normocephalic.     Nose: Nose normal.  Pulmonary:     Effort: Pulmonary effort is normal.  Musculoskeletal:  General: Normal range of motion.     Cervical back: Normal range of motion.  Neurological:     General: No focal deficit present.     Mental Status: She is alert and oriented to person, place, and time.  Psychiatric:        Attention and Perception: Attention and perception normal.        Mood and Affect: Mood is anxious and depressed.        Speech: Speech normal.        Behavior: Behavior normal. Behavior is cooperative.        Thought Content: Thought content normal.        Cognition and Memory: Cognition normal.        Judgment: Judgment normal.    Review of Systems  Psychiatric/Behavioral:  Positive  for depression. The patient is nervous/anxious.   All other systems reviewed and are negative.  Blood pressure (!) 122/52, pulse 68, temperature 99.1 F (37.3 C), resp. rate 18, height 5\' 6"  (1.676 m), weight 74.5 kg, SpO2 95 %.Body mass index is 26.5 kg/m.  General Appearance: Casual  Eye Contact:  Good  Speech:  Normal Rate  Volume:  Normal  Mood:  Anxious and Depressed  Affect:  Blunt  Thought Process:  Coherent and Descriptions of Associations: Intact  Orientation:  Full (Time, Place, and Person)  Thought Content:  WDL and Logical  Suicidal Thoughts:  No  Homicidal Thoughts:  No  Memory:  Immediate;   Fair Recent;   Fair Remote;   Fair  Judgement:  Fair  Insight:  Fair  Psychomotor Activity:  Decreased  Concentration:  Concentration: Fair and Attention Span: Fair  Recall:  [employer] of Knowledge:  Fair  Language:  Good  Akathisia:  No  Handed:  Right  AIMS (if indicated):     Assets:  Housing Leisure Time Resilience Social Support  ADL's:  Impaired  Cognition:  WNL  Sleep:        Physical Exam: Physical Exam Vitals and nursing note reviewed.  Constitutional:      Appearance: Normal appearance.  HENT:     Head: Normocephalic.     Nose: Nose normal.  Pulmonary:     Effort: Pulmonary effort is normal.  Musculoskeletal:        General: Normal range of motion.     Cervical back: Normal range of motion.  Neurological:     General: No focal deficit present.     Mental Status: She is alert and oriented to person, place, and time.  Psychiatric:        Attention and Perception: Attention and perception normal.        Mood and Affect: Mood is anxious and depressed.        Speech: Speech normal.        Behavior: Behavior normal. Behavior is cooperative.        Thought Content: Thought content normal.        Cognition and Memory: Cognition normal.        Judgment: Judgment normal.   Review of Systems  Psychiatric/Behavioral:  Positive for depression. The  patient is nervous/anxious.   All other systems reviewed and are negative. Blood pressure (!) 122/52, pulse 68, temperature 99.1 F (37.3 C), resp. rate 18, height 5\' 6"  (1.676 m), weight 74.5 kg, SpO2 95 %. Body mass index is 26.5 kg/m.  Treatment Plan Summary: General anxiety disorder and depression: -Started Lexapro 5 mg daily -Started hydroxyzine 10 mg  TID PRN  Disposition: No evidence of imminent risk to self or others at present.   Patient does not meet criteria for psychiatric inpatient admission. Supportive therapy provided about ongoing stressors.  Waylan Boga, NP 10/01/2020 12:29 PM

## 2020-10-02 ENCOUNTER — Telehealth: Payer: Self-pay | Admitting: Family Medicine

## 2020-10-02 DIAGNOSIS — G9341 Metabolic encephalopathy: Secondary | ICD-10-CM | POA: Diagnosis not present

## 2020-10-02 DIAGNOSIS — I10 Essential (primary) hypertension: Secondary | ICD-10-CM | POA: Diagnosis not present

## 2020-10-02 LAB — CBC WITH DIFFERENTIAL/PLATELET
Abs Immature Granulocytes: 0.03 10*3/uL (ref 0.00–0.07)
Basophils Absolute: 0 10*3/uL (ref 0.0–0.1)
Basophils Relative: 1 %
Eosinophils Absolute: 0.1 10*3/uL (ref 0.0–0.5)
Eosinophils Relative: 3 %
HCT: 37 % (ref 36.0–46.0)
Hemoglobin: 12.7 g/dL (ref 12.0–15.0)
Immature Granulocytes: 1 %
Lymphocytes Relative: 36 %
Lymphs Abs: 1.6 10*3/uL (ref 0.7–4.0)
MCH: 28.9 pg (ref 26.0–34.0)
MCHC: 34.3 g/dL (ref 30.0–36.0)
MCV: 84.3 fL (ref 80.0–100.0)
Monocytes Absolute: 0.5 10*3/uL (ref 0.1–1.0)
Monocytes Relative: 11 %
Neutro Abs: 2.2 10*3/uL (ref 1.7–7.7)
Neutrophils Relative %: 48 %
Platelets: 195 10*3/uL (ref 150–400)
RBC: 4.39 MIL/uL (ref 3.87–5.11)
RDW: 13.6 % (ref 11.5–15.5)
WBC: 4.4 10*3/uL (ref 4.0–10.5)
nRBC: 0 % (ref 0.0–0.2)

## 2020-10-02 LAB — COMPREHENSIVE METABOLIC PANEL
ALT: 25 U/L (ref 0–44)
AST: 25 U/L (ref 15–41)
Albumin: 3.3 g/dL — ABNORMAL LOW (ref 3.5–5.0)
Alkaline Phosphatase: 76 U/L (ref 38–126)
Anion gap: 9 (ref 5–15)
BUN: 10 mg/dL (ref 8–23)
CO2: 29 mmol/L (ref 22–32)
Calcium: 9.2 mg/dL (ref 8.9–10.3)
Chloride: 102 mmol/L (ref 98–111)
Creatinine, Ser: 0.5 mg/dL (ref 0.44–1.00)
GFR, Estimated: >60 mL/min (ref >60)
Glucose, Bld: 123 mg/dL — ABNORMAL HIGH (ref 70–99)
Potassium: 3.2 mmol/L — ABNORMAL LOW (ref 3.5–5.1)
Sodium: 140 mmol/L (ref 135–145)
Total Bilirubin: 1.6 mg/dL — ABNORMAL HIGH (ref 0.3–1.2)
Total Protein: 5.7 g/dL — ABNORMAL LOW (ref 6.5–8.1)

## 2020-10-02 LAB — GLUCOSE, CAPILLARY
Glucose-Capillary: 154 mg/dL — ABNORMAL HIGH (ref 70–99)
Glucose-Capillary: 183 mg/dL — ABNORMAL HIGH (ref 70–99)
Glucose-Capillary: 135 mg/dL — ABNORMAL HIGH (ref 70–99)
Glucose-Capillary: 120 mg/dL — ABNORMAL HIGH (ref 70–99)

## 2020-10-02 LAB — URINE CULTURE: Culture: >=100000 — AB

## 2020-10-02 LAB — HIV ANTIBODY (ROUTINE TESTING W REFLEX): HIV Screen 4th Generation wRfx: NONREACTIVE

## 2020-10-02 LAB — PHOSPHORUS: Phosphorus: 2.7 mg/dL (ref 2.5–4.6)

## 2020-10-02 LAB — MAGNESIUM: Magnesium: 1.7 mg/dL (ref 1.7–2.4)

## 2020-10-02 MED ORDER — ADULT MULTIVITAMIN W/MINERALS CH
1.0000 | ORAL_TABLET | Freq: Every day | ORAL | Status: DC
  Administered 2020-10-03 – 2020-11-20 (×49): 1 via ORAL
  Filled 2020-10-02 (×49): qty 1

## 2020-10-02 MED ORDER — VITAMIN B-12 1000 MCG PO TABS
1000.0000 ug | ORAL_TABLET | Freq: Every day | ORAL | Status: DC
  Administered 2020-10-02 – 2020-11-20 (×50): 1000 ug via ORAL
  Filled 2020-10-02 (×51): qty 1

## 2020-10-02 MED ORDER — GLUCERNA SHAKE PO LIQD
237.0000 mL | Freq: Three times a day (TID) | ORAL | Status: DC
  Administered 2020-10-02 – 2020-11-20 (×128): 237 mL via ORAL

## 2020-10-02 MED ORDER — THIAMINE HCL 100 MG/ML IJ SOLN
100.0000 mg | Freq: Every day | INTRAMUSCULAR | Status: DC
  Administered 2020-10-03: 09:00:00 100 mg via INTRAVENOUS
  Filled 2020-10-02 (×3): qty 2

## 2020-10-02 MED ORDER — THIAMINE HCL 100 MG/ML IJ SOLN
500.0000 mg | Freq: Once | INTRAVENOUS | Status: AC
  Administered 2020-10-02: 22:00:00 500 mg via INTRAVENOUS
  Filled 2020-10-02: qty 5

## 2020-10-02 MED ORDER — POTASSIUM CHLORIDE CRYS ER 20 MEQ PO TBCR
40.0000 meq | EXTENDED_RELEASE_TABLET | Freq: Once | ORAL | Status: AC
  Administered 2020-10-02: 09:00:00 40 meq via ORAL
  Filled 2020-10-02: qty 2

## 2020-10-02 NOTE — Progress Notes (Signed)
PT Cancellation Note  Patient Details Name: Daisy Watkins MRN: [id number] DOB: March 04, 1933   Cancelled Treatment:    Reason Eval/Treat Not Completed: Other (comment). Evaluation attempted and chart reviewed. Heavily encouraged pt to work with this therapist, however adamant that she will not perform OOB this date. Per RN, pt has been ambulatory in room without assistive device. Pt reports she lives with her "son's wife". Will re-attempt next date.   Marzelle Rutten 10/02/2020, 9:21 AM Greggory Stallion, PT, DPT [phone]

## 2020-10-02 NOTE — NC FL2 (Signed)
[place] LEVEL OF CARE SCREENING TOOL     IDENTIFICATION  Patient Name: Daisy Watkins Birthdate: [birth year] Sex: female Admission Date (Current Location): 09/30/2020  [facility] and [place] Number:  Engineering geologist and Address:  [facility], [address] [place],  [address]      Provider Number: [id number]  Attending Physician Name and Address:  Elodia Florence., *  Relative Name and Phone Number:  [name] (daughter) [phone]    Current Level of Care: Hospital Recommended Level of Care: [place] Prior Approval Number:    Date Approved/Denied:   PASRR Number:    Discharge Plan: Other (Comment) ([place])    Current Diagnoses: Patient Active Problem List   Diagnosis Date Noted   Acute metabolic encephalopathy 35/32/9924   Hypokalemia 09/30/2020   Malnutrition of moderate degree ([place]) 09/24/2020   Meningioma ([place]) 09/15/2020   Confusion 09/15/2020   Psychosis in elderly with behavioral disturbance ([place]) 09/14/2020   AMS (altered mental status) 09/14/2020   Shoulder impingement 05/01/2017   Insomnia 11/12/2016   DNR (do not resuscitate) 09/30/2016   Advance directive discussed with patient 09/30/2016   Allergic rhinitis 11/23/2015   DVT, lower extremity, recurrent ([place]) 12/26/2014   Anxiety, generalized 11/23/2014   Aortic atherosclerosis ([place]) 09/28/2014   Coronary artery disease 09/28/2014   Fatty liver 09/28/2014   Paroxysmal A-fib ([place]) 09/28/2014   Benign essential hypertension 09/26/2014   GERD (gastroesophageal reflux disease) 09/26/2014   History of DVT (deep vein thrombosis) 09/26/2014   Type 2 diabetes mellitus with hyperglycemia ([place]) 09/21/2014   Hyperlipidemia, mixed 09/21/2014   Postzoster neuralgia 09/21/2014    Orientation RESPIRATION BLADDER Height & Weight     Self, Place  Normal Continent Weight: 74.5 kg Height:  5\' 6"  (167.6 cm)  BEHAVIORAL  SYMPTOMS/MOOD NEUROLOGICAL BOWEL NUTRITION STATUS      Continent Diet (Heart Healthy/ Carb modified)  AMBULATORY STATUS COMMUNICATION OF NEEDS Skin   Independent Verbally Normal                       Personal Care Assistance Level of Assistance  Bathing, Feeding, Dressing Bathing Assistance: Limited assistance Feeding assistance: Limited assistance Dressing Assistance: Limited assistance     Functional Limitations Info  Sight, Hearing, Speech Sight Info: Adequate Hearing Info: Adequate Speech Info: Adequate    SPECIAL CARE FACTORS FREQUENCY                       Contractures Contractures Info: Not present    Additional Factors Info  Code Status, Allergies Code Status Info: Full Allergies Info: Ciprofloxacin, Clindamycin, elemental sulfur, HCTZ, doxycycline           Current Medications (10/02/2020):  This is the current hospital active medication list Current Facility-Administered Medications  Medication Dose Route Frequency Provider Last Rate Last Admin   acetaminophen (TYLENOL) suppository 650 mg  650 mg Rectal Q6H PRN Ivor Costa, MD       acetaminophen (TYLENOL) tablet 650 mg  650 mg Oral Q6H PRN Ivor Costa, MD       apixaban Arne Cleveland) tablet 5 mg  5 mg Oral BID Ivor Costa, MD   5 mg at 10/02/20 2683   divalproex (DEPAKOTE ER) 24 hr tablet 250 mg  250 mg Oral Daily Derek Jack, MD   250 mg at 10/02/20 0928   escitalopram (LEXAPRO) tablet 5 mg  5 mg Oral Daily Lord,  Asa Saunas, NP   5 mg at 10/02/20 5277   hydrALAZINE (APRESOLINE) injection 5 mg  5 mg Intravenous Q2H PRN Ivor Costa, MD       hydrOXYzine (ATARAX/VISTARIL) tablet 10 mg  10 mg Oral TID PRN Patrecia Pour, NP       insulin aspart (novoLOG) injection 0-15 Units  0-15 Units Subcutaneous TID WC Ivor Costa, MD   3 Units at 10/02/20 1332   insulin aspart (novoLOG) injection 0-5 Units  0-5 Units Subcutaneous QHS Ivor Costa, MD   2 Units at 10/01/20 2141   ondansetron (ZOFRAN) injection 4 mg  4 mg  Intravenous Q8H PRN Ivor Costa, MD       ramipril (ALTACE) capsule 5 mg  5 mg Oral Daily Ivor Costa, MD   5 mg at 10/02/20 8242   vitamin B-12 (CYANOCOBALAMIN) tablet 1,000 mcg  1,000 mcg Oral Daily Elodia Florence., MD   1,000 mcg at 10/02/20 3536     Discharge Medications: Please see discharge summary for a list of discharge medications.  Relevant Imaging Results:  Relevant Lab Results:   Additional Information    Shelbie Hutching, RN

## 2020-10-02 NOTE — Telephone Encounter (Signed)
Patient is in the hospital currently.  Verbal orders for when she gets out OK  Check sugar 1x a day if able, otherwise couple of times a week.

## 2020-10-02 NOTE — Plan of Care (Signed)
End of Shift Summary:  Alert and oriented to person, place, time. VSS on room air. Ambulated in hallway. Difficulty sleeping overnight. Remained free from falls or injury. Call bell within reach and able to use.   Problem: Health Behavior/Discharge Planning: Goal: Ability to manage health-related needs will improve Outcome: Progressing   Problem: Clinical Measurements: Goal: Ability to maintain clinical measurements within normal limits will improve Outcome: Progressing Goal: Will remain free from infection Outcome: Progressing Goal: Diagnostic test results will improve Outcome: Progressing   Problem: Safety: Goal: Ability to remain free from injury will improve Outcome: Progressing   Problem: Coping: Goal: Level of anxiety will decrease Outcome: Progressing   Problem: Activity: Goal: Risk for activity intolerance will decrease Outcome: Progressing

## 2020-10-02 NOTE — Progress Notes (Signed)
Eeg done 

## 2020-10-02 NOTE — Progress Notes (Signed)
Initial Nutrition Assessment  DOCUMENTATION CODES:  Not applicable  INTERVENTION:  Liberalize diet to carb consistent to allow for more food options.  Request dining services assist pt with ordering meals Glucerna Shake po TID, each supplement provides 220 kcal and 10 grams of protein Multivitamin with minerals daily  NUTRITION DIAGNOSIS:  Inadequate oral intake related to poor appetite as evidenced by per patient/family report, meal completion < 50%.  GOAL:  Patient will meet greater than or equal to 90% of their needs  MONITOR:  PO intake, Supplement acceptance, Weight trends  REASON FOR ASSESSMENT:  Malnutrition Screening Tool    ASSESSMENT:  87 y.o. female brought to ED from home with AMS. Family reports that pt was agitated earlier in the day and then became unresponsive. PMH relevant for DM, GERD, HTN, HLD, Vitamin B12 and D deficiencies, and atrial fibrillation.  Pt recently admitted for AMS and imaging obtained during that admission showed L anterior temporal meningioma with associated vasogenic edema. No significant change in size from last admission when re-imaged. Neurology and Psychiatry consulting.   Pt resting in bedside chair at the time of assessment. Lunch tray in front of pt, untouched. Inquired as to reason for not consuming and pt reports she doesn't like her food. When asked what pt liked she replied "what I have at home" would not say what she eats at home.   Inquired about weight loss seen in the last 7 months (13%, severe). Pt had no comment on weight loss. Asked if it was intentional or if she had noticed and pt replied "it's nothing" Hard to extract information from pt, unsure if elusive answers were intentional or not.   Pt is agreeable to nutrition supplements. Will add TID to encourage intake.   Average Meal Intake: 7/3-7/5: 0% intake x 1 recorded meal  Nutritionally Relevant Medications: Scheduled Meds:  insulin aspart  0-15 Units Subcutaneous TID  WC   insulin aspart  0-5 Units Subcutaneous QHS   vitamin B-12  1,000 mcg Oral Daily   PRN Meds: ondansetron   Labs Reviewed: K 3.2 SBG ranges from 123-204 mg/dL over the last 24 hours HgbA1c 10.2% (6/17)  NUTRITION - FOCUSED PHYSICAL EXAM: Flowsheet Row Most Recent Value  Orbital Region No depletion  Upper Arm Region No depletion  Thoracic and Lumbar Region No depletion  Buccal Region No depletion  Temple Region No depletion  Clavicle Bone Region No depletion  Clavicle and Acromion Bone Region No depletion  Scapular Bone Region No depletion  Dorsal Hand No depletion  Patellar Region No depletion  Anterior Thigh Region No depletion  Posterior Calf Region No depletion  Edema (RD Assessment) Mild  Hair Reviewed  Eyes Reviewed  Mouth Reviewed  Skin Reviewed  Nails Reviewed   Diet Order:   Diet Order             Diet Carb Modified Fluid consistency: Thin; Room service appropriate? Yes with Assist  Diet effective now                   EDUCATION NEEDS:  No education needs have been identified at this time  Skin:  Skin Assessment: Reviewed RN Assessment  Last BM:  7/4  Height:  Ht Readings from Last 1 Encounters:  09/30/20 5\' 6"  (1.676 m)    Weight:  Wt Readings from Last 1 Encounters:  09/30/20 74.5 kg    Ideal Body Weight:  59.1 kg  BMI:  Body mass index is 26.5 kg/m.  Estimated  Nutritional Needs:  Kcal:  1600-1800 kcal/d Protein:  80-90 g/d Fluid:  1.8-2L/d   Ranell Patrick, RD, LDN Clinical Dietitian Pager on [place]

## 2020-10-02 NOTE — Progress Notes (Signed)
Neurology Progress Note  Patient ID: Daisy Watkins is a 87 y.o. with PMHx significant for hypertension, hyperlipidemia, diabetes, anxiety, recent diagnosis of left anterior temporal meningioma, atrial fibrillation and DVT on Eliquis, CAD, recent episodes of psychosis with fluctuating mental status who presented on 7/3 with confusion, agitation, and questionable unresponsiveness. CT head showed L anterior temporal meningioma with vasogenic edema stable compared to MRI brain 6/17. Given the location of her meningioma in the L anterior temporal lobe seizures may be contributing to her fluctuating mental status and empiric tx with depakote was started. Patient also appears to have experienced an overall subacute to chronic decline and may have a component of dementia exacerbated by psychiatric disturbance for which psychiatry consult was recommended  Initially consulted for: encephalopathy in setting of L anterior temporal meningioma  Major interval events:  Psychiatry noting that the patient did not remember discontinuation benzodiazepines (stopped 2 months ago), and recommended Lexapro 5 mg daily and hydroxyzine 10 mg 3 times daily as needed EEG completed, read pending  Subjective: -Reports no new complaints -Reports she is not sure why she is in the hospital -Reports she is hungry and would like something to eat, but refuses to eat anything from her lunch tray at bedside   Exam: Vitals:   10/02/20 0452 10/02/20 0819  BP: (!) 148/60 (!) 157/53  Pulse: 74 (!) 52  Resp: 16 18  Temp: 97.6 F (36.4 C) (!) 97.5 F (36.4 C)  SpO2: 98% 95%   Physical Exam Gen: A&O x3, does not know why she is in the hospital, does not remember hospitalization 3 wks ago. Continues to report that she uses benzodiazepines to manage her anxiety and does not recall that she was recommended to start Lexapro and hydroxyzine by psychiatry yesterday HEENT: Atraumatic, normocephalic  Resp: Breathing comfortably on room  air Abd: soft/NT/ND;    Neuro: *MS: Alert and oriented to self, "[facility], date, but not to situation *Speech: fluid, nondysarthric, able to name and repeat *CN:   I: Deferred   II,III: PERRLA, VFF by confrontation,    III,IV,VI: EOMI w/o nystagmus, no ptosis   V: Sensation intact from V1 to V3 to LT   VII: Eyelid closure was full.  Smile symmetric.   VIII: Hearing intact to voice   IX,X: Voice normal, palate elevates symmetrically   XI: SCM/trap 5/5 bilat   XII: Tongue protrudes midline, no atrophy or fasciculations   *Motor:   Normal bulk.  No tremor, rigidity or bradykinesia. No pronator drift.  She is able to sit on the side of the bed without titubation but has some mild bilateral hip flexor weakness 4/5 bilaterally.  Otherwise 5/5 throughout *Sensory: Intact to light touch, throughout *Coordination:  Finger-to-nose mildly slower with some end reach dysmetria on the right compared to the left *Reflexes:  2+ and symmetric brachioradialis *Gait: deferred    Pertinent Labs:  Lab Results  Component Value Date   HGBA1C 10.2 (H) 09/14/2020   Lab Results  Component Value Date   VITAMINB12 283 10/01/2020   Lab Results  Component Value Date   TSH 1.710 07/21/2019   Ammonia normal at 28  EEG: This study is suggestive of cortical dysfunction arising from left temporal region., nonspecific etiology but likely secondary to underlying structural abnormality/meningioma. Of note, lateralized rhythmic delta activity is on the ictal-interictal continuum with low potential for seizures. No seizures or definite epileptiform discharges were seen throughout the recording.   Impression: This is an 87 year old woman with  past medical history as above, who appears to be tolerating Depakote well.  EEG with findings on the ictal-interictal continuum, given marked memory impairment on examination continued empiric treatment of possible seizures is indicted. I will also round out  reversible causes of dementia work-up as below.  Recommendations:  #Left anterior temporal meningioma  - Continue depakote ER 250mg  nightly. Increase to 500mg  nightly after 5 days. - She was seen by [facility] last admission who did not think she was a surgical candidate but recommended consideration of o/p rad onc appt  # Subacute decline -Reversible causes of dementia labs notable for borderline low B12 at 283 -Thiamine level pending; given poor oral intake will give 500 mg once followed by 100 mg IV daily, which can be converted to 100 mg oral on discharge.  Level typically takes several days to result and can be followed up by PCP if not consulted on discharge -RPR and HIV testing pending; treat as needed  -Initiate B12 1000 mcg daily with goal B12 level greater than 400 to be followed up by PCP -Outpatient neurocognitive evaluation after patient has acclimated to Depakote -Appreciate psychiatry co-management  -Appreciate primary team, note that uncontrolled diabetes may be contributing to patient's longer term cognitive decline. She will benefit from better glucose control long-term given A1c of 10.2% last month -Increased supervision at home for medication adherence  Neurology will be available on an as needed basis going forward, please re-page if new questions arise  [place] [phone]

## 2020-10-02 NOTE — Procedures (Signed)
Patient Name: BELLAMIE TURNEY  MRN: [id number]  Epilepsy Attending: Lora Havens  Referring Physician/Provider: Dr Ivor Costa Date: 10/02/2020 Duration: 35.50 mins  Patient history: 87yo F with ams. EEG to evaluate for seizure  Level of alertness: Awake, asleep  AEDs during EEG study: VPA  Technical aspects: This EEG study was done with scalp electrodes positioned according to the 10-20 International system of electrode placement. Electrical activity was acquired at a sampling rate of 500Hz  and reviewed with a high frequency filter of 70Hz  and a low frequency filter of 1Hz . EEG data were recorded continuously and digitally stored.   Description: The posterior dominant rhythm consists of 9 Hz activity of moderate voltage (25-35 uV) seen predominantly in posterior head regions, symmetric and reactive to eye opening and eye closing. Sleep was characterized by vertex waves, sleep spindles (12 to 14 Hz), maximal frontocentral region. EEG showed intermittent rhythmic sharply contoured 2-3hz  delta slowing in left temporal region. Physiologic photic driving was not seen during photic stimulation.  Hyperventilation was not performed.     ABNORMALITY - Intermittent rhythmic delta slow, left temporal region.  IMPRESSION: This study is suggestive of cortical dysfunction arising from left temporal region., nonspecific etiology but likely secondary to underlying structural abnormality/meningioma. Of note, lateralized rhythmic delta activity is on the ictal-interictal continuum with low potential for seizures. No seizures or definite epileptiform discharges were seen throughout the recording.  Harveer Sadler Barbra Sarks

## 2020-10-02 NOTE — Telephone Encounter (Signed)
Please advise 

## 2020-10-02 NOTE — TOC Initial Note (Signed)
Transition of Care [facility]) - Initial/Assessment Note    Patient Details  Name: Daisy Watkins MRN: [id number] Date of Birth: 1933-02-10  [facility] CM/SW Contact:    Shelbie Hutching, RN Phone Number: 10/02/2020, 2:26 PM  Clinical Narrative:                 Patient admitted to the hospital with acute metabolic encephalopathy.  RNCM met with patient at the bedside but patient unable to provide any reliable history.  RNCM spoke with patient's daughter, [name], via phone.  [name] reports that the patient has been living with her daughter in law, patient was living with son and daughter in law and then when her son passed away she continued to live with the daughter in law.  Family reports that patient's behavior has become erratic and uncooperative such as: she will not bath or dress, she will not take her medications and she will not eat adequately.  Patient cannot return to her daughter in laws at discharge.   [name] would like help placing the patient, she does not need skilled nursing but cannot live alone.  Daughter has been in contact with [facility].   RNCM called and spoke with Tammy over at [facility] as Thayer Headings is on medical leave.  Tammy is requesting FL2.  FL2 will be faxed to [phone].    Expected Discharge Plan: Assisted Living Barriers to Discharge: Continued Medical Work up   Patient Goals and CMS Choice Patient states their goals for this hospitalization and ongoing recovery are:: Patient is unable to state goals but daughter would like for the patient to go to [facility] CMS Medicare.gov Compare Post Acute Care list provided to:: Patient Represenative (must comment) Choice offered to / list presented to : Adult Children  Expected Discharge Plan and Services Expected Discharge Plan: Assisted Living   Discharge Planning Services: CM Consult   Living arrangements for the past 2 months: Single Family Home                 DME Arranged: N/A DME Agency: NA        HH Arranged: NA          Prior Living Arrangements/Services Living arrangements for the past 2 months: Single Family Home Lives with:: Relatives (daughter in law) Patient language and need for interpreter reviewed:: Yes Do you feel safe going back to the place where you live?: No   patient cannot go back to her daughter in [place] at discharge  Need for Family Participation in Patient Care: Yes (Comment) (patient having behavioral concerns) Care giver support system in place?: Yes (comment) (daughter)   Criminal Activity/Legal Involvement Pertinent to Current Situation/Hospitalization: No - Comment as needed  Activities of Daily Living Home Assistive Devices/Equipment: Dentures (specify type) ADL Screening (condition at time of admission) Patient's cognitive ability adequate to safely complete daily activities?: Yes Is the patient deaf or have difficulty hearing?: No Does the patient have difficulty seeing, even when wearing glasses/contacts?: No Does the patient have difficulty concentrating, remembering, or making decisions?: Yes Patient able to express need for assistance with ADLs?: Yes Does the patient have difficulty dressing or bathing?: No Independently performs ADLs?: No Does the patient have difficulty walking or climbing stairs?: No Weakness of Legs: Both Weakness of Arms/Hands: None  Permission Sought/Granted Permission sought to share information with : Case Manager, Family Supports, Customer service manager Permission granted to share information with : Yes, Verbal Permission Granted  Share Information with  NAME: [name]  Permission granted to share info w AGENCY: [place] granted to share info w Relationship: daughter     Emotional Assessment Appearance:: Appears stated age Attitude/Demeanor/Rapport: Inconsistent Affect (typically observed): Flat Orientation: : Oriented to Self, Oriented to Place Alcohol / Substance Use: Not  Applicable Psych Involvement: No (comment)  Admission diagnosis:  Altered mental status, unspecified altered mental status type [B30.10] Acute metabolic encephalopathy [A04.59] AMS (altered mental status) [R41.82] Patient Active Problem List   Diagnosis Date Noted   Acute metabolic encephalopathy 13/68/5992   Hypokalemia 09/30/2020   Malnutrition of moderate degree ([place]) 09/24/2020   Meningioma ([place]) 09/15/2020   Confusion 09/15/2020   Psychosis in elderly with behavioral disturbance ([place]) 09/14/2020   AMS (altered mental status) 09/14/2020   Shoulder impingement 05/01/2017   Insomnia 11/12/2016   DNR (do not resuscitate) 09/30/2016   Advance directive discussed with patient 09/30/2016   Allergic rhinitis 11/23/2015   DVT, lower extremity, recurrent ([place]) 12/26/2014   Anxiety, generalized 11/23/2014   Aortic atherosclerosis ([place]) 09/28/2014   Coronary artery disease 09/28/2014   Fatty liver 09/28/2014   Paroxysmal A-fib ([place]) 09/28/2014   Benign essential hypertension 09/26/2014   GERD (gastroesophageal reflux disease) 09/26/2014   History of DVT (deep vein thrombosis) 09/26/2014   Type 2 diabetes mellitus with hyperglycemia ([place]) 09/21/2014   Hyperlipidemia, mixed 09/21/2014   Postzoster neuralgia 09/21/2014   PCP:  Valerie Roys, DO Pharmacy:   [place], [place] [address] Phone: [phone] Fax: [place], [place] [address] [place] [address] Phone: [phone] Fax: [phone]  [id number] [place] [address] [place]: [phone]: [address]: [phone]: [phone]    Social Determinants of Health  (SDOH) Interventions    Readmission Risk Interventions No flowsheet data found.

## 2020-10-02 NOTE — Telephone Encounter (Signed)
Called and left a detailed message for Tonya with verbal orders.

## 2020-10-02 NOTE — Telephone Encounter (Signed)
Copied from [place] [phone]. Topic: Quick Communication - Home Health Verbal Orders >> Oct 02, 2020 10:31 AM Jodie Echevaria wrote: Caller/Agency: [facility]  Callback Number: [phone] ok to LM Requesting OT/PT/Skilled Nursing/Social Work/Speech Therapy: Nursing  Frequency: 1 w 4, once every two weeks for six weeks, 1 PRN diabetes teaching  How often should the family check patients blood sugar? Even though no Dx with dementia but patient had a very violent outburst making threats to daughter and nurse. Please advise

## 2020-10-02 NOTE — Progress Notes (Addendum)
PROGRESS NOTE    [name]  HYI:[id number] DOB: 1932-11-09 DOA: 09/30/2020 PCP: Valerie Roys, DO   Chief Complaint  Patient presents with   Altered Mental Status   Brief Narrative:  Daisy Watkins is Daisy Watkins 87 y.o. female with medical history significant of hypertension, hyperlipidemia, diabetes mellitus, GERD, anxiety, meningioma, atrial fibrillation/DVT on Eliquis, CAD, psychosis, who presents with confusion, agitation, questionable unresponsiveness.   Per her daughter (I called her daughter by phone), since this morning patient has been more confused, agitated, screaming around.  States that the patient did not fall, just slide down from commode last night.  No significant injury.  Daughter states that patient did not pass out, but per EDP, "Patient was unresponsive when EMS got there but then started waking up and was able to follow commands but was nonverbal". When I saw pt in ED, she is confused, but knows her own name, knows that she is in hospital.  She is confused about time.  She is emotional and crying.  No hallucination.  Denies suicidal or homicidal ideations.  She moves all extremities normally.  No facial droop or slurred speech.  She has mild dry cough, denies chest pain, shortness of breath.  Patient states that sometimes that she had diarrhea recently, but no diarrhea today.  Denies nausea, vomiting or abdominal pain.  No symptoms of UTI.  Per ED physician, "there is also concern from family that she has not been taking her medication and not taking care of herself therefore IVC was placed".   Pt had MRI-brain on 09/14/20 1. No acute intracranial abnormality. 2. Left paraclinoid meningioma with vasogenic edema in the left anterior temporal lobe. There is likely invasion of the left cavernous sinus.   ED Course: pt was found to have WBC 6.0, troponin level 10, Tylenol level less than 10, salicylate level less than 7, negative COVID PCR, alcohol level less than 10, potassium 3.2,  renal function okay, temperature normal, blood pressure 148/56, heart rate 94, RR 17, oxygen saturation 98% on room air.  Chest x-ray negative.  Patient is placed on MedSurg bed for observation. Patient is IVC'ed. Message sent to psychiatrist, Gust Rung for consultation.   CT-head today 1. Stable head CT. 2. No significant change in the left paraclinoid meningioma. Stable vasogenic edema involving the left temporal lobe.     Assessment & Plan:   Principal Problem:   Acute metabolic encephalopathy Active Problems:   Type 2 diabetes mellitus with hyperglycemia (HCC)   Coronary artery disease   Benign essential hypertension   Paroxysmal Sharma Lawrance-fib (HCC)   Anxiety, generalized   History of DVT (deep vein thrombosis)   Psychosis in elderly with behavioral disturbance (HCC)   AMS (altered mental status)   Meningioma (HCC)   Hypokalemia  Acute metabolic encephalopathy: Patient has confusion, agitation, possible unresponsiveness (daughter denies passing out, but EMS reported patient was unresponsiveness).  Concern for psychosis on presentation.  Given the history of meningioma with vasogenic edema, seizure is Eyoel Throgmorton potential differential diagnosis. Also, progression of dementia (not formally diagnosed) vs related to meningioma? - MRI brain 6/17 with L paraclinoid meningioma with vasogenic edema in the left anterior temporal lobe, likely invasion of the L cavernous sinus - CT head stable from 7/3, no significant change in L paraclinoid meningioma, stable vasogenic edema involving -EEG pending -give 1000 mg of Keppra empirically -> transitioned to depakote per Neurology (appreciate assistance) -Seizure precaution -When necessary Ativan for seizure -Frequent neuro checks -f/u UA, many bacteria -  negative nitrite and WBC - follow culture.  Follow ammonia (wnl), b12 (low normal, follow MMA, replace empirically), folate (wnl).   Psychosis in elderly with behavioral disturbance -psychiatry evaluated and  not Orvella Digiulio risk to self or others - see note from 7/4 - IVC rescinded - lexapro 5 mg daily, hydroxyzine 10 mg TID prn   Disposition  Goals of Care - currently full code, will discuss additionally with family if possible.  Per discussion with daughter, Daisy Watkins, on 7/4, doesn't sound like she can go back where she was before (with daughter in law).  Not sure there's Shaneika Rossa safe place for her to discharge at this point.  Discuss further with family, TOC team.   Type 2 diabetes mellitus with hyperglycemia ([place]): Recent A1c 10.2, poorly controlled.  Patient is taking semaglutide -SSI   Coronary artery disease: No chest pain.  Patient is not taking aspirin or statin -Observe closely   Benign essential hypertension -Ramipril   Paroxysmal Jonda Alanis-fib (HCC): HR 96 -->76 -eliquis   Anxiety, generalized -prn Ativen   History of DVT (deep vein thrombosis) -Eliquis   Meningioma ([place]): Seems to be stable on CT scan -may need to follow up with neurosurgeon -Neurosurg recommended considering outpatient rad onc    Hypokalemia: replace and follow    DVT prophylaxis: eliquis  Code Status: full  Family Communication: none at bedside Disposition:   Status is: Observation  The patient will require care spanning > 2 midnights and should be moved to inpatient because: Inpatient level of care appropriate due to severity of illness  Dispo: The patient is from: Home              Anticipated d/c is to: Home              Patient currently is not medically stable to d/c.   Difficult to place patient No       Consultants:  Psych neurology  Procedures:  none  Antimicrobials:  Anti-infectives (From admission, onward)    None          Subjective: Confused again today, but seems less agitated  Objective: Vitals:   10/01/20 2103 10/02/20 0039 10/02/20 0452 10/02/20 0819  BP: (!) 143/57 (!) 148/54 (!) 148/60 (!) 157/53  Pulse: (!) 52 61 74 (!) 52  Resp: 15 16 16 18   Temp: 97.6 F (36.4 C) 97.6  F (36.4 C) 97.6 F (36.4 C) (!) 97.5 F (36.4 C)  TempSrc:    Oral  SpO2: 99% 97% 98% 95%  Weight:      Height:        Intake/Output Summary (Last 24 hours) at 10/02/2020 0913 Last data filed at 10/01/2020 1831 Gross per 24 hour  Intake 91.2 ml  Output --  Net 91.2 ml   Filed Weights   09/30/20 1031  Weight: 74.5 kg    Examination:  General: No acute distress. Cardiovascular: RRR Lungs: unlabored Abdomen: Soft, nontender, nondistended Neurological: Alert, but disoriented, less aggravated today Moves all extremities 4. Cranial nerves II through XII grossly intact. Skin: Warm and dry. No rashes or lesions. Extremities: No clubbing or cyanosis. No edema  Data Reviewed: I have personally reviewed following labs and imaging studies  CBC: Recent Labs  Lab 09/30/20 1033 10/02/20 0504  WBC 6.0 4.4  NEUTROABS 4.5 2.2  HGB 14.8 12.7  HCT 44.0 37.0  MCV 85.6 84.3  PLT 216 295    Basic Metabolic Panel: Recent Labs  Lab 09/30/20 1033 10/01/20 0659 10/02/20 0504  NA 138 140 140  K 3.2* 3.1* 3.2*  CL 95* 102 102  CO2 29 27 29   GLUCOSE 343* 178* 123*  BUN 13 10 10   CREATININE 0.63 0.61 0.50  CALCIUM 10.0 9.2 9.2  MG 1.9  --  1.7  PHOS  --   --  2.7    GFR: Estimated Creatinine Clearance: 52.1 mL/min (by C-G formula based on SCr of 0.5 mg/dL).  Liver Function Tests: Recent Labs  Lab 09/30/20 1033 10/02/20 0504  AST 34 25  ALT 28 25  ALKPHOS 100 76  BILITOT 1.9* 1.6*  PROT 7.1 5.7*  ALBUMIN 4.0 3.3*    CBG: Recent Labs  Lab 10/01/20 0741 10/01/20 1344 10/01/20 1629 10/01/20 2135 10/02/20 0814  GLUCAP 174* 190* 204* 149* 154*     Recent Results (from the past 240 hour(s))  Resp Panel by RT-PCR (Flu Tayah Idrovo&B, Covid) Nasopharyngeal Swab     Status: None   Collection Time: 09/30/20 10:38 AM   Specimen: Nasopharyngeal Swab; Nasopharyngeal(NP) swabs in vial transport medium  Result Value Ref Range Status   SARS Coronavirus 2 by RT PCR NEGATIVE  NEGATIVE Final    Comment: (NOTE) SARS-CoV-2 target nucleic acids are NOT DETECTED.  The SARS-CoV-2 RNA is generally detectable in upper respiratory specimens during the acute phase of infection. The lowest concentration of SARS-CoV-2 viral copies this assay can detect is 138 copies/mL. Jontez Redfield negative result does not preclude SARS-Cov-2 infection and should not be used as the sole basis for treatment or other patient management decisions. Jasey Cortez negative result may occur with  improper specimen collection/handling, submission of specimen other than nasopharyngeal swab, presence of viral mutation(s) within the areas targeted by this assay, and inadequate number of viral copies(<138 copies/mL). Lu Paradise negative result must be combined with clinical observations, patient history, and epidemiological information. The expected result is Negative.  Fact Sheet for Patients:  EntrepreneurPulse.com.au  Fact Sheet for Healthcare Providers:  IncredibleEmployment.be  This test is no t yet approved or cleared by the [place] FDA and  has been authorized for detection and/or diagnosis of SARS-CoV-2 by FDA under an Emergency Use Authorization (EUA). This EUA will remain  in effect (meaning this test can be used) for the duration of the COVID-19 declaration under Section 564(b)(1) of the Act, 21 U.S.C.section 360bbb-3(b)(1), unless the authorization is terminated  or revoked sooner.       Influenza Tzipporah Nagorski by PCR NEGATIVE NEGATIVE Final   Influenza B by PCR NEGATIVE NEGATIVE Final    Comment: (NOTE) The Xpert Xpress SARS-CoV-2/FLU/RSV plus assay is intended as an aid in the diagnosis of influenza from Nasopharyngeal swab specimens and should not be used as Nyriah Coote sole basis for treatment. Nasal washings and aspirates are unacceptable for Xpert Xpress SARS-CoV-2/FLU/RSV testing.  Fact Sheet for Patients: EntrepreneurPulse.com.au  Fact Sheet for Healthcare  Providers: IncredibleEmployment.be  This test is not yet approved or cleared by the [place] FDA and has been authorized for detection and/or diagnosis of SARS-CoV-2 by FDA under an Emergency Use Authorization (EUA). This EUA will remain in effect (meaning this test can be used) for the duration of the COVID-19 declaration under Section 564(b)(1) of the Act, 21 U.S.C. section 360bbb-3(b)(1), unless the authorization is terminated or revoked.  Performed at [facility], [address] [place], [place] [address]   Urine Culture     Status: None (Preliminary result)   Collection Time: 09/30/20  7:56 PM   Specimen: Urine, Random  Result Value Ref Range Status  Specimen Description   Final    URINE, RANDOM Performed at [facility], [address], [place], [place] [address]    Special Requests   Final    NONE Performed at [facility], [address] [place], [place] [address]    Culture   Final    CULTURE REINCUBATED FOR BETTER GROWTH Performed at [facility], [place] [address], [place], [place] [address]    Report Status PENDING  Incomplete         Radiology Studies: DG Chest 2 View  Result Date: 09/30/2020 CLINICAL DATA:  Shortness of breath EXAM: CHEST - 2 VIEW COMPARISON:  05/31/2005 chest radiograph FINDINGS: The cardiomediastinal silhouette is unremarkable. Mild peribronchial thickening is again noted. There is no evidence of focal airspace disease, pulmonary edema, suspicious pulmonary nodule/mass, pleural effusion, or pneumothorax. No acute bony abnormalities are identified. IMPRESSION: No evidence of acute cardiopulmonary disease. Electronically Signed   By: Margarette Canada M.D.   On: 09/30/2020 11:27   CT Head Wo Contrast  Result Date: 09/30/2020 CLINICAL DATA:  87 year old with mental status change. EXAM: CT HEAD WITHOUT CONTRAST TECHNIQUE: Contiguous axial images were obtained from the base of the skull through  the vertex without intravenous contrast. COMPARISON:  09/14/2020 and MRI 09/14/2020 FINDINGS: Brain: Again noted is Adriena Manfre large calcified lesion in the left middle cranial fossa measures up to 2.1 cm. Based on previous imaging, this is compatible with Chariah Bailey left paraclinoid meningioma. Again noted is vasogenic edema in the left temporal lobe that is similar to the prior examination. Stable mild cerebral atrophy. Mild low-density in the white matter is stable. No evidence for acute hemorrhage, midline shift, hemorrhage or large new infarct. Vascular: No hyperdense vessel or unexpected calcification. Skull: Stable appearance of the meningioma in the left middle cranial fossa with apparent involvement of the cavernous sinus. No acute bone abnormality. Sinuses/Orbits: Visualized paranasal sinuses are clear. Other: None IMPRESSION: 1. Stable head CT. 2. No significant change in the left paraclinoid meningioma. Stable vasogenic edema involving the left temporal lobe. Electronically Signed   By: Markus Daft M.D.   On: 09/30/2020 11:15        Scheduled Meds:  apixaban  5 mg Oral BID   divalproex  250 mg Oral Daily   escitalopram  5 mg Oral Daily   insulin aspart  0-15 Units Subcutaneous TID WC   insulin aspart  0-5 Units Subcutaneous QHS   potassium chloride  40 mEq Oral Once   ramipril  5 mg Oral Daily   vitamin B-12  1,000 mcg Oral Daily   Continuous Infusions:   LOS: 1 day    Time spent: over 30 min    Fayrene Helper, MD [facility]   To contact the attending provider between 7A-7P or the covering provider during after hours 7P-7A, please log into the web site [email or web address] and access using universal [place] password for that web site. If you do not have the password, please call the hospital operator.  10/02/2020, 9:13 AM

## 2020-10-03 DIAGNOSIS — I10 Essential (primary) hypertension: Secondary | ICD-10-CM | POA: Diagnosis not present

## 2020-10-03 LAB — CBC WITH DIFFERENTIAL/PLATELET
Abs Immature Granulocytes: 0.02 10*3/uL (ref 0.00–0.07)
Basophils Absolute: 0 10*3/uL (ref 0.0–0.1)
Basophils Relative: 1 %
Eosinophils Absolute: 0.1 10*3/uL (ref 0.0–0.5)
Eosinophils Relative: 3 %
HCT: 39 % (ref 36.0–46.0)
Hemoglobin: 13.3 g/dL (ref 12.0–15.0)
Immature Granulocytes: 1 %
Lymphocytes Relative: 34 %
Lymphs Abs: 1.4 10*3/uL (ref 0.7–4.0)
MCH: 29.2 pg (ref 26.0–34.0)
MCHC: 34.1 g/dL (ref 30.0–36.0)
MCV: 85.7 fL (ref 80.0–100.0)
Monocytes Absolute: 0.5 10*3/uL (ref 0.1–1.0)
Monocytes Relative: 12 %
Neutro Abs: 2.1 10*3/uL (ref 1.7–7.7)
Neutrophils Relative %: 49 %
Platelets: 202 10*3/uL (ref 150–400)
RBC: 4.55 MIL/uL (ref 3.87–5.11)
RDW: 13.8 % (ref 11.5–15.5)
WBC: 4.2 10*3/uL (ref 4.0–10.5)
nRBC: 0 % (ref 0.0–0.2)

## 2020-10-03 LAB — COMPREHENSIVE METABOLIC PANEL
ALT: 24 U/L (ref 0–44)
AST: 22 U/L (ref 15–41)
Albumin: 3.2 g/dL — ABNORMAL LOW (ref 3.5–5.0)
Alkaline Phosphatase: 86 U/L (ref 38–126)
Anion gap: 10 (ref 5–15)
BUN: 12 mg/dL (ref 8–23)
CO2: 26 mmol/L (ref 22–32)
Calcium: 9.5 mg/dL (ref 8.9–10.3)
Chloride: 103 mmol/L (ref 98–111)
Creatinine, Ser: 0.53 mg/dL (ref 0.44–1.00)
GFR, Estimated: >60 mL/min (ref >60)
Glucose, Bld: 175 mg/dL — ABNORMAL HIGH (ref 70–99)
Potassium: 3.9 mmol/L (ref 3.5–5.1)
Sodium: 139 mmol/L (ref 135–145)
Total Bilirubin: 1.5 mg/dL — ABNORMAL HIGH (ref 0.3–1.2)
Total Protein: 5.9 g/dL — ABNORMAL LOW (ref 6.5–8.1)

## 2020-10-03 LAB — MAGNESIUM: Magnesium: 1.8 mg/dL (ref 1.7–2.4)

## 2020-10-03 LAB — RPR: RPR Ser Ql: NONREACTIVE

## 2020-10-03 LAB — GLUCOSE, CAPILLARY
Glucose-Capillary: 168 mg/dL — ABNORMAL HIGH (ref 70–99)
Glucose-Capillary: 163 mg/dL — ABNORMAL HIGH (ref 70–99)
Glucose-Capillary: 138 mg/dL — ABNORMAL HIGH (ref 70–99)
Glucose-Capillary: 167 mg/dL — ABNORMAL HIGH (ref 70–99)

## 2020-10-03 LAB — PHOSPHORUS: Phosphorus: 3.3 mg/dL (ref 2.5–4.6)

## 2020-10-03 MED ORDER — ESCITALOPRAM OXALATE 5 MG PO TABS: 5.0000 mg | ORAL_TABLET | Freq: Every day | ORAL | 3 refills | Status: AC

## 2020-10-03 MED ORDER — CYANOCOBALAMIN 1000 MCG PO TABS: 1000.0000 ug | ORAL_TABLET | Freq: Every day | ORAL | Status: AC

## 2020-10-03 MED ORDER — THIAMINE HCL 100 MG/ML IJ SOLN: 100.0000 mg | Freq: Every day | INTRAMUSCULAR | 0 refills | Status: DC

## 2020-10-03 MED ORDER — ADULT MULTIVITAMIN W/MINERALS CH: 1.0000 | ORAL_TABLET | Freq: Every day | ORAL | Status: AC

## 2020-10-03 MED ORDER — GLUCERNA SHAKE PO LIQD: 237.0000 mL | Freq: Three times a day (TID) | ORAL | 0 refills | Status: AC

## 2020-10-03 MED ORDER — DIVALPROEX SODIUM ER 250 MG PO TB24: 250.0000 mg | ORAL_TABLET | Freq: Every day | ORAL | 2 refills | Status: AC

## 2020-10-03 NOTE — TOC Progression Note (Signed)
Transition of Care [facility]) - Progression Note    Patient Details  Name: FLORNCE RECORD MRN: [id number] Date of Birth: May 16, 1932  Transition of Care [facility]) CM/SW Contact  Shelbie Hutching, RN Phone Number: 10/03/2020, 2:55 PM  Clinical Narrative:    [facility] will [facility] happy to [facility] patient but they cannot accept her today.  Patient's daughter needs to go to [place] and take care of the admission paperwork and financial obligation related to patient's admission to [facility].  Tammy at [facility] reports that if the daughter completes her part today they should be able to accept the patient tomorrow to their memory care.  MD and bedside RN updated on plan.    Expected Discharge Plan: Assisted Living Barriers to Discharge: Continued Medical Work up  Expected Discharge Plan and Services Expected Discharge Plan: Assisted Living   Discharge Planning Services: CM Consult   Living arrangements for the past 2 months: Single Family Home Expected Discharge Date: 10/03/20               DME Arranged: N/A DME Agency: NA       HH Arranged: NA           Social Determinants of Health (SDOH) Interventions    Readmission Risk Interventions No flowsheet data found.

## 2020-10-03 NOTE — Evaluation (Signed)
Physical Therapy Evaluation Patient Details Name: Daisy Watkins MRN: [id number] DOB: April 29, 1932 Today's Date: 10/03/2020   History of Present Illness  Pt is an 87 y/o admitted on 09/30/20 with c/c of AMS with confusion and agitation. Pt recently d/c on 09/15/20 after being admitted for change in mental status. PMH: anxiety, DVT, DM, GERD, insomnia, HTN, HLD, insomnia, myalgia.  Clinical Impression  Pt is an 87 y/o female admitted with AMS. She was received sitting up in bed. Pleasant and agreeable to transfer to chair at start of session; once sitting EOB, pt became slightly agitated and began refusing all participation with PT, she requested to return to bed. Repeated attempts to persuade participation without success. She presented with some confusion and was an unclear historian. Bed mobility including scooting was performed with SUP. OOB activity was not observed. She has good BLE strength and good unsupported sitting balance. Out of bed mobility including transfers, ambulation and standing balance to be assessed at follow up treatment session.  Pt will benefit from skilled PT to increase independence and safety with mobility upon d/c. PT is recommending home with home health vs long-term care.      Follow Up Recommendations Home health PT;Supervision/Assistance - 24 hour (vs LTC)    Equipment Recommendations  None recommended by PT    Recommendations for Other Services       Precautions / Restrictions Precautions Precautions: None Restrictions Weight Bearing Restrictions: No      Mobility  Bed Mobility Overal bed mobility: Needs Assistance Bed Mobility: Sit to Supine;Supine to Sit     Supine to sit: Supervision Sit to supine: Supervision   General bed mobility comments: SUP for safety; VC to scoot towards [facility] for better positioning when returning to bed    Transfers                 General transfer comment: not observed due to pt refusal  Ambulation/Gait              General Gait Details: not observed due to pt refusal  Stairs            Wheelchair Mobility    Modified Rankin (Stroke Patients Only)       Balance       Sitting balance - Comments: Independent to sit EOB; standing balance not observed due to pt refusal to stand                                     Pertinent Vitals/Pain Pain Assessment: No/denies pain    Home Living Family/patient expects to be discharged to:: Private residence Living Arrangements: Other relatives (with daughter-in-law) Available Help at Discharge: Family;Available 24 hours/day Type of Home: Mobile home Home Access: Stairs to enter Entrance Stairs-Rails: Right;Left;Can reach both Entrance Stairs-Number of Steps: 6 Home Layout: One level Home Equipment: None      Prior Function Level of Independence: Independent (per pt report)               Hand Dominance        Extremity/Trunk Assessment   Upper Extremity Assessment Upper Extremity Assessment: Overall WFL for tasks assessed    Lower Extremity Assessment Lower Extremity Assessment: Overall WFL for tasks assessed (4+/5 bilateral hip flexion; 5/5 bilateral knee ext and ankle DF) RLE Sensation: WNL LLE Sensation: WNL       Communication   Communication: No difficulties  Cognition  Arousal/Alertness: Awake/alert Behavior During Therapy: Agitated Overall Cognitive Status: No family/caregiver present to determine baseline cognitive functioning Area of Impairment: Attention;Following commands;Safety/judgement;Awareness;Orientation                 Orientation Level: Disoriented to;Time Current Attention Level: Alternating           General Comments: A&O x2, unaware of year and reason for hospitalization. Mid-session pt switched from agreeable to refusing all participation without warning.      General Comments      Exercises     Assessment/Plan    PT Assessment Patient needs continued PT  services  PT Problem List Decreased mobility;Decreased safety awareness;Decreased cognition       PT Treatment Interventions DME instruction;Therapeutic activities;Gait training;Therapeutic exercise;Patient/family education;Stair training;Balance training;Functional mobility training;Neuromuscular re-education    PT Goals (Current goals can be found in the Care Plan section)  Acute Rehab PT Goals Patient Stated Goal: none stated PT Goal Formulation: With patient Time For Goal Achievement: 10/17/20 Potential to Achieve Goals: Fair    Frequency Min 2X/week   Barriers to discharge Decreased caregiver support (per RN, unsure if pt is recieving adequate assistance at home by caregivers)      Co-evaluation               AM-PAC PT "6 Clicks" Mobility  Outcome Measure Help needed turning from your back to your side while in a flat bed without using bedrails?: None Help needed moving from lying on your back to sitting on the side of a flat bed without using bedrails?: None Help needed moving to and from a bed to a chair (including a wheelchair)?: A Little Help needed standing up from a chair using your arms (e.g., wheelchair or bedside chair)?: A Little Help needed to walk in hospital room?: A Little Help needed climbing 3-5 steps with a railing? : A Lot 6 Click Score: 19    End of Session   Activity Tolerance: Other (comment) (Eval limited due to pt refusal to participate midway through session) Patient left: in bed;with bed alarm set;with call bell/phone within reach Nurse Communication: Mobility status;Other (comment) (lack of participation) PT Visit Diagnosis: Unsteadiness on feet (R26.81)    Time: 0950-1002 PT Time Calculation (min) (ACUTE ONLY): 12 min   Charges:   PT Evaluation $PT Eval Low Complexity: 1 Low        Zachry Hopfensperger PT, DPT 10/03/20 1:19 PM [phone]

## 2020-10-03 NOTE — Discharge Summary (Signed)
Physician Discharge Summary  Daisy Watkins ERX:[id number] DOB: 1932-08-05 DOA: 09/30/2020  PCP: Valerie Roys, DO  Admit date: 09/30/2020 Discharge date: 10/03/2020  Admitted From: Home  Disposition:  ALF memory care   Recommendations for Outpatient Follow-up:  Follow up with PCP Dr. Wynetta Emery in 1-2 weeks Dr. Wynetta Emery: Please refer patient for formal neuropsychiatric testing or other dementia evaluation Dr. Wynetta Emery: Please follow up vitamin B1/thiamine and methylmalonic acid testing     Home Health: PT/OT due to imbalance  Equipment/Devices: None new  Discharge Condition: Good  CODE STATUS: FULL Diet recommendation: Regular  Brief/Interim Summary: Daisy Watkins is an 87 y.o. F with HTN, diabetes, meningioma, Afib/hx DVT on ELiquis, and CAD who presented with change in mentation.  Patient lives with her son and daughter-in-law for many years, no formal diagnosis of dementia, but had been increasingly limited with IADLs in recent years per daughter.  Then several months ago, the patient's son died, and her condition has deteriorated.  Then, around the time of admission, patient's daughter encountered her agitated and confused.  She had an episode where she slid off the commode, and was confused/had decreased responsiveness, but no focal neurological deficits.  There was concern that she was "not taking her medications properly and not taking care of herself" and so the patient was IVC and brought to the ER.       PRINCIPAL HOSPITAL DIAGNOSIS: Acute metabolic encephalopathy due to poor nutrition, possibly Ativan in setting of dementia    Discharge Diagnoses:  Acute metabolic encephalopathy Patient was admitted, and neurology were consulted.  MRI neuraxial imaging unremarkable.  EEG unremarkable.  Ammonia, TSH, RPR, HIV, CK and folate were all normal.  She had no symptoms of Warnicke's, but thiamine level was sent and is still pending at this time.  Methylmalonic acid also pending  although B12 level normal.  The patient's Ativan was held, and she was observed for several days.  During this time, she appeared to have memory impairment, but no evidence of infection, electrolyte disturbance, psychiatric disease, or organic cause of cognitive impairment.  She was evaluated by PT who recommended home health physical therapy, and arrangements were made to place the patient in memory care.   Mood disorder Patient was evaluated by Psychiatry.  Ativan was stopped given concern for this contributing to patient's presentation.  She was started on Depakote and escitalopram.  Meningioma This is not suspected to be the cause of the patient's change in mentation.  It would be reasonable for the patient to follow-up with radiation oncology, given an increase in size.  Paroxysmal atrial fibrillation History of VTE Continue Eliquis  Coronary artery disease, secondary prevention  Hypertension  Diabetes      Discharge Instructions  Discharge Instructions     Discharge instructions   Complete by: As directed    From Dr. Loleta Books: Your mother was admitted for confusion Here, we did a careful and thorough work up, to look for signs of infection, abnormal electrolytes, common vitamin deficiencies, seizures, strokes, or other causes of confusion, and these were all normal.  Given her confusion has been slow to progress over months, and is now worse since the loss of her son, I suspect your mothers confusion is actually the normal symptoms of memory loss (also known as early dementia).  She should take vitamin supplements as listed below She should see her primary care as soon as possible for "neuropsychiatric testing for dementia" She should resume her other medicines  She should NOT  take lorazepam/Ativan, as this causes confusion, weakness, falls in the elderly She should replace it with Depakote and escitalopram and follow up with PCP   Increase activity slowly   Complete  by: As directed       Allergies as of 10/03/2020       Reactions   Ciprofloxacin Palpitations   Severe Headache   Clindamycin/lincomycin Nausea Only   Made her head hurt   Elemental Sulfur Itching   Hctz [hydrochlorothiazide] Other (See Comments)   Abdominal pain   Doxycycline Hyclate Palpitations        Medication List     STOP taking these medications    LORazepam 0.5 MG tablet Commonly known as: ATIVAN       TAKE these medications    acetaminophen 500 MG tablet Commonly known as: TYLENOL Take 500 mg every 6 (six) hours as needed by mouth. As needed   apixaban 5 MG Tabs tablet Commonly known as: ELIQUIS Take 1 tablet (5 mg total) by mouth 2 (two) times daily.   cyanocobalamin 1000 MCG tablet Take 1 tablet (1,000 mcg total) by mouth daily. Start taking on: October 04, 2020   divalproex 250 MG 24 hr tablet Commonly known as: DEPAKOTE ER Take 1 tablet (250 mg total) by mouth daily. Start taking on: October 04, 2020   escitalopram 5 MG tablet Commonly known as: LEXAPRO Take 1 tablet (5 mg total) by mouth daily. Start taking on: October 04, 2020   feeding supplement (GLUCERNA SHAKE) Liqd Take 237 mLs by mouth 3 (three) times daily between meals.   multivitamin with minerals Tabs tablet Take 1 tablet by mouth daily. Start taking on: October 04, 2020   ramipril 5 MG capsule Commonly known as: ALTACE Take 1 capsule (5 mg total) by mouth daily.   Semaglutide (1 MG/DOSE) 4 MG/3ML Sopn Inject 0.25 mg into the skin once a week for 28 days, THEN 0.5 mg once a week for 28 days, THEN 1 mg once a week for 28 days. Start taking on: September 24, 2020   Semaglutide (1 MG/DOSE) 4 MG/3ML Sopn Inject 1 mg into the skin once a week.   thiamine 100 MG/ML injection Commonly known as: B-1 Inject 1 mL (100 mg total) into the vein daily. Start taking on: October 04, 2020        Follow-up Information     Valerie Roys, DO. Schedule an appointment as soon as possible for a visit in 1  week(s).   Specialty: Family Medicine Contact information: [place] [address] [phone]                Allergies  Allergen Reactions   Ciprofloxacin Palpitations    Severe Headache   Clindamycin/Lincomycin Nausea Only    Made her head hurt   Elemental Sulfur Itching   Hctz [Hydrochlorothiazide] Other (See Comments)    Abdominal pain   Doxycycline Hyclate Palpitations    Consultations: Neurology Psychiatry   Procedures/Studies: DG Chest 2 View  Result Date: 09/30/2020 CLINICAL DATA:  Shortness of breath EXAM: CHEST - 2 VIEW COMPARISON:  05/31/2005 chest radiograph FINDINGS: The cardiomediastinal silhouette is unremarkable. Mild peribronchial thickening is again noted. There is no evidence of focal airspace disease, pulmonary edema, suspicious pulmonary nodule/mass, pleural effusion, or pneumothorax. No acute bony abnormalities are identified. IMPRESSION: No evidence of acute cardiopulmonary disease. Electronically Signed   By: Margarette Canada M.D.   On: 09/30/2020 11:27   CT Head Wo Contrast  Result Date:  09/30/2020 CLINICAL DATA:  87 year old with mental status change. EXAM: CT HEAD WITHOUT CONTRAST TECHNIQUE: Contiguous axial images were obtained from the base of the skull through the vertex without intravenous contrast. COMPARISON:  09/14/2020 and MRI 09/14/2020 FINDINGS: Brain: Again noted is a large calcified lesion in the left middle cranial fossa measures up to 2.1 cm. Based on previous imaging, this is compatible with a left paraclinoid meningioma. Again noted is vasogenic edema in the left temporal lobe that is similar to the prior examination. Stable mild cerebral atrophy. Mild low-density in the white matter is stable. No evidence for acute hemorrhage, midline shift, hemorrhage or large new infarct. Vascular: No hyperdense vessel or unexpected calcification. Skull: Stable appearance of the meningioma in the left middle cranial fossa with apparent involvement  of the cavernous sinus. No acute bone abnormality. Sinuses/Orbits: Visualized paranasal sinuses are clear. Other: None IMPRESSION: 1. Stable head CT. 2. No significant change in the left paraclinoid meningioma. Stable vasogenic edema involving the left temporal lobe. Electronically Signed   By: Markus Daft M.D.   On: 09/30/2020 11:15   CT Head Wo Contrast  Result Date: 09/14/2020 CLINICAL DATA:  Mental status change EXAM: CT HEAD WITHOUT CONTRAST TECHNIQUE: Contiguous axial images were obtained from the base of the skull through the vertex without intravenous contrast. COMPARISON:  None. FINDINGS: Brain: No acute territorial infarction or hemorrhage is visualized. Moderate atrophy. Minimal chronic small vessel ischemic change of the white matter. Low-density edema within the left temporal lobe. 2 x 2 cm partially calcified mass, probably extra-axial and abutting the medial temporal lobe. This appears to be associated with left cavernous sinus, series 3, image 6. Vascular: No hyperdense vessels.  Carotid vascular calcification Skull: Normal. Negative for fracture or focal lesion. Sinuses/Orbits: No acute finding. Other: None IMPRESSION: 1. 2 cm partially calcified mass within the left middle cranial fossa, potentially associated with the left cavernous sinus. Findings could be secondary to meningioma, though giant aneurysm is also in the differential. There is edema within the left temporal lobe. Recommend further evaluation with MRI. 2. Atrophy and chronic small vessel ischemic changes of the white matter. Electronically Signed   By: Donavan Foil M.D.   On: 09/14/2020 16:04   MR ANGIO HEAD WO CONTRAST  Result Date: 09/14/2020 CLINICAL DATA:  Encephalopathy EXAM: MRA HEAD WITHOUT CONTRAST TECHNIQUE: Angiographic images of the Circle of Willis were acquired using MRA technique without intravenous contrast. COMPARISON:  No pertinent prior exam. FINDINGS: POSTERIOR CIRCULATION: --Vertebral arteries: Normal  --Inferior cerebellar arteries: Normal. --Basilar artery: Normal. --Superior cerebellar arteries: Normal. --Posterior cerebral arteries: Normal. ANTERIOR CIRCULATION: --Intracranial internal carotid arteries: Normal. --Anterior cerebral arteries (ACA): Normal. --Middle cerebral arteries (MCA): Normal. ANATOMIC VARIANTS: Both posterior communicating arteries are patent. Other: None. IMPRESSION: Normal intracranial MRA. Electronically Signed   By: Ulyses Jarred M.D.   On: 09/14/2020 22:28   MR Brain W and Wo Contrast  Result Date: 09/14/2020 CLINICAL DATA:  Altered mental status EXAM: MRI HEAD WITHOUT AND WITH CONTRAST TECHNIQUE: Multiplanar, multiecho pulse sequences of the brain and surrounding structures were obtained without and with intravenous contrast. CONTRAST:  7.49mL GADAVIST GADOBUTROL 1 MMOL/ML IV SOLN COMPARISON:  None. FINDINGS: Brain: No acute infarct, mass effect or extra-axial collection. No acute or chronic hemorrhage. Hyperintense T2-weighted signal within the anterior left temporal lobe. The midline structures are normal. There is a contrast-enhancing mass in the left paraclinoid region, which is dural based. It measures 3.1 x 2.3 cm. Vascular: The above-described contrast-enhancing mass extends into  the left cavernous sinus. The left internal carotid artery flow void is preserved. Skull and upper cervical spine: Normal calvarium and skull base. Visualized upper cervical spine and soft tissues are normal. Sinuses/Orbits:No paranasal sinus fluid levels or advanced mucosal thickening. No mastoid or middle ear effusion. Normal orbits. IMPRESSION: 1. No acute intracranial abnormality. 2. Left paraclinoid meningioma with vasogenic edema in the left anterior temporal lobe. There is likely invasion of the left cavernous sinus. Electronically Signed   By: Ulyses Jarred M.D.   On: 09/14/2020 22:25   EEG adult  Result Date: 10/02/2020 Lora Havens, MD     10/02/2020  6:43 PM Patient Name: DEBERAH ADOLF MRN: [id number] Epilepsy Attending: Lora Havens Referring Physician/Provider: Dr Ivor Costa Date: 10/02/2020 Duration: 35.50 mins Patient history: 87yo F with ams. EEG to evaluate for seizure Level of alertness: Awake, asleep AEDs during EEG study: VPA Technical aspects: This EEG study was done with scalp electrodes positioned according to the 10-20 International system of electrode placement. Electrical activity was acquired at a sampling rate of 500Hz  and reviewed with a high frequency filter of 70Hz  and a low frequency filter of 1Hz . EEG data were recorded continuously and digitally stored. Description: The posterior dominant rhythm consists of 9 Hz activity of moderate voltage (25-35 uV) seen predominantly in posterior head regions, symmetric and reactive to eye opening and eye closing. Sleep was characterized by vertex waves, sleep spindles (12 to 14 Hz), maximal frontocentral region. EEG showed intermittent rhythmic sharply contoured 2-3hz  delta slowing in left temporal region. Physiologic photic driving was not seen during photic stimulation.  Hyperventilation was not performed.   ABNORMALITY - Intermittent rhythmic delta slow, left temporal region. IMPRESSION: This study is suggestive of cortical dysfunction arising from left temporal region., nonspecific etiology but likely secondary to underlying structural abnormality/meningioma. Of note, lateralized rhythmic delta activity is on the ictal-interictal continuum with low potential for seizures. No seizures or definite epileptiform discharges were seen throughout the recording. Priyanka Barbra Sarks      Subjective: No headache, chest pain, dyspnea, abdominal pain, vomiting, respiratory distress.  Discharge Exam: Vitals:   10/03/20 0754 10/03/20 1210  BP: (!) 162/61 (!) 166/64  Pulse: (!) 54 (!) 52  Resp: 15 18  Temp: 98.4 F (36.9 C) (!) 97.5 F (36.4 C)  SpO2: 97% 97%   Vitals:   10/02/20 2353 10/03/20 0355 10/03/20 0754 10/03/20 1210   BP: (!) 169/58 (!) 164/68 (!) 162/61 (!) 166/64  Pulse: (!) 56 (!) 51 (!) 54 (!) 52  Resp: 16 16 15 18   Temp: 98.1 F (36.7 C) 98.6 F (37 C) 98.4 F (36.9 C) (!) 97.5 F (36.4 C)  TempSrc:      SpO2: 97% 98% 97% 97%  Weight:      Height:        General: Pt is alert, awake, not in acute distress, sitting up in bed Cardiovascular: RRR, nl S1-S2, no murmurs appreciated.   No LE edema.   Respiratory: Normal respiratory rate and rhythm.  CTAB without rales or wheezes. Abdominal: Abdomen soft and non-tender.  No distension or HSM.   Neuro/Psych: Strength symmetric in upper and lower extremities.  Judgment and insight appear impaired.  Oriented to self only.   The results of significant diagnostics from this hospitalization (including imaging, microbiology, ancillary and laboratory) are listed below for reference.     Microbiology: Recent Results (from the past 240 hour(s))  Resp Panel by RT-PCR (Flu A&B, Covid) Nasopharyngeal Swab  Status: None   Collection Time: 09/30/20 10:38 AM   Specimen: Nasopharyngeal Swab; Nasopharyngeal(NP) swabs in vial transport medium  Result Value Ref Range Status   SARS Coronavirus 2 by RT PCR NEGATIVE NEGATIVE Final    Comment: (NOTE) SARS-CoV-2 target nucleic acids are NOT DETECTED.  The SARS-CoV-2 RNA is generally detectable in upper respiratory specimens during the acute phase of infection. The lowest concentration of SARS-CoV-2 viral copies this assay can detect is 138 copies/mL. A negative result does not preclude SARS-Cov-2 infection and should not be used as the sole basis for treatment or other patient management decisions. A negative result may occur with  improper specimen collection/handling, submission of specimen other than nasopharyngeal swab, presence of viral mutation(s) within the areas targeted by this assay, and inadequate number of viral copies(<138 copies/mL). A negative result must be combined with clinical observations,  patient history, and epidemiological information. The expected result is Negative.  Fact Sheet for Patients:  EntrepreneurPulse.com.au  Fact Sheet for Healthcare Providers:  IncredibleEmployment.be  This test is no t yet approved or cleared by the [place] FDA and  has been authorized for detection and/or diagnosis of SARS-CoV-2 by FDA under an Emergency Use Authorization (EUA). This EUA will remain  in effect (meaning this test can be used) for the duration of the COVID-19 declaration under Section 564(b)(1) of the Act, 21 U.S.C.section 360bbb-3(b)(1), unless the authorization is terminated  or revoked sooner.       Influenza A by PCR NEGATIVE NEGATIVE Final   Influenza B by PCR NEGATIVE NEGATIVE Final    Comment: (NOTE) The Xpert Xpress SARS-CoV-2/FLU/RSV plus assay is intended as an aid in the diagnosis of influenza from Nasopharyngeal swab specimens and should not be used as a sole basis for treatment. Nasal washings and aspirates are unacceptable for Xpert Xpress SARS-CoV-2/FLU/RSV testing.  Fact Sheet for Patients: EntrepreneurPulse.com.au  Fact Sheet for Healthcare Providers: IncredibleEmployment.be  This test is not yet approved or cleared by the [place] FDA and has been authorized for detection and/or diagnosis of SARS-CoV-2 by FDA under an Emergency Use Authorization (EUA). This EUA will remain in effect (meaning this test can be used) for the duration of the COVID-19 declaration under Section 564(b)(1) of the Act, 21 U.S.C. section 360bbb-3(b)(1), unless the authorization is terminated or revoked.  Performed at [facility], [address], [place], [place] [address]   Urine Culture     Status: Abnormal   Collection Time: 09/30/20  7:56 PM   Specimen: Urine, Random  Result Value Ref Range Status   Specimen Description   Final    URINE, RANDOM Performed at [facility], [address], [place], [place] [address]    Special Requests   Final    NONE Performed at [facility], [address] [place], [place] [address]    Culture (A)  Final    >=100,000 COLONIES/mL LACTOBACILLUS SPECIES Standardized susceptibility testing for this organism is not available. Performed at [facility], [place] [address] [place], [place] [address]    Report Status 10/02/2020 FINAL  Final     Labs: BNP (last 3 results) No results for input(s): BNP in the last 8760 hours. Basic Metabolic Panel: Recent Labs  Lab 09/30/20 1033 10/01/20 0659 10/02/20 0504 10/03/20 0647  NA 138 140 140 139  K 3.2* 3.1* 3.2* 3.9  CL 95* 102 102 103  CO2 29 27 29 26   GLUCOSE 343* 178* 123* 175*  BUN 13 10  10 12  CREATININE 0.63 0.61 0.50 0.53  CALCIUM 10.0 9.2 9.2 9.5  MG 1.9  --  1.7 1.8  PHOS  --   --  2.7 3.3   Liver Function Tests: Recent Labs  Lab 09/30/20 1033 10/02/20 0504 10/03/20 0647  AST 34 25 22  ALT 28 25 24   ALKPHOS 100 76 86  BILITOT 1.9* 1.6* 1.5*  PROT 7.1 5.7* 5.9*  ALBUMIN 4.0 3.3* 3.2*   No results for input(s): LIPASE, AMYLASE in the last 168 hours. Recent Labs  Lab 10/01/20 1656  AMMONIA 28   CBC: Recent Labs  Lab 09/30/20 1033 10/02/20 0504 10/03/20 0647  WBC 6.0 4.4 4.2  NEUTROABS 4.5 2.2 2.1  HGB 14.8 12.7 13.3  HCT 44.0 37.0 39.0  MCV 85.6 84.3 85.7  PLT 216 195 202   Cardiac Enzymes: No results for input(s): CKTOTAL, CKMB, CKMBINDEX, TROPONINI in the last 168 hours. BNP: Invalid input(s): POCBNP CBG: Recent Labs  Lab 10/02/20 1147 10/02/20 1726 10/02/20 2131 10/03/20 0752 10/03/20 1209  GLUCAP 183* 135* 120* 168* 163*   D-Dimer No results for input(s): DDIMER in the last 72 hours. Hgb A1c No results for input(s): HGBA1C in the last 72 hours. Lipid Profile No results for input(s): CHOL, HDL, LDLCALC, TRIG, CHOLHDL, LDLDIRECT in the last 72 hours. Thyroid function studies No results for  input(s): TSH, T4TOTAL, T3FREE, THYROIDAB in the last 72 hours.  Invalid input(s): FREET3 Anemia work up Recent Labs    10/01/20 1656  VITAMINB12 283  FOLATE 10.5   Urinalysis    Component Value Date/Time   COLORURINE YELLOW (A) 09/30/2020 1956   APPEARANCEUR HAZY (A) 09/30/2020 1956   APPEARANCEUR Hazy (A) 03/21/2020 1613   LABSPEC 1.027 09/30/2020 1956   LABSPEC 1.024 01/27/2014 1511   PHURINE 5.0 09/30/2020 1956   GLUCOSEU >=500 (A) 09/30/2020 1956   GLUCOSEU >=500 01/27/2014 1511   HGBUR NEGATIVE 09/30/2020 1956   BILIRUBINUR NEGATIVE 09/30/2020 1956   BILIRUBINUR Negative 03/21/2020 1613   BILIRUBINUR Negative 01/27/2014 1511   KETONESUR 80 (A) 09/30/2020 1956   PROTEINUR NEGATIVE 09/30/2020 1956   NITRITE NEGATIVE 09/30/2020 1956   LEUKOCYTESUR NEGATIVE 09/30/2020 1956   LEUKOCYTESUR Negative 01/27/2014 1511   Sepsis Labs Invalid input(s): PROCALCITONIN,  WBC,  LACTICIDVEN Microbiology Recent Results (from the past 240 hour(s))  Resp Panel by RT-PCR (Flu A&B, Covid) Nasopharyngeal Swab     Status: None   Collection Time: 09/30/20 10:38 AM   Specimen: Nasopharyngeal Swab; Nasopharyngeal(NP) swabs in vial transport medium  Result Value Ref Range Status   SARS Coronavirus 2 by RT PCR NEGATIVE NEGATIVE Final    Comment: (NOTE) SARS-CoV-2 target nucleic acids are NOT DETECTED.  The SARS-CoV-2 RNA is generally detectable in upper respiratory specimens during the acute phase of infection. The lowest concentration of SARS-CoV-2 viral copies this assay can detect is 138 copies/mL. A negative result does not preclude SARS-Cov-2 infection and should not be used as the sole basis for treatment or other patient management decisions. A negative result may occur with  improper specimen collection/handling, submission of specimen other than nasopharyngeal swab, presence of viral mutation(s) within the areas targeted by this assay, and inadequate number of viral copies(<138  copies/mL). A negative result must be combined with clinical observations, patient history, and epidemiological information. The expected result is Negative.  Fact Sheet for Patients:  EntrepreneurPulse.com.au  Fact Sheet for Healthcare Providers:  IncredibleEmployment.be  This test is no t yet approved or cleared by the [place]  States FDA and  has been authorized for detection and/or diagnosis of SARS-CoV-2 by FDA under an Emergency Use Authorization (EUA). This EUA will remain  in effect (meaning this test can be used) for the duration of the COVID-19 declaration under Section 564(b)(1) of the Act, 21 U.S.C.section 360bbb-3(b)(1), unless the authorization is terminated  or revoked sooner.       Influenza A by PCR NEGATIVE NEGATIVE Final   Influenza B by PCR NEGATIVE NEGATIVE Final    Comment: (NOTE) The Xpert Xpress SARS-CoV-2/FLU/RSV plus assay is intended as an aid in the diagnosis of influenza from Nasopharyngeal swab specimens and should not be used as a sole basis for treatment. Nasal washings and aspirates are unacceptable for Xpert Xpress SARS-CoV-2/FLU/RSV testing.  Fact Sheet for Patients: EntrepreneurPulse.com.au  Fact Sheet for Healthcare Providers: IncredibleEmployment.be  This test is not yet approved or cleared by the [place] FDA and has been authorized for detection and/or diagnosis of SARS-CoV-2 by FDA under an Emergency Use Authorization (EUA). This EUA will remain in effect (meaning this test can be used) for the duration of the COVID-19 declaration under Section 564(b)(1) of the Act, 21 U.S.C. section 360bbb-3(b)(1), unless the authorization is terminated or revoked.  Performed at [facility], [address], [place], [place] [address]   Urine Culture     Status: Abnormal   Collection Time: 09/30/20  7:56 PM   Specimen: Urine, Random  Result Value Ref Range  Status   Specimen Description   Final    URINE, RANDOM Performed at [facility], [address], [place], [place] [address]    Special Requests   Final    NONE Performed at [facility], [address] [place], [place] [address]    Culture (A)  Final    >=100,000 COLONIES/mL LACTOBACILLUS SPECIES Standardized susceptibility testing for this organism is not available. Performed at [facility], [place] [address], [place], [place] [address]    Report Status 10/02/2020 FINAL  Final     Time coordinating discharge: 45 minutes The [place] controlled substances registry was reviewed for this patient      30 Day Unplanned Readmission Risk Score    Flowsheet Row ED to Hosp-Admission (Current) from 09/30/2020 in [place] (1C)  30 Day Unplanned Readmission Risk Score (%) 14.48 Filed at 10/03/2020 1200       This score is the patient's risk of an unplanned readmission within 30 days of being discharged (0 -100%). The score is based on dignosis, age, lab data, medications, orders, and past utilization.   Low:  0-14.9   Medium: 15-21.9   High: 22-29.9   Extreme: 30 and above             SIGNED:   Edwin Dada, MD  [facility] 10/03/2020, 1:40 PM

## 2020-10-03 NOTE — Progress Notes (Signed)
IV removed by patient. This was the 3rd IV patient has removed since admission. Patient should be discharging tomorrow to facility. MD notified and  he gave verbal order that it is OK for patient to not have an IV

## 2020-10-04 DIAGNOSIS — F411 Generalized anxiety disorder: Secondary | ICD-10-CM | POA: Diagnosis not present

## 2020-10-04 DIAGNOSIS — I10 Essential (primary) hypertension: Secondary | ICD-10-CM | POA: Diagnosis not present

## 2020-10-04 LAB — GLUCOSE, CAPILLARY
Glucose-Capillary: 229 mg/dL — ABNORMAL HIGH (ref 70–99)
Glucose-Capillary: 133 mg/dL — ABNORMAL HIGH (ref 70–99)
Glucose-Capillary: 169 mg/dL — ABNORMAL HIGH (ref 70–99)
Glucose-Capillary: 162 mg/dL — ABNORMAL HIGH (ref 70–99)

## 2020-10-04 MED ORDER — ESCITALOPRAM OXALATE 10 MG PO TABS
10.0000 mg | ORAL_TABLET | Freq: Every day | ORAL | Status: DC
  Administered 2020-10-05 – 2020-10-06 (×2): 10 mg via ORAL
  Filled 2020-10-04 (×2): qty 1

## 2020-10-04 MED ORDER — HYDROXYZINE HCL 25 MG PO TABS
25.0000 mg | ORAL_TABLET | Freq: Three times a day (TID) | ORAL | Status: DC | PRN
  Administered 2020-10-04: 25 mg via ORAL
  Filled 2020-10-04 (×2): qty 1

## 2020-10-04 MED ORDER — DIVALPROEX SODIUM ER 500 MG PO TB24
500.0000 mg | ORAL_TABLET | Freq: Every day | ORAL | Status: DC
  Administered 2020-10-05 – 2020-10-06 (×2): 500 mg via ORAL
  Filled 2020-10-04 (×2): qty 1

## 2020-10-04 MED ORDER — HYDRALAZINE HCL 50 MG PO TABS
50.0000 mg | ORAL_TABLET | Freq: Three times a day (TID) | ORAL | Status: DC
  Administered 2020-10-05 – 2020-10-08 (×9): 50 mg via ORAL
  Filled 2020-10-04 (×9): qty 1

## 2020-10-04 MED ORDER — HYDRALAZINE HCL 50 MG PO TABS
25.0000 mg | ORAL_TABLET | Freq: Three times a day (TID) | ORAL | Status: DC
  Administered 2020-10-04 (×3): 25 mg via ORAL
  Filled 2020-10-04 (×3): qty 1

## 2020-10-04 MED ORDER — LORAZEPAM 0.5 MG PO TABS
0.5000 mg | ORAL_TABLET | Freq: Every day | ORAL | Status: DC | PRN
  Administered 2020-10-05 – 2020-10-06 (×2): 0.5 mg via ORAL
  Filled 2020-10-04 (×2): qty 1

## 2020-10-04 NOTE — Progress Notes (Signed)
PROGRESS NOTE    Daisy Watkins  IRW:[id number] DOB: 1932/04/27 DOA: 09/30/2020 PCP: Valerie Roys, DO   Chief Complaint  Patient presents with   Altered Mental Status     Brief Narrative: 87 year old female with PMH of Chronic Dementia with behavioral disturbances, Meningioma (09/14/20 MRI), HTN, DM2, CAD, Afib/DVT (on Eliquis) who presents to the ED on 7/3 with acute confusion, agitation with intermittent crying, and difficulty taking care of herself and taking home medications.  Patient was admitted for SNF placement.  Subjective: Neurological exam is stable.  There are no acute deficits.  There have been no reported seizure like activity. Denies any dysuria or urinary symptoms. Patient states "I want to go home." I received reports of agitation throughout the day. Psychiatry re-evaluated the patient today, appreciate.   Assessment & Plan: Principal Problem:   Acute metabolic encephalopathy Active Problems:   Type 2 diabetes mellitus with hyperglycemia (HCC)   Coronary artery disease   Benign essential hypertension   Paroxysmal A-fib (HCC)   Anxiety, generalized   History of DVT (deep vein thrombosis)   Psychosis in elderly with behavioral disturbance (HCC)   AMS (altered mental status)   Meningioma (HCC)   Hypokalemia  Dementia with Behavioral Disturbances: Patient has intermittent episodes of agitation.  She has no SI/HI. - Increase Lexapro 5 to 10 mg daily.   - Increase Depakote from 250 to 500 mg daily. - Continue Hydroxyzine 25 mg TID PRN for anxiety.  This can be increased to 50 mg if needed. - Add on Ativan 0.5 mg PRN for agitation.  Left Anterior Lobe Meningioma: This may be contributing to her fluctuating mental status. - Increase Depakote from 250 to 500 mg daily.  This will mange seizures and any psychiatric disturbances. - Patient was evaluated by Neurosurgery on 09/14/20 admission and was deemed to be poor surgical candidate. - She will follow up with  Rad/Onc outpatient.  Paroxysmal Atrial Fibrillation: - HR runs low - hold off on rate controlling agent. - Continue Eliquis 5 mg BID for anticoagulation.  History of DVT, unprovoked: - Continue Eliquis for anticoagulation.  Hypertension: - Increase Hydralazine from 25 to 50 mg TID. - Continue ACE.  Diabetes Mellitus Type 2: - Glucose is stable. - Continue Aspart SSi with POC glucose ACHS.  Coronary Artery Disease: - Continue Eliquis.  Out of age range for Statin.  And patient does not tolerate side effects.  Lactobacillus Urine Cuture: This is a normal flora of the female GU tract / contaminant.   Diet Order             Diet Carb Modified Fluid consistency: Thin; Room service appropriate? Yes with Assist  Diet effective now                   Nutrition Problem: Inadequate oral intake Etiology: poor appetite Signs/Symptoms: per patient/family report, meal completion < 50% Interventions: Liberalize Diet, MVI, Glucerna shake Patient's Body mass index is 26.5 kg/m.      DVT prophylaxis:  Code Status:   Code Status: Full Code  Family Communication: plan of care discussed with patient at bedside.  Status is: Inpatient  Remains inpatient appropriate because:Inpatient level of care appropriate due to severity of illness  Dispo: The patient is from: ALF -              Anticipated d/c is to: SNF - Unable to discharge to [facility].  Family is unavailable.  Application was sent for guardianship with  the state.              Patient currently is medically stable to d/c.   Difficult to place patient Yes   Unresulted Labs (From admission, onward)     Start     Ordered   10/02/20 0825  Vitamin B1  Once,   R       Question:  Specimen collection method  Answer:  Lab=Lab collect   10/02/20 0825   10/02/20 0741  Methylmalonic acid, serum  Add-on,   AD       Question:  Specimen collection method  Answer:  Lab=Lab collect   10/02/20 0740             Medications  reviewed:  Scheduled Meds:  apixaban  5 mg Oral BID   divalproex  250 mg Oral Daily   escitalopram  5 mg Oral Daily   feeding supplement (GLUCERNA SHAKE)  237 mL Oral TID BM   hydrALAZINE  25 mg Oral Q8H   insulin aspart  0-15 Units Subcutaneous TID WC   insulin aspart  0-5 Units Subcutaneous QHS   multivitamin with minerals  1 tablet Oral Daily   ramipril  5 mg Oral Daily   thiamine injection  100 mg Intravenous Daily   vitamin B-12  1,000 mcg Oral Daily   Continuous Infusions:  Consultants:see note  Procedures:see note  Antimicrobials: Anti-infectives (From admission, onward)    None      Culture/Microbiology    Component Value Date/Time   SDES  09/30/2020 1956    URINE, RANDOM Performed at [facility], [address], [place], [place] [address]    [facility]  09/30/2020 1956    NONE Performed at [facility], [place] [place], [place] [address]    CULT (A) 09/30/2020 1956    >=100,000 COLONIES/mL LACTOBACILLUS SPECIES Standardized susceptibility testing for this organism is not available. Performed at [facility], [place] [address], [place], [place] [address]    REPTSTATUS 10/02/2020 FINAL 09/30/2020 1956    Other culture-see note  Objective: Vitals: Today's Vitals   10/04/20 1434 10/04/20 1618 10/04/20 1950 10/04/20 2056  BP: (!) 141/55 (!) 140/55  (!) 184/76  Pulse: 63 67  72  Resp:  18  20  Temp:  97.6 F (36.4 C)  98 F (36.7 C)  TempSrc:  Oral  Oral  SpO2:  97%  97%  Weight:      Height:      PainSc:   0-No pain     Intake/Output Summary (Last 24 hours) at 10/04/2020 2104 Last data filed at 10/04/2020 1850 Gross per 24 hour  Intake 0 ml  Output --  Net 0 ml   Filed Weights   09/30/20 1031  Weight: 74.5 kg   Weight change:   Intake/Output from previous day: 07/06 0701 - 07/07 0700 In: 480 [P.O.:480] Out: -  Intake/Output this shift: No intake/output data recorded. Filed Weights   09/30/20 1031   Weight: 74.5 kg    Examination: General exam: alert and oriented, mental status intact, fatigued and physically deconditioned HEENT: NCAT, PERRL Respiratory system: CTAB no WRR Cardiovascular system: Did not appreciate a murmur, regular, No JVD. Gastrointestinal system: No flank pain, Abdomen soft, NT,ND, BS+. Nervous System: No focal deficits. Extremities: No edema, distal peripheral pulses palpable.  Skin: No rashes, No bruises, No icterus. MSK: Physical Deconditioning   Data Reviewed: I have personally reviewed following labs and imaging studies CBC: Recent Labs  Lab 09/30/20 1033 10/02/20 0504 10/03/20 0647  WBC 6.0 4.4 4.2  NEUTROABS 4.5 2.2 2.1  HGB 14.8 12.7 13.3  HCT 44.0 37.0 39.0  MCV 85.6 84.3 85.7  PLT 216 195 921   Basic Metabolic Panel: Recent Labs  Lab 09/30/20 1033 10/01/20 0659 10/02/20 0504 10/03/20 0647  NA 138 140 140 139  K 3.2* 3.1* 3.2* 3.9  CL 95* 102 102 103  CO2 29 27 29 26   GLUCOSE 343* 178* 123* 175*  BUN 13 10 10 12   CREATININE 0.63 0.61 0.50 0.53  CALCIUM 10.0 9.2 9.2 9.5  MG 1.9  --  1.7 1.8  PHOS  --   --  2.7 3.3   GFR: Estimated Creatinine Clearance: 52.1 mL/min (by C-G formula based on SCr of 0.53 mg/dL). Liver Function Tests: Recent Labs  Lab 09/30/20 1033 10/02/20 0504 10/03/20 0647  AST 34 25 22  ALT 28 25 24   ALKPHOS 100 76 86  BILITOT 1.9* 1.6* 1.5*  PROT 7.1 5.7* 5.9*  ALBUMIN 4.0 3.3* 3.2*    Recent Labs  Lab 10/01/20 1656  AMMONIA 28    CBG: Recent Labs  Lab 10/03/20 2056 10/04/20 0734 10/04/20 1157 10/04/20 1617 10/04/20 2100  GLUCAP 167* 229* 133* 169* 162*     Recent Results (from the past 240 hour(s))  Resp Panel by RT-PCR (Flu A&B, Covid) Nasopharyngeal Swab     Status: None   Collection Time: 09/30/20 10:38 AM   Specimen: Nasopharyngeal Swab; Nasopharyngeal(NP) swabs in vial transport medium  Result Value Ref Range Status   SARS Coronavirus 2 by RT PCR NEGATIVE NEGATIVE Final     Comment: (NOTE) SARS-CoV-2 target nucleic acids are NOT DETECTED.  The SARS-CoV-2 RNA is generally detectable in upper respiratory specimens during the acute phase of infection. The lowest concentration of SARS-CoV-2 viral copies this assay can detect is 138 copies/mL. A negative result does not preclude SARS-Cov-2 infection and should not be used as the sole basis for treatment or other patient management decisions. A negative result may occur with  improper specimen collection/handling, submission of specimen other than nasopharyngeal swab, presence of viral mutation(s) within the areas targeted by this assay, and inadequate number of viral copies(<138 copies/mL). A negative result must be combined with clinical observations, patient history, and epidemiological information. The expected result is Negative.  Fact Sheet for Patients:  EntrepreneurPulse.com.au  Fact Sheet for Healthcare Providers:  IncredibleEmployment.be  This test is no t yet approved or cleared by the [place] FDA and  has been authorized for detection and/or diagnosis of SARS-CoV-2 by FDA under an Emergency Use Authorization (EUA). This EUA will remain  in effect (meaning this test can be used) for the duration of the COVID-19 declaration under Section 564(b)(1) of the Act, 21 U.S.C.section 360bbb-3(b)(1), unless the authorization is terminated  or revoked sooner.       Influenza A by PCR NEGATIVE NEGATIVE Final   Influenza B by PCR NEGATIVE NEGATIVE Final    Comment: (NOTE) The Xpert Xpress SARS-CoV-2/FLU/RSV plus assay is intended as an aid in the diagnosis of influenza from Nasopharyngeal swab specimens and should not be used as a sole basis for treatment. Nasal washings and aspirates are unacceptable for Xpert Xpress SARS-CoV-2/FLU/RSV testing.  Fact Sheet for Patients: EntrepreneurPulse.com.au  Fact Sheet for Healthcare  Providers: IncredibleEmployment.be  This test is not yet approved or cleared by the [place] FDA and has been authorized for detection and/or diagnosis of SARS-CoV-2 by FDA under an Emergency  Use Authorization (EUA). This EUA will remain in effect (meaning this test can be used) for the duration of the COVID-19 declaration under Section 564(b)(1) of the Act, 21 U.S.C. section 360bbb-3(b)(1), unless the authorization is terminated or revoked.  Performed at [facility], [address], [place], [place] [address]   Urine Culture     Status: Abnormal   Collection Time: 09/30/20  7:56 PM   Specimen: Urine, Random  Result Value Ref Range Status   Specimen Description   Final    URINE, RANDOM Performed at [facility], [address] [place], [place] [address]    Special Requests   Final    NONE Performed at [facility], [address] [place], [place] [address]    Culture (A)  Final    >=100,000 COLONIES/mL LACTOBACILLUS SPECIES Standardized susceptibility testing for this organism is not available. Performed at [facility], [place] [address], [place], [place] [address]    Report Status 10/02/2020 FINAL  Final     Radiology Studies: No results found.   LOS: 3 days   George Hugh, MD [facility]  10/04/2020, 9:04 PM

## 2020-10-04 NOTE — NC FL2 (Signed)
[place] LEVEL OF CARE SCREENING TOOL     IDENTIFICATION  Patient Name: Daisy Watkins Birthdate: Nov 26, 1932 Sex: female Admission Date (Current Location): 09/30/2020  [facility] and [place] Number:  Engineering geologist and Address:  [facility], [address], [place], [place] [address]      Provider Number: [id number]  Attending Physician Name and Address:  George Hugh, MD  Relative Name and Phone Number:  [name] (daughter) [phone]    Current Level of Care: Hospital Recommended Level of Care: [place], Memory Care Prior Approval Number:    Date Approved/Denied:   PASRR Number:    Discharge Plan: Other (Comment) (Memory Care for Dementia)    Current Diagnoses: Patient Active Problem List   Diagnosis Date Noted   Acute metabolic encephalopathy 81/27/5170   Hypokalemia 09/30/2020   Malnutrition of moderate degree ([place]) 09/24/2020   Meningioma ([place]) 09/15/2020   Confusion 09/15/2020   Psychosis in elderly with behavioral disturbance ([place]) 09/14/2020   AMS (altered mental status) 09/14/2020   Shoulder impingement 05/01/2017   Insomnia 11/12/2016   DNR (do not resuscitate) 09/30/2016   Advance directive discussed with patient 09/30/2016   Allergic rhinitis 11/23/2015   DVT, lower extremity, recurrent ([place]) 12/26/2014   Anxiety, generalized 11/23/2014   Aortic atherosclerosis ([place]) 09/28/2014   Coronary artery disease 09/28/2014   Fatty liver 09/28/2014   Paroxysmal A-fib ([place]) 09/28/2014   Benign essential hypertension 09/26/2014   GERD (gastroesophageal reflux disease) 09/26/2014   History of DVT (deep vein thrombosis) 09/26/2014   Type 2 diabetes mellitus with hyperglycemia ([place]) 09/21/2014   Hyperlipidemia, mixed 09/21/2014   Postzoster neuralgia 09/21/2014    Orientation RESPIRATION BLADDER Height & Weight     Self, Place  Normal Continent Weight: 74.5 kg Height:  5\' 6"  (167.6 cm)   BEHAVIORAL SYMPTOMS/MOOD NEUROLOGICAL BOWEL NUTRITION STATUS      Continent Diet (Heart Healthy/ Carb modified)  AMBULATORY STATUS COMMUNICATION OF NEEDS Skin   Independent Verbally Normal                       Personal Care Assistance Level of Assistance  Bathing, Feeding, Dressing Bathing Assistance: Limited assistance Feeding assistance: Limited assistance Dressing Assistance: Limited assistance     Functional Limitations Info  Sight, Hearing, Speech Sight Info: Adequate Hearing Info: Adequate Speech Info: Adequate    SPECIAL CARE FACTORS FREQUENCY                       Contractures Contractures Info: Not present    Additional Factors Info  Code Status, Allergies Code Status Info: Full Allergies Info: Ciprofloxacin, Clindamycin, elemental sulfur, HCTZ, doxycycline           Current Medications (10/04/2020):  This is the current hospital active medication list Current Facility-Administered Medications  Medication Dose Route Frequency Provider Last Rate Last Admin   acetaminophen (TYLENOL) suppository 650 mg  650 mg Rectal Q6H PRN Ivor Costa, MD       acetaminophen (TYLENOL) tablet 650 mg  650 mg Oral Q6H PRN Ivor Costa, MD       apixaban Arne Cleveland) tablet 5 mg  5 mg Oral BID Ivor Costa, MD   5 mg at 10/03/20 2120   divalproex (DEPAKOTE ER) 24 hr tablet 250 mg  250 mg Oral Daily Derek Jack, MD   250 mg at 10/03/20 0842   escitalopram (LEXAPRO) tablet 5 mg  5 mg Oral Daily  Patrecia Pour, NP   5 mg at 10/03/20 7373   feeding supplement (GLUCERNA SHAKE) (GLUCERNA SHAKE) liquid 237 mL  237 mL Oral TID BM Elodia Florence., MD   237 mL at 10/02/20 2140   hydrALAZINE (APRESOLINE) injection 5 mg  5 mg Intravenous Q2H PRN Ivor Costa, MD   5 mg at 10/04/20 0750   hydrALAZINE (APRESOLINE) tablet 25 mg  25 mg Oral Q8H Masoud, Jarrett Soho, MD       hydrOXYzine (ATARAX/VISTARIL) tablet 10 mg  10 mg Oral TID PRN Patrecia Pour, NP       insulin aspart (novoLOG)  injection 0-15 Units  0-15 Units Subcutaneous TID WC Ivor Costa, MD   5 Units at 10/04/20 0744   insulin aspart (novoLOG) injection 0-5 Units  0-5 Units Subcutaneous QHS Ivor Costa, MD   2 Units at 10/01/20 2141   multivitamin with minerals tablet 1 tablet  1 tablet Oral Daily Elodia Florence., MD   1 tablet at 10/03/20 0842   ondansetron [facility]) injection 4 mg  4 mg Intravenous Q8H PRN Ivor Costa, MD       ramipril (ALTACE) capsule 5 mg  5 mg Oral Daily Ivor Costa, MD   5 mg at 10/03/20 6681   thiamine (B-1) injection 100 mg  100 mg Intravenous Daily Bhagat, Srishti L, MD   100 mg at 10/03/20 5947   vitamin B-12 (CYANOCOBALAMIN) tablet 1,000 mcg  1,000 mcg Oral Daily Elodia Florence., MD   1,000 mcg at 10/03/20 0845     Discharge Medications: Please see discharge summary for a list of discharge medications.  Relevant Imaging Results:  Relevant Lab Results:   Additional Information    Shelbie Hutching, RN

## 2020-10-04 NOTE — Plan of Care (Signed)
Pt pacing in room periodically during night. Pt anxious to go home. Packing up belongings pillow etc. Pt attempted to close computer on scanner and broke screen dropped scanner in trash. Once staff enter room pt calm cooperative. Whole noted in wall. Charge Nurse made aware of damage. Bp was elevated but once resting and cooperative BP decreased.  Problem: Education: Goal: Knowledge of General Education information will improve Description: Including pain rating scale, medication(s)/side effects and non-pharmacologic comfort measures Outcome: Progressing   Problem: Health Behavior/Discharge Planning: Goal: Ability to manage health-related needs will improve Outcome: Progressing   Problem: Clinical Measurements: Goal: Ability to maintain clinical measurements within normal limits will improve Outcome: Progressing Goal: Will remain free from infection Outcome: Progressing Goal: Diagnostic test results will improve Outcome: Progressing   Problem: Activity: Goal: Risk for activity intolerance will decrease Outcome: Progressing   Problem: Nutrition: Goal: Adequate nutrition will be maintained Outcome: Progressing   Problem: Coping: Goal: Level of anxiety will decrease Outcome: Progressing   Problem: Pain Managment: Goal: General experience of comfort will improve Outcome: Progressing   Problem: Safety: Goal: Ability to remain free from injury will improve Outcome: Progressing   Problem: Skin Integrity: Goal: Risk for impaired skin integrity will decrease Outcome: Progressing

## 2020-10-04 NOTE — Care Management Important Message (Signed)
Important Message  Patient Details  Name: Daisy Watkins MRN: [id number] Date of Birth: 06-01-1932   Medicare Important Message Given:  N/A - LOS <3 / Initial given by admissions     Juliann Pulse A [name] 10/04/2020, 9:35 AM

## 2020-10-04 NOTE — TOC Progression Note (Signed)
Transition of Care [facility]) - Progression Note    Patient Details  Name: Daisy Watkins MRN: [id number] Date of Birth: [birth year]  [facility] CM/SW Contact  Shelbie Hutching, RN Phone Number: 10/04/2020, 2:22 PM  Clinical Narrative:    Patient's daughter has been having discussions with [facility] about getting her mother there for memory care before patient was admitted to the hospital.  Tammy at [facility] reported that they had a Memory Care bed and would be happy to accept patient.  Daughter was going to go and fill out paperwork and get the payment taken care of yesterday or today.  Daughter did not go to [place] and when this RNCM called her she reported that she needed to apply for Medicaid and get in touch with social services.  [facility] cannot accept without payment source, RNCM was unaware that the family had not already discussed this with the ALF.   RNCM informed [name] that the patient cannot stay in the hospital for this process to be completed it takes and extended period of time for Medicaid to process and be approved.  Informed daughter that she needed to come and pick the patient up.  [name] says there is no way she can take her mother and she will not come get her.  She also reports that the daughter in law will also not come and get her.  RNCM informed daughter that APS would be contacted for abandonment.  Daughter verbalized  understanding.  RNCM making APS report.    Expected Discharge Plan: Assisted Living Barriers to Discharge: Continued Medical Work up  Expected Discharge Plan and Services Expected Discharge Plan: Assisted Living   Discharge Planning Services: CM Consult   Living arrangements for the past 2 months: Single Family Home Expected Discharge Date: 10/03/20               DME Arranged: N/A DME Agency: NA       HH Arranged: NA           Social Determinants of Health (SDOH) Interventions    Readmission Risk Interventions No  flowsheet data found.

## 2020-10-04 NOTE — Consult Note (Signed)
[facility] Face-to-Face Psychiatry Consult   Reason for Consult:  anxiety, depression; poor self care Referring Physician:  EDP Patient Identification: Daisy Watkins MRN:  [id number] Principal Diagnosis: Acute metabolic encephalopathy Diagnosis:  Principal Problem:   Acute metabolic encephalopathy Active Problems:   Anxiety, generalized   Type 2 diabetes mellitus with hyperglycemia (HCC)   Coronary artery disease   Benign essential hypertension   Paroxysmal A-fib (HCC)   History of DVT (deep vein thrombosis)   Psychosis in elderly with behavioral disturbance (HCC)   AMS (altered mental status)   Meningioma (HCC)   Hypokalemia   Total Time spent with patient: 1 hour  Subjective:   Daisy Watkins is a 87 y.o. female patient admitted with metabolic encephalopathy and UTI.  87 yo female with meningioma and anxiety and agitation at times.  She is medically and psychiatrically cleared but her daughters are not picking her up as they can no longer care for her.  SNF or assisted living is being pursued.  When this provider met with her, she is dressed and calmly lying on her bed watching television.  She is able to carry on a conversation without issues.  Denies pain, suicidal/homicidal ideations, depression, and anxiety.  She states, "I want to go home."  Anxiety and agitation medications in place.  10/01/20: 87 yo female with depression and anxiety, meningioma and other health issues.  Her family was concerned about her lack of self-care.  On assessment, she is clear and coherent.  She stated, "I'm good, how are you?"  When asked what brought her to the ED, "I got sick."  Denies any discomforts today.  When asked about her depression/mood, she reported, "Everybody has depression, it's called stress."  She reports her appetite is "good" along with sleep.  No suicidal/homicidal ideations or hallucinations.  She continues to report "lorazepam" helps her, this was discontinued months ago.  This provider  recommends a SSRI and prn hydroxyzine to better assist her depression and anxiety, see treatment plan.  She does not meet criteria for psychiatric admission.  Per MD, Dr Blaine Hamper: Per her daughter (I called her daughter by phone), since this morning patient has been more confused, agitated, screaming around.  States that the patient did not fall, just slide down from commode last night.  No significant injury.  Daughter states that patient did not pass out, but per EDP, "Patient was unresponsive when EMS got there but then started waking up and was able to follow commands but was nonverbal". When I saw pt in ED, she is confused, but knows her own name, knows that she is in hospital.  She is confused about time.  She is emotional and crying.  No hallucination.  Denies suicidal or homicidal ideations.  She moves all extremities normally.  No facial droop or slurred speech.  She has mild dry cough, denies chest pain, shortness of breath.  Patient states that sometimes that she had diarrhea recently, but no diarrhea today.  Denies nausea, vomiting or abdominal pain.  No symptoms of UTI.  Per ED physician, "there is also concern from family that she has not been taking her medication and not taking care of herself therefore IVC was placed".  HPI:  87 yo female with depression and anxiety  Past Psychiatric History: depression, anxiety  Risk to Self:  none Risk to Others:  none Prior Inpatient Therapy:  none Prior Outpatient Therapy:  none  Past Medical History:  Past Medical History:  Diagnosis Date  Anxiety    Constipation 08/17/2015   Diabetes mellitus without complication (HCC)    DVT (deep venous thrombosis) ([place]) 2006   chronic anticoagulation   Encounter for monitoring Coumadin therapy 12/16/2016   GERD (gastroesophageal reflux disease)    Hyperlipidemia    Hypertension    Insomnia    Monitoring for anticoagulant use 09/21/2014   Goal INR 2-3; recurrent DVTs, paroxysmal atrial fibrillation.   Unable to take Xaralto   Myalgia 02/23/2017   Sebaceous cyst 06/11/2015   Skin lesion of back 09/05/2016   Trapezius muscle spasm 05/22/2017   Urinary frequency 11/12/2016   Vitamin B12 deficiency    Vitamin D deficiency disease     Past Surgical History:  Procedure Laterality Date   ABDOMINAL HYSTERECTOMY     Family History:  Family History  Problem Relation Age of Onset   Diabetes Maternal Grandmother    Hypertension Mother    Cancer Daughter        breast   Heart disease Son    Heart disease Son    Family Psychiatric  History: none Social History:  Social History   Substance and Sexual Activity  Alcohol Use No   Alcohol/week: 0.0 standard drinks   Comment: rare     Social History   Substance and Sexual Activity  Drug Use No    Social History   Socioeconomic History   Marital status: Divorced    Spouse name: Not on file   Number of children: Not on file   Years of education: Not on file   Highest education level: GED or equivalent  Occupational History   Not on file  Tobacco Use   Smoking status: Former    Packs/day: 0.50    Years: 30.00    Pack years: 15.00    Types: Cigarettes    Quit date: 03/31/1977    Years since quitting: 43.5   Smokeless tobacco: Never  Vaping Use   Vaping Use: Never used  Substance and Sexual Activity   Alcohol use: No    Alcohol/week: 0.0 standard drinks    Comment: rare   Drug use: No   Sexual activity: Not Currently  Other Topics Concern   Not on file  Social History Narrative   Not on file   Social Determinants of Health   Financial Resource Strain: Medium Risk   Difficulty of Paying Living Expenses: Somewhat hard  Food Insecurity: No Food Insecurity   Worried About Charity fundraiser in the Last Year: Never true   Ran Out of Food in the Last Year: Never true  Transportation Needs: No Transportation Needs   Lack of Transportation (Medical): No   Lack of Transportation (Non-Medical): No  Physical Activity:  Insufficiently Active   Days of Exercise per Week: 2 days   Minutes of Exercise per Session: 20 min  Stress: Stress Concern Present   Feeling of Stress : To some extent  Social Connections: Not on file   Additional Social History:    Allergies:   Allergies  Allergen Reactions   Ciprofloxacin Palpitations    Severe Headache   Clindamycin/Lincomycin Nausea Only    Made her head hurt   Elemental Sulfur Itching   Hctz [Hydrochlorothiazide] Other (See Comments)    Abdominal pain   Doxycycline Hyclate Palpitations    Labs:  Results for orders placed or performed during the hospital encounter of 09/30/20 (from the past 48 hour(s))  RPR     Status: None   Collection Time: 10/02/20  6:22 PM  Result Value Ref Range   RPR Ser Ql NON REACTIVE NON REACTIVE    Comment: Performed at [facility], [place] [address], [place], [place] [address]  HIV Antibody (routine testing w rflx)     Status: None   Collection Time: 10/02/20  6:22 PM  Result Value Ref Range   HIV Screen 4th Generation wRfx Non Reactive Non Reactive    Comment: Performed at [facility], [place] [address] [place], [place] [address]  Glucose, capillary     Status: Abnormal   Collection Time: 10/02/20  9:31 PM  Result Value Ref Range   Glucose-Capillary 120 (H) 70 - 99 mg/dL    Comment: Glucose reference range applies only to samples taken after fasting for at least 8 hours.  CBC with Differential/Platelet     Status: None   Collection Time: 10/03/20  6:47 AM  Result Value Ref Range   WBC 4.2 4.0 - 10.5 K/uL   RBC 4.55 3.87 - 5.11 MIL/uL   Hemoglobin 13.3 12.0 - 15.0 g/dL   HCT 39.0 36.0 - 46.0 %   MCV 85.7 80.0 - 100.0 fL   MCH 29.2 26.0 - 34.0 pg   MCHC 34.1 30.0 - 36.0 g/dL   RDW 13.8 11.5 - 15.5 %   Platelets 202 150 - 400 K/uL   nRBC 0.0 0.0 - 0.2 %   Neutrophils Relative % 49 %   Neutro Abs 2.1 1.7 - 7.7 K/uL   Lymphocytes Relative 34 %   Lymphs Abs 1.4 0.7 - 4.0 K/uL   Monocytes Relative 12 %    Monocytes Absolute 0.5 0.1 - 1.0 K/uL   Eosinophils Relative 3 %   Eosinophils Absolute 0.1 0.0 - 0.5 K/uL   Basophils Relative 1 %   Basophils Absolute 0.0 0.0 - 0.1 K/uL   Immature Granulocytes 1 %   Abs Immature Granulocytes 0.02 0.00 - 0.07 K/uL    Comment: Performed at [facility], [address] [place], [place] [address]  Comprehensive metabolic panel     Status: Abnormal   Collection Time: 10/03/20  6:47 AM  Result Value Ref Range   Sodium 139 135 - 145 mmol/L   Potassium 3.9 3.5 - 5.1 mmol/L   Chloride 103 98 - 111 mmol/L   CO2 26 22 - 32 mmol/L   Glucose, Bld 175 (H) 70 - 99 mg/dL    Comment: Glucose reference range applies only to samples taken after fasting for at least 8 hours.   BUN 12 8 - 23 mg/dL   Creatinine, Ser 0.53 0.44 - 1.00 mg/dL   Calcium 9.5 8.9 - 10.3 mg/dL   Total Protein 5.9 (L) 6.5 - 8.1 g/dL   Albumin 3.2 (L) 3.5 - 5.0 g/dL   AST 22 15 - 41 U/L   ALT 24 0 - 44 U/L   Alkaline Phosphatase 86 38 - 126 U/L   Total Bilirubin 1.5 (H) 0.3 - 1.2 mg/dL   GFR, Estimated >60 >60 mL/min    Comment: (NOTE) Calculated using the CKD-EPI Creatinine Equation (2021)    Anion gap 10 5 - 15    Comment: Performed at [facility], [address], [place], [place] [address]  Magnesium     Status: None   Collection Time: 10/03/20  6:47 AM  Result Value Ref Range   Magnesium 1.8 1.7 - 2.4 mg/dL    Comment: Performed at [facility], [address] [place], [place]  [address]  Phosphorus     Status: None   Collection Time: 10/03/20  6:47 AM  Result Value Ref Range   Phosphorus 3.3 2.5 - 4.6 mg/dL    Comment: Performed at [facility], [address] [place], [place] [address]  Glucose, capillary     Status: Abnormal   Collection Time: 10/03/20  7:52 AM  Result Value Ref Range   Glucose-Capillary 168 (H) 70 - 99 mg/dL    Comment: Glucose reference range applies only to samples taken after fasting for at least 8 hours.   Glucose, capillary     Status: Abnormal   Collection Time: 10/03/20 12:09 PM  Result Value Ref Range   Glucose-Capillary 163 (H) 70 - 99 mg/dL    Comment: Glucose reference range applies only to samples taken after fasting for at least 8 hours.  Glucose, capillary     Status: Abnormal   Collection Time: 10/03/20  3:59 PM  Result Value Ref Range   Glucose-Capillary 138 (H) 70 - 99 mg/dL    Comment: Glucose reference range applies only to samples taken after fasting for at least 8 hours.  Glucose, capillary     Status: Abnormal   Collection Time: 10/03/20  8:56 PM  Result Value Ref Range   Glucose-Capillary 167 (H) 70 - 99 mg/dL    Comment: Glucose reference range applies only to samples taken after fasting for at least 8 hours.  Glucose, capillary     Status: Abnormal   Collection Time: 10/04/20  7:34 AM  Result Value Ref Range   Glucose-Capillary 229 (H) 70 - 99 mg/dL    Comment: Glucose reference range applies only to samples taken after fasting for at least 8 hours.  Glucose, capillary     Status: Abnormal   Collection Time: 10/04/20 11:57 AM  Result Value Ref Range   Glucose-Capillary 133 (H) 70 - 99 mg/dL    Comment: Glucose reference range applies only to samples taken after fasting for at least 8 hours.  Glucose, capillary     Status: Abnormal   Collection Time: 10/04/20  4:17 PM  Result Value Ref Range   Glucose-Capillary 169 (H) 70 - 99 mg/dL    Comment: Glucose reference range applies only to samples taken after fasting for at least 8 hours.    Current Facility-Administered Medications  Medication Dose Route Frequency Provider Last Rate Last Admin   acetaminophen (TYLENOL) suppository 650 mg  650 mg Rectal Q6H PRN Ivor Costa, MD       acetaminophen (TYLENOL) tablet 650 mg  650 mg Oral Q6H PRN Ivor Costa, MD       apixaban Arne Cleveland) tablet 5 mg  5 mg Oral BID Ivor Costa, MD   5 mg at 10/04/20 0954   divalproex (DEPAKOTE ER) 24 hr tablet 250 mg  250 mg Oral Daily Derek Jack, MD   250 mg at 10/04/20 3976   escitalopram (LEXAPRO) tablet 5 mg  5 mg Oral Daily Patrecia Pour, NP   5 mg at 10/04/20 0953   feeding supplement (GLUCERNA SHAKE) (GLUCERNA SHAKE) liquid 237 mL  237 mL Oral TID BM Elodia Florence., MD   237 mL at 10/04/20 1436   hydrALAZINE (APRESOLINE) injection 5 mg  5 mg Intravenous Q2H PRN Ivor Costa, MD   5 mg at 10/04/20 0750   hydrALAZINE (APRESOLINE) tablet 25 mg  25 mg Oral Q8H George Hugh, MD   25 mg at 10/04/20 1435   insulin  aspart (novoLOG) injection 0-15 Units  0-15 Units Subcutaneous TID WC Ivor Costa, MD   3 Units at 10/04/20 1634   insulin aspart (novoLOG) injection 0-5 Units  0-5 Units Subcutaneous QHS Ivor Costa, MD   2 Units at 10/01/20 2141   multivitamin with minerals tablet 1 tablet  1 tablet Oral Daily Elodia Florence., MD   1 tablet at 10/04/20 0953   ondansetron [facility]) injection 4 mg  4 mg Intravenous Q8H PRN Ivor Costa, MD       ramipril (ALTACE) capsule 5 mg  5 mg Oral Daily Ivor Costa, MD   5 mg at 10/04/20 8333   thiamine (B-1) injection 100 mg  100 mg Intravenous Daily Bhagat, Srishti L, MD   100 mg at 10/03/20 8329   vitamin B-12 (CYANOCOBALAMIN) tablet 1,000 mcg  1,000 mcg Oral Daily Elodia Florence., MD   1,000 mcg at 10/04/20 1916    Musculoskeletal: Strength & Muscle Tone: decreased Gait & Station:  did not assess Patient leans: N/A  Psychiatric Specialty Exam: Physical Exam Vitals and nursing note reviewed.  Constitutional:      Appearance: Normal appearance.  HENT:     Head: Normocephalic.     Nose: Nose normal.  Pulmonary:     Effort: Pulmonary effort is normal.  Musculoskeletal:        General: Normal range of motion.     Cervical back: Normal range of motion.  Neurological:     General: No focal deficit present.     Mental Status: She is alert and oriented to person, place, and time.  Psychiatric:        Attention and Perception: Attention and perception normal.         Mood and Affect: Mood is not anxious. Affect is blunt.        Speech: Speech normal.        Behavior: Behavior normal. Behavior is cooperative.        Thought Content: Thought content normal.        Cognition and Memory: Cognition normal.        Judgment: Judgment normal.    Review of Systems  Psychiatric/Behavioral:  Positive for depression.   All other systems reviewed and are negative.  Blood pressure (!) 140/55, pulse 67, temperature 97.6 F (36.4 C), temperature source Oral, resp. rate 18, height _0  (1.676 m), weight 74.5 kg, SpO2 97 %.Body mass index is 26.5 kg/m.  General Appearance: Casual  Eye Contact:  Good  Speech:  Normal Rate  Volume:  Normal  Mood:  Euthymic  Affect:  Blunt  Thought Process:  Coherent and Descriptions of Associations: Intact  Orientation:  Full (Time, Place, and Person)  Thought Content:  WDL and Logical  Suicidal Thoughts:  No  Homicidal Thoughts:  No  Memory:  Immediate;   Fair Recent;   Fair Remote;   Fair  Judgement:  Fair  Insight:  Fair  Psychomotor Activity:  WDL  Concentration:  Concentration: Fair and Attention Span: Fair  Recall:  [employer] of Knowledge:  Fair  Language:  Good  Akathisia:  No  Handed:  Right  AIMS (if indicated):     Assets:  Housing Leisure Time Resilience Social Support  ADL's:  Impaired  Cognition:  WNL  Sleep:        Physical Exam: Physical Exam Vitals and nursing note reviewed.  Constitutional:      Appearance: Normal appearance.  HENT:  Head: Normocephalic.     Nose: Nose normal.  Pulmonary:     Effort: Pulmonary effort is normal.  Musculoskeletal:        General: Normal range of motion.     Cervical back: Normal range of motion.  Neurological:     General: No focal deficit present.     Mental Status: She is alert and oriented to person, place, and time.  Psychiatric:        Attention and Perception: Attention and perception normal.        Mood and Affect: Mood is not anxious. Affect  is blunt.        Speech: Speech normal.        Behavior: Behavior normal. Behavior is cooperative.        Thought Content: Thought content normal.        Cognition and Memory: Cognition normal.        Judgment: Judgment normal.   Review of Systems  Psychiatric/Behavioral:  Positive for depression.   All other systems reviewed and are negative. Blood pressure (!) 140/55, pulse 67, temperature 97.6 F (36.4 C), temperature source Oral, resp. rate 18, height _0  (1.676 m), weight 74.5 kg, SpO2 97 %. Body mass index is 26.5 kg/m.  Treatment Plan Summary: General anxiety disorder and depression: -Continue Lexapro 5 mg daily -Continue hydroxyzine 25 mg TID PRN  Agitation: -Started Ativan 0.5 mg oral or IM if hydroxyzine above is not helpful  Disposition: No evidence of imminent risk to self or others at present.   Patient does not meet criteria for psychiatric inpatient admission. Supportive therapy provided about ongoing stressors.  Waylan Boga, NP 10/04/2020 5:52 PM

## 2020-10-05 ENCOUNTER — Ambulatory Visit: Payer: Medicare HMO | Admitting: Family Medicine

## 2020-10-05 DIAGNOSIS — I10 Essential (primary) hypertension: Secondary | ICD-10-CM | POA: Diagnosis not present

## 2020-10-05 LAB — METHYLMALONIC ACID, SERUM: Methylmalonic Acid, Quantitative: 51 nmol/L (ref 0–378)

## 2020-10-05 LAB — GLUCOSE, CAPILLARY
Glucose-Capillary: 196 mg/dL — ABNORMAL HIGH (ref 70–99)
Glucose-Capillary: 206 mg/dL — ABNORMAL HIGH (ref 70–99)
Glucose-Capillary: 214 mg/dL — ABNORMAL HIGH (ref 70–99)

## 2020-10-05 MED ORDER — INSULIN DETEMIR 100 UNIT/ML ~~LOC~~ SOLN
5.0000 [IU] | Freq: Every day | SUBCUTANEOUS | Status: DC
  Administered 2020-10-05: 5 [IU] via SUBCUTANEOUS
  Filled 2020-10-05 (×2): qty 0.05

## 2020-10-05 NOTE — Progress Notes (Signed)
Patient continues to refuse to allow linens to be placed on bed. Pt states she is "just going to lay here and be mad at the world." When patient asked if she was just having a hard day, she began to tear up and stated, "yes I just want to go home." Feelings validated. Pt assured we were doing all we could to care for her.

## 2020-10-05 NOTE — Progress Notes (Signed)
Patient sitting in bed with bed linens removed. Per night shift report patient had removed all her bed linens, bagging them up and ripping apart several while stating she was leaving. Pt fully dressed. Pt refusing to allow new linens to be placed on bed. Pt agreeable to part of her AM meds.

## 2020-10-05 NOTE — Progress Notes (Signed)
PT Cancellation Note  Patient Details Name: Daisy Watkins MRN: [id number] DOB: [birth year]   Cancelled Treatment:    Reason Eval/Treat Not Completed: Other (comment) Treatment attempted, pt chart reviewed. Attempted to convince pt to participate; pt was initially agreeable to plan followed by strict pt refusal. Her lunch was sitting in front of her, untouched. When asked, she said she was hungry. She refused the hot lunch but was content eating fresh fruit when PT left. Will re-attempt at later date.  [name] PT, DPT  [name] 10/05/2020, 1:58 PM

## 2020-10-05 NOTE — Progress Notes (Addendum)
PROGRESS NOTE    Daisy Watkins  RWE:[id number] DOB: July 10, 1932 DOA: 09/30/2020 PCP: Daisy Roys, DO   Chief Complaint  Patient presents with   Altered Mental Status     Brief Narrative: 87 year old female with PMH of Chronic Dementia with behavioral disturbances, Meningioma (09/14/20 MRI), HTN, DM2, CAD, Afib/DVT (on Eliquis) who presents to the ED on 7/3 with acute confusion, agitation with intermittent crying, and difficulty taking care of herself and taking home medications.  Patient was admitted for SNF placement.  Subjective: Daisy Watkins is doing much better today. There are no reports of agitation.  She received Ativan 0.5 mg overnight. She is OOB throughout the day. She ate breakfast without any issue. Neurological exam is stable.  There are no acute deficits.  There have been no reported seizure like activity. Denies any dysuria or urinary symptoms.  I discussed plan of care with Case Management in the room.  We are applying for Medicaid and guardianship with the state.  Assessment & Plan: Principal Problem:   Acute metabolic encephalopathy Active Problems:   Type 2 diabetes mellitus with hyperglycemia (HCC)   Coronary artery disease   Benign essential hypertension   Paroxysmal A-fib (HCC)   Anxiety, generalized   History of DVT (deep vein thrombosis)   Psychosis in elderly with behavioral disturbance (HCC)   AMS (altered mental status)   Meningioma (HCC)   Hypokalemia  Chronic Dementia with Behavioral Disturbances: Patient has had no episodes of agitation reported.  She has no SI/HI. - Continue Lexapro at 10 mg daily.  Increase to 20 mg daily after one week on 7/14. - Continue Depakote at 500 mg daily.  Increase to 750 mg daily after 3 days on 7/12.  Increase to 1000 mg daily on 7/15. - Continue Hydroxyzine 25 mg TID PRN for anxiety.  This can be increased to 50 mg if needed. - Continue Ativan 0.5 mg PRN for agitation.  This will be tapered off after one week once  other psychiatric medications are up-titrated.  Left Anterior Lobe Meningioma: This may be contributing to her fluctuating mental status. - Continue Depakote 500 mg daily.  Increase to 750 - 1000 mg as outlined above to achieve therapeutic dose for seizure control. - Patient was evaluated by Neurosurgery on 09/14/20 admission and was deemed to be poor surgical candidate. - She will follow up with Rad/Onc outpatient to consider radiotherapy.  Paroxysmal Atrial Fibrillation: - Her HR runs low - hold off on a rate controlling agent. - Continue Eliquis 5 mg BID for anticoagulation.  History of DVT, unprovoked: - Continue Eliquis for anticoagulation.  Essential Hypertension: BP is stable today. - Continue Hydralazine 50 mg TID. - Continue ACE.  Diabetes Mellitus Type 2: - Start Levemir 5 units nightly. - Continue Aspart SSi with POC glucose ACHS.  Coronary Artery Disease: - Continue Eliquis.   - She is out of age range for Statin.  And patient does not tolerate side effects.  Lactobacillus Urine Cuture: This is a normal flora of the female GU tract / contaminant.   Diet Order             Diet Carb Modified Fluid consistency: Thin; Room service appropriate? Yes with Assist  Diet effective now                   Nutrition Problem: Inadequate oral intake Etiology: poor appetite Signs/Symptoms: per patient/family report, meal completion < 50% Interventions: Liberalize Diet, MVI, Glucerna shake Patient's Body  mass index is 26.5 kg/m.      DVT prophylaxis:  Code Status:   Code Status: Full Code  Family Communication: plan of care discussed with patient at bedside.  Status is: Inpatient  Remains inpatient appropriate because:Inpatient level of care appropriate due to severity of illness  Dispo: The patient is from: ALF -              Anticipated d/c is to: SNF - Unable to discharge to [place].  Family is unavailable.  Application was sent for Medicaid guardianship with  the state.  Placement will be a process.              Patient currently is medically stable to d/c.   Difficult to place patient Yes   Unresulted Labs (From admission, onward)     Start     Ordered   10/02/20 0825  Vitamin B1  Once,   R       Question:  Specimen collection method  Answer:  Lab=Lab collect   10/02/20 0825             Medications reviewed:  Scheduled Meds:  apixaban  5 mg Oral BID   divalproex  500 mg Oral Daily   escitalopram  10 mg Oral Daily   feeding supplement (GLUCERNA SHAKE)  237 mL Oral TID BM   hydrALAZINE  50 mg Oral Q8H   insulin aspart  0-15 Units Subcutaneous TID WC   insulin aspart  0-5 Units Subcutaneous QHS   multivitamin with minerals  1 tablet Oral Daily   ramipril  5 mg Oral Daily   thiamine injection  100 mg Intravenous Daily   vitamin B-12  1,000 mcg Oral Daily   Continuous Infusions:  Consultants:see note  Procedures:see note  Antimicrobials: Anti-infectives (From admission, onward)    None      Culture/Microbiology    Component Value Date/Time   SDES  09/30/2020 1956    URINE, RANDOM Performed at [facility], [address], [place], [place] [address]    [facility]  09/30/2020 1956    NONE Performed at [facility], [address] [place], [place] [address]    CULT (A) 09/30/2020 1956    >=100,000 COLONIES/mL LACTOBACILLUS SPECIES Standardized susceptibility testing for this organism is not available. Performed at [facility], [place] [address] [place], [place] [address]    REPTSTATUS 10/02/2020 FINAL 09/30/2020 1956    Other culture-see note  Objective: Vitals: Today's Vitals   10/04/20 2300 10/05/20 0544 10/05/20 0951 10/05/20 1132  BP: (!) 137/54 (!) 144/67 127/60 (!) 115/46  Pulse: 65 62 84 70  Resp: 16 18 16 17   Temp: 97.7 F (36.5 C) 97.9 F (36.6 C) 97.6 F (36.4 C) 97.7 F (36.5 C)  TempSrc:      SpO2: 97% 99% 99% 98%  Weight:      Height:      PainSc:   0-No pain      Intake/Output Summary (Last 24 hours) at 10/05/2020 1410 Last data filed at 10/04/2020 1850 Gross per 24 hour  Intake 0 ml  Output --  Net 0 ml    Filed Weights   09/30/20 1031  Weight: 74.5 kg   Weight change:   Intake/Output from previous day: No intake/output data recorded. Intake/Output this shift: No intake/output data recorded. Filed Weights   09/30/20 1031  Weight: 74.5 kg    Examination: General exam: alert and oriented, mental status intact, mood is stable, physically  deconditioned HEENT: NCAT, PERRL Respiratory system: CTAB no WRR Cardiovascular system: Did not appreciate a murmur, regular, No JVD. Gastrointestinal system: No flank pain, Abdomen soft, NT,ND, BS+. Nervous System: No focal deficits. Extremities: No edema, distal peripheral pulses palpable.  Skin: No rashes, No bruises, No icterus. MSK: Physical Deconditioning   Data Reviewed: I have personally reviewed following labs and imaging studies CBC: Recent Labs  Lab 09/30/20 1033 10/02/20 0504 10/03/20 0647  WBC 6.0 4.4 4.2  NEUTROABS 4.5 2.2 2.1  HGB 14.8 12.7 13.3  HCT 44.0 37.0 39.0  MCV 85.6 84.3 85.7  PLT 216 195 315    Basic Metabolic Panel: Recent Labs  Lab 09/30/20 1033 10/01/20 0659 10/02/20 0504 10/03/20 0647  NA 138 140 140 139  K 3.2* 3.1* 3.2* 3.9  CL 95* 102 102 103  CO2 29 27 29 26   GLUCOSE 343* 178* 123* 175*  BUN 13 10 10 12   CREATININE 0.63 0.61 0.50 0.53  CALCIUM 10.0 9.2 9.2 9.5  MG 1.9  --  1.7 1.8  PHOS  --   --  2.7 3.3    GFR: Estimated Creatinine Clearance: 52.1 mL/min (by C-G formula based on SCr of 0.53 mg/dL). Liver Function Tests: Recent Labs  Lab 09/30/20 1033 10/02/20 0504 10/03/20 0647  AST 34 25 22  ALT 28 25 24   ALKPHOS 100 76 86  BILITOT 1.9* 1.6* 1.5*  PROT 7.1 5.7* 5.9*  ALBUMIN 4.0 3.3* 3.2*     Recent Labs  Lab 10/01/20 1656  AMMONIA 28     CBG: Recent Labs  Lab 10/04/20 1157 10/04/20 1617 10/04/20 2100  10/05/20 0947 10/05/20 1154  GLUCAP 133* 169* 162* 196* 206*      Recent Results (from the past 240 hour(s))  Resp Panel by RT-PCR (Flu A&B, Covid) Nasopharyngeal Swab     Status: None   Collection Time: 09/30/20 10:38 AM   Specimen: Nasopharyngeal Swab; Nasopharyngeal(NP) swabs in vial transport medium  Result Value Ref Range Status   SARS Coronavirus 2 by RT PCR NEGATIVE NEGATIVE Final    Comment: (NOTE) SARS-CoV-2 target nucleic acids are NOT DETECTED.  The SARS-CoV-2 RNA is generally detectable in upper respiratory specimens during the acute phase of infection. The lowest concentration of SARS-CoV-2 viral copies this assay can detect is 138 copies/mL. A negative result does not preclude SARS-Cov-2 infection and should not be used as the sole basis for treatment or other patient management decisions. A negative result may occur with  improper specimen collection/handling, submission of specimen other than nasopharyngeal swab, presence of viral mutation(s) within the areas targeted by this assay, and inadequate number of viral copies(<138 copies/mL). A negative result must be combined with clinical observations, patient history, and epidemiological information. The expected result is Negative.  Fact Sheet for Patients:  EntrepreneurPulse.com.au  Fact Sheet for Healthcare Providers:  IncredibleEmployment.be  This test is no t yet approved or cleared by the [place] FDA and  has been authorized for detection and/or diagnosis of SARS-CoV-2 by FDA under an Emergency Use Authorization (EUA). This EUA will remain  in effect (meaning this test can be used) for the duration of the COVID-19 declaration under Section 564(b)(1) of the Act, 21 U.S.C.section 360bbb-3(b)(1), unless the authorization is terminated  or revoked sooner.       Influenza A by PCR NEGATIVE NEGATIVE Final   Influenza B by PCR NEGATIVE NEGATIVE Final    Comment:  (NOTE) The Xpert Xpress SARS-CoV-2/FLU/RSV plus assay is intended as an aid  in the diagnosis of influenza from Nasopharyngeal swab specimens and should not be used as a sole basis for treatment. Nasal washings and aspirates are unacceptable for Xpert Xpress SARS-CoV-2/FLU/RSV testing.  Fact Sheet for Patients: EntrepreneurPulse.com.au  Fact Sheet for Healthcare Providers: IncredibleEmployment.be  This test is not yet approved or cleared by the [place] FDA and has been authorized for detection and/or diagnosis of SARS-CoV-2 by FDA under an Emergency Use Authorization (EUA). This EUA will remain in effect (meaning this test can be used) for the duration of the COVID-19 declaration under Section 564(b)(1) of the Act, 21 U.S.C. section 360bbb-3(b)(1), unless the authorization is terminated or revoked.  Performed at [facility], [address], [place], [place] [address]   Urine Culture     Status: Abnormal   Collection Time: 09/30/20  7:56 PM   Specimen: Urine, Random  Result Value Ref Range Status   Specimen Description   Final    URINE, RANDOM Performed at [facility], [address], [place], [place] [address]    Special Requests   Final    NONE Performed at [facility], [address] [place], [place] [address]    Culture (A)  Final    >=100,000 COLONIES/mL LACTOBACILLUS SPECIES Standardized susceptibility testing for this organism is not available. Performed at [facility], [place] [address], [place], [place] [address]    Report Status 10/02/2020 FINAL  Final      Radiology Studies: No results found.   LOS: 4 days   George Hugh, MD [facility]  10/05/2020, 2:10 PM

## 2020-10-06 DIAGNOSIS — G9341 Metabolic encephalopathy: Secondary | ICD-10-CM | POA: Diagnosis not present

## 2020-10-06 DIAGNOSIS — I10 Essential (primary) hypertension: Secondary | ICD-10-CM | POA: Diagnosis not present

## 2020-10-06 DIAGNOSIS — F411 Generalized anxiety disorder: Secondary | ICD-10-CM | POA: Diagnosis not present

## 2020-10-06 LAB — GLUCOSE, CAPILLARY
Glucose-Capillary: 242 mg/dL — ABNORMAL HIGH (ref 70–99)
Glucose-Capillary: 209 mg/dL — ABNORMAL HIGH (ref 70–99)
Glucose-Capillary: 174 mg/dL — ABNORMAL HIGH (ref 70–99)
Glucose-Capillary: 258 mg/dL — ABNORMAL HIGH (ref 70–99)

## 2020-10-06 MED ORDER — ESCITALOPRAM OXALATE 20 MG PO TABS
20.0000 mg | ORAL_TABLET | Freq: Every day | ORAL | Status: DC
  Administered 2020-10-07 – 2020-11-20 (×45): 20 mg via ORAL
  Filled 2020-10-06 (×45): qty 1

## 2020-10-06 MED ORDER — DIVALPROEX SODIUM ER 500 MG PO TB24
750.0000 mg | ORAL_TABLET | Freq: Every day | ORAL | Status: DC
  Administered 2020-10-07 – 2020-10-08 (×2): 750 mg via ORAL
  Filled 2020-10-06 (×2): qty 1

## 2020-10-06 MED ORDER — INSULIN DETEMIR 100 UNIT/ML ~~LOC~~ SOLN
8.0000 [IU] | Freq: Every day | SUBCUTANEOUS | Status: DC
  Administered 2020-10-06: 8 [IU] via SUBCUTANEOUS
  Filled 2020-10-06 (×3): qty 0.08

## 2020-10-06 MED ORDER — INSULIN DETEMIR 100 UNIT/ML ~~LOC~~ SOLN
10.0000 [IU] | Freq: Every day | SUBCUTANEOUS | Status: DC
  Filled 2020-10-06: qty 0.1

## 2020-10-06 NOTE — Progress Notes (Addendum)
PROGRESS NOTE    Daisy Watkins  BSW:[id number] DOB: June 13, 1932 DOA: 09/30/2020 PCP: Valerie Roys, DO   Chief Complaint  Patient presents with   Altered Mental Status     Brief Narrative: 87 year old female with PMH of Chronic Dementia with behavioral disturbances, Meningioma (09/14/20 MRI), HTN, DM2, CAD, Afib/DVT (on Eliquis) who presents to the ED on 7/3 with acute confusion, agitation with intermittent crying, and difficulty taking care of herself and taking home medications.  Patient was admitted for SNF placement.  Subjective: Daisy Watkins refused her medications today and refuses to speak. She is normally eating well and OOB throughout the day. I will consult psychiatry. Neurological exam is stable.  There are no acute deficits.  There have been no reported seizure like activity. She denies any dysuria or urinary symptoms.  Assessment & Plan: Principal Problem:   Acute metabolic encephalopathy Active Problems:   Type 2 diabetes mellitus with hyperglycemia (HCC)   Coronary artery disease   Benign essential hypertension   Paroxysmal A-fib (HCC)   Anxiety, generalized   History of DVT (deep vein thrombosis)   Psychosis in elderly with behavioral disturbance (HCC)   AMS (altered mental status)   Meningioma (HCC)   Hypokalemia  Chronic Dementia with Behavioral Disturbances: Patient has had no episodes of agitation reported.  She has had no SI/HI. - Given acute change in mood, I will consult Tele-Psychiatry. - In the meantime, increase Lexapro to 20 mg daily.   - Increase Depakote to 750 mg daily.  Increase to 1000 mg daily after 3 days.. - Continue Hydroxyzine 25 mg TID PRN for anxiety.  This can be increased to 50 mg if needed. - Continue Ativan 0.5 mg PRN for agitation.  Taper off once more stable.  Left Anterior Lobe Meningioma: This may be contributing to her fluctuating mental status. - Continue Depakote 500 mg daily.  Increase to 750 - 1000 mg as outlined above to  achieve therapeutic dose for seizure control. - Patient was evaluated by Neurosurgery on 09/14/20 admission and was deemed to be a poor surgical candidate. - She will follow up with Rad/Onc outpatient to consider radiotherapy.  Paroxysmal Atrial Fibrillation: - Her HR runs low - hold off on a rate controlling agent. - Continue Eliquis 5 mg BID for anticoagulation.  History of DVT, unprovoked: - Continue Eliquis 5 mg BID for anticoagulation.  Essential Hypertension: BP is stable today. - Continue Hydralazine 50 mg TID. - Continue ACE.  Diabetes Mellitus Type 2: - Increase Levemir from 5 to 8 units nightly. - Continue Aspart SSi with POC glucose ACHS.  Coronary Artery Disease: - Continue Eliquis.   - She is out of age range for Statin.  And patient does not tolerate side effects.  Lactobacillus Urine Cuture: This is a normal flora of the female GU tract / contaminant.   Diet Order             Diet Carb Modified Fluid consistency: Thin; Room service appropriate? Yes with Assist  Diet effective now                   Nutrition Problem: Inadequate oral intake Etiology: poor appetite Signs/Symptoms: per patient/family report, meal completion < 50% Interventions: Liberalize Diet, MVI, Glucerna shake Patient's Body mass index is 26.5 kg/m.      DVT prophylaxis:  Code Status:   Code Status: Full Code  Family Communication: plan of care discussed with patient at bedside.  Status is: Inpatient  Remains inpatient appropriate because:Inpatient level of care appropriate due to severity of illness  Dispo: The patient is from: ALF -              Anticipated d/c is to: SNF - Unable to discharge to [place].  Family is unavailable.  Application was sent for Medicaid guardianship with the state.  Placement will be a process.              Patient currently is medically stable to d/c.   Difficult to place patient Yes   Unresulted Labs (From admission, onward)     Start      Ordered   10/02/20 0825  Vitamin B1  Once,   R       Question:  Specimen collection method  Answer:  Lab=Lab collect   10/02/20 0825             Medications reviewed:  Scheduled Meds:  apixaban  5 mg Oral BID   divalproex  500 mg Oral Daily   escitalopram  10 mg Oral Daily   feeding supplement (GLUCERNA SHAKE)  237 mL Oral TID BM   hydrALAZINE  50 mg Oral Q8H   insulin aspart  0-15 Units Subcutaneous TID WC   insulin aspart  0-5 Units Subcutaneous QHS   insulin detemir  10 Units Subcutaneous QHS   multivitamin with minerals  1 tablet Oral Daily   ramipril  5 mg Oral Daily   thiamine injection  100 mg Intravenous Daily   vitamin B-12  1,000 mcg Oral Daily   Continuous Infusions:  Consultants:see note  Procedures:see note  Antimicrobials: Anti-infectives (From admission, onward)    None      Culture/Microbiology    Component Value Date/Time   SDES  09/30/2020 1956    URINE, RANDOM Performed at [facility], [address], [place], [place] [address]    [facility]  09/30/2020 1956    NONE Performed at [facility], [address] [place], [place] [address]    CULT (A) 09/30/2020 1956    >=100,000 COLONIES/mL LACTOBACILLUS SPECIES Standardized susceptibility testing for this organism is not available. Performed at [facility], [place] [address] [place], [place] [address]    REPTSTATUS 10/02/2020 FINAL 09/30/2020 1956    Other culture-see note  Objective: Vitals: Today's Vitals   10/05/20 2022 10/06/20 0000 10/06/20 0623 10/06/20 0844  BP: (!) 146/63  (!) 163/64 119/66  Pulse: 70  75 (!) 101  Resp: 16  16 17   Temp: 98.2 F (36.8 C)  97.8 F (36.6 C) 98.2 F (36.8 C)  TempSrc:      SpO2: 94%  97% 97%  Weight:      Height:      PainSc:  0-No pain      Intake/Output Summary (Last 24 hours) at 10/06/2020 1437 Last data filed at 10/06/2020 1040 Gross per 24 hour  Intake 240 ml  Output --  Net 240 ml    Filed Weights    09/30/20 1031  Weight: 74.5 kg   Weight change:   Intake/Output from previous day: 07/08 0701 - 07/09 0700 In: 600 [P.O.:600] Out: -  Intake/Output this shift: No intake/output data recorded. Filed Weights   09/30/20 1031  Weight: 74.5 kg    Examination: General exam: alert and oriented, mental status intact, mood is stable HEENT: NCAT, PERRL Respiratory system: CTAB no WRR Cardiovascular system: Did not appreciate a murmur, regular, No JVD. Gastrointestinal system: No flank pain, Abdomen soft,  NT,ND, BS+. Nervous System: No focal deficits. Extremities: No edema, distal peripheral pulses palpable.  Skin: No rashes, No bruises, No icterus. MSK: Physical Deconditioning   Data Reviewed: I have personally reviewed following labs and imaging studies CBC: Recent Labs  Lab 09/30/20 1033 10/02/20 0504 10/03/20 0647  WBC 6.0 4.4 4.2  NEUTROABS 4.5 2.2 2.1  HGB 14.8 12.7 13.3  HCT 44.0 37.0 39.0  MCV 85.6 84.3 85.7  PLT 216 195 185    Basic Metabolic Panel: Recent Labs  Lab 09/30/20 1033 10/01/20 0659 10/02/20 0504 10/03/20 0647  NA 138 140 140 139  K 3.2* 3.1* 3.2* 3.9  CL 95* 102 102 103  CO2 29 27 29 26   GLUCOSE 343* 178* 123* 175*  BUN 13 10 10 12   CREATININE 0.63 0.61 0.50 0.53  CALCIUM 10.0 9.2 9.2 9.5  MG 1.9  --  1.7 1.8  PHOS  --   --  2.7 3.3    GFR: Estimated Creatinine Clearance: 52.1 mL/min (by C-G formula based on SCr of 0.53 mg/dL). Liver Function Tests: Recent Labs  Lab 09/30/20 1033 10/02/20 0504 10/03/20 0647  AST 34 25 22  ALT 28 25 24   ALKPHOS 100 76 86  BILITOT 1.9* 1.6* 1.5*  PROT 7.1 5.7* 5.9*  ALBUMIN 4.0 3.3* 3.2*     Recent Labs  Lab 10/01/20 1656  AMMONIA 28     CBG: Recent Labs  Lab 10/05/20 0947 10/05/20 1154 10/05/20 2126 10/06/20 0856 10/06/20 1139  GLUCAP 196* 206* 214* 242* 209*      Recent Results (from the past 240 hour(s))  Resp Panel by RT-PCR (Flu A&B, Covid) Nasopharyngeal Swab     Status:  None   Collection Time: 09/30/20 10:38 AM   Specimen: Nasopharyngeal Swab; Nasopharyngeal(NP) swabs in vial transport medium  Result Value Ref Range Status   SARS Coronavirus 2 by RT PCR NEGATIVE NEGATIVE Final    Comment: (NOTE) SARS-CoV-2 target nucleic acids are NOT DETECTED.  The SARS-CoV-2 RNA is generally detectable in upper respiratory specimens during the acute phase of infection. The lowest concentration of SARS-CoV-2 viral copies this assay can detect is 138 copies/mL. A negative result does not preclude SARS-Cov-2 infection and should not be used as the sole basis for treatment or other patient management decisions. A negative result may occur with  improper specimen collection/handling, submission of specimen other than nasopharyngeal swab, presence of viral mutation(s) within the areas targeted by this assay, and inadequate number of viral copies(<138 copies/mL). A negative result must be combined with clinical observations, patient history, and epidemiological information. The expected result is Negative.  Fact Sheet for Patients:  EntrepreneurPulse.com.au  Fact Sheet for Healthcare Providers:  IncredibleEmployment.be  This test is no t yet approved or cleared by the [place] FDA and  has been authorized for detection and/or diagnosis of SARS-CoV-2 by FDA under an Emergency Use Authorization (EUA). This EUA will remain  in effect (meaning this test can be used) for the duration of the COVID-19 declaration under Section 564(b)(1) of the Act, 21 U.S.C.section 360bbb-3(b)(1), unless the authorization is terminated  or revoked sooner.       Influenza A by PCR NEGATIVE NEGATIVE Final   Influenza B by PCR NEGATIVE NEGATIVE Final    Comment: (NOTE) The Xpert Xpress SARS-CoV-2/FLU/RSV plus assay is intended as an aid in the diagnosis of influenza from Nasopharyngeal swab specimens and should not be used as a sole basis for  treatment. Nasal washings and aspirates are unacceptable  for Xpert Xpress SARS-CoV-2/FLU/RSV testing.  Fact Sheet for Patients: EntrepreneurPulse.com.au  Fact Sheet for Healthcare Providers: IncredibleEmployment.be  This test is not yet approved or cleared by the [place] FDA and has been authorized for detection and/or diagnosis of SARS-CoV-2 by FDA under an Emergency Use Authorization (EUA). This EUA will remain in effect (meaning this test can be used) for the duration of the COVID-19 declaration under Section 564(b)(1) of the Act, 21 U.S.C. section 360bbb-3(b)(1), unless the authorization is terminated or revoked.  Performed at [facility], [address], [place], [place] [address]   Urine Culture     Status: Abnormal   Collection Time: 09/30/20  7:56 PM   Specimen: Urine, Random  Result Value Ref Range Status   Specimen Description   Final    URINE, RANDOM Performed at [facility], [address], [place], [place] [address]    Special Requests   Final    NONE Performed at [facility], [address] [place], [place] [address]    Culture (A)  Final    >=100,000 COLONIES/mL LACTOBACILLUS SPECIES Standardized susceptibility testing for this organism is not available. Performed at [facility], [place] [address], [place],  [address]    Report Status 10/02/2020 FINAL  Final      Radiology Studies: No results found.   LOS: 5 days   George Hugh, MD [facility]  10/06/2020, 2:37 PM

## 2020-10-06 NOTE — Consult Note (Addendum)
[facility] Face-to-Face Psychiatry Consult   Reason for Consult:  anxiety, depression; poor self care Referring Physician:  Dr Juel Burrow Patient Identification: ESRA FRANKOWSKI MRN:  [id number] Principal Diagnosis: Acute metabolic encephalopathy Diagnosis:  Principal Problem:   Acute metabolic encephalopathy Active Problems:   Anxiety, generalized   Type 2 diabetes mellitus with hyperglycemia (HCC)   Coronary artery disease   Benign essential hypertension   Paroxysmal A-fib (HCC)   History of DVT (deep vein thrombosis)   Psychosis in elderly with behavioral disturbance (HCC)   AMS (altered mental status)   Meningioma (HCC)   Hypokalemia   Total Time spent with patient: 45 minutes  Subjective:   JIMMY STIPES is a 87 y.o. female patient admitted with metabolic encephalopathy and UTI.  10/06/20: 87 y/o female with meningioma and anxiety and agitation at times. She was living at home with her duaghters, however they are can no longer care for her. Patient will need SNF placement. When this provider met with her, she appears withdrawn, cooperative, dressed. She is alert and oriented to place, person and time. She is answering questions appropriately and is able to follow the conversation. She denies any Suicidal/homicidal ideation. Denies depression and anxiety. She does not want to be in the hospital and wants to go home. She reports her sleep is "good", appetite is "ok". There is no change noted today since her last assessment by me on 10/04/2020. She will benefit from follow up with neurology given her meningioma. She is cleared from a psychiatric standpoint  10/04/20 87 yo female with meningioma and anxiety and agitation at times.  She is medically and psychiatrically cleared but her daughters are not picking her up as they can no longer care for her.  SNF or assisted living is being pursued.  When this provider met with her, she is dressed and calmly lying on her bed watching television.  She is able to  carry on a conversation without issues.  Denies pain, suicidal/homicidal ideations, depression, and anxiety.  She states, "I want to go home."  Anxiety and agitation medications in place.  10/01/20: 87 yo female with depression and anxiety, meningioma and other health issues.  Her family was concerned about her lack of self-care.  On assessment, she is clear and coherent.  She stated, "I'm good, how are you?"  When asked what brought her to the ED, "I got sick."  Denies any discomforts today.  When asked about her depression/mood, she reported, "Everybody has depression, it's called stress."  She reports her appetite is "good" along with sleep.  No suicidal/homicidal ideations or hallucinations.  She continues to report "lorazepam" helps her, this was discontinued months ago.  This provider recommends a SSRI and prn hydroxyzine to better assist her depression and anxiety, see treatment plan.  She does not meet criteria for psychiatric admission.  Per MD, Dr Clyde Lundborg: Per her daughter (I called her daughter by phone), since this morning patient has been more confused, agitated, screaming around.  States that the patient did not fall, just slide down from commode last night.  No significant injury.  Daughter states that patient did not pass out, but per EDP, "Patient was unresponsive when EMS got there but then started waking up and was able to follow commands but was nonverbal". When I saw pt in ED, she is confused, but knows her own name, knows that she is in hospital.  She is confused about time.  She is emotional and crying.  No hallucination.  Denies suicidal or homicidal ideations.  She moves all extremities normally.  No facial droop or slurred speech.  She has mild dry cough, denies chest pain, shortness of breath.  Patient states that sometimes that she had diarrhea recently, but no diarrhea today.  Denies nausea, vomiting or abdominal pain.  No symptoms of UTI.  Per ED physician, "there is also concern from  family that she has not been taking her medication and not taking care of herself therefore IVC was placed".  HPI:  87 yo female with depression and anxiety  Past Psychiatric History: depression, anxiety  Risk to Self:  none Risk to Others:  none Prior Inpatient Therapy:  none Prior Outpatient Therapy:  none  Past Medical History:  Past Medical History:  Diagnosis Date   Anxiety    Constipation 08/17/2015   Diabetes mellitus without complication ([place])    DVT (deep venous thrombosis) ([place]) 2006   chronic anticoagulation   Encounter for monitoring Coumadin therapy 12/16/2016   GERD (gastroesophageal reflux disease)    Hyperlipidemia    Hypertension    Insomnia    Monitoring for anticoagulant use 09/21/2014   Goal INR 2-3; recurrent DVTs, paroxysmal atrial fibrillation.  Unable to take Xaralto   Myalgia 02/23/2017   Sebaceous cyst 06/11/2015   Skin lesion of back 09/05/2016   Trapezius muscle spasm 05/22/2017   Urinary frequency 11/12/2016   Vitamin B12 deficiency    Vitamin D deficiency disease     Past Surgical History:  Procedure Laterality Date   ABDOMINAL HYSTERECTOMY     Family History:  Family History  Problem Relation Age of Onset   Diabetes Maternal Grandmother    Hypertension Mother    Cancer Daughter        breast   Heart disease Son    Heart disease Son    Family Psychiatric  History: none Social History:  Social History   Substance and Sexual Activity  Alcohol Use No   Alcohol/week: 0.0 standard drinks   Comment: rare     Social History   Substance and Sexual Activity  Drug Use No    Social History   Socioeconomic History   Marital status: Divorced    Spouse name: Not on file   Number of children: Not on file   Years of education: Not on file   Highest education level: GED or equivalent  Occupational History   Not on file  Tobacco Use   Smoking status: Former    Packs/day: 0.50    Years: 30.00    Pack years: 15.00    Types: Cigarettes     Quit date: 03/31/1977    Years since quitting: 43.5   Smokeless tobacco: Never  Vaping Use   Vaping Use: Never used  Substance and Sexual Activity   Alcohol use: No    Alcohol/week: 0.0 standard drinks    Comment: rare   Drug use: No   Sexual activity: Not Currently  Other Topics Concern   Not on file  Social History Narrative   Not on file   Social Determinants of Health   Financial Resource Strain: Medium Risk   Difficulty of Paying Living Expenses: Somewhat hard  Food Insecurity: No Food Insecurity   Worried About Charity fundraiser in the Last Year: Never true   Ran Out of Food in the Last Year: Never true  Transportation Needs: No Transportation Needs   Lack of Transportation (Medical): No   Lack of Transportation (Non-Medical): No  Physical  Activity: Insufficiently Active   Days of Exercise per Week: 2 days   Minutes of Exercise per Session: 20 min  Stress: Stress Concern Present   Feeling of Stress : To some extent  Social Connections: Not on file   Additional Social History:    Allergies:   Allergies  Allergen Reactions   Ciprofloxacin Palpitations    Severe Headache   Clindamycin/Lincomycin Nausea Only    Made her head hurt   Elemental Sulfur Itching   Hctz [Hydrochlorothiazide] Other (See Comments)    Abdominal pain   Doxycycline Hyclate Palpitations    Labs:  Results for orders placed or performed during the hospital encounter of 09/30/20 (from the past 48 hour(s))  Glucose, capillary     Status: Abnormal   Collection Time: 10/04/20  9:00 PM  Result Value Ref Range   Glucose-Capillary 162 (H) 70 - 99 mg/dL    Comment: Glucose reference range applies only to samples taken after fasting for at least 8 hours.  Glucose, capillary     Status: Abnormal   Collection Time: 10/05/20  9:47 AM  Result Value Ref Range   Glucose-Capillary 196 (H) 70 - 99 mg/dL    Comment: Glucose reference range applies only to samples taken after fasting for at least 8 hours.   Glucose, capillary     Status: Abnormal   Collection Time: 10/05/20 11:54 AM  Result Value Ref Range   Glucose-Capillary 206 (H) 70 - 99 mg/dL    Comment: Glucose reference range applies only to samples taken after fasting for at least 8 hours.  Glucose, capillary     Status: Abnormal   Collection Time: 10/05/20  9:26 PM  Result Value Ref Range   Glucose-Capillary 214 (H) 70 - 99 mg/dL    Comment: Glucose reference range applies only to samples taken after fasting for at least 8 hours.  Glucose, capillary     Status: Abnormal   Collection Time: 10/06/20  8:56 AM  Result Value Ref Range   Glucose-Capillary 242 (H) 70 - 99 mg/dL    Comment: Glucose reference range applies only to samples taken after fasting for at least 8 hours.  Glucose, capillary     Status: Abnormal   Collection Time: 10/06/20 11:39 AM  Result Value Ref Range   Glucose-Capillary 209 (H) 70 - 99 mg/dL    Comment: Glucose reference range applies only to samples taken after fasting for at least 8 hours.    Current Facility-Administered Medications  Medication Dose Route Frequency Provider Last Rate Last Admin   acetaminophen (TYLENOL) suppository 650 mg  650 mg Rectal Q6H PRN Ivor Costa, MD       acetaminophen (TYLENOL) tablet 650 mg  650 mg Oral Q6H PRN Ivor Costa, MD       apixaban Arne Cleveland) tablet 5 mg  5 mg Oral BID Ivor Costa, MD   5 mg at 10/06/20 0928   [START ON 10/07/2020] divalproex (DEPAKOTE ER) 24 hr tablet 750 mg  750 mg Oral Daily George Hugh, MD       Derrill Memo ON 10/07/2020] escitalopram (LEXAPRO) tablet 20 mg  20 mg Oral Daily Masoud, Jarrett Soho, MD       feeding supplement (GLUCERNA SHAKE) ([place]) liquid 237 mL  237 mL Oral TID BM Elodia Florence., MD   237 mL at 10/05/20 2212   hydrALAZINE (APRESOLINE) injection 5 mg  5 mg Intravenous Q2H PRN Ivor Costa, MD   5 mg at 10/04/20 0750   hydrALAZINE (  APRESOLINE) tablet 50 mg  50 mg Oral Q8H Masoud, Dahlia Client, MD   50 mg at 10/06/20 1521    hydrOXYzine (ATARAX/VISTARIL) tablet 25 mg  25 mg Oral TID PRN Charm Rings, NP   25 mg at 10/04/20 2119   insulin aspart (novoLOG) injection 0-15 Units  0-15 Units Subcutaneous TID WC Lorretta Harp, MD   5 Units at 10/06/20 1241   insulin aspart (novoLOG) injection 0-5 Units  0-5 Units Subcutaneous QHS Lorretta Harp, MD   2 Units at 10/05/20 2211   insulin detemir (LEVEMIR) injection 8 Units  8 Units Subcutaneous QHS Baldwin Jamaica, MD       LORazepam (ATIVAN) tablet 0.5 mg  0.5 mg Oral Daily PRN Charm Rings, NP   0.5 mg at 10/06/20 1521   multivitamin with minerals tablet 1 tablet  1 tablet Oral Daily Zigmund Daniel., MD   1 tablet at 10/06/20 0928   ondansetron (ZOFRAN) injection 4 mg  4 mg Intravenous Q8H PRN Lorretta Harp, MD       ramipril (ALTACE) capsule 5 mg  5 mg Oral Daily Lorretta Harp, MD   5 mg at 10/06/20 1065   thiamine (B-1) injection 100 mg  100 mg Intravenous Daily Bhagat, Srishti L, MD   100 mg at 10/03/20 8395   vitamin B-12 (CYANOCOBALAMIN) tablet 1,000 mcg  1,000 mcg Oral Daily Zigmund Daniel., MD   1,000 mcg at 10/06/20 5103    Musculoskeletal: Strength & Muscle Tone: decreased Gait & Station:  did not assess Patient leans: N/A  Psychiatric Specialty Exam: Physical Exam Vitals and nursing note reviewed.  Constitutional:      Appearance: Normal appearance.  HENT:     Head: Normocephalic.     Nose: Nose normal.  Pulmonary:     Effort: Pulmonary effort is normal.  Musculoskeletal:        General: Normal range of motion.     Cervical back: Normal range of motion.  Neurological:     General: No focal deficit present.     Mental Status: She is alert and oriented to person, place, and time.  Psychiatric:        Attention and Perception: Attention and perception normal.        Mood and Affect: Mood is not anxious. Affect is blunt.        Speech: Speech normal.        Behavior: Behavior normal. Behavior is cooperative.        Thought Content: Thought  content normal.        Cognition and Memory: Cognition normal.        Judgment: Judgment normal.    Review of Systems  Psychiatric/Behavioral:  Positive for depression.   All other systems reviewed and are negative.  Blood pressure 112/68, pulse 99, temperature 98.5 F (36.9 C), temperature source Oral, resp. rate 19, height 5\' 6"  (1.676 m), weight 74.5 kg, SpO2 98 %.Body mass index is 26.5 kg/m.  General Appearance: Casual  Eye Contact:  Good  Speech:  Normal Rate  Volume:  Normal  Mood:  Euthymic  Affect:  Blunt  Thought Process:  Coherent and Descriptions of Associations: Intact  Orientation:  Full (Time, Place, and Person)  Thought Content:  WDL and Logical  Suicidal Thoughts:  No  Homicidal Thoughts:  No  Memory:  Immediate;   Fair Recent;   Fair Remote;   Fair  Judgement:  Fair  Insight:  Fair  Psychomotor Activity:  WDL  Concentration:  Concentration: Fair and Attention Span: Fair  Recall:  [employer] of Knowledge:  Fair  Language:  Good  Akathisia:  No  Handed:  Right  AIMS (if indicated):     Assets:  Housing Leisure Time Resilience Social Support  ADL's:  Impaired  Cognition:  WNL  Sleep:        Physical Exam: Physical Exam Vitals and nursing note reviewed.  Constitutional:      Appearance: Normal appearance.  HENT:     Head: Normocephalic.     Nose: Nose normal.  Pulmonary:     Effort: Pulmonary effort is normal.  Musculoskeletal:        General: Normal range of motion.     Cervical back: Normal range of motion.  Neurological:     General: No focal deficit present.     Mental Status: She is alert and oriented to person, place, and time.  Psychiatric:        Attention and Perception: Attention and perception normal.        Mood and Affect: Mood is not anxious. Affect is blunt.        Speech: Speech normal.        Behavior: Behavior normal. Behavior is cooperative.        Thought Content: Thought content normal.        Cognition and Memory:  Cognition normal.        Judgment: Judgment normal.   Review of Systems  Psychiatric/Behavioral:  Positive for depression.   All other systems reviewed and are negative. Blood pressure 112/68, pulse 99, temperature 98.5 F (36.9 C), temperature source Oral, resp. rate 19, height $RemoveBe'5\' 6"'[id number]$  (1.676 m), weight 74.5 kg, SpO2 98 %. Body mass index is 26.5 kg/m.  Treatment Plan Summary: General anxiety disorder and depression: -Continue Lexapro 5 mg daily -Continue hydroxyzine 25 mg TID PRN  Agitation: -Continue Ativan 0.5 mg oral or IM if hydroxyzine above is not helpful  Disposition: No evidence of imminent risk to self or others at present.   Patient does not meet criteria for psychiatric inpatient admission. Supportive therapy provided about ongoing stressors.  Waylan Boga, NP 10/06/2020 4:26 PM

## 2020-10-06 NOTE — Progress Notes (Signed)
Physical Therapy Treatment Patient Details Name: Daisy Watkins MRN: [id number] DOB: 12-11-1932 Today's Date: 10/06/2020    History of Present Illness Pt is an 87 y/o admitted on 09/30/20 with c/c of AMS with confusion and agitation. Pt recently d/c on 09/15/20 after being admitted for change in mental status. PMH: anxiety, DVT, DM, GERD, insomnia, HTN, HLD, insomnia, myalgia.    PT Comments    Pt was awake in long sitting, dressed, laying in bed without sheets/blankets. Actuary pt however pt did not respond at first. Pt has severe dementia and cognition greatly limits session progression. She agrees to session at first however once seated EOB was unwilling to stand/ambulate. She gets agitated and states" I want to get the fuck out of here." Author attempted to motivate and encourage participation however pt unwilling. Once pt settled down she did agree to several exercises until she again gets agitated and requested author leave. Pt's dementia is limiting progression with PT. She has shown over hospitalization that she can get up and independently ambulate when willing. Author recommend DC to memory unit/ 24 hour assistance. PT will continue to follow and progress as able per POC.    Follow Up Recommendations  Other (comment);Supervision/Assistance - 24 hour;Supervision for mobility/OOB (ALF/memory unit would be best. Pt is able to move well/safely when willing.)     Equipment Recommendations  None recommended by PT       Precautions / Restrictions Precautions Precautions: None Restrictions Weight Bearing Restrictions: No    Mobility  Bed Mobility Overal bed mobility: Needs Assistance Bed Mobility: Supine to Sit;Sit to Supine     Supine to sit: Supervision Sit to supine: Supervision   General bed mobility comments: pt was able to exit L side of bed without physical assistance. After agreeing to OOB activity, changes her mind and quickly returns to supine in bed. She is unwilling  to get OOB again however was agreeable to several exercises in bed once she was able to relax.    Transfers      General transfer comment: pt unwilling however has been ablke to perform without assistance earlier in the day.  Ambulation/Gait    General Gait Details: pt was unwilling    Balance Overall balance assessment: Modified Independent     Sitting balance - Comments: Independent to sit EOB; standing balance not observed due to pt refusal to stand      Cognition Arousal/Alertness: Awake/alert Behavior During Therapy: Agitated;Impulsive;Anxious Overall Cognitive Status: No family/caregiver present to determine baseline cognitive functioning (severe cognitive deficits)      Following Commands: Follows one step commands inconsistently       General Comments: Pt cognition greatly impacts session. Pt was cooperative at first however becomes aggitated and resistive throughout session.      Exercises General Exercises - Lower Extremity Ankle Circles/Pumps: AROM;5 reps Heel Slides: AROM;10 reps Hip ABduction/ADduction: AROM;10 reps Straight Leg Raises: AROM;10 reps        Pertinent Vitals/Pain Pain Assessment: No/denies pain           PT Goals (current goals can now be found in the care plan section) Acute Rehab PT Goals Patient Stated Goal: none stated Progress towards PT goals: Not progressing toward goals - comment (pt's cognition and unwillingness to participate.)    Frequency    Min 2X/week      PT Plan Current plan remains appropriate       AM-PAC PT "6 Clicks" Mobility   Outcome Measure  Help needed  turning from your back to your side while in a flat bed without using bedrails?: None Help needed moving from lying on your back to sitting on the side of a flat bed without using bedrails?: None Help needed moving to and from a bed to a chair (including a wheelchair)?: A Little Help needed standing up from a chair using your arms (e.g., wheelchair  or bedside chair)?: A Little Help needed to walk in hospital room?: A Little Help needed climbing 3-5 steps with a railing? : A Little 6 Click Score: 20    End of Session   Activity Tolerance: Other (comment);Treatment limited secondary to agitation (limited by pt's cognition and unwillingness. Per RN staff pt is able to independently ambulate when she wants to.) Patient left: in bed;with bed alarm set;with call bell/phone within reach Nurse Communication: Mobility status PT Visit Diagnosis: Unsteadiness on feet (R26.81)     Time: 3875-6433 PT Time Calculation (min) (ACUTE ONLY): 10 min  Charges:  $Therapeutic Exercise: 8-22 mins                    Julaine Fusi PTA 10/06/20, 12:12 PM

## 2020-10-06 NOTE — Progress Notes (Signed)
Patient is refusing "patient care"  She did take her medications this morning, and will allow me to give Insulin but refuses anything else. Patient has not voided in 24 hours, and she refused bladderscan to check for retention. MD notified. Will continue to monitor

## 2020-10-07 DIAGNOSIS — I10 Essential (primary) hypertension: Secondary | ICD-10-CM | POA: Diagnosis not present

## 2020-10-07 LAB — GLUCOSE, CAPILLARY
Glucose-Capillary: 228 mg/dL — ABNORMAL HIGH (ref 70–99)
Glucose-Capillary: 271 mg/dL — ABNORMAL HIGH (ref 70–99)
Glucose-Capillary: 250 mg/dL — ABNORMAL HIGH (ref 70–99)
Glucose-Capillary: 239 mg/dL — ABNORMAL HIGH (ref 70–99)

## 2020-10-07 MED ORDER — INSULIN DETEMIR 100 UNIT/ML ~~LOC~~ SOLN
12.0000 [IU] | Freq: Every day | SUBCUTANEOUS | Status: DC
  Administered 2020-10-07: 12 [IU] via SUBCUTANEOUS
  Filled 2020-10-07 (×2): qty 0.12

## 2020-10-07 MED ORDER — LORAZEPAM 0.5 MG PO TABS: 0.5000 mg | ORAL_TABLET | Freq: Three times a day (TID) | ORAL | Status: DC | PRN

## 2020-10-07 NOTE — Progress Notes (Signed)
PROGRESS NOTE    Daisy Watkins  ZOX:[id number] DOB: 05-07-1932 DOA: 09/30/2020 PCP: Valerie Roys, DO   Chief Complaint  Patient presents with   Altered Mental Status     Brief Narrative: 87 year old female with PMH of Chronic Dementia with behavioral disturbances, Meningioma (09/14/20 MRI), HTN, DM2, CAD, Afib/DVT (on Eliquis) who presents to the ED on 7/3 with acute confusion, agitation with intermittent crying, and difficulty taking care of herself and taking home medications.  Patient was admitted for SNF placement.  Subjective: Daisy Watkins is pleasant this morning. She denies feeling depressed. States she is hungry and wants to eat breakfast. Neurological exam is stable.  There are no acute deficits.  There have been no reported seizure like activity. She denies any dysuria or urinary symptoms.  Assessment & Plan: Principal Problem:   Acute metabolic encephalopathy Active Problems:   Type 2 diabetes mellitus with hyperglycemia (HCC)   Coronary artery disease   Benign essential hypertension   Paroxysmal A-fib (HCC)   Anxiety, generalized   History of DVT (deep vein thrombosis)   Psychosis in elderly with behavioral disturbance (HCC)   AMS (altered mental status)   Meningioma (HCC)   Hypokalemia  Chronic Dementia with Behavioral Disturbances: Patient has had no episodes of agitation reported.  She has had no SI/HI. - Continue Lexapro at 20 mg daily.   - Continue Depakote at 750 mg daily.  Increase Depakote to 1000 mg daily on 7/13. - Continue Hydroxyzine 25 mg TID PRN for anxiety.  This can be increased to 50 mg if needed. - Continue Ativan 0.5 mg TID PRN for agitation.  Taper off once more stable.  Left Anterior Lobe Meningioma: This may be contributing to her fluctuating mental status. - Continue Depakote 750 mg daily.  Increase to 1000 mg as outlined above to achieve therapeutic dose for seizure control. - Patient was evaluated by Neurosurgery on 09/14/20 admission and  was deemed to be a poor surgical candidate. - She will follow up with Rad/Onc outpatient to consider radiotherapy.  Paroxysmal Atrial Fibrillation: - Her HR runs low - hold off on a rate controlling agent. - Continue Eliquis 5 mg BID for anticoagulation.  History of DVT, unprovoked: - Continue Eliquis 5 mg BID for anticoagulation.  Essential Hypertension: BP is stable. - Continue Hydralazine 50 mg TID. - Continue ACE.  Diabetes Mellitus Type 2: Glucose is slightly high. - Increase Levemir from 8 to 12 units nightly. - Continue Aspart SSi with POC glucose ACHS.  Coronary Artery Disease: - Continue Eliquis.   - She is out of age range for Statin.  And patient does not tolerate side effects.  Lactobacillus Urine Cuture: This is a normal flora of the female GU tract / contaminant.   Diet Order             Diet Carb Modified Fluid consistency: Thin; Room service appropriate? Yes with Assist  Diet effective now                   Nutrition Problem: Inadequate oral intake Etiology: poor appetite Signs/Symptoms: per patient/family report, meal completion < 50% Interventions: Liberalize Diet, MVI, Glucerna shake Patient's Body mass index is 26.5 kg/m.      DVT prophylaxis:  Code Status:   Code Status: Full Code  Family Communication: plan of care discussed with patient at bedside.  Status is: Inpatient  Remains inpatient appropriate because:Inpatient level of care appropriate due to severity of illness  Dispo: The  patient is from: ALF -              Anticipated d/c is to: SNF - Unable to discharge to [facility].  Family is unavailable to take care of patient.  Application was sent for Medicaid and guardianship with the state.  Placement will be a process.              Patient currently is medically stable to d/c.   Difficult to place patient Yes   Unresulted Labs (From admission, onward)     Start     Ordered   10/02/20 0825  Vitamin B1  Once,   R       Question:   Specimen collection method  Answer:  Lab=Lab collect   10/02/20 0825             Medications reviewed:  Scheduled Meds:  apixaban  5 mg Oral BID   divalproex  750 mg Oral Daily   escitalopram  20 mg Oral Daily   feeding supplement (GLUCERNA SHAKE)  237 mL Oral TID BM   hydrALAZINE  50 mg Oral Q8H   insulin aspart  0-15 Units Subcutaneous TID WC   insulin aspart  0-5 Units Subcutaneous QHS   insulin detemir  8 Units Subcutaneous QHS   multivitamin with minerals  1 tablet Oral Daily   ramipril  5 mg Oral Daily   thiamine injection  100 mg Intravenous Daily   vitamin B-12  1,000 mcg Oral Daily   Continuous Infusions:  Consultants:see note  Procedures:see note  Antimicrobials: Anti-infectives (From admission, onward)    None      Culture/Microbiology    Component Value Date/Time   SDES  09/30/2020 1956    URINE, RANDOM Performed at [facility], [address] [place], [place] [address]    [facility]  09/30/2020 1956    NONE Performed at [facility], [address] [place], [place] [address]    CULT (A) 09/30/2020 1956    >=100,000 COLONIES/mL LACTOBACILLUS SPECIES Standardized susceptibility testing for this organism is not available. Performed at [facility], [place] [address], [place], [place] [address]    REPTSTATUS 10/02/2020 FINAL 09/30/2020 1956    Other culture-see note  Objective: Vitals: Today's Vitals   10/07/20 0028 10/07/20 0417 10/07/20 0721 10/07/20 1157  BP: (!) 140/55 (!) 147/57 (!) 145/58 (!) 145/57  Pulse: 63 77 73 70  Resp: 16 16 18 18   Temp: 98.4 F (36.9 C) 97.6 F (36.4 C) 97.8 F (36.6 C) 97.8 F (36.6 C)  TempSrc:    Oral  SpO2: 95% 95% 97% 95%  Weight:      Height:      PainSc:   Asleep     Intake/Output Summary (Last 24 hours) at 10/07/2020 1304 Last data filed at 10/07/2020 1043 Gross per 24 hour  Intake 240 ml  Output --  Net 240 ml    Filed Weights   09/30/20 1031  Weight: 74.5 kg    Weight change:   Intake/Output from previous day: 07/09 0701 - 07/10 0700 In: 240 [P.O.:240] Out: -  Intake/Output this shift: No intake/output data recorded. Filed Weights   09/30/20 1031  Weight: 74.5 kg    Examination: General exam: alert and oriented, mental status intact, mood is stable HEENT: NCAT, PERRL Respiratory system: CTAB no WRR Cardiovascular system: Did not appreciate a murmur, regular, No JVD. Gastrointestinal system: No flank pain, Abdomen soft, NT,ND, BS+. Nervous System: No focal deficits.  Extremities: No edema, distal peripheral pulses palpable.  Skin: No rashes, No bruises, No icterus. MSK: Physical Deconditioning   Data Reviewed: I have personally reviewed following labs and imaging studies CBC: Recent Labs  Lab 10/02/20 0504 10/03/20 0647  WBC 4.4 4.2  NEUTROABS 2.2 2.1  HGB 12.7 13.3  HCT 37.0 39.0  MCV 84.3 85.7  PLT 195 546    Basic Metabolic Panel: Recent Labs  Lab 10/01/20 0659 10/02/20 0504 10/03/20 0647  NA 140 140 139  K 3.1* 3.2* 3.9  CL 102 102 103  CO2 27 29 26   GLUCOSE 178* 123* 175*  BUN 10 10 12   CREATININE 0.61 0.50 0.53  CALCIUM 9.2 9.2 9.5  MG  --  1.7 1.8  PHOS  --  2.7 3.3    GFR: Estimated Creatinine Clearance: 52.1 mL/min (by C-G formula based on SCr of 0.53 mg/dL). Liver Function Tests: Recent Labs  Lab 10/02/20 0504 10/03/20 0647  AST 25 22  ALT 25 24  ALKPHOS 76 86  BILITOT 1.6* 1.5*  PROT 5.7* 5.9*  ALBUMIN 3.3* 3.2*     Recent Labs  Lab 10/01/20 1656  AMMONIA 28     CBG: Recent Labs  Lab 10/06/20 1139 10/06/20 1629 10/06/20 2210 10/07/20 0757 10/07/20 1158  GLUCAP 209* 174* 258* 228* 271*      Recent Results (from the past 240 hour(s))  Resp Panel by RT-PCR (Flu A&B, Covid) Nasopharyngeal Swab     Status: None   Collection Time: 09/30/20 10:38 AM   Specimen: Nasopharyngeal Swab; Nasopharyngeal(NP) swabs in vial transport medium  Result Value Ref Range Status   SARS  Coronavirus 2 by RT PCR NEGATIVE NEGATIVE Final    Comment: (NOTE) SARS-CoV-2 target nucleic acids are NOT DETECTED.  The SARS-CoV-2 RNA is generally detectable in upper respiratory specimens during the acute phase of infection. The lowest concentration of SARS-CoV-2 viral copies this assay can detect is 138 copies/mL. A negative result does not preclude SARS-Cov-2 infection and should not be used as the sole basis for treatment or other patient management decisions. A negative result may occur with  improper specimen collection/handling, submission of specimen other than nasopharyngeal swab, presence of viral mutation(s) within the areas targeted by this assay, and inadequate number of viral copies(<138 copies/mL). A negative result must be combined with clinical observations, patient history, and epidemiological information. The expected result is Negative.  Fact Sheet for Patients:  EntrepreneurPulse.com.au  Fact Sheet for Healthcare Providers:  IncredibleEmployment.be  This test is no t yet approved or cleared by the [place] FDA and  has been authorized for detection and/or diagnosis of SARS-CoV-2 by FDA under an Emergency Use Authorization (EUA). This EUA will remain  in effect (meaning this test can be used) for the duration of the COVID-19 declaration under Section 564(b)(1) of the Act, 21 U.S.C.section 360bbb-3(b)(1), unless the authorization is terminated  or revoked sooner.       Influenza A by PCR NEGATIVE NEGATIVE Final   Influenza B by PCR NEGATIVE NEGATIVE Final    Comment: (NOTE) The Xpert Xpress SARS-CoV-2/FLU/RSV plus assay is intended as an aid in the diagnosis of influenza from Nasopharyngeal swab specimens and should not be used as a sole basis for treatment. Nasal washings and aspirates are unacceptable for Xpert Xpress SARS-CoV-2/FLU/RSV testing.  Fact Sheet for  Patients: EntrepreneurPulse.com.au  Fact Sheet for Healthcare Providers: IncredibleEmployment.be  This test is not yet approved or cleared by the [place] and has been authorized for  detection and/or diagnosis of SARS-CoV-2 by FDA under an Emergency Use Authorization (EUA). This EUA will remain in effect (meaning this test can be used) for the duration of the COVID-19 declaration under Section 564(b)(1) of the Act, 21 U.S.C. section 360bbb-3(b)(1), unless the authorization is terminated or revoked.  Performed at [facility], [address], [place], [place] [address]   Urine Culture     Status: Abnormal   Collection Time: 09/30/20  7:56 PM   Specimen: Urine, Random  Result Value Ref Range Status   Specimen Description   Final    URINE, RANDOM Performed at [facility], [address] [place], [place] [address]    Special Requests   Final    NONE Performed at [facility], [address] [place], [place] [address]    Culture (A)  Final    >=100,000 COLONIES/mL LACTOBACILLUS SPECIES Standardized susceptibility testing for this organism is not available. Performed at [facility], [place] [address] [place], [place] [address]    Report Status 10/02/2020 FINAL  Final      Radiology Studies: No results found.   LOS: 6 days   George Hugh, MD [facility]  10/07/2020, 1:04 PM

## 2020-10-08 DIAGNOSIS — I10 Essential (primary) hypertension: Secondary | ICD-10-CM | POA: Diagnosis not present

## 2020-10-08 LAB — GLUCOSE, CAPILLARY
Glucose-Capillary: 219 mg/dL — ABNORMAL HIGH (ref 70–99)
Glucose-Capillary: 260 mg/dL — ABNORMAL HIGH (ref 70–99)
Glucose-Capillary: 282 mg/dL — ABNORMAL HIGH (ref 70–99)
Glucose-Capillary: 289 mg/dL — ABNORMAL HIGH (ref 70–99)

## 2020-10-08 LAB — VITAMIN B1: Vitamin B1 (Thiamine): 86.3 nmol/L (ref 66.5–200.0)

## 2020-10-08 MED ORDER — HYDRALAZINE HCL 50 MG PO TABS: 25.0000 mg | ORAL_TABLET | Freq: Three times a day (TID) | ORAL | Status: DC

## 2020-10-08 MED ORDER — THIAMINE HCL 100 MG PO TABS
100.0000 mg | ORAL_TABLET | Freq: Every day | ORAL | Status: DC
  Administered 2020-10-08 – 2020-11-20 (×43): 100 mg via ORAL
  Filled 2020-10-08 (×43): qty 1

## 2020-10-08 MED ORDER — DIVALPROEX SODIUM ER 500 MG PO TB24
1000.0000 mg | ORAL_TABLET | Freq: Every day | ORAL | Status: DC
  Administered 2020-10-09 – 2020-11-20 (×43): 1000 mg via ORAL
  Filled 2020-10-08 (×43): qty 2

## 2020-10-08 MED ORDER — LORAZEPAM 0.5 MG PO TABS
0.5000 mg | ORAL_TABLET | Freq: Two times a day (BID) | ORAL | Status: AC
  Administered 2020-10-08 (×2): 0.5 mg via ORAL
  Filled 2020-10-08 (×2): qty 1

## 2020-10-08 MED ORDER — LUBIPROSTONE 24 MCG PO CAPS
24.0000 ug | ORAL_CAPSULE | Freq: Two times a day (BID) | ORAL | Status: AC
  Administered 2020-10-08 – 2020-10-09 (×2): 24 ug via ORAL
  Filled 2020-10-08 (×2): qty 1

## 2020-10-08 MED ORDER — CARVEDILOL 3.125 MG PO TABS
3.1250 mg | ORAL_TABLET | Freq: Two times a day (BID) | ORAL | Status: DC
  Administered 2020-10-08 – 2020-10-12 (×9): 3.125 mg via ORAL
  Filled 2020-10-08 (×10): qty 1

## 2020-10-08 MED ORDER — INSULIN DETEMIR 100 UNIT/ML ~~LOC~~ SOLN
12.0000 [IU] | Freq: Two times a day (BID) | SUBCUTANEOUS | Status: DC
  Administered 2020-10-08 – 2020-10-20 (×24): 12 [IU] via SUBCUTANEOUS
  Filled 2020-10-08 (×26): qty 0.12

## 2020-10-08 NOTE — Progress Notes (Addendum)
Nutrition Follow-up  DOCUMENTATION CODES:  Not applicable  INTERVENTION:  Obtain updated weight.  Recommend feeding assistance with meals and supplements.  Continue Glucerna shakes TID.  Continue MVI with minerals daily.  NUTRITION DIAGNOSIS:  Inadequate oral intake related to poor appetite as evidenced by per patient/family report, meal completion < 50%. - ongoing  GOAL:  Patient will meet greater than or equal to 90% of their needs - not meeting  MONITOR:  PO intake, Supplement acceptance, Weight trends  REASON FOR ASSESSMENT:  Malnutrition Screening Tool    ASSESSMENT:  87 y.o. female brought to ED from home with AMS. Family reports that pt was agitated earlier in the day and then became unresponsive. PMH relevant for DM, GERD, HTN, HLD, Vitamin B12 and D deficiencies, and atrial fibrillation.  Per Epic, pt consuming mostly 0% of meals with a scattered 50% and 5% intakes. This is likely due to dementia. RD to order RN assist with feeding.  Pt in need of new weight. RD to order new weight.  Continue Glucerna shakes TID and MVI with minerals.  If within [place], if PO intake does not improve, may need to begin nutrition support soon.  Medications: reviewed; Depakote, Glucerna TID, SSI, bedtime Novolog, Levemir, Ativan, MVI with minerals, thiamine, Vitamin B12  Labs: reviewed; CBG 219-271 (H)  Diet Order:   Diet Order             Diet Carb Modified Fluid consistency: Thin; Room service appropriate? Yes with Assist  Diet effective now                  EDUCATION NEEDS:  No education needs have been identified at this time  Skin:  Skin Assessment: Reviewed RN Assessment  Last BM:  10/01/20 - OBR in place  Height:  Ht Readings from Last 1 Encounters:  09/30/20 5\' 6"  (1.676 m)   Weight:  Wt Readings from Last 1 Encounters:  09/30/20 74.5 kg   BMI:  Body mass index is 26.5 kg/m.  Estimated Nutritional Needs:  Kcal:  1600-1800 kcal/d Protein:  80-90  g/d Fluid:  1.8-2L/d  Derrel Nip, RD, LDN (she/her/hers) Registered Dietitian I After-Hours/Weekend Pager # in [place]

## 2020-10-08 NOTE — Progress Notes (Addendum)
PROGRESS NOTE    Daisy Watkins  ZTI:[id number] DOB: 1932-11-23 DOA: 09/30/2020 PCP: Valerie Roys, DO   Chief Complaint  Patient presents with   Altered Mental Status     Brief Narrative: 87 year old female with PMH of Chronic Dementia with behavioral disturbances, Meningioma (09/14/20 MRI), HTN, DM2, CAD, Afib/DVT (on Eliquis) who presents to the ED on 7/3 with acute confusion, agitation with intermittent crying, and difficulty taking care of herself and taking home medications.  Patient was admitted for SNF placement.  Subjective: Daisy Watkins is doing well this morning but is less active with PT. She states she feels fine. Denies depression or anxiety. States she is hungry but did not eat breakfast.   Had Ensure and ate lunch. Neurological exam is stable.  There are no acute deficits.  There have been no reported seizure like activity. She denies any dysuria or urinary symptoms.  Assessment & Plan: Principal Problem:   Acute metabolic encephalopathy Active Problems:   Type 2 diabetes mellitus with hyperglycemia (HCC)   Coronary artery disease   Benign essential hypertension   Paroxysmal A-fib (HCC)   Anxiety, generalized   History of DVT (deep vein thrombosis)   Psychosis in elderly with behavioral disturbance (HCC)   AMS (altered mental status)   Meningioma (HCC)   Hypokalemia  Chronic Dementia with Behavioral Disturbances: Patient has had no episodes of agitation reported.  She has had no SI/HI. - Continue Lexapro at 20 mg daily.   - Increase Depakote to 1000 mg daily. - Continue Hydroxyzine 25 mg TID PRN for anxiety.  This can be increased to 50 mg if needed. - Give Ativan 0.5 mg TID PRN for agitation.  Left Anterior Lobe Meningioma: This may be contributing to her fluctuating mental status. - Increase Depakote to 1000 mg daily as outlined above to achieve therapeutic dose for seizure control. - Patient was evaluated by Neurosurgery on 09/14/20 admission and was deemed  to be a poor surgical candidate. - She will follow up with Rad/Onc outpatient to consider radiotherapy.  Paroxysmal Atrial Fibrillation: - HR is up to 100 this am.  Start Coreg 3.125 mg BID and discontinue Hydralazine.  HR ran low earlier in her admission - monitor. - Continue Eliquis 5 mg BID for anticoagulation.  History of DVT, unprovoked: - Continue Eliquis 5 mg BID for anticoagulation.  Essential Hypertension: BP is stable. - Start Coreg 3.125 mg BID and discontinue Hydralazine. - Continue ACE.  Diabetes Mellitus Type 2: Glucose is slightly high. - Increase Levemir to 12 units daily to BID. - Continue Aspart SSi with POC glucose ACHS.  Coronary Artery Disease: - Continue Eliquis.   - She is out of age range for Statin.  And patient does not tolerate side effects.  Mild Constipation: - Give Amitiza 24 mcg BID.  If no BM x 24 hours, start regular bowel regimen in the am.  Lactobacillus Urine Cuture: This is a normal flora of the female GU tract / contaminant.   Diet Order             Diet Carb Modified Fluid consistency: Thin; Room service appropriate? Yes with Assist  Diet effective now                   Nutrition Problem: Inadequate oral intake Etiology: poor appetite Signs/Symptoms: per patient/family report, meal completion < 50% Interventions: Liberalize Diet, MVI, Glucerna shake Patient's Body mass index is 26.5 kg/m.   DVT prophylaxis:  Code Status:  Code Status: Full Code  Family Communication: plan of care discussed with patient at bedside.  Status is: Inpatient  Remains inpatient appropriate because:Inpatient level of care appropriate due to severity of illness  Dispo: The patient is from: ALF -              Anticipated d/c is to: SNF - Unable to discharge to [facility].  Family is unavailable to take care of patient.  Application was sent for [place] Medicaid and guardianship with the state.  Placement will be a process.              Patient  currently is medically stable to d/c.   Difficult to place patient Yes   Unresulted Labs (From admission, onward)    None        Medications reviewed:  Scheduled Meds:  apixaban  5 mg Oral BID   [START ON 10/09/2020] divalproex  1,000 mg Oral Daily   escitalopram  20 mg Oral Daily   feeding supplement (GLUCERNA SHAKE)  237 mL Oral TID BM   hydrALAZINE  50 mg Oral Q8H   insulin aspart  0-15 Units Subcutaneous TID WC   insulin aspart  0-5 Units Subcutaneous QHS   insulin detemir  12 Units Subcutaneous BID   LORazepam  0.5 mg Oral BID   multivitamin with minerals  1 tablet Oral Daily   ramipril  5 mg Oral Daily   thiamine  100 mg Oral Daily   vitamin B-12  1,000 mcg Oral Daily   Continuous Infusions:  Consultants:see note  Procedures:see note  Antimicrobials: Anti-infectives (From admission, onward)    None      Culture/Microbiology    Component Value Date/Time   SDES  09/30/2020 1956    URINE, RANDOM Performed at [facility], [address] [place], [place] [address]    [facility]  09/30/2020 1956    NONE Performed at [facility], [address] [place], [place] [address]    CULT (A) 09/30/2020 1956    >=100,000 COLONIES/mL LACTOBACILLUS SPECIES Standardized susceptibility testing for this organism is not available. Performed at [facility], [place] [address], [place], [place] [address]    REPTSTATUS 10/02/2020 FINAL 09/30/2020 1956    Other culture-see note  Objective: Vitals: Today's Vitals   10/07/20 1934 10/07/20 2000 10/08/20 0405 10/08/20 0836  BP: (!) 151/53  135/61 137/62  Pulse: 89  100 (!) 109  Resp: 18  16 16   Temp: 98 F (36.7 C)  98.6 F (37 C) 98.9 F (37.2 C)  TempSrc:      SpO2: 95%  95% 97%  Weight:      Height:      PainSc:  0-No pain      Intake/Output Summary (Last 24 hours) at 10/08/2020 1233 Last data filed at 10/08/2020 1029 Gross per 24 hour  Intake 240 ml  Output 250 ml  Net -10 ml     Filed Weights   09/30/20 1031  Weight: 74.5 kg   Weight change:   Intake/Output from previous day: 07/10 0701 - 07/11 0700 In: 240 [P.O.:240] Out: 250 [Urine:250] Intake/Output this shift: No intake/output data recorded. Filed Weights   09/30/20 1031  Weight: 74.5 kg    Examination: General exam: alert and oriented, mental status intact, mood is stable HEENT: NCAT, PERRL Respiratory system: CTAB no WRR Cardiovascular system: Did not appreciate a murmur, regular, No JVD. Gastrointestinal system: No flank pain, Abdomen soft, NT,ND, BS+. Nervous System: No focal  deficits. Extremities: No edema, distal peripheral pulses palpable.  Skin: No rashes, No bruises, No icterus. MSK: Physical Deconditioning   Data Reviewed: I have personally reviewed following labs and imaging studies CBC: Recent Labs  Lab 10/02/20 0504 10/03/20 0647  WBC 4.4 4.2  NEUTROABS 2.2 2.1  HGB 12.7 13.3  HCT 37.0 39.0  MCV 84.3 85.7  PLT 195 962    Basic Metabolic Panel: Recent Labs  Lab 10/02/20 0504 10/03/20 0647  NA 140 139  K 3.2* 3.9  CL 102 103  CO2 29 26  GLUCOSE 123* 175*  BUN 10 12  CREATININE 0.50 0.53  CALCIUM 9.2 9.5  MG 1.7 1.8  PHOS 2.7 3.3    GFR: Estimated Creatinine Clearance: 52.1 mL/min (by C-G formula based on SCr of 0.53 mg/dL). Liver Function Tests: Recent Labs  Lab 10/02/20 0504 10/03/20 0647  AST 25 22  ALT 25 24  ALKPHOS 76 86  BILITOT 1.6* 1.5*  PROT 5.7* 5.9*  ALBUMIN 3.3* 3.2*     Recent Labs  Lab 10/01/20 1656  AMMONIA 28     CBG: Recent Labs  Lab 10/07/20 1158 10/07/20 1748 10/07/20 2114 10/08/20 0839 10/08/20 1206  GLUCAP 271* 250* 239* 219* 260*      Recent Results (from the past 240 hour(s))  Resp Panel by RT-PCR (Flu A&B, Covid) Nasopharyngeal Swab     Status: None   Collection Time: 09/30/20 10:38 AM   Specimen: Nasopharyngeal Swab; Nasopharyngeal(NP) swabs in vial transport medium  Result Value Ref Range Status    SARS Coronavirus 2 by RT PCR NEGATIVE NEGATIVE Final    Comment: (NOTE) SARS-CoV-2 target nucleic acids are NOT DETECTED.  The SARS-CoV-2 RNA is generally detectable in upper respiratory specimens during the acute phase of infection. The lowest concentration of SARS-CoV-2 viral copies this assay can detect is 138 copies/mL. A negative result does not preclude SARS-Cov-2 infection and should not be used as the sole basis for treatment or other patient management decisions. A negative result may occur with  improper specimen collection/handling, submission of specimen other than nasopharyngeal swab, presence of viral mutation(s) within the areas targeted by this assay, and inadequate number of viral copies(<138 copies/mL). A negative result must be combined with clinical observations, patient history, and epidemiological information. The expected result is Negative.  Fact Sheet for Patients:  EntrepreneurPulse.com.au  Fact Sheet for Healthcare Providers:  IncredibleEmployment.be  This test is no t yet approved or cleared by the [place] FDA and  has been authorized for detection and/or diagnosis of SARS-CoV-2 by FDA under an Emergency Use Authorization (EUA). This EUA will remain  in effect (meaning this test can be used) for the duration of the COVID-19 declaration under Section 564(b)(1) of the Act, 21 U.S.C.section 360bbb-3(b)(1), unless the authorization is terminated  or revoked sooner.       Influenza A by PCR NEGATIVE NEGATIVE Final   Influenza B by PCR NEGATIVE NEGATIVE Final    Comment: (NOTE) The Xpert Xpress SARS-CoV-2/FLU/RSV plus assay is intended as an aid in the diagnosis of influenza from Nasopharyngeal swab specimens and should not be used as a sole basis for treatment. Nasal washings and aspirates are unacceptable for Xpert Xpress SARS-CoV-2/FLU/RSV testing.  Fact Sheet for  Patients: EntrepreneurPulse.com.au  Fact Sheet for Healthcare Providers: IncredibleEmployment.be  This test is not yet approved or cleared by the [place] FDA and has been authorized for detection and/or diagnosis of SARS-CoV-2 by FDA under an Emergency Use Authorization (EUA). This EUA  will remain in effect (meaning this test can be used) for the duration of the COVID-19 declaration under Section 564(b)(1) of the Act, 21 U.S.C. section 360bbb-3(b)(1), unless the authorization is terminated or revoked.  Performed at [facility], [address], [place], [place] [address]   Urine Culture     Status: Abnormal   Collection Time: 09/30/20  7:56 PM   Specimen: Urine, Random  Result Value Ref Range Status   Specimen Description   Final    URINE, RANDOM Performed at [facility], [address], [place], [place] [address]    Special Requests   Final    NONE Performed at [facility], [address] [place], [place] [address]    Culture (A)  Final    >=100,000 COLONIES/mL LACTOBACILLUS SPECIES Standardized susceptibility testing for this organism is not available. Performed at [facility], [place] [address] [place], [place] [address]    Report Status 10/02/2020 FINAL  Final      Radiology Studies: No results found.   LOS: 7 days   George Hugh, MD [facility]  10/08/2020, 12:33 PM

## 2020-10-08 NOTE — Progress Notes (Signed)
Physical Therapy Treatment Patient Details Name: Daisy Watkins MRN: [id number] DOB: [birth year] Today's Date: 10/08/2020    History of Present Illness Pt is an 87 y/o admitted on 09/30/20 with c/c of AMS with confusion and agitation. Pt recently d/c on 09/15/20 after being admitted for change in mental status. PMH: anxiety, DVT, DM, GERD, insomnia, HTN, HLD, insomnia, myalgia.    PT Comments    Pt alert, in bed, oriented to name. Spoke in 1-2 words at a time. Pt declined mobility/OOB throughout session despite education, encouragement, and re-direction, as well as offered functional tasks (teeth brushing, bathroom, sitting up to eat, etc). Agreeable to LE exercises with constant verbal and tactile cueing for technique. Recommendation remains appropriate at this time.     Follow Up Recommendations  Other (comment);Supervision/Assistance - 24 hour;Supervision for mobility/OOB (ALF/memory care unit)     Equipment Recommendations  None recommended by PT    Recommendations for Other Services       Precautions / Restrictions Precautions Precautions: None Restrictions Weight Bearing Restrictions: No    Mobility  Bed Mobility               General bed mobility comments: pt declined mobility/OOB throughout session despite education, encouragement, and re-direction, as well as despite offered functional tasks (teeth brushing, bathroom, sitting up to eat, etc)    Transfers                    Ambulation/Gait                 Stairs             Wheelchair Mobility    Modified Rankin (Stroke Patients Only)       Balance                                            Cognition Arousal/Alertness: Awake/alert Behavior During Therapy: Flat affect Overall Cognitive Status: No family/caregiver present to determine baseline cognitive functioning                                        Exercises General Exercises - Lower  Extremity Ankle Circles/Pumps: AROM;10 reps Heel Slides: AROM;10 reps;Strengthening;Both Hip ABduction/ADduction: AROM;10 reps;Strengthening;Both Straight Leg Raises: AROM;10 reps;Both;Strengthening    General Comments        Pertinent Vitals/Pain Pain Assessment: Faces Faces Pain Scale: No hurt    Home Living                      Prior Function            PT Goals (current goals can now be found in the care plan section) Progress towards PT goals: Not progressing toward goals - comment (limited due to pt cognition)    Frequency    Min 2X/week      PT Plan Current plan remains appropriate    Co-evaluation              AM-PAC PT "6 Clicks" Mobility   Outcome Measure  Help needed turning from your back to your side while in a flat bed without using bedrails?: None Help needed moving from lying on your back to sitting on the side of a flat bed without using bedrails?: None Help  needed moving to and from a bed to a chair (including a wheelchair)?: A Little Help needed standing up from a chair using your arms (e.g., wheelchair or bedside chair)?: A Little Help needed to walk in hospital room?: A Little Help needed climbing 3-5 steps with a railing? : A Little 6 Click Score: 20    End of Session   Activity Tolerance: Other (comment) (limited by cognitive status, pt self directive of care) Patient left: in bed;with bed alarm set;with call bell/phone within reach Nurse Communication: Mobility status PT Visit Diagnosis: Unsteadiness on feet (R26.81)     Time: 9381-8299 PT Time Calculation (min) (ACUTE ONLY): 10 min  Charges:  $Therapeutic Exercise: 8-22 mins                     Lieutenant Diego PT, DPT 11:26 AM,10/08/20

## 2020-10-09 DIAGNOSIS — I10 Essential (primary) hypertension: Secondary | ICD-10-CM | POA: Diagnosis not present

## 2020-10-09 LAB — GLUCOSE, CAPILLARY
Glucose-Capillary: 197 mg/dL — ABNORMAL HIGH (ref 70–99)
Glucose-Capillary: 288 mg/dL — ABNORMAL HIGH (ref 70–99)
Glucose-Capillary: 254 mg/dL — ABNORMAL HIGH (ref 70–99)
Glucose-Capillary: 295 mg/dL — ABNORMAL HIGH (ref 70–99)
Glucose-Capillary: 168 mg/dL — ABNORMAL HIGH (ref 70–99)

## 2020-10-09 MED ORDER — SENNOSIDES-DOCUSATE SODIUM 8.6-50 MG PO TABS
1.0000 | ORAL_TABLET | Freq: Every day | ORAL | Status: DC
  Administered 2020-10-09 – 2020-11-19 (×42): 1 via ORAL
  Filled 2020-10-09 (×45): qty 1

## 2020-10-09 NOTE — NC FL2 (Signed)
[place] LEVEL OF CARE SCREENING TOOL     IDENTIFICATION  Patient Name: Daisy Watkins Birthdate: November 26, 1932 Sex: female Admission Date (Current Location): 09/30/2020  [facility] and [place] Number:  Engineering geologist and Address:  [facility], [address], [place], [place] [address]      Provider Number: [id number]  Attending Physician Name and Address:  Terrilee Croak, MD  Relative Name and Phone Number:  [name] (daughter) [phone]    Current Level of Care: Hospital Recommended Level of Care: [facility], Memory Care Prior Approval Number:    Date Approved/Denied:   PASRR Number:    Discharge Plan: Other (Comment) (Memory Care for Dementia)    Current Diagnoses: Patient Active Problem List   Diagnosis Date Noted   Acute metabolic encephalopathy 56/81/2751   Hypokalemia 09/30/2020   Malnutrition of moderate degree ([place]) 09/24/2020   Meningioma ([place]) 09/15/2020   Confusion 09/15/2020   Psychosis in elderly with behavioral disturbance ([place]) 09/14/2020   AMS (altered mental status) 09/14/2020   Shoulder impingement 05/01/2017   Insomnia 11/12/2016   DNR (do not resuscitate) 09/30/2016   Advance directive discussed with patient 09/30/2016   Allergic rhinitis 11/23/2015   DVT, lower extremity, recurrent ([place]) 12/26/2014   Anxiety, generalized 11/23/2014   Aortic atherosclerosis ([place]) 09/28/2014   Coronary artery disease 09/28/2014   Fatty liver 09/28/2014   Paroxysmal A-fib ([place]) 09/28/2014   Benign essential hypertension 09/26/2014   GERD (gastroesophageal reflux disease) 09/26/2014   History of DVT (deep vein thrombosis) 09/26/2014   Type 2 diabetes mellitus with hyperglycemia ([place]) 09/21/2014   Hyperlipidemia, mixed 09/21/2014   Postzoster neuralgia 09/21/2014    Orientation RESPIRATION BLADDER Height & Weight     Self, Place, Time  Normal Continent Weight: 74.5 kg Height:  5\' 6"  (167.6 cm)   BEHAVIORAL SYMPTOMS/MOOD NEUROLOGICAL BOWEL NUTRITION STATUS  Other (Comment) (memory impairment)   Continent Diet (Heart Healthy/ Carb modified)  AMBULATORY STATUS COMMUNICATION OF NEEDS Skin   Supervision Verbally Normal                       Personal Care Assistance Level of Assistance  Bathing, Feeding, Dressing Bathing Assistance: Limited assistance Feeding assistance: Limited assistance Dressing Assistance: Limited assistance     Functional Limitations Info  Sight, Hearing, Speech Sight Info: Adequate Hearing Info: Adequate Speech Info: Adequate    SPECIAL CARE FACTORS FREQUENCY                       Contractures Contractures Info: Not present    Additional Factors Info  Code Status, Allergies Code Status Info: Full Allergies Info: Ciprofloxacin, Clindamycin, elemental sulfur, HCTZ, doxycycline           Current Medications (10/09/2020):  This is the current hospital active medication list Current Facility-Administered Medications  Medication Dose Route Frequency Provider Last Rate Last Admin   acetaminophen (TYLENOL) suppository 650 mg  650 mg Rectal Q6H PRN Ivor Costa, MD       acetaminophen (TYLENOL) tablet 650 mg  650 mg Oral Q6H PRN Ivor Costa, MD       apixaban Arne Cleveland) tablet 5 mg  5 mg Oral BID Ivor Costa, MD   5 mg at 10/09/20 0830   carvedilol (COREG) tablet 3.125 mg  3.125 mg Oral BID WC George Hugh, MD   3.125 mg at 10/09/20 0830   divalproex (DEPAKOTE ER) 24 hr tablet 1,000 mg  1,000  mg Oral Daily George Hugh, MD   1,000 mg at 10/09/20 0829   escitalopram (LEXAPRO) tablet 20 mg  20 mg Oral Daily George Hugh, MD   20 mg at 10/09/20 0830   feeding supplement (GLUCERNA SHAKE) (GLUCERNA SHAKE) liquid 237 mL  237 mL Oral TID BM Elodia Florence., MD   237 mL at 10/09/20 1017   hydrALAZINE (APRESOLINE) injection 5 mg  5 mg Intravenous Q2H PRN Ivor Costa, MD   5 mg at 10/04/20 0750   insulin aspart (novoLOG) injection 0-15 Units   0-15 Units Subcutaneous TID WC Ivor Costa, MD   8 Units at 10/09/20 1257   insulin aspart (novoLOG) injection 0-5 Units  0-5 Units Subcutaneous QHS Ivor Costa, MD   3 Units at 10/08/20 2027   insulin detemir (LEVEMIR) injection 12 Units  12 Units Subcutaneous BID George Hugh, MD   12 Units at 10/09/20 0831   multivitamin with minerals tablet 1 tablet  1 tablet Oral Daily Elodia Florence., MD   1 tablet at 10/09/20 0830   ondansetron (ZOFRAN) injection 4 mg  4 mg Intravenous Q8H PRN Ivor Costa, MD       ramipril (ALTACE) capsule 5 mg  5 mg Oral Daily Ivor Costa, MD   5 mg at 10/09/20 3832   senna-docusate (Senokot-S) tablet 1 tablet  1 tablet Oral QHS Dahal, Marlowe Aschoff, MD       thiamine tablet 100 mg  100 mg Oral Daily Rito Ehrlich A, RPH   100 mg at 10/09/20 0830   vitamin B-12 (CYANOCOBALAMIN) tablet 1,000 mcg  1,000 mcg Oral Daily Elodia Florence., MD   1,000 mcg at 10/09/20 0830     Discharge Medications: Please see discharge summary for a list of discharge medications.  Relevant Imaging Results:  Relevant Lab Results:   Additional Information SS# [id number]  Shelbie Hutching, RN

## 2020-10-09 NOTE — TOC Progression Note (Signed)
Transition of Care [facility]) - Progression Note    Patient Details  Name: Daisy Watkins MRN: [id number] Date of Birth: 1932/11/06  [facility] of [facility] CM/SW Contact  Shelbie Hutching, RN Phone Number: 10/09/2020, 1:26 PM  Clinical Narrative:    RNCM has reached out to financial counselor to help start long term care Medicaid application for placement.  RNCM also left a message with [facility] to see if they may be able to accept patient with Medicaid Pending.   RNCM also reached out to Nelida Meuse at [facility] for assistance with placement.     Expected Discharge Plan: Assisted Living Barriers to Discharge: Continued Medical Work up  Expected Discharge Plan and Services Expected Discharge Plan: Assisted Living   Discharge Planning Services: CM Consult   Living arrangements for the past 2 months: Single Family Home Expected Discharge Date: 10/03/20               DME Arranged: N/A DME Agency: NA       HH Arranged: NA           Social Determinants of Health (SDOH) Interventions    Readmission Risk Interventions No flowsheet data found.

## 2020-10-09 NOTE — Progress Notes (Signed)
PROGRESS NOTE  Daisy Watkins  DOB: January 13, 1933  PCP: Valerie Roys, DO ZOX:[id number]  DOA: 09/30/2020  LOS: 8 days  Hospital Day: 69   Chief Complaint  Patient presents with   Altered Mental Status    Brief narrative: Daisy Watkins is a 87 y.o. female with PMH significant for chronic dementia with behavioral disturbances, Meningioma (09/14/20 MRI), HTN, DM2, CAD, Afib/DVT (on Eliquis). Patient presented to the ED on 7/3 with acute confusion, agitation with intermittent crying, and difficulty taking care of herself and taking home medications.   Patient was admitted for placement.  Subjective: Patient was seen and examined this morning.  Pleasant elderly African-American female.  Lying in bed.  Not in distress.  No new symptoms.  Assessment/Plan: Chronic Dementia with Behavioral Disturbances -Patient has had no episodes of agitation reported.  She has had no SI/HI. -Continue Lexapro at 20 mg daily, Depakote to 1000 mg daily. -Continue Ativan 0.5 mg 3 times daily as needed for agitation, hydroxyzine 25 mg TID PRN for anxiety.  This can be increased to 50 mg if needed.   Left Anterior Lobe Meningioma -On Depakote for seizure control.   -Patient was evaluated by Neurosurgery on 09/14/20 admission and was deemed to be a poor surgical candidate. -She will follow up with Rad/Onc outpatient to consider radiotherapy.   Paroxysmal Atrial Fibrillation -Heart rate controlled on Coreg 3.125 mg twice daily.  -Continue Eliquis 5 mg BID for anticoagulation.   History of DVT, unprovoked -Continue Eliquis 5 mg BID for anticoagulation.   Essential Hypertension -Blood pressure controlled on Coreg and ramipril  Uncontrolled type 2 diabetes mellitus -A1c 10.2 on 09/14/2020 -Currently on Levemir 12 units twice daily along with sliding scale insulin and Accu-Cheks -Blood sugar level seems to be gradually improving but is still elevated, 197 this morning.  Monitor for next 24 hours and maybe  increase Levemir if sugar level persistently elevated. Recent Labs  Lab 10/08/20 0839 10/08/20 1206 10/08/20 1557 10/08/20 2020 10/09/20 0800  GLUCAP 219* 260* 282* 289* 197*    Coronary Artery Disease -On Eliquis.  Not on statin because of inability to tolerate side effect   Mild Constipation -Currently on Amitiza 24 mcg BID.  If no BM x 24 hours, start regular bowel regimen in the am.   Lactobacillus Urine Cuture -This is a normal flora of the female GU tract / contaminant.  Mobility: PT eval obtained Code Status:   Code Status: Full Code  Nutritional status: Body mass index is 26.5 kg/m. Nutrition Problem: Inadequate oral intake Etiology: poor appetite Signs/Symptoms: per patient/family report, meal completion < 50% Diet:  Diet Order             Diet Carb Modified Fluid consistency: Thin; Room service appropriate? Yes with Assist  Diet effective now                  DVT prophylaxis:   apixaban (ELIQUIS) tablet 5 mg   Antimicrobials: None Fluid: None Consultants: None Family Communication: None at bedside  Status is: Inpatient  Remains inpatient appropriate because: Patient needs placement  Dispo: The patient is from: Home              Anticipated d/c is to: Long-term placement              Patient currently is medically stable to d/c.   Difficult to place patient No     Infusions:    Scheduled Meds:  apixaban  5 mg Oral  BID   carvedilol  3.125 mg Oral BID WC   divalproex  1,000 mg Oral Daily   escitalopram  20 mg Oral Daily   feeding supplement (GLUCERNA SHAKE)  237 mL Oral TID BM   insulin aspart  0-15 Units Subcutaneous TID WC   insulin aspart  0-5 Units Subcutaneous QHS   insulin detemir  12 Units Subcutaneous BID   multivitamin with minerals  1 tablet Oral Daily   ramipril  5 mg Oral Daily   thiamine  100 mg Oral Daily   vitamin B-12  1,000 mcg Oral Daily    Antimicrobials: Anti-infectives (From admission, onward)    None        PRN meds: acetaminophen, acetaminophen, hydrALAZINE, ondansetron (ZOFRAN) IV   Objective: Vitals:   10/09/20 0413 10/09/20 0753  BP: (!) 140/51 (!) 142/55  Pulse: 70 76  Resp: 18 16  Temp: 99.6 F (37.6 C) 98.6 F (37 C)  SpO2: 96% 96%    Intake/Output Summary (Last 24 hours) at 10/09/2020 0859 Last data filed at 10/08/2020 2130 Gross per 24 hour  Intake 240 ml  Output --  Net 240 ml   Filed Weights   09/30/20 1031  Weight: 74.5 kg   Weight change:  Body mass index is 26.5 kg/m.   Physical Exam: General exam: Pleasant, elderly African-American female.  Cheerfully demented.  Not in physical distress Skin: No rashes, lesions or ulcers. HEENT: Atraumatic, normocephalic, no obvious bleeding Lungs: Clear to auscultation bilaterally CVS: Regular rate and rhythm, no murmur GI/Abd soft, nontender, nondistended, bowel sound present CNS: Alert, awake, cheerful.  Not oriented Psychiatry: Mood appropriate Extremities: No pedal edema, no calf tenderness  Data Review: I have personally reviewed the laboratory data and studies available.  Recent Labs  Lab 10/03/20 0647  WBC 4.2  NEUTROABS 2.1  HGB 13.3  HCT 39.0  MCV 85.7  PLT 202   Recent Labs  Lab 10/03/20 0647  NA 139  K 3.9  CL 103  CO2 26  GLUCOSE 175*  BUN 12  CREATININE 0.53  CALCIUM 9.5  MG 1.8  PHOS 3.3    F/u labs not needed Unresulted Labs (From admission, onward)    None       Signed, Terrilee Croak, MD [facility] 10/09/2020

## 2020-10-10 DIAGNOSIS — G9341 Metabolic encephalopathy: Secondary | ICD-10-CM | POA: Diagnosis not present

## 2020-10-10 LAB — GLUCOSE, CAPILLARY
Glucose-Capillary: 145 mg/dL — ABNORMAL HIGH (ref 70–99)
Glucose-Capillary: 211 mg/dL — ABNORMAL HIGH (ref 70–99)
Glucose-Capillary: 134 mg/dL — ABNORMAL HIGH (ref 70–99)
Glucose-Capillary: 80 mg/dL (ref 70–99)

## 2020-10-10 NOTE — Progress Notes (Signed)
PROGRESS NOTE  Daisy Watkins  DOB: 1933/02/12  PCP: Valerie Roys, DO PJK:[id number]  DOA: 09/30/2020  LOS: 9 days  Hospital Day: 77   Chief Complaint  Patient presents with   Altered Mental Status    Brief narrative: Daisy Watkins is a 87 y.o. female with PMH significant for chronic dementia with behavioral disturbances, Meningioma (09/14/20 MRI), HTN, DM2, CAD, Afib/DVT (on Eliquis). Patient presented to the ED on 7/3 with acute confusion, agitation with intermittent crying, and difficulty taking care of herself and taking home medications.   Patient was admitted for placement.  Subjective: Patient was seen and examined this morning.   Lying on bed.  Not in distress.  Opens eyes on verbal command.  Able to verbalize but demented.  Not oriented.  Assessment/Plan: Chronic Dementia with Behavioral Disturbances -Patient has had no episodes of agitation reported.  She has had no SI/HI. -Currently patient is alert, awake but not oriented to time place or person.  Not restless or agitated. -Continue Lexapro at 20 mg daily, Depakote to 1000 mg daily. -Continue Ativan 0.5 mg 3 times daily as needed for agitation, hydroxyzine 25 mg TID PRN for anxiety.  This can be increased to 50 mg if needed.   Left Anterior Lobe Meningioma -On Depakote for seizure control.   -Patient was evaluated by Neurosurgery on 09/14/20 admission and was deemed to be a poor surgical candidate. -She can follow up with Rad/Onc outpatient to consider radiotherapy.   Paroxysmal Atrial Fibrillation -Heart rate controlled on Coreg 3.125 mg twice daily.  -Continue Eliquis 5 mg BID for anticoagulation.   History of DVT, unprovoked -Continue Eliquis 5 mg BID for anticoagulation.   Essential Hypertension -Blood pressure controlled on Coreg and ramipril  Uncontrolled type 2 diabetes mellitus -A1c 10.2 on 09/14/2020 -Currently on Levemir 12 units twice daily along with sliding scale insulin and Accu-Cheks. -Blood  sugar level improving last 24 hours, 145 this morning. Recent Labs  Lab 10/09/20 1157 10/09/20 1646 10/09/20 1651 10/09/20 2111 10/10/20 0736  GLUCAP 288* 295* 254* 168* 145*     Coronary Artery Disease -On Eliquis.  Not on statin because of inability to tolerate side effect   Mild Constipation -Currently on Amitiza 24 mcg BID.  If no BM x 24 hours, start regular bowel regimen in the am.   Lactobacillus Urine Cuture -This is a normal flora of the female GU tract / contaminant.  Mobility: PT eval obtained Code Status:   Code Status: Full Code  Nutritional status: Body mass index is 26.5 kg/m. Nutrition Problem: Inadequate oral intake Etiology: poor appetite Signs/Symptoms: per patient/family report, meal completion < 50% Diet:  Diet Order             Diet Carb Modified Fluid consistency: Thin; Room service appropriate? Yes with Assist  Diet effective now                  DVT prophylaxis:   apixaban (ELIQUIS) tablet 5 mg   Antimicrobials: None Fluid: None Consultants: None Family Communication: None at bedside  Status is: Inpatient  Remains inpatient appropriate because: Patient needs placement  Dispo: The patient is from: Home              Anticipated d/c is to: Long-term placement              Patient currently is medically stable to d/c.   Difficult to place patient No     Infusions:    Scheduled Meds:  apixaban  5 mg Oral BID   carvedilol  3.125 mg Oral BID WC   divalproex  1,000 mg Oral Daily   escitalopram  20 mg Oral Daily   feeding supplement (GLUCERNA SHAKE)  237 mL Oral TID BM   insulin aspart  0-15 Units Subcutaneous TID WC   insulin aspart  0-5 Units Subcutaneous QHS   insulin detemir  12 Units Subcutaneous BID   multivitamin with minerals  1 tablet Oral Daily   ramipril  5 mg Oral Daily   senna-docusate  1 tablet Oral QHS   thiamine  100 mg Oral Daily   vitamin B-12  1,000 mcg Oral Daily    Antimicrobials: Anti-infectives  (From admission, onward)    None       PRN meds: acetaminophen, acetaminophen, hydrALAZINE, ondansetron (ZOFRAN) IV   Objective: Vitals:   10/10/20 0735 10/10/20 0951  BP: (!) 141/58 (!) 130/58  Pulse: (!) 54 (!) 53  Resp: 18   Temp: 97.6 F (36.4 C)   SpO2: 100%     Intake/Output Summary (Last 24 hours) at 10/10/2020 1040 Last data filed at 10/09/2020 1649 Gross per 24 hour  Intake --  Output 100 ml  Net -100 ml    Filed Weights   09/30/20 1031  Weight: 74.5 kg   Weight change:  Body mass index is 26.5 kg/m.   Physical Exam: General exam: Pleasant, elderly African-American female.  Cheerfully demented.  Not in physical distress Skin: No rashes, lesions or ulcers. HEENT: Atraumatic, normocephalic, no obvious bleeding Lungs: Clear to auscultation bilaterally CVS: Regular rate and rhythm, no murmur GI/Abd soft, nontender, nondistended, bowel sound present CNS: Alert, awake, disoriented. Psychiatry: Cheerful Extremities: No pedal edema, no calf tenderness  Data Review: I have personally reviewed the laboratory data and studies available.  No results for input(s): WBC, NEUTROABS, HGB, HCT, MCV, PLT in the last 168 hours.  No results for input(s): NA, K, CL, CO2, GLUCOSE, BUN, CREATININE, CALCIUM, MG, PHOS in the last 168 hours.   F/u labs not needed Unresulted Labs (From admission, onward)    None       Signed, Terrilee Croak, MD [facility] 10/10/2020

## 2020-10-11 DIAGNOSIS — G9341 Metabolic encephalopathy: Secondary | ICD-10-CM | POA: Diagnosis not present

## 2020-10-11 LAB — CBC
HCT: 37.7 % (ref 36.0–46.0)
Hemoglobin: 12.4 g/dL (ref 12.0–15.0)
MCH: 29.6 pg (ref 26.0–34.0)
MCHC: 32.9 g/dL (ref 30.0–36.0)
MCV: 90 fL (ref 80.0–100.0)
Platelets: 169 10*3/uL (ref 150–400)
RBC: 4.19 MIL/uL (ref 3.87–5.11)
RDW: 13.6 % (ref 11.5–15.5)
WBC: 5.4 10*3/uL (ref 4.0–10.5)
nRBC: 0 % (ref 0.0–0.2)

## 2020-10-11 LAB — GLUCOSE, CAPILLARY
Glucose-Capillary: 129 mg/dL — ABNORMAL HIGH (ref 70–99)
Glucose-Capillary: 189 mg/dL — ABNORMAL HIGH (ref 70–99)
Glucose-Capillary: 175 mg/dL — ABNORMAL HIGH (ref 70–99)

## 2020-10-11 MED ORDER — POLYETHYLENE GLYCOL 3350 17 G PO PACK
17.0000 g | PACK | Freq: Every day | ORAL | Status: DC
  Administered 2020-10-11 – 2020-11-20 (×40): 17 g via ORAL
  Filled 2020-10-11 (×39): qty 1

## 2020-10-11 MED ORDER — MINERAL OIL RE ENEM
1.0000 | ENEMA | Freq: Every day | RECTAL | Status: DC | PRN
  Administered 2020-10-11 – 2020-11-12 (×2): 1 via RECTAL

## 2020-10-11 NOTE — Progress Notes (Signed)
PROGRESS NOTE  Daisy Watkins  DOB: 02/01/1933  PCP: Valerie Roys, DO UVO:[id number]  DOA: 09/30/2020  LOS: 10 days  Hospital Day: 12   Chief Complaint  Patient presents with   Altered Mental Status    Brief narrative: Daisy Watkins is a 87 y.o. female with PMH significant for chronic dementia with behavioral disturbances, Meningioma (09/14/20 MRI), HTN, DM2, CAD, Afib/DVT (on Eliquis). Patient presented to the ED on 7/3 with acute confusion, agitation with intermittent crying, and difficulty taking care of herself and taking home medications.   Patient was admitted for placement.  Subjective: Patient was seen and examined this morning.   Not in distress.  No new symptoms.  Constipated for last 10 days.  Assessment/Plan: Chronic Dementia with Behavioral Disturbances -Patient has had no episodes of agitation reported.  She has had no SI/HI. -Currently patient is alert, awake but not oriented to time place or person.  Not restless or agitated. -Continue Lexapro at 20 mg daily, Depakote to 1000 mg daily. -Continue Ativan 0.5 mg 3 times daily as needed for agitation, hydroxyzine 25 mg TID PRN for anxiety.  This can be increased to 50 mg if needed.   Left Anterior Lobe Meningioma -On Depakote for seizure control.   -Patient was evaluated by Neurosurgery on 09/14/20 admission and was deemed to be a poor surgical candidate. -She can follow up with Rad/Onc outpatient to consider radiotherapy.   Paroxysmal Atrial Fibrillation -Heart rate controlled on Coreg 3.125 mg twice daily.  -Continue Eliquis 5 mg BID for anticoagulation.   History of DVT, unprovoked -Continue Eliquis 5 mg BID for anticoagulation.   Essential Hypertension -Blood pressure controlled on Coreg and ramipril  Uncontrolled type 2 diabetes mellitus -A1c 10.2 on 09/14/2020 -Currently on Levemir 12 units twice daily along with sliding scale insulin and Accu-Cheks. -Blood sugar level improving last 24 hours, 145 this  morning. Recent Labs  Lab 10/10/20 0736 10/10/20 1140 10/10/20 1558 10/10/20 2123 10/11/20 0736  GLUCAP 145* 211* 134* 80 129*     Coronary Artery Disease -On Eliquis.  Not on statin because of inability to tolerate side effect   Constipation -Constipated for last 10 days.  Currently on Amitiza 24 mcg BID.  Start on MiraLAX daily and enema as needed.    Lactobacillus Urine Cuture -This is a normal flora of the female GU tract / contaminant.  Mobility: PT eval obtained Code Status:   Code Status: Full Code  Nutritional status: Body mass index is 26.72 kg/m. Nutrition Problem: Inadequate oral intake Etiology: poor appetite Signs/Symptoms: per patient/family report, meal completion < 50% Diet:  Diet Order             Diet Carb Modified Fluid consistency: Thin; Room service appropriate? Yes with Assist  Diet effective now                  DVT prophylaxis:   apixaban (ELIQUIS) tablet 5 mg   Antimicrobials: None Fluid: None Consultants: None Family Communication: None at bedside  Status is: Inpatient  Remains inpatient appropriate because: Patient needs placement  Dispo: The patient is from: Home              Anticipated d/c is to: Long-term placement              Patient currently is medically stable to d/c.   Difficult to place patient No     Infusions:    Scheduled Meds:  apixaban  5 mg Oral BID  carvedilol  3.125 mg Oral BID WC   divalproex  1,000 mg Oral Daily   escitalopram  20 mg Oral Daily   feeding supplement (GLUCERNA SHAKE)  237 mL Oral TID BM   insulin aspart  0-15 Units Subcutaneous TID WC   insulin aspart  0-5 Units Subcutaneous QHS   insulin detemir  12 Units Subcutaneous BID   multivitamin with minerals  1 tablet Oral Daily   polyethylene glycol  17 g Oral Daily   ramipril  5 mg Oral Daily   senna-docusate  1 tablet Oral QHS   thiamine  100 mg Oral Daily   vitamin B-12  1,000 mcg Oral Daily    Antimicrobials: Anti-infectives  (From admission, onward)    None       PRN meds: acetaminophen, acetaminophen, hydrALAZINE, mineral oil, ondansetron (ZOFRAN) IV   Objective: Vitals:   10/11/20 0601 10/11/20 0729  BP: 136/66 (!) 113/45  Pulse: (!) 57 (!) 50  Resp: 18 20  Temp: 98.1 F (36.7 C) 97.7 F (36.5 C)  SpO2: 96% 97%    Intake/Output Summary (Last 24 hours) at 10/11/2020 1043 Last data filed at 10/11/2020 1023 Gross per 24 hour  Intake 240 ml  Output 900 ml  Net -660 ml    Filed Weights   09/30/20 1031 10/11/20 0601  Weight: 74.5 kg 75.1 kg   Weight change:  Body mass index is 26.72 kg/m.   Physical Exam: General exam: Pleasant, elderly African-American female.  Cheerfully demented.  Not in physical distress Skin: No rashes, lesions or ulcers. HEENT: Atraumatic, normocephalic, no obvious bleeding Lungs: Clear to auscultation bilaterally CVS: Regular rate and rhythm, no murmur GI/Abd soft, nontender, nondistended, bowel sound present CNS: Alert, awake, disoriented. Psychiatry: Cheerful Extremities: No pedal edema, no calf tenderness  Data Review: I have personally reviewed the laboratory data and studies available.  Recent Labs  Lab 10/11/20 0454  WBC 5.4  HGB 12.4  HCT 37.7  MCV 90.0  PLT 169    No results for input(s): NA, K, CL, CO2, GLUCOSE, BUN, CREATININE, CALCIUM, MG, PHOS in the last 168 hours.   F/u labs not needed Unresulted Labs (From admission, onward)    None       Signed, Terrilee Croak, MD [facility] 10/11/2020

## 2020-10-11 NOTE — TOC Progression Note (Signed)
Transition of Care [facility]) - Progression Note    Patient Details  Name: Daisy Watkins MRN: [id number] Date of Birth: 1932/10/02  Transition of Care [facility]) CM/SW Contact  Shelbie Hutching, RN Phone Number: 10/11/2020, 11:14 AM  Clinical Narrative:    Medicaid application has been submitted and is pending.  RNCM has left a message with Tammy at [facility] to see if they may be willing to accept and LOG for the first month until Medicaid can be approved.    Expected Discharge Plan: Assisted Living Barriers to Discharge: Continued Medical Work up  Expected Discharge Plan and Services Expected Discharge Plan: Assisted Living   Discharge Planning Services: CM Consult   Living arrangements for the past 2 months: Single Family Home Expected Discharge Date: 10/03/20               DME Arranged: N/A DME Agency: NA       HH Arranged: NA           Social Determinants of Health (SDOH) Interventions    Readmission Risk Interventions No flowsheet data found.

## 2020-10-11 NOTE — Progress Notes (Signed)
Nutrition Follow-up  DOCUMENTATION CODES:  Not applicable  INTERVENTION:  Continue current diet as ordered  Glucerna Shake po TID, each supplement provides 220 kcal and 10 grams of protein Multivitamin with minerals daily  NUTRITION DIAGNOSIS:  Inadequate oral intake related to poor appetite as evidenced by per patient/family report, meal completion < 50%.  GOAL:  Patient will meet greater than or equal to 90% of their needs  MONITOR:  PO intake, Supplement acceptance, Weight trends  REASON FOR ASSESSMENT:  Malnutrition Screening Tool    ASSESSMENT:  87 y.o. female brought to ED from home with AMS. Family reports that pt was agitated earlier in the day and then became unresponsive. PMH relevant for DM, GERD, HTN, HLD, Vitamin B12 and D deficiencies, and atrial fibrillation.  Pt recently admitted for AMS and imaging obtained during that admission showed L anterior temporal meningioma with associated vasogenic edema. No significant change in size from last admission when re-imaged. Neurology and Psychiatry consulting.   Pt resting in bed at the time of visit, eating lunch. Pt minimally conversant today, but will give one word answers. Denies GI distress today and reports she does like the supplements she is receiving.  Noted no BM recorded since 7/4. Confirmed with RN that this is likely accurate. MD placed additional bowel regimen orders.  Average Meal Intake: 7/3-7/5: 0% intake x 1 recorded meal 7/6-7/14: 15% intake x 23 recorded meals (0-100%)  Nutritionally Relevant Medications: Scheduled Meds:  feeding supplement (GLUCERNA SHAKE)  237 mL Oral TID BM   insulin aspart  0-15 Units Subcutaneous TID WC   insulin aspart  0-5 Units Subcutaneous QHS   insulin detemir  12 Units Subcutaneous BID   multivitamin with minerals  1 tablet Oral Daily   senna-docusate  1 tablet Oral QHS   thiamine  100 mg Oral Daily   vitamin B-12  1,000 mcg Oral Daily   PRN Meds: ondansetron    Labs Reviewed: SBG ranges from 80-211 mg/dL over the last 24 hours HgbA1c 10.2% (6/17)  NUTRITION - FOCUSED PHYSICAL EXAM: Flowsheet Row Most Recent Value  Orbital Region No depletion  Upper Arm Region No depletion  Thoracic and Lumbar Region No depletion  Buccal Region No depletion  Temple Region No depletion  Clavicle Bone Region No depletion  Clavicle and Acromion Bone Region No depletion  Scapular Bone Region No depletion  Dorsal Hand No depletion  Patellar Region No depletion  Anterior Thigh Region No depletion  Posterior Calf Region No depletion  Edema (RD Assessment) Mild  Hair Reviewed  Eyes Reviewed  Mouth Reviewed  Skin Reviewed  Nails Reviewed   Diet Order:   Diet Order             Diet Carb Modified Fluid consistency: Thin; Room service appropriate? Yes with Assist  Diet effective now                   EDUCATION NEEDS:  No education needs have been identified at this time  Skin:  Skin Assessment: Reviewed RN Assessment  Last BM:  10/01/20 - OBR in place   Height:  Ht Readings from Last 1 Encounters:  09/30/20 5\' 6"  (1.676 m)    Weight:  Wt Readings from Last 1 Encounters:  10/11/20 75.1 kg    Ideal Body Weight:  59.1 kg  BMI:  Body mass index is 26.72 kg/m.  Estimated Nutritional Needs:  Kcal:  1600-1800 kcal/d Protein:  80-90 g/d Fluid:  1.8-2L/d   Ranell Patrick,  RD, LDN Clinical Dietitian Pager on [facility]

## 2020-10-11 NOTE — Progress Notes (Addendum)
Physical Therapy Treatment Patient Details Name: Daisy Watkins MRN: [id number] DOB: 06-Jun-1932 Today's Date: 10/11/2020    History of Present Illness Pt is an 87 y/o admitted on 09/30/20 with c/c of AMS with confusion and agitation. Pt recently d/c on 09/15/20 after being admitted for change in mental status. PMH: anxiety, DVT, DM, GERD, insomnia, HTN, HLD, insomnia, myalgia.    PT Comments    Patient alert, oriented to self and place, disoriented to situation and time. Pt not agreeable to any mobility/OOB tasks despite education, facilitation, or encouragement. Did perform supine exercises with multimodal cues. Pt left in bed, with all needs in reach. The patient has demonstrated limited progress with PT as well as limited participation, PT to continue to assess pt for appropriateness of services.      Follow Up Recommendations  Other (comment);Supervision/Assistance - 24 hour;Supervision for mobility/OOB     Equipment Recommendations  None recommended by PT    Recommendations for Other Services       Precautions / Restrictions Precautions Precautions: None Restrictions Weight Bearing Restrictions: No    Mobility  Bed Mobility               General bed mobility comments: pt declined mobility/OOB throughout session despite education, encouragement, and re-direction, as well as despite offered functional tasks (teeth brushing, bathroom, sitting up to eat, etc)    Transfers                    Ambulation/Gait                 Stairs             Wheelchair Mobility    Modified Rankin (Stroke Patients Only)       Balance                                            Cognition Arousal/Alertness: Awake/alert Behavior During Therapy: Flat affect Overall Cognitive Status: No family/caregiver present to determine baseline cognitive functioning Area of Impairment: Attention;Following commands;Safety/judgement;Awareness;Orientation                  Orientation Level: Disoriented to;Time     Following Commands: Follows one step commands inconsistently              Exercises General Exercises - Lower Extremity Ankle Circles/Pumps: AROM;10 reps Short Arc Quad: AROM;Strengthening;Both;10 reps Heel Slides: AROM;10 reps;Strengthening;Both Hip ABduction/ADduction: AROM;10 reps;Strengthening;Both Shoulder Exercises Shoulder Flexion: AROM;Strengthening;Both;5 reps Elbow Flexion: AROM;Strengthening;Both;5 reps Elbow Extension: AROM;Strengthening;Both;5 reps    General Comments        Pertinent Vitals/Pain Pain Assessment: Faces Faces Pain Scale: No hurt    Home Living                      Prior Function            PT Goals (current goals can now be found in the care plan section) Progress towards PT goals: Not progressing toward goals - comment (limited due to patient cognition)    Frequency    Min 2X/week      PT Plan Current plan remains appropriate    Co-evaluation              AM-PAC PT "6 Clicks" Mobility   Outcome Measure  Help needed turning from your back to your side while in  a flat bed without using bedrails?: None Help needed moving from lying on your back to sitting on the side of a flat bed without using bedrails?: None Help needed moving to and from a bed to a chair (including a wheelchair)?: A Little Help needed standing up from a chair using your arms (e.g., wheelchair or bedside chair)?: A Little Help needed to walk in hospital room?: A Little Help needed climbing 3-5 steps with a railing? : A Little 6 Click Score: 20    End of Session   Activity Tolerance: Other (comment) (limited by cognitive status, pt self direction of care) Patient left: in bed;with bed alarm set;with call bell/phone within reach Nurse Communication: Mobility status PT Visit Diagnosis: Unsteadiness on feet (R26.81)     Time: 6861-6837 PT Time Calculation (min) (ACUTE ONLY):  12 min  Charges:  $Therapeutic Exercise: 8-22 mins                     Lieutenant Diego PT, DPT 11:40 AM,10/11/20

## 2020-10-12 DIAGNOSIS — G9341 Metabolic encephalopathy: Secondary | ICD-10-CM | POA: Diagnosis not present

## 2020-10-12 LAB — GLUCOSE, CAPILLARY
Glucose-Capillary: 104 mg/dL — ABNORMAL HIGH (ref 70–99)
Glucose-Capillary: 150 mg/dL — ABNORMAL HIGH (ref 70–99)
Glucose-Capillary: 182 mg/dL — ABNORMAL HIGH (ref 70–99)
Glucose-Capillary: 137 mg/dL — ABNORMAL HIGH (ref 70–99)

## 2020-10-12 MED ORDER — MAGNESIUM CITRATE PO SOLN
1.0000 | Freq: Once | ORAL | Status: DC
  Filled 2020-10-12: qty 296

## 2020-10-12 MED ORDER — MAGNESIUM CITRATE PO SOLN
1.0000 | Freq: Once | ORAL | Status: AC
  Administered 2020-10-12: 06:00:00 1 via ORAL
  Filled 2020-10-12: qty 296

## 2020-10-12 NOTE — Plan of Care (Signed)
  Problem: Nutrition: Goal: Adequate nutrition will be maintained Outcome: Progressing   Problem: Safety: Goal: Ability to remain free from injury will improve Outcome: Progressing   

## 2020-10-12 NOTE — Progress Notes (Signed)
   10/12/20 1215  Clinical Encounter Type  Visited With Patient  Visit Type Initial;Spiritual support;Social support  Referral From Nurse  Consult/Referral To [place] responded to an RR page. Chaplain ministered with presence. PT was actively being medically assessed, so the chaplain checked in, and said he will try to follow back up later.

## 2020-10-12 NOTE — Progress Notes (Addendum)
Pt have not have a bowel movement since yesterday with intervention on MAR. MD Mansy made aware. Will continue to monitor.  Update 0541: MD Mansy ordered mag citrate solution 1 bottle oral once. Will continue to monitor.

## 2020-10-12 NOTE — Progress Notes (Signed)
PROGRESS NOTE  Daisy Watkins  DOB: [birth year]  PCP: Valerie Roys, DO CLE:[id number]  DOA: 09/30/2020  LOS: 11 days  Hospital Day: 55   Chief Complaint  Patient presents with   Altered Mental Status    Brief narrative: Daisy Watkins is a 87 y.o. female with PMH significant for chronic dementia with behavioral disturbances, Meningioma (09/14/20 MRI), HTN, DM2, CAD, Afib/DVT (on Eliquis). Patient presented to the ED on 7/3 with acute confusion, agitation with intermittent crying, and difficulty taking care of herself and taking home medications.   Patient was admitted for placement.  Subjective: Patient was seen and examined this morning.   Not in distress.  No new symptoms. Per nursing report, patient has not had a bowel movement in the last several days.  Assessment/Plan: Chronic Dementia with Behavioral Disturbances -Patient has had no episodes of agitation reported.  She has had no SI/HI. -Currently patient is alert, awake but not oriented to time place or person.  Not restless or agitated. -Continue Lexapro at 20 mg daily, Depakote to 1000 mg daily. -Continue Ativan 0.5 mg 3 times daily as needed for agitation, hydroxyzine 25 mg TID PRN for anxiety.  This can be increased to 50 mg if needed.   Left Anterior Lobe Meningioma -On Depakote for seizure control.   -Patient was evaluated by Neurosurgery on 09/14/20 admission and was deemed to be a poor surgical candidate. -She can follow up with Rad/Onc outpatient to consider radiotherapy.   Paroxysmal Atrial Fibrillation -Heart rate controlled on Coreg 3.125 mg twice daily.  -Continue Eliquis 5 mg BID for anticoagulation.   History of DVT, unprovoked -Continue Eliquis 5 mg BID for anticoagulation.   Essential Hypertension -Blood pressure controlled on Coreg and ramipril  Uncontrolled type 2 diabetes mellitus -A1c 10.2 on 09/14/2020 -Currently on Levemir 12 units twice daily along with sliding scale insulin and  Accu-Cheks. -Blood sugar level improving last 24 hours, 145 this morning. Recent Labs  Lab 10/10/20 2123 10/11/20 0736 10/11/20 1611 10/11/20 2006 10/12/20 0801  GLUCAP 80 129* 189* 175* 104*     Coronary Artery Disease -On Eliquis.  Not on statin because of inability to tolerate side effect   Constipation -Constipated for last 10 days.  Currently on Amitiza 24 mcg BID.  Start on MiraLAX daily and enema as needed.    Lactobacillus Urine Cuture -This is a normal flora of the female GU tract / contaminant.  Mobility: PT eval obtained Code Status:   Code Status: Full Code  Nutritional status: Body mass index is 26.72 kg/m. Nutrition Problem: Inadequate oral intake Etiology: poor appetite Signs/Symptoms: per patient/family report, meal completion < 50% Diet:  Diet Order             Diet Carb Modified Fluid consistency: Thin; Room service appropriate? Yes with Assist  Diet effective now                  DVT prophylaxis:   apixaban (ELIQUIS) tablet 5 mg   Antimicrobials: None Fluid: None Consultants: None Family Communication: None at bedside  Status is: Inpatient  Remains inpatient appropriate because: Patient needs placement  Dispo: The patient is from: Home              Anticipated d/c is to: Long-term placement              Patient currently is medically stable to d/c.   Difficult to place patient No     Infusions:    Scheduled  Meds:  apixaban  5 mg Oral BID   carvedilol  3.125 mg Oral BID WC   divalproex  1,000 mg Oral Daily   escitalopram  20 mg Oral Daily   feeding supplement (GLUCERNA SHAKE)  237 mL Oral TID BM   insulin aspart  0-15 Units Subcutaneous TID WC   insulin aspart  0-5 Units Subcutaneous QHS   insulin detemir  12 Units Subcutaneous BID   multivitamin with minerals  1 tablet Oral Daily   polyethylene glycol  17 g Oral Daily   ramipril  5 mg Oral Daily   senna-docusate  1 tablet Oral QHS   thiamine  100 mg Oral Daily   vitamin  B-12  1,000 mcg Oral Daily    Antimicrobials: Anti-infectives (From admission, onward)    None       PRN meds: acetaminophen, acetaminophen, hydrALAZINE, mineral oil, ondansetron (ZOFRAN) IV   Objective: Vitals:   10/12/20 0547 10/12/20 0800  BP: (!) 138/55 (!) 134/55  Pulse: (!) 59 (!) 57  Resp: 18 16  Temp: 98.1 F (36.7 C) 98.2 F (36.8 C)  SpO2: 96% 97%    Intake/Output Summary (Last 24 hours) at 10/12/2020 1012 Last data filed at 10/12/2020 0628 Gross per 24 hour  Intake 1013 ml  Output --  Net 1013 ml    Filed Weights   09/30/20 1031 10/11/20 0601  Weight: 74.5 kg 75.1 kg   Weight change:  Body mass index is 26.72 kg/m.   Physical Exam: General exam: Pleasant, elderly African-American female.  Cheerfully demented.  Not in physical distress Skin: No rashes, lesions or ulcers. HEENT: Atraumatic, normocephalic, no obvious bleeding Lungs: Clear to auscultation bilaterally CVS: Regular rate and rhythm, no murmur GI/Abd soft, nontender, nondistended, bowel sound present CNS: Alert, awake, disoriented. Psychiatry: Cheerful Extremities: No pedal edema, no calf tenderness  Data Review: I have personally reviewed the laboratory data and studies available.  Recent Labs  Lab 10/11/20 0454  WBC 5.4  HGB 12.4  HCT 37.7  MCV 90.0  PLT 169    No results for input(s): NA, K, CL, CO2, GLUCOSE, BUN, CREATININE, CALCIUM, MG, PHOS in the last 168 hours.   F/u labs not needed Unresulted Labs (From admission, onward)    None       Signed, Terrilee Croak, MD [facility] 10/12/2020

## 2020-10-12 NOTE — TOC Progression Note (Signed)
Transition of Care [facility]) - Progression Note    Patient Details  Name: Daisy Watkins MRN: [id number] Date of Birth: 1932-10-21  [facility] CM/SW Contact  Shelbie Hutching, RN Phone Number: 10/12/2020, 10:28 AM  Clinical Narrative:    Lynelle Smoke from [facility] is here to assess patient for their memory care unit.  Patient will not interact.  Patient refuses to speak or engage.  Tammy unable to assess patient for appropriateness.  Per [place] family of patient is also not cooperating with DSS and providing necessary documents for the Medicaid application.  [facility] is happy to accept patient and accept the LOG but they also need to make sure that they will get paid after the LOG expires in 30 days and if family will not cooperate then Medicaid will not get approved.  TOC will cont to follow.    Expected Discharge Plan: Assisted Living Barriers to Discharge: Continued Medical Work up  Expected Discharge Plan and Services Expected Discharge Plan: Assisted Living   Discharge Planning Services: CM Consult   Living arrangements for the past 2 months: Single Family Home Expected Discharge Date: 10/03/20               DME Arranged: N/A DME Agency: NA       HH Arranged: NA           Social Determinants of Health (SDOH) Interventions    Readmission Risk Interventions No flowsheet data found.

## 2020-10-13 DIAGNOSIS — G9341 Metabolic encephalopathy: Secondary | ICD-10-CM | POA: Diagnosis not present

## 2020-10-13 LAB — GLUCOSE, CAPILLARY
Glucose-Capillary: 91 mg/dL (ref 70–99)
Glucose-Capillary: 200 mg/dL — ABNORMAL HIGH (ref 70–99)
Glucose-Capillary: 204 mg/dL — ABNORMAL HIGH (ref 70–99)
Glucose-Capillary: 122 mg/dL — ABNORMAL HIGH (ref 70–99)

## 2020-10-13 MED ORDER — HYDRALAZINE HCL 50 MG PO TABS
25.0000 mg | ORAL_TABLET | ORAL | Status: DC | PRN
  Administered 2020-10-13: 21:00:00 25 mg via ORAL
  Filled 2020-10-13: qty 1

## 2020-10-13 NOTE — Progress Notes (Signed)
PROGRESS NOTE  Daisy Watkins  DOB: [birth year]  PCP: Valerie Roys, DO OVF:[id number]  DOA: 09/30/2020  LOS: 12 days  Hospital Day: 42   Chief Complaint  Patient presents with   Altered Mental Status    Brief narrative: STEFFANI Watkins is a 87 y.o. female with PMH significant for chronic dementia with behavioral disturbances, Meningioma (09/14/20 MRI), HTN, DM2, CAD, Afib/DVT (on Eliquis). Patient presented to the ED on 7/3 with acute confusion, agitation with intermittent crying, and difficulty taking care of herself and taking home medications.   Patient was admitted for placement.  Subjective: Patient was seen and examined this morning.   Not in distress.  No new symptoms.  Assessment/Plan: Chronic Dementia with Behavioral Disturbances -Patient has had no episodes of agitation reported.  She has had no SI/HI. -Currently patient is alert, awake but not oriented to time place or person.  Not restless or agitated. -Continue Lexapro at 20 mg daily, Depakote to 1000 mg daily. -Continue Ativan 0.5 mg 3 times daily as needed for agitation, hydroxyzine 25 mg TID PRN for anxiety.  This can be increased to 50 mg if needed.   Left Anterior Lobe Meningioma -On Depakote for seizure control.   -Patient was evaluated by Neurosurgery on 09/14/20 admission and was deemed to be a poor surgical candidate. -She can follow up with Rad/Onc outpatient to consider radiotherapy.   Paroxysmal Atrial Fibrillation -Heart rate controlled on Coreg 3.125 mg twice daily.  Patient was persistently bradycardic to 50s.  I stopped her Coreg today.  Continue to monitor. -Continue Eliquis 5 mg BID for anticoagulation.   History of DVT, unprovoked -Continue Eliquis 5 mg BID for anticoagulation.   Essential Hypertension -Blood pressure controlled on Coreg and ramipril  Uncontrolled type 2 diabetes mellitus -A1c 10.2 on 09/14/2020 -Currently on Levemir 12 units twice daily along with sliding scale insulin and  Accu-Cheks. -Blood sugar level improving last 24 hours, 145 this morning. Recent Labs  Lab 10/12/20 0801 10/12/20 1202 10/12/20 1624 10/12/20 2121 10/13/20 0744  GLUCAP 104* 150* 182* 137* 91     Coronary Artery Disease -On Eliquis.  Not on statin because of inability to tolerate side effect   Constipation -Was constipated for 10 days.  Had a good bowel movement on 7/15.  Almost had a vasovagal syncope with that.  Currently on Amitiza 24 mcg BID.   Lactobacillus Urine Cuture -This is a normal flora of the female GU tract / contaminant.  Mobility: PT eval obtained Code Status:   Code Status: Full Code  Nutritional status: Body mass index is 26.72 kg/m. Nutrition Problem: Inadequate oral intake Etiology: poor appetite Signs/Symptoms: per patient/family report, meal completion < 50% Diet:  Diet Order             Diet Carb Modified Fluid consistency: Thin; Room service appropriate? Yes  Diet effective now                  DVT prophylaxis:   apixaban (ELIQUIS) tablet 5 mg   Antimicrobials: None Fluid: None Consultants: None Family Communication: None at bedside  Status is: Inpatient  Remains inpatient appropriate because: Patient needs placement  Dispo: The patient is from: Home              Anticipated d/c is to: Long-term placement              Patient currently is medically stable to d/c.   Difficult to place patient No  Infusions:    Scheduled Meds:  apixaban  5 mg Oral BID   divalproex  1,000 mg Oral Daily   escitalopram  20 mg Oral Daily   feeding supplement (GLUCERNA SHAKE)  237 mL Oral TID BM   insulin aspart  0-15 Units Subcutaneous TID WC   insulin aspart  0-5 Units Subcutaneous QHS   insulin detemir  12 Units Subcutaneous BID   multivitamin with minerals  1 tablet Oral Daily   polyethylene glycol  17 g Oral Daily   ramipril  5 mg Oral Daily   senna-docusate  1 tablet Oral QHS   thiamine  100 mg Oral Daily   vitamin B-12  1,000 mcg  Oral Daily    Antimicrobials: Anti-infectives (From admission, onward)    None       PRN meds: acetaminophen, acetaminophen, hydrALAZINE, mineral oil, ondansetron (ZOFRAN) IV   Objective: Vitals:   10/13/20 0604 10/13/20 0743  BP: (!) 131/54 (!) 144/60  Pulse: (!) 51 (!) 58  Resp: 16 15  Temp: 97.8 F (36.6 C) 98.1 F (36.7 C)  SpO2: 96% 97%    Intake/Output Summary (Last 24 hours) at 10/13/2020 1130 Last data filed at 10/13/2020 1005 Gross per 24 hour  Intake 477 ml  Output --  Net 477 ml    Filed Weights   09/30/20 1031 10/11/20 0601  Weight: 74.5 kg 75.1 kg   Weight change:  Body mass index is 26.72 kg/m.   Physical Exam: General exam: Pleasant, elderly African-American female.  Cheerfully demented.  Not in physical distress Skin: No rashes, lesions or ulcers. HEENT: Atraumatic, normocephalic, no obvious bleeding Lungs: Clear to auscultation bilaterally CVS: Regular rate and rhythm, no murmur GI/Abd soft, nontender, nondistended, bowel sound present CNS: Alert, awake, disoriented. Psychiatry: Cheerful Extremities: No pedal edema, no calf tenderness  Data Review: I have personally reviewed the laboratory data and studies available.  Recent Labs  Lab 10/11/20 0454  WBC 5.4  HGB 12.4  HCT 37.7  MCV 90.0  PLT 169    No results for input(s): NA, K, CL, CO2, GLUCOSE, BUN, CREATININE, CALCIUM, MG, PHOS in the last 168 hours.   F/u labs not needed Unresulted Labs (From admission, onward)    None       Signed, Terrilee Croak, MD [facility] 10/13/2020

## 2020-10-14 ENCOUNTER — Inpatient Hospital Stay: Payer: Medicare HMO

## 2020-10-14 DIAGNOSIS — G9341 Metabolic encephalopathy: Secondary | ICD-10-CM | POA: Diagnosis not present

## 2020-10-14 LAB — GLUCOSE, CAPILLARY
Glucose-Capillary: 96 mg/dL (ref 70–99)
Glucose-Capillary: 207 mg/dL — ABNORMAL HIGH (ref 70–99)
Glucose-Capillary: 190 mg/dL — ABNORMAL HIGH (ref 70–99)
Glucose-Capillary: 140 mg/dL — ABNORMAL HIGH (ref 70–99)

## 2020-10-14 IMAGING — CT CT HEAD W/O CM
3 series · 16 of 47 positions shown, 19 images · non-contrast
Comparison: CT head [DATE].  MRI head with contrast [DATE]

CLINICAL DATA: Delirium and lethargy.

EXAM:
CT HEAD WITHOUT CONTRAST
TECHNIQUE: Contiguous axial images were obtained from the base of the skull
through the vertex without intravenous contrast.

[Series 2: head wo · axial · 0.41mm/px · z∈[+191,+326]mm · 10 of 33 slices shown, 13 images]
[im 3/33  brain]
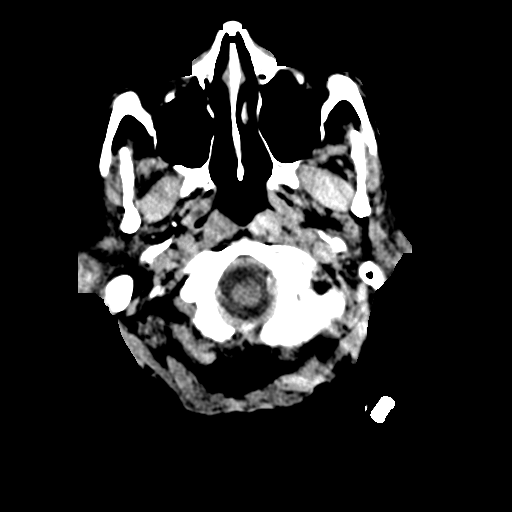
[im 3/33  bone]
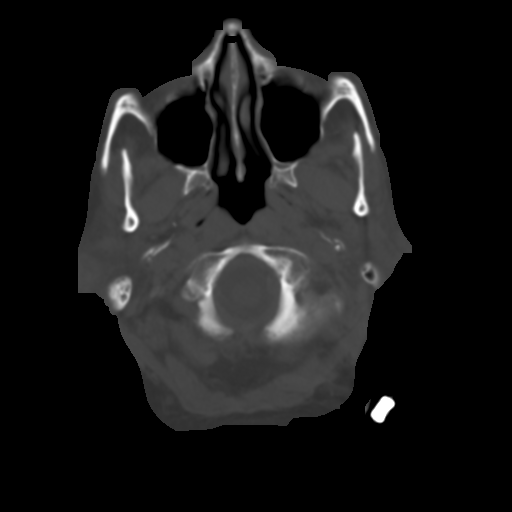
[im 6/33  brain]
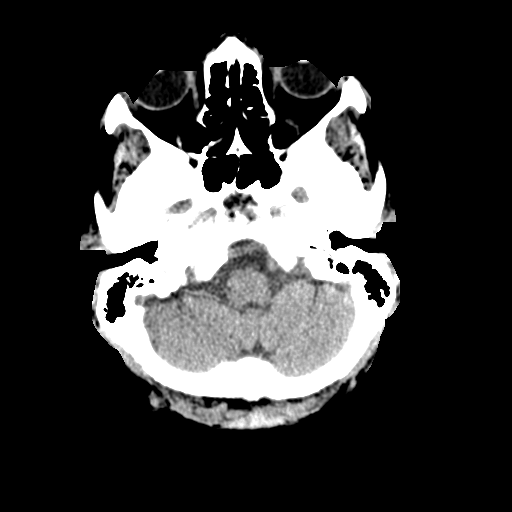
[im 9/33  brain]
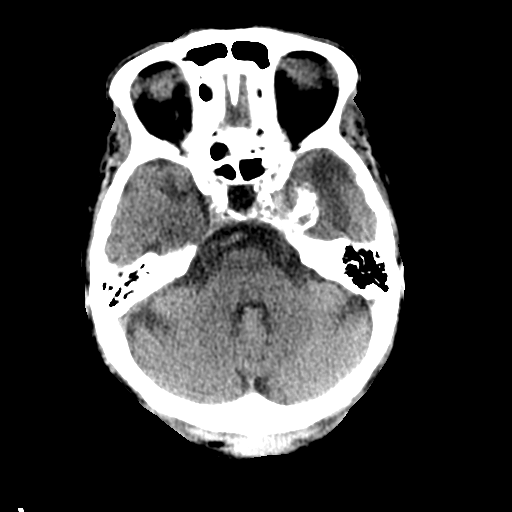
[im 12/33  brain]
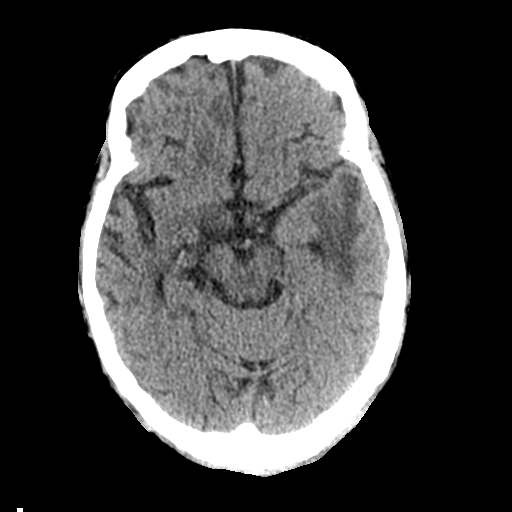
[im 15/33  brain]
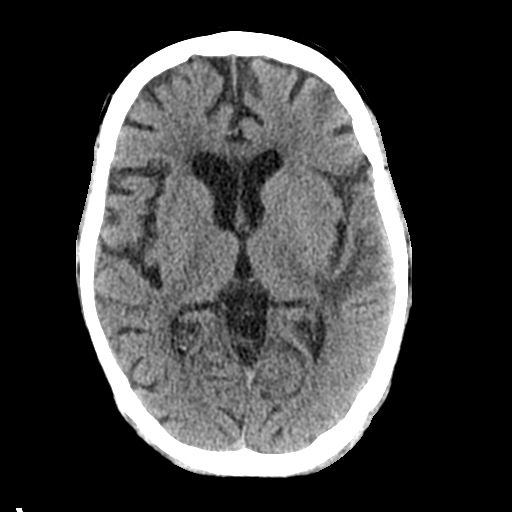
[im 15/33  bone]
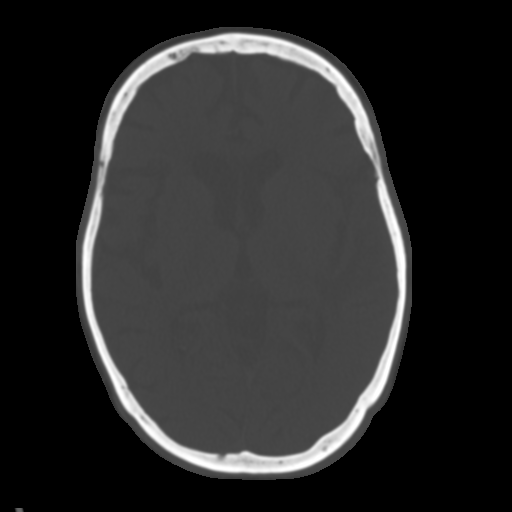
[im 18/33  brain]
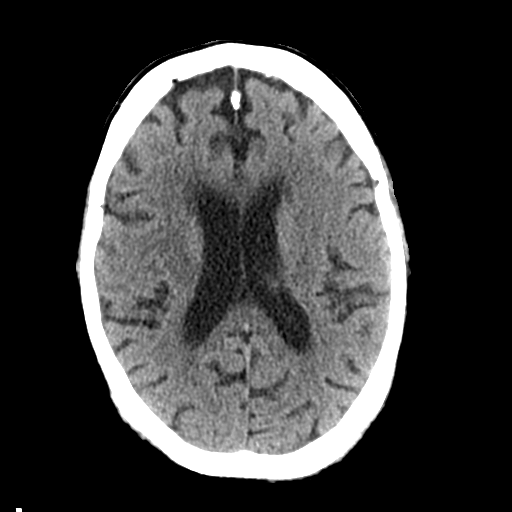
[im 21/33  brain]
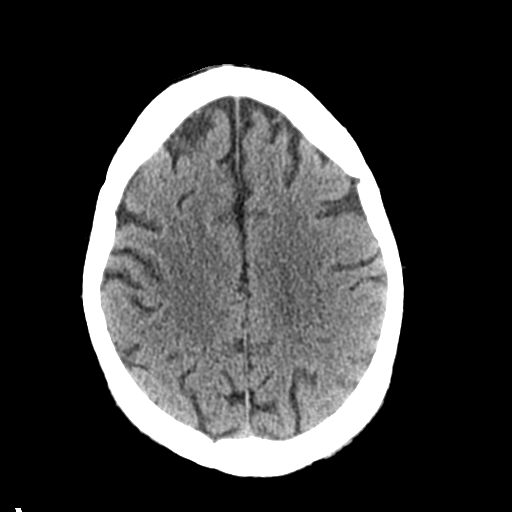
[im 25/33  brain]
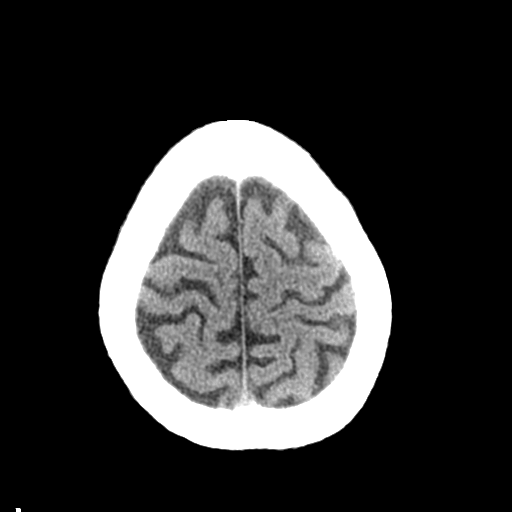
[im 27/33  brain]
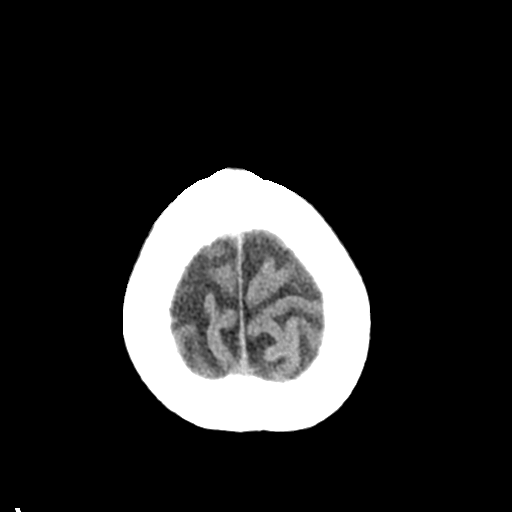
[im 27/33  bone]
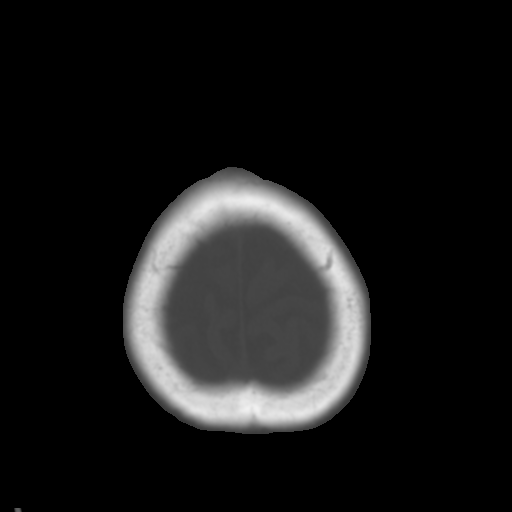
[im 30/33  brain]
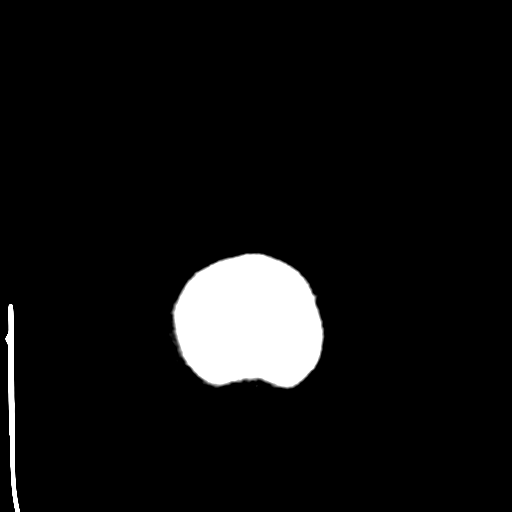

[Series 4: coronal soft tissue · coronal · 0.32mm/px · 3 of 68 slices shown]
[im 23/68  brain]
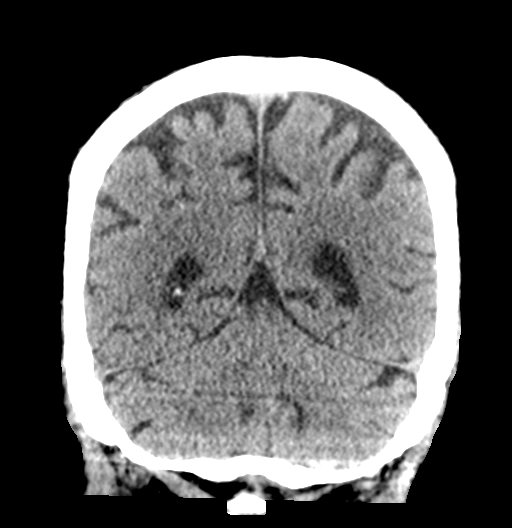
[im 30/68  brain]
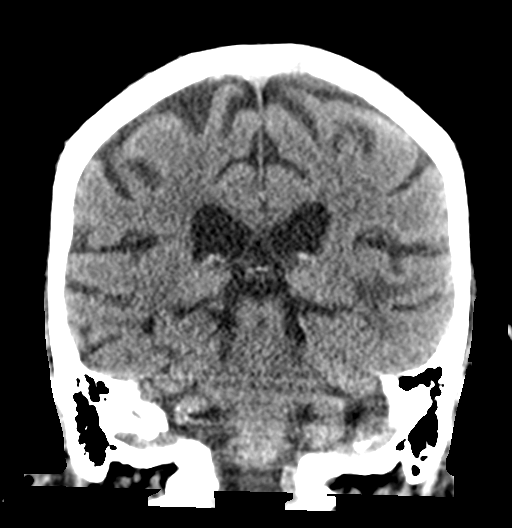
[im 38/68  brain]
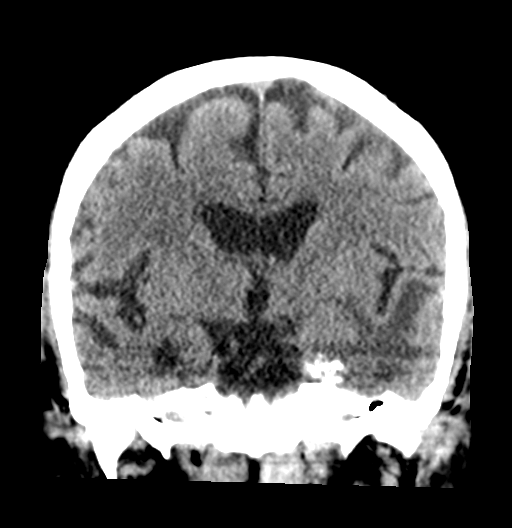

[Series 5: sagittal soft tissue · sagittal · 0.33mm/px · 3 of 54 slices shown]
[im 18/54  brain]
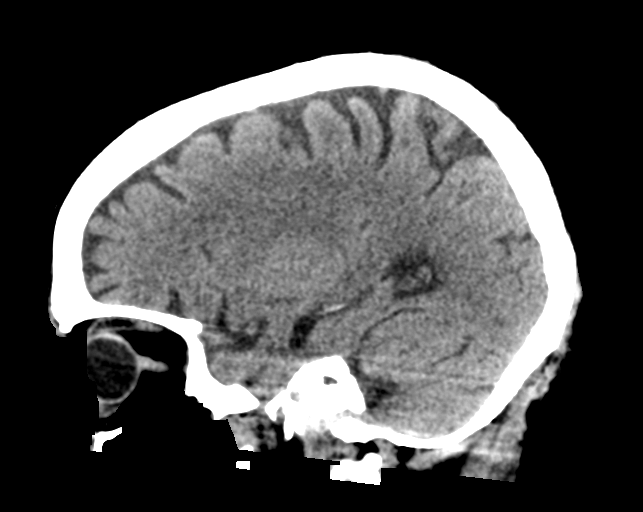
[im 27/54  brain]
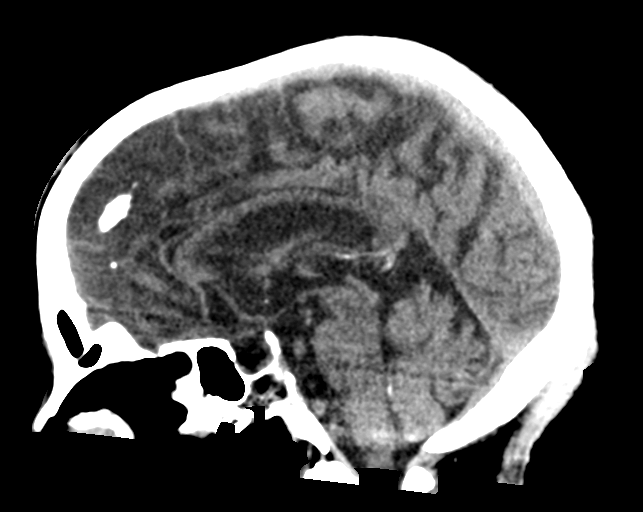
[im 36/54  brain]
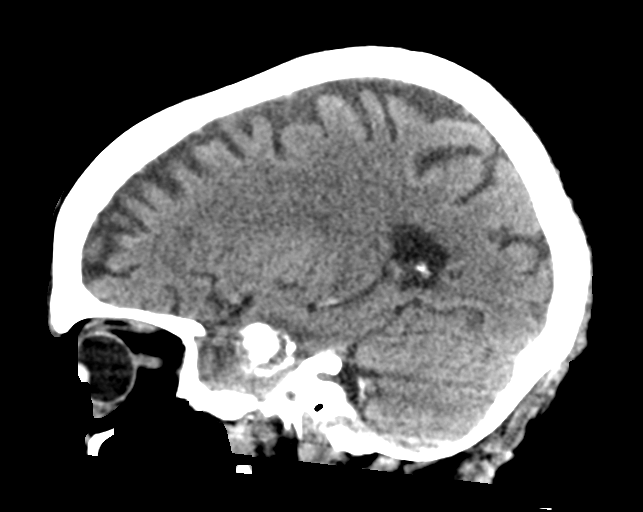

[16 of 47 positions shown; findings below may reference images not displayed]

FINDINGS: Brain: Densely calcified mass in the medial middle cranial fossa on
the left measures 20 mm in diameter. This appears associated with
the cavernous sinus. There is moderate amount of white matter edema
in the left temporal lobe, unchanged from prior studies.

Generalized atrophy. Ventricle size normal. No midline shift.
Negative for acute infarct. Negative for hemorrhage.

Vascular: Negative for hyperdense vessel

Skull: Negative

Sinuses/Orbits: Negative

Other: None
IMPRESSION: Stable head CT compared with recent studies

20 mm left cavernous sinus calcified mass compatible with meningioma
with edema in the left temporal lobe.

## 2020-10-14 NOTE — Progress Notes (Signed)
PROGRESS NOTE  [name]  DOB: 06/06/1932  PCP: Valerie Roys, DO WYO:[id number]  DOA: 09/30/2020  LOS: 62 days  Hospital Day: 71   Chief Complaint  Patient presents with   Altered Mental Status    Brief narrative: Daisy Watkins is a 87 y.o. female with PMH significant for chronic dementia with behavioral disturbances, Meningioma (09/14/20 MRI), HTN, DM2, CAD, Afib/DVT (on Eliquis). Patient presented to the ED on 7/3 with acute confusion, agitation with intermittent crying, and difficulty taking care of herself and taking home medications.   Patient was admitted for placement.  Subjective: Patient was seen and examined this morning.   Elderly African-American female.  Lying on bed.  Not in distress.  Alert, awake, able to follow motor commands.  Early in the morning, she was shaking, showing persistently spitting like behavior.  At the time of my evaluation, patient was cooperative and did not show those behaviors.  No focal deficits noted on examination.  Assessment/Plan: Chronic Dementia with Behavioral Disturbances -Patient has had no episodes of agitation reported.  She has had no SI/HI. -Currently patient is alert, awake but not oriented to time place or person.  Not restless or agitated. -Continue Lexapro at 20 mg daily, Depakote to 1000 mg daily. -Continue Ativan 0.5 mg 3 times daily as needed for agitation, hydroxyzine 25 mg TID PRN for anxiety.  This can be increased to 50 mg if needed.   Left Anterior Lobe Meningioma -On Depakote for seizure control.   -Patient was evaluated by Neurosurgery on 09/14/20 admission and was deemed to be a poor surgical candidate. -She can follow up with Rad/Onc outpatient to consider radiotherapy.   Paroxysmal Atrial Fibrillation -Heart rate controlled on Coreg 3.125 mg twice daily.  Patient was persistently bradycardic to 50s.  7/16, Coreg was stopped.  Continue to monitor. -Continue Eliquis 5 mg BID for anticoagulation.   History of  DVT, unprovoked -Continue Eliquis 5 mg BID for anticoagulation.   Essential Hypertension -Blood pressure controlled on Coreg and ramipril  Uncontrolled type 2 diabetes mellitus -A1c 10.2 on 09/14/2020 -Currently on Levemir 12 units twice daily along with sliding scale insulin and Accu-Cheks. -Blood sugar level overall stable. Recent Labs  Lab 10/13/20 0744 10/13/20 1138 10/13/20 1823 10/13/20 2049 10/14/20 0723  GLUCAP 91 200* 204* 122* 96     Coronary Artery Disease -On Eliquis.  Not on statin because of inability to tolerate side effect   Constipation -Was constipated for 10 days.  Had a good bowel movement on 7/15.  Almost had a vasovagal syncope with that.  Currently on Amitiza 24 mcg BID.   Lactobacillus Urine Cuture -This is a normal flora of the female GU tract / contaminant.  Mobility: PT eval obtained Code Status:   Code Status: Full Code  Nutritional status: Body mass index is 26.72 kg/m. Nutrition Problem: Inadequate oral intake Etiology: poor appetite Signs/Symptoms: per patient/family report, meal completion < 50% Diet:  Diet Order             Diet Carb Modified Fluid consistency: Thin; Room service appropriate? Yes  Diet effective now                  DVT prophylaxis:   apixaban (ELIQUIS) tablet 5 mg   Antimicrobials: None Fluid: None Consultants: None Family Communication: None at bedside  Status is: Inpatient  Remains inpatient appropriate because: Patient needs placement  Dispo: The patient is from: Home  Anticipated d/c is to: Long-term placement              Patient currently is medically stable to d/c.   Difficult to place patient No     Infusions:    Scheduled Meds:  apixaban  5 mg Oral BID   divalproex  1,000 mg Oral Daily   escitalopram  20 mg Oral Daily   feeding supplement (GLUCERNA SHAKE)  237 mL Oral TID BM   insulin aspart  0-15 Units Subcutaneous TID WC   insulin aspart  0-5 Units Subcutaneous QHS    insulin detemir  12 Units Subcutaneous BID   multivitamin with minerals  1 tablet Oral Daily   polyethylene glycol  17 g Oral Daily   ramipril  5 mg Oral Daily   senna-docusate  1 tablet Oral QHS   thiamine  100 mg Oral Daily   vitamin B-12  1,000 mcg Oral Daily    Antimicrobials: Anti-infectives (From admission, onward)    None       PRN meds: acetaminophen, acetaminophen, hydrALAZINE, mineral oil, ondansetron (ZOFRAN) IV   Objective: Vitals:   10/14/20 0722 10/14/20 0751  BP: (!) 152/55 (!) 164/64  Pulse: (!) 51 (!) 52  Resp: 18   Temp: 98.2 F (36.8 C)   SpO2: 97% 98%    Intake/Output Summary (Last 24 hours) at 10/14/2020 1011 Last data filed at 10/13/2020 2115 Gross per 24 hour  Intake 150 ml  Output --  Net 150 ml    Filed Weights   09/30/20 1031 10/11/20 0601  Weight: 74.5 kg 75.1 kg   Weight change:  Body mass index is 26.72 kg/m.   Physical Exam: General exam: Pleasant, elderly African-American female.  Not in distress.  Skin: No rashes, lesions or ulcers. HEENT: Atraumatic, normocephalic, no obvious bleeding Lungs: Clear to auscultation bilaterally CVS: Regular rate and rhythm, no murmur GI/Abd soft, nontender, nondistended, bowel sound present CNS: Alert, awake, disoriented.  Not restless or agitated.  Able to follow commands. Psychiatry: Cheerful Extremities: No pedal edema, no calf tenderness  Data Review: I have personally reviewed the laboratory data and studies available.  Recent Labs  Lab 10/11/20 0454  WBC 5.4  HGB 12.4  HCT 37.7  MCV 90.0  PLT 169    No results for input(s): NA, K, CL, CO2, GLUCOSE, BUN, CREATININE, CALCIUM, MG, PHOS in the last 168 hours.   F/u labs not needed Unresulted Labs (From admission, onward)     Start     Ordered   10/15/20 0500  CBC  Tomorrow morning,   R       Question:  Specimen collection method  Answer:  Lab=Lab collect   10/14/20 0936            Signed, Terrilee Croak, MD [facility] 10/14/2020

## 2020-10-14 NOTE — Plan of Care (Signed)
Pt oriented only to self, selectively speaks and answers questions. PRN hydralazine given x1 d/t elevated BP. Adequate UO, no BM this shift. Fall/safety precautions in place, rounding performed.   Problem: Education: Goal: Knowledge of General Education information will improve Description: Including pain rating scale, medication(s)/side effects and non-pharmacologic comfort measures Outcome: Progressing   Problem: Health Behavior/Discharge Planning: Goal: Ability to manage health-related needs will improve Outcome: Progressing   Problem: Clinical Measurements: Goal: Ability to maintain clinical measurements within normal limits will improve Outcome: Progressing Goal: Will remain free from infection Outcome: Progressing Goal: Diagnostic test results will improve Outcome: Progressing   Problem: Activity: Goal: Risk for activity intolerance will decrease Outcome: Progressing   Problem: Nutrition: Goal: Adequate nutrition will be maintained Outcome: Progressing   Problem: Coping: Goal: Level of anxiety will decrease Outcome: Progressing   Problem: Pain Managment: Goal: General experience of comfort will improve Outcome: Progressing   Problem: Safety: Goal: Ability to remain free from injury will improve Outcome: Progressing

## 2020-10-14 NOTE — Plan of Care (Signed)
°  Problem: Education: °Goal: Knowledge of General Education information will improve °Description: Including pain rating scale, medication(s)/side effects and non-pharmacologic comfort measures °Outcome: Progressing °  °Problem: Health Behavior/Discharge Planning: °Goal: Ability to manage health-related needs will improve °Outcome: Progressing °  °Problem: Clinical Measurements: °Goal: Ability to maintain clinical measurements within normal limits will improve °Outcome: Progressing °Goal: Will remain free from infection °Outcome: Progressing °Goal: Diagnostic test results will improve °Outcome: Progressing °  °Problem: Activity: °Goal: Risk for activity intolerance will decrease °Outcome: Progressing °  °Problem: Nutrition: °Goal: Adequate nutrition will be maintained °Outcome: Progressing °  °Problem: Coping: °Goal: Level of anxiety will decrease °Outcome: Progressing °  °Problem: Pain Managment: °Goal: General experience of comfort will improve °Outcome: Progressing °  °Problem: Safety: °Goal: Ability to remain free from injury will improve °Outcome: Progressing °  °Problem: Skin Integrity: °Goal: Risk for impaired skin integrity will decrease °Outcome: Progressing °  °

## 2020-10-14 NOTE — Progress Notes (Signed)
PT Cancellation Note  Patient Details Name: Daisy Watkins MRN: [id number] DOB: March 16, 1933   Cancelled Treatment:    Reason Eval/Treat Not Completed: Patient declined, no reason specified  PT encouraged pt to participate in and educated pt on importance of PT. However, pt declines participation without providing reason. Nursing present and reports pt has been declining participating in PT/getting out of bed on and off. PT will re-attempt as able.   Ricard Dillon PT, DPT 10/14/2020, 1:12 PM

## 2020-10-15 ENCOUNTER — Other Ambulatory Visit: Payer: Self-pay | Admitting: Family Medicine

## 2020-10-15 DIAGNOSIS — G9341 Metabolic encephalopathy: Secondary | ICD-10-CM | POA: Diagnosis not present

## 2020-10-15 DIAGNOSIS — I10 Essential (primary) hypertension: Secondary | ICD-10-CM

## 2020-10-15 LAB — CBC
HCT: 40.3 % (ref 36.0–46.0)
Hemoglobin: 13.3 g/dL (ref 12.0–15.0)
MCH: 29.6 pg (ref 26.0–34.0)
MCHC: 33 g/dL (ref 30.0–36.0)
MCV: 89.6 fL (ref 80.0–100.0)
Platelets: 172 10*3/uL (ref 150–400)
RBC: 4.5 MIL/uL (ref 3.87–5.11)
RDW: 13.5 % (ref 11.5–15.5)
WBC: 4.5 10*3/uL (ref 4.0–10.5)
nRBC: 0 % (ref 0.0–0.2)

## 2020-10-15 LAB — GLUCOSE, CAPILLARY
Glucose-Capillary: 103 mg/dL — ABNORMAL HIGH (ref 70–99)
Glucose-Capillary: 136 mg/dL — ABNORMAL HIGH (ref 70–99)
Glucose-Capillary: 183 mg/dL — ABNORMAL HIGH (ref 70–99)
Glucose-Capillary: 150 mg/dL — ABNORMAL HIGH (ref 70–99)

## 2020-10-15 NOTE — Progress Notes (Signed)
PT Cancellation Note  Patient Details Name: Daisy Watkins MRN: [id number] DOB: 03-Jul-1932   Cancelled Treatment:    Reason Eval/Treat Not Completed: Patient declined, no reason specified Treatment attempted, pt chart reviewed. Pt received supine in bed, RN in room. Attempted to convince pt to participate; she refused OOB activity including using the restroom as well as therex remaining in the bed. Will re-attempt at later date.  [name] PT, DPT  [name] 10/15/2020, 3:36 PM

## 2020-10-15 NOTE — Progress Notes (Signed)
PROGRESS NOTE  Daisy Watkins  DOB: 18-Sep-1932  PCP: Valerie Roys, DO GLO:[id number]  DOA: 09/30/2020  LOS: 33 days  Hospital Day: 20   Chief Complaint  Patient presents with   Altered Mental Status    Brief narrative: Daisy Watkins is a 87 y.o. female with PMH significant for chronic dementia with behavioral disturbances, Meningioma (09/14/20 MRI), HTN, DM2, CAD, Afib/DVT (on Eliquis). Patient presented to the ED on 7/3 with acute confusion, agitation with intermittent crying, and difficulty taking care of herself and taking home medications.   Patient was admitted for placement.  Subjective: Patient was seen and examined this morning.   Alert, awake, seems tired.  Does not have much appetite.  Nursing staff helping to feed her. Patient seems to be gradually deconditioning while waiting for placement  Assessment/Plan: Chronic Dementia with Behavioral Disturbances -Patient has had no episodes of agitation reported.  She has had no SI/HI. -Currently patient is alert, awake but not oriented to time place or person.  Not restless or agitated. -Continue Lexapro at 20 mg daily, Depakote to 1000 mg daily. -Continue Ativan 0.5 mg 3 times daily as needed for agitation, hydroxyzine 25 mg TID PRN for anxiety.  This can be increased to 50 mg if needed. -7/17, CT head was done because RN noted new alteration in mental status.  No new finding on CT head.  Patient gradually seems to be deconditioning while waiting for placement.   Left Anterior Lobe Meningioma -On Depakote for seizure control.   -Patient was evaluated by Neurosurgery on 09/14/20 admission and was deemed to be a poor surgical candidate. -She can follow up with Rad/Onc outpatient to consider radiotherapy.   Paroxysmal Atrial Fibrillation -Heart rate controlled on Coreg 3.125 mg twice daily.  Patient was persistently bradycardic to 50s.  7/16, Coreg was stopped.  Continue to monitor. -Continue Eliquis 5 mg BID for  anticoagulation.   History of DVT, unprovoked -Continue Eliquis 5 mg BID for anticoagulation.   Essential Hypertension -Blood pressure controlled on Coreg and ramipril  Uncontrolled type 2 diabetes mellitus -A1c 10.2 on 09/14/2020 -Currently on Levemir 12 units twice daily along with sliding scale insulin and Accu-Cheks. -Blood sugar level overall stable. Recent Labs  Lab 10/14/20 0723 10/14/20 1222 10/14/20 1637 10/14/20 2138 10/15/20 0754  GLUCAP 96 207* 190* 140* 103*     Coronary Artery Disease -On Eliquis.  Not on statin because of inability to tolerate side effect   Constipation -Was constipated for 10 days.  Had a good bowel movement on 7/15.  Almost had a vasovagal syncope with that.  Currently on Amitiza 24 mcg BID.   Lactobacillus Urine Cuture -This is a normal flora of the female GU tract / contaminant.  Mobility: PT eval obtained Code Status:   Code Status: Full Code  Nutritional status: Body mass index is 26.72 kg/m. Nutrition Problem: Inadequate oral intake Etiology: poor appetite Signs/Symptoms: per patient/family report, meal completion < 50% Diet:  Diet Order             Diet Carb Modified Fluid consistency: Thin; Room service appropriate? Yes  Diet effective now                  DVT prophylaxis:   apixaban (ELIQUIS) tablet 5 mg   Antimicrobials: None Fluid: None Consultants: None Family Communication: None at bedside  Status is: Inpatient  Remains inpatient appropriate because: Patient needs placement  Dispo: The patient is from: Home  Anticipated d/c is to: Long-term placement              Patient currently is medically stable to d/c.   Difficult to place patient No     Infusions:    Scheduled Meds:  apixaban  5 mg Oral BID   divalproex  1,000 mg Oral Daily   escitalopram  20 mg Oral Daily   feeding supplement (GLUCERNA SHAKE)  237 mL Oral TID BM   insulin aspart  0-15 Units Subcutaneous TID WC   insulin  aspart  0-5 Units Subcutaneous QHS   insulin detemir  12 Units Subcutaneous BID   multivitamin with minerals  1 tablet Oral Daily   polyethylene glycol  17 g Oral Daily   ramipril  5 mg Oral Daily   senna-docusate  1 tablet Oral QHS   thiamine  100 mg Oral Daily   vitamin B-12  1,000 mcg Oral Daily    Antimicrobials: Anti-infectives (From admission, onward)    None       PRN meds: acetaminophen, acetaminophen, hydrALAZINE, mineral oil, ondansetron (ZOFRAN) IV   Objective: Vitals:   10/15/20 0411 10/15/20 0756  BP: (!) 130/49 (!) 147/64  Pulse: (!) 54 (!) 52  Resp: 16 18  Temp: 98.2 F (36.8 C) 97.9 F (36.6 C)  SpO2: 98% 98%    Intake/Output Summary (Last 24 hours) at 10/15/2020 1110 Last data filed at 10/14/2020 2017 Gross per 24 hour  Intake --  Output 400 ml  Net -400 ml    Filed Weights   09/30/20 1031 10/11/20 0601  Weight: 74.5 kg 75.1 kg   Weight change:  Body mass index is 26.72 kg/m.   Physical Exam: General exam: Pleasant, elderly African-American female.  Not in distress.  Skin: No rashes, lesions or ulcers. HEENT: Atraumatic, normocephalic, no obvious bleeding Lungs: Clear to auscultation bilaterally CVS: Regular rate and rhythm, no murmur GI/Abd soft, nontender, nondistended, bowel sound present CNS: Alert, awake, disoriented.  Not restless or agitated.  Able to follow commands. Psychiatry: Cheerful Extremities: No pedal edema, no calf tenderness  Data Review: I have personally reviewed the laboratory data and studies available.  Recent Labs  Lab 10/11/20 0454 10/15/20 0536  WBC 5.4 4.5  HGB 12.4 13.3  HCT 37.7 40.3  MCV 90.0 89.6  PLT 169 172    No results for input(s): NA, K, CL, CO2, GLUCOSE, BUN, CREATININE, CALCIUM, MG, PHOS in the last 168 hours.   F/u labs not needed Unresulted Labs (From admission, onward)    None       Signed, Terrilee Croak, MD [facility] 10/15/2020

## 2020-10-15 NOTE — Telephone Encounter (Signed)
Pt has apt on 10/17/2020

## 2020-10-15 NOTE — Progress Notes (Signed)
Writer assisted patient with breakfast; encouraged patient to feed self and assisted when necessary. Patient fatigues easily and states she just wants to sleep.

## 2020-10-15 NOTE — Telephone Encounter (Signed)
   Notes to clinic:  The original prescription was discontinued on 09/20/2020 by Park Liter P, DO. Renewing this prescription may not be appropriate   Requested Prescriptions  Pending Prescriptions Disp Refills   amLODipine (NORVASC) 10 MG tablet [Pharmacy Med Name: AMLODIPINE BESYLATE 10 MG TAB] 30 tablet 1    Sig: TAKE 1 TABLET BY MOUTH EVERY DAY      Cardiovascular:  Calcium Channel Blockers Failed - 10/15/2020 11:32 AM      Failed - Last BP in normal range    BP Readings from Last 1 Encounters:  10/15/20 (!) 147/64          Passed - Valid encounter within last 6 months    Recent Outpatient Visits           3 weeks ago Paroxysmal A-fib [facility])   [place], Megan P, DO   6 months ago Type 2 diabetes mellitus with hyperglycemia, with long-term current use of insulin ([place])   [facility], Megan P, DO   9 months ago Type 2 diabetes mellitus with hyperglycemia, with long-term current use of insulin ([place])   [place], Megan P, DO   10 months ago Type 2 diabetes mellitus with hyperglycemia, with long-term current use of insulin ([place])   [place], [place], DO   1 year ago Primary insomnia   [facility] [place], Ben Arnold, DO       Future Appointments             In 2 days Wynetta Emery, Barb Merino, DO [place], [place]   In 3 weeks  [employer], [facility]

## 2020-10-16 LAB — URINALYSIS, ROUTINE W REFLEX MICROSCOPIC
Bilirubin Urine: NEGATIVE
Glucose, UA: NEGATIVE mg/dL
Hgb urine dipstick: NEGATIVE
Ketones, ur: 5 mg/dL — AB
Leukocytes,Ua: NEGATIVE
Nitrite: NEGATIVE
Protein, ur: NEGATIVE mg/dL
Specific Gravity, Urine: 1.02 (ref 1.005–1.030)
pH: 6 (ref 5.0–8.0)

## 2020-10-16 LAB — GLUCOSE, CAPILLARY
Glucose-Capillary: 68 mg/dL — ABNORMAL LOW (ref 70–99)
Glucose-Capillary: 155 mg/dL — ABNORMAL HIGH (ref 70–99)
Glucose-Capillary: 177 mg/dL — ABNORMAL HIGH (ref 70–99)
Glucose-Capillary: 201 mg/dL — ABNORMAL HIGH (ref 70–99)

## 2020-10-16 NOTE — Progress Notes (Signed)
PT Cancellation Note  Patient Details Name: Daisy Watkins MRN: [id number] DOB: 1932-04-11   Cancelled Treatment:    Reason Eval/Treat Not Completed: Patient declined, no reason specified Treatment attempted, pt chart reviewed. Pt received supine in bed. Attempted to convince pt to participate with either OOB activity or in bed activity; she refused. Will re-attempt at later date.  [name] PT, DPT 10/16/20 11:25 AM [phone]  Daisy Watkins 10/16/2020, 11:23 AM

## 2020-10-16 NOTE — Progress Notes (Addendum)
No urine output charted two days. Bladder scan showed 620ml. MD notified pending orders  I&O complete per order with UA sent to lab at 1430

## 2020-10-16 NOTE — Progress Notes (Signed)
PROGRESS NOTE  KINZLEIGH KANDLER  DOB: March 03, 1933  PCP: Valerie Roys, DO PIR:[id number]  DOA: 09/30/2020  LOS: 15 days  Hospital Day: 65   Chief Complaint  Patient presents with   Altered Mental Status    Brief narrative: Daisy Watkins is a 87 y.o. female with PMH significant for chronic dementia with behavioral disturbances, Meningioma (09/14/20 MRI), HTN, DM2, CAD, Afib/DVT (on Eliquis). Patient presented to the ED on 7/3 with acute confusion, agitation with intermittent crying, and difficulty taking care of herself and taking home medications.   Patient was admitted basically for placement.  Subjective: Patient was seen and examined this morning.   Alert, awake, seems tired.  Does not have much appetite.  Nursing staff helping to feed her. Patient seems to be gradually deconditioning while waiting for placement. No change in last 24 hours.  Assessment/Plan: Chronic Dementia with Behavioral Disturbances -Patient has had no episodes of agitation reported.  She has had no SI/HI. -Currently patient is alert, awake but not oriented to time place or person.  Not restless or agitated. -Continue Lexapro at 20 mg daily, Depakote to 1000 mg daily. -Continue Ativan 0.5 mg 3 times daily as needed for agitation, hydroxyzine 25 mg TID PRN for anxiety.  This can be increased to 50 mg if needed. -7/17, CT head was done because RN noted new alteration in mental status.  No new finding on CT head.  Patient gradually seems to be deconditioning while waiting for placement.   Left Anterior Lobe Meningioma -On Depakote for seizure control.   -Patient was evaluated by Neurosurgery on 09/14/20 admission and was deemed to be a poor surgical candidate. -She can follow up with Rad/Onc outpatient to consider radiotherapy.   Paroxysmal Atrial Fibrillation -Heart rate controlled on Coreg 3.125 mg twice daily.  Patient was persistently bradycardic to 50s.  7/16, Coreg was stopped.  Continue to  monitor. -Continue Eliquis 5 mg BID for anticoagulation.   History of DVT, unprovoked -Continue Eliquis 5 mg BID for anticoagulation.   Essential Hypertension -Blood pressure controlled on Coreg and ramipril  Uncontrolled type 2 diabetes mellitus -A1c 10.2 on 09/14/2020 -Currently on Levemir 12 units twice daily along with sliding scale insulin and Accu-Cheks. -Blood sugar level overall stable. Recent Labs  Lab 10/15/20 0754 10/15/20 1133 10/15/20 1628 10/15/20 2217 10/16/20 0800  GLUCAP 103* 136* 183* 150* 68*     Coronary Artery Disease -On Eliquis.  Not on statin because of inability to tolerate side effect   Constipation -Was constipated for 10 days.  Had a good bowel movement on 7/15.  Almost had a vasovagal syncope with that.  Currently on Amitiza 24 mcg BID.   Lactobacillus Urine Cuture -This is a normal flora of the female GU tract / contaminant.  Mobility: PT eval obtained Code Status:   Code Status: Full Code  Nutritional status: Body mass index is 26.72 kg/m. Nutrition Problem: Inadequate oral intake Etiology: poor appetite Signs/Symptoms: per patient/family report, meal completion < 50% Diet:  Diet Order             Diet Carb Modified Fluid consistency: Thin; Room service appropriate? Yes  Diet effective now                  DVT prophylaxis:   apixaban (ELIQUIS) tablet 5 mg   Antimicrobials: None Fluid: None Consultants: None Family Communication: None at bedside  Status is: Inpatient  Remains inpatient appropriate because: Patient needs placement  Dispo: The patient is  from: Home              Anticipated d/c is to: Long-term placement              Patient currently is medically stable to d/c.   Difficult to place patient No     Infusions:    Scheduled Meds:  apixaban  5 mg Oral BID   divalproex  1,000 mg Oral Daily   escitalopram  20 mg Oral Daily   feeding supplement (GLUCERNA SHAKE)  237 mL Oral TID BM   insulin aspart   0-15 Units Subcutaneous TID WC   insulin aspart  0-5 Units Subcutaneous QHS   insulin detemir  12 Units Subcutaneous BID   multivitamin with minerals  1 tablet Oral Daily   polyethylene glycol  17 g Oral Daily   ramipril  5 mg Oral Daily   senna-docusate  1 tablet Oral QHS   thiamine  100 mg Oral Daily   vitamin B-12  1,000 mcg Oral Daily    Antimicrobials: Anti-infectives (From admission, onward)    None       PRN meds: acetaminophen, acetaminophen, hydrALAZINE, mineral oil, ondansetron (ZOFRAN) IV   Objective: Vitals:   10/15/20 2049 10/16/20 0423  BP: (!) 157/57 139/68  Pulse: (!) 51 (!) 54  Resp: 18 18  Temp: 98.1 F (36.7 C) 97.7 F (36.5 C)  SpO2: 97% 97%   No intake or output data in the 24 hours ending 10/16/20 1149  Filed Weights   09/30/20 1031 10/11/20 0601  Weight: 74.5 kg 75.1 kg   Weight change:  Body mass index is 26.72 kg/m.   Physical Exam: General exam: Pleasant, elderly African-American female.  Not in distress.  Skin: No rashes, lesions or ulcers. HEENT: Atraumatic, normocephalic, no obvious bleeding Lungs: Clear to auscultation bilaterally CVS: Regular rate and rhythm, no murmur GI/Abd soft, nontender, nondistended, bowel sound present CNS: Alert, awake, disoriented.  Not restless or agitated.  Able to follow commands. Psychiatry: Cheerful Extremities: No pedal edema, no calf tenderness  Data Review: I have personally reviewed the laboratory data and studies available.  Recent Labs  Lab 10/11/20 0454 10/15/20 0536  WBC 5.4 4.5  HGB 12.4 13.3  HCT 37.7 40.3  MCV 90.0 89.6  PLT 169 172    No results for input(s): NA, K, CL, CO2, GLUCOSE, BUN, CREATININE, CALCIUM, MG, PHOS in the last 168 hours.   F/u labs not needed Unresulted Labs (From admission, onward)    None       Signed, Terrilee Croak, MD [facility] 10/16/2020

## 2020-10-17 ENCOUNTER — Inpatient Hospital Stay: Payer: Medicare HMO | Admitting: Family Medicine

## 2020-10-17 DIAGNOSIS — G9341 Metabolic encephalopathy: Secondary | ICD-10-CM | POA: Diagnosis not present

## 2020-10-17 LAB — GLUCOSE, CAPILLARY
Glucose-Capillary: 80 mg/dL (ref 70–99)
Glucose-Capillary: 223 mg/dL — ABNORMAL HIGH (ref 70–99)
Glucose-Capillary: 202 mg/dL — ABNORMAL HIGH (ref 70–99)
Glucose-Capillary: 215 mg/dL — ABNORMAL HIGH (ref 70–99)
Glucose-Capillary: 154 mg/dL — ABNORMAL HIGH (ref 70–99)

## 2020-10-17 MED ORDER — LORAZEPAM 0.5 MG PO TABS
0.5000 mg | ORAL_TABLET | Freq: Two times a day (BID) | ORAL | Status: DC | PRN
  Administered 2020-10-17 – 2020-11-02 (×18): 0.5 mg via ORAL
  Filled 2020-10-17 (×19): qty 1

## 2020-10-17 NOTE — Progress Notes (Signed)
PROGRESS NOTE  Daisy Watkins  DOB: 04/02/1932  PCP: Valerie Roys, DO PQZ:[id number]  DOA: 09/30/2020  LOS: 56 days  Hospital Day: 13   Chief Complaint  Patient presents with   Altered Mental Status    Brief narrative: Daisy Watkins is a 87 y.o. female with PMH significant for chronic dementia with behavioral disturbances, Meningioma (09/14/20 MRI), HTN, DM2, CAD, Afib/DVT (on Eliquis). Patient presented to the ED on 7/3 with acute confusion, agitation with intermittent crying, and difficulty taking care of herself and taking home medications.   -remains medically stable awaiting patient placement  Subjective: -pt seen and examined, denies any complaints, no events overnight per staff  Assessment/Plan:  Chronic Dementia with Behavioral Disturbances -Currently patient is alert, awake but not oriented to time place or person.  Not restless or agitated. -Continue Lexapro at 20 mg daily, Depakote to 1000 mg daily ( for seizures) -has been stable on Ativan 0.5 mg 3 times daily PRN and hydroxyzine 25 mg TID PRN for anxiety -labs, CT head unremarkable   Left Anterior Lobe Meningioma -On Depakote for seizure control.   -Patient was evaluated by Neurosurgery on 09/14/20 admission and was deemed to be a poor surgical candidate.   Paroxysmal Atrial Fibrillation -Heart rate controlled on Coreg 3.125 mg twice daily.  Patient was persistently bradycardic to 50s.  7/16, Coreg was stopped.  Continue to monitor. -Continue Eliquis 5 mg BID for anticoagulation.   History of DVT, unprovoked -Continue Eliquis 5 mg BID for anticoagulation.   Essential Hypertension -Blood pressure controlled on Coreg and ramipril  Uncontrolled type 2 diabetes mellitus -A1c 10.2 on 09/14/2020 -Currently on Levemir 12 units twice daily along with sliding scale insulin and Accu-Cheks. -Blood sugar level overall stable.  Coronary Artery Disease -On Eliquis.  Not on statin because of inability to tolerate side  effect   Constipation -Was constipated for 10 days.  Had a good bowel movement on 7/15.  Almost had a vasovagal syncope with that.  Currently on Amitiza 24 mcg BID.   Lactobacillus Urine Cuture -This is a normal flora of the female GU tract / contaminant.  Mobility: PT eval obtained Code Status:   Code Status: Full Code  Nutritional status: Body mass index is 26.72 kg/m. Nutrition Problem: Inadequate oral intake Etiology: poor appetite Signs/Symptoms: per patient/family report, meal completion < 50% Diet:  Diet Order             Diet Carb Modified Fluid consistency: Thin; Room service appropriate? Yes  Diet effective now                  DVT prophylaxis:   apixaban (ELIQUIS) tablet 5 mg   Antimicrobials: None Fluid: None Consultants: None Family Communication: None at bedside  Status is: Inpatient  Remains inpatient appropriate because: Patient needs placement  Dispo: The patient is from: Home              Anticipated d/c is to: Long-term placement              Patient currently is medically stable to d/c.   Difficult to place patient No     Infusions:    Scheduled Meds:  apixaban  5 mg Oral BID   divalproex  1,000 mg Oral Daily   escitalopram  20 mg Oral Daily   feeding supplement (GLUCERNA SHAKE)  237 mL Oral TID BM   insulin aspart  0-15 Units Subcutaneous TID WC   insulin aspart  0-5 Units  Subcutaneous QHS   insulin detemir  12 Units Subcutaneous BID   multivitamin with minerals  1 tablet Oral Daily   polyethylene glycol  17 g Oral Daily   ramipril  5 mg Oral Daily   senna-docusate  1 tablet Oral QHS   thiamine  100 mg Oral Daily   vitamin B-12  1,000 mcg Oral Daily    Antimicrobials: Anti-infectives (From admission, onward)    None       PRN meds: acetaminophen, acetaminophen, hydrALAZINE, mineral oil, ondansetron (ZOFRAN) IV   Objective: Vitals:   10/17/20 0741 10/17/20 1137  BP: (!) 132/51 (!) 142/76  Pulse: 69 78  Resp: 18 14   Temp: 97.9 F (36.6 C) 98.4 F (36.9 C)  SpO2: 90% 100%    Intake/Output Summary (Last 24 hours) at 10/17/2020 1412 Last data filed at 10/17/2020 0650 Gross per 24 hour  Intake --  Output 630 ml  Net -630 ml    Filed Weights   09/30/20 1031 10/11/20 0601  Weight: 74.5 kg 75.1 kg   Weight change:  Body mass index is 26.72 kg/m.   Physical Exam: Gen: Pleasant elderly chronically ill female sitting up in bed, hard of hearing, awake alert oriented to self and place HEENT: No JVD CVS: S1-S2, regular rate rhythm Lungs: Clear bilaterally Abdomen: Soft, nontender, bowel sounds present Extremities: No edema Psych: Poor insight and judgment Skin: no obvious rashes on exposed skin  Data Review: I have personally reviewed the laboratory data and studies available.  Recent Labs  Lab 10/11/20 0454 10/15/20 0536  WBC 5.4 4.5  HGB 12.4 13.3  HCT 37.7 40.3  MCV 90.0 89.6  PLT 169 172    No results for input(s): NA, K, CL, CO2, GLUCOSE, BUN, CREATININE, CALCIUM, MG, PHOS in the last 168 hours.   F/u labs not needed Unresulted Labs (From admission, onward)     Start     Ordered   10/16/20 1426  Urine Culture  Once,   R       Question:  Indication  Answer:  Dysuria   10/16/20 1426            Signed, Domenic Polite, MD [facility] 10/17/2020

## 2020-10-17 NOTE — Progress Notes (Signed)
Physical Therapy Treatment Patient Details Name: Daisy Watkins MRN: [id number] DOB: [birth year] Today's Date: 10/17/2020    History of Present Illness Pt is an 87 y/o admitted on 09/30/20 with c/c of AMS with confusion and agitation. Pt recently d/c on 09/15/20 after being admitted for change in mental status. PMH: anxiety, DVT, DM, GERD, insomnia, HTN, HLD, insomnia, myalgia.    PT Comments    Upon arrival, pt is pleasant and agreeable to therapy. This was today's second treatment attempt; RN messaged PT this afternoon stating pt was currently agreeable to therapy. She was listening to music; when PT asked if she wanted to stand up and dance she replied "yes." Pt sat up to EOB with SUP for safety; reported dizziness upon upright. Pt remained sitting EOB for 5 minutes with no reported improvement or worsening of symptoms. During this time, pt performed seated marching to increase blood flow; she refused all other therex. Pt continued to close her eyes while sitting, she opened her eyes briefly when asked. When eyes were closed, her trunk drifted backwards requiring CGA to correct. Pt then stated she wanted to return to bed and did so abruptly. PT communicated with RN after session regarding mobility and level of participation. Pt would benefit from skilled PT to address above deficits and promote optimal return to PLOF.   Follow Up Recommendations  Supervision/Assistance - 24 hour;Supervision for mobility/OOB     Equipment Recommendations  None recommended by PT    Recommendations for Other Services       Precautions / Restrictions Precautions Precautions: Fall Restrictions Weight Bearing Restrictions: No    Mobility  Bed Mobility Overal bed mobility: Needs Assistance Bed Mobility: Supine to Sit;Sit to Supine     Supine to sit: Supervision Sit to supine: Mod assist   General bed mobility comments: HOB elevated and bedrails used for supine>sit. Today pt required MOD A to lift BLE into  bed. Pt has previously returned to bed with SUP.    Transfers                 General transfer comment: Pt refused however performed bed<>chair transfer with NA this morning (per RN), assist level unknown.  Ambulation/Gait                 Stairs             Wheelchair Mobility    Modified Rankin (Stroke Patients Only)       Balance Overall balance assessment: Needs assistance Sitting-balance support: Feet supported Sitting balance-Leahy Scale: Fair Sitting balance - Comments: Pt ranged from independent-CGA to steady while sitting EOB; CGA required as pt reported feeling dizzy and demo an occasional posterior drift of trunk                                    Cognition Arousal/Alertness: Awake/alert Behavior During Therapy: Flat affect Overall Cognitive Status: No family/caregiver present to determine baseline cognitive functioning Area of Impairment: Attention;Following commands;Safety/judgement;Awareness;Orientation                 Orientation Level: Disoriented to;Time     Following Commands: Follows one step commands inconsistently Safety/Judgement: Decreased awareness of safety;Decreased awareness of deficits     General Comments: Pt cognition greatly impacts session. Pt was cooperative at first; once sitting EOB she became very uncooperative and responded "no" to all questions/thoughts before closing eyes and not responding at  all.      Exercises General Exercises - Lower Extremity Hip Flexion/Marching: AROM;Seated;20 reps Other Exercises Other Exercises: Pt sat EOB for 5 minutes. She reported dizziness that did not progress or improve with time. Seated marches performed at EOB. See balance for further details.    General Comments        Pertinent Vitals/Pain Pain Assessment: Faces Faces Pain Scale: No hurt    Home Living                      Prior Function            PT Goals (current goals can now  be found in the care plan section) Acute Rehab PT Goals Patient Stated Goal: none stated PT Goal Formulation: With patient    Frequency    Min 2X/week      PT Plan      Co-evaluation              AM-PAC PT "6 Clicks" Mobility   Outcome Measure  Help needed turning from your back to your side while in a flat bed without using bedrails?: None Help needed moving from lying on your back to sitting on the side of a flat bed without using bedrails?: A Little Help needed moving to and from a bed to a chair (including a wheelchair)?: A Little Help needed standing up from a chair using your arms (e.g., wheelchair or bedside chair)?: A Little Help needed to walk in hospital room?: A Little Help needed climbing 3-5 steps with a railing? : A Little 6 Click Score: 19    End of Session   Activity Tolerance: Other (comment) (limited by cognitive status) Patient left: in bed;with bed alarm set;with call bell/phone within reach Nurse Communication: Mobility status PT Visit Diagnosis: Unsteadiness on feet (R26.81)     Time: 5859-2924 PT Time Calculation (min) (ACUTE ONLY): 11 min  Charges:  $Therapeutic Activity: 8-22 mins                    [name] PT, DPT 10/17/20 3:13 PM [phone]   [name] 10/17/2020, 2:59 PM

## 2020-10-17 NOTE — Progress Notes (Deleted)
PROGRESS NOTE  Daisy Watkins  DOB: 1932/12/12  PCP: Valerie Roys, DO WCB:[id number]  DOA: 09/30/2020  LOS: 69 days  Hospital Day: 34   Chief Complaint  Patient presents with   Altered Mental Status    Brief narrative: Daisy Watkins is a 87 y.o. female with PMH significant for chronic dementia with behavioral disturbances, Meningioma (09/14/20 MRI), HTN, DM2, CAD, Afib/DVT (on Eliquis). Patient presented to the ED on 7/3 with acute confusion, agitation with intermittent crying, and difficulty taking care of herself and taking home medications.   Patient was admitted basically for placement.  Subjective: -pt seen and examined, denies any complaints, no events overnight per staff  Assessment/Plan:  Chronic Dementia with Behavioral Disturbances -Currently patient is alert, awake but not oriented to time place or person.  Not restless or agitated. -Continue Lexapro at 20 mg daily, Depakote to 1000 mg daily ( for seizures) -has been stable on Ativan 0.5 mg 3 times daily PRN and hydroxyzine 25 mg TID PRN for anxiety -labs, CT head unremarkable   Left Anterior Lobe Meningioma -On Depakote for seizure control.   -Patient was evaluated by Neurosurgery on 09/14/20 admission and was deemed to be a poor surgical candidate.   Paroxysmal Atrial Fibrillation -Heart rate controlled on Coreg 3.125 mg twice daily.  Patient was persistently bradycardic to 50s.  7/16, Coreg was stopped.  Continue to monitor. -Continue Eliquis 5 mg BID for anticoagulation.   History of DVT, unprovoked -Continue Eliquis 5 mg BID for anticoagulation.   Essential Hypertension -Blood pressure controlled on Coreg and ramipril  Uncontrolled type 2 diabetes mellitus -A1c 10.2 on 09/14/2020 -Currently on Levemir 12 units twice daily along with sliding scale insulin and Accu-Cheks. -Blood sugar level overall stable.  Coronary Artery Disease -On Eliquis.  Not on statin because of inability to tolerate side effect    Constipation -Was constipated for 10 days.  Had a good bowel movement on 7/15.  Almost had a vasovagal syncope with that.  Currently on Amitiza 24 mcg BID.   Lactobacillus Urine Cuture -This is a normal flora of the female GU tract / contaminant.  Mobility: PT eval obtained Code Status:   Code Status: Full Code  Nutritional status: Body mass index is 26.72 kg/m. Nutrition Problem: Inadequate oral intake Etiology: poor appetite Signs/Symptoms: per patient/family report, meal completion < 50% Diet:  Diet Order             Diet Carb Modified Fluid consistency: Thin; Room service appropriate? Yes  Diet effective now                  DVT prophylaxis:   apixaban (ELIQUIS) tablet 5 mg   Antimicrobials: None Fluid: None Consultants: None Family Communication: None at bedside  Status is: Inpatient  Remains inpatient appropriate because: Patient needs placement  Dispo: The patient is from: Home              Anticipated d/c is to: Long-term placement              Patient currently is medically stable to d/c.   Difficult to place patient No     Infusions:    Scheduled Meds:  apixaban  5 mg Oral BID   divalproex  1,000 mg Oral Daily   escitalopram  20 mg Oral Daily   feeding supplement (GLUCERNA SHAKE)  237 mL Oral TID BM   insulin aspart  0-15 Units Subcutaneous TID WC   insulin aspart  0-5 Units  Subcutaneous QHS   insulin detemir  12 Units Subcutaneous BID   multivitamin with minerals  1 tablet Oral Daily   polyethylene glycol  17 g Oral Daily   ramipril  5 mg Oral Daily   senna-docusate  1 tablet Oral QHS   thiamine  100 mg Oral Daily   vitamin B-12  1,000 mcg Oral Daily    Antimicrobials: Anti-infectives (From admission, onward)    None       PRN meds: acetaminophen, acetaminophen, hydrALAZINE, mineral oil, ondansetron (ZOFRAN) IV   Objective: Vitals:   10/17/20 0741 10/17/20 1137  BP: (!) 132/51 (!) 142/76  Pulse: 69 78  Resp: 18 14  Temp:  97.9 F (36.6 C) 98.4 F (36.9 C)  SpO2: 90% 100%    Intake/Output Summary (Last 24 hours) at 10/17/2020 1400 Last data filed at 10/17/2020 0650 Gross per 24 hour  Intake --  Output 1630 ml  Net -1630 ml   Filed Weights   09/30/20 1031 10/11/20 0601  Weight: 74.5 kg 75.1 kg   Weight change:  Body mass index is 26.72 kg/m.   Physical Exam: Gen: Pleasant elderly chronically ill female sitting up in bed, hard of hearing, awake alert oriented to self and place HEENT: No JVD CVS: S1-S2, regular rate rhythm Lungs: Clear bilaterally Abdomen: Soft, nontender, bowel sounds present Extremities: No edema Psych: Poor insight and judgment Skin: no obvious rashes on exposed skin  Data Review: I have personally reviewed the laboratory data and studies available.  Recent Labs  Lab 10/11/20 0454 10/15/20 0536  WBC 5.4 4.5  HGB 12.4 13.3  HCT 37.7 40.3  MCV 90.0 89.6  PLT 169 172   No results for input(s): NA, K, CL, CO2, GLUCOSE, BUN, CREATININE, CALCIUM, MG, PHOS in the last 168 hours.   F/u labs not needed Unresulted Labs (From admission, onward)     Start     Ordered   10/16/20 1426  Urine Culture  Once,   R       Question:  Indication  Answer:  Dysuria   10/16/20 1426            Signed, Domenic Polite, MD [facility] 10/17/2020

## 2020-10-17 NOTE — Progress Notes (Signed)
PT Cancellation Note  Patient Details Name: Daisy Watkins MRN: [id number] DOB: [birth year]   Cancelled Treatment:    Reason Eval/Treat Not Completed: Patient declined, no reason specified. Treatment attempted, chart reviewed. Pt pleasant upon PT arrival however declines all participation including offers to use the restroom, stand up, sit EOB and ambulate. Will re-attempt at later date.  [name] PT, DPT 10/17/20 11:11 AM [phone]  Daisy Watkins 10/17/2020, 11:07 AM

## 2020-10-17 NOTE — Progress Notes (Signed)
Nutrition Follow-up  DOCUMENTATION CODES:  Not applicable  INTERVENTION:  Continue current diet as ordered  Glucerna Shake po TID, each supplement provides 220 kcal and 10 grams of protein Multivitamin with minerals daily Nursing to assist pt with meals New measured weight to assess for weight loss.  NUTRITION DIAGNOSIS:  Inadequate oral intake related to poor appetite as evidenced by per patient/family report, meal completion < 50%.  GOAL:  Patient will meet greater than or equal to 90% of their needs  MONITOR:  PO intake, Supplement acceptance, Weight trends  REASON FOR ASSESSMENT:  Malnutrition Screening Tool    ASSESSMENT:  87 y.o. female brought to ED from home with AMS. Family reports that pt was agitated earlier in the day and then became unresponsive. PMH relevant for DM, GERD, HTN, HLD, Vitamin B12 and D deficiencies, and atrial fibrillation.  Pt recently admitted for AMS and imaging obtained during that admission showed L anterior temporal meningioma with associated vasogenic edema. No significant change in size from last admission when re-imaged. Neurology and Psychiatry consulting.   Pt remains medically stable awaiting discharge. Difficult placement.  Mean intake remains poor, but slightly improved since last assessment. Pt was placed on aggressive bowel regimen after last assessment and had large BM 7/15, none recorded since but pt is on scheduled bowel regimen.  Routinely accepting glucerna supplements. Will continue current nutrition interventions and request new measured weight.  Average Meal Intake: 7/3-7/5: 0% intake x 1 recorded meal 7/6-7/14: 15% intake x 23 recorded meals (0-100%) 7/15-7/20: 33% intake x 3 recorded meals (25-50%)  Nutritionally Relevant Medications: Scheduled Meds:  feeding supplement (GLUCERNA SHAKE)  237 mL Oral TID BM   insulin aspart  0-15 Units Subcutaneous TID WC   insulin aspart  0-5 Units Subcutaneous QHS   insulin detemir   12 Units Subcutaneous BID   multivitamin with minerals  1 tablet Oral Daily   polyethylene glycol  17 g Oral Daily   senna-docusate  1 tablet Oral QHS   thiamine  100 mg Oral Daily   vitamin B-12  1,000 mcg Oral Daily   PRN Meds: mineral oil, ondansetron   Labs Reviewed: SBG ranges from 68-223 mg/dL over the last 24 hours HgbA1c 10.2% (6/17)  NUTRITION - FOCUSED PHYSICAL EXAM: Flowsheet Row Most Recent Value  Orbital Region No depletion  Upper Arm Region No depletion  Thoracic and Lumbar Region No depletion  Buccal Region No depletion  Temple Region No depletion  Clavicle Bone Region No depletion  Clavicle and Acromion Bone Region No depletion  Scapular Bone Region No depletion  Dorsal Hand No depletion  Patellar Region No depletion  Anterior Thigh Region No depletion  Posterior Calf Region No depletion  Edema (RD Assessment) Mild  Hair Reviewed  Eyes Reviewed  Mouth Reviewed  Skin Reviewed  Nails Reviewed   Diet Order:   Diet Order             Diet Carb Modified Fluid consistency: Thin; Room service appropriate? Yes  Diet effective now                   EDUCATION NEEDS:  No education needs have been identified at this time  Skin:  Skin Assessment: Reviewed RN Assessment  Last BM:  7/15 - bowel regimen in place   Height:  Ht Readings from Last 1 Encounters:  09/30/20 5\' 6"  (1.676 m)    Weight:  Wt Readings from Last 1 Encounters:  10/11/20 75.1 kg  Ideal Body Weight:  59.1 kg  BMI:  Body mass index is 26.72 kg/m.  Estimated Nutritional Needs:  Kcal:  1600-1800 kcal/d Protein:  80-90 g/d Fluid:  1.8-2L/d   Daisy Watkins, RD, LDN Clinical Dietitian Pager on [place]

## 2020-10-18 LAB — BASIC METABOLIC PANEL
Anion gap: 8 (ref 5–15)
BUN: 17 mg/dL (ref 8–23)
CO2: 28 mmol/L (ref 22–32)
Calcium: 9.3 mg/dL (ref 8.9–10.3)
Chloride: 103 mmol/L (ref 98–111)
Creatinine, Ser: 0.59 mg/dL (ref 0.44–1.00)
GFR, Estimated: >60 mL/min (ref >60)
Glucose, Bld: 193 mg/dL — ABNORMAL HIGH (ref 70–99)
Potassium: 3.9 mmol/L (ref 3.5–5.1)
Sodium: 139 mmol/L (ref 135–145)

## 2020-10-18 LAB — URINE CULTURE: Culture: >=100000 — AB

## 2020-10-18 LAB — GLUCOSE, CAPILLARY
Glucose-Capillary: 123 mg/dL — ABNORMAL HIGH (ref 70–99)
Glucose-Capillary: 100 mg/dL — ABNORMAL HIGH (ref 70–99)
Glucose-Capillary: 200 mg/dL — ABNORMAL HIGH (ref 70–99)
Glucose-Capillary: 170 mg/dL — ABNORMAL HIGH (ref 70–99)

## 2020-10-18 LAB — CBC
HCT: 37 % (ref 36.0–46.0)
Hemoglobin: 12 g/dL (ref 12.0–15.0)
MCH: 29.1 pg (ref 26.0–34.0)
MCHC: 32.4 g/dL (ref 30.0–36.0)
MCV: 89.6 fL (ref 80.0–100.0)
Platelets: 156 10*3/uL (ref 150–400)
RBC: 4.13 MIL/uL (ref 3.87–5.11)
RDW: 13.5 % (ref 11.5–15.5)
WBC: 4.6 10*3/uL (ref 4.0–10.5)
nRBC: 0 % (ref 0.0–0.2)

## 2020-10-18 NOTE — Progress Notes (Signed)
Physical Therapy Treatment Patient Details Name: Daisy Watkins MRN: [id number] DOB: Feb 01, 1933 Today's Date: 10/18/2020    History of Present Illness Pt is an 87 y/o admitted on 09/30/20 with c/c of AMS with confusion and agitation. Pt recently d/c on 09/15/20 after being admitted for change in mental status. PMH: anxiety, DVT, DM, GERD, insomnia, HTN, HLD, insomnia, myalgia.    PT Comments    Pt was asleep upon arriving. She easily awakes and is alert throughout remainder of session. Pt is disoriented to situation. Was able to follow simple commands well. Pt was easily able to exit L side of bed, stand, and ambulate to doorway of room and back to recliner. Did not use RW. She tolerated ambulation well. At conclusion of session pt was repositioned in recliner with chair alarm in place, fall mat in appropriate position and tray table in front of pt. Acute PT recommend 24 hour supervision at DC due to cognition and poor safety awareness.     Follow Up Recommendations  Supervision/Assistance - 24 hour;Supervision for mobility/OOB     Equipment Recommendations  None recommended by PT    Recommendations for Other Services       Precautions / Restrictions Precautions Precautions: Fall Restrictions Weight Bearing Restrictions: No    Mobility  Bed Mobility Overal bed mobility: Needs Assistance Bed Mobility: Supine to Sit     Supine to sit: Supervision     General bed mobility comments: Pt was easily able to exit L side of bed without physical assistance. She required increased time + HOB was elevated.    Transfers Overall transfer level: Needs assistance   Transfers: Sit to/from Stand Sit to Stand: Supervision         General transfer comment: Pt performed STS EOB 2 x without lifting assistance. UNwillin got use RW however did not require for safety  Ambulation/Gait Ambulation/Gait assistance: Supervision;Min guard Gait Distance (Feet): 25 Feet Assistive device: None Gait  Pattern/deviations: WFL(Within Functional Limits) Gait velocity: decreased   General Gait Details: Pt was able to ambulate to doorway of room and return without CGA at times due to unsteadiness however no intervention required.   Stairs    General stair comments: pt unwilling to leave room        Cognition Arousal/Alertness: Awake/alert Behavior During Therapy: Flat affect Overall Cognitive Status: History of cognitive impairments - at baseline Area of Impairment: Attention;Following commands;Safety/judgement;Awareness;Orientation          Orientation Level: Disoriented to;Situation Current Attention Level: Alternating Memory: Decreased recall of precautions;Decreased short-term memory Following Commands: Follows one step commands inconsistently Safety/Judgement: Decreased awareness of safety;Decreased awareness of deficits Awareness: Anticipatory   General Comments: Pt was asleep upon arriving. She easily awakes and was cooperative with constant encouragement and redirecting.             Pertinent Vitals/Pain Pain Assessment: No/denies pain     PT Goals (current goals can now be found in the care plan section) Acute Rehab PT Goals Patient Stated Goal: none stated Progress towards PT goals: Progressing toward goals    Frequency    Min 2X/week      PT Plan Current plan remains appropriate       AM-PAC PT "6 Clicks" Mobility   Outcome Measure  Help needed turning from your back to your side while in a flat bed without using bedrails?: None Help needed moving from lying on your back to sitting on the side of a flat bed without using bedrails?: A  Little Help needed moving to and from a bed to a chair (including a wheelchair)?: A Little Help needed standing up from a chair using your arms (e.g., wheelchair or bedside chair)?: A Little Help needed to walk in hospital room?: A Little Help needed climbing 3-5 steps with a railing? : A Little 6 Click Score:  19    End of Session   Activity Tolerance: Patient tolerated treatment well Patient left: in chair;with call bell/phone within reach;with chair alarm set Nurse Communication: Mobility status PT Visit Diagnosis: Unsteadiness on feet (R26.81)     Time: 2440-1027 PT Time Calculation (min) (ACUTE ONLY): 20 min  Charges:  $Therapeutic Activity: 8-22 mins                     Julaine Fusi PTA 10/18/20, 9:15 AM

## 2020-10-18 NOTE — Progress Notes (Signed)
Pt seen and examined, no changes from my note yesterday -Remains medically stable, CBGs within acceptable range as well -Awaiting long-term placement  Domenic Polite, MD

## 2020-10-18 NOTE — Progress Notes (Signed)
Inpatient Diabetes Program Recommendations  AACE/ADA: New Consensus Statement on Inpatient Glycemic Control (2015)  Target Ranges:  Prepandial:   less than 140 mg/dL      Peak postprandial:   less than 180 mg/dL (1-2 hours)      Critically ill patients:  140 - 180 mg/dL   Lab Results  Component Value Date   GLUCAP 123 (H) 10/18/2020   HGBA1C 10.2 (H) 09/14/2020    Review of Glycemic Control Results for GITEL, BESTE (MRN [id number]) as of 10/18/2020 10:49  Ref. Range 10/17/2020 07:41 10/17/2020 11:37 10/17/2020 15:16 10/17/2020 15:59 10/17/2020 21:04 10/18/2020 07:44  Glucose-Capillary Latest Ref Range: 70 - 99 mg/dL 80 223 (H) 202 (H) 215 (H) 154 (H) 123 (H)   Diabetes history: DM 2 Outpatient Diabetes medications: Ozempic 1 mg weekly Current orders for Inpatient glycemic control:  Levemir 12 units bid Novolog 0-15 units tid + hs  Inpatient Diabetes Program Recommendations:    Glucose tends increase after meal intake  -  consider adding Novolog 3 units tid meal coverage if eating >50% of meals  Thanks,  Tama Headings RN, MSN, BC-ADM Inpatient Diabetes Coordinator Team Pager [phone] (8a-5p)

## 2020-10-18 NOTE — TOC Progression Note (Signed)
Transition of Care [facility]) - Progression Note    Patient Details  Name: Daisy Watkins MRN: [id number] Date of Birth: Jul 31, 1932  [facility]) CM/SW Contact  Daisy Hutching, RN Phone Number: 10/18/2020, 2:46 PM  Clinical Narrative:    RNCM met with patient at the bedside today, patient's sister is visiting with her today.  Patient is much more alert and interactive than last week.  Patient is smiling and joking. RNCM reached out to Daisy Watkins at [facility] to see if she could come out to re-evaluate patient for their facility now that she is more alert and interactive and participating in her care.  Daisy Watkins will have to check her schedule to see when she can come out.  Patient also has a Insurance account manager assigned at [employer], Daisy Watkins.  [email or web address] or [email or web address]      Expected Discharge Plan: Assisted Living Barriers to Discharge: Continued Medical Work up  Expected Discharge Plan and Services Expected Discharge Plan: Assisted Living   Discharge Planning Services: CM Consult   Living arrangements for the past 2 months: Single Family Home Expected Discharge Date: 10/03/20               DME Arranged: N/A DME Agency: NA       HH Arranged: NA           Social Determinants of Health (SDOH) Interventions    Readmission Risk Interventions No flowsheet data found.

## 2020-10-19 LAB — GLUCOSE, CAPILLARY
Glucose-Capillary: 90 mg/dL (ref 70–99)
Glucose-Capillary: 163 mg/dL — ABNORMAL HIGH (ref 70–99)
Glucose-Capillary: 165 mg/dL — ABNORMAL HIGH (ref 70–99)
Glucose-Capillary: 224 mg/dL — ABNORMAL HIGH (ref 70–99)

## 2020-10-19 NOTE — Progress Notes (Signed)
Pt seen and examined, pleasant today -remains stable awaiting long term SNF -CBGs are stable  Domenic Polite, MD

## 2020-10-20 LAB — GLUCOSE, CAPILLARY
Glucose-Capillary: 58 mg/dL — ABNORMAL LOW (ref 70–99)
Glucose-Capillary: 66 mg/dL — ABNORMAL LOW (ref 70–99)
Glucose-Capillary: 217 mg/dL — ABNORMAL HIGH (ref 70–99)
Glucose-Capillary: 131 mg/dL — ABNORMAL HIGH (ref 70–99)
Glucose-Capillary: 106 mg/dL — ABNORMAL HIGH (ref 70–99)

## 2020-10-20 MED ORDER — INSULIN DETEMIR 100 UNIT/ML ~~LOC~~ SOLN
10.0000 [IU] | Freq: Two times a day (BID) | SUBCUTANEOUS | Status: DC
  Administered 2020-10-20 – 2020-10-25 (×11): 10 [IU] via SUBCUTANEOUS
  Filled 2020-10-20 (×14): qty 0.1

## 2020-10-20 NOTE — Progress Notes (Signed)
Entered patient room after noticing food and liquid all over floor. When asked why she had thrown her food again, pt stated "I told you I want to go home." Pt again educated regarding inappropriate behavior. Pt replied, "oh.... it won't help me go home?! I won't do it again."

## 2020-10-20 NOTE — Progress Notes (Signed)
Patient throwing remainder of breakfast and drinks across room. When asked why she was throwing things patient stated, "I just want to go home" and began to cry. Pt reminded inappropriate behaviors like throwing things wouldn't help the process of getting her home. Pt also re-oriented to time and assured that she would be taken care of here over the weekend. Pt agreeable to eating some cereal and taking her AM medications.

## 2020-10-20 NOTE — Progress Notes (Addendum)
Pt seen and examined, again noted to have food all over the floor, she told me she did this as she was angry -decrease lantus dose for am hypoglycemia  Otherwise remains medically stable, awaiting safe dispo  Domenic Polite, MD

## 2020-10-21 DIAGNOSIS — G9341 Metabolic encephalopathy: Secondary | ICD-10-CM | POA: Diagnosis not present

## 2020-10-21 LAB — GLUCOSE, CAPILLARY
Glucose-Capillary: 100 mg/dL — ABNORMAL HIGH (ref 70–99)
Glucose-Capillary: 144 mg/dL — ABNORMAL HIGH (ref 70–99)
Glucose-Capillary: 228 mg/dL — ABNORMAL HIGH (ref 70–99)
Glucose-Capillary: 246 mg/dL — ABNORMAL HIGH (ref 70–99)

## 2020-10-21 NOTE — Progress Notes (Signed)
Patient seen and examined, no changes from prior, in good spirits today -CBGs are stable -Remains medically stable, awaiting placement  Domenic Polite, MD

## 2020-10-21 NOTE — Plan of Care (Signed)
°  Problem: Education: °Goal: Knowledge of General Education information will improve °Description: Including pain rating scale, medication(s)/side effects and non-pharmacologic comfort measures °Outcome: Progressing °  °Problem: Health Behavior/Discharge Planning: °Goal: Ability to manage health-related needs will improve °Outcome: Progressing °  °Problem: Clinical Measurements: °Goal: Ability to maintain clinical measurements within normal limits will improve °Outcome: Progressing °Goal: Will remain free from infection °Outcome: Progressing °Goal: Diagnostic test results will improve °Outcome: Progressing °  °Problem: Activity: °Goal: Risk for activity intolerance will decrease °Outcome: Progressing °  °Problem: Nutrition: °Goal: Adequate nutrition will be maintained °Outcome: Progressing °  °Problem: Coping: °Goal: Level of anxiety will decrease °Outcome: Progressing °  °Problem: Pain Managment: °Goal: General experience of comfort will improve °Outcome: Progressing °  °Problem: Safety: °Goal: Ability to remain free from injury will improve °Outcome: Progressing °  °Problem: Skin Integrity: °Goal: Risk for impaired skin integrity will decrease °Outcome: Progressing °  °

## 2020-10-21 NOTE — Progress Notes (Signed)
Patient had visitor at bedside for less than 1 hour. Pt threw drink and other items across room as soon as visitor left. Pt stated she was "mad" they left and "didn't take her."

## 2020-10-22 LAB — GLUCOSE, CAPILLARY
Glucose-Capillary: 172 mg/dL — ABNORMAL HIGH (ref 70–99)
Glucose-Capillary: 176 mg/dL — ABNORMAL HIGH (ref 70–99)
Glucose-Capillary: 183 mg/dL — ABNORMAL HIGH (ref 70–99)
Glucose-Capillary: 242 mg/dL — ABNORMAL HIGH (ref 70–99)

## 2020-10-22 NOTE — Progress Notes (Signed)
Pt seen and examined Resting comfortably CBGs are stable Awaiting safe Dispo  Domenic Polite, MD

## 2020-10-22 NOTE — TOC Progression Note (Addendum)
Transition of Care [facility]) - Progression Note    Patient Details  Name: RHYLEI EMICK MRN: [id number] Date of Birth: 18-Aug-1932  Transition of Care [facility]) CM/SW Contact  Shelbie Hutching, RN Phone Number: 10/22/2020, 2:36 PM  Clinical Narrative:    RNCM reached out to Melida Quitter, who has several group homes in the area.  He reports that he would need to come and evaluate her before saying yes.  RNCM will also follow up with [facility] - they did not have a bed last week.    Tammy at [facility] reports that they do have a bed opening up this week but it will be towards the end of the week.   Tammy says that she will try and evaluate the patient tomorrow.    Expected Discharge Plan: Assisted Living Barriers to Discharge: Family Issues, Other (must enter comment) (Medicaid application pending-no ALF bed)  Expected Discharge Plan and Services Expected Discharge Plan: Assisted Living   Discharge Planning Services: CM Consult   Living arrangements for the past 2 months: Single Family Home Expected Discharge Date: 10/03/20               DME Arranged: N/A DME Agency: NA       HH Arranged: NA           Social Determinants of Health (SDOH) Interventions    Readmission Risk Interventions No flowsheet data found.

## 2020-10-22 NOTE — Progress Notes (Signed)
PT Cancellation Note  Patient Details Name: Daisy Watkins MRN: [id number] DOB: Nov 22, 1932   Cancelled Treatment:    Reason Eval/Treat Not Completed: Patient declined, no reason specified. Medical chart reviewed upon entry to room. Pt sleeping and easily aroused to voice. Pt denying PT services at this time. Wishes for PT to return later this afternoon. Will Re-attempt if able.   Salem Caster. Fairly IV, PT, DPT Physical Therapist- [facility]  10/22/2020, 1:41 PM

## 2020-10-22 NOTE — Plan of Care (Signed)
Pt continues to throw items in the floor.  When asked why she does that, she replied "just for attention".  Pt has been incontinent of urine x 2 tonight and has not called out for help to the bathroom.  Bed alarm remains intact. Ayesha Mohair BSN RN CMSRN  Problem: Health Behavior/Discharge Planning: Goal: Ability to manage health-related needs will improve Outcome: Not Progressing

## 2020-10-23 DIAGNOSIS — G9341 Metabolic encephalopathy: Secondary | ICD-10-CM | POA: Diagnosis not present

## 2020-10-23 LAB — GLUCOSE, CAPILLARY
Glucose-Capillary: 247 mg/dL — ABNORMAL HIGH (ref 70–99)
Glucose-Capillary: 195 mg/dL — ABNORMAL HIGH (ref 70–99)
Glucose-Capillary: 228 mg/dL — ABNORMAL HIGH (ref 70–99)
Glucose-Capillary: 190 mg/dL — ABNORMAL HIGH (ref 70–99)

## 2020-10-23 NOTE — Progress Notes (Signed)
PROGRESS NOTE  [name]  DOB: 1932/07/14  PCP: Valerie Roys, DO [id number]  DOA: 09/30/2020  LOS: 17 days  Hospital Day: 24   Chief Complaint  Patient presents with   Altered Mental Status    Brief narrative: Daisy Watkins is a 87 y.o. female with PMH significant for chronic dementia with behavioral disturbances, Meningioma (09/14/20 MRI), HTN, DM2, CAD, Afib/DVT (on Eliquis). Patient presented to the ED on 7/3 with acute confusion, agitation with intermittent crying, and difficulty taking care of herself and taking home medications.   -remains medically stable awaiting patient placement  Subjective: -Seen and examined, denies any complaints, good spirits today per staff  Assessment/Plan:  Chronic Dementia with Behavioral Disturbances -Currently patient is alert, awake, oriented to self and partly to place -No longer restless or agitated -Continue Lexapro at 20 mg daily, Depakote to 1000 mg daily ( for seizures) -stable on Ativan 0.5 mg BID PRN for anxiety -labs, x-ray, urinalysis, CT head unremarkable -Stable awaiting safe disposition, TOC following   Left Anterior Lobe Meningioma -On Depakote for seizure control.   -Patient was evaluated by Neurosurgery on 09/14/20 admission and was deemed to be a poor surgical candidate.   Paroxysmal Atrial Fibrillation -Coreg discontinued this admission due to bradycardia -Continue Eliquis 5 mg BID for anticoagulation.   History of DVT, unprovoked -Continue Eliquis 5 mg BID for anticoagulation.   Essential Hypertension -Blood pressure controlled on ramipril  Uncontrolled type 2 diabetes mellitus -A1c 10.2 on 09/14/2020 -Currently on Levemir 10 units BID -Blood sugar level overall stable.  Coronary Artery Disease -On Eliquis.  Unable to tolerate statins   Constipation -Resolved -Continue Senokot, MiraLAX  Lactobacillus Urine Cuture - normal flora of female GU tract / contaminant.  DVT prophylaxis: Eliquis  CODE  STATUS: Full code Family Communication: None at bedside  Status is: Inpatient  Remains inpatient appropriate because: Patient needs placement  Dispo: The patient is from: Home              Anticipated d/c is to: Long-term placement              Patient currently is medically stable to d/c.   Difficult to place patient No   Scheduled Meds:  apixaban  5 mg Oral BID   divalproex  1,000 mg Oral Daily   escitalopram  20 mg Oral Daily   feeding supplement (GLUCERNA SHAKE)  237 mL Oral TID BM   insulin aspart  0-15 Units Subcutaneous TID WC   insulin aspart  0-5 Units Subcutaneous QHS   insulin detemir  10 Units Subcutaneous BID   multivitamin with minerals  1 tablet Oral Daily   polyethylene glycol  17 g Oral Daily   ramipril  5 mg Oral Daily   senna-docusate  1 tablet Oral QHS   thiamine  100 mg Oral Daily   vitamin B-12  1,000 mcg Oral Daily    Antimicrobials: Anti-infectives (From admission, onward)    None       PRN meds: acetaminophen, acetaminophen, hydrALAZINE, LORazepam, mineral oil, ondansetron (ZOFRAN) IV   Objective: Vitals:   10/23/20 1153 10/23/20 1154  BP: (!) 134/47   Pulse: (!) 56   Resp: 13 15  Temp: 98.1 F (36.7 C)   SpO2: 97%    No intake or output data in the 24 hours ending 10/23/20 1345 Filed Weights   09/30/20 1031 10/11/20 0601  Weight: 74.5 kg 75.1 kg   Weight change:  Body mass index is 26.[place]  kg/m.   Physical Exam: Gen: Gen: Awake, Alert, Oriented X 2, hard of hearing HEENT: no JVD Lungs: Good air movement bilaterally, CTAB CVS: S1S2/RRR Abd: soft, Non tender, non distended, BS present Extremities: No edema Skin: no new rashes on exposed skin  Psych: Poor insight and judgment  Data Review: I have personally reviewed the laboratory data and studies available.  Recent Labs  Lab 10/18/20 0530  WBC 4.6  HGB 12.0  HCT 37.0  MCV 89.6  PLT 156   Recent Labs  Lab 10/18/20 0530  NA 139  K 3.9  CL 103  CO2 28  GLUCOSE  193*  BUN 17  CREATININE 0.59  CALCIUM 9.3     F/u labs not needed Unresulted Labs (From admission, onward)    None       Signed, Domenic Polite, MD [facility] 10/23/2020

## 2020-10-23 NOTE — TOC Progression Note (Signed)
Transition of Care [facility]) - Progression Note    Patient Details  Name: Daisy Watkins MRN: [id number] Date of Birth: [birth year]  [facility]) CM/SW Contact  Shelbie Hutching, RN Phone Number: 10/23/2020, 3:12 PM  Clinical Narrative:    Tammy from [place] was unable to get to the hospital today but will try to come in tomorrow to eval patient.    Expected Discharge Plan: Assisted Living Barriers to Discharge: Family Issues, Other (must enter comment) (Medicaid application pending-no ALF bed)  Expected Discharge Plan and Services Expected Discharge Plan: Assisted Living   Discharge Planning Services: CM Consult   Living arrangements for the past 2 months: Single Family Home Expected Discharge Date: 10/03/20               DME Arranged: N/A DME Agency: NA       HH Arranged: NA           Social Determinants of Health (SDOH) Interventions    Readmission Risk Interventions No flowsheet data found.

## 2020-10-23 NOTE — Progress Notes (Signed)
Nutrition Follow-up  DOCUMENTATION CODES:  Not applicable  INTERVENTION:  Continue current diet as ordered  Glucerna Shake po TID, each supplement provides 220 kcal and 10 grams of protein Multivitamin with minerals daily Nursing to assist pt with meals New measured weight to assess for weight loss.  NUTRITION DIAGNOSIS:  Inadequate oral intake related to poor appetite as evidenced by per patient/family report, meal completion < 50%.  GOAL:  Patient will meet greater than or equal to 90% of their needs  MONITOR:  PO intake, Supplement acceptance, Weight trends  REASON FOR ASSESSMENT:  Malnutrition Screening Tool    ASSESSMENT:  87 y.o. female brought to ED from home with AMS. Family reports that pt was agitated earlier in the day and then became unresponsive. PMH relevant for DM, GERD, HTN, HLD, Vitamin B12 and D deficiencies, and atrial fibrillation.  Pt recently admitted for AMS and imaging obtained during that admission showed L anterior temporal meningioma with associated vasogenic edema. No significant change in size from last admission when re-imaged. Neurology and Psychiatry consulting.   Slight improvement in average intake since last assessment. Staff report that pt is more engaged and alert this week. Continues to routinely accept nutrition supplements. No weight entered last week, will request again. Pt remains medically stable awaiting discharge.  Average Meal Intake: 7/3-7/5: 0% intake x 1 recorded meal 7/6-7/14: 15% intake x 23 recorded meals (0-100%) 7/15-7/20: 33% intake x 3 recorded meals (25-50%) 7/21-7/26: 50% intake x 2 recorded meals  Nutritionally Relevant Medications: Scheduled Meds:  insulin aspart  0-15 Units Subcutaneous TID WC   insulin aspart  0-5 Units Subcutaneous QHS   insulin detemir  10 Units Subcutaneous BID   multivitamin with minerals  1 tablet Oral Daily   polyethylene glycol  17 g Oral Daily   senna-docusate  1 tablet Oral QHS    thiamine  100 mg Oral Daily   vitamin B-12  1,000 mcg Oral Daily   PRN Meds: mineral oil, ondansetron  Labs Reviewed: SBG ranges from 176-247 mg/dL over the last 24 hours HgbA1c 10.2% (6/17)  NUTRITION - FOCUSED PHYSICAL EXAM: Flowsheet Row Most Recent Value  Orbital Region No depletion  Upper Arm Region No depletion  Thoracic and Lumbar Region No depletion  Buccal Region No depletion  Temple Region No depletion  Clavicle Bone Region No depletion  Clavicle and Acromion Bone Region No depletion  Scapular Bone Region No depletion  Dorsal Hand No depletion  Patellar Region No depletion  Anterior Thigh Region No depletion  Posterior Calf Region No depletion  Edema (RD Assessment) Mild  Hair Reviewed  Eyes Reviewed  Mouth Reviewed  Skin Reviewed  Nails Reviewed   Diet Order:   Diet Order             Diet Carb Modified Fluid consistency: Thin; Room service appropriate? Yes  Diet effective now                   EDUCATION NEEDS:  No education needs have been identified at this time  Skin:  Skin Assessment: Reviewed RN Assessment  Last BM:  7/23 - type 4   Height:  Ht Readings from Last 1 Encounters:  09/30/20 '5\' 6"'$  (1.676 m)    Weight:  Wt Readings from Last 1 Encounters:  10/11/20 75.1 kg    Ideal Body Weight:  59.1 kg  BMI:  Body mass index is 26.72 kg/m.  Estimated Nutritional Needs:  Kcal:  1600-1800 kcal/d Protein:  80-90 g/d  Fluid:  1.8-2L/d   Ranell Patrick, RD, LDN Clinical Dietitian Pager on [place]

## 2020-10-23 NOTE — Plan of Care (Signed)
  Problem: Nutrition: Goal: Adequate nutrition will be maintained Outcome: Progressing   Problem: Skin Integrity: Goal: Risk for impaired skin integrity will decrease Outcome: Progressing   Problem: Coping: Goal: Level of anxiety will decrease Outcome: Not Progressing   Problem: Safety: Goal: Ability to remain free from injury will improve Note: Throwing anything within her reach on the floor

## 2020-10-24 DIAGNOSIS — E1165 Type 2 diabetes mellitus with hyperglycemia: Secondary | ICD-10-CM | POA: Diagnosis not present

## 2020-10-24 DIAGNOSIS — D329 Benign neoplasm of meninges, unspecified: Secondary | ICD-10-CM | POA: Diagnosis not present

## 2020-10-24 DIAGNOSIS — G9341 Metabolic encephalopathy: Secondary | ICD-10-CM | POA: Diagnosis not present

## 2020-10-24 DIAGNOSIS — Z794 Long term (current) use of insulin: Secondary | ICD-10-CM | POA: Diagnosis not present

## 2020-10-24 LAB — GLUCOSE, CAPILLARY
Glucose-Capillary: 211 mg/dL — ABNORMAL HIGH (ref 70–99)
Glucose-Capillary: 221 mg/dL — ABNORMAL HIGH (ref 70–99)
Glucose-Capillary: 281 mg/dL — ABNORMAL HIGH (ref 70–99)
Glucose-Capillary: 280 mg/dL — ABNORMAL HIGH (ref 70–99)

## 2020-10-24 NOTE — Progress Notes (Signed)
Physical Therapy Treatment Patient Details Name: Daisy Watkins MRN: [id number] DOB: 24-May-1932 Today's Date: 10/24/2020    History of Present Illness 87 y/o admitted on 09/30/20 with c/c of AMS with confusion and agitation. Pt recently d/c on 09/15/20 after being admitted for change in mental status. PMH: anxiety, DVT, DM, GERD, insomnia, HTN, HLD, insomnia, myalgia.    PT Comments    Pt is in bed with positioning on her L side, and let PT get her into midline.  Pt did assisted ex and then repositioned again.  On the way out, pt was given her remote and promptly threw it at PT.  Will continue to work on mobility but will need to be potentially supervised by nursing to be up in the chair.  Follow along with pt as tolerated, recommending inpt or assisted living care since she is not able to make safe choices.  Follow Up Recommendations  Supervision/Assistance - 24 hour;Supervision for mobility/OOB     Equipment Recommendations  None recommended by PT    Recommendations for Other Services       Precautions / Restrictions Precautions Precautions: Fall Precaution Comments: throwing objects Restrictions Weight Bearing Restrictions: No    Mobility  Bed Mobility Overal bed mobility: Needs Assistance             General bed mobility comments: total assist to  move on bed    Transfers                 General transfer comment: declines  Ambulation/Gait             General Gait Details: declines   Stairs             Wheelchair Mobility    Modified Rankin (Stroke Patients Only)       Balance                                            Cognition Arousal/Alertness: Awake/alert Behavior During Therapy: Flat affect Overall Cognitive Status: History of cognitive impairments - at baseline Area of Impairment: Safety/judgement;Attention;Problem solving                 Orientation Level: Situation Current Attention Level:  Selective Memory: Decreased short-term memory Following Commands: Follows one step commands inconsistently;Follows one step commands with increased time Safety/Judgement: Decreased awareness of safety Awareness: Anticipatory;Intellectual Problem Solving: Slow processing;Requires verbal cues;Requires tactile cues General Comments: with encouragement was able to get her to do ex but not oob      Exercises General Exercises - Lower Extremity Ankle Circles/Pumps: AAROM;5 reps Heel Slides: AAROM;10 reps Hip ABduction/ADduction: AAROM;10 reps Straight Leg Raises: AAROM;10 reps Hip Flexion/Marching: AAROM;10 reps    General Comments General comments (skin integrity, edema, etc.): pt was able to be assisted with ex's but is reluctant to get up despite mult encouraging comments      Pertinent Vitals/Pain Pain Assessment: No/denies pain    Home Living                      Prior Function            PT Goals (current goals can now be found in the care plan section) Acute Rehab PT Goals Patient Stated Goal: none stated Progress towards PT goals: Not progressing toward goals - comment    Frequency    Min  2X/week      PT Plan Current plan remains appropriate    Co-evaluation              AM-PAC PT "6 Clicks" Mobility   Outcome Measure  Help needed turning from your back to your side while in a flat bed without using bedrails?: None Help needed moving from lying on your back to sitting on the side of a flat bed without using bedrails?: A Little Help needed moving to and from a bed to a chair (including a wheelchair)?: A Little Help needed standing up from a chair using your arms (e.g., wheelchair or bedside chair)?: A Little Help needed to walk in hospital room?: A Little Help needed climbing 3-5 steps with a railing? : A Lot 6 Click Score: 18    End of Session   Activity Tolerance: Patient tolerated treatment well Patient left: in bed;with call bell/phone  within reach;with bed alarm set Nurse Communication: Mobility status PT Visit Diagnosis: Unsteadiness on feet (R26.81)     Time: [id number] PT Time Calculation (min) (ACUTE ONLY): 15 min  Charges:  $Therapeutic Exercise: 8-22 mins          BAXLEY TULLO 10/24/2020, 10:06 AM  Mee Hives, PT MS Acute Rehab Dept. Number: [place] and [place]

## 2020-10-24 NOTE — Progress Notes (Signed)
PROGRESS NOTE    Daisy Watkins  [id number] DOB: 1932/05/21 DOA: 09/30/2020 PCP: Valerie Roys, DO   Chief complaint.  Altered mental status. Brief Narrative:  Daisy Watkins is a 87 y.o. female with PMH significant for chronic dementia with behavioral disturbances, Meningioma (09/14/20 MRI), HTN, DM2, CAD, Afib/DVT (on Eliquis). Patient presented to the ED on 7/3 with acute confusion, agitation with intermittent crying, and difficulty taking care of herself and taking home medications.   -remains medically stable awaiting patient placement   Assessment & Plan:   Principal Problem:   Acute metabolic encephalopathy Active Problems:   Type 2 diabetes mellitus with hyperglycemia (HCC)   Coronary artery disease   Benign essential hypertension   Paroxysmal A-fib (HCC)   Anxiety, generalized   History of DVT (deep vein thrombosis)   Psychosis in elderly with behavioral disturbance (HCC)   AMS (altered mental status)   Meningioma (HCC)   Hypokalemia  #1.  Dementia with behavioral disturbances. Left anterior lobe meningioma. Continue antidepressants, Depakote and as needed Ativan for seizure. Is not a surgical candidate for meningioma.  2.  Paroxysmal atrial fibrillation. History of DVT PE Continue Eliquis.  3.  Uncontrolled type 2 diabetes with hyperglycemia Continue Levemir and sliding scale insulin.  4.  Coronary artery disease. Continue to follow.      DVT prophylaxis: Eliquis Code Status: Full Family Communication:  Disposition Plan:    Status is: Inpatient  Remains inpatient appropriate because:Unsafe d/c plan  Dispo: The patient is from: Home              Anticipated d/c is to: ALF              Patient currently is medically stable to d/c.   Difficult to place patient Yes        I/O last 3 completed shifts: In: 237 [P.O.:237] Out: -  No intake/output data recorded.     Consultants:  None  Procedures: None  Antimicrobials:  None  Subjective: Patient is confused, which is her baseline. Denies any abdominal pain or nausea vomiting. No fever or chills No short of breath or cough. No dysuria or hematuria.  Objective: Vitals:   10/23/20 2007 10/24/20 0435 10/24/20 0811 10/24/20 1159  BP: (!) 143/46 (!) 137/58 (!) 115/50 (!) 142/53  Pulse: 63 67 67 63  Resp: '18 18 16 16  '$ Temp: 97.9 F (36.6 C) (!) 97.5 F (36.4 C) 98.1 F (36.7 C) 98.2 F (36.8 C)  TempSrc:  Oral    SpO2: 96% 99% 97% 97%  Weight:      Height:        Intake/Output Summary (Last 24 hours) at 10/24/2020 1334 Last data filed at 10/24/2020 0500 Gross per 24 hour  Intake 237 ml  Output --  Net 237 ml   Filed Weights   09/30/20 1031 10/11/20 0601 10/23/20 1409  Weight: 74.5 kg 75.1 kg 76.1 kg    Examination:  General exam: Appears calm and comfortable  Respiratory system: Clear to auscultation. Respiratory effort normal. Cardiovascular system: S1 & S2 heard, RRR. No JVD, murmurs, rubs, gallops or clicks. No pedal edema. Gastrointestinal system: Abdomen is nondistended, soft and nontender. No organomegaly or masses felt. Normal bowel sounds heard. Central nervous system: Alert and oriented x1. No focal neurological deficits. Extremities: Symmetric 5 x 5 power. Skin: No rashes, lesions or ulcers     Data Reviewed: I have personally reviewed following labs and imaging studies  CBC: Recent Labs  Lab  10/18/20 0530  WBC 4.6  HGB 12.0  HCT 37.0  MCV 89.6  PLT [id number]   Basic Metabolic Panel: Recent Labs  Lab 10/18/20 0530  NA 139  K 3.9  CL 103  CO2 28  GLUCOSE 193*  BUN 17  CREATININE 0.59  CALCIUM 9.3   GFR: Estimated Creatinine Clearance: 52.6 mL/min (by C-G formula based on SCr of 0.59 mg/dL). Liver Function Tests: No results for input(s): AST, ALT, ALKPHOS, BILITOT, PROT, ALBUMIN in the last 168 hours. No results for input(s): LIPASE, AMYLASE in the last 168 hours. No results for input(s): AMMONIA in the last  168 hours. Coagulation Profile: No results for input(s): INR, PROTIME in the last 168 hours. Cardiac Enzymes: No results for input(s): CKTOTAL, CKMB, CKMBINDEX, TROPONINI in the last 168 hours. BNP (last 3 results) No results for input(s): PROBNP in the last 8760 hours. HbA1C: No results for input(s): HGBA1C in the last 72 hours. CBG: Recent Labs  Lab 10/23/20 1153 10/23/20 1630 10/23/20 2004 10/24/20 0813 10/24/20 1201  GLUCAP 195* 228* 190* 211* 221*   Lipid Profile: No results for input(s): CHOL, HDL, LDLCALC, TRIG, CHOLHDL, LDLDIRECT in the last 72 hours. Thyroid Function Tests: No results for input(s): TSH, T4TOTAL, FREET4, T3FREE, THYROIDAB in the last 72 hours. Anemia Panel: No results for input(s): VITAMINB12, FOLATE, FERRITIN, TIBC, IRON, RETICCTPCT in the last 72 hours. Sepsis Labs: No results for input(s): PROCALCITON, LATICACIDVEN in the last 168 hours.  Recent Results (from the past 240 hour(s))  Urine Culture     Status: Abnormal   Collection Time: 10/16/20  2:30 PM   Specimen: In/Out Cath Urine  Result Value Ref Range Status   Specimen Description   Final    IN/OUT CATH URINE Performed at [facility], [address] [place], [place] [address]    Special Requests   Final    NONE Performed at [facility], [address] [place], [place] [address]    Culture (A)  Final    >=100,000 COLONIES/mL LACTOBACILLUS SPECIES Standardized susceptibility testing for this organism is not available. Performed at [facility], [place] [address], [place],  [address]    Report Status 10/18/2020 FINAL  Final         Radiology Studies: No results found.      Scheduled Meds:  apixaban  5 mg Oral BID   divalproex  1,000 mg Oral Daily   escitalopram  20 mg Oral Daily   feeding supplement (GLUCERNA SHAKE)  237 mL Oral TID BM   insulin aspart  0-15 Units Subcutaneous TID WC   insulin aspart  0-5 Units Subcutaneous QHS   insulin  detemir  10 Units Subcutaneous BID   multivitamin with minerals  1 tablet Oral Daily   polyethylene glycol  17 g Oral Daily   ramipril  5 mg Oral Daily   senna-docusate  1 tablet Oral QHS   thiamine  100 mg Oral Daily   vitamin B-12  1,000 mcg Oral Daily   Continuous Infusions:   LOS: 23 days    Time spent: 27 minutes    Sharen Hones, MD [facility]   To contact the attending provider between 7A-7P or the covering provider during after hours 7P-7A, please log into the web site [email or web address] and access using universal [place] password for that web site. If you do not have the password, please call the hospital operator.  10/24/2020, 1:34 PM

## 2020-10-25 DIAGNOSIS — G9341 Metabolic encephalopathy: Secondary | ICD-10-CM | POA: Diagnosis not present

## 2020-10-25 DIAGNOSIS — I2510 Atherosclerotic heart disease of native coronary artery without angina pectoris: Secondary | ICD-10-CM | POA: Diagnosis not present

## 2020-10-25 DIAGNOSIS — I10 Essential (primary) hypertension: Secondary | ICD-10-CM | POA: Diagnosis not present

## 2020-10-25 LAB — CBC
HCT: 37.1 % (ref 36.0–46.0)
Hemoglobin: 12.1 g/dL (ref 12.0–15.0)
MCH: 29.5 pg (ref 26.0–34.0)
MCHC: 32.6 g/dL (ref 30.0–36.0)
MCV: 90.5 fL (ref 80.0–100.0)
Platelets: 156 10*3/uL (ref 150–400)
RBC: 4.1 MIL/uL (ref 3.87–5.11)
RDW: 13.4 % (ref 11.5–15.5)
WBC: 5.7 10*3/uL (ref 4.0–10.5)
nRBC: 0 % (ref 0.0–0.2)

## 2020-10-25 LAB — GLUCOSE, CAPILLARY
Glucose-Capillary: 139 mg/dL — ABNORMAL HIGH (ref 70–99)
Glucose-Capillary: 186 mg/dL — ABNORMAL HIGH (ref 70–99)
Glucose-Capillary: 274 mg/dL — ABNORMAL HIGH (ref 70–99)
Glucose-Capillary: 251 mg/dL — ABNORMAL HIGH (ref 70–99)

## 2020-10-25 NOTE — Progress Notes (Signed)
PROGRESS NOTE    [name]  [id number] DOB: 05/20/1932 DOA: 09/30/2020 PCP: Valerie Roys, DO    Brief Narrative:  Daisy Watkins is a 87 y.o. female with PMH significant for chronic dementia with behavioral disturbances, Meningioma (09/14/20 MRI), HTN, DM2, CAD, Afib/DVT (on Eliquis). Patient presented to the ED on 7/3 with acute confusion, agitation with intermittent crying, and difficulty taking care of herself and taking home medications.   -remains medically stable awaiting patient placement   Assessment & Plan:   Principal Problem:   Acute metabolic encephalopathy Active Problems:   Type 2 diabetes mellitus with hyperglycemia (HCC)   Coronary artery disease   Benign essential hypertension   Paroxysmal A-fib (HCC)   Anxiety, generalized   History of DVT (deep vein thrombosis)   Psychosis in elderly with behavioral disturbance (HCC)   AMS (altered mental status)   Meningioma (HCC)   Hypokalemia  #1.  Dementia with behavioral disturbances. Left anterior lobe meningioma. Conditions are stable.  2.  Paroxysmal atrial fibrillation. History of DVT PE Continue anticoagulation with Eliquis.  3.  Controlled type 2 diabetes with hyperglycemia. Continue current regimen.  4.  Coronary artery disease. Stable.  DVT prophylaxis: Eliquis Code Status: Full Family Communication:  Disposition Plan:    Status is: Inpatient  Remains inpatient appropriate because:Unsafe d/c plan  Dispo: The patient is from: Home              Anticipated d/c is to: ALF              Patient currently is medically stable to d/c.   Difficult to place patient Yes        I/O last 3 completed shifts: In: 357 [P.O.:357] Out: -  No intake/output data recorded.     Consultants:  None  Procedures: None  Antimicrobials: None  Subjective: Patient is doing well, she has good appetite without nausea vomiting. No abdominal pain. No short of breath or cough. No dysuria  hematuria. No fever or chills.  Objective: Vitals:   10/24/20 1701 10/24/20 2015 10/25/20 0423 10/25/20 0725  BP: 123/63 (!) 133/59 (!) 135/45 128/64  Pulse: 69 63 (!) 58 (!) 59  Resp: '16 18 15 14  '$ Temp: 98.3 F (36.8 C) 98.4 F (36.9 C) (!) 97.5 F (36.4 C) 97.8 F (36.6 C)  TempSrc:      SpO2: 98% 97% 96% 98%  Weight:      Height:        Intake/Output Summary (Last 24 hours) at 10/25/2020 1123 Last data filed at 10/24/2020 1400 Gross per 24 hour  Intake 120 ml  Output --  Net 120 ml   Filed Weights   09/30/20 1031 10/11/20 0601 10/23/20 1409  Weight: 74.5 kg 75.1 kg 76.1 kg    Examination:  General exam: Appears calm and comfortable  Respiratory system: Clear to auscultation. Respiratory effort normal. Cardiovascular system: S1 & S2 heard, RRR. No JVD, murmurs, rubs, gallops or clicks. No pedal edema. Gastrointestinal system: Abdomen is nondistended, soft and nontender. No organomegaly or masses felt. Normal bowel sounds heard. Central nervous system: Alert and oriented x2.  No focal neurological deficits. Extremities: Symmetric 5 x 5 power. Skin: No rashes, lesions or ulcers Psychiatry: Mood & affect appropriate.     Data Reviewed: I have personally reviewed following labs and imaging studies  CBC: Recent Labs  Lab 10/25/20 0519  WBC 5.7  HGB 12.1  HCT 37.1  MCV 90.5  PLT 156   Basic  Metabolic Panel: No results for input(s): NA, K, CL, CO2, GLUCOSE, BUN, CREATININE, CALCIUM, MG, PHOS in the last 168 hours. GFR: Estimated Creatinine Clearance: 52.6 mL/min (by C-G formula based on SCr of 0.59 mg/dL). Liver Function Tests: No results for input(s): AST, ALT, ALKPHOS, BILITOT, PROT, ALBUMIN in the last 168 hours. No results for input(s): LIPASE, AMYLASE in the last 168 hours. No results for input(s): AMMONIA in the last 168 hours. Coagulation Profile: No results for input(s): INR, PROTIME in the last 168 hours. Cardiac Enzymes: No results for input(s):  CKTOTAL, CKMB, CKMBINDEX, TROPONINI in the last 168 hours. BNP (last 3 results) No results for input(s): PROBNP in the last 8760 hours. HbA1C: No results for input(s): HGBA1C in the last 72 hours. CBG: Recent Labs  Lab 10/24/20 0813 10/24/20 1201 10/24/20 1700 10/24/20 2006 10/25/20 0841  GLUCAP 211* 221* 281* 280* 139*   Lipid Profile: No results for input(s): CHOL, HDL, LDLCALC, TRIG, CHOLHDL, LDLDIRECT in the last 72 hours. Thyroid Function Tests: No results for input(s): TSH, T4TOTAL, FREET4, T3FREE, THYROIDAB in the last 72 hours. Anemia Panel: No results for input(s): VITAMINB12, FOLATE, FERRITIN, TIBC, IRON, RETICCTPCT in the last 72 hours. Sepsis Labs: No results for input(s): PROCALCITON, LATICACIDVEN in the last 168 hours.  Recent Results (from the past 240 hour(s))  Urine Culture     Status: Abnormal   Collection Time: 10/16/20  2:30 PM   Specimen: In/Out Cath Urine  Result Value Ref Range Status   Specimen Description   Final    IN/OUT CATH URINE Performed at [facility], [address] [place], [place] [address]    Special Requests   Final    NONE Performed at [facility], [address] [place], [place] [address]    Culture (A)  Final    >=100,000 COLONIES/mL LACTOBACILLUS SPECIES Standardized susceptibility testing for this organism is not available. Performed at [facility], [place] [address] [place], [place] [address]    Report Status 10/18/2020 FINAL  Final         Radiology Studies: No results found.      Scheduled Meds:  apixaban  5 mg Oral BID   divalproex  1,000 mg Oral Daily   escitalopram  20 mg Oral Daily   feeding supplement (GLUCERNA SHAKE)  237 mL Oral TID BM   insulin aspart  0-15 Units Subcutaneous TID WC   insulin aspart  0-5 Units Subcutaneous QHS   insulin detemir  10 Units Subcutaneous BID   multivitamin with minerals  1 tablet Oral Daily   polyethylene glycol  17 g Oral Daily   ramipril   5 mg Oral Daily   senna-docusate  1 tablet Oral QHS   thiamine  100 mg Oral Daily   vitamin B-12  1,000 mcg Oral Daily   Continuous Infusions:   LOS: 24 days    Time spent: 23 minutes    Sharen Hones, MD [facility]   To contact the attending provider between 7A-7P or the covering provider during after hours 7P-7A, please log into the web site [email or web address] and access using universal [place] password for that web site. If you do not have the password, please call the hospital operator.  10/25/2020, 11:23 AM

## 2020-10-25 NOTE — TOC Progression Note (Signed)
Transition of Care [facility]) - Progression Note    Patient Details  Name: Daisy Watkins MRN: [id number] Date of Birth: Jan 27, 1933  [facility] of [facility] CM/SW Contact  Shelbie Hutching, RN Phone Number: 10/25/2020, 1:58 PM  Clinical Narrative:    Lynelle Smoke from [facility] can't get away from the facility today but she is planning on coming up tomorrow around 9:30-10.  Recent PT note and FL2 faxed over to [facility].   RNCM spoke with [place], daughter, and asked her to call [place], they had mentioned that they had not heard from her.  [name] will call over to [place] and talk with Pam in the office.     Expected Discharge Plan: Assisted Living Barriers to Discharge: Family Issues, Other (must enter comment) (Medicaid application pending-no ALF bed)  Expected Discharge Plan and Services Expected Discharge Plan: Assisted Living   Discharge Planning Services: CM Consult   Living arrangements for the past 2 months: Single Family Home Expected Discharge Date: 10/03/20               DME Arranged: N/A DME Agency: NA       HH Arranged: NA           Social Determinants of Health (SDOH) Interventions    Readmission Risk Interventions No flowsheet data found.

## 2020-10-25 NOTE — Progress Notes (Signed)
PT Cancellation Note  Patient Details Name: Daisy Watkins MRN: [id number] DOB: Oct 25, 1932   Cancelled Treatment:    Reason Eval/Treat Not Completed: Other (comment): Pt received sitting upright in bed. She pleasantly refused, asking if she could finish her fruit. Will re-attempt later today if time permits.   [name] PT, DPT 10/25/20 12:51 PM [id number]  [name] 10/25/2020, 12:50 PM

## 2020-10-26 DIAGNOSIS — E1165 Type 2 diabetes mellitus with hyperglycemia: Secondary | ICD-10-CM | POA: Diagnosis not present

## 2020-10-26 DIAGNOSIS — D329 Benign neoplasm of meninges, unspecified: Secondary | ICD-10-CM | POA: Diagnosis not present

## 2020-10-26 DIAGNOSIS — Z794 Long term (current) use of insulin: Secondary | ICD-10-CM | POA: Diagnosis not present

## 2020-10-26 DIAGNOSIS — G9341 Metabolic encephalopathy: Secondary | ICD-10-CM | POA: Diagnosis not present

## 2020-10-26 LAB — GLUCOSE, CAPILLARY
Glucose-Capillary: 271 mg/dL — ABNORMAL HIGH (ref 70–99)
Glucose-Capillary: 200 mg/dL — ABNORMAL HIGH (ref 70–99)
Glucose-Capillary: 189 mg/dL — ABNORMAL HIGH (ref 70–99)
Glucose-Capillary: 228 mg/dL — ABNORMAL HIGH (ref 70–99)

## 2020-10-26 MED ORDER — INSULIN DETEMIR 100 UNIT/ML ~~LOC~~ SOLN
14.0000 [IU] | Freq: Two times a day (BID) | SUBCUTANEOUS | Status: DC
  Administered 2020-10-26 (×2): 14 [IU] via SUBCUTANEOUS
  Filled 2020-10-26 (×3): qty 0.14

## 2020-10-26 NOTE — TOC Progression Note (Addendum)
Transition of Care [facility]) - Progression Note    Patient Details  Name: Daisy Watkins MRN: [id number] Date of Birth: 03/30/1933  Transition of Care [facility]) CM/SW [place], LCSW Phone Number: 10/26/2020, 10:05 AM  Clinical Narrative:   CSW called Tammy at [facility] who confirmed she is coming to assess patient at bedside today. She will be here around 10:30 am. Notified RN. Tammy to keep TOC updated.  1:00- Call from Mitchell at [facility]. They are unable to accept patient at this time.  Expected Discharge Plan: Assisted Living Barriers to Discharge: Family Issues, Other (must enter comment) (Medicaid application pending-no ALF bed)  Expected Discharge Plan and Services Expected Discharge Plan: Assisted Living   Discharge Planning Services: CM Consult   Living arrangements for the past 2 months: Single Family Home Expected Discharge Date: 10/03/20               DME Arranged: N/A DME Agency: NA       HH Arranged: NA           Social Determinants of Health (SDOH) Interventions    Readmission Risk Interventions No flowsheet data found.

## 2020-10-26 NOTE — Care Management Important Message (Signed)
Important Message  Patient Details  Name: Daisy Watkins MRN: [id number] Date of Birth: [birth year]   Medicare Important Message Given:  Yes  Directed by RN CM yesterday to contact daughter, [name] [phone]) to review the Important Message from Medicare. I reviewed the form with her and she has the one from the admission and does not need another copy. She asked if she was being discharged today and I replied there is no discharge order in the system at this time. I thanked her for time.  Juliann Pulse A Azriella Mattia 10/26/2020, 2:05 PM

## 2020-10-26 NOTE — Progress Notes (Signed)
PT Cancellation Note  Patient Details Name: Daisy Watkins MRN: [id number] DOB: 1933-02-13   Cancelled Treatment:    Reason Eval/Treat Not Completed: Other (comment). Chart reviewed. Pt received supine in bed, napping and easily aroused. Pt states she does not want to move today. PT offered in bed and out of bed activity. Pt refused. PT offered pillow to pt for increased comfort prior to leaving, pt denied. Will continue to attempt PT treatment on a later date.  [name] PT, DPT 10/26/20 12:16 PM [id number]  [name] 10/26/2020, 12:13 PM

## 2020-10-26 NOTE — Progress Notes (Signed)
PROGRESS NOTE    Daisy Watkins  [id number] DOB: [birth year] DOA: 09/30/2020 PCP: Valerie Roys, DO    Brief Narrative:  Daisy Watkins is a 87 y.o. female with PMH significant for chronic dementia with behavioral disturbances, Meningioma (09/14/20 MRI), HTN, DM2, CAD, Afib/DVT (on Eliquis). Patient presented to the ED on 7/3 with acute confusion, agitation with intermittent crying, and difficulty taking care of herself and taking home medications.   -remains medically stable awaiting patient placement   Assessment & Plan:   Principal Problem:   Acute metabolic encephalopathy Active Problems:   Type 2 diabetes mellitus with hyperglycemia (HCC)   Coronary artery disease   Benign essential hypertension   Paroxysmal A-fib (HCC)   Anxiety, generalized   History of DVT (deep vein thrombosis)   Psychosis in elderly with behavioral disturbance (HCC)   AMS (altered mental status)   Meningioma (HCC)   Hypokalemia  #1.  Dementia with behavioral disturbances. Left anterior lobe meningioma. Stable.  #2.  Uncontrolled type 2 diabetes with hyperglycemia Glucose running high, increase Levemir dose.  DVT prophylaxis: Eliquis Code Status: full Family Communication:  Disposition Plan:    Status is: Inpatient  Remains inpatient appropriate because:Unsafe d/c plan  Dispo: The patient is from: Home              Anticipated d/c is to: ALF              Patient currently is medically stable to d/c.   Difficult to place patient Yes        I/O last 3 completed shifts: In: 240 [P.O.:240] Out: -  No intake/output data recorded.     Consultants:  None  Procedures: None  Antimicrobials: None   Subjective: There is no change in patient's status, she slept well last night, she is less confused today. She denies any short of breath or cough No abdominal pain or nausea vomiting. No dysuria hematuria.  Objective: Vitals:   10/26/20 0012 10/26/20 0455 10/26/20 0715 10/26/20  1207  BP: (!) 146/49 (!) 138/54 (!) 142/52 (!) 154/49  Pulse: (!) 58 70 (!) 59 70  Resp: '16 16 16 18  '$ Temp: 98.1 F (36.7 C) 98.5 F (36.9 C) 97.6 F (36.4 C) 98 F (36.7 C)  TempSrc:      SpO2: 98% 93% 97% 94%  Weight:      Height:        Intake/Output Summary (Last 24 hours) at 10/26/2020 1334 Last data filed at 10/25/2020 2000 Gross per 24 hour  Intake 240 ml  Output --  Net 240 ml   Filed Weights   09/30/20 1031 10/11/20 0601 10/23/20 1409  Weight: 74.5 kg 75.1 kg 76.1 kg    Examination:  General exam: Appears calm and comfortable  Respiratory system: Clear to auscultation. Respiratory effort normal. Cardiovascular system: S1 & S2 heard, RRR. No JVD, murmurs, rubs, gallops or clicks. No pedal edema. Gastrointestinal system: Abdomen is nondistended, soft and nontender. No organomegaly or masses felt. Normal bowel sounds heard. Central nervous system: Alert and oriented x2. No focal neurological deficits. Extremities: Symmetric 5 x 5 power. Skin: No rashes, lesions or ulcers Psychiatry: Mood & affect appropriate.     Data Reviewed: I have personally reviewed following labs and imaging studies  CBC: Recent Labs  Lab 10/25/20 0519  WBC 5.7  HGB 12.1  HCT 37.1  MCV 90.5  PLT [id number]   Basic Metabolic Panel: No results for input(s): NA, K, CL, CO2, GLUCOSE, BUN,  CREATININE, CALCIUM, MG, PHOS in the last 168 hours. GFR: Estimated Creatinine Clearance: 52.6 mL/min (by C-G formula based on SCr of 0.59 mg/dL). Liver Function Tests: No results for input(s): AST, ALT, ALKPHOS, BILITOT, PROT, ALBUMIN in the last 168 hours. No results for input(s): LIPASE, AMYLASE in the last 168 hours. No results for input(s): AMMONIA in the last 168 hours. Coagulation Profile: No results for input(s): INR, PROTIME in the last 168 hours. Cardiac Enzymes: No results for input(s): CKTOTAL, CKMB, CKMBINDEX, TROPONINI in the last 168 hours. BNP (last 3 results) No results for input(s):  PROBNP in the last 8760 hours. HbA1C: No results for input(s): HGBA1C in the last 72 hours. CBG: Recent Labs  Lab 10/25/20 1212 10/25/20 1603 10/25/20 2030 10/26/20 0715 10/26/20 1207  GLUCAP 186* 274* 251* 271* 200*   Lipid Profile: No results for input(s): CHOL, HDL, LDLCALC, TRIG, CHOLHDL, LDLDIRECT in the last 72 hours. Thyroid Function Tests: No results for input(s): TSH, T4TOTAL, FREET4, T3FREE, THYROIDAB in the last 72 hours. Anemia Panel: No results for input(s): VITAMINB12, FOLATE, FERRITIN, TIBC, IRON, RETICCTPCT in the last 72 hours. Sepsis Labs: No results for input(s): PROCALCITON, LATICACIDVEN in the last 168 hours.  Recent Results (from the past 240 hour(s))  Urine Culture     Status: Abnormal   Collection Time: 10/16/20  2:30 PM   Specimen: In/Out Cath Urine  Result Value Ref Range Status   Specimen Description   Final    IN/OUT CATH URINE Performed at [facility], [address] [place], [place] [address]    Special Requests   Final    NONE Performed at [facility], [address] [place], [place] [address]    Culture (A)  Final    >=100,000 COLONIES/mL LACTOBACILLUS SPECIES Standardized susceptibility testing for this organism is not available. Performed at [facility], [place] [address] [place], [place] [address]    Report Status 10/18/2020 FINAL  Final         Radiology Studies: No results found.      Scheduled Meds:  apixaban  5 mg Oral BID   divalproex  1,000 mg Oral Daily   escitalopram  20 mg Oral Daily   feeding supplement (GLUCERNA SHAKE)  237 mL Oral TID BM   insulin aspart  0-15 Units Subcutaneous TID WC   insulin aspart  0-5 Units Subcutaneous QHS   insulin detemir  14 Units Subcutaneous BID   multivitamin with minerals  1 tablet Oral Daily   polyethylene glycol  17 g Oral Daily   ramipril  5 mg Oral Daily   senna-docusate  1 tablet Oral QHS   thiamine  100 mg Oral Daily   vitamin B-12  1,000  mcg Oral Daily   Continuous Infusions:   LOS: 25 days    Time spent: 23 minutes    Sharen Hones, MD [facility]   To contact the attending provider between 7A-7P or the covering provider during after hours 7P-7A, please log into the web site [email or web address] and access using universal  password for that web site. If you do not have the password, please call the hospital operator.  10/26/2020, 1:34 PM

## 2020-10-27 DIAGNOSIS — G9341 Metabolic encephalopathy: Secondary | ICD-10-CM | POA: Diagnosis not present

## 2020-10-27 DIAGNOSIS — I10 Essential (primary) hypertension: Secondary | ICD-10-CM | POA: Diagnosis not present

## 2020-10-27 DIAGNOSIS — D329 Benign neoplasm of meninges, unspecified: Secondary | ICD-10-CM | POA: Diagnosis not present

## 2020-10-27 LAB — GLUCOSE, CAPILLARY
Glucose-Capillary: 160 mg/dL — ABNORMAL HIGH (ref 70–99)
Glucose-Capillary: 144 mg/dL — ABNORMAL HIGH (ref 70–99)
Glucose-Capillary: 241 mg/dL — ABNORMAL HIGH (ref 70–99)
Glucose-Capillary: 245 mg/dL — ABNORMAL HIGH (ref 70–99)

## 2020-10-27 MED ORDER — INSULIN DETEMIR 100 UNIT/ML ~~LOC~~ SOLN
16.0000 [IU] | Freq: Two times a day (BID) | SUBCUTANEOUS | Status: DC
  Administered 2020-10-27 – 2020-11-06 (×22): 16 [IU] via SUBCUTANEOUS
  Filled 2020-10-27 (×24): qty 0.16

## 2020-10-27 NOTE — Progress Notes (Signed)
PROGRESS NOTE    Daisy Watkins  [id number] DOB: 1933-03-18 DOA: 09/30/2020 PCP: Valerie Roys, DO    Brief Narrative:   Daisy Watkins is a 87 y.o. female with PMH significant for chronic dementia with behavioral disturbances, Meningioma (09/14/20 MRI), HTN, DM2, CAD, Afib/DVT (on Eliquis). Patient presented to the ED on 7/3 with acute confusion, agitation with intermittent crying, and difficulty taking care of herself and taking home medications.   -remains medically stable awaiting patient placement  Assessment & Plan:   Principal Problem:   Acute metabolic encephalopathy Active Problems:   Type 2 diabetes mellitus with hyperglycemia (HCC)   Coronary artery disease   Benign essential hypertension   Paroxysmal A-fib (HCC)   Anxiety, generalized   History of DVT (deep vein thrombosis)   Psychosis in elderly with behavioral disturbance (HCC)   AMS (altered mental status)   Meningioma (HCC)   Hypokalemia  #1.  Dementia with behavioral disturbance. Left anterior lobe meningioma. Conditions are stable.  2.  Controlled type 2 diabetes with hyperglycemia. Increase Levemir dose.    DVT prophylaxis: Eliquis Code Status: full Family Communication:  Disposition Plan:    Status is: Inpatient  Remains inpatient appropriate because:Unsafe d/c plan  Dispo: The patient is from: Home              Anticipated d/c is to: ALF              Patient currently is medically stable to d/c.   Difficult to place patient Yes        I/O last 3 completed shifts: In: 91 [P.O.:477] Out: -  No intake/output data recorded.     Consultants:  None  Procedures: None  Antimicrobials: None   Subjective: Patient doing well, no confusion.  She slept well last night. Denies any short of breath or cough. No abdominal pain or nausea vomiting. No dysuria hematuria. No fever or chills.  Objective: Vitals:   10/26/20 2100 10/27/20 0619 10/27/20 0815 10/27/20 1233  BP: (!)  133/43 135/68 (!) 146/47 (!) 135/50  Pulse: (!) 55 64 66 61  Resp: '17 17 16 16  '$ Temp: 98.7 F (37.1 C) 98.1 F (36.7 C) (!) 97.5 F (36.4 C) 98.1 F (36.7 C)  TempSrc:      SpO2: 97% 98% 100% 98%  Weight:      Height:        Intake/Output Summary (Last 24 hours) at 10/27/2020 1417 Last data filed at 10/26/2020 2300 Gross per 24 hour  Intake 237 ml  Output --  Net 237 ml   Filed Weights   09/30/20 1031 10/11/20 0601 10/23/20 1409  Weight: 74.5 kg 75.1 kg 76.1 kg    Examination:  General exam: Appears calm and comfortable  Respiratory system: Clear to auscultation. Respiratory effort normal. Cardiovascular system: S1 & S2 heard, RRR. No JVD, murmurs, rubs, gallops or clicks. No pedal edema. Gastrointestinal system: Abdomen is nondistended, soft and nontender. No organomegaly or masses felt. Normal bowel sounds heard. Central nervous system: Alert and oriented x2. No focal neurological deficits. Extremities: Symmetric 5 x 5 power. Skin: No rashes, lesions or ulcers Psychiatry: Mood & affect appropriate.     Data Reviewed: I have personally reviewed following labs and imaging studies  CBC: Recent Labs  Lab 10/25/20 0519  WBC 5.7  HGB 12.1  HCT 37.1  MCV 90.5  PLT [id number]   Basic Metabolic Panel: No results for input(s): NA, K, CL, CO2, GLUCOSE, BUN, CREATININE, CALCIUM, MG,  PHOS in the last 168 hours. GFR: Estimated Creatinine Clearance: 52.6 mL/min (by C-G formula based on SCr of 0.59 mg/dL). Liver Function Tests: No results for input(s): AST, ALT, ALKPHOS, BILITOT, PROT, ALBUMIN in the last 168 hours. No results for input(s): LIPASE, AMYLASE in the last 168 hours. No results for input(s): AMMONIA in the last 168 hours. Coagulation Profile: No results for input(s): INR, PROTIME in the last 168 hours. Cardiac Enzymes: No results for input(s): CKTOTAL, CKMB, CKMBINDEX, TROPONINI in the last 168 hours. BNP (last 3 results) No results for input(s): PROBNP in the  last 8760 hours. HbA1C: No results for input(s): HGBA1C in the last 72 hours. CBG: Recent Labs  Lab 10/26/20 1207 10/26/20 1603 10/26/20 2011 10/27/20 0816 10/27/20 1235  GLUCAP 200* 189* 228* 160* 144*   Lipid Profile: No results for input(s): CHOL, HDL, LDLCALC, TRIG, CHOLHDL, LDLDIRECT in the last 72 hours. Thyroid Function Tests: No results for input(s): TSH, T4TOTAL, FREET4, T3FREE, THYROIDAB in the last 72 hours. Anemia Panel: No results for input(s): VITAMINB12, FOLATE, FERRITIN, TIBC, IRON, RETICCTPCT in the last 72 hours. Sepsis Labs: No results for input(s): PROCALCITON, LATICACIDVEN in the last 168 hours.  No results found for this or any previous visit (from the past 240 hour(s)).       Radiology Studies: No results found.      Scheduled Meds:  apixaban  5 mg Oral BID   divalproex  1,000 mg Oral Daily   escitalopram  20 mg Oral Daily   feeding supplement (GLUCERNA SHAKE)  237 mL Oral TID BM   insulin aspart  0-15 Units Subcutaneous TID WC   insulin aspart  0-5 Units Subcutaneous QHS   insulin detemir  16 Units Subcutaneous BID   multivitamin with minerals  1 tablet Oral Daily   polyethylene glycol  17 g Oral Daily   ramipril  5 mg Oral Daily   senna-docusate  1 tablet Oral QHS   thiamine  100 mg Oral Daily   vitamin B-12  1,000 mcg Oral Daily   Continuous Infusions:   LOS: 26 days    Time spent: 21 minutes    Sharen Hones, MD [facility]   To contact the attending provider between 7A-7P or the covering provider during after hours 7P-7A, please log into the web site [email or web address] and access using universal [place] password for that web site. If you do not have the password, please call the hospital operator.  10/27/2020, 2:17 PM

## 2020-10-28 DIAGNOSIS — I10 Essential (primary) hypertension: Secondary | ICD-10-CM | POA: Diagnosis not present

## 2020-10-28 DIAGNOSIS — D329 Benign neoplasm of meninges, unspecified: Secondary | ICD-10-CM | POA: Diagnosis not present

## 2020-10-28 DIAGNOSIS — G9341 Metabolic encephalopathy: Secondary | ICD-10-CM | POA: Diagnosis not present

## 2020-10-28 LAB — GLUCOSE, CAPILLARY
Glucose-Capillary: 244 mg/dL — ABNORMAL HIGH (ref 70–99)
Glucose-Capillary: 207 mg/dL — ABNORMAL HIGH (ref 70–99)
Glucose-Capillary: 222 mg/dL — ABNORMAL HIGH (ref 70–99)
Glucose-Capillary: 225 mg/dL — ABNORMAL HIGH (ref 70–99)

## 2020-10-28 MED ORDER — INSULIN ASPART 100 UNIT/ML IJ SOLN
4.0000 [IU] | Freq: Three times a day (TID) | INTRAMUSCULAR | Status: DC
  Administered 2020-10-29 – 2020-11-06 (×10): 4 [IU] via SUBCUTANEOUS
  Filled 2020-10-28 (×10): qty 1

## 2020-10-28 MED ORDER — KETOTIFEN FUMARATE 0.025 % OP SOLN
2.0000 [drp] | Freq: Three times a day (TID) | OPHTHALMIC | Status: DC
  Filled 2020-10-28: qty 5

## 2020-10-28 MED ORDER — KETOTIFEN FUMARATE 0.025 % OP SOLN
1.0000 [drp] | Freq: Three times a day (TID) | OPHTHALMIC | Status: DC
  Administered 2020-10-28 – 2020-11-20 (×63): 1 [drp] via OPHTHALMIC
  Filled 2020-10-28 (×2): qty 5

## 2020-10-28 MED ORDER — ALUM & MAG HYDROXIDE-SIMETH 200-200-20 MG/5ML PO SUSP
30.0000 mL | Freq: Four times a day (QID) | ORAL | Status: DC | PRN
  Administered 2020-11-01: 09:00:00 30 mL via ORAL
  Filled 2020-10-28: qty 30

## 2020-10-28 NOTE — Progress Notes (Signed)
PROGRESS NOTE    Daisy Watkins  [id number] DOB: 1932-04-23 DOA: 09/30/2020 PCP: Valerie Roys, DO    Brief Narrative:  Daisy Watkins is a 87 y.o. female with PMH significant for chronic dementia with behavioral disturbances, Meningioma (09/14/20 MRI), HTN, DM2, CAD, Afib/DVT (on Eliquis). Patient presented to the ED on 7/3 with acute confusion, agitation with intermittent crying, and difficulty taking care of herself and taking home medications.   -remains medically stable awaiting patient placement   Assessment & Plan:   Principal Problem:   Acute metabolic encephalopathy Active Problems:   Type 2 diabetes mellitus with hyperglycemia (HCC)   Coronary artery disease   Benign essential hypertension   Paroxysmal A-fib (HCC)   Anxiety, generalized   History of DVT (deep vein thrombosis)   Psychosis in elderly with behavioral disturbance (HCC)   AMS (altered mental status)   Meningioma (HCC)   Hypokalemia  Patient glucose running high, add scheduled NovoLog 4 units 3 times a day. Otherwise patient is a stable pending placement.    DVT prophylaxis: Eliquis Code Status: full Family Communication:  Disposition Plan:      Status is: Inpatient   Remains inpatient appropriate because:Unsafe d/c plan   Dispo: The patient is from: Home              Anticipated d/c is to: ALF              Patient currently is medically stable to d/c.              Difficult to place patient Yes       I/O last 3 completed shifts: In: 237 [P.O.:237] Out: -  No intake/output data recorded.     Subjective: Patient doing well, she no longer has significant confusion.  No agitation. She has good appetite without nausea vomiting. No short of breath or cough. No dysuria or hematuria. No fever or chills.  Objective: Vitals:   10/27/20 1233 10/27/20 2006 10/28/20 0522 10/28/20 0719  BP: (!) 135/50 (!) 142/52 (!) 147/56 (!) 139/58  Pulse: 61 62 72 (!) 59  Resp: '16 17 17 16  '$ Temp:  98.1 F (36.7 C) 98.4 F (36.9 C) 98.4 F (36.9 C) 98.2 F (36.8 C)  TempSrc:  Oral  Oral  SpO2: 98% 99% 95% 97%  Weight:      Height:       No intake or output data in the 24 hours ending 10/28/20 1055 Filed Weights   09/30/20 1031 10/11/20 0601 10/23/20 1409  Weight: 74.5 kg 75.1 kg 76.1 kg    Examination:  General exam: Appears calm and comfortable  Respiratory system: Clear to auscultation. Respiratory effort normal. Cardiovascular system: S1 & S2 heard, RRR. No JVD, murmurs, rubs, gallops or clicks. No pedal edema. Gastrointestinal system: Abdomen is nondistended, soft and nontender. No organomegaly or masses felt. Normal bowel sounds heard. Central nervous system: Alert and oriented x2. No focal neurological deficits. Extremities: Symmetric 5 x 5 power. Skin: No rashes, lesions or ulcers Psychiatry: Judgement and insight appear normal. Mood & affect appropriate.     Data Reviewed: I have personally reviewed following labs and imaging studies  CBC: Recent Labs  Lab 10/25/20 0519  WBC 5.7  HGB 12.1  HCT 37.1  MCV 90.5  PLT [id number]   Basic Metabolic Panel: No results for input(s): NA, K, CL, CO2, GLUCOSE, BUN, CREATININE, CALCIUM, MG, PHOS in the last 168 hours. GFR: Estimated Creatinine Clearance: 52.6 mL/min (by C-G formula  based on SCr of 0.59 mg/dL). Liver Function Tests: No results for input(s): AST, ALT, ALKPHOS, BILITOT, PROT, ALBUMIN in the last 168 hours. No results for input(s): LIPASE, AMYLASE in the last 168 hours. No results for input(s): AMMONIA in the last 168 hours. Coagulation Profile: No results for input(s): INR, PROTIME in the last 168 hours. Cardiac Enzymes: No results for input(s): CKTOTAL, CKMB, CKMBINDEX, TROPONINI in the last 168 hours. BNP (last 3 results) No results for input(s): PROBNP in the last 8760 hours. HbA1C: No results for input(s): HGBA1C in the last 72 hours. CBG: Recent Labs  Lab 10/27/20 0816 10/27/20 1235  10/27/20 1745 10/27/20 2141 10/28/20 0720  GLUCAP 160* 144* 241* 245* 244*   Lipid Profile: No results for input(s): CHOL, HDL, LDLCALC, TRIG, CHOLHDL, LDLDIRECT in the last 72 hours. Thyroid Function Tests: No results for input(s): TSH, T4TOTAL, FREET4, T3FREE, THYROIDAB in the last 72 hours. Anemia Panel: No results for input(s): VITAMINB12, FOLATE, FERRITIN, TIBC, IRON, RETICCTPCT in the last 72 hours. Sepsis Labs: No results for input(s): PROCALCITON, LATICACIDVEN in the last 168 hours.  No results found for this or any previous visit (from the past 240 hour(s)).       Radiology Studies: No results found.      Scheduled Meds:  apixaban  5 mg Oral BID   divalproex  1,000 mg Oral Daily   escitalopram  20 mg Oral Daily   feeding supplement (GLUCERNA SHAKE)  237 mL Oral TID BM   insulin aspart  0-15 Units Subcutaneous TID WC   insulin aspart  0-5 Units Subcutaneous QHS   insulin detemir  16 Units Subcutaneous BID   multivitamin with minerals  1 tablet Oral Daily   polyethylene glycol  17 g Oral Daily   ramipril  5 mg Oral Daily   senna-docusate  1 tablet Oral QHS   thiamine  100 mg Oral Daily   vitamin B-12  1,000 mcg Oral Daily   Continuous Infusions:   LOS: 27 days    Time spent: 19 minutes    Sharen Hones, MD [facility]   To contact the attending provider between 7A-7P or the covering provider during after hours 7P-7A, please log into the web site [email or web address] and access using universal [place] password for that web site. If you do not have the password, please call the hospital operator.  10/28/2020, 10:55 AM

## 2020-10-29 DIAGNOSIS — G9341 Metabolic encephalopathy: Secondary | ICD-10-CM | POA: Diagnosis not present

## 2020-10-29 DIAGNOSIS — D329 Benign neoplasm of meninges, unspecified: Secondary | ICD-10-CM | POA: Diagnosis not present

## 2020-10-29 LAB — GLUCOSE, CAPILLARY
Glucose-Capillary: 177 mg/dL — ABNORMAL HIGH (ref 70–99)
Glucose-Capillary: 197 mg/dL — ABNORMAL HIGH (ref 70–99)
Glucose-Capillary: 204 mg/dL — ABNORMAL HIGH (ref 70–99)
Glucose-Capillary: 227 mg/dL — ABNORMAL HIGH (ref 70–99)
Glucose-Capillary: 234 mg/dL — ABNORMAL HIGH (ref 70–99)
Glucose-Capillary: 236 mg/dL — ABNORMAL HIGH (ref 70–99)

## 2020-10-29 LAB — CBC
HCT: 37.3 % (ref 36.0–46.0)
Hemoglobin: 11.9 g/dL — ABNORMAL LOW (ref 12.0–15.0)
MCH: 29 pg (ref 26.0–34.0)
MCHC: 31.9 g/dL (ref 30.0–36.0)
MCV: 90.8 fL (ref 80.0–100.0)
Platelets: 177 10*3/uL (ref 150–400)
RBC: 4.11 MIL/uL (ref 3.87–5.11)
RDW: 13.6 % (ref 11.5–15.5)
WBC: 5.3 10*3/uL (ref 4.0–10.5)
nRBC: 0 % (ref 0.0–0.2)

## 2020-10-29 NOTE — Progress Notes (Signed)
PROGRESS NOTE    [name]  [id number] DOB: 1932/04/05 DOA: 09/30/2020 PCP: Valerie Roys, DO    Brief Narrative:  Daisy Watkins is a 87 y.o. female with PMH significant for chronic dementia with behavioral disturbances, Meningioma (09/14/20 MRI), HTN, DM2, CAD, Afib/DVT (on Eliquis). Patient presented to the ED on 7/3 with acute confusion, agitation with intermittent crying, and difficulty taking care of herself and taking home medications.   -remains medically stable awaiting patient placement   Assessment & Plan:   Principal Problem:   Acute metabolic encephalopathy Active Problems:   Type 2 diabetes mellitus with hyperglycemia (HCC)   Coronary artery disease   Benign essential hypertension   Paroxysmal A-fib (HCC)   Anxiety, generalized   History of DVT (deep vein thrombosis)   Psychosis in elderly with behavioral disturbance (HCC)   AMS (altered mental status)   Meningioma (HCC)   Hypokalemia  Patient condition is stable, she is pending for placement.   DVT prophylaxis: Eliquis Code Status: full Family Communication:  Disposition Plan:      Status is: Inpatient   Remains inpatient appropriate because:Unsafe d/c plan   Dispo: The patient is from: Home              Anticipated d/c is to: ALF              Patient currently is medically stable to d/c.              Difficult to place patient Yes        I/O last 3 completed shifts: In: 1560 [P.O.:1560] Out: 500 [Urine:500] No intake/output data recorded.     Subjective: Patient is doing well, she is less confused, she has no agitation.  She slept well in the nighttime. She has good appetite, no nausea vomiting. No fever or chills. No shortness of breath or cough.  Objective: Vitals:   10/28/20 2028 10/29/20 0620 10/29/20 0724 10/29/20 1121  BP: (!) 141/62 (!) 132/54 (!) 121/50 123/61  Pulse: (!) 59 72 63 71  Resp: '16 16 16 18  '$ Temp: 99.4 F (37.4 C) 98.6 F (37 C) 98.7 F (37.1 C) 98 F  (36.7 C)  TempSrc: Oral     SpO2: 97% 96% 96% 95%  Weight:      Height:        Intake/Output Summary (Last 24 hours) at 10/29/2020 1416 Last data filed at 10/29/2020 0600 Gross per 24 hour  Intake 1560 ml  Output 500 ml  Net 1060 ml   Filed Weights   09/30/20 1031 10/11/20 0601 10/23/20 1409  Weight: 74.5 kg 75.1 kg 76.1 kg    Examination:  General exam: Appears calm and comfortable  Respiratory system: Clear to auscultation. Respiratory effort normal. Cardiovascular system: S1 & S2 heard, RRR. No JVD, murmurs, rubs, gallops or clicks. No pedal edema. Gastrointestinal system: Abdomen is nondistended, soft and nontender. No organomegaly or masses felt. Normal bowel sounds heard. Central nervous system: Alert and oriented x2. No focal neurological deficits. Extremities: Symmetric 5 x 5 power. Skin: No rashes, lesions or ulcers Psychiatry: Judgement and insight appear normal. Mood & affect appropriate.     Data Reviewed: I have personally reviewed following labs and imaging studies  CBC: Recent Labs  Lab 10/25/20 0519 10/29/20 0455  WBC 5.7 5.3  HGB 12.1 11.9*  HCT 37.1 37.3  MCV 90.5 90.8  PLT 156 [id number]   Basic Metabolic Panel: No results for input(s): NA, K, CL, CO2, GLUCOSE,  BUN, CREATININE, CALCIUM, MG, PHOS in the last 168 hours. GFR: Estimated Creatinine Clearance: 52.6 mL/min (by C-G formula based on SCr of 0.59 mg/dL). Liver Function Tests: No results for input(s): AST, ALT, ALKPHOS, BILITOT, PROT, ALBUMIN in the last 168 hours. No results for input(s): LIPASE, AMYLASE in the last 168 hours. No results for input(s): AMMONIA in the last 168 hours. Coagulation Profile: No results for input(s): INR, PROTIME in the last 168 hours. Cardiac Enzymes: No results for input(s): CKTOTAL, CKMB, CKMBINDEX, TROPONINI in the last 168 hours. BNP (last 3 results) No results for input(s): PROBNP in the last 8760 hours. HbA1C: No results for input(s): HGBA1C in the last 72  hours. CBG: Recent Labs  Lab 10/28/20 2027 10/29/20 0018 10/29/20 0627 10/29/20 0725 10/29/20 1121  GLUCAP 225* 177* 227* 197* 236*   Lipid Profile: No results for input(s): CHOL, HDL, LDLCALC, TRIG, CHOLHDL, LDLDIRECT in the last 72 hours. Thyroid Function Tests: No results for input(s): TSH, T4TOTAL, FREET4, T3FREE, THYROIDAB in the last 72 hours. Anemia Panel: No results for input(s): VITAMINB12, FOLATE, FERRITIN, TIBC, IRON, RETICCTPCT in the last 72 hours. Sepsis Labs: No results for input(s): PROCALCITON, LATICACIDVEN in the last 168 hours.  No results found for this or any previous visit (from the past 240 hour(s)).       Radiology Studies: No results found.      Scheduled Meds:  apixaban  5 mg Oral BID   divalproex  1,000 mg Oral Daily   escitalopram  20 mg Oral Daily   feeding supplement (GLUCERNA SHAKE)  237 mL Oral TID BM   insulin aspart  0-15 Units Subcutaneous TID WC   insulin aspart  0-5 Units Subcutaneous QHS   insulin aspart  4 Units Subcutaneous TID WC   insulin detemir  16 Units Subcutaneous BID   ketotifen  1 drop Both Eyes TID   multivitamin with minerals  1 tablet Oral Daily   polyethylene glycol  17 g Oral Daily   ramipril  5 mg Oral Daily   senna-docusate  1 tablet Oral QHS   thiamine  100 mg Oral Daily   vitamin B-12  1,000 mcg Oral Daily   Continuous Infusions:   LOS: 28 days    Time spent: 19 minutes    Sharen Hones, MD [facility]   To contact the attending provider between 7A-7P or the covering provider during after hours 7P-7A, please log into the web site [email or web address] and access using universal [place] password for that web site. If you do not have the password, please call the hospital operator.  10/29/2020, 2:16 PM

## 2020-10-29 NOTE — Progress Notes (Signed)
+  2 BLE edema noted, per NT her legs appear more swollen than on Saturday. MD was made aware, and will continue to monitor

## 2020-10-29 NOTE — Progress Notes (Signed)
Physical Therapy Treatment Patient Details Name: Daisy Watkins MRN: [id number] DOB: 1932/11/20 Today's Date: 10/29/2020    History of Present Illness 87 y/o admitted on 09/30/20 with c/c of AMS with confusion and agitation. Pt recently d/c on 09/15/20 after being admitted for change in mental status. PMH: anxiety, DVT, DM, GERD, insomnia, HTN, HLD, insomnia, myalgia.    PT Comments    Pt received supine in bed sleeping, easily aroused. She declined out of bed mobility but was agreeable to therex in bed. Pt pleasant today; she asked PT to change music station to a genre she enjoys. Pt demo decreased DF in bilateral ankles - PT provided a stretch and educated pt on shortening of PF due to extensive time spent in bed and challenges that could result during standing and ambulation. Pt demo increased SOB and exertion with manually resisted exercises. She demo good strength however decreased functional endurance. Would benefit from skilled PT to address above deficits and promote optimal return to PLOF.   Follow Up Recommendations  Supervision/Assistance - 24 hour;Supervision for mobility/OOB     Equipment Recommendations  None recommended by PT    Recommendations for Other Services       Precautions / Restrictions Precautions Precautions: Fall Restrictions Weight Bearing Restrictions: No    Mobility  Bed Mobility                    Transfers                    Ambulation/Gait                 Stairs             Wheelchair Mobility    Modified Rankin (Stroke Patients Only)       Balance                                            Cognition Arousal/Alertness: Awake/alert Behavior During Therapy: WFL for tasks assessed/performed Overall Cognitive Status: History of cognitive impairments - at baseline Area of Impairment: Safety/judgement;Attention;Problem solving                 Orientation Level: Situation Current  Attention Level: Selective Memory: Decreased short-term memory Following Commands: Follows one step commands inconsistently;Follows one step commands with increased time Safety/Judgement: Decreased awareness of safety Awareness: Anticipatory;Intellectual Problem Solving: Slow processing;Requires verbal cues;Requires tactile cues;Decreased initiation        Exercises General Exercises - Lower Extremity Ankle Circles/Pumps: AROM;20 reps;Both;Supine Short Arc Quad: Both;10 reps;Supine;AROM Heel Slides: Strengthening;10 reps;Both;Supine Hip ABduction/ADduction: AROM;Strengthening;Both;10 reps (manually resisted) Straight Leg Raises: 10 reps;Strengthening;Both;Supine Other Exercises Other Exercises: Bilateral DF stretch, 2 x 30 seconds with education on shortening of plantar flexors due to extensive time spent in bed    General Comments General comments (skin integrity, edema, etc.): Balance not assessed as pt remained supine in bed for therex      Pertinent Vitals/Pain Pain Assessment: No/denies pain    Home Living                      Prior Function            PT Goals (current goals can now be found in the care plan section) Acute Rehab PT Goals Patient Stated Goal: none stated    Frequency    Min  2X/week      PT Plan      Co-evaluation              AM-PAC PT "6 Clicks" Mobility   Outcome Measure  Help needed turning from your back to your side while in a flat bed without using bedrails?: None Help needed moving from lying on your back to sitting on the side of a flat bed without using bedrails?: A Little Help needed moving to and from a bed to a chair (including a wheelchair)?: A Little Help needed standing up from a chair using your arms (e.g., wheelchair or bedside chair)?: A Little Help needed to walk in hospital room?: A Little Help needed climbing 3-5 steps with a railing? : A Lot 6 Click Score: 18    End of Session   Activity Tolerance:  Patient tolerated treatment well Patient left: in bed;with call bell/phone within reach;with bed alarm set   PT Visit Diagnosis: Unsteadiness on feet (R26.81)     Time: [id number] PT Time Calculation (min) (ACUTE ONLY): 14 min  Charges:  $Therapeutic Exercise: 8-22 mins                    Daisy Watkins PT, DPT 10/29/20 11:07 AM [id number]    Daisy Watkins 10/29/2020, 11:07 AM

## 2020-10-29 NOTE — Progress Notes (Addendum)
Patient is being discharged to home. AVS reviewed, all questions answered. Patient confirmed she has all belongings and her prescriptions were sent to the correct pharmacy. IV was removed and telemetry discontinued. Walker was delivered to the room. Son was called for transportation.   16:08 Patient's son called for transportation. He anticipated arriving to pick her up at 17:00  36: 71 Patient's son was called, he anticipates picking her up at 18:00.

## 2020-10-30 DIAGNOSIS — G9341 Metabolic encephalopathy: Secondary | ICD-10-CM | POA: Diagnosis not present

## 2020-10-30 DIAGNOSIS — I2510 Atherosclerotic heart disease of native coronary artery without angina pectoris: Secondary | ICD-10-CM | POA: Diagnosis not present

## 2020-10-30 LAB — GLUCOSE, CAPILLARY
Glucose-Capillary: 126 mg/dL — ABNORMAL HIGH (ref 70–99)
Glucose-Capillary: 117 mg/dL — ABNORMAL HIGH (ref 70–99)
Glucose-Capillary: 175 mg/dL — ABNORMAL HIGH (ref 70–99)
Glucose-Capillary: 174 mg/dL — ABNORMAL HIGH (ref 70–99)

## 2020-10-30 NOTE — Progress Notes (Signed)
PROGRESS NOTE    Daisy Watkins  [id number] DOB: 01-05-1933 DOA: 09/30/2020 PCP: Valerie Roys, DO    Brief Narrative:  Daisy Watkins is a 87 y.o. female with PMH significant for chronic dementia with behavioral disturbances, Meningioma (09/14/20 MRI), HTN, DM2, CAD, Afib/DVT (on Eliquis). Patient presented to the ED on 7/3 with acute confusion, agitation with intermittent crying, and difficulty taking care of herself and taking home medications.   -remains medically stable awaiting patient placement   Assessment & Plan:   Principal Problem:   Acute metabolic encephalopathy Active Problems:   Type 2 diabetes mellitus with hyperglycemia (HCC)   Coronary artery disease   Benign essential hypertension   Paroxysmal A-fib (HCC)   Anxiety, generalized   History of DVT (deep vein thrombosis)   Psychosis in elderly with behavioral disturbance (HCC)   AMS (altered mental status)   Meningioma (HCC)   Hypokalemia  Patient has been stable for the last 2 weeks, currently pending for placement.  No safe option for discharge.    DVT prophylaxis: Eliquis Code Status: full Family Communication:  Disposition Plan:      Status is: Inpatient   Remains inpatient appropriate because:Unsafe d/c plan   Dispo: The patient is from: Home              Anticipated d/c is to: ALF              Patient currently is medically stable to d/c.              Difficult to place patient Yes       I/O last 3 completed shifts: In: 58 [P.O.:1560] Out: 500 [Urine:500] Total I/O In: 240 [P.O.:240] Out: -      Consultants:  None  Procedures: None  Antimicrobials: None   Subjective: Patient is doing well, pending placement.  Currently she does not have complaints.  She slept well last night, she has good appetite.  He does not have any fever or chills. No short of breath or cough.  Objective: Vitals:   10/29/20 2352 10/30/20 0408 10/30/20 0724 10/30/20 1110  BP: (!) 141/57 (!) 128/51  (!) 139/59 (!) 128/58  Pulse: 79 65 68 69  Resp: '16 18 16 18  '$ Temp: 98.6 F (37 C) 98.4 F (36.9 C) 98.2 F (36.8 C) 98.2 F (36.8 C)  TempSrc: Oral Oral Oral Oral  SpO2: 95% 96% 95% 95%  Weight:      Height:        Intake/Output Summary (Last 24 hours) at 10/30/2020 1301 Last data filed at 10/30/2020 1000 Gross per 24 hour  Intake 240 ml  Output --  Net 240 ml   Filed Weights   09/30/20 1031 10/11/20 0601 10/23/20 1409  Weight: 74.5 kg 75.1 kg 76.1 kg    Examination:  General exam: Appears calm and comfortable  Respiratory system: Clear to auscultation. Respiratory effort normal. Cardiovascular system: S1 & S2 heard, RRR. No JVD, murmurs, rubs, gallops or clicks. No pedal edema. Gastrointestinal system: Abdomen is nondistended, soft and nontender. No organomegaly or masses felt. Normal bowel sounds heard. Central nervous system: Alert and oriented x2. No focal neurological deficits. Extremities: Symmetric 5 x 5 power. Skin: No rashes, lesions or ulcers Psychiatry: Mood & affect appropriate.     Data Reviewed: I have personally reviewed following labs and imaging studies  CBC: Recent Labs  Lab 10/25/20 0519 10/29/20 0455  WBC 5.7 5.3  HGB 12.1 11.9*  HCT 37.1 37.3  MCV 90.5 90.8  PLT 156 [id number]   Basic Metabolic Panel: No results for input(s): NA, K, CL, CO2, GLUCOSE, BUN, CREATININE, CALCIUM, MG, PHOS in the last 168 hours. GFR: Estimated Creatinine Clearance: 52.6 mL/min (by C-G formula based on SCr of 0.59 mg/dL). Liver Function Tests: No results for input(s): AST, ALT, ALKPHOS, BILITOT, PROT, ALBUMIN in the last 168 hours. No results for input(s): LIPASE, AMYLASE in the last 168 hours. No results for input(s): AMMONIA in the last 168 hours. Coagulation Profile: No results for input(s): INR, PROTIME in the last 168 hours. Cardiac Enzymes: No results for input(s): CKTOTAL, CKMB, CKMBINDEX, TROPONINI in the last 168 hours. BNP (last 3 results) No results for  input(s): PROBNP in the last 8760 hours. HbA1C: No results for input(s): HGBA1C in the last 72 hours. CBG: Recent Labs  Lab 10/29/20 1121 10/29/20 1609 10/29/20 2107 10/30/20 0728 10/30/20 1113  GLUCAP 236* 204* 234* 126* 117*   Lipid Profile: No results for input(s): CHOL, HDL, LDLCALC, TRIG, CHOLHDL, LDLDIRECT in the last 72 hours. Thyroid Function Tests: No results for input(s): TSH, T4TOTAL, FREET4, T3FREE, THYROIDAB in the last 72 hours. Anemia Panel: No results for input(s): VITAMINB12, FOLATE, FERRITIN, TIBC, IRON, RETICCTPCT in the last 72 hours. Sepsis Labs: No results for input(s): PROCALCITON, LATICACIDVEN in the last 168 hours.  No results found for this or any previous visit (from the past 240 hour(s)).       Radiology Studies: No results found.      Scheduled Meds:  apixaban  5 mg Oral BID   divalproex  1,000 mg Oral Daily   escitalopram  20 mg Oral Daily   feeding supplement (GLUCERNA SHAKE)  237 mL Oral TID BM   insulin aspart  0-15 Units Subcutaneous TID WC   insulin aspart  0-5 Units Subcutaneous QHS   insulin aspart  4 Units Subcutaneous TID WC   insulin detemir  16 Units Subcutaneous BID   ketotifen  1 drop Both Eyes TID   multivitamin with minerals  1 tablet Oral Daily   polyethylene glycol  17 g Oral Daily   ramipril  5 mg Oral Daily   senna-docusate  1 tablet Oral QHS   thiamine  100 mg Oral Daily   vitamin B-12  1,000 mcg Oral Daily   Continuous Infusions:   LOS: 29 days    Time spent: 21 minutes    Sharen Hones, MD [facility]   To contact the attending provider between 7A-7P or the covering provider during after hours 7P-7A, please log into the web site [email or web address] and access using universal [place] password for that web site. If you do not have the password, please call the hospital operator.  10/30/2020, 1:01 PM

## 2020-10-30 NOTE — Progress Notes (Signed)
Nutrition Follow-up  DOCUMENTATION CODES:  Not applicable  INTERVENTION:  Continue current diet as ordered  Glucerna Shake po TID, each supplement provides 220 kcal and 10 grams of protein Multivitamin with minerals daily Nursing to assist pt with meals  NUTRITION DIAGNOSIS:  Inadequate oral intake related to poor appetite as evidenced by per patient/family report, meal completion < 50%.  GOAL:  Patient will meet greater than or equal to 90% of their needs  MONITOR:  PO intake, Supplement acceptance, Weight trends  REASON FOR ASSESSMENT:  Malnutrition Screening Tool    ASSESSMENT:  87 y.o. female brought to ED from home with AMS. Family reports that pt was agitated earlier in the day and then became unresponsive. PMH relevant for DM, GERD, HTN, HLD, Vitamin B12 and D deficiencies, and atrial fibrillation.  Pt continues to have improved intake, weight is stable this admission. Will continue current nutrition interventions. Remains medically stable awaiting discharge.  Average Meal Intake: 7/3-7/5: 0% intake x 1 recorded meal 7/6-7/14: 15% intake x 23 recorded meals (0-100%) 7/15-7/20: 33% intake x 3 recorded meals (25-50%) 7/21-7/26: 50% intake x 2 recorded meals 7/27-8/2: 92% intake x 3 recorded meals (75-100%)  Nutritionally Relevant Medications: Scheduled Meds:  feeding supplement (GLUCERNA SHAKE)  237 mL Oral TID BM   insulin aspart  0-15 Units Subcutaneous TID WC   insulin aspart  0-5 Units Subcutaneous QHS   insulin aspart  4 Units Subcutaneous TID WC   insulin detemir  16 Units Subcutaneous BID   multivitamin with minerals  1 tablet Oral Daily   polyethylene glycol  17 g Oral Daily   senna-docusate  1 tablet Oral QHS   thiamine  100 mg Oral Daily   vitamin B-12  1,000 mcg Oral Daily   PRN Meds: alum & mag hydroxide-simeth, mineral oil, ondansetron  Labs Reviewed: SBG ranges from 126-236 mg/dL over the last 24 hours HgbA1c 10.2% (6/17)  NUTRITION - FOCUSED  PHYSICAL EXAM: Flowsheet Row Most Recent Value  Orbital Region No depletion  Upper Arm Region No depletion  Thoracic and Lumbar Region No depletion  Buccal Region No depletion  Temple Region No depletion  Clavicle Bone Region No depletion  Clavicle and Acromion Bone Region No depletion  Scapular Bone Region No depletion  Dorsal Hand No depletion  Patellar Region No depletion  Anterior Thigh Region No depletion  Posterior Calf Region No depletion  Edema (RD Assessment) Mild  Hair Reviewed  Eyes Reviewed  Mouth Reviewed  Skin Reviewed  Nails Reviewed   Diet Order:   Diet Order             Diet Carb Modified Fluid consistency: Thin; Room service appropriate? Yes  Diet effective now                  EDUCATION NEEDS:  No education needs have been identified at this time  Skin:  Skin Assessment: Reviewed RN Assessment  Last BM:  8/2   Height:  Ht Readings from Last 1 Encounters:  09/30/20 '5\' 6"'$  (1.676 m)   Weight:  Wt Readings from Last 1 Encounters:  10/23/20 76.1 kg   Ideal Body Weight:  59.1 kg  BMI:  Body mass index is 27.08 kg/m.  Estimated Nutritional Needs:  Kcal:  1600-1800 kcal/d Protein:  80-90 g/d Fluid:  1.8-2L/d   Ranell Patrick, RD, LDN Clinical Dietitian Pager on [place]

## 2020-10-30 NOTE — Progress Notes (Signed)
Physical Therapy Treatment Patient Details Name: Daisy Watkins MRN: [id number] DOB: [birth year] Today's Date: 10/30/2020    History of Present Illness 87 y/o admitted on 09/30/20 with c/c of AMS with confusion and agitation. Pt recently d/c on 09/15/20 after being admitted for change in mental status. PMH: anxiety, DVT, DM, GERD, insomnia, HTN, HLD, insomnia, myalgia.    PT Comments    Pt received supine in bed, NT just completing assessment of vitals. Pt agreeable to PT. Pt performed 2 therapeutic exercises before refusing further exercise. PT was able to convince pt to reposition towards [facility] - repositioning achieved with SUP and cueing. Pt then asked for a fresh drink prior to PT leaving room. All needs met, NT in room, bed alarm set. Would benefit from skilled PT to address above deficits and promote optimal return to PLOF.   Follow Up Recommendations  Supervision/Assistance - 24 hour;Supervision for mobility/OOB     Equipment Recommendations  Other (comment) (TBD at next venue of care.)    Recommendations for Other Services       Precautions / Restrictions Precautions Precautions: Fall Restrictions Weight Bearing Restrictions: No    Mobility  Bed Mobility                    Transfers                    Ambulation/Gait                 Stairs             Wheelchair Mobility    Modified Rankin (Stroke Patients Only)       Balance                                            Cognition Arousal/Alertness: Awake/alert Behavior During Therapy: WFL for tasks assessed/performed Overall Cognitive Status: History of cognitive impairments - at baseline Area of Impairment: Safety/judgement;Attention;Problem solving                 Orientation Level: Situation Current Attention Level: Selective Memory: Decreased short-term memory Following Commands: Follows one step commands inconsistently;Follows one step commands with  increased time Safety/Judgement: Decreased awareness of safety Awareness: Anticipatory;Intellectual Problem Solving: Slow processing;Requires verbal cues;Requires tactile cues        Exercises General Exercises - Lower Extremity Ankle Circles/Pumps: AROM;20 reps;Both;Supine Straight Leg Raises: 10 reps;Strengthening;Both;Supine Other Exercises Other Exercises: PT instructed pt through process of repositioning; it was performed with SUP, VC and TC. During repositioning in bed, pt performed 5 partial ROM bridges in order to scoot towards [place]. Pt also utilized BUE on bed rails.    General Comments        Pertinent Vitals/Pain Pain Assessment: No/denies pain    Home Living                      Prior Function            PT Goals (current goals can now be found in the care plan section) Acute Rehab PT Goals Patient Stated Goal: none stated    Frequency    Min 2X/week      PT Plan      Co-evaluation              AM-PAC PT "6 Clicks" Mobility   Outcome Measure  Help needed turning from your back to your side while in a flat bed without using bedrails?: None Help needed moving from lying on your back to sitting on the side of a flat bed without using bedrails?: A Little Help needed moving to and from a bed to a chair (including a wheelchair)?: A Little Help needed standing up from a chair using your arms (e.g., wheelchair or bedside chair)?: A Little Help needed to walk in hospital room?: A Little Help needed climbing 3-5 steps with a railing? : A Lot 6 Click Score: 18    End of Session   Activity Tolerance: Patient tolerated treatment well Patient left: in bed;with call bell/phone within reach;with bed alarm set;with nursing/sitter in room Nurse Communication: Mobility status PT Visit Diagnosis: Unsteadiness on feet (R26.81)     Time: 2707-8675 PT Time Calculation (min) (ACUTE ONLY): 11 min  Charges:  $Therapeutic Exercise: 8-22 mins                      Daisy Watkins PT, DPT 10/30/20 12:30 PM [phone]    Daisy Watkins 10/30/2020, 12:30 PM

## 2020-10-31 DIAGNOSIS — G9341 Metabolic encephalopathy: Secondary | ICD-10-CM | POA: Diagnosis not present

## 2020-10-31 LAB — GLUCOSE, CAPILLARY
Glucose-Capillary: 214 mg/dL — ABNORMAL HIGH (ref 70–99)
Glucose-Capillary: 130 mg/dL — ABNORMAL HIGH (ref 70–99)
Glucose-Capillary: 160 mg/dL — ABNORMAL HIGH (ref 70–99)
Glucose-Capillary: 192 mg/dL — ABNORMAL HIGH (ref 70–99)

## 2020-10-31 NOTE — Progress Notes (Signed)
PT Cancellation Note  Patient Details Name: Daisy Watkins MRN: [id number] DOB: 18-Jan-1933   Cancelled Treatment:    Reason Eval/Treat Not Completed: Other (comment). Pt received supine in bed, drinking an ensure. She refused PT this morning stating she wants breakfast (11am) first. In bed exercises as well as out of bed mobility were offered. Will re-attempt mobility this afternoon as time permits.   [name] PT, DPT 10/31/20 11:04 AM [id number]  [name] 10/31/2020, 11:02 AM

## 2020-10-31 NOTE — TOC Progression Note (Signed)
Transition of Care [facility]) - Progression Note    Patient Details  Name: Daisy Watkins MRN: [id number] Date of Birth: 02-Nov-1932  [facility] CM/SW Contact  Shelbie Hutching, RN Phone Number: 10/31/2020, 3:30 PM  Clinical Narrative:     Medicaid pending.  TOC continues to search for assisted living placement.  [facility] would not accept due to patient's behavior issues.    Expected Discharge Plan: Assisted Living Barriers to Discharge: Family Issues, Other (must enter comment) (Medicaid application pending-no ALF bed)  Expected Discharge Plan and Services Expected Discharge Plan: Assisted Living   Discharge Planning Services: CM Consult   Living arrangements for the past 2 months: Single Family Home Expected Discharge Date: 10/03/20               DME Arranged: N/A DME Agency: NA       HH Arranged: NA           Social Determinants of Health (SDOH) Interventions    Readmission Risk Interventions No flowsheet data found.

## 2020-10-31 NOTE — Progress Notes (Signed)
PROGRESS NOTE    Daisy Watkins  [id number] DOB: March 04, 1933 DOA: 09/30/2020 PCP: Valerie Roys, DO    Brief Narrative:  Daisy Watkins is a 87 y.o. female with PMH significant for chronic dementia with behavioral disturbances, Meningioma (09/14/20 MRI), HTN, DM2, CAD, Afib/DVT (on Eliquis). Patient presented to the ED on 7/3 with acute confusion, agitation with intermittent crying, and difficulty taking care of herself and taking home medications.   -remains medically stable awaiting patient placement   Assessment & Plan:   Principal Problem:   Acute metabolic encephalopathy Active Problems:   Type 2 diabetes mellitus with hyperglycemia (HCC)   Coronary artery disease   Benign essential hypertension   Paroxysmal A-fib (HCC)   Anxiety, generalized   History of DVT (deep vein thrombosis)   Psychosis in elderly with behavioral disturbance (HCC)   AMS (altered mental status)   Meningioma (HCC)   Hypokalemia  Patient has been stable for the last 2 weeks, currently pending for placement.  No safe option for discharge.  -Continue Eliquis, Depakote, escitalopram, insulin, ramipril  DVT prophylaxis: Eliquis Code Status: full Family Communication:  Disposition Plan:      Status is: Inpatient   Remains inpatient appropriate because:Unsafe d/c plan   Dispo: The patient is from: Home              Anticipated d/c is to: ALF              Patient currently is medically stable to d/c.              Difficult to place patient Yes       I/O last 3 completed shifts: In: 950 [P.O.:950] Out: -  No intake/output data recorded.     Consultants:  None  Procedures: None  Antimicrobials: None   Subjective: No new headache, chest pain, dyspnea, abdominal pain. Objective: Vitals:   10/31/20 0501 10/31/20 0744 10/31/20 1122 10/31/20 1644  BP: (!) 133/50 (!) 127/55 (!) 115/51 130/68  Pulse: 64 66 (!) 54 (!) 54  Resp: '18 16 16 18  '$ Temp: 98.3 F (36.8 C) 98.2 F (36.8 C)  97.7 F (36.5 C) 98.2 F (36.8 C)  TempSrc: Oral Oral Oral Oral  SpO2: 98% 96% 98% 99%  Weight:      Height:        Intake/Output Summary (Last 24 hours) at 10/31/2020 1748 Last data filed at 10/31/2020 0400 Gross per 24 hour  Intake 710 ml  Output --  Net 710 ml   Filed Weights   09/30/20 1031 10/11/20 0601 10/23/20 1409  Weight: 74.5 kg 75.1 kg 76.1 kg    Examination:  General exam: Adult female, lying in bed, no acute distress Respiratory system: Clear to auscultation. Respiratory effort normal. Cardiovascular system: S1 & S2 heard, RRR. No JVD, murmurs, rubs, gallops or clicks. No pedal edema. Gastrointestinal system: Abdomen is nondistended, soft and nontender. No organomegaly or masses felt. Normal bowel sounds heard. Central nervous system: Alert and oriented x2. No focal neurological deficits. Extremities: Symmetric 5 x 5 power. Skin: No rashes, lesions or ulcers Psychiatry: Mood & affect appropriate.     Data Reviewed: I have personally reviewed following labs and imaging studies  CBC: Recent Labs  Lab 10/25/20 0519 10/29/20 0455  WBC 5.7 5.3  HGB 12.1 11.9*  HCT 37.1 37.3  MCV 90.5 90.8  PLT 156 [id number]   Basic Metabolic Panel: No results for input(s): NA, K, CL, CO2, GLUCOSE, BUN, CREATININE, CALCIUM, MG, PHOS  in the last 168 hours. GFR: Estimated Creatinine Clearance: 52.6 mL/min (by C-G formula based on SCr of 0.59 mg/dL). Liver Function Tests: No results for input(s): AST, ALT, ALKPHOS, BILITOT, PROT, ALBUMIN in the last 168 hours. No results for input(s): LIPASE, AMYLASE in the last 168 hours. No results for input(s): AMMONIA in the last 168 hours. Coagulation Profile: No results for input(s): INR, PROTIME in the last 168 hours. Cardiac Enzymes: No results for input(s): CKTOTAL, CKMB, CKMBINDEX, TROPONINI in the last 168 hours. BNP (last 3 results) No results for input(s): PROBNP in the last 8760 hours. HbA1C: No results for input(s): HGBA1C in the  last 72 hours. CBG: Recent Labs  Lab 10/30/20 1553 10/30/20 2140 10/31/20 0743 10/31/20 1123 10/31/20 1610  GLUCAP 175* 174* 214* 130* 160*   Lipid Profile: No results for input(s): CHOL, HDL, LDLCALC, TRIG, CHOLHDL, LDLDIRECT in the last 72 hours. Thyroid Function Tests: No results for input(s): TSH, T4TOTAL, FREET4, T3FREE, THYROIDAB in the last 72 hours. Anemia Panel: No results for input(s): VITAMINB12, FOLATE, FERRITIN, TIBC, IRON, RETICCTPCT in the last 72 hours. Sepsis Labs: No results for input(s): PROCALCITON, LATICACIDVEN in the last 168 hours.  No results found for this or any previous visit (from the past 240 hour(s)).       Radiology Studies: No results found.      Scheduled Meds:  apixaban  5 mg Oral BID   divalproex  1,000 mg Oral Daily   escitalopram  20 mg Oral Daily   feeding supplement (GLUCERNA SHAKE)  237 mL Oral TID BM   insulin aspart  0-15 Units Subcutaneous TID WC   insulin aspart  0-5 Units Subcutaneous QHS   insulin aspart  4 Units Subcutaneous TID WC   insulin detemir  16 Units Subcutaneous BID   ketotifen  1 drop Both Eyes TID   multivitamin with minerals  1 tablet Oral Daily   polyethylene glycol  17 g Oral Daily   ramipril  5 mg Oral Daily   senna-docusate  1 tablet Oral QHS   thiamine  100 mg Oral Daily   vitamin B-12  1,000 mcg Oral Daily   Continuous Infusions:   LOS: 30 days    Time spent: 21 minutes    Edwin Dada, MD [facility]   To contact the attending provider between 7A-7P or the covering provider during after hours 7P-7A, please log into the web site [email or web address] and access using universal [place] password for that web site. If you do not have the password, please call the hospital operator.  10/31/2020, 5:48 PM

## 2020-11-01 DIAGNOSIS — G9341 Metabolic encephalopathy: Secondary | ICD-10-CM | POA: Diagnosis not present

## 2020-11-01 LAB — CBC
HCT: 36.1 % (ref 36.0–46.0)
Hemoglobin: 11.7 g/dL — ABNORMAL LOW (ref 12.0–15.0)
MCH: 29.4 pg (ref 26.0–34.0)
MCHC: 32.4 g/dL (ref 30.0–36.0)
MCV: 90.7 fL (ref 80.0–100.0)
Platelets: 154 10*3/uL (ref 150–400)
RBC: 3.98 MIL/uL (ref 3.87–5.11)
RDW: 13.9 % (ref 11.5–15.5)
WBC: 5.5 10*3/uL (ref 4.0–10.5)
nRBC: 0 % (ref 0.0–0.2)

## 2020-11-01 LAB — GLUCOSE, CAPILLARY
Glucose-Capillary: 91 mg/dL (ref 70–99)
Glucose-Capillary: 159 mg/dL — ABNORMAL HIGH (ref 70–99)
Glucose-Capillary: 116 mg/dL — ABNORMAL HIGH (ref 70–99)
Glucose-Capillary: 102 mg/dL — ABNORMAL HIGH (ref 70–99)

## 2020-11-01 NOTE — Progress Notes (Signed)
PT Cancellation Note  Patient Details Name: Daisy Watkins MRN: [id number] DOB: 09/15/1932   Cancelled Treatment:     PT attempt. Pt refused. " I'm not doing anything until I get my breakfast. Attempted to encourage and reorient pt to correct time of day however unsuccessful. Will continue to follow and progress as able per POC.    [name] 11/01/2020, 3:18 PM

## 2020-11-01 NOTE — Plan of Care (Signed)
Stable

## 2020-11-01 NOTE — Progress Notes (Signed)
PROGRESS NOTE    Daisy Watkins  [id number] DOB: May 08, 1932 DOA: 09/30/2020 PCP: Valerie Roys, DO    Brief Narrative:  Daisy Watkins is a 87 y.o. F with HTN, diabetes, meningioma, A. fib/history DVT on Eliquis, and CAD who presented with agitation.    See interim discharge summary from 10/03/20         Assessment & Plan:   Principal Problem:   Acute metabolic encephalopathy Active Problems:   Type 2 diabetes mellitus with hyperglycemia (HCC)   Coronary artery disease   Benign essential hypertension   Paroxysmal A-fib (HCC)   Anxiety, generalized   History of DVT (deep vein thrombosis)   Psychosis in elderly with behavioral disturbance (HCC)   AMS (altered mental status)   Meningioma (HCC)   Hypokalemia  Paroxysmal atrial fibrillation History of DVT - Continue Eliquis  Dementia behavioral disturbance - Continue Lexapro and Depakote  Diabetes With glucoses controlled - Continue sliding scale corrections, Levemir, mealtime insulin  Hypertension - Continue ramipril   DVT prophylaxis: Eliquis Code Status: full Family Communication:  Disposition Plan:      Status is: Inpatient   Remains inpatient appropriate because:Unsafe d/c plan   Dispo: The patient is from: Home              Anticipated d/c is to: ALF              Patient currently is medically stable to d/c.              Difficult to place patient Yes       I/O last 3 completed shifts: In: 710 [P.O.:710] Out: -  No intake/output data recorded.     Consultants:  None  Procedures: None  Antimicrobials: None   Subjective: No new headache, chest pain, abdominal pain, dyspnea, cough. Objective: Vitals:   11/01/20 0438 11/01/20 0713 11/01/20 1110 11/01/20 1604  BP: 116/60 (!) 123/56 (!) 130/52 (!) 129/57  Pulse: 62 67 (!) 56 62  Resp: '18 16 20 16  '$ Temp: 98.2 F (36.8 C) 98 F (36.7 C) 98.3 F (36.8 C) 97.7 F (36.5 C)  TempSrc:      SpO2: 98% 97% 96% 99%  Weight:      Height:        No intake or output data in the 24 hours ending 11/01/20 1858  Filed Weights   09/30/20 1031 10/11/20 0601 10/23/20 1409  Weight: 74.5 kg 75.1 kg 76.1 kg    Examination:  General appearance: Adult female, lying in bed, no acute distress     HEENT:    Skin:  Cardiac: RRR, no murmurs, no lower extremity edema, no JVD Respiratory: Normal respiratory rate and rhythm, lungs clear without rales or wheezes Abdomen: Abdomen soft without tenderness palpation or guarding, no ascites or distention MSK:  Neuro: Awake and responds to questions, strength 5 -/5 in bilateral upper extremities, speech fluent, face symmetric Psych: Attention normal, makes eye contact, affect somewhat blunted, but interactive, judgment and insight appear impaired     Data Reviewed: I have personally reviewed following labs and imaging studies  CBC: Recent Labs  Lab 10/29/20 0455 11/01/20 0429  WBC 5.3 5.5  HGB 11.9* 11.7*  HCT 37.3 36.1  MCV 90.8 90.7  PLT 177 [id number]   Basic Metabolic Panel: No results for input(s): NA, K, CL, CO2, GLUCOSE, BUN, CREATININE, CALCIUM, MG, PHOS in the last 168 hours. GFR: Estimated Creatinine Clearance: 52.6 mL/min (by C-G formula based on SCr of 0.59 mg/dL).  Liver Function Tests: No results for input(s): AST, ALT, ALKPHOS, BILITOT, PROT, ALBUMIN in the last 168 hours. No results for input(s): LIPASE, AMYLASE in the last 168 hours. No results for input(s): AMMONIA in the last 168 hours. Coagulation Profile: No results for input(s): INR, PROTIME in the last 168 hours. Cardiac Enzymes: No results for input(s): CKTOTAL, CKMB, CKMBINDEX, TROPONINI in the last 168 hours. BNP (last 3 results) No results for input(s): PROBNP in the last 8760 hours. HbA1C: No results for input(s): HGBA1C in the last 72 hours. CBG: Recent Labs  Lab 10/31/20 1610 10/31/20 2110 11/01/20 0713 11/01/20 1205 11/01/20 1602  GLUCAP 160* 192* 91 159* 116*   Lipid Profile: No results for  input(s): CHOL, HDL, LDLCALC, TRIG, CHOLHDL, LDLDIRECT in the last 72 hours. Thyroid Function Tests: No results for input(s): TSH, T4TOTAL, FREET4, T3FREE, THYROIDAB in the last 72 hours. Anemia Panel: No results for input(s): VITAMINB12, FOLATE, FERRITIN, TIBC, IRON, RETICCTPCT in the last 72 hours. Sepsis Labs: No results for input(s): PROCALCITON, LATICACIDVEN in the last 168 hours.  No results found for this or any previous visit (from the past 240 hour(s)).       Radiology Studies: No results found.      Scheduled Meds:  apixaban  5 mg Oral BID   divalproex  1,000 mg Oral Daily   escitalopram  20 mg Oral Daily   feeding supplement (GLUCERNA SHAKE)  237 mL Oral TID BM   insulin aspart  0-15 Units Subcutaneous TID WC   insulin aspart  0-5 Units Subcutaneous QHS   insulin aspart  4 Units Subcutaneous TID WC   insulin detemir  16 Units Subcutaneous BID   ketotifen  1 drop Both Eyes TID   multivitamin with minerals  1 tablet Oral Daily   polyethylene glycol  17 g Oral Daily   ramipril  5 mg Oral Daily   senna-docusate  1 tablet Oral QHS   thiamine  100 mg Oral Daily   vitamin B-12  1,000 mcg Oral Daily   Continuous Infusions:   LOS: 31 days    Time spent: 25  minutes    Edwin Dada, MD [facility]   To contact the attending provider between 7A-7P or the covering provider during after hours 7P-7A, please log into the web site [email or web address] and access using universal [place] password for that web site. If you do not have the password, please call the hospital operator.  11/01/2020, 6:58 PM

## 2020-11-02 DIAGNOSIS — G9341 Metabolic encephalopathy: Secondary | ICD-10-CM | POA: Diagnosis not present

## 2020-11-02 LAB — GLUCOSE, CAPILLARY
Glucose-Capillary: 101 mg/dL — ABNORMAL HIGH (ref 70–99)
Glucose-Capillary: 159 mg/dL — ABNORMAL HIGH (ref 70–99)
Glucose-Capillary: 171 mg/dL — ABNORMAL HIGH (ref 70–99)
Glucose-Capillary: 192 mg/dL — ABNORMAL HIGH (ref 70–99)

## 2020-11-02 MED ORDER — MELATONIN 5 MG PO TABS
5.0000 mg | ORAL_TABLET | Freq: Every day | ORAL | Status: DC
  Administered 2020-11-02 – 2020-11-19 (×18): 5 mg via ORAL
  Filled 2020-11-02 (×21): qty 1

## 2020-11-02 NOTE — TOC Progression Note (Signed)
Transition of Care [facility]) - Progression Note    Patient Details  Name: Daisy Watkins MRN: [id number] Date of Birth: 04/05/1932  Transition of Care [facility]) CM/SW Contact  Shelbie Hutching, RN Phone Number: 11/02/2020, 2:42 PM  Clinical Narrative:     [facility] team working on [facility] for DSS for Medicaid application.   RNCM did reach out to [employer] and she has no female beds available at any of her group homes.  TOC will cont to look for placement.  Expected Discharge Plan: Assisted Living Barriers to Discharge: Family Issues, Other (must enter comment) (Medicaid application pending-no ALF bed)  Expected Discharge Plan and Services Expected Discharge Plan: Assisted Living   Discharge Planning Services: CM Consult   Living arrangements for the past 2 months: Single Family Home Expected Discharge Date: 10/03/20               DME Arranged: N/A DME Agency: NA       HH Arranged: NA           Social Determinants of Health (SDOH) Interventions    Readmission Risk Interventions No flowsheet data found.

## 2020-11-02 NOTE — Plan of Care (Signed)
Some periods of agitation, received ativan during course of night and melatonin to aid in sleep.

## 2020-11-02 NOTE — Progress Notes (Addendum)
Physical Therapy Treatment Patient Details Name: Daisy Watkins MRN: [id number] DOB: [birth year] Today's Date: 11/02/2020    History of Present Illness 87 y/o admitted on 09/30/20 with c/c of AMS with confusion and agitation. Pt recently d/c on 09/15/20 after being admitted for change in mental status. PMH: anxiety, DVT, DM, GERD, insomnia, HTN, HLD, insomnia, myalgia.    PT Comments    Pt seen for PT tx with pt responding well to music playing in room. Pt requires max encouragement to transfer to EOB but completes bed mobility with supervision. Pt declines ambulation 2/2 reporting "you will get [place] into trouble" despite PT educating pt on ability to get OOB with PT assistance. Pt returns to bed & performs BLE SLR with cuing for technique; pt demonstrates BLE weakness, L>R.  Will continue to follow pt acutely & progress mobility as able. Continue to recommend 24 hr supervision upon d/c.   Date of PT goals updated.  Addendum: pt noted to have BLE edema.   Follow Up Recommendations  Supervision/Assistance - 24 hour;Supervision for mobility/OOB     Equipment Recommendations   (TBD at next venue of care)    Recommendations for Other Services       Precautions / Restrictions Precautions Precautions: Fall Precaution Comments: dementia Restrictions Weight Bearing Restrictions: No    Mobility  Bed Mobility Overal bed mobility: Needs Assistance Bed Mobility: Supine to Sit;Sit to Supine     Supine to sit: Supervision;HOB elevated Sit to supine: Supervision;HOB elevated   General bed mobility comments: use of bed rails, scoots to [facility] with bed flat with supervision    Transfers Overall transfer level:  (pt declines)                  Ambulation/Gait                 Stairs             Wheelchair Mobility    Modified Rankin (Stroke Patients Only)       Balance Overall balance assessment: Needs assistance Sitting-balance support: Feet supported;Bilateral  upper extremity supported Sitting balance-Leahy Scale: Good Sitting balance - Comments: supervision sitting EOB                                    Cognition Arousal/Alertness: Awake/alert Behavior During Therapy: WFL for tasks assessed/performed Overall Cognitive Status: History of cognitive impairments - at baseline Area of Impairment: Safety/judgement;Attention;Problem solving;Awareness                 Orientation Level: Disoriented to;Situation;Time (reports she's at "[facility]")   Memory: Decreased short-term memory Following Commands: Follows one step commands inconsistently;Follows one step commands with increased time Safety/Judgement: Decreased awareness of safety Awareness: Anticipatory;Emergent   General Comments: Pleasantly confused      Exercises General Exercises - Lower Extremity Straight Leg Raises: Both;Supine;Strengthening;5 reps    General Comments        Pertinent Vitals/Pain Pain Assessment: No/denies pain    Home Living                      Prior Function            PT Goals (current goals can now be found in the care plan section) Acute Rehab PT Goals Patient Stated Goal: none stated PT Goal Formulation: With patient Time For Goal Achievement: 11/16/20 Potential to Achieve Goals: Fair Progress towards  PT goals: Progressing toward goals    Frequency    Min 2X/week      PT Plan Current plan remains appropriate    Co-evaluation              AM-PAC PT "6 Clicks" Mobility   Outcome Measure  Help needed turning from your back to your side while in a flat bed without using bedrails?: None Help needed moving from lying on your back to sitting on the side of a flat bed without using bedrails?: A Little Help needed moving to and from a bed to a chair (including a wheelchair)?: A Little Help needed standing up from a chair using your arms (e.g., wheelchair or bedside chair)?: A Little Help needed to walk in  hospital room?: A Little Help needed climbing 3-5 steps with a railing? : A Lot 6 Click Score: 18    End of Session   Activity Tolerance: Patient tolerated treatment well Patient left: in bed;with call bell/phone within reach;with bed alarm set Nurse Communication: Mobility status PT Visit Diagnosis: Unsteadiness on feet (R26.81);Muscle weakness (generalized) (M62.81)     Time: [id number] PT Time Calculation (min) (ACUTE ONLY): 11 min  Charges:  $Therapeutic Activity: 8-22 mins                     Daisy Watkins, PT, DPT 11/02/20, 3:38 PM    Waunita Schooner 11/02/2020, 3:32 PM

## 2020-11-02 NOTE — Progress Notes (Signed)
PROGRESS NOTE    Daisy Watkins  [id number] DOB: 29-May-1932 DOA: 09/30/2020 PCP: Daisy Roys, DO    Brief Narrative:  [name] is a 87 y.o. F with HTN, diabetes, meningioma, A. fib/history DVT on Eliquis, and CAD who presented with agitation.    See interim discharge summary from 10/03/20         Assessment & Plan:     Paroxysmal atrial fibrillation History of DVT - Continue Eliquis  Dementia behavioral disturbance She has pleasant, easily redirectable, requires minimal prompting. -Continue Lexapro and Depakote - Continue melatonin at night  Diabetes Glucose controlled - Continue Levemir - Continue mealtime insulin - Continue sliding scale corrections  Hypertension Blood pressure controlled - Continue ramipril   DVT prophylaxis: Not applicable, on Eliquis Code Status: full Family Communication:  Disposition Plan:      Status is: Inpatient   Remains inpatient appropriate because:Unsafe d/c plan   Dispo: The patient is from: Home              Anticipated d/c is to: ALF              Patient currently is medically stable to d/c.              Difficult to place patient Yes       No intake/output data recorded. No intake/output data recorded.     Consultants:  None  Procedures: None  Antimicrobials: None   Subjective: No new complaints.  No headache, abdominal pain, chest pain, dyspnea, cough.   Objective: Vitals:   11/01/20 1604 11/01/20 2013 11/02/20 0528 11/02/20 0757  BP: (!) 129/57 (!) 164/55 (!) 129/55 (!) 101/54  Pulse: 62 68 68 63  Resp: '16 18 19 19  '$ Temp: 97.7 F (36.5 C) 98.7 F (37.1 C) 98.6 F (37 C) 98.2 F (36.8 C)  TempSrc:      SpO2: 99% 97% 95% 95%  Weight:      Height:       No intake or output data in the 24 hours ending 11/02/20 1003  Filed Weights   09/30/20 1031 10/11/20 0601 10/23/20 1409  Weight: 74.5 kg 75.1 kg 76.1 kg    Examination:  General appearance: Adult female, sleeping, arouses  easily, makes eye contact, no acute distress     HEENT:    Skin:  Cardiac: RRR, no murmurs, no JVD, no lower extremity edema Respiratory: Normal respiratory rate and rhythm, lungs clear without rales or wheezes Abdomen: Abdomen soft without tenderness palpation or guarding, no rigidity or rebound, no ascites or distention MSK:  Neuro: Arouses easily and responds to questions, makes eye contact, mild weakness, symmetric strength, speech fluent Psych: Judgment and insight appear impaired, affect blunted    Data Reviewed: I have personally reviewed following labs and imaging studies  CBC: Recent Labs  Lab 10/29/20 0455 11/01/20 0429  WBC 5.3 5.5  HGB 11.9* 11.7*  HCT 37.3 36.1  MCV 90.8 90.7  PLT 177 123456   Basic Metabolic Panel: No results for input(s): NA, K, CL, CO2, GLUCOSE, BUN, CREATININE, CALCIUM, MG, PHOS in the last 168 hours. GFR: Estimated Creatinine Clearance: 52.6 mL/min (by C-G formula based on SCr of 0.59 mg/dL). Liver Function Tests: No results for input(s): AST, ALT, ALKPHOS, BILITOT, PROT, ALBUMIN in the last 168 hours. No results for input(s): LIPASE, AMYLASE in the last 168 hours. No results for input(s): AMMONIA in the last 168 hours. Coagulation Profile: No results for input(s): INR, PROTIME in the last  168 hours. Cardiac Enzymes: No results for input(s): CKTOTAL, CKMB, CKMBINDEX, TROPONINI in the last 168 hours. BNP (last 3 results) No results for input(s): PROBNP in the last 8760 hours. HbA1C: No results for input(s): HGBA1C in the last 72 hours. CBG: Recent Labs  Lab 11/01/20 0713 11/01/20 1205 11/01/20 1602 11/01/20 2043 11/02/20 0800  GLUCAP 91 159* 116* 102* 101*   Lipid Profile: No results for input(s): CHOL, HDL, LDLCALC, TRIG, CHOLHDL, LDLDIRECT in the last 72 hours. Thyroid Function Tests: No results for input(s): TSH, T4TOTAL, FREET4, T3FREE, THYROIDAB in the last 72 hours. Anemia Panel: No results for input(s): VITAMINB12, FOLATE,  FERRITIN, TIBC, IRON, RETICCTPCT in the last 72 hours. Sepsis Labs: No results for input(s): PROCALCITON, LATICACIDVEN in the last 168 hours.  No results found for this or any previous visit (from the past 240 hour(s)).       Radiology Studies: No results found.      Scheduled Meds:  apixaban  5 mg Oral BID   divalproex  1,000 mg Oral Daily   escitalopram  20 mg Oral Daily   feeding supplement (GLUCERNA SHAKE)  237 mL Oral TID BM   insulin aspart  0-15 Units Subcutaneous TID WC   insulin aspart  0-5 Units Subcutaneous QHS   insulin aspart  4 Units Subcutaneous TID WC   insulin detemir  16 Units Subcutaneous BID   ketotifen  1 drop Both Eyes TID   melatonin  5 mg Oral QHS   multivitamin with minerals  1 tablet Oral Daily   polyethylene glycol  17 g Oral Daily   ramipril  5 mg Oral Daily   senna-docusate  1 tablet Oral QHS   thiamine  100 mg Oral Daily   vitamin B-12  1,000 mcg Oral Daily   Continuous Infusions:   LOS: 32 days    Time spent: 25  minutes    Daisy Dada, MD [facility]   To contact the attending provider between 7A-7P or the covering provider during after hours 7P-7A, please log into the web site [email or web address] and access using universal [place] password for that web site. If you do not have the password, please call the hospital operator.  11/02/2020, 10:03 AM

## 2020-11-03 DIAGNOSIS — G9341 Metabolic encephalopathy: Secondary | ICD-10-CM | POA: Diagnosis not present

## 2020-11-03 LAB — GLUCOSE, CAPILLARY
Glucose-Capillary: 106 mg/dL — ABNORMAL HIGH (ref 70–99)
Glucose-Capillary: 114 mg/dL — ABNORMAL HIGH (ref 70–99)
Glucose-Capillary: 127 mg/dL — ABNORMAL HIGH (ref 70–99)
Glucose-Capillary: 146 mg/dL — ABNORMAL HIGH (ref 70–99)
Glucose-Capillary: 152 mg/dL — ABNORMAL HIGH (ref 70–99)
Glucose-Capillary: 198 mg/dL — ABNORMAL HIGH (ref 70–99)
Glucose-Capillary: 258 mg/dL — ABNORMAL HIGH (ref 70–99)

## 2020-11-03 NOTE — Progress Notes (Signed)
PROGRESS NOTE    ASHALA HINZ  [id number] DOB: 1933-02-01 DOA: 09/30/2020 PCP: Valerie Roys, DO    Brief Narrative:  Daisy Watkins is a 87 y.o. F with HTN, diabetes, meningioma, A. fib/history DVT on Eliquis, and CAD who presented with agitation.    See interim discharge summary from 10/03/20         Assessment & Plan:     Paroxysmal atrial fibrillation History of DVT - Continue Eliquis  Dementia with behavioral disturbance Patient is pleasant, easily redirectable, [name] minimal prompting, feed self. - Continue Lexapro, Depakote - Continue melatonin at night  Diabetes Glucoses are controlled - Continue Levemir, mealtime insulin - Continue sliding scale corrections  Hypertension Blood pressure controlled - Continue ramipril  Syncopal episode Last night nursing report that patient got up, appeared unsteady, then fell back on the bed, was unresponsive momentarily.  When she awoke, she was unable to describe the episode, but her vital signs were within normal limits, and she was asymptomatic.  Suspect this was an orthostatic episode.  No further work up required.         DVT prophylaxis: Not applicable, on Eliquis Code Status: full Family Communication:  Disposition Plan:      Status is: Inpatient   Remains inpatient appropriate because:Unsafe d/c plan   Dispo: The patient is from: Home              Anticipated d/c is to: ALF              Patient currently is medically stable to d/c.              Difficult to place patient Yes       No intake/output data recorded. No intake/output data recorded.     Consultants:  None  Procedures: None  Antimicrobials: None   Subjective: Syncope last night.  Otherwise no headache, chest pain, dyspnea, cough, confusion.   Objective: Vitals:   11/02/20 2351 11/02/20 2359 11/03/20 0514 11/03/20 0744  BP: (!) 134/50 (!) 154/59  (!) 132/59  Pulse: 66 64 60 71  Resp: '16  16 16  '$ Temp: 98.3 F (36.8  C)  98 F (36.7 C) 97.6 F (36.4 C)  TempSrc: Oral  Oral   SpO2: 98% 98% 99% 98%  Weight:      Height:       No intake or output data in the 24 hours ending 11/03/20 0915  Filed Weights   09/30/20 1031 10/11/20 0601 10/23/20 1409  Weight: 74.5 kg 75.1 kg 76.1 kg    Examination:  General appearance: Adult female, sleeping, have her (even from her, arouses easily, interactive.     HEENT:    Skin:  Cardiac: RRR, no murmurs, no lower extremity edema, no JVD Respiratory: Normal respiratory rate and rhythm, lungs clear without rales or wheeze s Abdomen: Abdomen soft no tenderness palpation or guarding, no ascites or distention MSK:  Neuro: Wakes immediately responds to questions, makes eye contact, attention normal, generally weak in all 4 extremities, but strength symmetric, face symmetric, extraocular moods intact, speech fluent Psych: Judgment and insight appear impaired by dementia, affect blunted    Data Reviewed: I have personally reviewed following labs and imaging studies  CBC: Recent Labs  Lab 10/29/20 0455 11/01/20 0429  WBC 5.3 5.5  HGB 11.9* 11.7*  HCT 37.3 36.1  MCV 90.8 90.7  PLT 177 123456   Basic Metabolic Panel: No results for input(s): NA, K, CL, CO2, GLUCOSE, BUN, CREATININE, CALCIUM,  MG, PHOS in the last 168 hours. GFR: Estimated Creatinine Clearance: 52.6 mL/min (by C-G formula based on SCr of 0.59 mg/dL). Liver Function Tests: No results for input(s): AST, ALT, ALKPHOS, BILITOT, PROT, ALBUMIN in the last 168 hours. No results for input(s): LIPASE, AMYLASE in the last 168 hours. No results for input(s): AMMONIA in the last 168 hours. Coagulation Profile: No results for input(s): INR, PROTIME in the last 168 hours. Cardiac Enzymes: No results for input(s): CKTOTAL, CKMB, CKMBINDEX, TROPONINI in the last 168 hours. BNP (last 3 results) No results for input(s): PROBNP in the last 8760 hours. HbA1C: No results for input(s): HGBA1C in the last 72  hours. CBG: Recent Labs  Lab 11/02/20 1659 11/02/20 2042 11/02/20 2359 11/03/20 0437 11/03/20 0742  GLUCAP 171* 192* 258* 198* 146*   Lipid Profile: No results for input(s): CHOL, HDL, LDLCALC, TRIG, CHOLHDL, LDLDIRECT in the last 72 hours. Thyroid Function Tests: No results for input(s): TSH, T4TOTAL, FREET4, T3FREE, THYROIDAB in the last 72 hours. Anemia Panel: No results for input(s): VITAMINB12, FOLATE, FERRITIN, TIBC, IRON, RETICCTPCT in the last 72 hours. Sepsis Labs: No results for input(s): PROCALCITON, LATICACIDVEN in the last 168 hours.  No results found for this or any previous visit (from the past 240 hour(s)).       Radiology Studies: No results found.      Scheduled Meds:  apixaban  5 mg Oral BID   divalproex  1,000 mg Oral Daily   escitalopram  20 mg Oral Daily   feeding supplement (GLUCERNA SHAKE)  237 mL Oral TID BM   insulin aspart  0-15 Units Subcutaneous TID WC   insulin aspart  0-5 Units Subcutaneous QHS   insulin aspart  4 Units Subcutaneous TID WC   insulin detemir  16 Units Subcutaneous BID   ketotifen  1 drop Both Eyes TID   melatonin  5 mg Oral QHS   multivitamin with minerals  1 tablet Oral Daily   polyethylene glycol  17 g Oral Daily   ramipril  5 mg Oral Daily   senna-docusate  1 tablet Oral QHS   thiamine  100 mg Oral Daily   vitamin B-12  1,000 mcg Oral Daily   Continuous Infusions:   LOS: 33 days    Time spent: 25   minutes    Edwin Dada, MD [facility]    11/03/2020, 9:15 AM

## 2020-11-03 NOTE — Care Plan (Signed)
Notified by another nurse that pt appeared to have a vagal response when she was getting up to walk to the BR.  This hasn't happened before.  She became more responsive and VS were WNL 134/50 and HR 66. FBS also checked  - 258.  Dr. Ree Kida and was relieved by VS.  Will continue to monitor.

## 2020-11-03 NOTE — Progress Notes (Signed)
Called back to bedside due to dizziness.  Patient reported dizziness with standing to nurse tech.  RN confirmed.  RN unable to do orthostatic vital signs due to dizziness with standing.  To me patient states she feels fine.  No fever, malaise, headache, weakness, numbness, speech change.  BP (!) 116/58 (BP Location: Right Arm)   Pulse 65   Temp 97.7 F (36.5 C) (Oral)   Resp 15   Ht '5\' 6"'$  (1.676 m)   Wt 76.1 kg   LMP  (LMP Unknown)   SpO2 97%   BMI 27.08 kg/m   General: Alert and in no distress.  Responds promptly to questions.  Eye contact appropriate. HEENT: Corneas clear, conjunctivae and sclerae normal without injection or icterus, lids and lashes normal.  Visual tracking smooth.  OP moist without erythema, exudates, cobblestoning, or ulcers.  No airway deformities.  Neck supple.   Cardiac: RRR, nl S1-S2, no murmurs, rubs, gallops.  No JVD or edema. Respiratory: Normal respiratory rate and rhythm.  CTAB without rales or wheezes. Abdomen: BS present.  No TTP or rebound all quadrants.  No masses or organomegaly.   Extremities: No deformities/injuries.  5/5 grip strength and upper extremity flexion/extension, symmetrically.  Extremities are warm and well-perfused. Neuro: Pupils are equal and reactive.  Extraocular movements are intact, without nystagmus.  Cranial nerve 5 is within normal limits.  Cranial nerve 7 is symmetrical.  Cranial nerve 8 is within normal limits.  Cranial nerves 9 and 10 reveal equal palate elevation.  Cranial nerve 11 reveals sternocleidomastoid strong.  Cranial nerve 12 is midline.  Motor strength testing is 5/5 in the upper and lower extremities bilaterally with normal motor, tone and bulk.   Sensory examination is intact to light touch.  Romberg maneuver is negative for pathology.  Finger-to-nose testing is within normal limits. The patient is oriented to  self and hospital only, which is her baseline.  Speech is fluent.  Naming is grossly intact.  Recall seems impaired  by dementia, at baseline.       Assessment: Does not appear dehydrated.  Glucose normal.  No cardiac symptoms nor findings on exam.  Hgb normal 2 days ago.  Depakote and Ativan can both cause dizziness. - Continue to monitor

## 2020-11-03 NOTE — Progress Notes (Addendum)
Around 1100 NT called this writer reported patient eased herself to laying position after standing up.  Patient did not lost consciousness, but feels dizzy.  She is alert to self as in the morning.  Patient laying down with eyes closed.  Easily aroused.  Minimal speech as prior, speech clear. VSS.  CBG 152.  Dr. Loleta Books has been notified.  No new orders at this time.  Will continue to monitor.    Attempted to check orthostatic VS but patient unable to maintain sitting position.  Patient c/o dizziness which improved with laying back in bed. Dr. Loleta Books made aware.  No new orders at this time.

## 2020-11-04 DIAGNOSIS — G9341 Metabolic encephalopathy: Secondary | ICD-10-CM | POA: Diagnosis not present

## 2020-11-04 LAB — GLUCOSE, CAPILLARY
Glucose-Capillary: 96 mg/dL (ref 70–99)
Glucose-Capillary: 82 mg/dL (ref 70–99)
Glucose-Capillary: 108 mg/dL — ABNORMAL HIGH (ref 70–99)
Glucose-Capillary: 88 mg/dL (ref 70–99)

## 2020-11-04 NOTE — Progress Notes (Signed)
PROGRESS NOTE    Daisy Watkins  [id number] DOB: Aug 06, 1932 DOA: 09/30/2020 PCP: Daisy Roys, DO    Brief Narrative:  Daisy Watkins is a 87 y.o. F with HTN, diabetes, meningioma, A. fib/history DVT on Eliquis, and CAD who presented with agitation.    See interim discharge summary from 10/03/20         Assessment & Plan:     Paroxysmal atrial fibrillation History of DVT - Continue Eliquis  Dementia with behavioral disturbance Patient is pleasant, easily redirectable, [name] minimal prompting, feed self. - Continue Lexapro, Depakote - Continue melatonin at night  Diabetes Glucoses normal - Continue Levemir, mealtime insulin - Continue sliding scale corrections  Hypertension Blood pressure controlled - Continue Avapro          DVT prophylaxis: Not applicable, on Eliquis Code Status: full Family Communication:  Disposition Plan:      Status is: Inpatient   Remains inpatient appropriate because:Unsafe d/c plan   Dispo: The patient is from: Home              Anticipated d/c is to: ALF              Patient currently is medically stable to d/c.              Difficult to place patient Yes         Consultants:  None  Procedures: None  Antimicrobials: None   Subjective: No further syncope, has no complaints.  No headache, chest pain, dyspnea, abdominal pain.  No focal weakness.   Objective: Vitals:   11/04/20 0816 11/04/20 1145 11/04/20 1457 11/04/20 1553  BP: (!) 142/52 (!) 132/54 (!) 130/59 (!) 125/57  Pulse: (!) 56 71 (!) 51 (!) 56  Resp: '16 20 16 16  '$ Temp: 97.8 F (36.6 C) (!) 97.5 F (36.4 C) 97.9 F (36.6 C) 98.4 F (36.9 C)  TempSrc:  Oral  Oral  SpO2: 98% 97% 94% 98%  Weight:      Height:        Intake/Output Summary (Last 24 hours) at 11/04/2020 1633 Last data filed at 11/04/2020 1147 Gross per 24 hour  Intake 0 ml  Output 950 ml  Net -950 ml    Filed Weights   09/30/20 1031 10/11/20 0601 10/23/20 1409  Weight:  74.5 kg 75.1 kg 76.1 kg    Examination:  General appearance: Female, sleeping, arouses easily.  Interactive.  Makes eye contact.     HEENT: Face symmetric, extraocular movements intact.  No nystagmus. Skin:  Cardiac: Regular rate and rhythm, no lower extremity edema, no murmurs, no JVD Respiratory: Respiratory effort is normal, lungs clear without rales or wheezes Abdomen: Abdomen soft without tenderness palpation or guarding, no ascites or distention MSK:  Neuro: Sleeping but arouses easily, makes eye contact, generalized weakness but no focal weakness, symmetric sensation.  Speech fluent. Psych:      Data Reviewed: I have personally reviewed following labs and imaging studies  CBC: Recent Labs  Lab 10/29/20 0455 11/01/20 0429  WBC 5.3 5.5  HGB 11.9* 11.7*  HCT 37.3 36.1  MCV 90.8 90.7  PLT 177 123456   Basic Metabolic Panel: No results for input(s): NA, K, CL, CO2, GLUCOSE, BUN, CREATININE, CALCIUM, MG, PHOS in the last 168 hours. GFR: Estimated Creatinine Clearance: 52.6 mL/min (by C-G formula based on SCr of 0.59 mg/dL). Liver Function Tests: No results for input(s): AST, ALT, ALKPHOS, BILITOT, PROT, ALBUMIN in the last 168 hours. No results for  input(s): LIPASE, AMYLASE in the last 168 hours. No results for input(s): AMMONIA in the last 168 hours. Coagulation Profile: No results for input(s): INR, PROTIME in the last 168 hours. Cardiac Enzymes: No results for input(s): CKTOTAL, CKMB, CKMBINDEX, TROPONINI in the last 168 hours. BNP (last 3 results) No results for input(s): PROBNP in the last 8760 hours. HbA1C: No results for input(s): HGBA1C in the last 72 hours. CBG: Recent Labs  Lab 11/03/20 1730 11/03/20 2125 11/04/20 0814 11/04/20 1145 11/04/20 1553  GLUCAP 114* 127* 96 82 108*   Lipid Profile: No results for input(s): CHOL, HDL, LDLCALC, TRIG, CHOLHDL, LDLDIRECT in the last 72 hours. Thyroid Function Tests: No results for input(s): TSH, T4TOTAL,  FREET4, T3FREE, THYROIDAB in the last 72 hours. Anemia Panel: No results for input(s): VITAMINB12, FOLATE, FERRITIN, TIBC, IRON, RETICCTPCT in the last 72 hours. Sepsis Labs: No results for input(s): PROCALCITON, LATICACIDVEN in the last 168 hours.  No results found for this or any previous visit (from the past 240 hour(s)).       Radiology Studies: No results found.      Scheduled Meds:  apixaban  5 mg Oral BID   divalproex  1,000 mg Oral Daily   escitalopram  20 mg Oral Daily   feeding supplement (GLUCERNA SHAKE)  237 mL Oral TID BM   insulin aspart  0-15 Units Subcutaneous TID WC   insulin aspart  0-5 Units Subcutaneous QHS   insulin aspart  4 Units Subcutaneous TID WC   insulin detemir  16 Units Subcutaneous BID   ketotifen  1 drop Both Eyes TID   melatonin  5 mg Oral QHS   multivitamin with minerals  1 tablet Oral Daily   polyethylene glycol  17 g Oral Daily   ramipril  5 mg Oral Daily   senna-docusate  1 tablet Oral QHS   thiamine  100 mg Oral Daily   vitamin B-12  1,000 mcg Oral Daily   Continuous Infusions:   LOS: 34 days    Time spent: 25   minutes    Edwin Dada, MD [facility]    11/04/2020, 4:33 PM

## 2020-11-05 DIAGNOSIS — G9341 Metabolic encephalopathy: Secondary | ICD-10-CM | POA: Diagnosis not present

## 2020-11-05 LAB — GLUCOSE, CAPILLARY
Glucose-Capillary: 147 mg/dL — ABNORMAL HIGH (ref 70–99)
Glucose-Capillary: 168 mg/dL — ABNORMAL HIGH (ref 70–99)
Glucose-Capillary: 108 mg/dL — ABNORMAL HIGH (ref 70–99)
Glucose-Capillary: 206 mg/dL — ABNORMAL HIGH (ref 70–99)

## 2020-11-05 NOTE — Progress Notes (Signed)
PROGRESS NOTE    Daisy Watkins  [id number] DOB: 1932-06-17 DOA: 09/30/2020 PCP: Valerie Roys, DO    Brief Narrative:  Daisy Watkins is a 87 y.o. F with HTN, diabetes, meningioma, A. fib/history DVT on Eliquis, and CAD who presented with agitation.    See interim discharge summary from 10/03/20         Assessment & Plan:   Please see interim discharge summary from 10/03/2020    History of DVT and paroxysmal atrial fibrillation - Continue Eliquis  Dementia Patient is pleasant, easily retractable, requires minimal prompting, feeds herself - Continue Lexapro, Depakote - Continue melatonin at night  Diabetes Glucose normal - Continue Levemir, mealtime insulin - Continue sliding scale corrections  Hypertension Blood pressure controlled - Continue ramipril           DVT prophylaxis: Not applicable, on Eliquis Code Status: full Family Communication:  Disposition Plan:      Status is: Inpatient   Remains inpatient appropriate because:Unsafe d/c plan   Dispo: The patient is from: Home              Anticipated d/c is to: ALF              Patient currently is medically stable to d/c.              Difficult to place patient Yes         Consultants:  None  Procedures: None  Antimicrobials: None   Subjective: Asymptomatic.  No headache, dyspnea, chest pain, abdominal pain, focal weakness, numbness, speech change.  No loss of consciousness.   Objective: Vitals:   11/04/20 1553 11/04/20 2111 11/05/20 0516 11/05/20 0750  BP: (!) 125/57 (!) 146/63 (!) 125/52 (!) 130/56  Pulse: (!) 56 (!) 56 (!) 51 (!) 52  Resp: '16 16 16 16  '$ Temp: 98.4 F (36.9 C) 98.1 F (36.7 C) 97.8 F (36.6 C) 98.2 F (36.8 C)  TempSrc: Oral Oral Oral   SpO2: 98% 97% 97% 96%  Weight:      Height:        Intake/Output Summary (Last 24 hours) at 11/05/2020 0816 Last data filed at 11/05/2020 [id number] Gross per 24 hour  Intake 0 ml  Output 1100 ml  Net -1100 ml    Filed  Weights   09/30/20 1031 10/11/20 0601 10/23/20 1409  Weight: 74.5 kg 75.1 kg 76.1 kg    Examination:  General appearance: Adult female, watching television, no acute distress     HEENT:   Skin:  Cardiac: RRR, no murmurs, no lower extremity edema Respiratory: Normal respiratory rate and rhythm, lungs clear without rales or wheezes Abdomen: Abdomen soft tenderness palpation or guarding, no ascites or distention MSK:  Neuro: Face symmetric, extraocular movements intact, moves upper extremities with generalized weakness but symmetric strength, speech fluent Psych: Awake and alert, oriented to hospital, responds to questions properly, attention normal, judgment and insight appear impaired by dementia    Data Reviewed: I have personally reviewed following labs and imaging studies  CBC: Recent Labs  Lab 11/01/20 0429  WBC 5.5  HGB 11.7*  HCT 36.1  MCV 90.7  PLT 123456   Basic Metabolic Panel: No results for input(s): NA, K, CL, CO2, GLUCOSE, BUN, CREATININE, CALCIUM, MG, PHOS in the last 168 hours. GFR: Estimated Creatinine Clearance: 52.6 mL/min (by C-G formula based on SCr of 0.59 mg/dL). Liver Function Tests: No results for input(s): AST, ALT, ALKPHOS, BILITOT, PROT, ALBUMIN in the last 168 hours. No  results for input(s): LIPASE, AMYLASE in the last 168 hours. No results for input(s): AMMONIA in the last 168 hours. Coagulation Profile: No results for input(s): INR, PROTIME in the last 168 hours. Cardiac Enzymes: No results for input(s): CKTOTAL, CKMB, CKMBINDEX, TROPONINI in the last 168 hours. BNP (last 3 results) No results for input(s): PROBNP in the last 8760 hours. HbA1C: No results for input(s): HGBA1C in the last 72 hours. CBG: Recent Labs  Lab 11/04/20 0814 11/04/20 1145 11/04/20 1553 11/04/20 2111 11/05/20 0751  GLUCAP 96 82 108* 88 147*   Lipid Profile: No results for input(s): CHOL, HDL, LDLCALC, TRIG, CHOLHDL, LDLDIRECT in the last 72 hours. Thyroid  Function Tests: No results for input(s): TSH, T4TOTAL, FREET4, T3FREE, THYROIDAB in the last 72 hours. Anemia Panel: No results for input(s): VITAMINB12, FOLATE, FERRITIN, TIBC, IRON, RETICCTPCT in the last 72 hours. Sepsis Labs: No results for input(s): PROCALCITON, LATICACIDVEN in the last 168 hours.  No results found for this or any previous visit (from the past 240 hour(s)).       Radiology Studies: No results found.      Scheduled Meds:  apixaban  5 mg Oral BID   divalproex  1,000 mg Oral Daily   escitalopram  20 mg Oral Daily   feeding supplement (GLUCERNA SHAKE)  237 mL Oral TID BM   insulin aspart  0-15 Units Subcutaneous TID WC   insulin aspart  0-5 Units Subcutaneous QHS   insulin aspart  4 Units Subcutaneous TID WC   insulin detemir  16 Units Subcutaneous BID   ketotifen  1 drop Both Eyes TID   melatonin  5 mg Oral QHS   multivitamin with minerals  1 tablet Oral Daily   polyethylene glycol  17 g Oral Daily   ramipril  5 mg Oral Daily   senna-docusate  1 tablet Oral QHS   thiamine  100 mg Oral Daily   vitamin B-12  1,000 mcg Oral Daily   Continuous Infusions:   LOS: 35 days    Time spent: 15   minutes    Edwin Dada, MD [facility]    11/05/2020, 8:16 AM

## 2020-11-05 NOTE — Progress Notes (Signed)
Physical Therapy Treatment Patient Details Name: Daisy Watkins MRN: [id number] DOB: 12-29-1932 Today's Date: 11/05/2020    History of Present Illness 87 y/o admitted on 09/30/20 with c/c of AMS with confusion and agitation. Pt recently d/c on 09/15/20 after being admitted for change in mental status. PMH: anxiety, DVT, DM, GERD, insomnia, HTN, HLD, insomnia, myalgia.    PT Comments    Pt seen for PT tx with sister present & encouraging pt to participate. Pt is able to complete bed mobility with supervision but requires mod/max assist to safely pivot to recliner. Pt declines further mobility but requests snack, which PT provides. Will continue to follow pt acutely to progress mobility as able.     Follow Up Recommendations  Supervision/Assistance - 24 hour;Supervision for mobility/OOB     Equipment Recommendations   (TBD in next venue)    Recommendations for Other Services       Precautions / Restrictions Precautions Precautions: Fall Precaution Comments: dementia Restrictions Weight Bearing Restrictions: No    Mobility  Bed Mobility   Bed Mobility: Supine to Sit     Supine to sit: Supervision;HOB elevated     General bed mobility comments: use of bed rails    Transfers Overall transfer level: Needs assistance   Transfers: Stand Pivot Transfers   Stand pivot transfers: Mod assist;Max assist       General transfer comment: stand pivot bed>recliner with mod/max assist as pt demonstrates worsening balance during transfer and requires increased assistance to safely pivot to chair seat  Ambulation/Gait                 Stairs             Wheelchair Mobility    Modified Rankin (Stroke Patients Only)       Balance Overall balance assessment: Needs assistance Sitting-balance support: Feet supported;Bilateral upper extremity supported Sitting balance-Leahy Scale: Good     Standing balance support: Bilateral upper extremity supported;During  functional activity Standing balance-Leahy Scale: Poor                              Cognition Arousal/Alertness: Awake/alert Behavior During Therapy: WFL for tasks assessed/performed Overall Cognitive Status: History of cognitive impairments - at baseline Area of Impairment: Safety/judgement;Attention;Problem solving;Awareness                       Following Commands: Follows one step commands inconsistently;Follows one step commands with increased time Safety/Judgement: Decreased awareness of safety Awareness: Anticipatory;Emergent   General Comments: Pleasantly confused      Exercises      General Comments        Pertinent Vitals/Pain Pain Assessment: No/denies pain    Home Living                      Prior Function            PT Goals (current goals can now be found in the care plan section) Acute Rehab PT Goals Patient Stated Goal: none stated PT Goal Formulation: With patient Time For Goal Achievement: 11/16/20 Potential to Achieve Goals: Fair Progress towards PT goals: Progressing toward goals    Frequency    Min 2X/week      PT Plan Current plan remains appropriate    Co-evaluation              AM-PAC PT "6 Clicks" Mobility   Outcome  Measure  Help needed turning from your back to your side while in a flat bed without using bedrails?: None Help needed moving from lying on your back to sitting on the side of a flat bed without using bedrails?: A Little Help needed moving to and from a bed to a chair (including a wheelchair)?: A Lot Help needed standing up from a chair using your arms (e.g., wheelchair or bedside chair)?: A Little Help needed to walk in hospital room?: A Lot Help needed climbing 3-5 steps with a railing? : A Lot 6 Click Score: 16    End of Session   Activity Tolerance: Patient tolerated treatment well (self limiting) Patient left: in chair;with chair alarm set;with call bell/phone within  reach;with family/visitor present Nurse Communication: Mobility status PT Visit Diagnosis: Unsteadiness on feet (R26.81);Muscle weakness (generalized) (M62.81)     Time: [id number] PT Time Calculation (min) (ACUTE ONLY): 8 min  Charges:  $Therapeutic Activity: 8-22 mins                     Lavone Nian, PT, DPT 11/05/20, 3:53 PM    Waunita Schooner 11/05/2020, 3:51 PM

## 2020-11-05 NOTE — Progress Notes (Signed)
Nutrition Follow-up  DOCUMENTATION CODES:  Not applicable  INTERVENTION:  Continue current diet as ordered  Glucerna Shake po TID, each supplement provides 220 kcal and 10 grams of protein Multivitamin with minerals daily Nursing to assist pt with meals New measured weight  NUTRITION DIAGNOSIS:  Inadequate oral intake related to poor appetite as evidenced by per patient/family report, meal completion < 50%.  GOAL:  Patient will meet greater than or equal to 90% of their needs  MONITOR:  PO intake, Supplement acceptance, Weight trends  REASON FOR ASSESSMENT:  Malnutrition Screening Tool    ASSESSMENT:  86 y.o. female brought to ED from home with AMS. Family reports that pt was agitated earlier in the day and then became unresponsive. PMH relevant for DM, GERD, HTN, HLD, Vitamin B12 and D deficiencies, and atrial fibrillation.  Pt remains medically stable awaiting discharge, difficult placement. Will request new weight as last was ~2 weeks ago.  Average Meal Intake: 7/3-7/5: 0% intake x 1 recorded meal 7/6-7/14: 15% intake x 23 recorded meals (0-100%) 7/15-7/20: 33% intake x 3 recorded meals (25-50%) 7/21-7/26: 50% intake x 2 recorded meals 7/27-8/2: 92% intake x 3 recorded meals (75-100%) 8/3-8/8: limited meals recorded  Nutritionally Relevant Medications: Scheduled Meds:  feeding supplement (GLUCERNA SHAKE)  237 mL Oral TID BM   insulin aspart  0-15 Units Subcutaneous TID WC   insulin aspart  0-5 Units Subcutaneous QHS   insulin aspart  4 Units Subcutaneous TID WC   insulin detemir  16 Units Subcutaneous BID   multivitamin with minerals  1 tablet Oral Daily   polyethylene glycol  17 g Oral Daily   senna-docusate  1 tablet Oral QHS   thiamine  100 mg Oral Daily   vitamin B-12  1,000 mcg Oral Daily   PRN Meds: alum & mag hydroxide-simeth, mineral oil, ondansetron  Labs Reviewed: SBG ranges from 126-236 mg/dL over the last 24 hours HgbA1c 10.2% (6/17)  NUTRITION  - FOCUSED PHYSICAL EXAM: Flowsheet Row Most Recent Value  Orbital Region No depletion  Upper Arm Region No depletion  Thoracic and Lumbar Region No depletion  Buccal Region No depletion  Temple Region No depletion  Clavicle Bone Region No depletion  Clavicle and Acromion Bone Region No depletion  Scapular Bone Region No depletion  Dorsal Hand No depletion  Patellar Region No depletion  Anterior Thigh Region No depletion  Posterior Calf Region No depletion  Edema (RD Assessment) Mild  Hair Reviewed  Eyes Reviewed  Mouth Reviewed  Skin Reviewed  Nails Reviewed   Diet Order:   Diet Order             Diet Carb Modified Fluid consistency: Thin; Room service appropriate? Yes  Diet effective now                  EDUCATION NEEDS:  No education needs have been identified at this time  Skin:  Skin Assessment: Reviewed RN Assessment  Last BM:  8/5   Height:  Ht Readings from Last 1 Encounters:  09/30/20 '5\' 6"'$  (1.676 m)   Weight:  Wt Readings from Last 1 Encounters:  10/23/20 76.1 kg   Ideal Body Weight:  59.1 kg  BMI:  Body mass index is 27.08 kg/m.  Estimated Nutritional Needs:  Kcal:  1600-1800 kcal/d Protein:  80-90 g/d Fluid:  1.8-2L/d  Ranell Patrick, RD, LDN Clinical Dietitian Pager on [place]

## 2020-11-06 DIAGNOSIS — G9341 Metabolic encephalopathy: Secondary | ICD-10-CM | POA: Diagnosis not present

## 2020-11-06 LAB — GLUCOSE, CAPILLARY
Glucose-Capillary: 66 mg/dL — ABNORMAL LOW (ref 70–99)
Glucose-Capillary: 172 mg/dL — ABNORMAL HIGH (ref 70–99)
Glucose-Capillary: 133 mg/dL — ABNORMAL HIGH (ref 70–99)
Glucose-Capillary: 69 mg/dL — ABNORMAL LOW (ref 70–99)
Glucose-Capillary: 85 mg/dL (ref 70–99)
Glucose-Capillary: 168 mg/dL — ABNORMAL HIGH (ref 70–99)

## 2020-11-06 MED ORDER — DEXTROSE 50 % IV SOLN
INTRAVENOUS | Status: AC
  Filled 2020-11-06: qty 50

## 2020-11-06 MED ORDER — GLUCOSE 4 G PO CHEW
CHEWABLE_TABLET | ORAL | Status: AC
  Administered 2020-11-06: 4 g
  Filled 2020-11-06: qty 1

## 2020-11-06 NOTE — Progress Notes (Addendum)
PROGRESS NOTE    Daisy Watkins  [id number] DOB: 1932/04/08 DOA: 09/30/2020 PCP: Valerie Roys, DO    Brief Narrative:  Daisy Watkins is a 87 y.o. F with HTN, diabetes, meningioma, A. fib/history DVT on Eliquis, and CAD who presented with agitation.    See interim discharge summary from 10/03/20         Assessment & Plan:   Please see interim discharge summary from 10/03/2020    History of DVT and paroxysmal atrial fibrillation - Continue Eliquis  Dementia - Continue Lexapro and Depakote - Continue melatonin  Diabetes - Continue Levemir, mealtime insulin - Continue sliding scale corrections  ADDENDUM:  2 hypoglycemic events today, will stop mealtime insulin - COntinue Levemir - Continue SS corrections and monitor glucose   Hypertension Blood pressure ocntorlled - Continue ramipril          DVT prophylaxis: Not applicable, on Eliquis Code Status: full Family Communication:  Disposition Plan:      Status is: Inpatient   Remains inpatient appropriate because:Unsafe d/c plan   Dispo: The patient is from: Home              Anticipated d/c is to: ALF              Patient currently is medically stable to d/c.              Difficult to place patient Yes         Consultants:  None  Procedures: None  Antimicrobials: None   Subjective: Feels well.  No new complaints.  Objective: Vitals:   11/06/20 0019 11/06/20 0429 11/06/20 0745 11/06/20 1223  BP: (!) 122/49 (!) 112/52 (!) 114/57 (!) 128/44  Pulse: (!) 54 (!) 58 (!) 47 (!) 53  Resp: '16 17 18 17  '$ Temp: 98.2 F (36.8 C) 98.3 F (36.8 C) 98.6 F (37 C) 98.7 F (37.1 C)  TempSrc:   Oral Oral  SpO2: 95% 96% 97% 95%  Weight:      Height:       No intake or output data in the 24 hours ending 11/06/20 1344   Filed Weights   10/11/20 0601 10/23/20 1409 11/05/20 1401  Weight: 75.1 kg 76.1 kg 78.3 kg    Examination:  General appearance: Adult female, lying in bed, interactive, no  acute distress. HEENT:   Skin:  Cardiac:   Respiratory:   Abdomen:   MSK:  Neuro: Face symmetric, moves upper extremities generalized weakness but symmetric strength, speech fluent Psych: Awake, makes eye contact, judgment and insight appear impaired by dementia, attention somewhat distracted    Data Reviewed: I have personally reviewed following labs and imaging studies  CBC: Recent Labs  Lab 11/01/20 0429  WBC 5.5  HGB 11.7*  HCT 36.1  MCV 90.7  PLT 123456   Basic Metabolic Panel: No results for input(s): NA, K, CL, CO2, GLUCOSE, BUN, CREATININE, CALCIUM, MG, PHOS in the last 168 hours. GFR: Estimated Creatinine Clearance: 53.3 mL/min (by C-G formula based on SCr of 0.59 mg/dL). Liver Function Tests: No results for input(s): AST, ALT, ALKPHOS, BILITOT, PROT, ALBUMIN in the last 168 hours. No results for input(s): LIPASE, AMYLASE in the last 168 hours. No results for input(s): AMMONIA in the last 168 hours. Coagulation Profile: No results for input(s): INR, PROTIME in the last 168 hours. Cardiac Enzymes: No results for input(s): CKTOTAL, CKMB, CKMBINDEX, TROPONINI in the last 168 hours. BNP (last 3 results) No results for input(s): PROBNP in the  last 8760 hours. HbA1C: No results for input(s): HGBA1C in the last 72 hours. CBG: Recent Labs  Lab 11/05/20 1554 11/05/20 2043 11/06/20 0517 11/06/20 0819 11/06/20 1222  GLUCAP 108* 206* 66* 172* 133*   Lipid Profile: No results for input(s): CHOL, HDL, LDLCALC, TRIG, CHOLHDL, LDLDIRECT in the last 72 hours. Thyroid Function Tests: No results for input(s): TSH, T4TOTAL, FREET4, T3FREE, THYROIDAB in the last 72 hours. Anemia Panel: No results for input(s): VITAMINB12, FOLATE, FERRITIN, TIBC, IRON, RETICCTPCT in the last 72 hours. Sepsis Labs: No results for input(s): PROCALCITON, LATICACIDVEN in the last 168 hours.  No results found for this or any previous visit (from the past 240 hour(s)).       Radiology  Studies: No results found.      Scheduled Meds:  apixaban  5 mg Oral BID   divalproex  1,000 mg Oral Daily   escitalopram  20 mg Oral Daily   feeding supplement (GLUCERNA SHAKE)  237 mL Oral TID BM   insulin aspart  0-15 Units Subcutaneous TID WC   insulin aspart  0-5 Units Subcutaneous QHS   insulin aspart  4 Units Subcutaneous TID WC   insulin detemir  16 Units Subcutaneous BID   ketotifen  1 drop Both Eyes TID   melatonin  5 mg Oral QHS   multivitamin with minerals  1 tablet Oral Daily   polyethylene glycol  17 g Oral Daily   ramipril  5 mg Oral Daily   senna-docusate  1 tablet Oral QHS   thiamine  100 mg Oral Daily   vitamin B-12  1,000 mcg Oral Daily   Continuous Infusions:   LOS: 36 days    Time spent: 15 minutes    Edwin Dada, MD [facility]    11/06/2020, 1:44 PM

## 2020-11-06 NOTE — Progress Notes (Signed)
PT Cancellation Note  Patient Details Name: Daisy Watkins MRN: [id number] DOB: [birth year]   Cancelled Treatment:     PT attempt. Pt refused. " I want to eat before I walk." Author discussed lack of OOB activity and attempted to re-orient pt without success. Will continue efforts to progress as able per current POC.    [name] 11/06/2020, 11:43 AM

## 2020-11-06 NOTE — Plan of Care (Addendum)
Patient's dinner CBG was 69 and just after receiving result, NT witnessed patient have syncope episode on the bed.  Reportedly, I was called to the room and the NT indicated patient was only non-responsive for a few seconds - but remained safe in the bed.  Vital signs were stable.  Patient assisted with dinner and also given 1 glucose tablet.  Recheck CBG was 85. DR. Notified of event.  Looking back  - this is the 2nd low CBG in the past 12hrs.  0515 was also 66.

## 2020-11-06 NOTE — Progress Notes (Signed)
PT Cancellation Note  Patient Details Name: Daisy Watkins MRN: [id number] DOB: 10/03/1932   Cancelled Treatment:     Pt unwilling to get up and ambulate with PT however was observed ambulating to BR with RN staff with +1 assistance (CGA). Pt's cognition has greatly impacted  progress with PT. Acute PT will continue to follow and progress as able per POC. Recommend DC to ALF/memory unit.    [name] 11/06/2020, 1:30 PM

## 2020-11-07 DIAGNOSIS — G9341 Metabolic encephalopathy: Secondary | ICD-10-CM | POA: Diagnosis not present

## 2020-11-07 LAB — CBC
HCT: 36.9 % (ref 36.0–46.0)
Hemoglobin: 12 g/dL (ref 12.0–15.0)
MCH: 29.8 pg (ref 26.0–34.0)
MCHC: 32.5 g/dL (ref 30.0–36.0)
MCV: 91.6 fL (ref 80.0–100.0)
Platelets: 147 10*3/uL — ABNORMAL LOW (ref 150–400)
RBC: 4.03 MIL/uL (ref 3.87–5.11)
RDW: 14.2 % (ref 11.5–15.5)
WBC: 4.9 10*3/uL (ref 4.0–10.5)
nRBC: 0 % (ref 0.0–0.2)

## 2020-11-07 LAB — GLUCOSE, CAPILLARY
Glucose-Capillary: 77 mg/dL (ref 70–99)
Glucose-Capillary: 103 mg/dL — ABNORMAL HIGH (ref 70–99)
Glucose-Capillary: 68 mg/dL — ABNORMAL LOW (ref 70–99)
Glucose-Capillary: 170 mg/dL — ABNORMAL HIGH (ref 70–99)

## 2020-11-07 LAB — BASIC METABOLIC PANEL
Anion gap: 8 (ref 5–15)
BUN: 23 mg/dL (ref 8–23)
CO2: 29 mmol/L (ref 22–32)
Calcium: 9.3 mg/dL (ref 8.9–10.3)
Chloride: 105 mmol/L (ref 98–111)
Creatinine, Ser: 0.69 mg/dL (ref 0.44–1.00)
GFR, Estimated: >60 mL/min (ref >60)
Glucose, Bld: 70 mg/dL (ref 70–99)
Potassium: 4.2 mmol/L (ref 3.5–5.1)
Sodium: 142 mmol/L (ref 135–145)

## 2020-11-07 MED ORDER — INSULIN ASPART 100 UNIT/ML IJ SOLN
0.0000 [IU] | Freq: Three times a day (TID) | INTRAMUSCULAR | Status: DC
  Administered 2020-11-08 (×2): 1 [IU] via SUBCUTANEOUS
  Administered 2020-11-09: 2 [IU] via SUBCUTANEOUS
  Administered 2020-11-09: 1 [IU] via SUBCUTANEOUS
  Administered 2020-11-09 – 2020-11-12 (×7): 2 [IU] via SUBCUTANEOUS
  Administered 2020-11-12: 1 [IU] via SUBCUTANEOUS
  Administered 2020-11-13 (×2): 2 [IU] via SUBCUTANEOUS
  Administered 2020-11-14: 09:00:00 1 [IU] via SUBCUTANEOUS
  Administered 2020-11-14: 2 [IU] via SUBCUTANEOUS
  Administered 2020-11-14: 3 [IU] via SUBCUTANEOUS
  Administered 2020-11-15: 12:00:00 2 [IU] via SUBCUTANEOUS
  Administered 2020-11-15: 3 [IU] via SUBCUTANEOUS
  Administered 2020-11-15: 08:00:00 1 [IU] via SUBCUTANEOUS
  Administered 2020-11-16 – 2020-11-17 (×4): 2 [IU] via SUBCUTANEOUS
  Administered 2020-11-18 (×2): 1 [IU] via SUBCUTANEOUS
  Administered 2020-11-19: 18:00:00 2 [IU] via SUBCUTANEOUS
  Administered 2020-11-20: 3 [IU] via SUBCUTANEOUS
  Administered 2020-11-20: 2 [IU] via SUBCUTANEOUS
  Filled 2020-11-07 (×26): qty 1

## 2020-11-07 MED ORDER — INSULIN DETEMIR 100 UNIT/ML ~~LOC~~ SOLN
12.0000 [IU] | Freq: Two times a day (BID) | SUBCUTANEOUS | Status: DC
  Administered 2020-11-07 (×2): 12 [IU] via SUBCUTANEOUS
  Filled 2020-11-07 (×4): qty 0.12

## 2020-11-07 NOTE — TOC Progression Note (Signed)
Transition of Care [facility]) - Progression Note    Patient Details  Name: Daisy Watkins MRN: [id number] Date of Birth: 16-May-1932  [facility] CM/SW Contact  Shelbie Hutching, RN Phone Number: 11/07/2020, 2:06 PM  Clinical Narrative:    TOC continues to work on discharge planning and placement.  Patient is on the difficult to place list.  RNCM faxed referral to [facility] today- they have memory care beds at several facilities.     Expected Discharge Plan: Assisted Living Barriers to Discharge: Family Issues, Other (must enter comment) (Medicaid application pending-no ALF bed)  Expected Discharge Plan and Services Expected Discharge Plan: Assisted Living   Discharge Planning Services: CM Consult   Living arrangements for the past 2 months: Single Family Home Expected Discharge Date: 10/03/20               DME Arranged: N/A DME Agency: NA       HH Arranged: NA           Social Determinants of Health (SDOH) Interventions    Readmission Risk Interventions No flowsheet data found.

## 2020-11-07 NOTE — Progress Notes (Signed)
PROGRESS NOTE    TERREE HALSELL  [id number] DOB: 1932-12-28 DOA: 09/30/2020 PCP: Valerie Roys, DO    Brief Narrative:  Daisy Watkins is a 87 y.o. F with HTN, diabetes, meningioma, A. fib/history DVT on Eliquis, and CAD who presented with agitation.   Medical stable and waiting for placement.  Unsafe discharge.  See interim discharge summary from 10/03/20  Subjective. Patient was seen and examined today.  No new complaints.  She was feeling hungry and asking to be fed.  Per nursing staff she did ate her breakfast.  Assessment & Plan:   History of DVT and paroxysmal atrial fibrillation - Continue Eliquis  Dementia - Continue Lexapro and Depakote - Continue melatonin  Diabetes.  Had another hypoglycemic event. -Decrease Levemir to 12 units twice daily from 15. -Switch her to sensitive SSI  Hypertension Blood pressure ocntorlled - Continue ramipril   DVT prophylaxis:  Eliquis Code Status: full Family Communication:  Disposition Plan:      Status is: Inpatient   Remains inpatient appropriate because:Unsafe d/c plan   Dispo: The patient is from: Home              Anticipated d/c is to: ALF              Patient currently is medically stable to d/c.              Difficult to place patient Yes  Consultants:  None  Procedures: None  Antimicrobials: None   Objective: Vitals:   11/06/20 2048 11/07/20 0446 11/07/20 0746 11/07/20 1228  BP: (!) 119/53 (!) 118/59 (!) 136/52 (!) 128/58  Pulse: (!) 56 (!) 51 (!) 57 (!) 55  Resp: '16 16 18 '$ (!) 22  Temp:  98.4 F (36.9 C) 98.7 F (37.1 C) 98.3 F (36.8 C)  TempSrc: Oral Oral Oral   SpO2: 96% 99% 97% 96%  Weight:      Height:        Intake/Output Summary (Last 24 hours) at 11/07/2020 1314 Last data filed at 11/07/2020 0446 Gross per 24 hour  Intake 237 ml  Output 925 ml  Net -688 ml     Filed Weights   10/11/20 0601 10/23/20 1409 11/05/20 1401  Weight: 75.1 kg 76.1 kg 78.3 kg    Examination:  General.   Well-developed elderly lady, in no acute distress. Pulmonary.  Lungs clear bilaterally, normal respiratory effort. CV.  Regular rate and rhythm, no JVD, rub or murmur. Abdomen.  Soft, nontender, nondistended, BS positive. CNS.  Alert and oriented to self, no apparent neurologic deficit. Extremities.  No edema, no cyanosis, pulses intact and symmetrical. Psychiatry.  Judgment and insight appears impaired.   Data Reviewed: I have personally reviewed following labs and imaging studies  CBC: Recent Labs  Lab 11/01/20 0429 11/07/20 0621  WBC 5.5 4.9  HGB 11.7* 12.0  HCT 36.1 36.9  MCV 90.7 91.6  PLT 154 147*    Basic Metabolic Panel: Recent Labs  Lab 11/07/20 0621  NA 142  K 4.2  CL 105  CO2 29  GLUCOSE 70  BUN 23  CREATININE 0.69  CALCIUM 9.3   GFR: Estimated Creatinine Clearance: 53.3 mL/min (by C-G formula based on SCr of 0.69 mg/dL). Liver Function Tests: No results for input(s): AST, ALT, ALKPHOS, BILITOT, PROT, ALBUMIN in the last 168 hours. No results for input(s): LIPASE, AMYLASE in the last 168 hours. No results for input(s): AMMONIA in the last 168 hours. Coagulation Profile: No results for input(s):  INR, PROTIME in the last 168 hours. Cardiac Enzymes: No results for input(s): CKTOTAL, CKMB, CKMBINDEX, TROPONINI in the last 168 hours. BNP (last 3 results) No results for input(s): PROBNP in the last 8760 hours. HbA1C: No results for input(s): HGBA1C in the last 72 hours. CBG: Recent Labs  Lab 11/06/20 1613 11/06/20 1648 11/06/20 2156 11/07/20 0748 11/07/20 1148  GLUCAP 69* 85 168* 77 103*    Lipid Profile: No results for input(s): CHOL, HDL, LDLCALC, TRIG, CHOLHDL, LDLDIRECT in the last 72 hours. Thyroid Function Tests: No results for input(s): TSH, T4TOTAL, FREET4, T3FREE, THYROIDAB in the last 72 hours. Anemia Panel: No results for input(s): VITAMINB12, FOLATE, FERRITIN, TIBC, IRON, RETICCTPCT in the last 72 hours. Sepsis Labs: No results for  input(s): PROCALCITON, LATICACIDVEN in the last 168 hours.  No results found for this or any previous visit (from the past 240 hour(s)).    Radiology Studies: No results found.  Scheduled Meds:  apixaban  5 mg Oral BID   divalproex  1,000 mg Oral Daily   escitalopram  20 mg Oral Daily   feeding supplement (GLUCERNA SHAKE)  237 mL Oral TID BM   insulin aspart  0-5 Units Subcutaneous QHS   insulin aspart  0-9 Units Subcutaneous TID WC   insulin detemir  12 Units Subcutaneous BID   ketotifen  1 drop Both Eyes TID   melatonin  5 mg Oral QHS   multivitamin with minerals  1 tablet Oral Daily   polyethylene glycol  17 g Oral Daily   ramipril  5 mg Oral Daily   senna-docusate  1 tablet Oral QHS   thiamine  100 mg Oral Daily   vitamin B-12  1,000 mcg Oral Daily     LOS: 37 days    Time spent: 25 minutes. This record has been created using Systems analyst. Errors have been sought and corrected,but may not always be located. Such creation errors do not reflect on the standard of care.    Lorella Nimrod, MD [facility]    11/07/2020, 1:14 PM

## 2020-11-08 ENCOUNTER — Ambulatory Visit: Payer: Medicare HMO

## 2020-11-08 DIAGNOSIS — G9341 Metabolic encephalopathy: Secondary | ICD-10-CM | POA: Diagnosis not present

## 2020-11-08 LAB — GLUCOSE, CAPILLARY
Glucose-Capillary: 71 mg/dL (ref 70–99)
Glucose-Capillary: 122 mg/dL — ABNORMAL HIGH (ref 70–99)
Glucose-Capillary: 144 mg/dL — ABNORMAL HIGH (ref 70–99)
Glucose-Capillary: 158 mg/dL — ABNORMAL HIGH (ref 70–99)

## 2020-11-08 MED ORDER — INSULIN DETEMIR 100 UNIT/ML ~~LOC~~ SOLN
10.0000 [IU] | Freq: Two times a day (BID) | SUBCUTANEOUS | Status: DC
  Administered 2020-11-08 – 2020-11-20 (×25): 10 [IU] via SUBCUTANEOUS
  Filled 2020-11-08 (×26): qty 0.1

## 2020-11-08 NOTE — TOC Progression Note (Signed)
Transition of Care [facility]) - Progression Note    Patient Details  Name: Daisy Watkins MRN: [id number] Date of Birth: 10/13/1932  [facility]) CM/SW Contact  Shelbie Hutching, RN Phone Number: 11/08/2020, 5:26 PM  Clinical Narrative:    Ebony Hail with [facility] came to see patient today.  Referral given for the Memory Care there.  Ebony Hail is optimistic that they can offer a bed pending her Medicaid pending status being verified.      Expected Discharge Plan: Assisted Living Barriers to Discharge: Family Issues, Other (must enter comment) (Medicaid application pending-no ALF bed)  Expected Discharge Plan and Services Expected Discharge Plan: Assisted Living   Discharge Planning Services: CM Consult   Living arrangements for the past 2 months: Single Family Home Expected Discharge Date: 10/03/20               DME Arranged: N/A DME Agency: NA       HH Arranged: NA           Social Determinants of Health (SDOH) Interventions    Readmission Risk Interventions No flowsheet data found.

## 2020-11-08 NOTE — Progress Notes (Addendum)
PROGRESS NOTE    Daisy Watkins  [id number] DOB: [birth year] DOA: 09/30/2020 PCP: Daisy Roys, DO    Brief Narrative:  Daisy Watkins is a 87 y.o. F with HTN, diabetes, meningioma, A. fib/history DVT on Eliquis, and CAD who presented with agitation.   Medical stable and waiting for placement.  Unsafe discharge.  Will need memory care unit.  See interim discharge summary from 10/03/20  Subjective. Patient was seen and examined today.  No new complaints.  Appears at baseline. Waiting for placement.  Assessment & Plan:   History of DVT and paroxysmal atrial fibrillation - Continue Eliquis  Dementia - Continue Lexapro and Depakote - Continue melatonin  Diabetes.  CBG remained borderline -Decrease Levemir to 10 units twice daily from 15. -Continue with sensitive SSI  Hypertension Blood pressure ocntorlled - Continue ramipril   DVT prophylaxis:  Eliquis Code Status: full Family Communication:  Disposition Plan:      Status is: Inpatient   Remains inpatient appropriate because:Unsafe d/c plan   Dispo: The patient is from: Home              Anticipated d/c is to: ALF              Patient currently is medically stable to d/c.              Difficult to place patient Yes  Consultants:  None  Procedures: None  Antimicrobials: None   Objective: Vitals:   11/08/20 0759 11/08/20 0839 11/08/20 1229 11/08/20 1558  BP: 133/67  (!) 124/50 (!) 119/44  Pulse: 61 60 94 (!) 56  Resp: '16  16 18  '$ Temp: 98 F (36.7 C)  98.5 F (36.9 C) 98.5 F (36.9 C)  TempSrc:      SpO2: 95%  93% 93%  Weight:      Height:        Intake/Output Summary (Last 24 hours) at 11/08/2020 1606 Last data filed at 11/08/2020 1429 Gross per 24 hour  Intake --  Output 400 ml  Net -400 ml      Filed Weights   10/11/20 0601 10/23/20 1409 11/05/20 1401  Weight: 75.1 kg 76.1 kg 78.3 kg    Examination:  General.  Well-developed elderly lady, in no acute distress. Pulmonary.  Lungs clear  bilaterally, normal respiratory effort. CV.  Regular rate and rhythm, no JVD, rub or murmur. Abdomen.  Soft, nontender, nondistended, BS positive. CNS.  Alert and oriented to self, no apparent neurologic deficit. Extremities.  No edema, no cyanosis, pulses intact and symmetrical. Psychiatry.  Judgment and insight appears impaired.   Data Reviewed: I have personally reviewed following labs and imaging studies  CBC: Recent Labs  Lab 11/07/20 0621  WBC 4.9  HGB 12.0  HCT 36.9  MCV 91.6  PLT 147*    Basic Metabolic Panel: Recent Labs  Lab 11/07/20 0621  NA 142  K 4.2  CL 105  CO2 29  GLUCOSE 70  BUN 23  CREATININE 0.69  CALCIUM 9.3    GFR: Estimated Creatinine Clearance: 53.3 mL/min (by C-G formula based on SCr of 0.69 mg/dL). Liver Function Tests: No results for input(s): AST, ALT, ALKPHOS, BILITOT, PROT, ALBUMIN in the last 168 hours. No results for input(s): LIPASE, AMYLASE in the last 168 hours. No results for input(s): AMMONIA in the last 168 hours. Coagulation Profile: No results for input(s): INR, PROTIME in the last 168 hours. Cardiac Enzymes: No results for input(s): CKTOTAL, CKMB, CKMBINDEX, TROPONINI in the last  168 hours. BNP (last 3 results) No results for input(s): PROBNP in the last 8760 hours. HbA1C: No results for input(s): HGBA1C in the last 72 hours. CBG: Recent Labs  Lab 11/07/20 1537 11/07/20 2102 11/08/20 0800 11/08/20 1228 11/08/20 1549  GLUCAP 68* 170* 71 122* 144*    Lipid Profile: No results for input(s): CHOL, HDL, LDLCALC, TRIG, CHOLHDL, LDLDIRECT in the last 72 hours. Thyroid Function Tests: No results for input(s): TSH, T4TOTAL, FREET4, T3FREE, THYROIDAB in the last 72 hours. Anemia Panel: No results for input(s): VITAMINB12, FOLATE, FERRITIN, TIBC, IRON, RETICCTPCT in the last 72 hours. Sepsis Labs: No results for input(s): PROCALCITON, LATICACIDVEN in the last 168 hours.  No results found for this or any previous visit  (from the past 240 hour(s)).    Radiology Studies: No results found.  Scheduled Meds:  apixaban  5 mg Oral BID   divalproex  1,000 mg Oral Daily   escitalopram  20 mg Oral Daily   feeding supplement (GLUCERNA SHAKE)  237 mL Oral TID BM   insulin aspart  0-5 Units Subcutaneous QHS   insulin aspart  0-9 Units Subcutaneous TID WC   insulin detemir  10 Units Subcutaneous BID   ketotifen  1 drop Both Eyes TID   melatonin  5 mg Oral QHS   multivitamin with minerals  1 tablet Oral Daily   polyethylene glycol  17 g Oral Daily   ramipril  5 mg Oral Daily   senna-docusate  1 tablet Oral QHS   thiamine  100 mg Oral Daily   vitamin B-12  1,000 mcg Oral Daily     LOS: 38 days    Time spent: 22 minutes. This record has been created using Systems analyst. Errors have been sought and corrected,but may not always be located. Such creation errors do not reflect on the standard of care.    Daisy Nimrod, MD [facility]    11/08/2020, 4:06 PM

## 2020-11-09 ENCOUNTER — Ambulatory Visit: Payer: Medicare HMO

## 2020-11-09 DIAGNOSIS — G9341 Metabolic encephalopathy: Secondary | ICD-10-CM | POA: Diagnosis not present

## 2020-11-09 LAB — GLUCOSE, CAPILLARY
Glucose-Capillary: 139 mg/dL — ABNORMAL HIGH (ref 70–99)
Glucose-Capillary: 175 mg/dL — ABNORMAL HIGH (ref 70–99)
Glucose-Capillary: 158 mg/dL — ABNORMAL HIGH (ref 70–99)
Glucose-Capillary: 139 mg/dL — ABNORMAL HIGH (ref 70–99)

## 2020-11-09 MED ORDER — TRAZODONE HCL 50 MG PO TABS
25.0000 mg | ORAL_TABLET | Freq: Every evening | ORAL | Status: DC | PRN
  Administered 2020-11-10 – 2020-11-19 (×7): 25 mg via ORAL
  Filled 2020-11-09 (×7): qty 1

## 2020-11-09 MED ORDER — LORAZEPAM 0.5 MG PO TABS
0.5000 mg | ORAL_TABLET | Freq: Two times a day (BID) | ORAL | Status: DC | PRN
  Administered 2020-11-09 – 2020-11-13 (×4): 0.5 mg via ORAL
  Filled 2020-11-09 (×4): qty 1

## 2020-11-09 NOTE — Progress Notes (Signed)
Physical Therapy Treatment Patient Details Name: Daisy Watkins MRN: [id number] DOB: [birth year] Today's Date: 11/09/2020    History of Present Illness 87 y/o admitted on 09/30/20 with c/c of AMS with confusion and agitation. Pt recently d/c on 09/15/20 after being admitted for change in mental status. PMH: anxiety, DVT, DM, GERD, insomnia, HTN, HLD, insomnia, myalgia.    PT Comments    Pt was initially unwilling to participate with PT,but with continuous extensive encouragement and with CNA in the room she was willing to participate. Pt performed all functional mobility min guard-mod A with frequent verbal cueing for sequencing. Pt had most difficulty with bed mobility and transfers requiring mod A  and frequent VC for sequencing for both activities. Pt able to walk to chair and was overall steady with no LOB but declined further ambulation despite extensive encouragement. Will continue to keep on PT caseload in acute care to minimize functional decline.    Follow Up Recommendations  Supervision/Assistance - 24 hour;Supervision for mobility/OOB     Equipment Recommendations  Other (comment) (TBD in next venue)    Recommendations for Other Services       Precautions / Restrictions Precautions Precautions: Fall Precaution Comments: dementia Restrictions Weight Bearing Restrictions: No    Mobility  Bed Mobility Overal bed mobility: Needs Assistance Bed Mobility: Supine to Sit;Rolling Rolling: Mod assist   Supine to sit: Mod assist;HOB elevated     General bed mobility comments: Mod assist during sup>sit for LEs and trunk.Frequent verbal cues for seuqencing    Transfers Overall transfer level: Needs assistance Equipment used: Rolling walker (2 wheeled) Transfers: Sit to/from Stand Sit to Stand: From elevated surface;Mod assist         General transfer comment: Frequent verbal cues for seuqencing; demonstrated poor eccentric lowering to the  chair  Ambulation/Gait Ambulation/Gait assistance: Min guard Gait Distance (Feet): 3 Feet Assistive device: Rolling walker (2 wheeled) Gait Pattern/deviations: Step-to pattern;Decreased stride length Gait velocity: decreased   General Gait Details: Frequent verbal cues for sequencing and walker placement. Pt walked very slowly to chair but was overall steady with no LOB.   Stairs             Wheelchair Mobility    Modified Rankin (Stroke Patients Only)       Balance Overall balance assessment: Needs assistance Sitting-balance support: Feet supported;Bilateral upper extremity supported Sitting balance-Leahy Scale: Good Sitting balance - Comments: Supervision at EOB   Standing balance support: Bilateral upper extremity supported;During functional activity Standing balance-Leahy Scale: Fair Standing balance comment: Reliance on UE support through RW.                            Cognition Arousal/Alertness: Awake/alert Behavior During Therapy: WFL for tasks assessed/performed Overall Cognitive Status: History of cognitive impairments - at baseline                                        Exercises Other Exercises Other Exercises: Static sitting 1-69mn with supervision for improved trunk control and balance.    General Comments        Pertinent Vitals/Pain Pain Assessment: No/denies pain    Home Living                      Prior Function  PT Goals (current goals can now be found in the care plan section) Progress towards PT goals: Progressing toward goals    Frequency    Min 2X/week      PT Plan Current plan remains appropriate    Co-evaluation              AM-PAC PT "6 Clicks" Mobility   Outcome Measure  Help needed turning from your back to your side while in a flat bed without using bedrails?: A Lot Help needed moving from lying on your back to sitting on the side of a flat bed without  using bedrails?: A Lot Help needed moving to and from a bed to a chair (including a wheelchair)?: A Lot Help needed standing up from a chair using your arms (e.g., wheelchair or bedside chair)?: A Lot Help needed to walk in hospital room?: A Little Help needed climbing 3-5 steps with a railing? : A Lot 6 Click Score: 13    End of Session Equipment Utilized During Treatment: Gait belt Activity Tolerance: Other (comment) (Self limiting; required extensive encouragement to participate) Patient left: in chair;with chair alarm set;with call bell/phone within reach (heels floating via pillow) Nurse Communication: Mobility status PT Visit Diagnosis: Unsteadiness on feet (R26.81);Muscle weakness (generalized) (M62.81)     Time: [id number] PT Time Calculation (min) (ACUTE ONLY): 26 min  Charges:                        Dayton Scrape SPT 11/09/20, 3:34 PM

## 2020-11-09 NOTE — Progress Notes (Signed)
PROGRESS NOTE    Daisy Watkins  [id number] DOB: June 03, 1932 DOA: 09/30/2020 PCP: Daisy Roys, DO    Brief Narrative:  Daisy Watkins is a 87 y.o. F with HTN, diabetes, meningioma, A. fib/history DVT on Eliquis, and CAD who presented with agitation.   Medical stable and waiting for placement.  Unsafe discharge.  Will need memory care unit.  See interim discharge summary from 10/03/20  Subjective. Patient was resting comfortably when seen today.  No complaints.  She was asking for some more food.  Assessment & Plan:   History of DVT and paroxysmal atrial fibrillation - Continue Eliquis  Dementia - Continue Lexapro and Depakote - Continue melatonin  Diabetes.  CBG remained borderline -Decrease Levemir to 10 units twice daily from 15. -Continue with sensitive SSI  Hypertension Blood pressure ocntorlled - Continue ramipril   DVT prophylaxis:  Eliquis Code Status: full Family Communication:  Disposition Plan:      Status is: Inpatient   Remains inpatient appropriate because:Unsafe d/c plan   Dispo: The patient is from: Home              Anticipated d/c is to: ALF              Patient currently is medically stable to d/c.              Difficult to place patient Yes  Consultants:  None  Procedures: None  Antimicrobials: None   Objective: Vitals:   11/08/20 2024 11/09/20 0608 11/09/20 0821 11/09/20 1157  BP: (!) 141/56 119/63 (!) 120/50 (!) 120/54  Pulse: (!) 53 (!) 47 (!) 53 (!) 52  Resp:  '16 16 18  '$ Temp: 98.3 F (36.8 C) 97.7 F (36.5 C) 98.2 F (36.8 C) (!) 97.5 F (36.4 C)  TempSrc:  Oral    SpO2: 97% 97% 93% 95%  Weight:      Height:        Intake/Output Summary (Last 24 hours) at 11/09/2020 1600 Last data filed at 11/09/2020 0610 Gross per 24 hour  Intake --  Output 500 ml  Net -500 ml      Filed Weights   10/11/20 0601 10/23/20 1409 11/05/20 1401  Weight: 75.1 kg 76.1 kg 78.3 kg    Examination:  General.  Elderly lady, In no acute  distress. Pulmonary.  Lungs clear bilaterally, normal respiratory effort. CV.  Regular rate and rhythm, no JVD, rub or murmur. Abdomen.  Soft, nontender, nondistended, BS positive. CNS.  Alert and oriented to self only.  No focal neurologic deficit. Extremities.  No edema, no cyanosis, pulses intact and symmetrical. Psychiatry.  Judgment and insight appears impaired.  Data Reviewed: I have personally reviewed following labs and imaging studies  CBC: Recent Labs  Lab 11/07/20 0621  WBC 4.9  HGB 12.0  HCT 36.9  MCV 91.6  PLT 147*    Basic Metabolic Panel: Recent Labs  Lab 11/07/20 0621  NA 142  K 4.2  CL 105  CO2 29  GLUCOSE 70  BUN 23  CREATININE 0.69  CALCIUM 9.3    GFR: Estimated Creatinine Clearance: 53.3 mL/min (by C-G formula based on SCr of 0.69 mg/dL). Liver Function Tests: No results for input(s): AST, ALT, ALKPHOS, BILITOT, PROT, ALBUMIN in the last 168 hours. No results for input(s): LIPASE, AMYLASE in the last 168 hours. No results for input(s): AMMONIA in the last 168 hours. Coagulation Profile: No results for input(s): INR, PROTIME in the last 168 hours. Cardiac Enzymes: No results  for input(s): CKTOTAL, CKMB, CKMBINDEX, TROPONINI in the last 168 hours. BNP (last 3 results) No results for input(s): PROBNP in the last 8760 hours. HbA1C: No results for input(s): HGBA1C in the last 72 hours. CBG: Recent Labs  Lab 11/08/20 1228 11/08/20 1549 11/08/20 2025 11/09/20 0800 11/09/20 1146  GLUCAP 122* 144* 158* 139* 175*    Lipid Profile: No results for input(s): CHOL, HDL, LDLCALC, TRIG, CHOLHDL, LDLDIRECT in the last 72 hours. Thyroid Function Tests: No results for input(s): TSH, T4TOTAL, FREET4, T3FREE, THYROIDAB in the last 72 hours. Anemia Panel: No results for input(s): VITAMINB12, FOLATE, FERRITIN, TIBC, IRON, RETICCTPCT in the last 72 hours. Sepsis Labs: No results for input(s): PROCALCITON, LATICACIDVEN in the last 168 hours.  No results  found for this or any previous visit (from the past 240 hour(s)).    Radiology Studies: No results found.  Scheduled Meds:  apixaban  5 mg Oral BID   divalproex  1,000 mg Oral Daily   escitalopram  20 mg Oral Daily   feeding supplement (GLUCERNA SHAKE)  237 mL Oral TID BM   insulin aspart  0-5 Units Subcutaneous QHS   insulin aspart  0-9 Units Subcutaneous TID WC   insulin detemir  10 Units Subcutaneous BID   ketotifen  1 drop Both Eyes TID   melatonin  5 mg Oral QHS   multivitamin with minerals  1 tablet Oral Daily   polyethylene glycol  17 g Oral Daily   ramipril  5 mg Oral Daily   senna-docusate  1 tablet Oral QHS   thiamine  100 mg Oral Daily   vitamin B-12  1,000 mcg Oral Daily     LOS: 39 days    Time spent: 22 minutes. This record has been created using Systems analyst. Errors have been sought and corrected,but may not always be located. Such creation errors do not reflect on the standard of care.    Daisy Nimrod, MD [facility]    11/09/2020, 4:00 PM

## 2020-11-10 DIAGNOSIS — G9341 Metabolic encephalopathy: Secondary | ICD-10-CM | POA: Diagnosis not present

## 2020-11-10 LAB — GLUCOSE, CAPILLARY
Glucose-Capillary: 83 mg/dL (ref 70–99)
Glucose-Capillary: 159 mg/dL — ABNORMAL HIGH (ref 70–99)
Glucose-Capillary: 167 mg/dL — ABNORMAL HIGH (ref 70–99)
Glucose-Capillary: 270 mg/dL — ABNORMAL HIGH (ref 70–99)

## 2020-11-10 NOTE — Progress Notes (Signed)
PROGRESS NOTE    Daisy Watkins  [id number] DOB: October 28, 1932 DOA: 09/30/2020 PCP: Valerie Roys, DO    Brief Narrative:  Daisy Watkins is a 87 y.o. F with HTN, diabetes, meningioma, A. fib/history DVT on Eliquis, and CAD who presented with agitation.   Medical stable and waiting for placement.  Unsafe discharge.  Will need memory care unit.  See interim discharge summary from 10/03/20  Subjective. Patient was seen and examined today.  Drinking Glucerna.  No complaints.  Assessment & Plan:   History of DVT and paroxysmal atrial fibrillation - Continue Eliquis  Dementia - Continue Lexapro and Depakote - Continue melatonin  Diabetes.  CBG remained borderline -Decrease Levemir to 10 units twice daily from 15. -Continue with sensitive SSI  Hypertension Blood pressure ocntorlled - Continue ramipril   DVT prophylaxis:  Eliquis Code Status: full Family Communication:  Disposition Plan:      Status is: Inpatient   Remains inpatient appropriate because:Unsafe d/c plan   Dispo: The patient is from: Home              Anticipated d/c is to: ALF              Patient currently is medically stable to d/c.              Difficult to place patient Yes  Consultants:  None  Procedures: None  Antimicrobials: None   Objective: Vitals:   11/10/20 0554 11/10/20 0755 11/10/20 1133 11/10/20 1541  BP: 117/63 (!) 105/55 (!) 119/44 (!) 124/48  Pulse: (!) 51 (!) 50 (!) 51 (!) 46  Resp: '16 16 16 16  '$ Temp: 97.8 F (36.6 C) 98.3 F (36.8 C) 98 F (36.7 C) 98.1 F (36.7 C)  TempSrc:      SpO2: 97% 98% 98% 96%  Weight:      Height:        Intake/Output Summary (Last 24 hours) at 11/10/2020 1653 Last data filed at 11/10/2020 0630 Gross per 24 hour  Intake --  Output 900 ml  Net -900 ml      Filed Weights   10/11/20 0601 10/23/20 1409 11/05/20 1401  Weight: 75.1 kg 76.1 kg 78.3 kg    Examination:  General.  Elderly lady, In no acute distress. Pulmonary.  Lungs clear  bilaterally, normal respiratory effort. CV.  Regular rate and rhythm, no JVD, rub or murmur. Abdomen.  Soft, nontender, nondistended, BS positive. CNS.  Alert and oriented to self only.  No focal neurologic deficit. Extremities.  No edema, no cyanosis, pulses intact and symmetrical. Psychiatry.  Judgment and insight appears impaired.  Data Reviewed: I have personally reviewed following labs and imaging studies  CBC: Recent Labs  Lab 11/07/20 0621  WBC 4.9  HGB 12.0  HCT 36.9  MCV 91.6  PLT 147*    Basic Metabolic Panel: Recent Labs  Lab 11/07/20 0621  NA 142  K 4.2  CL 105  CO2 29  GLUCOSE 70  BUN 23  CREATININE 0.69  CALCIUM 9.3    GFR: Estimated Creatinine Clearance: 53.3 mL/min (by C-G formula based on SCr of 0.69 mg/dL). Liver Function Tests: No results for input(s): AST, ALT, ALKPHOS, BILITOT, PROT, ALBUMIN in the last 168 hours. No results for input(s): LIPASE, AMYLASE in the last 168 hours. No results for input(s): AMMONIA in the last 168 hours. Coagulation Profile: No results for input(s): INR, PROTIME in the last 168 hours. Cardiac Enzymes: No results for input(s): CKTOTAL, CKMB, CKMBINDEX, TROPONINI in  the last 168 hours. BNP (last 3 results) No results for input(s): PROBNP in the last 8760 hours. HbA1C: No results for input(s): HGBA1C in the last 72 hours. CBG: Recent Labs  Lab 11/09/20 1643 11/09/20 2045 11/10/20 0755 11/10/20 1134 11/10/20 1542  GLUCAP 158* 139* 83 159* 167*    Lipid Profile: No results for input(s): CHOL, HDL, LDLCALC, TRIG, CHOLHDL, LDLDIRECT in the last 72 hours. Thyroid Function Tests: No results for input(s): TSH, T4TOTAL, FREET4, T3FREE, THYROIDAB in the last 72 hours. Anemia Panel: No results for input(s): VITAMINB12, FOLATE, FERRITIN, TIBC, IRON, RETICCTPCT in the last 72 hours. Sepsis Labs: No results for input(s): PROCALCITON, LATICACIDVEN in the last 168 hours.  No results found for this or any previous visit  (from the past 240 hour(s)).    Radiology Studies: No results found.  Scheduled Meds:  apixaban  5 mg Oral BID   divalproex  1,000 mg Oral Daily   escitalopram  20 mg Oral Daily   feeding supplement (GLUCERNA SHAKE)  237 mL Oral TID BM   insulin aspart  0-5 Units Subcutaneous QHS   insulin aspart  0-9 Units Subcutaneous TID WC   insulin detemir  10 Units Subcutaneous BID   ketotifen  1 drop Both Eyes TID   melatonin  5 mg Oral QHS   multivitamin with minerals  1 tablet Oral Daily   polyethylene glycol  17 g Oral Daily   ramipril  5 mg Oral Daily   senna-docusate  1 tablet Oral QHS   thiamine  100 mg Oral Daily   vitamin B-12  1,000 mcg Oral Daily     LOS: 40 days    Time spent: 22 minutes. This record has been created using Systems analyst. Errors have been sought and corrected,but may not always be located. Such creation errors do not reflect on the standard of care.    Lorella Nimrod, MD [facility]    11/10/2020, 4:53 PM

## 2020-11-11 DIAGNOSIS — G9341 Metabolic encephalopathy: Secondary | ICD-10-CM | POA: Diagnosis not present

## 2020-11-11 LAB — CBC
HCT: 37.7 % (ref 36.0–46.0)
Hemoglobin: 12.3 g/dL (ref 12.0–15.0)
MCH: 29.7 pg (ref 26.0–34.0)
MCHC: 32.6 g/dL (ref 30.0–36.0)
MCV: 91.1 fL (ref 80.0–100.0)
Platelets: 136 10*3/uL — ABNORMAL LOW (ref 150–400)
RBC: 4.14 MIL/uL (ref 3.87–5.11)
RDW: 13.8 % (ref 11.5–15.5)
WBC: 4.4 10*3/uL (ref 4.0–10.5)
nRBC: 0 % (ref 0.0–0.2)

## 2020-11-11 LAB — GLUCOSE, CAPILLARY
Glucose-Capillary: 111 mg/dL — ABNORMAL HIGH (ref 70–99)
Glucose-Capillary: 187 mg/dL — ABNORMAL HIGH (ref 70–99)
Glucose-Capillary: 156 mg/dL — ABNORMAL HIGH (ref 70–99)
Glucose-Capillary: 182 mg/dL — ABNORMAL HIGH (ref 70–99)

## 2020-11-11 NOTE — Plan of Care (Signed)
We tried to get pt up to [facility] since she purewick uncomfortable.  She started to shake uncontrollably.  We eased her back onto the bed.  The shaking stopped but her eyes were closed.  I asked if she could hear me and she nodded her head.

## 2020-11-11 NOTE — Progress Notes (Signed)
PROGRESS NOTE    Daisy Watkins  [id number] DOB: January 25, 1933 DOA: 09/30/2020 PCP: Valerie Roys, DO    Brief Narrative:  Mrs. Sodders is a 87 y.o. F with HTN, diabetes, meningioma, A. fib/history DVT on Eliquis, and CAD who presented with agitation.   Medical stable and waiting for placement.  Unsafe discharge.  Will need memory care unit.  See interim discharge summary from 10/03/20  Subjective. Patient was seen during eating breakfast this morning.  No new complaints.  Assessment & Plan:   History of DVT and paroxysmal atrial fibrillation - Continue Eliquis  Dementia - Continue Lexapro and Depakote - Continue melatonin  Diabetes.  CBG remained borderline -Decrease Levemir to 10 units twice daily from 15. -Continue with sensitive SSI  Hypertension Blood pressure ocntorlled - Continue ramipril   DVT prophylaxis:  Eliquis Code Status: full Family Communication:  Disposition Plan:      Status is: Inpatient   Remains inpatient appropriate because:Unsafe d/c plan   Dispo: The patient is from: Home              Anticipated d/c is to: ALF              Patient currently is medically stable to d/c.              Difficult to place patient Yes  Consultants:  None  Procedures: None  Antimicrobials: None   Objective: Vitals:   11/11/20 0533 11/11/20 0846 11/11/20 1143 11/11/20 1533  BP: 116/67 (!) 142/62 (!) 115/48 (!) 120/55  Pulse: 65 (!) 52 64 (!) 47  Resp: '16 18 19 16  '$ Temp: 97.9 F (36.6 C) 98.4 F (36.9 C) 98.1 F (36.7 C) 98.1 F (36.7 C)  TempSrc: Oral     SpO2: 96% 98% 94% 98%  Weight:      Height:        Intake/Output Summary (Last 24 hours) at 11/11/2020 1535 Last data filed at 11/11/2020 0714 Gross per 24 hour  Intake --  Output 900 ml  Net -900 ml      Filed Weights   10/11/20 0601 10/23/20 1409 11/05/20 1401  Weight: 75.1 kg 76.1 kg 78.3 kg    Examination:  General.  Elderly lady, In no acute distress. Pulmonary.  Lungs clear  bilaterally, normal respiratory effort. CV.  Regular rate and rhythm, no JVD, rub or murmur. Abdomen.  Soft, nontender, nondistended, BS positive. CNS.  Alert and oriented to self only.  No focal neurologic deficit. Extremities.  No edema, no cyanosis, pulses intact and symmetrical. Psychiatry.  Judgment and insight appears impaired.  Data Reviewed: I have personally reviewed following labs and imaging studies  CBC: Recent Labs  Lab 11/07/20 0621 11/11/20 0537  WBC 4.9 4.4  HGB 12.0 12.3  HCT 36.9 37.7  MCV 91.6 91.1  PLT 147* 136*    Basic Metabolic Panel: Recent Labs  Lab 11/07/20 0621  NA 142  K 4.2  CL 105  CO2 29  GLUCOSE 70  BUN 23  CREATININE 0.69  CALCIUM 9.3    GFR: Estimated Creatinine Clearance: 53.3 mL/min (by C-G formula based on SCr of 0.69 mg/dL). Liver Function Tests: No results for input(s): AST, ALT, ALKPHOS, BILITOT, PROT, ALBUMIN in the last 168 hours. No results for input(s): LIPASE, AMYLASE in the last 168 hours. No results for input(s): AMMONIA in the last 168 hours. Coagulation Profile: No results for input(s): INR, PROTIME in the last 168 hours. Cardiac Enzymes: No results for input(s):  CKTOTAL, CKMB, CKMBINDEX, TROPONINI in the last 168 hours. BNP (last 3 results) No results for input(s): PROBNP in the last 8760 hours. HbA1C: No results for input(s): HGBA1C in the last 72 hours. CBG: Recent Labs  Lab 11/10/20 1542 11/10/20 2129 11/11/20 0846 11/11/20 1143 11/11/20 1534  GLUCAP 167* 270* 111* 187* 156*    Lipid Profile: No results for input(s): CHOL, HDL, LDLCALC, TRIG, CHOLHDL, LDLDIRECT in the last 72 hours. Thyroid Function Tests: No results for input(s): TSH, T4TOTAL, FREET4, T3FREE, THYROIDAB in the last 72 hours. Anemia Panel: No results for input(s): VITAMINB12, FOLATE, FERRITIN, TIBC, IRON, RETICCTPCT in the last 72 hours. Sepsis Labs: No results for input(s): PROCALCITON, LATICACIDVEN in the last 168 hours.  No  results found for this or any previous visit (from the past 240 hour(s)).    Radiology Studies: No results found.  Scheduled Meds:  apixaban  5 mg Oral BID   divalproex  1,000 mg Oral Daily   escitalopram  20 mg Oral Daily   feeding supplement (GLUCERNA SHAKE)  237 mL Oral TID BM   insulin aspart  0-5 Units Subcutaneous QHS   insulin aspart  0-9 Units Subcutaneous TID WC   insulin detemir  10 Units Subcutaneous BID   ketotifen  1 drop Both Eyes TID   melatonin  5 mg Oral QHS   multivitamin with minerals  1 tablet Oral Daily   polyethylene glycol  17 g Oral Daily   ramipril  5 mg Oral Daily   senna-docusate  1 tablet Oral QHS   thiamine  100 mg Oral Daily   vitamin B-12  1,000 mcg Oral Daily     LOS: 41 days    Time spent: 22 minutes. This record has been created using Systems analyst. Errors have been sought and corrected,but may not always be located. Such creation errors do not reflect on the standard of care.    Lorella Nimrod, MD [facility]    11/11/2020, 3:35 PM

## 2020-11-12 DIAGNOSIS — G9341 Metabolic encephalopathy: Secondary | ICD-10-CM | POA: Diagnosis not present

## 2020-11-12 LAB — GLUCOSE, CAPILLARY
Glucose-Capillary: 144 mg/dL — ABNORMAL HIGH (ref 70–99)
Glucose-Capillary: 199 mg/dL — ABNORMAL HIGH (ref 70–99)
Glucose-Capillary: 170 mg/dL — ABNORMAL HIGH (ref 70–99)
Glucose-Capillary: 194 mg/dL — ABNORMAL HIGH (ref 70–99)

## 2020-11-12 MED ORDER — BISACODYL 10 MG RE SUPP
10.0000 mg | Freq: Every day | RECTAL | Status: DC | PRN
  Administered 2020-11-12: 10 mg via RECTAL
  Filled 2020-11-12: qty 1

## 2020-11-12 NOTE — TOC Progression Note (Signed)
Transition of Care [facility]) - Progression Note    Patient Details  Name: Daisy Watkins MRN: [id number] Date of Birth: Dec 01, 1932  Transition of [facility]) CM/SW Contact  Shelbie Hutching, RN Phone Number: 11/12/2020, 3:50 PM  Clinical Narrative:    [facility] believes that the can accept the patient, when TOC followed up today they are still waiting to hear back from the business office on the [facility] Pending.    Expected Discharge Plan: Assisted Living Barriers to Discharge: Family Issues, Other (must enter comment) (Medicaid application pending-no ALF bed)  Expected Discharge Plan and Services Expected Discharge Plan: Assisted Living   Discharge Planning Services: CM Consult   Living arrangements for the past 2 months: Single Family Home Expected Discharge Date: 10/03/20               DME Arranged: N/A DME Agency: NA       HH Arranged: NA           Social Determinants of Health (SDOH) Interventions    Readmission Risk Interventions No flowsheet data found.

## 2020-11-12 NOTE — Progress Notes (Signed)
PROGRESS NOTE    [name]  [id number] DOB: 01-Oct-1932 DOA: 09/30/2020 PCP: Valerie Roys, DO    Brief Narrative:  [name] is a 87 y.o. F with HTN, diabetes, meningioma, A. fib/history DVT on Eliquis, and CAD who presented with agitation.   Medical stable and waiting for placement.  Unsafe discharge.  Will need memory care unit.  See interim discharge summary from 10/03/20  Subjective. Patient was seen and examined today.  She just finished working up with PT.  Feeling tired and wants to get some rest.  Assessment & Plan:   History of DVT and paroxysmal atrial fibrillation - Continue Eliquis  Dementia - Continue Lexapro and Depakote - Continue melatonin  Diabetes.  CBG remained borderline -Decrease Levemir to 10 units twice daily from 15. -Continue with sensitive SSI  Hypertension Blood pressure ocntorlled - Continue ramipril   DVT prophylaxis:  Eliquis Code Status: full Family Communication:  Disposition Plan:      Status is: Inpatient   Remains inpatient appropriate because:Unsafe d/c plan   Dispo: The patient is from: Home              Anticipated d/c is to: ALF              Patient currently is medically stable to d/c.              Difficult to place patient Yes  Consultants:  None  Procedures: None  Antimicrobials: None   Objective: Vitals:   11/11/20 2047 11/12/20 0550 11/12/20 0753 11/12/20 1147  BP: (!) 130/46 (!) 120/45 111/71 (!) 109/54  Pulse: (!) 56 (!) 55 (!) 53 60  Resp: '16 16 16 18  '$ Temp: 98 F (36.7 C) 97.6 F (36.4 C) 98 F (36.7 C) (!) 97.5 F (36.4 C)  TempSrc: Oral Oral    SpO2: 97% 97% 95% 99%  Weight:      Height:        Intake/Output Summary (Last 24 hours) at 11/12/2020 1643 Last data filed at 11/12/2020 0724 Gross per 24 hour  Intake --  Output 500 ml  Net -500 ml      Filed Weights   10/11/20 0601 10/23/20 1409 11/05/20 1401  Weight: 75.1 kg 76.1 kg 78.3 kg    Examination:  General.  Elderly  lady, In no acute distress. Pulmonary.  Lungs clear bilaterally, normal respiratory effort. CV.  Regular rate and rhythm, no JVD, rub or murmur. Abdomen.  Soft, nontender, nondistended, BS positive. CNS.  Alert and oriented to self only.  No focal neurologic deficit. Extremities.  No edema, no cyanosis, pulses intact and symmetrical. Psychiatry.  Judgment and insight appears impaired.  Data Reviewed: I have personally reviewed following labs and imaging studies  CBC: Recent Labs  Lab 11/07/20 0621 11/11/20 0537  WBC 4.9 4.4  HGB 12.0 12.3  HCT 36.9 37.7  MCV 91.6 91.1  PLT 147* 136*    Basic Metabolic Panel: Recent Labs  Lab 11/07/20 0621  NA 142  K 4.2  CL 105  CO2 29  GLUCOSE 70  BUN 23  CREATININE 0.69  CALCIUM 9.3    GFR: Estimated Creatinine Clearance: 53.3 mL/min (by C-G formula based on SCr of 0.69 mg/dL). Liver Function Tests: No results for input(s): AST, ALT, ALKPHOS, BILITOT, PROT, ALBUMIN in the last 168 hours. No results for input(s): LIPASE, AMYLASE in the last 168 hours. No results for input(s): AMMONIA in the last 168 hours. Coagulation Profile: No results for input(s):  INR, PROTIME in the last 168 hours. Cardiac Enzymes: No results for input(s): CKTOTAL, CKMB, CKMBINDEX, TROPONINI in the last 168 hours. BNP (last 3 results) No results for input(s): PROBNP in the last 8760 hours. HbA1C: No results for input(s): HGBA1C in the last 72 hours. CBG: Recent Labs  Lab 11/11/20 1143 11/11/20 1534 11/11/20 2048 11/12/20 0731 11/12/20 1159  GLUCAP 187* 156* 182* 144* 199*    Lipid Profile: No results for input(s): CHOL, HDL, LDLCALC, TRIG, CHOLHDL, LDLDIRECT in the last 72 hours. Thyroid Function Tests: No results for input(s): TSH, T4TOTAL, FREET4, T3FREE, THYROIDAB in the last 72 hours. Anemia Panel: No results for input(s): VITAMINB12, FOLATE, FERRITIN, TIBC, IRON, RETICCTPCT in the last 72 hours. Sepsis Labs: No results for input(s):  PROCALCITON, LATICACIDVEN in the last 168 hours.  No results found for this or any previous visit (from the past 240 hour(s)).    Radiology Studies: No results found.  Scheduled Meds:  apixaban  5 mg Oral BID   divalproex  1,000 mg Oral Daily   escitalopram  20 mg Oral Daily   feeding supplement (GLUCERNA SHAKE)  237 mL Oral TID BM   insulin aspart  0-5 Units Subcutaneous QHS   insulin aspart  0-9 Units Subcutaneous TID WC   insulin detemir  10 Units Subcutaneous BID   ketotifen  1 drop Both Eyes TID   melatonin  5 mg Oral QHS   multivitamin with minerals  1 tablet Oral Daily   polyethylene glycol  17 g Oral Daily   ramipril  5 mg Oral Daily   senna-docusate  1 tablet Oral QHS   thiamine  100 mg Oral Daily   vitamin B-12  1,000 mcg Oral Daily     LOS: 42 days    Time spent: 22 minutes. This record has been created using Systems analyst. Errors have been sought and corrected,but may not always be located. Such creation errors do not reflect on the standard of care.    Lorella Nimrod, MD [facility]    11/12/2020, 4:43 PM

## 2020-11-12 NOTE — Progress Notes (Signed)
Nutrition Follow-up  DOCUMENTATION CODES:  Not applicable  INTERVENTION:  Continue current diet as ordered  Glucerna Shake po TID, each supplement provides 220 kcal and 10 grams of protein Multivitamin with minerals daily Nursing to assist pt with meals New measured weight  NUTRITION DIAGNOSIS:  Inadequate oral intake related to poor appetite as evidenced by per patient/family report, meal completion < 50%.  GOAL:  Patient will meet greater than or equal to 90% of their needs  MONITOR:  PO intake, Supplement acceptance, Weight trends  REASON FOR ASSESSMENT:  Malnutrition Screening Tool    ASSESSMENT:  87 y.o. female brought to ED from home with AMS. Family reports that pt was agitated earlier in the day and then became unresponsive. PMH relevant for DM, GERD, HTN, HLD, Vitamin B12 and D deficiencies, and atrial fibrillation.  Pt resting in bed at the time of visit. Lunch present on tray table about ~50% consumed. States appetite is fine, continues to drink glucerna drinks. Pt requested ice cream, ok per RN.   Discussed in rounds, pt remains medically stable awaiting discharge, difficult placement.  Average Meal Intake: 7/3-7/5: 0% intake x 1 recorded meal 7/6-7/14: 15% intake x 23 recorded meals (0-100%) 7/15-7/20: 33% intake x 3 recorded meals (25-50%) 7/21-7/26: 50% intake x 2 recorded meals 7/27-8/2: 92% intake x 3 recorded meals (75-100%) 8/3-8/15: limited meals recorded, 28% intake x 4 recorded meals  Nutritionally Relevant Medications: Scheduled Meds:  feeding supplement (GLUCERNA SHAKE)  237 mL Oral TID BM   insulin aspart  0-15 Units Subcutaneous TID WC   insulin aspart  0-5 Units Subcutaneous QHS   insulin aspart  4 Units Subcutaneous TID WC   insulin detemir  16 Units Subcutaneous BID   multivitamin with minerals  1 tablet Oral Daily   polyethylene glycol  17 g Oral Daily   senna-docusate  1 tablet Oral QHS   thiamine  100 mg Oral Daily   vitamin B-12   1,000 mcg Oral Daily   PRN Meds: alum & mag hydroxide-simeth, mineral oil, ondansetron  Labs Reviewed: SBG ranges from 126-236 mg/dL over the last 24 hours HgbA1c 10.2% (6/17)  NUTRITION - FOCUSED PHYSICAL EXAM: Flowsheet Row Most Recent Value  Orbital Region No depletion  Upper Arm Region No depletion  Thoracic and Lumbar Region No depletion  Buccal Region No depletion  Temple Region No depletion  Clavicle Bone Region No depletion  Clavicle and Acromion Bone Region No depletion  Scapular Bone Region No depletion  Dorsal Hand No depletion  Patellar Region No depletion  Anterior Thigh Region No depletion  Posterior Calf Region No depletion  Edema (RD Assessment) Mild  Hair Reviewed  Eyes Reviewed  Mouth Reviewed  Skin Reviewed  Nails Reviewed   Diet Order:   Diet Order             Diet Carb Modified Fluid consistency: Thin; Room service appropriate? Yes  Diet effective now                  EDUCATION NEEDS:  No education needs have been identified at this time  Skin:  Skin Assessment: Reviewed RN Assessment  Last BM:  8/5 - discussed with RN, receiving scheduled bowel regimen. Enema ordered in prn medications, would benefit from administration.  Height:  Ht Readings from Last 1 Encounters:  09/30/20 '5\' 6"'$  (1.676 m)   Weight:  Wt Readings from Last 1 Encounters:  11/05/20 78.3 kg   Ideal Body Weight:  59.1 kg  BMI:  Body mass index is 27.86 kg/m.  Estimated Nutritional Needs:  Kcal:  1600-1800 kcal/d Protein:  80-90 g/d Fluid:  1.8-2L/d  Ranell Patrick, RD, LDN Clinical Dietitian Pager on [place]

## 2020-11-12 NOTE — Progress Notes (Signed)
Physical Therapy Treatment Patient Details Name: Daisy Watkins MRN: [id number] DOB: Oct 12, 1932 Today's Date: 11/12/2020    History of Present Illness 87 y/o admitted on 09/30/20 with c/c of AMS with confusion and agitation. Pt recently d/c on 09/15/20 after being admitted for change in mental status. PMH: anxiety, DVT, DM, GERD, insomnia, HTN, HLD, insomnia, myalgia.    PT Comments    Pt was lethargic upon arrival but was willing to participate during the session with extensive encouragement. Pt primarily kept eyes closed throughout session despite frequent multimodal cues but would respond with nods and rotating head. Pt required min-mod A +2 for bed mobility and frequent multimodal cues for sequencing and direction secondary to lethargy. Pt unable to perform transfers and ambulation secondary to lethargy and unwillingness to give effort. Pt will benefit from further OOB activities next visit as appropriate to address generalized weakness and fatigue. Pt will benefit from PT services in a SNF setting upon discharge to safely address deficits listed in patient problem list for decreased caregiver assistance and eventual return to PLOF.     Follow Up Recommendations  Supervision/Assistance - 24 hour;Supervision for mobility/OOB     Equipment Recommendations  Other (comment) (TBD in next venue)    Recommendations for Other Services       Precautions / Restrictions Precautions Precautions: Fall Precaution Comments: dementia Restrictions Weight Bearing Restrictions: No    Mobility  Bed Mobility Overal bed mobility: Needs Assistance Bed Mobility: Supine to Sit;Rolling;Sit to Supine Rolling: Min assist   Supine to sit: Mod assist;+2 for physical assistance Sit to supine: Min assist   General bed mobility comments: Mod assist +2 during supine>sit for LEs and trunk with pt actively pulling to the left side with bed rail to lay back down. Min A during sit>supine for LEs. Frequent  multimodal cues for sequencing. Verbal and tactile cues for sequencing during rolling.    Transfers                 General transfer comment: Attempted but pt too lethargic to give any effort  Ambulation/Gait                 Stairs             Wheelchair Mobility    Modified Rankin (Stroke Patients Only)       Balance Overall balance assessment: Needs assistance Sitting-balance support: Bilateral upper extremity supported;Feet unsupported Sitting balance-Leahy Scale: Poor Sitting balance - Comments: Mod A to prevent LOB leaning to left side       Standing balance comment: NT                            Cognition Arousal/Alertness: Lethargic Behavior During Therapy: Flat affect Overall Cognitive Status: History of cognitive impairments - at baseline                                        Exercises Total Joint Exercises Hip ABduction/ADduction: AAROM;Both;10 reps;Supine;Other (comment) (Therapist's hand under heel to reduce resistance) Straight Leg Raises: AAROM;Strengthening;Both;10 reps;Supine Other Exercises Other Exercises: Static sitting 3-73mn with min-mod A for improved trunk control and balance.    General Comments        Pertinent Vitals/Pain Pain Assessment: No/denies pain    Home Living  Prior Function            PT Goals (current goals can now be found in the care plan section) Progress towards PT goals: Progressing toward goals    Frequency    Min 2X/week      PT Plan Current plan remains appropriate    Co-evaluation              AM-PAC PT "6 Clicks" Mobility   Outcome Measure  Help needed turning from your back to your side while in a flat bed without using bedrails?: A Little Help needed moving from lying on your back to sitting on the side of a flat bed without using bedrails?: A Lot Help needed moving to and from a bed to a chair (including a  wheelchair)?: A Lot Help needed standing up from a chair using your arms (e.g., wheelchair or bedside chair)?: A Lot Help needed to walk in hospital room?: A Little Help needed climbing 3-5 steps with a railing? : A Lot 6 Click Score: 14    End of Session Equipment Utilized During Treatment: Gait belt Activity Tolerance: Patient limited by lethargy;Other (comment) (Self limiting) Patient left: in bed;with call bell/phone within reach;with bed alarm set Nurse Communication: Mobility status PT Visit Diagnosis: Unsteadiness on feet (R26.81);Muscle weakness (generalized) (M62.81)     Time: 1040-1105 PT Time Calculation (min) (ACUTE ONLY): 25 min  Charges:                        .Dayton Scrape SPT 11/12/20, 1:57 PM

## 2020-11-13 DIAGNOSIS — G9341 Metabolic encephalopathy: Secondary | ICD-10-CM | POA: Diagnosis not present

## 2020-11-13 LAB — GLUCOSE, CAPILLARY
Glucose-Capillary: 94 mg/dL (ref 70–99)
Glucose-Capillary: 173 mg/dL — ABNORMAL HIGH (ref 70–99)
Glucose-Capillary: 185 mg/dL — ABNORMAL HIGH (ref 70–99)
Glucose-Capillary: 180 mg/dL — ABNORMAL HIGH (ref 70–99)

## 2020-11-13 NOTE — Progress Notes (Signed)
PROGRESS NOTE    Daisy Watkins  [id number] DOB: [birth year] DOA: 09/30/2020 PCP: Valerie Roys, DO    Brief Narrative:  Mrs. Rossmiller is a 87 y.o. F with HTN, diabetes, meningioma, A. fib/history DVT on Eliquis, and CAD who presented with agitation.   Medical stable and waiting for placement.  Unsafe discharge.  Will need memory care unit.  See interim discharge summary from 10/03/20  Subjective. Patient was eating her lunch when seen today.  Denies any complaints. Earlier in the day she became transiently unresponsive when nursing staff was trying to move her from bed to chair, vitals remained stable and she regained consciousness within seconds.  Might be some vasovagal-we will continue to monitor.  Assessment & Plan:   History of DVT and paroxysmal atrial fibrillation - Continue Eliquis  Dementia - Continue Lexapro and Depakote - Continue melatonin  Diabetes.  CBG remained borderline -Decrease Levemir to 10 units twice daily from 15. -Continue with sensitive SSI  Hypertension Blood pressure ocntorlled - Continue ramipril   DVT prophylaxis:  Eliquis Code Status: full Family Communication:  Disposition Plan:      Status is: Inpatient   Remains inpatient appropriate because:Unsafe d/c plan   Dispo: The patient is from: Home              Anticipated d/c is to: ALF              Patient currently is medically stable to d/c.              Difficult to place patient Yes  Consultants:  None  Procedures: None  Antimicrobials: None   Objective: Vitals:   11/13/20 0757 11/13/20 0917 11/13/20 1137 11/13/20 1548  BP: 137/66 (!) 117/50 (!) 123/51 (!) 138/57  Pulse: (!) 47 (!) 52 (!) 51 (!) 50  Resp: '16 16 18 15  '$ Temp: 98 F (36.7 C)  98.3 F (36.8 C) 98.3 F (36.8 C)  TempSrc:   Oral Oral  SpO2: 97%  98%   Weight:      Height:        Intake/Output Summary (Last 24 hours) at 11/13/2020 1615 Last data filed at 11/13/2020 1300 Gross per 24 hour  Intake 0 ml   Output 300 ml  Net -300 ml      Filed Weights   10/11/20 0601 10/23/20 1409 11/05/20 1401  Weight: 75.1 kg 76.1 kg 78.3 kg    Examination:  General.  Frail elderly lady, in no acute distress. Pulmonary.  Lungs clear bilaterally, normal respiratory effort. CV.  Regular rate and rhythm, no JVD, rub or murmur. Abdomen.  Soft, nontender, nondistended, BS positive. CNS.  Alert and oriented to self only.  No focal neurologic deficit. Extremities.  No edema, no cyanosis, pulses intact and symmetrical. Psychiatry.  Judgment and insight appears impaired.   Data Reviewed: I have personally reviewed following labs and imaging studies  CBC: Recent Labs  Lab 11/07/20 0621 11/11/20 0537  WBC 4.9 4.4  HGB 12.0 12.3  HCT 36.9 37.7  MCV 91.6 91.1  PLT 147* 136*    Basic Metabolic Panel: Recent Labs  Lab 11/07/20 0621  NA 142  K 4.2  CL 105  CO2 29  GLUCOSE 70  BUN 23  CREATININE 0.69  CALCIUM 9.3    GFR: Estimated Creatinine Clearance: 53.3 mL/min (by C-G formula based on SCr of 0.69 mg/dL). Liver Function Tests: No results for input(s): AST, ALT, ALKPHOS, BILITOT, PROT, ALBUMIN in the last 168 hours.  No results for input(s): LIPASE, AMYLASE in the last 168 hours. No results for input(s): AMMONIA in the last 168 hours. Coagulation Profile: No results for input(s): INR, PROTIME in the last 168 hours. Cardiac Enzymes: No results for input(s): CKTOTAL, CKMB, CKMBINDEX, TROPONINI in the last 168 hours. BNP (last 3 results) No results for input(s): PROBNP in the last 8760 hours. HbA1C: No results for input(s): HGBA1C in the last 72 hours. CBG: Recent Labs  Lab 11/12/20 1159 11/12/20 1703 11/12/20 2109 11/13/20 0753 11/13/20 1134  GLUCAP 199* 170* 194* 94 173*    Lipid Profile: No results for input(s): CHOL, HDL, LDLCALC, TRIG, CHOLHDL, LDLDIRECT in the last 72 hours. Thyroid Function Tests: No results for input(s): TSH, T4TOTAL, FREET4, T3FREE, THYROIDAB in  the last 72 hours. Anemia Panel: No results for input(s): VITAMINB12, FOLATE, FERRITIN, TIBC, IRON, RETICCTPCT in the last 72 hours. Sepsis Labs: No results for input(s): PROCALCITON, LATICACIDVEN in the last 168 hours.  No results found for this or any previous visit (from the past 240 hour(s)).    Radiology Studies: No results found.  Scheduled Meds:  apixaban  5 mg Oral BID   divalproex  1,000 mg Oral Daily   escitalopram  20 mg Oral Daily   feeding supplement (GLUCERNA SHAKE)  237 mL Oral TID BM   insulin aspart  0-5 Units Subcutaneous QHS   insulin aspart  0-9 Units Subcutaneous TID WC   insulin detemir  10 Units Subcutaneous BID   ketotifen  1 drop Both Eyes TID   melatonin  5 mg Oral QHS   multivitamin with minerals  1 tablet Oral Daily   polyethylene glycol  17 g Oral Daily   ramipril  5 mg Oral Daily   senna-docusate  1 tablet Oral QHS   thiamine  100 mg Oral Daily   vitamin B-12  1,000 mcg Oral Daily     LOS: 43 days    Time spent: 25 minutes. This record has been created using Systems analyst. Errors have been sought and corrected,but may not always be located. Such creation errors do not reflect on the standard of care.    Lorella Nimrod, MD [facility]    11/13/2020, 4:15 PM

## 2020-11-14 DIAGNOSIS — G9341 Metabolic encephalopathy: Secondary | ICD-10-CM | POA: Diagnosis not present

## 2020-11-14 LAB — GLUCOSE, CAPILLARY
Glucose-Capillary: 140 mg/dL — ABNORMAL HIGH (ref 70–99)
Glucose-Capillary: 187 mg/dL — ABNORMAL HIGH (ref 70–99)
Glucose-Capillary: 233 mg/dL — ABNORMAL HIGH (ref 70–99)
Glucose-Capillary: 173 mg/dL — ABNORMAL HIGH (ref 70–99)

## 2020-11-14 LAB — CBC
HCT: 37.2 % (ref 36.0–46.0)
Hemoglobin: 12.3 g/dL (ref 12.0–15.0)
MCH: 30 pg (ref 26.0–34.0)
MCHC: 33.1 g/dL (ref 30.0–36.0)
MCV: 90.7 fL (ref 80.0–100.0)
Platelets: 123 10*3/uL — ABNORMAL LOW (ref 150–400)
RBC: 4.1 MIL/uL (ref 3.87–5.11)
RDW: 13.7 % (ref 11.5–15.5)
WBC: 4.2 10*3/uL (ref 4.0–10.5)
nRBC: 0 % (ref 0.0–0.2)

## 2020-11-14 NOTE — TOC Progression Note (Signed)
Transition of Care [facility]) - Progression Note    Patient Details  Name: ZAKYLA MCKEONE MRN: [id number] Date of Birth: 27-Apr-1932  Transition [facility] CM/SW Contact  Shelbie Hutching, RN Phone Number: 11/14/2020, 3:00 PM  Clinical Narrative:    Jeralene Huff out to [facility] health and rehab again, admissions is still waiting to hear back from their business office.     Expected Discharge Plan: Assisted Living Barriers to Discharge: Family Issues, Other (must enter comment) (Medicaid application pending-no ALF bed)  Expected Discharge Plan and Services Expected Discharge Plan: Assisted Living   Discharge Planning Services: CM Consult   Living arrangements for the past 2 months: Single Family Home Expected Discharge Date: 10/03/20               DME Arranged: N/A DME Agency: NA       HH Arranged: NA           Social Determinants of Health (SDOH) Interventions    Readmission Risk Interventions No flowsheet data found.

## 2020-11-14 NOTE — Progress Notes (Signed)
PROGRESS NOTE    Daisy NAKAO  [id number] DOB: April 28, 1932 DOA: 09/30/2020 PCP: Valerie Roys, DO    Brief Narrative:  [name] is a 87 y.o. F with HTN, diabetes, meningioma, A. fib/history DVT on Eliquis, and CAD who presented with agitation.   Medical stable and waiting for placement.  Unsafe discharge.  Will need memory care unit.  See interim discharge summary from 10/03/20  Subjective. No overnight issues, patient was resting comfortably, patient is AOx3,  denied any active issues at this time.   Assessment & Plan:   History of DVT and paroxysmal atrial fibrillation - Continue Eliquis  Dementia - Continue Lexapro and Depakote - Continue melatonin  Diabetes.  CBG remained borderline -Decrease Levemir to 10 units twice daily from 15. -Continue with sensitive SSI  Hypertension Blood pressure ocntorlled - Continue ramipril   DVT prophylaxis:  Eliquis Code Status: full Family Communication:  Disposition Plan:      Status is: Inpatient   Remains inpatient appropriate because:Unsafe d/c plan   Dispo: The patient is from: Home              Anticipated d/c is to: ALF              Patient currently is medically stable to d/c.              Difficult to place patient Yes  Consultants:  None  Procedures: None  Antimicrobials: None   Objective: Vitals:   11/13/20 1938 11/14/20 0018 11/14/20 0436 11/14/20 0839  BP: (!) 143/48 (!) 143/63 (!) 136/52 (!) 137/53  Pulse: (!) 53 (!) 47 (!) 44 (!) 42  Resp: '16 16 16 17  '$ Temp: 97.9 F (36.6 C) 97.6 F (36.4 C) 97.6 F (36.4 C) 97.6 F (36.4 C)  TempSrc:   Oral   SpO2: 98% 98% 98% 98%  Weight:      Height:        Intake/Output Summary (Last 24 hours) at 11/14/2020 1526 Last data filed at 11/14/2020 0912 Gross per 24 hour  Intake --  Output 650 ml  Net -650 ml     Filed Weights   10/11/20 0601 10/23/20 1409 11/05/20 1401  Weight: 75.1 kg 76.1 kg 78.3 kg    Examination:  General.  Frail elderly  lady, in no acute distress. Pulmonary.  Lungs clear bilaterally, normal respiratory effort. CV.  Regular rate and rhythm, no JVD, rub or murmur. Abdomen.  Soft, nontender, nondistended, BS positive. CNS.  Alert and oriented to self only.  No focal neurologic deficit. Extremities.  No edema, no cyanosis, pulses intact and symmetrical. Psychiatry.  Judgment and insight appears impaired.   Data Reviewed: I have personally reviewed following labs and imaging studies  CBC: Recent Labs  Lab 11/11/20 0537 11/14/20 0647  WBC 4.4 4.2  HGB 12.3 12.3  HCT 37.7 37.2  MCV 91.1 90.7  PLT 136* [id number]   Basic Metabolic Panel: No results for input(s): NA, K, CL, CO2, GLUCOSE, BUN, CREATININE, CALCIUM, MG, PHOS in the last 168 hours. GFR: Estimated Creatinine Clearance: 53.3 mL/min (by C-G formula based on SCr of 0.69 mg/dL). Liver Function Tests: No results for input(s): AST, ALT, ALKPHOS, BILITOT, PROT, ALBUMIN in the last 168 hours. No results for input(s): LIPASE, AMYLASE in the last 168 hours. No results for input(s): AMMONIA in the last 168 hours. Coagulation Profile: No results for input(s): INR, PROTIME in the last 168 hours. Cardiac Enzymes: No results for input(s): CKTOTAL, CKMB, CKMBINDEX, TROPONINI  in the last 168 hours. BNP (last 3 results) No results for input(s): PROBNP in the last 8760 hours. HbA1C: No results for input(s): HGBA1C in the last 72 hours. CBG: Recent Labs  Lab 11/13/20 1134 11/13/20 1621 11/13/20 2029 11/14/20 0758 11/14/20 1306  GLUCAP 173* 185* 180* 140* 187*   Lipid Profile: No results for input(s): CHOL, HDL, LDLCALC, TRIG, CHOLHDL, LDLDIRECT in the last 72 hours. Thyroid Function Tests: No results for input(s): TSH, T4TOTAL, FREET4, T3FREE, THYROIDAB in the last 72 hours. Anemia Panel: No results for input(s): VITAMINB12, FOLATE, FERRITIN, TIBC, IRON, RETICCTPCT in the last 72 hours. Sepsis Labs: No results for input(s): PROCALCITON, LATICACIDVEN  in the last 168 hours.  No results found for this or any previous visit (from the past 240 hour(s)).    Radiology Studies: No results found.  Scheduled Meds:  apixaban  5 mg Oral BID   divalproex  1,000 mg Oral Daily   escitalopram  20 mg Oral Daily   feeding supplement (GLUCERNA SHAKE)  237 mL Oral TID BM   insulin aspart  0-5 Units Subcutaneous QHS   insulin aspart  0-9 Units Subcutaneous TID WC   insulin detemir  10 Units Subcutaneous BID   ketotifen  1 drop Both Eyes TID   melatonin  5 mg Oral QHS   multivitamin with minerals  1 tablet Oral Daily   polyethylene glycol  17 g Oral Daily   ramipril  5 mg Oral Daily   senna-docusate  1 tablet Oral QHS   thiamine  100 mg Oral Daily   vitamin B-12  1,000 mcg Oral Daily     LOS: 44 days    Time spent: 25 minutes. This record has been created using Systems analyst. Errors have been sought and corrected,but may not always be located. Such creation errors do not reflect on the standard of care.    Val Riles, MD [facility]    11/14/2020, 3:26 PM

## 2020-11-14 NOTE — Progress Notes (Signed)
Physical Therapy Treatment Patient Details Name: Daisy Watkins MRN: [id number] DOB: 1932-05-05 Today's Date: 11/14/2020    History of Present Illness 87 y/o admitted on 09/30/20 with c/c of AMS with confusion and agitation. Pt recently d/c on 09/15/20 after being admitted for change in mental status. PMH: anxiety, DVT, DM, GERD, insomnia, HTN, HLD, insomnia, myalgia.    PT Comments    Pt was willing to participate during the session after encouragement but declined OOB activities. Pt's BP and HR assessed at 131/41 and 64bpm. Pt gave effort during therex requiring frequent multimodal cueing for sequencing. Pt is still limited with functional mobility secondary to unwillingness to participate and generalized weakness. Pt will benefit from OOB and ambulation activities next visit as appropriate. Pt will benefit from PT services in a SNF setting upon discharge to safely address deficits listed in patient problem list for decreased caregiver assistance and eventual return to PLOF.     Follow Up Recommendations  Supervision/Assistance - 24 hour;Supervision for mobility/OOB     Equipment Recommendations  Other (comment) (TBD in next venue of care)    Recommendations for Other Services       Precautions / Restrictions Precautions Precautions: Fall Precaution Comments: dementia Restrictions Weight Bearing Restrictions: No    Mobility  Bed Mobility               General bed mobility comments: Declined bed mobility and OOB activties    Transfers                    Ambulation/Gait                 Stairs             Wheelchair Mobility    Modified Rankin (Stroke Patients Only)       Balance       Sitting balance - Comments: NT       Standing balance comment: NT                            Cognition Arousal/Alertness: Awake/alert Behavior During Therapy: Flat affect Overall Cognitive Status: History of cognitive impairments - at  baseline                                        Exercises Total Joint Exercises Ankle Circles/Pumps: AROM;Strengthening;Both;10 reps;Supine (Manual resistance) Towel Squeeze: AROM;Strengthening;Both;10 reps;Supine Hip ABduction/ADduction: Both;10 reps;Supine;AROM Straight Leg Raises: Strengthening;Both;10 reps;Supine;AROM    General Comments        Pertinent Vitals/Pain Pain Assessment: Faces Faces Pain Scale: Hurts little more Pain Location: Right knee activity Pain Descriptors / Indicators: Aching;Sore Pain Intervention(s): Monitored during session    Home Living                      Prior Function            PT Goals (current goals can now be found in the care plan section) Progress towards PT goals: Not progressing toward goals - comment (Self limiting; unwilling to perfrom OOB activites)    Frequency    Min 2X/week      PT Plan Current plan remains appropriate    Co-evaluation              AM-PAC PT "6 Clicks" Mobility   Outcome Measure  Help needed  turning from your back to your side while in a flat bed without using bedrails?: A Little Help needed moving from lying on your back to sitting on the side of a flat bed without using bedrails?: A Lot Help needed moving to and from a bed to a chair (including a wheelchair)?: A Lot Help needed standing up from a chair using your arms (e.g., wheelchair or bedside chair)?: A Lot Help needed to walk in hospital room?: A Little Help needed climbing 3-5 steps with a railing? : A Lot 6 Click Score: 14    End of Session Equipment Utilized During Treatment: Gait belt Activity Tolerance: Other (comment) (self limiting) Patient left: in bed;with call bell/phone within reach;with bed alarm set Nurse Communication: Mobility status PT Visit Diagnosis: Unsteadiness on feet (R26.81);Muscle weakness (generalized) (M62.81)     Time: [id number] PT Time Calculation (min) (ACUTE ONLY): 17  min  Charges:                        Dayton Scrape SPT 11/14/20, 5:29 PM

## 2020-11-15 DIAGNOSIS — G9341 Metabolic encephalopathy: Secondary | ICD-10-CM | POA: Diagnosis not present

## 2020-11-15 LAB — GLUCOSE, CAPILLARY
Glucose-Capillary: 146 mg/dL — ABNORMAL HIGH (ref 70–99)
Glucose-Capillary: 178 mg/dL — ABNORMAL HIGH (ref 70–99)
Glucose-Capillary: 239 mg/dL — ABNORMAL HIGH (ref 70–99)
Glucose-Capillary: 243 mg/dL — ABNORMAL HIGH (ref 70–99)

## 2020-11-15 NOTE — Progress Notes (Signed)
PROGRESS NOTE    Daisy Watkins  [id number] DOB: [birth year] DOA: 09/30/2020 PCP: Valerie Roys, DO    Brief Narrative:  Daisy Watkins is a 87 y.o. F with HTN, diabetes, meningioma, A. fib/history DVT on Eliquis, and CAD who presented with agitation.   Medical stable and waiting for placement.  Unsafe discharge.  Will need memory care unit.  See interim discharge summary from 10/03/20  Subjective. No overnight issues, patient was resting comfortably, patient is AOx3,  denied any active issues at this time.   Assessment & Plan:   History of DVT and paroxysmal atrial fibrillation - Continue Eliquis  Dementia - Continue Lexapro and Depakote - Continue melatonin  Diabetes.  CBG remained borderline -Decrease Levemir to 10 units twice daily from 15. -Continue with sensitive SSI  Hypertension Blood pressure ocntorlled - Continue ramipril   DVT prophylaxis:  Eliquis Code Status: full Family Communication:  Disposition Plan:      Status is: Inpatient   Remains inpatient appropriate because:Unsafe d/c plan   Dispo: The patient is from: Home              Anticipated d/c is to: ALF              Patient currently is medically stable to d/c.              Difficult to place patient Yes  Consultants:  None  Procedures: None  Antimicrobials: None   Objective: Vitals:   11/15/20 0506 11/15/20 0758 11/15/20 0806 11/15/20 1132  BP: (!) 116/41 124/67  (!) 116/51  Pulse: (!) 59 (!) 41 (!) 54 (!) 55  Resp: '16 16  15  '$ Temp: (!) 97.5 F (36.4 C) 98.1 F (36.7 C)  (!) 97.5 F (36.4 C)  TempSrc:      SpO2: 96% 98%  95%  Weight:      Height:        Intake/Output Summary (Last 24 hours) at 11/15/2020 1558 Last data filed at 11/15/2020 0500 Gross per 24 hour  Intake --  Output 600 ml  Net -600 ml     Filed Weights   10/11/20 0601 10/23/20 1409 11/05/20 1401  Weight: 75.1 kg 76.1 kg 78.3 kg    Examination:  General.  Frail elderly lady, in no acute  distress. Pulmonary.  Lungs clear bilaterally, normal respiratory effort. CV.  Regular rate and rhythm, no JVD, rub or murmur. Abdomen.  Soft, nontender, nondistended, BS positive. CNS.  Alert and oriented to self only.  No focal neurologic deficit. Extremities.  No edema, no cyanosis, pulses intact and symmetrical. Psychiatry.  Judgment and insight appears impaired.   Data Reviewed: I have personally reviewed following labs and imaging studies  CBC: Recent Labs  Lab 11/11/20 0537 11/14/20 0647  WBC 4.4 4.2  HGB 12.3 12.3  HCT 37.7 37.2  MCV 91.1 90.7  PLT 136* [id number]*   Basic Metabolic Panel: No results for input(s): NA, K, CL, CO2, GLUCOSE, BUN, CREATININE, CALCIUM, MG, PHOS in the last 168 hours. GFR: Estimated Creatinine Clearance: 53.3 mL/min (by C-G formula based on SCr of 0.69 mg/dL). Liver Function Tests: No results for input(s): AST, ALT, ALKPHOS, BILITOT, PROT, ALBUMIN in the last 168 hours. No results for input(s): LIPASE, AMYLASE in the last 168 hours. No results for input(s): AMMONIA in the last 168 hours. Coagulation Profile: No results for input(s): INR, PROTIME in the last 168 hours. Cardiac Enzymes: No results for input(s): CKTOTAL, CKMB, CKMBINDEX, TROPONINI in the last  168 hours. BNP (last 3 results) No results for input(s): PROBNP in the last 8760 hours. HbA1C: No results for input(s): HGBA1C in the last 72 hours. CBG: Recent Labs  Lab 11/14/20 1306 11/14/20 1743 11/14/20 2047 11/15/20 0759 11/15/20 1130  GLUCAP 187* 233* 173* 146* 178*   Lipid Profile: No results for input(s): CHOL, HDL, LDLCALC, TRIG, CHOLHDL, LDLDIRECT in the last 72 hours. Thyroid Function Tests: No results for input(s): TSH, T4TOTAL, FREET4, T3FREE, THYROIDAB in the last 72 hours. Anemia Panel: No results for input(s): VITAMINB12, FOLATE, FERRITIN, TIBC, IRON, RETICCTPCT in the last 72 hours. Sepsis Labs: No results for input(s): PROCALCITON, LATICACIDVEN in the last 168  hours.  No results found for this or any previous visit (from the past 240 hour(s)).    Radiology Studies: No results found.  Scheduled Meds:  apixaban  5 mg Oral BID   divalproex  1,000 mg Oral Daily   escitalopram  20 mg Oral Daily   feeding supplement (GLUCERNA SHAKE)  237 mL Oral TID BM   insulin aspart  0-5 Units Subcutaneous QHS   insulin aspart  0-9 Units Subcutaneous TID WC   insulin detemir  10 Units Subcutaneous BID   ketotifen  1 drop Both Eyes TID   melatonin  5 mg Oral QHS   multivitamin with minerals  1 tablet Oral Daily   polyethylene glycol  17 g Oral Daily   ramipril  5 mg Oral Daily   senna-docusate  1 tablet Oral QHS   thiamine  100 mg Oral Daily   vitamin B-12  1,000 mcg Oral Daily     LOS: 45 days    Time spent: 25 minutes. This record has been created using Systems analyst. Errors have been sought and corrected,but may not always be located. Such creation errors do not reflect on the standard of care.    Val Riles, MD [facility]    11/15/2020, 3:58 PM

## 2020-11-16 DIAGNOSIS — G9341 Metabolic encephalopathy: Secondary | ICD-10-CM | POA: Diagnosis not present

## 2020-11-16 LAB — GLUCOSE, CAPILLARY
Glucose-Capillary: 75 mg/dL (ref 70–99)
Glucose-Capillary: 163 mg/dL — ABNORMAL HIGH (ref 70–99)
Glucose-Capillary: 193 mg/dL — ABNORMAL HIGH (ref 70–99)
Glucose-Capillary: 206 mg/dL — ABNORMAL HIGH (ref 70–99)

## 2020-11-16 NOTE — Progress Notes (Signed)
Physical Therapy Treatment Patient Details Name: Daisy Watkins MRN: [id number] DOB: 09-11-1932 Today's Date: 11/16/2020    History of Present Illness Pt is an 87 y/o F admitted on 09/30/20 with c/c of AMS with confusion and agitation. Pt recently d/c on 09/15/20 after being admitted for change in mental status. PMH: anxiety, DVT, DM, GERD, insomnia, HTN, HLD, insomnia, myalgia.    PT Comments    Pt required min to mod encouragement to participate during the session but overall put forth more effort than in prior sessions.  Pt was able to amb 10 feet with slow but steady cadence and required only min A with bed mobility and transfers.  Pt put forth fair effort with below exercises and reported no adverse symptoms during the session.  Pt will benefit from PT services in a SNF setting upon discharge to safely address deficits listed in patient problem list for decreased caregiver assistance and eventual return to PLOF.    Follow Up Recommendations  Supervision/Assistance - 24 hour;Supervision for mobility/OOB     Equipment Recommendations  Other (comment) (TBD at next venue of care)    Recommendations for Other Services       Precautions / Restrictions Precautions Precautions: Fall Precaution Comments: dementia Restrictions Weight Bearing Restrictions: No    Mobility  Bed Mobility Overal bed mobility: Needs Assistance Bed Mobility: Sit to Supine       Sit to supine: Min assist   General bed mobility comments: Min A for trunk and BLE positioning    Transfers Overall transfer level: Needs assistance Equipment used: Rolling walker (2 wheeled) Transfers: Sit to/from Stand Sit to Stand: Min assist         General transfer comment: Min A to come to standing and for stability upon initial stand  Ambulation/Gait Ambulation/Gait assistance: Min guard Gait Distance (Feet): 10 Feet Assistive device: Rolling walker (2 wheeled) Gait Pattern/deviations: Step-through  pattern;Decreased step length - right;Decreased step length - left;Trunk flexed Gait velocity: decreased   General Gait Details: Mod verbal cues for amb closer to the RW for more upright posture with poor carryover but pt steady during amb without LOB   Stairs             Wheelchair Mobility    Modified Rankin (Stroke Patients Only)       Balance Overall balance assessment: Needs assistance Sitting-balance support: Bilateral upper extremity supported;Feet unsupported Sitting balance-Leahy Scale: Fair     Standing balance support: Bilateral upper extremity supported;During functional activity Standing balance-Leahy Scale: Fair                              Cognition Arousal/Alertness: Awake/alert Behavior During Therapy: Flat affect Overall Cognitive Status: History of cognitive impairments - at baseline                                        Exercises Total Joint Exercises Ankle Circles/Pumps: AROM;Strengthening;Both;10 reps Short Arc Quad: AROM;Strengthening;Both;10 reps Hip ABduction/ADduction: AAROM;Strengthening;Both;10 reps Straight Leg Raises: 10 reps;Both;Strengthening;AAROM Long Arc Quad: AAROM;Strengthening;Both;10 reps Other Exercises Other Exercises: Dynamic sitting balance training with reaching outside BOS    General Comments        Pertinent Vitals/Pain Pain Assessment: No/denies pain    Home Living  Prior Function            PT Goals (current goals can now be found in the care plan section) Progress towards PT goals: Progressing toward goals    Frequency    Min 2X/week      PT Plan Current plan remains appropriate    Co-evaluation              AM-PAC PT "6 Clicks" Mobility   Outcome Measure  Help needed turning from your back to your side while in a flat bed without using bedrails?: A Little Help needed moving from lying on your back to sitting on the side of a  flat bed without using bedrails?: A Little Help needed moving to and from a bed to a chair (including a wheelchair)?: A Little Help needed standing up from a chair using your arms (e.g., wheelchair or bedside chair)?: A Little Help needed to walk in hospital room?: A Little Help needed climbing 3-5 steps with a railing? : A Lot 6 Click Score: 17    End of Session Equipment Utilized During Treatment: Gait belt Activity Tolerance: Patient tolerated treatment well Patient left: in bed;with call bell/phone within reach;with bed alarm set Nurse Communication: Mobility status PT Visit Diagnosis: Unsteadiness on feet (R26.81);Muscle weakness (generalized) (M62.81)     Time: [id number] PT Time Calculation (min) (ACUTE ONLY): 25 min  Charges:  $Gait Training: 8-22 mins $Therapeutic Exercise: 8-22 mins                     D. Scott Delylah Stanczyk PT, DPT 11/16/20, 10:34 AM

## 2020-11-16 NOTE — Progress Notes (Signed)
PROGRESS NOTE    Daisy Watkins  [id number] DOB: 03-27-1933 DOA: 09/30/2020 PCP: Valerie Roys, DO    Brief Narrative:  Mrs. Borland is a 87 y.o. F with HTN, diabetes, meningioma, A. fib/history DVT on Eliquis, and CAD who presented with agitation.   Medical stable and waiting for placement.  Unsafe discharge.  Will need memory care unit.  See interim discharge summary from 10/03/20  Subjective. No overnight issues, patient was resting comfortably, patient is AAOx3,  denied any active issues at this time.   Assessment & Plan:   History of DVT and paroxysmal atrial fibrillation - Continue Eliquis  Dementia - Continue Lexapro and Depakote - Continue melatonin  Diabetes.  CBG remained borderline -Decrease Levemir to 10 units twice daily from 15. -Continue with sensitive SSI  Hypertension and bradycardia, asymptomatic, not on BB or CCB  Blood pressure ocntorlled - Continue ramipril   DVT prophylaxis:  Eliquis Code Status: full Family Communication:  Disposition Plan:      Status is: Inpatient   Remains inpatient appropriate because:Unsafe d/c plan   Dispo: The patient is from: Home              Anticipated d/c is to: ALF              Patient currently is medically stable to d/c.              Difficult to place patient Yes  Consultants:  None  Procedures: None  Antimicrobials: None   Objective: Vitals:   11/15/20 2126 11/16/20 0554 11/16/20 0819 11/16/20 1211  BP: (!) 132/49 (!) 127/47 (!) 114/41 (!) 121/53  Pulse: (!) 58 (!) 52 98 (!) 59  Resp: '16 16 15 16  '$ Temp: 97.9 F (36.6 C) 97.9 F (36.6 C) (!) 97.4 F (36.3 C) 97.7 F (36.5 C)  TempSrc:      SpO2: 98% 100% 97% 97%  Weight:      Height:        Intake/Output Summary (Last 24 hours) at 11/16/2020 1245 Last data filed at 11/16/2020 0557 Gross per 24 hour  Intake --  Output 450 ml  Net -450 ml     Filed Weights   10/11/20 0601 10/23/20 1409 11/05/20 1401  Weight: 75.1 kg 76.1 kg 78.3  kg    Examination:  General.  Frail elderly lady, in no acute distress. Pulmonary.  Lungs clear bilaterally, normal respiratory effort. CV.  Regular rate and rhythm, no JVD, rub or murmur. Abdomen.  Soft, nontender, nondistended, BS positive. CNS.  Alert and oriented to self only.  No focal neurologic deficit. Extremities.  No edema, no cyanosis, pulses intact and symmetrical. Psychiatry.  Judgment and insight appears impaired.   Data Reviewed: I have personally reviewed following labs and imaging studies  CBC: Recent Labs  Lab 11/11/20 0537 11/14/20 0647  WBC 4.4 4.2  HGB 12.3 12.3  HCT 37.7 37.2  MCV 91.1 90.7  PLT 136* [id number]*   Basic Metabolic Panel: No results for input(s): NA, K, CL, CO2, GLUCOSE, BUN, CREATININE, CALCIUM, MG, PHOS in the last 168 hours. GFR: Estimated Creatinine Clearance: 53.3 mL/min (by C-G formula based on SCr of 0.69 mg/dL). Liver Function Tests: No results for input(s): AST, ALT, ALKPHOS, BILITOT, PROT, ALBUMIN in the last 168 hours. No results for input(s): LIPASE, AMYLASE in the last 168 hours. No results for input(s): AMMONIA in the last 168 hours. Coagulation Profile: No results for input(s): INR, PROTIME in the last 168 hours. Cardiac  Enzymes: No results for input(s): CKTOTAL, CKMB, CKMBINDEX, TROPONINI in the last 168 hours. BNP (last 3 results) No results for input(s): PROBNP in the last 8760 hours. HbA1C: No results for input(s): HGBA1C in the last 72 hours. CBG: Recent Labs  Lab 11/15/20 1130 11/15/20 1606 11/15/20 2129 11/16/20 0812 11/16/20 1209  GLUCAP 178* 239* 243* 75 163*   Lipid Profile: No results for input(s): CHOL, HDL, LDLCALC, TRIG, CHOLHDL, LDLDIRECT in the last 72 hours. Thyroid Function Tests: No results for input(s): TSH, T4TOTAL, FREET4, T3FREE, THYROIDAB in the last 72 hours. Anemia Panel: No results for input(s): VITAMINB12, FOLATE, FERRITIN, TIBC, IRON, RETICCTPCT in the last 72 hours. Sepsis Labs: No  results for input(s): PROCALCITON, LATICACIDVEN in the last 168 hours.  No results found for this or any previous visit (from the past 240 hour(s)).    Radiology Studies: No results found.  Scheduled Meds:  apixaban  5 mg Oral BID   divalproex  1,000 mg Oral Daily   escitalopram  20 mg Oral Daily   feeding supplement (GLUCERNA SHAKE)  237 mL Oral TID BM   insulin aspart  0-5 Units Subcutaneous QHS   insulin aspart  0-9 Units Subcutaneous TID WC   insulin detemir  10 Units Subcutaneous BID   ketotifen  1 drop Both Eyes TID   melatonin  5 mg Oral QHS   multivitamin with minerals  1 tablet Oral Daily   polyethylene glycol  17 g Oral Daily   ramipril  5 mg Oral Daily   senna-docusate  1 tablet Oral QHS   thiamine  100 mg Oral Daily   vitamin B-12  1,000 mcg Oral Daily     LOS: 46 days    Time spent: 25 minutes. This record has been created using Systems analyst. Errors have been sought and corrected,but may not always be located. Such creation errors do not reflect on the standard of care.    Val Riles, MD [facility]    11/16/2020, 12:45 PM

## 2020-11-17 DIAGNOSIS — G9341 Metabolic encephalopathy: Secondary | ICD-10-CM | POA: Diagnosis not present

## 2020-11-17 LAB — GLUCOSE, CAPILLARY
Glucose-Capillary: 117 mg/dL — ABNORMAL HIGH (ref 70–99)
Glucose-Capillary: 169 mg/dL — ABNORMAL HIGH (ref 70–99)
Glucose-Capillary: 195 mg/dL — ABNORMAL HIGH (ref 70–99)
Glucose-Capillary: 203 mg/dL — ABNORMAL HIGH (ref 70–99)

## 2020-11-17 LAB — CBC
HCT: 36.3 % (ref 36.0–46.0)
Hemoglobin: 12 g/dL (ref 12.0–15.0)
MCH: 30.3 pg (ref 26.0–34.0)
MCHC: 33.1 g/dL (ref 30.0–36.0)
MCV: 91.7 fL (ref 80.0–100.0)
Platelets: 140 10*3/uL — ABNORMAL LOW (ref 150–400)
RBC: 3.96 MIL/uL (ref 3.87–5.11)
RDW: 13.6 % (ref 11.5–15.5)
WBC: 4.9 10*3/uL (ref 4.0–10.5)
nRBC: 0 % (ref 0.0–0.2)

## 2020-11-17 NOTE — Progress Notes (Signed)
PROGRESS NOTE    Daisy Watkins  [id number] DOB: 04/29/1932 DOA: 09/30/2020 PCP: Valerie Roys, DO    Brief Narrative:  Daisy Watkins is a 87 y.o. F with HTN, diabetes, meningioma, A. fib/history DVT on Eliquis, and CAD who presented with agitation.   Medical stable and waiting for placement.  Unsafe discharge.  Will need memory care unit.  See interim discharge summary from 10/03/20  Subjective. No overnight issues, patient was resting comfortably, patient is AAOx3,  denied any active issues at this time.   Assessment & Plan:   History of DVT and paroxysmal atrial fibrillation - Continue Eliquis  Dementia - Continue Lexapro and Depakote - Continue melatonin  Diabetes.  CBG remained borderline -Decrease Levemir to 10 units twice daily from 15. -Continue with sensitive SSI  Hypertension and bradycardia, asymptomatic, not on BB or CCB  Blood pressure ocntorlled - Continue ramipril   DVT prophylaxis:  Eliquis Code Status: full Family Communication:  Disposition Plan:      Status is: Inpatient   Remains inpatient appropriate because:Unsafe d/c plan   Dispo: The patient is from: Home              Anticipated d/c is to: ALF              Patient currently is medically stable to d/c.              Difficult to place patient Yes  Consultants:  None  Procedures: None  Antimicrobials: None   Objective: Vitals:   11/16/20 1617 11/16/20 2217 11/17/20 0721 11/17/20 1108  BP: 109/64 (!) 120/42 (!) 107/49 120/60  Pulse: (!) 56 (!) 56 (!) 55 (!) 53  Resp: '17 16 18 18  '$ Temp: (!) 97.4 F (36.3 C) 97.7 F (36.5 C) 98.4 F (36.9 C) 98.1 F (36.7 C)  TempSrc:   Oral Oral  SpO2: 98% 96% 96% 97%  Weight:      Height:        Intake/Output Summary (Last 24 hours) at 11/17/2020 1342 Last data filed at 11/17/2020 [id number] Gross per 24 hour  Intake 200 ml  Output 765 ml  Net -565 ml     Filed Weights   10/11/20 0601 10/23/20 1409 11/05/20 1401  Weight: 75.1 kg 76.1 kg  78.3 kg    Examination:  General.  Frail elderly lady, in no acute distress. Pulmonary.  Lungs clear bilaterally, normal respiratory effort. CV.  Regular rate and rhythm, no JVD, rub or murmur. Abdomen.  Soft, nontender, nondistended, BS positive. CNS.  Alert and oriented to self only.  No focal neurologic deficit. Extremities.  No edema, no cyanosis, pulses intact and symmetrical. Psychiatry.  Judgment and insight appears impaired.   Data Reviewed: I have personally reviewed following labs and imaging studies  CBC: Recent Labs  Lab 11/11/20 0537 11/14/20 0647 11/17/20 0447  WBC 4.4 4.2 4.9  HGB 12.3 12.3 12.0  HCT 37.7 37.2 36.3  MCV 91.1 90.7 91.7  PLT 136* 123* [id number]*   Basic Metabolic Panel: No results for input(s): NA, K, CL, CO2, GLUCOSE, BUN, CREATININE, CALCIUM, MG, PHOS in the last 168 hours. GFR: Estimated Creatinine Clearance: 53.3 mL/min (by C-G formula based on SCr of 0.69 mg/dL). Liver Function Tests: No results for input(s): AST, ALT, ALKPHOS, BILITOT, PROT, ALBUMIN in the last 168 hours. No results for input(s): LIPASE, AMYLASE in the last 168 hours. No results for input(s): AMMONIA in the last 168 hours. Coagulation Profile: No results for input(s): INR,  PROTIME in the last 168 hours. Cardiac Enzymes: No results for input(s): CKTOTAL, CKMB, CKMBINDEX, TROPONINI in the last 168 hours. BNP (last 3 results) No results for input(s): PROBNP in the last 8760 hours. HbA1C: No results for input(s): HGBA1C in the last 72 hours. CBG: Recent Labs  Lab 11/16/20 1209 11/16/20 1618 11/16/20 2251 11/17/20 0742 11/17/20 1116  GLUCAP 163* 193* 206* 117* 169*   Lipid Profile: No results for input(s): CHOL, HDL, LDLCALC, TRIG, CHOLHDL, LDLDIRECT in the last 72 hours. Thyroid Function Tests: No results for input(s): TSH, T4TOTAL, FREET4, T3FREE, THYROIDAB in the last 72 hours. Anemia Panel: No results for input(s): VITAMINB12, FOLATE, FERRITIN, TIBC, IRON,  RETICCTPCT in the last 72 hours. Sepsis Labs: No results for input(s): PROCALCITON, LATICACIDVEN in the last 168 hours.  No results found for this or any previous visit (from the past 240 hour(s)).    Radiology Studies: No results found.  Scheduled Meds:  apixaban  5 mg Oral BID   divalproex  1,000 mg Oral Daily   escitalopram  20 mg Oral Daily   feeding supplement (GLUCERNA SHAKE)  237 mL Oral TID BM   insulin aspart  0-5 Units Subcutaneous QHS   insulin aspart  0-9 Units Subcutaneous TID WC   insulin detemir  10 Units Subcutaneous BID   ketotifen  1 drop Both Eyes TID   melatonin  5 mg Oral QHS   multivitamin with minerals  1 tablet Oral Daily   polyethylene glycol  17 g Oral Daily   ramipril  5 mg Oral Daily   senna-docusate  1 tablet Oral QHS   thiamine  100 mg Oral Daily   vitamin B-12  1,000 mcg Oral Daily     LOS: 47 days    Time spent: 25 minutes. This record has been created using Systems analyst. Errors have been sought and corrected,but may not always be located. Such creation errors do not reflect on the standard of care.    Val Riles, MD [facility]    11/17/2020, 1:42 PM

## 2020-11-18 DIAGNOSIS — G9341 Metabolic encephalopathy: Secondary | ICD-10-CM | POA: Diagnosis not present

## 2020-11-18 LAB — GLUCOSE, CAPILLARY
Glucose-Capillary: 113 mg/dL — ABNORMAL HIGH (ref 70–99)
Glucose-Capillary: 147 mg/dL — ABNORMAL HIGH (ref 70–99)
Glucose-Capillary: 123 mg/dL — ABNORMAL HIGH (ref 70–99)
Glucose-Capillary: 157 mg/dL — ABNORMAL HIGH (ref 70–99)

## 2020-11-18 NOTE — Progress Notes (Signed)
PROGRESS NOTE    Daisy Watkins  [id number] DOB: 1933/02/11 DOA: 09/30/2020 PCP: Valerie Roys, DO   Brief Narrative:  87 year old with history of HTN, diabetes mellitus type 2, meningioma, A. fib, history of DVT on Eliquis, CAD admitted for agitation.  Family unable to take care of patient at home therefore unsafe discharge.  Will need memory care unit.  During the hospitalization she has had episodes of agitation and confusion,    Assessment & Plan:   Principal Problem:   Acute metabolic encephalopathy Active Problems:   Type 2 diabetes mellitus with hyperglycemia (HCC)   Coronary artery disease   Benign essential hypertension   Paroxysmal A-fib (HCC)   Anxiety, generalized   History of DVT (deep vein thrombosis)   Psychosis in elderly with behavioral disturbance (HCC)   AMS (altered mental status)   Meningioma (HCC)   Hypokalemia  Acute metabolic encephalopathy Mood disorder - Metabolic etiologies have been ruled out.  MRI was unremarkable.  Patient was seen by psychiatry, started on Depakote and escitalopram  History of meningioma - Follow-up outpatient  History of paroxysmal atrial fibrillation History of DVT - On Eliquis  Diabetes mellitus type 2 - On Levemir 10 units daily.  Insulin sliding scale and Accu-Cheks  Essential hypertension - On ramipril   DVT prophylaxis: Eliquis Code Status:  Family Communication:    Status is: Inpatient  Remains inpatient appropriate because:Unsafe d/c plan  Dispo: The patient is from: Home              Anticipated d/c is to: SNF              Patient currently is not medically stable to d/c.   Difficult to place patient No       Nutritional status  Nutrition Problem: Inadequate oral intake Etiology: poor appetite  Signs/Symptoms: per patient/family report, meal completion < 50%  Interventions: Liberalize Diet, MVI, Glucerna shake  Body mass index is 27.86 kg/m.           Subjective: No  complaints laying in the bed  Review of Systems Otherwise negative except as per HPI, including: General: Denies fever, chills, night sweats or unintended weight loss. Resp: Denies cough, wheezing, shortness of breath. Cardiac: Denies chest pain, palpitations, orthopnea, paroxysmal nocturnal dyspnea. GI: Denies abdominal pain, nausea, vomiting, diarrhea or constipation GU: Denies dysuria, frequency, hesitancy or incontinence MS: Denies muscle aches, joint pain or swelling Neuro: Denies headache, neurologic deficits (focal weakness, numbness, tingling), abnormal gait Psych: Denies anxiety, depression, SI/HI/AVH Skin: Denies new rashes or lesions ID: Denies sick contacts, exotic exposures, travel  Examination:  General exam: Appears calm and comfortable, elderly frail Respiratory system: Clear to auscultation. Respiratory effort normal. Cardiovascular system: S1 & S2 heard, RRR. No JVD, murmurs, rubs, gallops or clicks. No pedal edema. Gastrointestinal system: Abdomen is nondistended, soft and nontender. No organomegaly or masses felt. Normal bowel sounds heard. Central nervous system: Alert and oriented. No focal neurological deficits. Extremities: Symmetric 5 x 5 power. Skin: No rashes, lesions or ulcers Psychiatry: Judgement and insight appear normal. Mood & affect appropriate.     Objective: Vitals:   11/17/20 2156 11/18/20 0651 11/18/20 0734 11/18/20 1157  BP: 131/61 (!) 119/47 (!) 116/50 (!) 112/47  Pulse: (!) 57 (!) 51 (!) 47 (!) 45  Resp: '16 20 18 18  '$ Temp: (!) 97.5 F (36.4 C) 97.6 F (36.4 C) (!) 97.4 F (36.3 C) 98.5 F (36.9 C)  TempSrc:  Oral Oral Oral  SpO2: 98%  98% 97% 98%  Weight:      Height:        Intake/Output Summary (Last 24 hours) at 11/18/2020 1253 Last data filed at 11/18/2020 0700 Gross per 24 hour  Intake --  Output 550 ml  Net -550 ml   Filed Weights   10/11/20 0601 10/23/20 1409 11/05/20 1401  Weight: 75.1 kg 76.1 kg 78.3 kg     Data  Reviewed:   CBC: Recent Labs  Lab 11/14/20 0647 11/17/20 0447  WBC 4.2 4.9  HGB 12.3 12.0  HCT 37.2 36.3  MCV 90.7 91.7  PLT 123* [id number]*   Basic Metabolic Panel: No results for input(s): NA, K, CL, CO2, GLUCOSE, BUN, CREATININE, CALCIUM, MG, PHOS in the last 168 hours. GFR: Estimated Creatinine Clearance: 53.3 mL/min (by C-G formula based on SCr of 0.69 mg/dL). Liver Function Tests: No results for input(s): AST, ALT, ALKPHOS, BILITOT, PROT, ALBUMIN in the last 168 hours. No results for input(s): LIPASE, AMYLASE in the last 168 hours. No results for input(s): AMMONIA in the last 168 hours. Coagulation Profile: No results for input(s): INR, PROTIME in the last 168 hours. Cardiac Enzymes: No results for input(s): CKTOTAL, CKMB, CKMBINDEX, TROPONINI in the last 168 hours. BNP (last 3 results) No results for input(s): PROBNP in the last 8760 hours. HbA1C: No results for input(s): HGBA1C in the last 72 hours. CBG: Recent Labs  Lab 11/17/20 1116 11/17/20 1635 11/17/20 2157 11/18/20 0734 11/18/20 1200  GLUCAP 169* 195* 203* 113* 147*   Lipid Profile: No results for input(s): CHOL, HDL, LDLCALC, TRIG, CHOLHDL, LDLDIRECT in the last 72 hours. Thyroid Function Tests: No results for input(s): TSH, T4TOTAL, FREET4, T3FREE, THYROIDAB in the last 72 hours. Anemia Panel: No results for input(s): VITAMINB12, FOLATE, FERRITIN, TIBC, IRON, RETICCTPCT in the last 72 hours. Sepsis Labs: No results for input(s): PROCALCITON, LATICACIDVEN in the last 168 hours.  No results found for this or any previous visit (from the past 240 hour(s)).       Radiology Studies: No results found.      Scheduled Meds:  apixaban  5 mg Oral BID   divalproex  1,000 mg Oral Daily   escitalopram  20 mg Oral Daily   feeding supplement (GLUCERNA SHAKE)  237 mL Oral TID BM   insulin aspart  0-5 Units Subcutaneous QHS   insulin aspart  0-9 Units Subcutaneous TID WC   insulin detemir  10 Units  Subcutaneous BID   ketotifen  1 drop Both Eyes TID   melatonin  5 mg Oral QHS   multivitamin with minerals  1 tablet Oral Daily   polyethylene glycol  17 g Oral Daily   ramipril  5 mg Oral Daily   senna-docusate  1 tablet Oral QHS   thiamine  100 mg Oral Daily   vitamin B-12  1,000 mcg Oral Daily   Continuous Infusions:   LOS: 48 days   Time spent= 35 mins    Frazier Balfour Arsenio Loader, MD [facility]  If 7PM-7AM, please contact night-coverage  11/18/2020, 12:53 PM

## 2020-11-19 DIAGNOSIS — G9341 Metabolic encephalopathy: Secondary | ICD-10-CM | POA: Diagnosis not present

## 2020-11-19 LAB — GLUCOSE, CAPILLARY
Glucose-Capillary: 109 mg/dL — ABNORMAL HIGH (ref 70–99)
Glucose-Capillary: 140 mg/dL — ABNORMAL HIGH (ref 70–99)
Glucose-Capillary: 174 mg/dL — ABNORMAL HIGH (ref 70–99)
Glucose-Capillary: 168 mg/dL — ABNORMAL HIGH (ref 70–99)

## 2020-11-19 LAB — URINALYSIS, COMPLETE (UACMP) WITH MICROSCOPIC
Bacteria, UA: NONE SEEN
Bilirubin Urine: NEGATIVE
Glucose, UA: NEGATIVE mg/dL
Hgb urine dipstick: NEGATIVE
Ketones, ur: NEGATIVE mg/dL
Leukocytes,Ua: NEGATIVE
Nitrite: NEGATIVE
Protein, ur: NEGATIVE mg/dL
Specific Gravity, Urine: 1.017 (ref 1.005–1.030)
pH: 6 (ref 5.0–8.0)

## 2020-11-19 LAB — RESP PANEL BY RT-PCR (FLU A&B, COVID) ARPGX2
Influenza A by PCR: NEGATIVE
Influenza B by PCR: NEGATIVE
SARS Coronavirus 2 by RT PCR: NEGATIVE

## 2020-11-19 NOTE — Progress Notes (Signed)
PROGRESS NOTE    Daisy Watkins  [id number] DOB: 1932-10-16 DOA: 09/30/2020 PCP: Valerie Roys, DO   Brief Narrative:  87 year old with history of HTN, diabetes mellitus type 2, meningioma, A. fib, history of DVT on Eliquis, CAD admitted for agitation.  Family unable to take care of patient at home therefore unsafe discharge.  Will need memory care unit.  During the hospitalization she has had episodes of agitation and confusion.    Assessment & Plan:   Principal Problem:   Acute metabolic encephalopathy Active Problems:   Type 2 diabetes mellitus with hyperglycemia (HCC)   Coronary artery disease   Benign essential hypertension   Paroxysmal A-fib (HCC)   Anxiety, generalized   History of DVT (deep vein thrombosis)   Psychosis in elderly with behavioral disturbance (HCC)   AMS (altered mental status)   Meningioma (HCC)   Hypokalemia   Dysuria - Started yesterday evening.  Will check UA   Acute metabolic encephalopathy, resolved Mood disorder - Metabolic etiologies have been ruled out.  MRI was unremarkable.  Patient was seen by psychiatry, started on Depakote and escitalopram  History of meningioma - Follow-up outpatient  History of paroxysmal atrial fibrillation History of DVT - On Eliquis  Diabetes mellitus type 2, acceptable range - On Levemir 10 units daily.  Insulin sliding scale and Accu-Cheks  Essential hypertension - On ramipril   DVT prophylaxis: Eliquis Code Status: Full code Family Communication: States she will update her family  Status is: Inpatient  Remains inpatient appropriate because:Unsafe d/c plan  Dispo: The patient is from: Home              Anticipated d/c is to: SNF              Patient currently is medically stable for discharge.   Difficult to place patient yes       Nutritional status  Nutrition Problem: Inadequate oral intake Etiology: poor appetite  Signs/Symptoms: per patient/family report, meal completion <  50%  Interventions: Liberalize Diet, MVI, Glucerna shake  Body mass index is 27.86 kg/m.           Subjective: Reports of dysuria that started yesterday evening.  Remains afebrile no other complaints  Review of Systems Otherwise negative except as per HPI, including: General: Denies fever, chills, night sweats or unintended weight loss. Resp: Denies cough, wheezing, shortness of breath. Cardiac: Denies chest pain, palpitations, orthopnea, paroxysmal nocturnal dyspnea. GI: Denies abdominal pain, nausea, vomiting, diarrhea or constipation GU: Denies dysuria, frequency, hesitancy or incontinence MS: Denies muscle aches, joint pain or swelling Neuro: Denies headache, neurologic deficits (focal weakness, numbness, tingling), abnormal gait Psych: Denies anxiety, depression, SI/HI/AVH Skin: Denies new rashes or lesions ID: Denies sick contacts, exotic exposures, travel   Physical examination:  Constitutional: Not in acute distress Respiratory: Clear to auscultation bilaterally Cardiovascular: Normal sinus rhythm, no rubs Abdomen: Nontender nondistended good bowel sounds Musculoskeletal: No edema noted Skin: No rashes seen Neurologic: CN 2-12 grossly intact.  And nonfocal Psychiatric: Normal judgment and insight. Alert and oriented x 3. Normal mood.   Objective: Vitals:   11/18/20 0734 11/18/20 1157 11/18/20 1943 11/19/20 0545  BP: (!) 116/50 (!) 112/47 (!) 124/49 (!) 127/45  Pulse: (!) 47 (!) 45 (!) 57 (!) 58  Resp: '18 18 18 18  '$ Temp: (!) 97.4 F (36.3 C) 98.5 F (36.9 C) 98.1 F (36.7 C) 97.7 F (36.5 C)  TempSrc: Oral Oral    SpO2: 97% 98% 96% 97%  Weight:  Height:        Intake/Output Summary (Last 24 hours) at 11/19/2020 0730 Last data filed at 11/19/2020 0555 Gross per 24 hour  Intake --  Output 300 ml  Net -300 ml   Filed Weights   10/11/20 0601 10/23/20 1409 11/05/20 1401  Weight: 75.1 kg 76.1 kg 78.3 kg     Data Reviewed:   CBC: Recent  Labs  Lab 11/14/20 0647 11/17/20 0447  WBC 4.2 4.9  HGB 12.3 12.0  HCT 37.2 36.3  MCV 90.7 91.7  PLT 123* [id number]*   Basic Metabolic Panel: No results for input(s): NA, K, CL, CO2, GLUCOSE, BUN, CREATININE, CALCIUM, MG, PHOS in the last 168 hours. GFR: Estimated Creatinine Clearance: 53.3 mL/min (by C-G formula based on SCr of 0.69 mg/dL). Liver Function Tests: No results for input(s): AST, ALT, ALKPHOS, BILITOT, PROT, ALBUMIN in the last 168 hours. No results for input(s): LIPASE, AMYLASE in the last 168 hours. No results for input(s): AMMONIA in the last 168 hours. Coagulation Profile: No results for input(s): INR, PROTIME in the last 168 hours. Cardiac Enzymes: No results for input(s): CKTOTAL, CKMB, CKMBINDEX, TROPONINI in the last 168 hours. BNP (last 3 results) No results for input(s): PROBNP in the last 8760 hours. HbA1C: No results for input(s): HGBA1C in the last 72 hours. CBG: Recent Labs  Lab 11/17/20 2157 11/18/20 0734 11/18/20 1200 11/18/20 1732 11/18/20 2040  GLUCAP 203* 113* 147* 123* 157*   Lipid Profile: No results for input(s): CHOL, HDL, LDLCALC, TRIG, CHOLHDL, LDLDIRECT in the last 72 hours. Thyroid Function Tests: No results for input(s): TSH, T4TOTAL, FREET4, T3FREE, THYROIDAB in the last 72 hours. Anemia Panel: No results for input(s): VITAMINB12, FOLATE, FERRITIN, TIBC, IRON, RETICCTPCT in the last 72 hours. Sepsis Labs: No results for input(s): PROCALCITON, LATICACIDVEN in the last 168 hours.  No results found for this or any previous visit (from the past 240 hour(s)).       Radiology Studies: No results found.      Scheduled Meds:  apixaban  5 mg Oral BID   divalproex  1,000 mg Oral Daily   escitalopram  20 mg Oral Daily   feeding supplement (GLUCERNA SHAKE)  237 mL Oral TID BM   insulin aspart  0-5 Units Subcutaneous QHS   insulin aspart  0-9 Units Subcutaneous TID WC   insulin detemir  10 Units Subcutaneous BID   ketotifen  1  drop Both Eyes TID   melatonin  5 mg Oral QHS   multivitamin with minerals  1 tablet Oral Daily   polyethylene glycol  17 g Oral Daily   ramipril  5 mg Oral Daily   senna-docusate  1 tablet Oral QHS   thiamine  100 mg Oral Daily   vitamin B-12  1,000 mcg Oral Daily   Continuous Infusions:   LOS: 49 days   Time spent= 35 mins    Denisse Whitenack Arsenio Loader, MD [facility]  If 7PM-7AM, please contact night-coverage  11/19/2020, 7:30 AM

## 2020-11-19 NOTE — TOC Progression Note (Signed)
Transition of Care [facility]) - Progression Note    Patient Details  Name: Daisy Watkins MRN: [id number] Date of Birth: 07/02/1932  Transition of Care [facility]) CM/SW Contact  Shelbie Hutching, RN Phone Number: 11/19/2020, 10:59 AM  Clinical Narrative:     [facility] patient.  They can take patient tomorrow.  COVID test ordered for discharge tomorrow.  Patient's daughter [name] updated that SNF has been found.  [name] has concerns about her mother going so far away but explained that this is the only bed offer and her mother needs to be discharged.  [name] verbalizes understanding about need for patient to be discharged and agrees to [facility] [place].    Expected Discharge Plan: Skilled Nursing Facility Barriers to Discharge: Family Issues, Other (must enter comment) (Medicaid application pending-no ALF bed)  Expected Discharge Plan and Services Expected Discharge Plan: [place]   Discharge Planning Services: CM Consult   Living arrangements for the past 2 months: Single Family Home Expected Discharge Date: 10/03/20               DME Arranged: N/A DME Agency: NA       HH Arranged: NA           Social Determinants of Health (SDOH) Interventions    Readmission Risk Interventions No flowsheet data found.

## 2020-11-19 NOTE — Progress Notes (Signed)
Nutrition Follow-up  DOCUMENTATION CODES:  Not applicable  INTERVENTION:  Continue current diet as ordered  Glucerna Shake po TID, each supplement provides 220 kcal and 10 grams of protein Multivitamin with minerals daily Nursing to assist pt with meals New measured weight  NUTRITION DIAGNOSIS:  Inadequate oral intake related to poor appetite as evidenced by per patient/family report, meal completion < 50%.  GOAL:  Patient will meet greater than or equal to 90% of their needs  MONITOR:  PO intake, Supplement acceptance, Weight trends  REASON FOR ASSESSMENT:  Malnutrition Screening Tool    ASSESSMENT:  87 y.o. female brought to ED from home with AMS. Family reports that pt was agitated earlier in the day and then became unresponsive. PMH relevant for DM, GERD, HTN, HLD, Vitamin B12 and D deficiencies, and atrial fibrillation.  Pt remains medically stable at this time. Intake good and weight stable. Per CM note, pt has been accepted by facility and will likely dc tomorrow.   Average Meal Intake: 7/3-7/5: 0% intake x 1 recorded meal 7/6-7/14: 15% intake x 23 recorded meals (0-100%) 7/15-7/20: 33% intake x 3 recorded meals (25-50%) 7/21-7/26: 50% intake x 2 recorded meals 7/27-8/2: 92% intake x 3 recorded meals (75-100%) 8/3-8/15: limited meals recorded, 28% intake x 4 recorded meals 8/16-8/22: 88% intake x 4 recorded meals (50-100%)  Nutritionally Relevant Medications: Scheduled Meds:  feeding supplement (GLUCERNA SHAKE)  237 mL Oral TID BM   insulin aspart  0-5 Units Subcutaneous QHS   insulin aspart  0-9 Units Subcutaneous TID WC   insulin detemir  10 Units Subcutaneous BID   multivitamin with minerals  1 tablet Oral Daily   polyethylene glycol  17 g Oral Daily   senna-docusate  1 tablet Oral QHS   thiamine  100 mg Oral Daily   vitamin B-12  1,000 mcg Oral Daily   PRN Meds: alum & mag hydroxide-simeth, bisacodyl, mineral oil, ondansetron  Labs Reviewed: SBG ranges  from 109-157 mg/dL over the last 24 hours HgbA1c 10.2% (6/17)  NUTRITION - FOCUSED PHYSICAL EXAM: Flowsheet Row Most Recent Value  Orbital Region No depletion  Upper Arm Region No depletion  Thoracic and Lumbar Region No depletion  Buccal Region No depletion  Temple Region No depletion  Clavicle Bone Region No depletion  Clavicle and Acromion Bone Region No depletion  Scapular Bone Region No depletion  Dorsal Hand No depletion  Patellar Region No depletion  Anterior Thigh Region No depletion  Posterior Calf Region No depletion  Edema (RD Assessment) Mild  Hair Reviewed  Eyes Reviewed  Mouth Reviewed  Skin Reviewed  Nails Reviewed   Diet Order:   Diet Order             Diet Carb Modified Fluid consistency: Thin; Room service appropriate? Yes  Diet effective now                  EDUCATION NEEDS:  No education needs have been identified at this time  Skin:  Skin Assessment: Reviewed RN Assessment  Last BM:  8/22 - type 2  Height:  Ht Readings from Last 1 Encounters:  09/30/20 '5\' 6"'$  (1.676 m)   Weight:  Wt Readings from Last 1 Encounters:  11/05/20 78.3 kg   Ideal Body Weight:  59.1 kg  BMI:  Body mass index is 27.86 kg/m.  Estimated Nutritional Needs:  Kcal:  1600-1800 kcal/d Protein:  80-90 g/d Fluid:  1.8-2L/d  Ranell Patrick, RD, LDN Clinical Dietitian Pager on [place]

## 2020-11-19 NOTE — NC FL2 (Signed)
[place] LEVEL OF CARE SCREENING TOOL     IDENTIFICATION  Patient Name: Daisy Watkins Birthdate: May 12, 1932 Sex: female Admission Date (Current Location): 09/30/2020  [facility] [place] Number:  Engineering geologist and Address:  [facility], [address], [place], [place] [address]      Provider Number: [id number]  Attending Physician Name and Address:  Damita Lack, MD  Relative Name and Phone Number:  [name] (daughter) [phone]    Current Level of Care: Hospital Recommended Level of Care: [place], Memory Care Prior Approval Number:    Date Approved/Denied:   PASRR Number:    Discharge Plan: SNF    Current Diagnoses: Patient Active Problem List   Diagnosis Date Noted   Acute metabolic encephalopathy [id number]   Hypokalemia 09/30/2020   Malnutrition of moderate degree ([place]) 09/24/2020   Meningioma ([place]) 09/15/2020   Confusion 09/15/2020   Psychosis in elderly with behavioral disturbance ([place]) 09/14/2020   AMS (altered mental status) 09/14/2020   Shoulder impingement 05/01/2017   Insomnia 11/12/2016   DNR (do not resuscitate) 09/30/2016   Advance directive discussed with patient 09/30/2016   Allergic rhinitis 11/23/2015   DVT, lower extremity, recurrent ([place]) 12/26/2014   Anxiety, generalized 11/23/2014   Aortic atherosclerosis ([place]) 09/28/2014   Coronary artery disease 09/28/2014   Fatty liver 09/28/2014   Paroxysmal A-fib ([place]) 09/28/2014   Benign essential hypertension 09/26/2014   GERD (gastroesophageal reflux disease) 09/26/2014   History of DVT (deep vein thrombosis) 09/26/2014   Type 2 diabetes mellitus with hyperglycemia ([place]) 09/21/2014   Hyperlipidemia, mixed 09/21/2014   Postzoster neuralgia 09/21/2014    Orientation RESPIRATION BLADDER Height & Weight     Self, Place, Time  Normal Continent Weight: 78.3 kg Height:  '5\' 6"'$  (167.6 cm)  BEHAVIORAL SYMPTOMS/MOOD  NEUROLOGICAL BOWEL NUTRITION STATUS  Other (Comment) (memory impairment)   Continent  (Carb modified 1600-2000 cal per day- likes Diet Pepsi)  AMBULATORY STATUS COMMUNICATION OF NEEDS Skin   Supervision Verbally Normal                       Personal Care Assistance Level of Assistance  Bathing, Feeding, Dressing Bathing Assistance: Limited assistance Feeding assistance: Limited assistance Dressing Assistance: Limited assistance     Functional Limitations Info  Sight, Hearing, Speech Sight Info: Adequate Hearing Info: Adequate Speech Info: Adequate    SPECIAL CARE FACTORS FREQUENCY                       Contractures Contractures Info: Not present    Additional Factors Info  Code Status, Allergies Code Status Info: Full Allergies Info: Ciprofloxacin, Clindamycin, elemental sulfur, HCTZ, doxycycline           Current Medications (11/19/2020):  This is the current hospital active medication list Current Facility-Administered Medications  Medication Dose Route Frequency Provider Last Rate Last Admin   acetaminophen (TYLENOL) suppository 650 mg  650 mg Rectal Q6H PRN Ivor Costa, MD       acetaminophen (TYLENOL) tablet 650 mg  650 mg Oral Q6H PRN Ivor Costa, MD   650 mg at 11/17/20 0216   alum & mag hydroxide-simeth (MAALOX/MYLANTA) 200-200-20 MG/5ML suspension 30 mL  30 mL Oral Q6H PRN Sharen Hones, MD   30 mL at 11/01/20 0842   apixaban (ELIQUIS) tablet 5 mg  5 mg Oral BID Ivor Costa, MD   5 mg at 11/19/20 0955   bisacodyl (  DULCOLAX) suppository 10 mg  10 mg Rectal Daily PRN Lorella Nimrod, MD   10 mg at 11/12/20 1541   divalproex (DEPAKOTE ER) 24 hr tablet 1,000 mg  1,000 mg Oral Daily George Hugh, MD   1,000 mg at 11/19/20 0955   escitalopram (LEXAPRO) tablet 20 mg  20 mg Oral Daily George Hugh, MD   20 mg at 11/19/20 0955   feeding supplement (GLUCERNA SHAKE) (GLUCERNA SHAKE) liquid 237 mL  237 mL Oral TID BM Elodia Florence., MD   237 mL at 11/19/20  0959   hydrALAZINE (APRESOLINE) tablet 25 mg  25 mg Oral Q4H PRN Dwyane Dee, MD   25 mg at 10/13/20 2129   insulin aspart (novoLOG) injection 0-5 Units  0-5 Units Subcutaneous QHS Ivor Costa, MD   2 Units at 11/17/20 2206   insulin aspart (novoLOG) injection 0-9 Units  0-9 Units Subcutaneous TID WC Lorella Nimrod, MD   1 Units at 11/18/20 1735   insulin detemir (LEVEMIR) injection 10 Units  10 Units Subcutaneous BID Lorella Nimrod, MD   10 Units at 11/19/20 0955   ketotifen (ZADITOR) 0.025 % ophthalmic solution 1 drop  1 drop Both Eyes TID Sharen Hones, MD   1 drop at 11/19/20 [id number]   LORazepam (ATIVAN) tablet 0.5 mg  0.5 mg Oral BID BM & HS PRN Mansy, Jan A, MD   0.5 mg at 11/13/20 2038   melatonin tablet 5 mg  5 mg Oral QHS Athena Masse, MD   5 mg at 11/18/20 2103   mineral oil enema 1 enema  1 enema Rectal Daily PRN Terrilee Croak, MD   1 enema at 11/12/20 1736   multivitamin with minerals tablet 1 tablet  1 tablet Oral Daily Elodia Florence., MD   1 tablet at 11/19/20 0955   ondansetron (ZOFRAN) injection 4 mg  4 mg Intravenous Q8H PRN Ivor Costa, MD       polyethylene glycol (MIRALAX / GLYCOLAX) packet 17 g  17 g Oral Daily Dahal, Binaya, MD   17 g at 11/19/20 0956   ramipril (ALTACE) capsule 5 mg  5 mg Oral Daily Ivor Costa, MD   5 mg at 11/19/20 [id number]   senna-docusate (Senokot-S) tablet 1 tablet  1 tablet Oral QHS Terrilee Croak, MD   1 tablet at 11/18/20 2104   thiamine tablet 100 mg  100 mg Oral Daily Rito Ehrlich A, RPH   100 mg at 11/19/20 [id number]   traZODone (DESYREL) tablet 25 mg  25 mg Oral QHS PRN Mansy, Jan A, MD   25 mg at 11/17/20 2207   vitamin B-12 (CYANOCOBALAMIN) tablet 1,000 mcg  1,000 mcg Oral Daily Elodia Florence., MD   1,000 mcg at 11/19/20 [id number]     Discharge Medications: Please see discharge summary for a list of discharge medications.  Relevant Imaging Results:  Relevant Lab Results:   Additional Information SS# [id number]  Shelbie Hutching,  RN

## 2020-11-19 NOTE — Care Management Important Message (Signed)
Important Message  Patient Details  Name: Daisy Watkins MRN: [id number] Date of Birth: [birth year]   Medicare Important Message Given:  Yes  Daughter returned message.  Medicare IM reviewed.  Per daughter, will be in the room after 5pm.  Requested copy of Medicare IM be left in room for reference.  Copy left on windowsill.   Dannette Barbara 11/19/2020, 4:21 PM

## 2020-11-19 NOTE — Care Management Important Message (Signed)
Important Message  Patient Details  Name: Daisy Watkins MRN: [id number] Date of Birth: January 18, 1933   Medicare Important Message Given:  Other (see comment)  Attempted to review Medicare IM. Left message for [name], daughter, at [phone].  Encouraged callback, will attempt at later time.   Dannette Barbara 11/19/2020, 2:16 PM

## 2020-11-20 DIAGNOSIS — Z7401 Bed confinement status: Secondary | ICD-10-CM | POA: Diagnosis not present

## 2020-11-20 DIAGNOSIS — E119 Type 2 diabetes mellitus without complications: Secondary | ICD-10-CM | POA: Diagnosis not present

## 2020-11-20 DIAGNOSIS — G9341 Metabolic encephalopathy: Secondary | ICD-10-CM | POA: Diagnosis not present

## 2020-11-20 DIAGNOSIS — R4182 Altered mental status, unspecified: Secondary | ICD-10-CM | POA: Diagnosis not present

## 2020-11-20 DIAGNOSIS — I10 Essential (primary) hypertension: Secondary | ICD-10-CM | POA: Diagnosis not present

## 2020-11-20 DIAGNOSIS — I959 Hypotension, unspecified: Secondary | ICD-10-CM | POA: Diagnosis not present

## 2020-11-20 DIAGNOSIS — I482 Chronic atrial fibrillation, unspecified: Secondary | ICD-10-CM | POA: Diagnosis not present

## 2020-11-20 DIAGNOSIS — E1165 Type 2 diabetes mellitus with hyperglycemia: Secondary | ICD-10-CM | POA: Diagnosis not present

## 2020-11-20 DIAGNOSIS — F0391 Unspecified dementia with behavioral disturbance: Secondary | ICD-10-CM | POA: Diagnosis not present

## 2020-11-20 DIAGNOSIS — E440 Moderate protein-calorie malnutrition: Secondary | ICD-10-CM | POA: Diagnosis not present

## 2020-11-20 DIAGNOSIS — F419 Anxiety disorder, unspecified: Secondary | ICD-10-CM | POA: Diagnosis not present

## 2020-11-20 DIAGNOSIS — R404 Transient alteration of awareness: Secondary | ICD-10-CM | POA: Diagnosis not present

## 2020-11-20 LAB — GLUCOSE, CAPILLARY
Glucose-Capillary: 171 mg/dL — ABNORMAL HIGH (ref 70–99)
Glucose-Capillary: 210 mg/dL — ABNORMAL HIGH (ref 70–99)

## 2020-11-20 MED ORDER — TRAZODONE HCL 50 MG PO TABS: 25.0000 mg | ORAL_TABLET | Freq: Every evening | ORAL | Status: AC | PRN

## 2020-11-20 MED ORDER — THIAMINE HCL 100 MG PO TABS: 100.0000 mg | ORAL_TABLET | Freq: Every day | ORAL | Status: AC

## 2020-11-20 NOTE — Progress Notes (Signed)
Report called to Jonelle Sidle, Therapist, sports at [facility].

## 2020-11-20 NOTE — TOC Transition Note (Signed)
Transition of Care [facility]) - CM/SW Discharge Note   Patient Details  Name: Daisy Watkins MRN: [id number] Date of Birth: [birth year]  [facility] CM/SW Contact:  Shelbie Hutching, RN Phone Number: 11/20/2020, 12:15 PM   Clinical Narrative:    Patient will discharge to [facility].  Patient going to room 204, bedside RN has called report.  [facility] has availability at 1630 for pick up, [facility] also called but they may not be able to transport patient due to back log and only one truck.   Patient's daughter, Daisy Watkins, notified of discharge and will be going to the facility this afternoon to take patient belongings.     Final next level of care: [place] Barriers to Discharge: Barriers Resolved   Patient Goals and CMS Choice Patient states their goals for this hospitalization and ongoing recovery are:: Patient going to [facility] CMS Medicare.gov Compare Post Acute Care list provided to:: Patient Represenative (must comment) Choice offered to / list presented to : Adult Children  Discharge Placement              Patient chooses bed at: [facility] Patient to be transferred to facility by: [facility] Name of family member notified: Daisy Watkins Patient and family notified of of transfer: 11/20/20  Discharge Plan and Services   Discharge Planning Services: CM Consult            DME Arranged: N/A DME Agency: NA       HH Arranged: NA          Social Determinants of Health (SDOH) Interventions     Readmission Risk Interventions No flowsheet data found.

## 2020-11-20 NOTE — Discharge Summary (Signed)
Physician Discharge Summary  Daisy Watkins [id number] DOB: 1932/12/15 DOA: 09/30/2020  PCP: Valerie Roys, DO  Admit date: 09/30/2020 Discharge date: 11/20/2020  Admitted From: Home Disposition: SNF  Recommendations for Outpatient Follow-up:  Follow up with PCP in 1-2 weeks Please obtain BMP/CBC in one week your next doctors visit.  Follow-up outpatient PCP and radiation oncology   Discharge Condition: Stable CODE STATUS: Full code Diet recommendation: Diabetic  Brief/Interim Summary: 87 y.o. F with HTN, diabetes, meningioma, Afib/hx DVT on ELiquis, and CAD who presented with change in mentation.   Patient lives with her son and daughter-in-law for many years, no formal diagnosis of dementia, but had been increasingly limited with IADLs in recent years per daughter.  Then several months ago, the patient's son died, and her condition has deteriorated.   Then, around the time of admission, patient's daughter encountered her agitated and confused.  She had an episode where she slid off the commode, and was confused/had decreased responsiveness, but no focal neurological deficits.  There was concern that she was "not taking her medications properly and not taking care of herself" and so the patient was IVC and brought to the ER.  Metabolic etiologies have been ruled out.  MRI was unremarkable.  Patient was seen by psychiatry, started on Depakote and escitalopram.  After prolonged stay in the hospital awaiting placement, she has a bed today to be discharged.  Acute metabolic encephalopathy Mood disorder - Metabolic etiologies have been ruled out.  MRI was unremarkable.  Patient was seen by psychiatry, started on Depakote and escitalopram  History of meningioma - Follow-up outpatient.  Seen by neurosurgery, recommend outpatient follow-up with radiation oncology  History of paroxysmal atrial fibrillation History of DVT - On Eliquis   Diabetes mellitus type 2 - On Levemir 10 units  daily.  Insulin sliding scale and Accu-Cheks  Essential hypertension - On ramipril    Body mass index is 29.71 kg/m.         Discharge Diagnoses:  Principal Problem:   Acute metabolic encephalopathy Active Problems:   Type 2 diabetes mellitus with hyperglycemia (HCC)   Coronary artery disease   Benign essential hypertension   Paroxysmal A-fib (HCC)   Anxiety, generalized   History of DVT (deep vein thrombosis)   Psychosis in elderly with behavioral disturbance (HCC)   AMS (altered mental status)   Meningioma (HCC)   Hypokalemia      Consultations: Neurology Psychiatry Neurosurgery  Subjective: Feels great no complaints  Discharge Exam: Vitals:   11/20/20 0445 11/20/20 0734  BP: (!) 115/47 127/81  Pulse: 62 63  Resp: 16 17  Temp: 97.7 F (36.5 C) 97.9 F (36.6 C)  SpO2: 96% 97%   Vitals:   11/19/20 2055 11/19/20 2350 11/20/20 0445 11/20/20 0734  BP: (!) 128/52 (!) 118/44 (!) 115/47 127/81  Pulse: (!) 54 (!) 56 62 63  Resp: '16 16 16 17  '$ Temp: 98.1 F (36.7 C) 97.8 F (36.6 C) 97.7 F (36.5 C) 97.9 F (36.6 C)  TempSrc:      SpO2: 95% 98% 96% 97%  Weight:      Height:        General: Pt is alert, awake, not in acute distress Cardiovascular: RRR, S1/S2 +, no rubs, no gallops Respiratory: CTA bilaterally, no wheezing, no rhonchi Abdominal: Soft, NT, ND, bowel sounds + Extremities: no edema, no cyanosis  Discharge Instructions  Discharge Instructions     Increase activity slowly   Complete by: As directed  Allergies as of 11/20/2020       Reactions   Ciprofloxacin Palpitations   Severe Headache   Clindamycin/lincomycin Nausea Only   Made her head hurt   Elemental Sulfur Itching   Hctz [hydrochlorothiazide] Other (See Comments)   Abdominal pain   Doxycycline Hyclate Palpitations        Medication List     STOP taking these medications    LORazepam 0.5 MG tablet Commonly known as: ATIVAN       TAKE these  medications    acetaminophen 500 MG tablet Commonly known as: TYLENOL Take 500 mg every 6 (six) hours as needed by mouth. As needed   apixaban 5 MG Tabs tablet Commonly known as: ELIQUIS Take 1 tablet (5 mg total) by mouth 2 (two) times daily.   cyanocobalamin 1000 MCG tablet Take 1 tablet (1,000 mcg total) by mouth daily.   divalproex 250 MG 24 hr tablet Commonly known as: DEPAKOTE ER Take 1 tablet (250 mg total) by mouth daily.   escitalopram 5 MG tablet Commonly known as: LEXAPRO Take 1 tablet (5 mg total) by mouth daily.   feeding supplement (GLUCERNA SHAKE) Liqd Take 237 mLs by mouth 3 (three) times daily between meals.   multivitamin with minerals Tabs tablet Take 1 tablet by mouth daily.   ramipril 5 MG capsule Commonly known as: ALTACE Take 1 capsule (5 mg total) by mouth daily.   Semaglutide (1 MG/DOSE) 4 MG/3ML Sopn Inject 0.25 mg into the skin once a week for 28 days, THEN 0.5 mg once a week for 28 days, THEN 1 mg once a week for 28 days. Start taking on: September 24, 2020   thiamine 100 MG tablet Take 1 tablet (100 mg total) by mouth daily. Start taking on: November 21, 2020   traZODone 50 MG tablet Commonly known as: DESYREL Take 0.5 tablets (25 mg total) by mouth at bedtime as needed for sleep.       ASK your doctor about these medications    Semaglutide (1 MG/DOSE) 4 MG/3ML Sopn Inject 1 mg into the skin once a week. Ask about: Should I take this medication?        Contact information for follow-up providers     Johnson, Megan P, DO. Go on 10/17/2020.   Specialty: Family Medicine Why: '@10'$ :00am Contact information: [place] [address] [phone]         [facility] EMERGENCY DEPARTMENT Follow up.   Specialty: Emergency Medicine Contact information: [place] [id number] [place] [phone]             Contact information for after-discharge care      [facility] .   Service: Skilled Nursing Contact information: [place] [address] [phone]                    Allergies  Allergen Reactions   Ciprofloxacin Palpitations    Severe Headache   Clindamycin/Lincomycin Nausea Only    Made her head hurt   Elemental Sulfur Itching   Hctz [Hydrochlorothiazide] Other (See Comments)    Abdominal pain   Doxycycline Hyclate Palpitations    You were cared for by a hospitalist during your hospital stay. If you have any questions about your discharge medications or the care you received while you were in the hospital after you are discharged, you can call the unit and asked  to speak with the hospitalist on call if the hospitalist that took care of you is not available. Once you are discharged, your primary care physician will handle any further medical issues. Please note that no refills for any discharge medications will be authorized once you are discharged, as it is imperative that you return to your primary care physician (or establish a relationship with a primary care physician if you do not have one) for your aftercare needs so that they can reassess your need for medications and monitor your lab values.   Procedures/Studies: No results found.   The results of significant diagnostics from this hospitalization (including imaging, microbiology, ancillary and laboratory) are listed below for reference.     Microbiology: Recent Results (from the past 240 hour(s))  Resp Panel by RT-PCR (Flu A&B, Covid) Nasopharyngeal Swab     Status: None   Collection Time: 11/19/20  2:35 PM   Specimen: Nasopharyngeal Swab; Nasopharyngeal(NP) swabs in vial transport medium  Result Value Ref Range Status   SARS Coronavirus 2 by RT PCR NEGATIVE NEGATIVE Final    Comment: (NOTE) SARS-CoV-2 target nucleic acids are NOT DETECTED.  The SARS-CoV-2 RNA is generally  detectable in upper respiratory specimens during the acute phase of infection. The lowest concentration of SARS-CoV-2 viral copies this assay can detect is 138 copies/mL. A negative result does not preclude SARS-Cov-2 infection and should not be used as the sole basis for treatment or other patient management decisions. A negative result may occur with  improper specimen collection/handling, submission of specimen other than nasopharyngeal swab, presence of viral mutation(s) within the areas targeted by this assay, and inadequate number of viral copies(<138 copies/mL). A negative result must be combined with clinical observations, patient history, and epidemiological information. The expected result is Negative.  Fact Sheet for Patients:  EntrepreneurPulse.com.au  Fact Sheet for Healthcare Providers:  IncredibleEmployment.be  This test is no t yet approved or cleared by the [place] FDA and  has been authorized for detection and/or diagnosis of SARS-CoV-2 by FDA under an Emergency Use Authorization (EUA). This EUA will remain  in effect (meaning this test can be used) for the duration of the COVID-19 declaration under Section 564(b)(1) of the Act, 21 U.S.C.section 360bbb-3(b)(1), unless the authorization is terminated  or revoked sooner.       Influenza A by PCR NEGATIVE NEGATIVE Final   Influenza B by PCR NEGATIVE NEGATIVE Final    Comment: (NOTE) The Xpert Xpress SARS-CoV-2/FLU/RSV plus assay is intended as an aid in the diagnosis of influenza from Nasopharyngeal swab specimens and should not be used as a sole basis for treatment. Nasal washings and aspirates are unacceptable for Xpert Xpress SARS-CoV-2/FLU/RSV testing.  Fact Sheet for Patients: EntrepreneurPulse.com.au  Fact Sheet for Healthcare Providers: IncredibleEmployment.be  This test is not yet approved or cleared by the [place] FDA  and has been authorized for detection and/or diagnosis of SARS-CoV-2 by FDA under an Emergency Use Authorization (EUA). This EUA will remain in effect (meaning this test can be used) for the duration of the COVID-19 declaration under Section 564(b)(1) of the Act, 21 U.S.C. section 360bbb-3(b)(1), unless the authorization is terminated or revoked.  Performed at [facility], [address] [place], [place] [address]      Labs: BNP (last 3 results) No results for input(s): BNP in the last 8760 hours. Basic Metabolic Panel: No results for input(s): NA, K, CL, CO2, GLUCOSE, BUN, CREATININE, CALCIUM, MG, PHOS in the last 168 hours. Liver  Function Tests: No results for input(s): AST, ALT, ALKPHOS, BILITOT, PROT, ALBUMIN in the last 168 hours. No results for input(s): LIPASE, AMYLASE in the last 168 hours. No results for input(s): AMMONIA in the last 168 hours. CBC: Recent Labs  Lab 11/14/20 0647 11/17/20 0447  WBC 4.2 4.9  HGB 12.3 12.0  HCT 37.2 36.3  MCV 90.7 91.7  PLT 123* 140*   Cardiac Enzymes: No results for input(s): CKTOTAL, CKMB, CKMBINDEX, TROPONINI in the last 168 hours. BNP: Invalid input(s): POCBNP CBG: Recent Labs  Lab 11/19/20 0813 11/19/20 1154 11/19/20 1639 11/19/20 2054 11/20/20 0737  GLUCAP 109* 140* 174* 168* 171*   D-Dimer No results for input(s): DDIMER in the last 72 hours. Hgb A1c No results for input(s): HGBA1C in the last 72 hours. Lipid Profile No results for input(s): CHOL, HDL, LDLCALC, TRIG, CHOLHDL, LDLDIRECT in the last 72 hours. Thyroid function studies No results for input(s): TSH, T4TOTAL, T3FREE, THYROIDAB in the last 72 hours.  Invalid input(s): FREET3 Anemia work up No results for input(s): VITAMINB12, FOLATE, FERRITIN, TIBC, IRON, RETICCTPCT in the last 72 hours. Urinalysis    Component Value Date/Time   COLORURINE YELLOW (A) 11/19/2020 1435   APPEARANCEUR HAZY (A) 11/19/2020 1435   APPEARANCEUR Hazy (A)  03/21/2020 1613   LABSPEC 1.017 11/19/2020 1435   LABSPEC 1.024 01/27/2014 1511   PHURINE 6.0 11/19/2020 1435   GLUCOSEU NEGATIVE 11/19/2020 1435   GLUCOSEU >=500 01/27/2014 1511   HGBUR NEGATIVE 11/19/2020 1435   BILIRUBINUR NEGATIVE 11/19/2020 1435   BILIRUBINUR Negative 03/21/2020 1613   BILIRUBINUR Negative 01/27/2014 1511   KETONESUR NEGATIVE 11/19/2020 1435   PROTEINUR NEGATIVE 11/19/2020 1435   NITRITE NEGATIVE 11/19/2020 1435   LEUKOCYTESUR NEGATIVE 11/19/2020 1435   LEUKOCYTESUR Negative 01/27/2014 1511   Sepsis Labs Invalid input(s): PROCALCITONIN,  WBC,  LACTICIDVEN Microbiology Recent Results (from the past 240 hour(s))  Resp Panel by RT-PCR (Flu A&B, Covid) Nasopharyngeal Swab     Status: None   Collection Time: 11/19/20  2:35 PM   Specimen: Nasopharyngeal Swab; Nasopharyngeal(NP) swabs in vial transport medium  Result Value Ref Range Status   SARS Coronavirus 2 by RT PCR NEGATIVE NEGATIVE Final    Comment: (NOTE) SARS-CoV-2 target nucleic acids are NOT DETECTED.  The SARS-CoV-2 RNA is generally detectable in upper respiratory specimens during the acute phase of infection. The lowest concentration of SARS-CoV-2 viral copies this assay can detect is 138 copies/mL. A negative result does not preclude SARS-Cov-2 infection and should not be used as the sole basis for treatment or other patient management decisions. A negative result may occur with  improper specimen collection/handling, submission of specimen other than nasopharyngeal swab, presence of viral mutation(s) within the areas targeted by this assay, and inadequate number of viral copies(<138 copies/mL). A negative result must be combined with clinical observations, patient history, and epidemiological information. The expected result is Negative.  Fact Sheet for Patients:  EntrepreneurPulse.com.au  Fact Sheet for Healthcare Providers:  IncredibleEmployment.be  This  test is no t yet approved or cleared by the [place] FDA and  has been authorized for detection and/or diagnosis of SARS-CoV-2 by FDA under an Emergency Use Authorization (EUA). This EUA will remain  in effect (meaning this test can be used) for the duration of the COVID-19 declaration under Section 564(b)(1) of the Act, 21 U.S.C.section 360bbb-3(b)(1), unless the authorization is terminated  or revoked sooner.       Influenza A by PCR NEGATIVE NEGATIVE Final   Influenza B  by PCR NEGATIVE NEGATIVE Final    Comment: (NOTE) The Xpert Xpress SARS-CoV-2/FLU/RSV plus assay is intended as an aid in the diagnosis of influenza from Nasopharyngeal swab specimens and should not be used as a sole basis for treatment. Nasal washings and aspirates are unacceptable for Xpert Xpress SARS-CoV-2/FLU/RSV testing.  Fact Sheet for Patients: EntrepreneurPulse.com.au  Fact Sheet for Healthcare Providers: IncredibleEmployment.be  This test is not yet approved or cleared by the [place] FDA and has been authorized for detection and/or diagnosis of SARS-CoV-2 by FDA under an Emergency Use Authorization (EUA). This EUA will remain in effect (meaning this test can be used) for the duration of the COVID-19 declaration under Section 564(b)(1) of the Act, 21 U.S.C. section 360bbb-3(b)(1), unless the authorization is terminated or revoked.  Performed at [facility], [address] [place], [place] [address]      Time coordinating discharge:  I have spent 35 minutes face to face with the patient and on the ward discussing the patients care, assessment, plan and disposition with other care givers. >50% of the time was devoted counseling the patient about the risks and benefits of treatment/Discharge disposition and coordinating care.   SIGNED:   Damita Lack, MD  [facility] 11/20/2020, 10:55 AM   If 7PM-7AM, please contact  night-coverage

## 2020-12-21 DIAGNOSIS — E118 Type 2 diabetes mellitus with unspecified complications: Secondary | ICD-10-CM | POA: Diagnosis not present

## 2020-12-22 DIAGNOSIS — E119 Type 2 diabetes mellitus without complications: Secondary | ICD-10-CM | POA: Diagnosis not present

## 2020-12-31 DIAGNOSIS — G3184 Mild cognitive impairment, so stated: Secondary | ICD-10-CM | POA: Diagnosis not present

## 2020-12-31 DIAGNOSIS — F419 Anxiety disorder, unspecified: Secondary | ICD-10-CM | POA: Diagnosis not present

## 2020-12-31 DIAGNOSIS — F321 Major depressive disorder, single episode, moderate: Secondary | ICD-10-CM | POA: Diagnosis not present

## 2021-01-01 DIAGNOSIS — G4089 Other seizures: Secondary | ICD-10-CM | POA: Diagnosis not present

## 2021-01-04 DIAGNOSIS — E119 Type 2 diabetes mellitus without complications: Secondary | ICD-10-CM | POA: Diagnosis not present

## 2021-01-08 DIAGNOSIS — H18413 Arcus senilis, bilateral: Secondary | ICD-10-CM | POA: Diagnosis not present

## 2021-01-08 DIAGNOSIS — E119 Type 2 diabetes mellitus without complications: Secondary | ICD-10-CM | POA: Diagnosis not present

## 2021-01-08 DIAGNOSIS — H2513 Age-related nuclear cataract, bilateral: Secondary | ICD-10-CM | POA: Diagnosis not present

## 2021-01-08 DIAGNOSIS — H524 Presbyopia: Secondary | ICD-10-CM | POA: Diagnosis not present

## 2021-01-08 DIAGNOSIS — H40013 Open angle with borderline findings, low risk, bilateral: Secondary | ICD-10-CM | POA: Diagnosis not present

## 2021-01-11 DIAGNOSIS — H524 Presbyopia: Secondary | ICD-10-CM | POA: Diagnosis not present

## 2021-01-31 DIAGNOSIS — H9313 Tinnitus, bilateral: Secondary | ICD-10-CM | POA: Diagnosis not present

## 2021-01-31 DIAGNOSIS — H903 Sensorineural hearing loss, bilateral: Secondary | ICD-10-CM | POA: Diagnosis not present

## 2021-04-10 DIAGNOSIS — H401134 Primary open-angle glaucoma, bilateral, indeterminate stage: Secondary | ICD-10-CM | POA: Diagnosis not present

## 2021-04-10 DIAGNOSIS — H2513 Age-related nuclear cataract, bilateral: Secondary | ICD-10-CM | POA: Diagnosis not present

## 2021-04-10 DIAGNOSIS — H18413 Arcus senilis, bilateral: Secondary | ICD-10-CM | POA: Diagnosis not present

## 2021-04-24 DIAGNOSIS — L603 Nail dystrophy: Secondary | ICD-10-CM | POA: Diagnosis not present

## 2021-04-24 DIAGNOSIS — R600 Localized edema: Secondary | ICD-10-CM | POA: Diagnosis not present

## 2021-04-24 DIAGNOSIS — R2689 Other abnormalities of gait and mobility: Secondary | ICD-10-CM | POA: Diagnosis not present

## 2021-04-24 DIAGNOSIS — Z741 Need for assistance with personal care: Secondary | ICD-10-CM | POA: Diagnosis not present

## 2021-04-24 DIAGNOSIS — E1159 Type 2 diabetes mellitus with other circulatory complications: Secondary | ICD-10-CM | POA: Diagnosis not present

## 2021-04-24 DIAGNOSIS — M6281 Muscle weakness (generalized): Secondary | ICD-10-CM | POA: Diagnosis not present

## 2021-04-24 DIAGNOSIS — I7091 Generalized atherosclerosis: Secondary | ICD-10-CM | POA: Diagnosis not present

## 2021-04-24 DIAGNOSIS — Z9181 History of falling: Secondary | ICD-10-CM | POA: Diagnosis not present

## 2021-04-25 DIAGNOSIS — M47814 Spondylosis without myelopathy or radiculopathy, thoracic region: Secondary | ICD-10-CM | POA: Diagnosis not present

## 2021-04-25 DIAGNOSIS — M47812 Spondylosis without myelopathy or radiculopathy, cervical region: Secondary | ICD-10-CM | POA: Diagnosis not present

## 2021-04-25 DIAGNOSIS — M6281 Muscle weakness (generalized): Secondary | ICD-10-CM | POA: Diagnosis not present

## 2021-04-25 DIAGNOSIS — Z741 Need for assistance with personal care: Secondary | ICD-10-CM | POA: Diagnosis not present

## 2021-04-25 DIAGNOSIS — R2689 Other abnormalities of gait and mobility: Secondary | ICD-10-CM | POA: Diagnosis not present

## 2021-04-25 DIAGNOSIS — Z9181 History of falling: Secondary | ICD-10-CM | POA: Diagnosis not present

## 2021-04-25 DIAGNOSIS — M5136 Other intervertebral disc degeneration, lumbar region: Secondary | ICD-10-CM | POA: Diagnosis not present

## 2021-04-25 DIAGNOSIS — M47818 Spondylosis without myelopathy or radiculopathy, sacral and sacrococcygeal region: Secondary | ICD-10-CM | POA: Diagnosis not present

## 2021-04-26 DIAGNOSIS — Z9181 History of falling: Secondary | ICD-10-CM | POA: Diagnosis not present

## 2021-04-26 DIAGNOSIS — Z741 Need for assistance with personal care: Secondary | ICD-10-CM | POA: Diagnosis not present

## 2021-04-26 DIAGNOSIS — R2689 Other abnormalities of gait and mobility: Secondary | ICD-10-CM | POA: Diagnosis not present

## 2021-04-26 DIAGNOSIS — M6281 Muscle weakness (generalized): Secondary | ICD-10-CM | POA: Diagnosis not present

## 2021-04-27 DIAGNOSIS — Z9181 History of falling: Secondary | ICD-10-CM | POA: Diagnosis not present

## 2021-04-27 DIAGNOSIS — M6281 Muscle weakness (generalized): Secondary | ICD-10-CM | POA: Diagnosis not present

## 2021-04-27 DIAGNOSIS — Z741 Need for assistance with personal care: Secondary | ICD-10-CM | POA: Diagnosis not present

## 2021-04-27 DIAGNOSIS — R2689 Other abnormalities of gait and mobility: Secondary | ICD-10-CM | POA: Diagnosis not present

## 2021-04-29 DIAGNOSIS — Z9181 History of falling: Secondary | ICD-10-CM | POA: Diagnosis not present

## 2021-04-29 DIAGNOSIS — Z741 Need for assistance with personal care: Secondary | ICD-10-CM | POA: Diagnosis not present

## 2021-04-29 DIAGNOSIS — R2689 Other abnormalities of gait and mobility: Secondary | ICD-10-CM | POA: Diagnosis not present

## 2021-04-29 DIAGNOSIS — M6281 Muscle weakness (generalized): Secondary | ICD-10-CM | POA: Diagnosis not present

## 2021-04-30 DIAGNOSIS — Z741 Need for assistance with personal care: Secondary | ICD-10-CM | POA: Diagnosis not present

## 2021-04-30 DIAGNOSIS — M6281 Muscle weakness (generalized): Secondary | ICD-10-CM | POA: Diagnosis not present

## 2021-04-30 DIAGNOSIS — R2689 Other abnormalities of gait and mobility: Secondary | ICD-10-CM | POA: Diagnosis not present

## 2021-04-30 DIAGNOSIS — Z9181 History of falling: Secondary | ICD-10-CM | POA: Diagnosis not present

## 2021-05-01 DIAGNOSIS — Z9181 History of falling: Secondary | ICD-10-CM | POA: Diagnosis not present

## 2021-05-01 DIAGNOSIS — R2689 Other abnormalities of gait and mobility: Secondary | ICD-10-CM | POA: Diagnosis not present

## 2021-05-01 DIAGNOSIS — M6281 Muscle weakness (generalized): Secondary | ICD-10-CM | POA: Diagnosis not present

## 2021-05-01 DIAGNOSIS — Z741 Need for assistance with personal care: Secondary | ICD-10-CM | POA: Diagnosis not present

## 2021-05-02 DIAGNOSIS — M6281 Muscle weakness (generalized): Secondary | ICD-10-CM | POA: Diagnosis not present

## 2021-05-02 DIAGNOSIS — Z9181 History of falling: Secondary | ICD-10-CM | POA: Diagnosis not present

## 2021-05-02 DIAGNOSIS — R2689 Other abnormalities of gait and mobility: Secondary | ICD-10-CM | POA: Diagnosis not present

## 2021-05-02 DIAGNOSIS — Z741 Need for assistance with personal care: Secondary | ICD-10-CM | POA: Diagnosis not present

## 2021-05-03 DIAGNOSIS — M6281 Muscle weakness (generalized): Secondary | ICD-10-CM | POA: Diagnosis not present

## 2021-05-03 DIAGNOSIS — Z9181 History of falling: Secondary | ICD-10-CM | POA: Diagnosis not present

## 2021-05-03 DIAGNOSIS — R2689 Other abnormalities of gait and mobility: Secondary | ICD-10-CM | POA: Diagnosis not present

## 2021-05-03 DIAGNOSIS — Z741 Need for assistance with personal care: Secondary | ICD-10-CM | POA: Diagnosis not present

## 2021-05-04 DIAGNOSIS — M6281 Muscle weakness (generalized): Secondary | ICD-10-CM | POA: Diagnosis not present

## 2021-05-04 DIAGNOSIS — R2689 Other abnormalities of gait and mobility: Secondary | ICD-10-CM | POA: Diagnosis not present

## 2021-05-04 DIAGNOSIS — Z741 Need for assistance with personal care: Secondary | ICD-10-CM | POA: Diagnosis not present

## 2021-05-04 DIAGNOSIS — Z9181 History of falling: Secondary | ICD-10-CM | POA: Diagnosis not present

## 2021-05-06 DIAGNOSIS — R2689 Other abnormalities of gait and mobility: Secondary | ICD-10-CM | POA: Diagnosis not present

## 2021-05-06 DIAGNOSIS — M6281 Muscle weakness (generalized): Secondary | ICD-10-CM | POA: Diagnosis not present

## 2021-05-06 DIAGNOSIS — Z741 Need for assistance with personal care: Secondary | ICD-10-CM | POA: Diagnosis not present

## 2021-05-06 DIAGNOSIS — Z9181 History of falling: Secondary | ICD-10-CM | POA: Diagnosis not present

## 2021-05-07 DIAGNOSIS — Z9181 History of falling: Secondary | ICD-10-CM | POA: Diagnosis not present

## 2021-05-07 DIAGNOSIS — R2689 Other abnormalities of gait and mobility: Secondary | ICD-10-CM | POA: Diagnosis not present

## 2021-05-07 DIAGNOSIS — M6281 Muscle weakness (generalized): Secondary | ICD-10-CM | POA: Diagnosis not present

## 2021-05-07 DIAGNOSIS — Z741 Need for assistance with personal care: Secondary | ICD-10-CM | POA: Diagnosis not present

## 2021-05-08 DIAGNOSIS — M6281 Muscle weakness (generalized): Secondary | ICD-10-CM | POA: Diagnosis not present

## 2021-05-08 DIAGNOSIS — G3184 Mild cognitive impairment, so stated: Secondary | ICD-10-CM | POA: Diagnosis not present

## 2021-05-08 DIAGNOSIS — Z741 Need for assistance with personal care: Secondary | ICD-10-CM | POA: Diagnosis not present

## 2021-05-08 DIAGNOSIS — F3112 Bipolar disorder, current episode manic without psychotic features, moderate: Secondary | ICD-10-CM | POA: Diagnosis not present

## 2021-05-08 DIAGNOSIS — R2689 Other abnormalities of gait and mobility: Secondary | ICD-10-CM | POA: Diagnosis not present

## 2021-05-08 DIAGNOSIS — F321 Major depressive disorder, single episode, moderate: Secondary | ICD-10-CM | POA: Diagnosis not present

## 2021-05-08 DIAGNOSIS — G4720 Circadian rhythm sleep disorder, unspecified type: Secondary | ICD-10-CM | POA: Diagnosis not present

## 2021-05-08 DIAGNOSIS — Z9181 History of falling: Secondary | ICD-10-CM | POA: Diagnosis not present

## 2021-05-09 DIAGNOSIS — M6281 Muscle weakness (generalized): Secondary | ICD-10-CM | POA: Diagnosis not present

## 2021-05-09 DIAGNOSIS — Z9181 History of falling: Secondary | ICD-10-CM | POA: Diagnosis not present

## 2021-05-09 DIAGNOSIS — R2689 Other abnormalities of gait and mobility: Secondary | ICD-10-CM | POA: Diagnosis not present

## 2021-05-09 DIAGNOSIS — Z741 Need for assistance with personal care: Secondary | ICD-10-CM | POA: Diagnosis not present

## 2021-05-10 DIAGNOSIS — Z9181 History of falling: Secondary | ICD-10-CM | POA: Diagnosis not present

## 2021-05-10 DIAGNOSIS — Z741 Need for assistance with personal care: Secondary | ICD-10-CM | POA: Diagnosis not present

## 2021-05-10 DIAGNOSIS — R2689 Other abnormalities of gait and mobility: Secondary | ICD-10-CM | POA: Diagnosis not present

## 2021-05-10 DIAGNOSIS — M6281 Muscle weakness (generalized): Secondary | ICD-10-CM | POA: Diagnosis not present

## 2021-05-11 DIAGNOSIS — M6281 Muscle weakness (generalized): Secondary | ICD-10-CM | POA: Diagnosis not present

## 2021-05-11 DIAGNOSIS — Z9181 History of falling: Secondary | ICD-10-CM | POA: Diagnosis not present

## 2021-05-11 DIAGNOSIS — R2689 Other abnormalities of gait and mobility: Secondary | ICD-10-CM | POA: Diagnosis not present

## 2021-05-11 DIAGNOSIS — Z741 Need for assistance with personal care: Secondary | ICD-10-CM | POA: Diagnosis not present

## 2021-05-13 DIAGNOSIS — M6281 Muscle weakness (generalized): Secondary | ICD-10-CM | POA: Diagnosis not present

## 2021-05-13 DIAGNOSIS — R2689 Other abnormalities of gait and mobility: Secondary | ICD-10-CM | POA: Diagnosis not present

## 2021-05-13 DIAGNOSIS — Z741 Need for assistance with personal care: Secondary | ICD-10-CM | POA: Diagnosis not present

## 2021-05-13 DIAGNOSIS — Z9181 History of falling: Secondary | ICD-10-CM | POA: Diagnosis not present

## 2021-05-14 DIAGNOSIS — Z9181 History of falling: Secondary | ICD-10-CM | POA: Diagnosis not present

## 2021-05-14 DIAGNOSIS — M6281 Muscle weakness (generalized): Secondary | ICD-10-CM | POA: Diagnosis not present

## 2021-05-14 DIAGNOSIS — Z741 Need for assistance with personal care: Secondary | ICD-10-CM | POA: Diagnosis not present

## 2021-05-14 DIAGNOSIS — R2689 Other abnormalities of gait and mobility: Secondary | ICD-10-CM | POA: Diagnosis not present

## 2021-05-15 DIAGNOSIS — Z741 Need for assistance with personal care: Secondary | ICD-10-CM | POA: Diagnosis not present

## 2021-05-15 DIAGNOSIS — M6281 Muscle weakness (generalized): Secondary | ICD-10-CM | POA: Diagnosis not present

## 2021-05-15 DIAGNOSIS — R2689 Other abnormalities of gait and mobility: Secondary | ICD-10-CM | POA: Diagnosis not present

## 2021-05-15 DIAGNOSIS — Z9181 History of falling: Secondary | ICD-10-CM | POA: Diagnosis not present

## 2021-05-16 DIAGNOSIS — Z741 Need for assistance with personal care: Secondary | ICD-10-CM | POA: Diagnosis not present

## 2021-05-16 DIAGNOSIS — M6281 Muscle weakness (generalized): Secondary | ICD-10-CM | POA: Diagnosis not present

## 2021-05-16 DIAGNOSIS — Z9181 History of falling: Secondary | ICD-10-CM | POA: Diagnosis not present

## 2021-05-16 DIAGNOSIS — R2689 Other abnormalities of gait and mobility: Secondary | ICD-10-CM | POA: Diagnosis not present

## 2021-05-17 DIAGNOSIS — M6281 Muscle weakness (generalized): Secondary | ICD-10-CM | POA: Diagnosis not present

## 2021-05-17 DIAGNOSIS — R2689 Other abnormalities of gait and mobility: Secondary | ICD-10-CM | POA: Diagnosis not present

## 2021-05-17 DIAGNOSIS — Z9181 History of falling: Secondary | ICD-10-CM | POA: Diagnosis not present

## 2021-05-17 DIAGNOSIS — Z741 Need for assistance with personal care: Secondary | ICD-10-CM | POA: Diagnosis not present

## 2021-06-11 DIAGNOSIS — G3184 Mild cognitive impairment, so stated: Secondary | ICD-10-CM | POA: Diagnosis not present

## 2021-06-11 DIAGNOSIS — F321 Major depressive disorder, single episode, moderate: Secondary | ICD-10-CM | POA: Diagnosis not present

## 2021-06-11 DIAGNOSIS — G4720 Circadian rhythm sleep disorder, unspecified type: Secondary | ICD-10-CM | POA: Diagnosis not present

## 2021-06-11 DIAGNOSIS — F3112 Bipolar disorder, current episode manic without psychotic features, moderate: Secondary | ICD-10-CM | POA: Diagnosis not present

## 2021-07-17 DIAGNOSIS — H2513 Age-related nuclear cataract, bilateral: Secondary | ICD-10-CM | POA: Diagnosis not present

## 2021-07-17 DIAGNOSIS — H401134 Primary open-angle glaucoma, bilateral, indeterminate stage: Secondary | ICD-10-CM | POA: Diagnosis not present

## 2021-07-17 DIAGNOSIS — H18413 Arcus senilis, bilateral: Secondary | ICD-10-CM | POA: Diagnosis not present

## 2021-07-30 DIAGNOSIS — I8290 Acute embolism and thrombosis of unspecified vein: Secondary | ICD-10-CM | POA: Diagnosis not present

## 2021-07-30 DIAGNOSIS — E118 Type 2 diabetes mellitus with unspecified complications: Secondary | ICD-10-CM | POA: Diagnosis not present

## 2021-07-30 DIAGNOSIS — E119 Type 2 diabetes mellitus without complications: Secondary | ICD-10-CM | POA: Diagnosis not present

## 2021-08-05 DIAGNOSIS — E118 Type 2 diabetes mellitus with unspecified complications: Secondary | ICD-10-CM | POA: Diagnosis not present

## 2021-08-05 DIAGNOSIS — Z029 Encounter for administrative examinations, unspecified: Secondary | ICD-10-CM | POA: Diagnosis not present

## 2021-08-05 DIAGNOSIS — I8290 Acute embolism and thrombosis of unspecified vein: Secondary | ICD-10-CM | POA: Diagnosis not present

## 2021-08-05 DIAGNOSIS — E119 Type 2 diabetes mellitus without complications: Secondary | ICD-10-CM | POA: Diagnosis not present

## 2021-08-06 DIAGNOSIS — F02B0 Dementia in other diseases classified elsewhere, moderate, without behavioral disturbance, psychotic disturbance, mood disturbance, and anxiety: Secondary | ICD-10-CM | POA: Diagnosis not present

## 2021-08-06 DIAGNOSIS — F3112 Bipolar disorder, current episode manic without psychotic features, moderate: Secondary | ICD-10-CM | POA: Diagnosis not present

## 2021-08-20 DIAGNOSIS — E1159 Type 2 diabetes mellitus with other circulatory complications: Secondary | ICD-10-CM | POA: Diagnosis not present

## 2021-08-20 DIAGNOSIS — B351 Tinea unguium: Secondary | ICD-10-CM | POA: Diagnosis not present

## 2021-08-21 DIAGNOSIS — R32 Unspecified urinary incontinence: Secondary | ICD-10-CM | POA: Diagnosis not present

## 2021-08-22 DIAGNOSIS — N390 Urinary tract infection, site not specified: Secondary | ICD-10-CM | POA: Diagnosis not present

## 2021-08-23 DIAGNOSIS — Z79899 Other long term (current) drug therapy: Secondary | ICD-10-CM | POA: Diagnosis not present

## 2021-08-23 DIAGNOSIS — I10 Essential (primary) hypertension: Secondary | ICD-10-CM | POA: Diagnosis not present

## 2021-08-27 DIAGNOSIS — I639 Cerebral infarction, unspecified: Secondary | ICD-10-CM | POA: Diagnosis not present

## 2021-08-27 DIAGNOSIS — E118 Type 2 diabetes mellitus with unspecified complications: Secondary | ICD-10-CM | POA: Diagnosis not present

## 2021-08-27 DIAGNOSIS — E0865 Diabetes mellitus due to underlying condition with hyperglycemia: Secondary | ICD-10-CM | POA: Diagnosis not present

## 2021-08-27 DIAGNOSIS — C787 Secondary malignant neoplasm of liver and intrahepatic bile duct: Secondary | ICD-10-CM | POA: Diagnosis not present

## 2021-08-27 DIAGNOSIS — R000 Tachycardia, unspecified: Secondary | ICD-10-CM | POA: Diagnosis not present

## 2021-08-27 DIAGNOSIS — R0602 Shortness of breath: Secondary | ICD-10-CM | POA: Diagnosis not present

## 2021-08-27 DIAGNOSIS — C7971 Secondary malignant neoplasm of right adrenal gland: Secondary | ICD-10-CM | POA: Diagnosis not present

## 2021-08-27 DIAGNOSIS — C7972 Secondary malignant neoplasm of left adrenal gland: Secondary | ICD-10-CM | POA: Diagnosis not present

## 2021-08-27 DIAGNOSIS — I959 Hypotension, unspecified: Secondary | ICD-10-CM | POA: Diagnosis not present

## 2021-08-27 DIAGNOSIS — E43 Unspecified severe protein-calorie malnutrition: Secondary | ICD-10-CM | POA: Diagnosis not present

## 2021-08-27 DIAGNOSIS — I10 Essential (primary) hypertension: Secondary | ICD-10-CM | POA: Diagnosis not present

## 2021-08-27 DIAGNOSIS — R942 Abnormal results of pulmonary function studies: Secondary | ICD-10-CM | POA: Diagnosis not present

## 2021-08-27 DIAGNOSIS — R1011 Right upper quadrant pain: Secondary | ICD-10-CM | POA: Diagnosis not present

## 2021-08-27 DIAGNOSIS — Z66 Do not resuscitate: Secondary | ICD-10-CM | POA: Diagnosis not present

## 2021-08-27 DIAGNOSIS — R404 Transient alteration of awareness: Secondary | ICD-10-CM | POA: Diagnosis not present

## 2021-08-27 DIAGNOSIS — R739 Hyperglycemia, unspecified: Secondary | ICD-10-CM | POA: Diagnosis not present

## 2021-08-27 DIAGNOSIS — R509 Fever, unspecified: Secondary | ICD-10-CM | POA: Diagnosis not present

## 2021-08-27 DIAGNOSIS — R0902 Hypoxemia: Secondary | ICD-10-CM | POA: Diagnosis not present

## 2021-08-27 DIAGNOSIS — J189 Pneumonia, unspecified organism: Secondary | ICD-10-CM | POA: Diagnosis not present

## 2021-08-27 DIAGNOSIS — R0689 Other abnormalities of breathing: Secondary | ICD-10-CM | POA: Diagnosis not present

## 2021-08-27 DIAGNOSIS — R32 Unspecified urinary incontinence: Secondary | ICD-10-CM | POA: Diagnosis not present

## 2021-08-27 DIAGNOSIS — Z7401 Bed confinement status: Secondary | ICD-10-CM | POA: Diagnosis not present

## 2021-08-27 DIAGNOSIS — R296 Repeated falls: Secondary | ICD-10-CM | POA: Diagnosis not present

## 2021-08-27 DIAGNOSIS — E873 Alkalosis: Secondary | ICD-10-CM | POA: Diagnosis not present

## 2021-08-27 DIAGNOSIS — R935 Abnormal findings on diagnostic imaging of other abdominal regions, including retroperitoneum: Secondary | ICD-10-CM | POA: Diagnosis not present

## 2021-08-27 DIAGNOSIS — A419 Sepsis, unspecified organism: Secondary | ICD-10-CM | POA: Diagnosis not present

## 2021-08-27 DIAGNOSIS — G9341 Metabolic encephalopathy: Secondary | ICD-10-CM | POA: Diagnosis not present

## 2021-08-28 DIAGNOSIS — J189 Pneumonia, unspecified organism: Secondary | ICD-10-CM | POA: Diagnosis not present

## 2021-08-28 DIAGNOSIS — A419 Sepsis, unspecified organism: Secondary | ICD-10-CM | POA: Diagnosis not present

## 2021-08-29 DIAGNOSIS — A419 Sepsis, unspecified organism: Secondary | ICD-10-CM | POA: Diagnosis not present

## 2021-08-29 DIAGNOSIS — J189 Pneumonia, unspecified organism: Secondary | ICD-10-CM | POA: Diagnosis not present

## 2021-08-30 DIAGNOSIS — I959 Hypotension, unspecified: Secondary | ICD-10-CM | POA: Diagnosis not present

## 2021-08-30 DIAGNOSIS — R0902 Hypoxemia: Secondary | ICD-10-CM | POA: Diagnosis not present

## 2021-08-30 DIAGNOSIS — Z7401 Bed confinement status: Secondary | ICD-10-CM | POA: Diagnosis not present

## 2021-08-30 DIAGNOSIS — A419 Sepsis, unspecified organism: Secondary | ICD-10-CM | POA: Diagnosis not present

## 2021-08-30 DIAGNOSIS — J189 Pneumonia, unspecified organism: Secondary | ICD-10-CM | POA: Diagnosis not present

## 2021-08-30 DIAGNOSIS — R404 Transient alteration of awareness: Secondary | ICD-10-CM | POA: Diagnosis not present

## 2021-09-28 DEATH — deceased
